# Patient Record
Sex: Male | Born: 1945 | Race: White | Hispanic: No | Smoking: Former smoker
Health system: Southern US, Community
[De-identification: ages and names within clinical notes are randomized; demographics above are authoritative.]

## PROBLEM LIST (undated history)

## (undated) DIAGNOSIS — I10 Essential (primary) hypertension: Secondary | ICD-10-CM

## (undated) DIAGNOSIS — I2510 Atherosclerotic heart disease of native coronary artery without angina pectoris: Secondary | ICD-10-CM

## (undated) DIAGNOSIS — E871 Hypo-osmolality and hyponatremia: Secondary | ICD-10-CM

## (undated) DIAGNOSIS — J449 Chronic obstructive pulmonary disease, unspecified: Secondary | ICD-10-CM

## (undated) DIAGNOSIS — R7989 Other specified abnormal findings of blood chemistry: Secondary | ICD-10-CM

## (undated) DIAGNOSIS — R040 Epistaxis: Secondary | ICD-10-CM

## (undated) DIAGNOSIS — K5790 Diverticulosis of intestine, part unspecified, without perforation or abscess without bleeding: Secondary | ICD-10-CM

## (undated) DIAGNOSIS — N200 Calculus of kidney: Secondary | ICD-10-CM

## (undated) DIAGNOSIS — Z7901 Long term (current) use of anticoagulants: Secondary | ICD-10-CM

## (undated) DIAGNOSIS — F1010 Alcohol abuse, uncomplicated: Secondary | ICD-10-CM

## (undated) DIAGNOSIS — D589 Hereditary hemolytic anemia, unspecified: Secondary | ICD-10-CM

## (undated) DIAGNOSIS — I5032 Chronic diastolic (congestive) heart failure: Secondary | ICD-10-CM

## (undated) DIAGNOSIS — I38 Endocarditis, valve unspecified: Secondary | ICD-10-CM

## (undated) DIAGNOSIS — N189 Chronic kidney disease, unspecified: Secondary | ICD-10-CM

## (undated) DIAGNOSIS — N183 Chronic kidney disease, stage 3 unspecified: Secondary | ICD-10-CM

## (undated) DIAGNOSIS — R945 Abnormal results of liver function studies: Secondary | ICD-10-CM

## (undated) DIAGNOSIS — I4891 Unspecified atrial fibrillation: Secondary | ICD-10-CM

## (undated) DIAGNOSIS — R7301 Impaired fasting glucose: Secondary | ICD-10-CM

## (undated) DIAGNOSIS — E785 Hyperlipidemia, unspecified: Secondary | ICD-10-CM

## (undated) DIAGNOSIS — Z720 Tobacco use: Secondary | ICD-10-CM

## (undated) DIAGNOSIS — K219 Gastro-esophageal reflux disease without esophagitis: Secondary | ICD-10-CM

## (undated) HISTORY — DX: Hereditary hemolytic anemia, unspecified: D58.9

## (undated) HISTORY — PX: INGUINAL HERNIA REPAIR: SHX194

## (undated) HISTORY — DX: Epistaxis: R04.0

## (undated) HISTORY — DX: Chronic kidney disease, unspecified: N18.9

## (undated) HISTORY — PX: OTHER SURGICAL HISTORY: SHX169

## (undated) HISTORY — DX: Tobacco use: Z72.0

## (undated) HISTORY — DX: Diverticulosis of intestine, part unspecified, without perforation or abscess without bleeding: K57.90

## (undated) HISTORY — DX: Other specified abnormal findings of blood chemistry: R79.89

## (undated) HISTORY — DX: Long term (current) use of anticoagulants: Z79.01

## (undated) HISTORY — DX: Endocarditis, valve unspecified: I38

## (undated) HISTORY — DX: Abnormal results of liver function studies: R94.5

## (undated) HISTORY — DX: Alcohol abuse, uncomplicated: F10.10

## (undated) HISTORY — DX: Impaired fasting glucose: R73.01

## (undated) HISTORY — DX: Hypo-osmolality and hyponatremia: E87.1

## (undated) HISTORY — DX: Calculus of kidney: N20.0

## (undated) HISTORY — PX: COLONOSCOPY: SHX174

---

## 2001-08-18 ENCOUNTER — Emergency Department (HOSPITAL_COMMUNITY): Admission: EM | Admit: 2001-08-18 | Discharge: 2001-08-18 | Payer: Self-pay | Admitting: Emergency Medicine

## 2001-08-19 ENCOUNTER — Emergency Department (HOSPITAL_COMMUNITY): Admission: EM | Admit: 2001-08-19 | Discharge: 2001-08-19 | Payer: Self-pay | Admitting: *Deleted

## 2001-10-31 ENCOUNTER — Ambulatory Visit (HOSPITAL_COMMUNITY): Admission: RE | Admit: 2001-10-31 | Discharge: 2001-10-31 | Payer: Self-pay | Admitting: Cardiology

## 2001-10-31 ENCOUNTER — Encounter: Payer: Self-pay | Admitting: Cardiology

## 2001-11-02 ENCOUNTER — Encounter (INDEPENDENT_AMBULATORY_CARE_PROVIDER_SITE_OTHER): Payer: Self-pay | Admitting: Specialist

## 2001-11-02 ENCOUNTER — Inpatient Hospital Stay (HOSPITAL_COMMUNITY): Admission: AD | Admit: 2001-11-02 | Discharge: 2001-11-16 | Payer: Self-pay | Admitting: *Deleted

## 2001-11-03 HISTORY — PX: CARDIAC VALVE REPLACEMENT: SHX585

## 2001-11-03 HISTORY — PX: CORONARY ARTERY BYPASS GRAFT: SHX141

## 2001-11-08 ENCOUNTER — Encounter (HOSPITAL_COMMUNITY): Payer: Self-pay | Admitting: Dentistry

## 2001-11-09 ENCOUNTER — Encounter: Payer: Self-pay | Admitting: Surgery

## 2001-11-10 ENCOUNTER — Encounter: Payer: Self-pay | Admitting: Surgery

## 2001-11-11 ENCOUNTER — Encounter: Payer: Self-pay | Admitting: Surgery

## 2001-11-12 ENCOUNTER — Encounter: Payer: Self-pay | Admitting: Surgery

## 2001-11-13 ENCOUNTER — Encounter: Payer: Self-pay | Admitting: Surgery

## 2001-11-14 ENCOUNTER — Encounter: Payer: Self-pay | Admitting: Surgery

## 2001-12-08 ENCOUNTER — Ambulatory Visit (HOSPITAL_COMMUNITY): Admission: RE | Admit: 2001-12-08 | Discharge: 2001-12-08 | Payer: Self-pay | Admitting: Cardiology

## 2001-12-19 ENCOUNTER — Encounter (HOSPITAL_COMMUNITY): Admission: RE | Admit: 2001-12-19 | Discharge: 2002-01-18 | Payer: Self-pay | Admitting: Internal Medicine

## 2002-01-03 HISTORY — PX: ESOPHAGOGASTRODUODENOSCOPY: SHX1529

## 2002-01-20 ENCOUNTER — Encounter (HOSPITAL_COMMUNITY): Admission: RE | Admit: 2002-01-20 | Discharge: 2002-02-19 | Payer: Self-pay | Admitting: Internal Medicine

## 2002-01-28 ENCOUNTER — Inpatient Hospital Stay (HOSPITAL_COMMUNITY): Admission: EM | Admit: 2002-01-28 | Discharge: 2002-02-03 | Payer: Self-pay | Admitting: Emergency Medicine

## 2002-01-28 ENCOUNTER — Encounter: Payer: Self-pay | Admitting: Emergency Medicine

## 2002-03-05 ENCOUNTER — Emergency Department (HOSPITAL_COMMUNITY): Admission: EM | Admit: 2002-03-05 | Discharge: 2002-03-05 | Payer: Self-pay | Admitting: Internal Medicine

## 2002-04-05 ENCOUNTER — Ambulatory Visit (HOSPITAL_COMMUNITY): Admission: RE | Admit: 2002-04-05 | Discharge: 2002-04-05 | Payer: Self-pay | Admitting: Internal Medicine

## 2002-04-10 ENCOUNTER — Ambulatory Visit (HOSPITAL_COMMUNITY): Admission: RE | Admit: 2002-04-10 | Discharge: 2002-04-10 | Payer: Self-pay | Admitting: Cardiology

## 2002-10-04 ENCOUNTER — Ambulatory Visit (HOSPITAL_COMMUNITY): Admission: RE | Admit: 2002-10-04 | Discharge: 2002-10-04 | Payer: Self-pay | Admitting: *Deleted

## 2003-07-03 ENCOUNTER — Ambulatory Visit (HOSPITAL_COMMUNITY): Admission: RE | Admit: 2003-07-03 | Discharge: 2003-07-03 | Payer: Self-pay | Admitting: Cardiology

## 2003-07-10 ENCOUNTER — Encounter: Admission: RE | Admit: 2003-07-10 | Discharge: 2003-07-10 | Payer: Self-pay | Admitting: Oncology

## 2003-07-10 ENCOUNTER — Encounter (HOSPITAL_COMMUNITY): Admission: RE | Admit: 2003-07-10 | Discharge: 2003-08-09 | Payer: Self-pay | Admitting: Oncology

## 2003-08-13 ENCOUNTER — Encounter (HOSPITAL_COMMUNITY): Admission: RE | Admit: 2003-08-13 | Discharge: 2003-09-12 | Payer: Self-pay | Admitting: Oncology

## 2003-08-13 ENCOUNTER — Encounter: Admission: RE | Admit: 2003-08-13 | Discharge: 2003-08-13 | Payer: Self-pay | Admitting: Oncology

## 2004-06-02 ENCOUNTER — Ambulatory Visit: Payer: Self-pay | Admitting: *Deleted

## 2004-06-17 ENCOUNTER — Ambulatory Visit: Payer: Self-pay | Admitting: Cardiology

## 2004-07-02 ENCOUNTER — Ambulatory Visit: Payer: Self-pay | Admitting: *Deleted

## 2004-11-04 ENCOUNTER — Ambulatory Visit: Payer: Self-pay | Admitting: *Deleted

## 2004-12-03 ENCOUNTER — Ambulatory Visit: Payer: Self-pay | Admitting: *Deleted

## 2004-12-10 ENCOUNTER — Ambulatory Visit: Payer: Self-pay | Admitting: Cardiology

## 2005-01-13 ENCOUNTER — Ambulatory Visit: Payer: Self-pay | Admitting: Cardiology

## 2005-02-16 ENCOUNTER — Ambulatory Visit: Payer: Self-pay | Admitting: Cardiology

## 2005-03-13 ENCOUNTER — Observation Stay (HOSPITAL_COMMUNITY): Admission: EM | Admit: 2005-03-13 | Discharge: 2005-03-14 | Payer: Self-pay | Admitting: Emergency Medicine

## 2005-03-16 ENCOUNTER — Ambulatory Visit: Payer: Self-pay | Admitting: *Deleted

## 2005-04-17 ENCOUNTER — Ambulatory Visit: Payer: Self-pay | Admitting: Cardiovascular Disease

## 2005-05-04 ENCOUNTER — Ambulatory Visit: Payer: Self-pay | Admitting: *Deleted

## 2005-05-04 ENCOUNTER — Ambulatory Visit: Payer: Self-pay | Admitting: Internal Medicine

## 2005-05-12 ENCOUNTER — Ambulatory Visit: Payer: Self-pay | Admitting: Internal Medicine

## 2005-05-29 ENCOUNTER — Ambulatory Visit: Payer: Self-pay | Admitting: *Deleted

## 2005-06-05 ENCOUNTER — Ambulatory Visit: Payer: Self-pay | Admitting: *Deleted

## 2005-06-19 ENCOUNTER — Ambulatory Visit: Payer: Self-pay | Admitting: *Deleted

## 2005-07-01 ENCOUNTER — Ambulatory Visit: Payer: Self-pay | Admitting: Urgent Care

## 2005-07-24 ENCOUNTER — Ambulatory Visit: Payer: Self-pay | Admitting: *Deleted

## 2005-08-27 ENCOUNTER — Ambulatory Visit: Payer: Self-pay | Admitting: Cardiology

## 2005-09-10 ENCOUNTER — Ambulatory Visit (HOSPITAL_COMMUNITY): Admission: RE | Admit: 2005-09-10 | Discharge: 2005-09-10 | Payer: Self-pay | Admitting: Internal Medicine

## 2005-09-25 ENCOUNTER — Ambulatory Visit: Payer: Self-pay | Admitting: *Deleted

## 2005-10-15 ENCOUNTER — Ambulatory Visit: Payer: Self-pay | Admitting: Cardiology

## 2005-11-12 ENCOUNTER — Ambulatory Visit: Payer: Self-pay | Admitting: *Deleted

## 2005-12-09 ENCOUNTER — Ambulatory Visit: Payer: Self-pay | Admitting: *Deleted

## 2006-01-08 ENCOUNTER — Ambulatory Visit: Payer: Self-pay | Admitting: *Deleted

## 2006-02-04 ENCOUNTER — Ambulatory Visit: Payer: Self-pay | Admitting: *Deleted

## 2006-03-11 ENCOUNTER — Ambulatory Visit: Payer: Self-pay | Admitting: Cardiology

## 2006-04-13 ENCOUNTER — Ambulatory Visit: Payer: Self-pay | Admitting: Cardiology

## 2006-05-18 ENCOUNTER — Ambulatory Visit: Payer: Self-pay | Admitting: Cardiology

## 2006-06-01 ENCOUNTER — Ambulatory Visit: Payer: Self-pay | Admitting: Cardiovascular Disease

## 2006-07-01 ENCOUNTER — Ambulatory Visit: Payer: Self-pay | Admitting: Cardiology

## 2006-08-05 ENCOUNTER — Ambulatory Visit: Payer: Self-pay | Admitting: Cardiology

## 2006-09-06 ENCOUNTER — Ambulatory Visit: Payer: Self-pay | Admitting: Cardiology

## 2006-09-23 ENCOUNTER — Ambulatory Visit: Payer: Self-pay | Admitting: Internal Medicine

## 2006-10-04 ENCOUNTER — Ambulatory Visit: Payer: Self-pay | Admitting: Internal Medicine

## 2006-10-11 ENCOUNTER — Ambulatory Visit: Payer: Self-pay | Admitting: Internal Medicine

## 2006-10-13 ENCOUNTER — Emergency Department (HOSPITAL_COMMUNITY): Admission: EM | Admit: 2006-10-13 | Discharge: 2006-10-13 | Payer: Self-pay | Admitting: Emergency Medicine

## 2006-11-10 ENCOUNTER — Ambulatory Visit: Payer: Self-pay | Admitting: Cardiovascular Disease

## 2006-12-08 ENCOUNTER — Ambulatory Visit: Payer: Self-pay | Admitting: Cardiology

## 2006-12-23 ENCOUNTER — Ambulatory Visit: Payer: Self-pay | Admitting: Cardiovascular Disease

## 2007-01-18 ENCOUNTER — Ambulatory Visit: Payer: Self-pay | Admitting: Internal Medicine

## 2007-01-20 ENCOUNTER — Ambulatory Visit: Payer: Self-pay | Admitting: Cardiology

## 2007-01-27 ENCOUNTER — Ambulatory Visit: Payer: Self-pay | Admitting: Cardiology

## 2007-02-10 ENCOUNTER — Ambulatory Visit: Payer: Self-pay | Admitting: Cardiology

## 2007-02-24 ENCOUNTER — Ambulatory Visit: Payer: Self-pay | Admitting: Cardiology

## 2007-03-08 ENCOUNTER — Ambulatory Visit: Payer: Self-pay | Admitting: Cardiovascular Disease

## 2007-04-08 ENCOUNTER — Ambulatory Visit: Payer: Self-pay | Admitting: Cardiology

## 2007-05-10 ENCOUNTER — Ambulatory Visit: Payer: Self-pay | Admitting: Cardiology

## 2007-05-25 ENCOUNTER — Ambulatory Visit: Payer: Self-pay | Admitting: Cardiology

## 2007-06-22 ENCOUNTER — Ambulatory Visit: Payer: Self-pay | Admitting: Cardiology

## 2007-07-21 ENCOUNTER — Ambulatory Visit: Payer: Self-pay | Admitting: Cardiology

## 2007-08-11 ENCOUNTER — Ambulatory Visit: Payer: Self-pay | Admitting: Cardiology

## 2007-10-07 ENCOUNTER — Ambulatory Visit: Payer: Self-pay | Admitting: Cardiology

## 2007-11-10 ENCOUNTER — Ambulatory Visit: Payer: Self-pay | Admitting: Cardiology

## 2007-12-08 ENCOUNTER — Ambulatory Visit: Payer: Self-pay | Admitting: Cardiology

## 2008-01-09 ENCOUNTER — Ambulatory Visit: Payer: Self-pay | Admitting: Cardiology

## 2008-02-03 ENCOUNTER — Ambulatory Visit: Payer: Self-pay | Admitting: Cardiology

## 2008-02-20 ENCOUNTER — Encounter: Payer: Self-pay | Admitting: Cardiology

## 2008-02-20 LAB — CONVERTED CEMR LAB
HCT: 39.1 %
Hemoglobin: 13.3 g/dL
MCV: 91 fL
Platelets: 338 10*3/uL
WBC: 7.5 10*3/uL

## 2008-03-05 ENCOUNTER — Ambulatory Visit: Payer: Self-pay | Admitting: Cardiology

## 2008-03-23 ENCOUNTER — Ambulatory Visit: Payer: Self-pay | Admitting: Cardiology

## 2008-04-02 ENCOUNTER — Ambulatory Visit: Payer: Self-pay | Admitting: Cardiology

## 2008-05-03 ENCOUNTER — Ambulatory Visit: Payer: Self-pay | Admitting: Cardiology

## 2008-05-14 ENCOUNTER — Ambulatory Visit: Payer: Self-pay | Admitting: Cardiology

## 2008-05-17 ENCOUNTER — Encounter: Payer: Self-pay | Admitting: Cardiology

## 2008-05-17 LAB — CONVERTED CEMR LAB
Alkaline Phosphatase: 127 units/L
CO2: 24 meq/L
Chloride: 97 meq/L
Cholesterol: 128 mg/dL
Glucose, Bld: 89 mg/dL
HDL: 47 mg/dL
LDL (calc): 62 mg/dL
Potassium: 5 meq/L
Sodium: 133 meq/L
Triglyceride fasting, serum: 95 mg/dL

## 2008-05-21 ENCOUNTER — Ambulatory Visit: Payer: Self-pay | Admitting: Cardiology

## 2008-06-11 ENCOUNTER — Ambulatory Visit: Payer: Self-pay | Admitting: Cardiology

## 2008-07-02 ENCOUNTER — Ambulatory Visit: Payer: Self-pay | Admitting: Cardiology

## 2008-07-30 ENCOUNTER — Ambulatory Visit: Payer: Self-pay | Admitting: Cardiology

## 2008-08-27 ENCOUNTER — Ambulatory Visit: Payer: Self-pay | Admitting: Cardiology

## 2008-09-27 ENCOUNTER — Ambulatory Visit: Payer: Self-pay

## 2008-11-01 ENCOUNTER — Ambulatory Visit: Payer: Self-pay | Admitting: Cardiology

## 2008-11-29 ENCOUNTER — Ambulatory Visit: Payer: Self-pay | Admitting: Cardiology

## 2008-12-20 ENCOUNTER — Ambulatory Visit: Payer: Self-pay | Admitting: Cardiology

## 2009-01-03 ENCOUNTER — Ambulatory Visit: Payer: Self-pay | Admitting: Cardiology

## 2009-01-24 ENCOUNTER — Ambulatory Visit: Payer: Self-pay | Admitting: Cardiology

## 2009-02-14 ENCOUNTER — Ambulatory Visit: Payer: Self-pay

## 2009-02-14 ENCOUNTER — Encounter: Payer: Self-pay | Admitting: Cardiology

## 2009-02-18 ENCOUNTER — Encounter: Payer: Self-pay | Admitting: *Deleted

## 2009-03-07 ENCOUNTER — Ambulatory Visit: Payer: Self-pay | Admitting: Cardiology

## 2009-03-07 LAB — CONVERTED CEMR LAB: POC INR: 3.5

## 2009-03-08 ENCOUNTER — Encounter: Payer: Self-pay | Admitting: Cardiology

## 2009-04-08 ENCOUNTER — Ambulatory Visit: Payer: Self-pay | Admitting: Cardiology

## 2009-04-08 LAB — CONVERTED CEMR LAB: POC INR: 2.8

## 2009-05-06 ENCOUNTER — Ambulatory Visit: Payer: Self-pay | Admitting: Cardiology

## 2009-05-06 LAB — CONVERTED CEMR LAB: POC INR: 4.2

## 2009-05-22 ENCOUNTER — Ambulatory Visit: Payer: Self-pay | Admitting: Cardiology

## 2009-05-22 LAB — CONVERTED CEMR LAB: POC INR: 4.1

## 2009-06-13 ENCOUNTER — Ambulatory Visit: Payer: Self-pay | Admitting: Cardiology

## 2009-06-13 LAB — CONVERTED CEMR LAB: POC INR: 2.2

## 2009-07-10 ENCOUNTER — Ambulatory Visit: Payer: Self-pay | Admitting: Cardiology

## 2009-07-10 LAB — CONVERTED CEMR LAB: POC INR: 4.2

## 2009-07-23 ENCOUNTER — Emergency Department (HOSPITAL_COMMUNITY): Admission: EM | Admit: 2009-07-23 | Discharge: 2009-07-24 | Payer: Self-pay | Admitting: Emergency Medicine

## 2009-07-24 ENCOUNTER — Emergency Department (HOSPITAL_COMMUNITY): Admission: EM | Admit: 2009-07-24 | Discharge: 2009-07-24 | Payer: Self-pay | Admitting: Emergency Medicine

## 2009-07-26 ENCOUNTER — Emergency Department (HOSPITAL_COMMUNITY): Admission: EM | Admit: 2009-07-26 | Discharge: 2009-07-26 | Payer: Self-pay | Admitting: Emergency Medicine

## 2009-07-31 ENCOUNTER — Ambulatory Visit: Payer: Self-pay | Admitting: Cardiology

## 2009-07-31 LAB — CONVERTED CEMR LAB: POC INR: 2.4

## 2009-08-01 ENCOUNTER — Emergency Department (HOSPITAL_COMMUNITY): Admission: EM | Admit: 2009-08-01 | Discharge: 2009-08-01 | Payer: Self-pay | Admitting: Emergency Medicine

## 2009-08-01 ENCOUNTER — Ambulatory Visit: Payer: Self-pay | Admitting: Cardiology

## 2009-08-01 ENCOUNTER — Inpatient Hospital Stay (HOSPITAL_COMMUNITY): Admission: EM | Admit: 2009-08-01 | Discharge: 2009-08-03 | Payer: Self-pay | Admitting: Emergency Medicine

## 2009-08-02 ENCOUNTER — Encounter (INDEPENDENT_AMBULATORY_CARE_PROVIDER_SITE_OTHER): Payer: Self-pay | Admitting: *Deleted

## 2009-08-02 LAB — CONVERTED CEMR LAB
BUN: 16 mg/dL
CO2: 25 meq/L
Calcium: 8.8 mg/dL
Chloride: 100 meq/L
Creatinine, Ser: 0.83 mg/dL
GFR calc non Af Amer: >60 mL/min
Glomerular Filtration Rate, Af Am: >60 mL/min/{1.73_m2}
Glucose, Bld: 96 mg/dL
Potassium: 3.7 meq/L
Sodium: 132 meq/L

## 2009-08-03 ENCOUNTER — Encounter (INDEPENDENT_AMBULATORY_CARE_PROVIDER_SITE_OTHER): Payer: Self-pay | Admitting: *Deleted

## 2009-08-03 LAB — CONVERTED CEMR LAB
BUN: 10 mg/dL
CO2: 25 meq/L
Calcium: 8.5 mg/dL
Chloride: 101 meq/L
Creatinine, Ser: 0.84 mg/dL
GFR calc non Af Amer: >60 mL/min
Glomerular Filtration Rate, Af Am: >60 mL/min/{1.73_m2}
Glucose, Bld: 107 mg/dL
Magnesium: 1.1 mg/dL
Potassium: 3.6 meq/L
Sodium: 133 meq/L

## 2009-08-07 ENCOUNTER — Ambulatory Visit: Payer: Self-pay | Admitting: Cardiology

## 2009-08-07 ENCOUNTER — Encounter (INDEPENDENT_AMBULATORY_CARE_PROVIDER_SITE_OTHER): Payer: Self-pay | Admitting: *Deleted

## 2009-08-07 LAB — CONVERTED CEMR LAB: POC INR: 1.8

## 2009-08-09 ENCOUNTER — Ambulatory Visit: Payer: Self-pay | Admitting: Cardiology

## 2009-08-09 LAB — CONVERTED CEMR LAB: POC INR: 2.2

## 2009-08-12 ENCOUNTER — Ambulatory Visit: Payer: Self-pay | Admitting: Cardiology

## 2009-08-12 LAB — CONVERTED CEMR LAB: POC INR: 2.9

## 2009-08-19 ENCOUNTER — Ambulatory Visit: Payer: Self-pay | Admitting: Cardiology

## 2009-08-19 ENCOUNTER — Encounter: Payer: Self-pay | Admitting: Adult Health

## 2009-08-19 LAB — CONVERTED CEMR LAB: POC INR: 1.8

## 2009-08-22 ENCOUNTER — Ambulatory Visit: Payer: Self-pay | Admitting: Cardiology

## 2009-08-22 LAB — CONVERTED CEMR LAB: POC INR: 2.8

## 2009-09-04 ENCOUNTER — Other Ambulatory Visit: Payer: Self-pay | Admitting: Emergency Medicine

## 2009-09-04 ENCOUNTER — Ambulatory Visit: Payer: Self-pay | Admitting: Internal Medicine

## 2009-09-04 ENCOUNTER — Encounter (INDEPENDENT_AMBULATORY_CARE_PROVIDER_SITE_OTHER): Payer: Self-pay | Admitting: *Deleted

## 2009-09-04 ENCOUNTER — Inpatient Hospital Stay (HOSPITAL_COMMUNITY): Admission: AD | Admit: 2009-09-04 | Discharge: 2009-09-05 | Payer: Self-pay | Admitting: Internal Medicine

## 2009-09-04 ENCOUNTER — Encounter: Payer: Self-pay | Admitting: Internal Medicine

## 2009-09-05 ENCOUNTER — Encounter: Payer: Self-pay | Admitting: Internal Medicine

## 2009-09-06 ENCOUNTER — Telehealth (INDEPENDENT_AMBULATORY_CARE_PROVIDER_SITE_OTHER): Payer: Self-pay | Admitting: *Deleted

## 2009-09-09 ENCOUNTER — Ambulatory Visit: Payer: Self-pay | Admitting: Cardiology

## 2009-09-09 LAB — CONVERTED CEMR LAB: POC INR: 2.1

## 2009-09-18 ENCOUNTER — Ambulatory Visit: Payer: Self-pay | Admitting: Cardiology

## 2009-09-18 LAB — CONVERTED CEMR LAB: POC INR: 3

## 2009-10-10 ENCOUNTER — Ambulatory Visit: Payer: Self-pay | Admitting: Cardiology

## 2009-10-10 LAB — CONVERTED CEMR LAB: POC INR: 2.4

## 2009-11-13 ENCOUNTER — Ambulatory Visit: Payer: Self-pay | Admitting: Cardiology

## 2009-11-13 LAB — CONVERTED CEMR LAB: POC INR: 2.5

## 2009-12-11 ENCOUNTER — Ambulatory Visit: Payer: Self-pay | Admitting: Cardiology

## 2009-12-11 LAB — CONVERTED CEMR LAB: POC INR: 4.1

## 2010-01-02 ENCOUNTER — Ambulatory Visit: Payer: Self-pay | Admitting: Cardiology

## 2010-01-02 LAB — CONVERTED CEMR LAB: POC INR: 1.9

## 2010-01-22 ENCOUNTER — Ambulatory Visit: Payer: Self-pay | Admitting: Cardiology

## 2010-01-22 LAB — CONVERTED CEMR LAB: POC INR: 2.5

## 2010-02-20 ENCOUNTER — Ambulatory Visit: Payer: Self-pay | Admitting: Cardiology

## 2010-02-20 DIAGNOSIS — Z952 Presence of prosthetic heart valve: Secondary | ICD-10-CM

## 2010-02-20 DIAGNOSIS — D649 Anemia, unspecified: Secondary | ICD-10-CM

## 2010-02-20 DIAGNOSIS — E785 Hyperlipidemia, unspecified: Secondary | ICD-10-CM

## 2010-02-20 DIAGNOSIS — K5730 Diverticulosis of large intestine without perforation or abscess without bleeding: Secondary | ICD-10-CM | POA: Insufficient documentation

## 2010-02-20 DIAGNOSIS — F17200 Nicotine dependence, unspecified, uncomplicated: Secondary | ICD-10-CM

## 2010-02-20 DIAGNOSIS — Z9889 Other specified postprocedural states: Secondary | ICD-10-CM

## 2010-02-20 DIAGNOSIS — J449 Chronic obstructive pulmonary disease, unspecified: Secondary | ICD-10-CM | POA: Insufficient documentation

## 2010-02-20 LAB — CONVERTED CEMR LAB: POC INR: 2.9

## 2010-02-24 ENCOUNTER — Encounter: Payer: Self-pay | Admitting: Cardiology

## 2010-02-24 ENCOUNTER — Encounter (INDEPENDENT_AMBULATORY_CARE_PROVIDER_SITE_OTHER): Payer: Self-pay | Admitting: *Deleted

## 2010-02-24 LAB — CONVERTED CEMR LAB
ALT: 14 units/L
AST: 26 units/L
Albumin: 4.7 g/dL
Alkaline Phosphatase: 112 units/L
BUN: 17 mg/dL
Basophils Absolute: 0 10*3/uL
Basophils Relative: 1 %
CO2: 24 meq/L
Calcium: 9.6 mg/dL
Chloride: 96 meq/L
Cholesterol: 191 mg/dL
Creatinine, Ser: 1 mg/dL
Eosinophils Absolute: 0.6 10*3/uL
Eosinophils Relative: 8 %
Glucose, Bld: 104 mg/dL
HCT: 35.3 %
HDL: 71 mg/dL
Hemoglobin: 12 g/dL
LDL Cholesterol: 14 mg/dL
Lymphocytes Relative: 10 %
Lymphs Abs: 0.7 10*3/uL
MCHC: 34 g/dL
MCV: 93.1 fL
Monocytes Absolute: 0.8 10*3/uL
Monocytes Relative: 11 %
Platelets: 324 10*3/uL
Potassium: 5.2 meq/L
RBC: 3.79 M/uL
RDW: 14 %
Sodium: 130 meq/L
Total Protein: 7.6 g/dL
Triglycerides: 72 mg/dL
WBC: 7.1 10*3/uL

## 2010-02-26 ENCOUNTER — Encounter (INDEPENDENT_AMBULATORY_CARE_PROVIDER_SITE_OTHER): Payer: Self-pay | Admitting: *Deleted

## 2010-02-26 LAB — CONVERTED CEMR LAB
ALT: 14 units/L (ref 0–53)
AST: 26 units/L (ref 0–37)
Albumin: 4.7 g/dL (ref 3.5–5.2)
Alkaline Phosphatase: 112 units/L (ref 39–117)
BUN: 17 mg/dL (ref 6–23)
Basophils Absolute: 0 10*3/uL (ref 0.0–0.1)
Basophils Relative: 1 % (ref 0–1)
CO2: 24 meq/L (ref 19–32)
Calcium: 9.6 mg/dL (ref 8.4–10.5)
Chloride: 96 meq/L (ref 96–112)
Cholesterol: 191 mg/dL (ref 0–200)
Creatinine, Ser: 1 mg/dL (ref 0.40–1.50)
Eosinophils Absolute: 0.6 10*3/uL (ref 0.0–0.7)
Eosinophils Relative: 8 % — ABNORMAL HIGH (ref 0–5)
Glucose, Bld: 104 mg/dL — ABNORMAL HIGH (ref 70–99)
HCT: 35.3 % — ABNORMAL LOW (ref 39.0–52.0)
HDL: 71 mg/dL (ref >39)
Hemoglobin: 12 g/dL — ABNORMAL LOW (ref 13.0–17.0)
LDL Cholesterol: 106 mg/dL — ABNORMAL HIGH (ref 0–99)
Lymphocytes Relative: 10 % — ABNORMAL LOW (ref 12–46)
Lymphs Abs: 0.7 10*3/uL (ref 0.7–4.0)
MCHC: 34 g/dL (ref 30.0–36.0)
MCV: 93.1 fL (ref 78.0–100.0)
Monocytes Absolute: 0.8 10*3/uL (ref 0.1–1.0)
Monocytes Relative: 11 % (ref 3–12)
Neutro Abs: 5.1 10*3/uL (ref 1.7–7.7)
Neutrophils Relative %: 71 % (ref 43–77)
Platelets: 324 10*3/uL (ref 150–400)
Potassium: 5.2 meq/L (ref 3.5–5.3)
RBC: 3.79 M/uL — ABNORMAL LOW (ref 4.22–5.81)
RDW: 14 % (ref 11.5–15.5)
Sodium: 130 meq/L — ABNORMAL LOW (ref 135–145)
Total Bilirubin: 0.5 mg/dL (ref 0.3–1.2)
Total CHOL/HDL Ratio: 2.7
Total Protein: 7.6 g/dL (ref 6.0–8.3)
Triglycerides: 72 mg/dL (ref <150)
VLDL: 14 mg/dL (ref 0–40)
WBC: 7.1 10*3/uL (ref 4.0–10.5)

## 2010-03-20 ENCOUNTER — Ambulatory Visit: Payer: Self-pay | Admitting: Cardiology

## 2010-03-20 LAB — CONVERTED CEMR LAB: POC INR: 2.4

## 2010-03-21 ENCOUNTER — Encounter (INDEPENDENT_AMBULATORY_CARE_PROVIDER_SITE_OTHER): Payer: Self-pay | Admitting: *Deleted

## 2010-03-21 LAB — CONVERTED CEMR LAB
OCCULT 1: NEGATIVE
OCCULT 2: NEGATIVE
OCCULT 3: NEGATIVE

## 2010-03-24 ENCOUNTER — Encounter (INDEPENDENT_AMBULATORY_CARE_PROVIDER_SITE_OTHER): Payer: Self-pay | Admitting: *Deleted

## 2010-03-28 ENCOUNTER — Encounter (INDEPENDENT_AMBULATORY_CARE_PROVIDER_SITE_OTHER): Payer: Self-pay | Admitting: *Deleted

## 2010-03-28 LAB — CONVERTED CEMR LAB
BUN: 13 mg/dL
Basophils Absolute: 0.1 10*3/uL
Basophils Relative: 1 %
CO2: 28 meq/L
Calcium: 10.1 mg/dL
Chloride: 92 meq/L
Creatinine, Ser: 0.87 mg/dL
Eosinophils Absolute: 0.3 10*3/uL
Eosinophils Relative: 4 %
Glucose, Bld: 109 mg/dL
HCT: 35.8 %
Hemoglobin: 12.6 g/dL
Lymphocytes Relative: 11 %
Lymphs Abs: 0.7 10*3/uL
MCHC: 35.2 g/dL
MCV: 92.7 fL
Monocytes Absolute: 0.7 10*3/uL
Monocytes Relative: 12 %
Platelets: 338 10*3/uL
Potassium: 5.5 meq/L
RBC: 3.86 M/uL
RDW: 13.6 %
Sodium: 130 meq/L
WBC: 6.3 10*3/uL

## 2010-03-31 ENCOUNTER — Encounter (INDEPENDENT_AMBULATORY_CARE_PROVIDER_SITE_OTHER): Payer: Self-pay | Admitting: *Deleted

## 2010-04-06 DIAGNOSIS — E875 Hyperkalemia: Secondary | ICD-10-CM

## 2010-04-06 DIAGNOSIS — E871 Hypo-osmolality and hyponatremia: Secondary | ICD-10-CM

## 2010-04-06 LAB — CONVERTED CEMR LAB
BUN: 13 mg/dL (ref 6–23)
Basophils Absolute: 0.1 10*3/uL (ref 0.0–0.1)
Basophils Relative: 1 % (ref 0–1)
CO2: 28 meq/L (ref 19–32)
Calcium: 10.1 mg/dL (ref 8.4–10.5)
Chloride: 92 meq/L — ABNORMAL LOW (ref 96–112)
Creatinine, Ser: 0.87 mg/dL (ref 0.40–1.50)
Eosinophils Absolute: 0.3 10*3/uL (ref 0.0–0.7)
Eosinophils Relative: 4 % (ref 0–5)
Glucose, Bld: 109 mg/dL — ABNORMAL HIGH (ref 70–99)
HCT: 35.8 % — ABNORMAL LOW (ref 39.0–52.0)
Hemoglobin: 12.6 g/dL — ABNORMAL LOW (ref 13.0–17.0)
Lymphocytes Relative: 11 % — ABNORMAL LOW (ref 12–46)
Lymphs Abs: 0.7 10*3/uL (ref 0.7–4.0)
MCHC: 35.2 g/dL (ref 30.0–36.0)
MCV: 92.7 fL (ref 78.0–100.0)
Monocytes Absolute: 0.7 10*3/uL (ref 0.1–1.0)
Monocytes Relative: 12 % (ref 3–12)
Neutro Abs: 4.6 10*3/uL (ref 1.7–7.7)
Neutrophils Relative %: 73 % (ref 43–77)
Platelets: 338 10*3/uL (ref 150–400)
Potassium: 5.5 meq/L — ABNORMAL HIGH (ref 3.5–5.3)
RBC: 3.86 M/uL — ABNORMAL LOW (ref 4.22–5.81)
RDW: 13.6 % (ref 11.5–15.5)
Sodium: 130 meq/L — ABNORMAL LOW (ref 135–145)
WBC: 6.3 10*3/uL (ref 4.0–10.5)

## 2010-04-07 ENCOUNTER — Encounter (INDEPENDENT_AMBULATORY_CARE_PROVIDER_SITE_OTHER): Payer: Self-pay | Admitting: *Deleted

## 2010-04-17 ENCOUNTER — Ambulatory Visit: Payer: Self-pay | Admitting: Cardiology

## 2010-04-17 LAB — CONVERTED CEMR LAB: POC INR: 2.2

## 2010-05-08 ENCOUNTER — Ambulatory Visit: Payer: Self-pay | Admitting: Cardiology

## 2010-05-08 LAB — CONVERTED CEMR LAB: POC INR: 3.2

## 2010-06-05 ENCOUNTER — Ambulatory Visit: Payer: Self-pay | Admitting: Cardiology

## 2010-06-05 LAB — CONVERTED CEMR LAB: POC INR: 2.6

## 2010-07-03 ENCOUNTER — Ambulatory Visit: Payer: Self-pay | Admitting: Cardiology

## 2010-07-31 ENCOUNTER — Ambulatory Visit: Admission: RE | Admit: 2010-07-31 | Discharge: 2010-07-31 | Payer: Self-pay | Source: Home / Self Care

## 2010-08-07 NOTE — Letter (Signed)
Summary: [name] Results Engineer, agricultural at [facility]  [address] [place] [address]   Phone: [phone]  Fax: [phone]      April 07, 2010 MRN: [id number]   [name] [address] [place], [place]  [address]   Dear Mr. DONATELLI,  Your test ordered by Selena Batten has been reviewed by your physician (or physician assistant) and was found to be normal or stable. Your physician (or physician assistant) felt no changes were needed at this time.  ____ Echocardiogram  ____ Cardiac Stress Test  __x__ Lab Work  ____ Peripheral vascular study of arms, legs or neck  ____ CT scan or X-ray  ____ Lung or Breathing test  ____ Other:  Please restrict the amount of potassium you consume in your diet.  Enclosed is a list of foods high in potassium to avoid in your diet.  We will repeat labwork in 2 months to check your levels again.  Also enclosed is a copy of your labwork for your records, per Dr. Dietrich Pates.   Thank you, Tammy Allyne Gee RN    [employer] Bing, MD, Lenise Arena.C.Gaylord Shih, MD, F.A.C.C Lewayne Bunting, MD, F.A.C.C Nona Dell, MD, F.A.C.C Charlton Haws, MD, Lenise Arena.C.C

## 2010-08-07 NOTE — Medication Information (Signed)
Summary: protime per checkout on 2/4/tg  Anticoagulant Therapy  Managed by: Vashti Hey, RN Supervising MD: Dietrich Pates MD, Molly Maduro Indication 1: Mitral Valve Replacement (ICD-V43.3) Indication 2: Aortic Valve Replacement (ICD-V43.3) Lab Used: [facility] [place] Site: [place] INR POC 2.9  Dietary changes: no    Health status changes: no    Bleeding/hemorrhagic complications: no    Recent/future hospitalizations: no    Any changes in medication regimen? no    Recent/future dental: no  Any missed doses?: no       Is patient compliant with meds? yes       Allergies: No Known Drug Allergies  Anticoagulation Management History:      The patient is taking warfarin and comes in today for a routine follow up visit.  Negative risk factors for bleeding include an age less than 41 years old.  The bleeding index is 'low risk'.  Negative CHADS2 values include Age > 33 years old.  The start date was 11/02/2001.  Anticoagulation responsible provider: Dietrich Pates MD, Molly Maduro.  INR POC: 2.9.  Cuvette Lot#: [id number].  Exp: 10/11.    Anticoagulation Management Assessment/Plan:      The patient's current anticoagulation dose is Warfarin sodium 5 mg tabs: Take as directed by [facility] Clinic.  The target INR is 2.5 - 3.5.  The next INR is due 08/19/2009.  Anticoagulation instructions were given to patient.  Results were reviewed/authorized by Vashti Hey, RN.  He was notified by Vashti Hey RN.         Prior Anticoagulation Instructions: INR 2.2 TODAY TAKE TWO TABLETS FRIDAY AND SATURDAY, THEN TAKE ONE TABLET ON SUNDAY AND RETURN ON MONDAY FOR RECHECK OF INR  Current Anticoagulation Instructions: INR 2.9 Resume regular dose of coumadin: 5mg  once daily except 7.5mg  on Mondays

## 2010-08-07 NOTE — Assessment & Plan Note (Signed)
Summary: U9W  Medications Added LISINOPRIL 20 MG TABS (LISINOPRIL) Take 1 tablet by mouth two times a day      Allergies Added: NKDA  Visit Type:  Follow-up Referring Provider:  [facility] Primary Provider:  Dr.Hawkins   History of Present Illness: [name] returns to the office as scheduled for continued assessment and treatment of valvular heart disease and coronary artery disease.  Since his last visit, he has done superbly.  Due to the heat, he did not keep a garden this summer, but has done yard work and other tasks without difficulty.  He denies dyspnea, orthopnea, PND, pedal edema, chest discomfort, lightheadedness and syncope.  He's had no headaches or, no abdominal pain and no neurologic symptoms.  He was admitted to hospital in March with recurrent epistaxis.  Dr. Annalee Genta has followed him for this and ultimately obtained control of the process with cautery.  There is mention in his prior records, which were obtained from [facility], of arterial ligation, but no surgical procedure was performed.  He has had no recurrent epistaxis since that admission.  The most recent laboratories available to me were from that time and demonstrated a normal chemistry profile but a moderately severe anemia with a hemoglobin of 9.  Current Medications (verified): 1)  Clonidine Hcl 0.1 Mg Tabs (Clonidine Hcl) .Marland Kitchen.. 1 Tab By Mouth Two Times A Day 2)  Warfarin Sodium 5 Mg Tabs (Warfarin Sodium) .... Take As Directed By Coumadin Clinic 3)  Lisinopril 20 Mg Tabs (Lisinopril) .... Take 1 Tablet By Mouth Two Times A Day 4)  Metoprolol Tartrate 50 Mg Tabs (Metoprolol Tartrate) .Marland Kitchen.. 1 Tablet By Mouth Two Times A Day 5)  Omeprazole 20 Mg Cpdr (Omeprazole) .... Take 1 Tab Two Times A Day 6)  Amlodipine Besylate 10 Mg Tabs (Amlodipine Besylate) .... Take 1 Tab Daily 7)  Aspir-Low 81 Mg Tbec (Aspirin) .... Take 1 Tab Daily 8)  Simvastatin 80 Mg Tabs (Simvastatin) .... Take 1/2 Tab Daily  Allergies  (verified): No Known Drug Allergies  Past History:  PMH, FH, and Social History reviewed and updated.  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  Vital Signs:  Patient profile:   65 year old male Weight:      166 pounds Pulse rate:   76 / minute BP sitting:   143 / 68  (right arm)  Vitals Entered By: Dreama Saa, CNA (February 20, 2010 1:29 PM)  Physical Exam  General:  Somewhat overweight; well developed; no acute distress:   Neck-No JVD; no carotid bruits: Lungs-prolonged expiratory phase; expiratory wheezes; no respiratory distress. 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  He is active and has had absolutely no problems with coronary disease or symptoms since his surgery.  Management of risk factors continues to be the most important aspect of his medical  management.  Problem # 2:  HYPERTENSION (ICD-401.9) Blood pressure control is suboptimal despite treatment with 4 drugs.  Chlorthalidone 12.5 mg q.d. will be added as #5.  Blood pressure will be checked in one month with a chemistry profile to be obtained at that time.  Problem # 3:  HYPERLIPIDEMIA (ICD-272.4) Patient's most recent lipid profile was somewhat suboptimal.  Pravastatin will be increased to 40 mg q.d. with a repeat lipid profile in one month.  Problem # 4:  MITRAL VALVE REPLACEMENT, HX OF (ICD-V15.1) Patient has 2 prosthetic valves and will require lifelong anticoagulation.  Epistaxis can certainly markedly complicate his medical therapy. His most recent CBC obtained immediately after his hospitalization revealed anemia-a followup study will be performed.  He will continue to followup with Dr. Annalee Genta as necessary.  I will see this nice gentleman again in one year.  Anticoagulation will be managed in our office until then.  Problem # 5:  TOBACCO ABUSE (ICD-305.1) We discussed tapering before completely discontinuing use of tobacco.  Patient is now prepared to commit to quitting cigarette smoking.  Other Orders: Hemoccult Cards (Take Home) (Hemoccult Cards) Future Orders: T-Comprehensive Metabolic Panel ([id number]) ... 02/24/2010 T-Lipid Profile [id number]) ... 02/24/2010 T-CBC w/Diff ([id number]) ... 02/24/2010  Patient Instructions: 1)  Your physician recommends that you schedule a follow-up appointment in: 1 year 2)  Your physician recommends that you return for lab work in: Monday 3)  Your physician has asked that you test your stool for blood. It is necessary to test 3 different stool specimens for accuracy. You will be given 3 hemoccult cards for specimen collection. For each stool specimen, place a small portion of stool sample (from 2 different areas of the stool) into the 2 squares on the card. Close card. Repeat with 2 more stool specimens. Bring the cards back to  the office for testing.

## 2010-08-07 NOTE — Medication Information (Signed)
Summary: CCR RESCEDULED FROM 10/31/2009/SN  Anticoagulant Therapy  Managed by: Vashti Hey, RN PCP: Dr.Hawkins Supervising MD: Diona Browner MD, Remi Deter Indication 1: Mitral Valve Replacement (ICD-V43.3) Indication 2: Aortic Valve Replacement (ICD-V43.3) Lab Used: [facility] [place] Site: [place] INR POC 2.5  Dietary changes: no    Health status changes: no    Bleeding/hemorrhagic complications: no    Recent/future hospitalizations: no    Any changes in medication regimen? no    Recent/future dental: no  Any missed doses?: no       Is patient compliant with meds? yes       Allergies: No Known Drug Allergies  Anticoagulation Management History:      The patient is taking warfarin and comes in today for a routine follow up visit.  Negative risk factors for bleeding include an age less than 52 years old.  The bleeding index is 'low risk'.  Positive CHADS2 values include History of HTN.  Negative CHADS2 values include Age > 40 years old.  The start date was 11/02/2001.  Anticoagulation responsible provider: Diona Browner MD, Remi Deter.  INR POC: 2.5.  Cuvette Lot#: [id number].  Exp: 10/11.    Anticoagulation Management Assessment/Plan:      The patient's current anticoagulation dose is Warfarin sodium 5 mg tabs: Take as directed by Coumadin Clinic.  The target INR is 2.5 - 3.5.  The next INR is due 12/11/2009.  Anticoagulation instructions were given to patient.  Results were reviewed/authorized by Vashti Hey, RN.  He was notified by Vashti Hey RN.         Prior Anticoagulation Instructions: INR 2.4 Increase coumadin to 5mg  once daily except 7.5mg  on Mondays and Thursdays  Current Anticoagulation Instructions: INR 2.5 Increase coumadin to 5mg  once daily except 7.5mg  on Mondays, Wednesdays and Fridays

## 2010-08-07 NOTE — Medication Information (Signed)
Summary: ccr-lr  Anticoagulant Therapy  Managed by: Vashti Hey, RN PCP: Dr.Hawkins Supervising MD: Diona Browner MD, Remi Deter Indication 1: Mitral Valve Replacement (ICD-V43.3) Indication 2: Aortic Valve Replacement (ICD-V43.3) Lab Used: [facility] [place] Site: [place] INR POC 4.1  Dietary changes: no    Health status changes: no    Bleeding/hemorrhagic complications: no    Recent/future hospitalizations: no    Any changes in medication regimen? no    Recent/future dental: no  Any missed doses?: no       Is patient compliant with meds? yes       Allergies: No Known Drug Allergies  Anticoagulation Management History:      The patient is taking warfarin and comes in today for a routine follow up visit.  Negative risk factors for bleeding include an age less than 106 years old.  The bleeding index is 'low risk'.  Positive CHADS2 values include History of HTN.  Negative CHADS2 values include Age > 70 years old.  The start date was 11/02/2001.  Anticoagulation responsible provider: Diona Browner MD, Remi Deter.  INR POC: 4.1.  Cuvette Lot#: [id number].  Exp: 10/11.    Anticoagulation Management Assessment/Plan:      The patient's current anticoagulation dose is Warfarin sodium 5 mg tabs: Take as directed by Coumadin Clinic.  The target INR is 2.5 - 3.5.  The next INR is due 01/02/2010.  Anticoagulation instructions were given to patient.  Results were reviewed/authorized by Vashti Hey, RN.  He was notified by Vashti Hey RN.         Prior Anticoagulation Instructions: INR 2.5 Increase coumadin to 5mg  once daily except 7.5mg  on Mondays, Wednesdays and Fridays  Current Anticoagulation Instructions: INR 4.1 Hold coumadin tonight then decrease dose to 5mg  once daily except 7.5mg  on Mondays and Fridays

## 2010-08-07 NOTE — Medication Information (Signed)
Summary: ccr-lr  Anticoagulant Therapy  Managed by: Vashti Hey, RN PCP: Dr.Hawkins Supervising MD: Dietrich Pates MD, Molly Maduro Indication 1: Mitral Valve Replacement (ICD-V43.3) Indication 2: Aortic Valve Replacement (ICD-V43.3) Lab Used: [facility] [place] Site: [place] INR POC 2.4  Dietary changes: no    Health status changes: no    Bleeding/hemorrhagic complications: no    Recent/future hospitalizations: no    Any changes in medication regimen? no    Recent/future dental: no  Any missed doses?: no       Is patient compliant with meds? yes       Allergies: No Known Drug Allergies  Anticoagulation Management History:      The patient is taking warfarin and comes in today for a routine follow up visit.  Negative risk factors for bleeding include an age less than 28 years old.  The bleeding index is 'low risk'.  Positive CHADS2 values include History of HTN.  Negative CHADS2 values include Age > 25 years old.  The start date was 11/02/2001.  Anticoagulation responsible provider: Dietrich Pates MD, Molly Maduro.  INR POC: 2.4.  Cuvette Lot#: [id number].  Exp: 10/11.    Anticoagulation Management Assessment/Plan:      The patient's current anticoagulation dose is Warfarin sodium 5 mg tabs: Take as directed by Coumadin Clinic.  The target INR is 2.5 - 3.5.  The next INR is due 10/31/2009.  Anticoagulation instructions were given to patient.  Results were reviewed/authorized by Vashti Hey, RN.  He was notified by Vashti Hey RN.         Prior Anticoagulation Instructions: INR 3.0 Continue coumadin 5mg  once daily except 7.5mg  on Mondays  Current Anticoagulation Instructions: INR 2.4 Increase coumadin to 5mg  once daily except 7.5mg  on Mondays and Thursdays

## 2010-08-07 NOTE — Medication Information (Signed)
Summary: ccr-lr  Anticoagulant Therapy  Managed by: [name] Hey, RN PCP: Dr.Hawkins Supervising MD: Dietrich Pates MD, Molly Maduro Indication 1: Mitral Valve Replacement (ICD-V43.3) Indication 2: Aortic Valve Replacement (ICD-V43.3) Lab Used: [facility] [place] Site: [place] INR POC 3.3  Dietary changes: no    Health status changes: no    Bleeding/hemorrhagic complications: no    Recent/future hospitalizations: no    Any changes in medication regimen? no    Recent/future dental: no  Any missed doses?: no       Is patient compliant with meds? yes       Allergies: No Known Drug Allergies  Anticoagulation Management History:      The patient is taking warfarin and comes in today for a routine follow up visit.  Positive risk factors for bleeding include an age of 66 years or older.  The bleeding index is 'intermediate risk'.  Positive CHADS2 values include History of HTN.  Negative CHADS2 values include Age > 67 years old.  The start date was 11/02/2001.  Anticoagulation responsible provider: Dietrich Pates MD, Molly Maduro.  INR POC: 3.3.  Cuvette Lot#: [id number].  Exp: 10/11.    Anticoagulation Management Assessment/Plan:      The patient's current anticoagulation dose is Warfarin sodium 5 mg tabs: 1 tablet once daily except 1 1/2 tablets on Mondays, Wednesdays and Fridays  or as directed by anticoagulation clinic.  The target INR is 2.5 - 3.5.  The next INR is due 08/28/2010.  Anticoagulation instructions were given to patient.  Results were reviewed/authorized by [name] Hey, RN.  He was notified by [name] Hey RN.         Prior Anticoagulation Instructions: INR 3.0 Continue coumadin 7.5mg  once daily except 5mg  on Sundays, Tuesdays and Thursdays  Current Anticoagulation Instructions: INR 3.3 Continue coumadin 7.5mg  once daily except 5mg  on Sundays, Tuesdays and Thursdays

## 2010-08-07 NOTE — Medication Information (Signed)
Summary: ccr-lr  Anticoagulant Therapy  Managed by: Vashti Hey, RN PCP: Dr.Hawkins Supervising MD: Dietrich Pates MD, Molly Maduro Indication 1: Mitral Valve Replacement (ICD-V43.3) Indication 2: Aortic Valve Replacement (ICD-V43.3) Lab Used: [facility] [place] Site: [place] INR POC 1.9  Dietary changes: no    Health status changes: no    Bleeding/hemorrhagic complications: no    Recent/future hospitalizations: no    Any changes in medication regimen? no    Recent/future dental: no  Any missed doses?: no       Is patient compliant with meds? yes       Allergies: No Known Drug Allergies  Anticoagulation Management History:      The patient is taking warfarin and comes in today for a routine follow up visit.  Negative risk factors for bleeding include an age less than 51 years old.  The bleeding index is 'low risk'.  Positive CHADS2 values include History of HTN.  Negative CHADS2 values include Age > 80 years old.  The start date was 11/02/2001.  Anticoagulation responsible provider: Dietrich Pates MD, Molly Maduro.  INR POC: 1.9.  Cuvette Lot#: [id number].  Exp: 10/11.    Anticoagulation Management Assessment/Plan:      The patient's current anticoagulation dose is Warfarin sodium 5 mg tabs: Take as directed by [facility]madin Clinic.  The target INR is 2.5 - 3.5.  The next INR is due 01/22/2010.  Anticoagulation instructions were given to patient.  Results were reviewed/authorized by Vashti Hey, RN.  He was notified by Vashti Hey RN.         Prior Anticoagulation Instructions: INR 4.1 Hold coumadin tonight then decrease dose to 5mg  once daily except 7.5mg  on Mondays and Fridays  Current Anticoagulation Instructions: INR 1.9 Take coumadin 2 tablets tonight and tomorrow night then increase dose to 1 tablet once daily except 1 1/2 tablets on Mondays, Wednesdays and Fridays

## 2010-08-07 NOTE — Medication Information (Signed)
Summary: post hosp protime/tg  Anticoagulant Therapy  Managed by: Vashti Hey, RN Supervising MD: Diona Browner MD, Remi Deter Indication 1: Mitral Valve Replacement (ICD-V43.3) Indication 2: Aortic Valve Replacement (ICD-V43.3) Lab Used: [facility] [place] Site: [place] INR POC 1.8  Dietary changes: no    Health status changes: no    Bleeding/hemorrhagic complications: yes       Details: persistant nose bleed  Recent/future hospitalizations: yes       Details: In [facility] from 08/01/09 to 08/03/09 for surgery for nose bleeds  Any changes in medication regimen? yes       Details: pt started on Lovenox 80mg  bid till INR theraputic  Recent/future dental: no  Any missed doses?: yes     Details: was off in hospital for nasal surgery  Is patient compliant with meds? yes      Comments: On Lovenox bridge.  Has 3 shots left  Anticoagulation Management History:      The patient is taking warfarin and comes in today for a routine follow up visit.  Negative risk factors for bleeding include an age less than 35 years old.  The bleeding index is 'low risk'.  Negative CHADS2 values include Age > 21 years old.  The start date was 11/02/2001.  Anticoagulation responsible provider: Diona Browner MD, Remi Deter.  INR POC: 1.8.  Cuvette Lot#: [id number].  Exp: 10/11.    Anticoagulation Management Assessment/Plan:      The patient's current anticoagulation dose is Warfarin sodium 5 mg tabs: Take as directed by Coumadin Clinic.  The target INR is 2.5 - 3.5.  The next INR is due 08/09/2009.  Anticoagulation instructions were given to patient.  Results were reviewed/authorized by Vashti Hey, RN.  He was notified by Vashti Hey RN.         Prior Anticoagulation Instructions: INR 2.4 Take coumadin 2 tablets tonight then resume 1 tablet once daily except 1 1/2 tablets on Mondays  Current Anticoagulation Instructions: INR 1.8 Pt to continue Lovenox 80mg  subcutaneously two times a day  Pt to take  couamdin 10mg  tonight and tomorrow night and come for repeat INR on 08/09/09

## 2010-08-07 NOTE — Medication Information (Signed)
Summary: ccr-lr  Anticoagulant Therapy  Managed by: Vashti Hey, RN PCP: Dr.Hawkins Supervising MD: Dietrich Pates MD, Molly Maduro Indication 1: Mitral Valve Replacement (ICD-V43.3) Indication 2: Aortic Valve Replacement (ICD-V43.3) Lab Used: [facility] [place] Site: [place] INR POC 2.9  Dietary changes: no    Health status changes: no    Bleeding/hemorrhagic complications: no    Recent/future hospitalizations: no    Any changes in medication regimen? no    Recent/future dental: no  Any missed doses?: no       Is patient compliant with meds? yes       Allergies: No Known Drug Allergies  Anticoagulation Management History:      The patient is taking warfarin and comes in today for a routine follow up visit.  Negative risk factors for bleeding include an age less than 36 years old.  The bleeding index is 'low risk'.  Positive CHADS2 values include History of HTN.  Negative CHADS2 values include Age > 35 years old.  The start date was 11/02/2001.  Anticoagulation responsible provider: Dietrich Pates MD, Molly Maduro.  INR POC: 2.9.  Exp: 10/11.    Anticoagulation Management Assessment/Plan:      The patient's current anticoagulation dose is Warfarin sodium 5 mg tabs: Take as directed by Coumadin Clinic.  The target INR is 2.5 - 3.5.  The next INR is due 03/20/2010.  Anticoagulation instructions were given to patient.  Results were reviewed/authorized by Vashti Hey, RN.  He was notified by Vashti Hey RN.         Prior Anticoagulation Instructions: INR 2.5 Take coumadin 10mg  tonight then resume 5mg  once daily except 7.5mg  on M,W,F  Current Anticoagulation Instructions: INR 2.9 Continue coumadin 5mg  once daily except 7.5mg  on Mondays, Wednesdays and Fridays

## 2010-08-07 NOTE — Medication Information (Signed)
Summary: PROTIME/TG  Anticoagulant Therapy  Managed by: Vashti Hey, RN PCP: Dr.Hawkins Supervising MD: Daleen Squibb MD, Maisie Fus Indication 1: Mitral Valve Replacement (ICD-V43.3) Indication 2: Aortic Valve Replacement (ICD-V43.3) Lab Used: [facility] [place] Site: [place] INR POC 2.1  Dietary changes: no    Health status changes: no    Bleeding/hemorrhagic complications: yes       Details: nose bleed requiring hospitalization with balloon placement  Recent/future hospitalizations: yes       Details: S/P [facility] for nose bleed  Any changes in medication regimen? no    Recent/future dental: no  Any missed doses?: yes     Details: has been on coumadin 5mg  qd since discharge   Is patient compliant with meds? yes       Allergies: No Known Drug Allergies  Anticoagulation Management History:      The patient is taking warfarin and comes in today for a routine follow up visit.  Negative risk factors for bleeding include an age less than 29 years old.  The bleeding index is 'low risk'.  Positive CHADS2 values include History of HTN.  Negative CHADS2 values include Age > 64 years old.  The start date was 11/02/2001.  Anticoagulation responsible provider: Daleen Squibb MD, Maisie Fus.  INR POC: 2.1.  Cuvette Lot#: [id number].  Exp: 10/11.    Anticoagulation Management Assessment/Plan:      The patient's current anticoagulation dose is Warfarin sodium 5 mg tabs: Take as directed by Coumadin Clinic.  The target INR is 2.5 - 3.5.  The next INR is due 09/18/2009.  Anticoagulation instructions were given to patient.  Results were reviewed/authorized by Vashti Hey, RN.  He was notified by Vashti Hey RN.         Prior Anticoagulation Instructions: INR 2.8 Continue coumadin 5mg  once daily except 7.5mg  on Mondays  Current Anticoagulation Instructions: INR 2.1 Take coumadin 2 tablets tonight, 1 1/2 tablets tomorrow night then resume 1 tablet once daily except 1 1/2 tablets on Mondays

## 2010-08-07 NOTE — Medication Information (Signed)
Summary: ccr-lr  Anticoagulant Therapy  Managed by: Vashti Hey, RN PCP: Dr.Hawkins Supervising MD: Dietrich Pates MD, Molly Maduro Indication 1: Mitral Valve Replacement (ICD-V43.3) Indication 2: Aortic Valve Replacement (ICD-V43.3) Lab Used: [facility] [place] Site: [place] INR POC 2.2  Dietary changes: no    Health status changes: no    Bleeding/hemorrhagic complications: no    Recent/future hospitalizations: no    Any changes in medication regimen? no    Recent/future dental: no  Any missed doses?: no       Is patient compliant with meds? yes       Allergies: No Known Drug Allergies  Anticoagulation Management History:      The patient is taking warfarin and comes in today for a routine follow up visit.  Negative risk factors for bleeding include an age less than 66 years old.  The bleeding index is 'low risk'.  Positive CHADS2 values include History of HTN.  Negative CHADS2 values include Age > 41 years old.  The start date was 11/02/2001.  Anticoagulation responsible provider: Dietrich Pates MD, Molly Maduro.  INR POC: 2.2.  Cuvette Lot#: [id number].  Exp: 10/11.    Anticoagulation Management Assessment/Plan:      The patient's current anticoagulation dose is Warfarin sodium 5 mg tabs: 1 tablet once daily except 1 1/2 tablets on Mondays, Wednesdays and Fridays  or as directed by anticoagulation clinic.  The target INR is 2.5 - 3.5.  The next INR is due 05/08/2010.  Anticoagulation instructions were given to patient.  Results were reviewed/authorized by Vashti Hey, RN.  He was notified by Vashti Hey RN.         Prior Anticoagulation Instructions: INR 2.4 Take coumadin 2 tablets tonight then resume 1 tablet once daily except 1 1/2 tablets on Mondays, Wednesdays and Fridays  Current Anticoagulation Instructions: INR 2.2 Take coumadin 1  1/2 tablets tonight then increase dose to 1 1/2 tablets once daily except 1 tablet on Sundays, Tuesdays and Thursdays

## 2010-08-07 NOTE — Medication Information (Signed)
Summary: ccr-lr  Anticoagulant Therapy  Managed by: Vashti Hey, RN PCP: Dr.Hawkins Supervising MD: Daleen Squibb MD, Maisie Fus Indication 1: Mitral Valve Replacement (ICD-V43.3) Indication 2: Aortic Valve Replacement (ICD-V43.3) Lab Used: [facility] [place] Site: [place] INR POC 2.5  Dietary changes: no    Health status changes: no    Bleeding/hemorrhagic complications: no    Recent/future hospitalizations: no    Any changes in medication regimen? no    Recent/future dental: no  Any missed doses?: no       Is patient compliant with meds? yes       Allergies: No Known Drug Allergies  Anticoagulation Management History:      The patient is taking warfarin and comes in today for a routine follow up visit.  Negative risk factors for bleeding include an age less than 53 years old.  The bleeding index is 'low risk'.  Positive CHADS2 values include History of HTN.  Negative CHADS2 values include Age > 19 years old.  The start date was 11/02/2001.  Anticoagulation responsible provider: Daleen Squibb MD, Maisie Fus.  INR POC: 2.5.  Cuvette Lot#: [id number].  Exp: 10/11.    Anticoagulation Management Assessment/Plan:      The patient's current anticoagulation dose is Warfarin sodium 5 mg tabs: Take as directed by Coumadin Clinic.  The target INR is 2.5 - 3.5.  The next INR is due 02/19/2010.  Anticoagulation instructions were given to patient.  Results were reviewed/authorized by Vashti Hey, RN.  He was notified by Vashti Hey RN.         Prior Anticoagulation Instructions: INR 1.9 Take coumadin 2 tablets tonight and tomorrow night then increase dose to 1 tablet once daily except 1 1/2 tablets on Mondays, Wednesdays and Fridays  Current Anticoagulation Instructions: INR 2.5 Take coumadin 10mg  tonight then resume 5mg  once daily except 7.5mg  on M,W,F

## 2010-08-07 NOTE — Medication Information (Signed)
Summary: ccr-lr  Anticoagulant Therapy  Managed by: Vashti Hey, RN PCP: Dr.Hawkins Supervising MD: Dietrich Pates MD, Molly Maduro Indication 1: Mitral Valve Replacement (ICD-V43.3) Indication 2: Aortic Valve Replacement (ICD-V43.3) Lab Used: [facility] [place] Site: [place] INR POC 2.4  Dietary changes: no    Health status changes: no    Bleeding/hemorrhagic complications: no    Recent/future hospitalizations: no    Any changes in medication regimen? no    Recent/future dental: no  Any missed doses?: no       Is patient compliant with meds? yes       Allergies: No Known Drug Allergies  Anticoagulation Management History:      The patient is taking warfarin and comes in today for a routine follow up visit.  Negative risk factors for bleeding include an age less than 82 years old.  The bleeding index is 'low risk'.  Positive CHADS2 values include History of HTN.  Negative CHADS2 values include Age > 31 years old.  The start date was 11/02/2001.  Anticoagulation responsible provider: Dietrich Pates MD, Molly Maduro.  INR POC: 2.4.  Cuvette Lot#: [id number].  Exp: 10/11.    Anticoagulation Management Assessment/Plan:      The patient's current anticoagulation dose is Warfarin sodium 5 mg tabs: Take as directed by Coumadin Clinic.  The target INR is 2.5 - 3.5.  The next INR is due 04/17/2010.  Anticoagulation instructions were given to patient.  Results were reviewed/authorized by Vashti Hey, RN.  He was notified by Vashti Hey RN.         Prior Anticoagulation Instructions: INR 2.9 Continue coumadin 5mg  once daily except 7.5mg  on Mondays, Wednesdays and Fridays  Current Anticoagulation Instructions: INR 2.4 Take coumadin 2 tablets tonight then resume 1 tablet once daily except 1 1/2 tablets on Mondays, Wednesdays and Fridays

## 2010-08-07 NOTE — Medication Information (Signed)
Summary: ccr-lr  Anticoagulant Therapy  Managed by: Vashti Hey, RN Supervising MD: Diona Browner MD, Remi Deter Indication 1: Mitral Valve Replacement (ICD-V43.3) Indication 2: Aortic Valve Replacement (ICD-V43.3) Lab Used: [facility] [place] Site: [place] INR POC 2.4  Dietary changes: no    Health status changes: no    Bleeding/hemorrhagic complications: yes       Details: severe nose bleed   came to ED  Nose was packed and received 2 units blood  INR within range  Recent/future hospitalizations: no    Any changes in medication regimen? no    Recent/future dental: no  Any missed doses?: no       Is patient compliant with meds? yes       Anticoagulation Management History:      The patient is taking warfarin and comes in today for a routine follow up visit.  Negative risk factors for bleeding include an age less than 60 years old.  The bleeding index is 'low risk'.  Negative CHADS2 values include Age > 37 years old.  The start date was 11/02/2001.  Anticoagulation responsible provider: Diona Browner MD, Remi Deter.  INR POC: 2.4.  Cuvette Lot#: [id number].  Exp: 10/11.    Anticoagulation Management Assessment/Plan:      The patient's current anticoagulation dose is Warfarin sodium 5 mg tabs: Take as directed by Coumadin Clinic.  The target INR is 2.5 - 3.5.  The next INR is due 08/14/2009.  Anticoagulation instructions were given to patient.  Results were reviewed/authorized by Vashti Hey, RN.  He was notified by Vashti Hey RN.         Prior Anticoagulation Instructions: INR 4.2 Hold coumadin today then decrease dose to 5mg  once daily except 7.5mg  on Mondays  Current Anticoagulation Instructions: INR 2.4 Take coumadin 2 tablets tonight then resume 1 tablet once daily except 1 1/2 tablets on Mondays

## 2010-08-07 NOTE — Miscellaneous (Signed)
Summary: hemoccult cards   Clinical Lists Changes  Observations: Added new observation of HEMOCCULT 3: neg (03/21/2010 8:59) Added new observation of HEMOCCULT 2: neg (03/21/2010 8:59) Added new observation of HEMOCCULT 1: neg (03/21/2010 8:59)

## 2010-08-07 NOTE — Medication Information (Signed)
Summary: ccr-lr  Anticoagulant Therapy  Managed by: Vashti Hey, RN PCP: Dr.Hawkins Supervising MD: Daleen Squibb MD, Maisie Fus Indication 1: Mitral Valve Replacement (ICD-V43.3) Indication 2: Aortic Valve Replacement (ICD-V43.3) Lab Used: [facility] [place] Site: [place] INR POC 3.0  Dietary changes: no    Health status changes: no    Bleeding/hemorrhagic complications: no    Recent/future hospitalizations: no    Any changes in medication regimen? no    Recent/future dental: no  Any missed doses?: no       Is patient compliant with meds? yes       Allergies: No Known Drug Allergies  Anticoagulation Management History:      The patient is taking warfarin and comes in today for a routine follow up visit.  Negative risk factors for bleeding include an age less than 45 years old.  The bleeding index is 'low risk'.  Positive CHADS2 values include History of HTN.  Negative CHADS2 values include Age > 69 years old.  The start date was 11/02/2001.  Anticoagulation responsible provider: Daleen Squibb MD, Maisie Fus.  INR POC: 3.0.  Cuvette Lot#: [id number].  Exp: 10/11.    Anticoagulation Management Assessment/Plan:      The patient's current anticoagulation dose is Warfarin sodium 5 mg tabs: Take as directed by Coumadin Clinic.  The target INR is 2.5 - 3.5.  The next INR is due 10/10/2009.  Anticoagulation instructions were given to patient.  Results were reviewed/authorized by Vashti Hey, RN.  He was notified by Vashti Hey RN.         Prior Anticoagulation Instructions: INR 2.1 Take coumadin 2 tablets tonight, 1 1/2 tablets tomorrow night then resume 1 tablet once daily except 1 1/2 tablets on Mondays  Current Anticoagulation Instructions: INR 3.0 Continue coumadin 5mg  once daily except 7.5mg  on Mondays

## 2010-08-07 NOTE — Miscellaneous (Signed)
Summary: HOSP LABS 08/02/2009-08/03/2009  Clinical Lists Changes  Observations: Added new observation of MAGNESIUM: 1.1 mg/dL (81/19/1478 29:56) Added new observation of CALCIUM: 8.5 mg/dL (21/30/8657 84:69) Added new observation of GFR AA: >60 mL/min/1.79m2 (08/03/2009 13:48) Added new observation of GFR: >60 mL/min (08/03/2009 13:48) Added new observation of CREATININE: 0.84 mg/dL (62/95/2841 32:44) Added new observation of BUN: 10 mg/dL (07/08/7251 66:44) Added new observation of BG RANDOM: 107 mg/dL (03/47/4259 56:38) Added new observation of CO2 PLSM/SER: 25 meq/L (08/03/2009 13:48) Added new observation of CL SERUM: 101 meq/L (08/03/2009 13:48) Added new observation of K SERUM: 3.6 meq/L (08/03/2009 13:48) Added new observation of NA: 133 meq/L (08/03/2009 13:48) Added new observation of CALCIUM: 8.8 mg/dL (75/64/3329 51:88) Added new observation of GFR AA: >60 mL/min/1.31m2 (08/02/2009 13:48) Added new observation of GFR: >60 mL/min (08/02/2009 13:48) Added new observation of CREATININE: 0.83 mg/dL (41/66/0630 16:01) Added new observation of BUN: 16 mg/dL (09/32/3557 32:20) Added new observation of BG RANDOM: 96 mg/dL (25/42/7062 37:62) Added new observation of CO2 PLSM/SER: 25 meq/L (08/02/2009 13:48) Added new observation of CL SERUM: 100 meq/L (08/02/2009 13:48) Added new observation of K SERUM: 3.7 meq/L (08/02/2009 13:48) Added new observation of NA: 132 meq/L (08/02/2009 13:48)

## 2010-08-07 NOTE — Medication Information (Signed)
Summary: ccr-lr  Anticoagulant Therapy  Managed by: Vashti Hey, RN PCP: Dr.Hawkins Supervising MD: Daleen Squibb MD, Maisie Fus Indication 1: Mitral Valve Replacement (ICD-V43.3) Indication 2: Aortic Valve Replacement (ICD-V43.3) Lab Used: [facility] [place] Site: [place] INR POC 2.6  Dietary changes: no    Health status changes: no    Bleeding/hemorrhagic complications: no    Recent/future hospitalizations: no    Any changes in medication regimen? no    Recent/future dental: no  Any missed doses?: no       Is patient compliant with meds? yes       Allergies: No Known Drug Allergies  Anticoagulation Management History:      The patient is taking warfarin and comes in today for a routine follow up visit.  Positive risk factors for bleeding include an age of 66 years or older.  The bleeding index is 'intermediate risk'.  Positive CHADS2 values include History of HTN.  Negative CHADS2 values include Age > 81 years old.  The start date was 11/02/2001.  Anticoagulation responsible provider: Daleen Squibb MD, Maisie Fus.  INR POC: 2.6.  Cuvette Lot#: [id number].  Exp: 10/11.    Anticoagulation Management Assessment/Plan:      The patient's current anticoagulation dose is Warfarin sodium 5 mg tabs: 1 tablet once daily except 1 1/2 tablets on Mondays, Wednesdays and Fridays  or as directed by anticoagulation clinic.  The target INR is 2.5 - 3.5.  The next INR is due 07/03/2010.  Anticoagulation instructions were given to patient.  Results were reviewed/authorized by Vashti Hey, RN.  He was notified by Vashti Hey RN.         Prior Anticoagulation Instructions: INR 3.2 Continue coumadin 7.5mg  once daily except 5mg  on Sundays, Tuesdays and Thursdays  Current Anticoagulation Instructions: INR 2.6 Continue coumadin 7.5mg  once daily except 5mg  on Sundays, Tuesdays and Thursdays

## 2010-08-07 NOTE — Progress Notes (Signed)
Summary: Medication Concern   Phone Note Call from Patient   Caller: Patient Reason for Call: Talk to Nurse Summary of Call: pt states he went to hospital yesterday for nose bleed/pt on coumadin/pt states that he was instructed to stop Clonidine/wants to speak to Dr.Rothbart's nurse before doing so/tg Initial call taken by: Raechel Ache [facility],  September 06, 2009 10:34 AM  Follow-up for Phone Call        Discussed ED instructions to hold chlorthalidone not clonidine for hyponatremia until he is seen by pcp monday 09/09/2009 Follow-up by: Teressa Lower RN,  September 06, 2009 1:12 PM

## 2010-08-07 NOTE — Miscellaneous (Signed)
Summary: Hospital Admission  INTERNAL MEDICINE ADMISSION HISTORY AND PHYSICAL  PCP:  Ramon Dredge L. Juanetta Gosling, M.D. in [place], [place].     CARDIOLOGIST:  Gerrit Friends. Dietrich Pates, MD, [facility] with [facility].  CC: Epistaxis  HPI:  The patient is a 65 year old Caucasian male who has a past medical history of coronary artery disease, valve  replacement surgery on coumadin, hypertension, and recurrent nosebleeds as of early January. Patient reports that in the morning of admission we woke up and his right nostril started to bleed. Bleeding was described as gushing. He went to [facility] ED and there after having a ballon place in right nostril bleeding almost completely resolved. Patient was transferred to [facility] for further evaluation and management. Reports only a small amount of blood from right nostril. Denies chest pain, palpitations, diaphoresis, headache, blurry vision, slurring of speech, weakness.  ALLERGIES: NKDA  PAST MEDICAL HISTORY: 1) Coronary artery disease 2) Valve replacement surgery metallic aortic and mitral valves (2003) 3)  Hypertension 4) Hyponatremia  Surgical History:  Endocopic sphenopalatine artery ligation and cautry  MEDICATIONS: CHLORTHALIDONE 25 MG TABS (CHLORTHALIDONE) take 1/2 tablet once daily CLONIDINE HCL 0.1 MG TABS (CLONIDINE HCL) 1 tab by mouth two times a day WARFARIN SODIUM 5 MG TABS (WARFARIN SODIUM) Take as directed by Coumadin Clinic LISINOPRIL 20 MG TABS (LISINOPRIL) Take 1 tablet by mouth two times a day METOPROLOL TARTRATE 50 MG TABS (METOPROLOL TARTRATE) 1 tablet by mouth two times a day OMEPRAZOLE 20 MG CPDR (OMEPRAZOLE) take 1 tab two times a day AMLODIPINE BESYLATE 10 MG TABS (AMLODIPINE BESYLATE) take 1 tab daily ASPIR-LOW 81 MG TBEC (ASPIRIN) take 1 tab daily SIMVASTATIN 80 MG TABS (SIMVASTATIN) take 1/2 tab daily   SOCIAL HISTORY: Smoking history: 1 pack per day X 30+ years.  Drink 6 pack of beer daily.    FAMILY  HISTORY: Lives in [place]. Widowed. Has one daughter.  ROS: Per HPI  VITALS:  T: 97.5   P: 73  BP: 120/61  R:18  O2SAT: 99 ON:ra  PHYSICAL EXAM:  Gen: Patient is in NAD, Pleasant. Eyes: PERRL, EOMI ENT: ballon in right nostril, small amount of blood around right nostril, OP clear, No erythema, thrush or exudates. Neck: Supple, No carotid Bruits, No JVD, No thyromegaly Resp: CTA- Bilaterally, No W/C/R. CVS: Systolic click RRR, No R/G GI: Abdomen is soft. ND, NT, NG, NR, BS+. No organomegaly.       Rectal vault is empty, normal stool, no visible blood, Guaics ngetaive.  Ext: No pedal edema, cyanosis or clubbing. GU: No CVA tenderness. Skin: No visible rashes, scars. Lymph: No palpable lymphadenopathy. MS: Moving all 4 extremities. Neuro: A&O X3, CN II - XII are grossly intact. Motor strength is 5/5 in the all 4 extremities, Sensations intact to light touch, Gait normal, Cerebellar signs negative. Psych: Appropriate   LABS:  WBC                                      9.3               4.0-10.5         K/uL  RBC                                      3.15       l  4.22-5.81        MIL/uL  Hemoglobin (HGB)                         10.5       l      13.0-17.0        g/dL  Hematocrit (HCT)                         29.7       l      39.0-52.0        %  MCV                                      94.2              78.0-100.0       fL  MCHC                                     35.3              30.0-36.0        g/dL  RDW                                      14.1              11.5-15.5        %  Platelet Count (PLT)                     299               150-400          K/uL  Neutrophils, %                           87         h      43-77            %  Lymphocytes, %                           5          l      12-46            %  Monocytes, %                             5                 3-12             %  Eosinophils, %                           3                 0-5              %   Basophils, %  0                 0-1              %  Neutrophils, Absolute                    8.1        h      1.7-7.7          K/uL  Lymphocytes, Absolute                    0.4        l      0.7-4.0          K/uL  Monocytes, Absolute                      0.5               0.1-1.0          K/uL  Eosinophils, Absolute                    0.2               0.0-0.7          K/uL  Basophils, Absolute                      0.0               0.0-0.1          K/uL  Sodium (NA)                              126        l      135-145          mEq/L  Potassium (K)                            4.4               3.5-5.1          mEq/L  Chloride                                 95         l      96-112           mEq/L  CO2                                      22                19-32            mEq/L  Glucose                                  133        h      70-99            mg/dL  BUN  21                6-23             mg/dL  Creatinine                               1.56       h      0.4-1.5          mg/dL  GFR, Est Non African American            45         l      >60              mL/min  GFR, Est African American                54         l      >60              mL/min    Oversized comment, see footnote  1  Calcium                                  9.0               8.4-10.5         mg/dL Bilirubin, Total                         0.7               0.3-1.2          mg/dL  Bilirubin, Direct                        0.1               0.0-0.3          mg/dL  Indirect Bilirubin                       0.6               0.3-0.9          mg/dL  Alkaline Phosphatase                     98                39-117           U/L  SGOT (AST)                               24                0-37             U/L  SGPT (ALT)                               14                0-53             U/L  Total  Protein  7.0               6.0-8.3           g/dL  Albumin-Blood                            3.7               3.5-5.2          g/dL   Alcohol                                  <5                0-10             mg/dL   Creatine Kinase, Total                   206               7-232            U/L  CK, MB                                   3.5               0.3-4.0          ng/mL  Relative Index                           1.7               0.0-2.5  Troponin I                               0.01              0.00-0.06        ng/mL  Protime ( Prothrombin Time)              28.0       h      11.6-15.2        seconds  INR                                      2.64       h      0.00-1.49  Magnesium                                1.4        l      1.5-2.5          mg/dL  ASSESSMENT AND PLAN: 1) Epitaxis: Bleeding has been controlled with balloon. Hgb stable from last admission 9.4>10.5. Will place two large bore iv peripherally, monitor cbc closely. No need for transfusion or FFP at this time. 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   2) Hyponatremia: May be due to thiazide diuretic vs beer potomania. Will check urine osmalility, urine sodium, and calculate FeNa. Will start gentle hydration, stop chlorthalidone and follow up.  3)  Hypomagnesemia: Will replete and follow up.  4) Anticoagulation: Will continue coumadin. Bleeding controlled.   5) Hypertension: Soft on admission. In view of blood loss will only continue lisinopril and add other antihypertensives as tolerated.   6) CAD: In view of blood loss and possible demand ischemia will check cardiac enzymes and EKG. Patient has no complains of chest pain.   6) Alcohol abuse: Start CIWA monitor  7) Tobacco abuse: Consult Child psychotherapist for smoking cessation and start nicotine patch.     VTE PROPH: SCD's

## 2010-08-07 NOTE — Medication Information (Signed)
Summary: ccr-lr  Anticoagulant Therapy  Managed by: Vashti Hey, RN PCP: Dr.Hawkins Supervising MD: Dietrich Pates MD, Molly Maduro Indication 1: Mitral Valve Replacement (ICD-V43.3) Indication 2: Aortic Valve Replacement (ICD-V43.3) Lab Used: [facility] [place] Site: [place] INR POC 3.2  Dietary changes: no    Health status changes: no    Bleeding/hemorrhagic complications: no    Recent/future hospitalizations: no    Any changes in medication regimen? no    Recent/future dental: no  Any missed doses?: no       Is patient compliant with meds? yes       Allergies: No Known Drug Allergies  Anticoagulation Management History:      The patient is taking warfarin and comes in today for a routine follow up visit.  Negative risk factors for bleeding include an age less than 89 years old.  The bleeding index is 'low risk'.  Positive CHADS2 values include History of HTN.  Negative CHADS2 values include Age > 72 years old.  The start date was 11/02/2001.  Anticoagulation responsible provider: Dietrich Pates MD, Molly Maduro.  INR POC: 3.2.  Cuvette Lot#: [id number].  Exp: 10/11.    Anticoagulation Management Assessment/Plan:      The patient's current anticoagulation dose is Warfarin sodium 5 mg tabs: 1 tablet once daily except 1 1/2 tablets on Mondays, Wednesdays and Fridays  or as directed by anticoagulation clinic.  The target INR is 2.5 - 3.5.  The next INR is due 06/05/2010.  Anticoagulation instructions were given to patient.  Results were reviewed/authorized by Vashti Hey, RN.  He was notified by Vashti Hey RN.         Prior Anticoagulation Instructions: INR 2.2 Take coumadin 1  1/2 tablets tonight then increase dose to 1 1/2 tablets once daily except 1 tablet on Sundays, Tuesdays and Thursdays  Current Anticoagulation Instructions: INR 3.2 Continue coumadin 7.5mg  once daily except 5mg  on Sundays, Tuesdays and Thursdays

## 2010-08-07 NOTE — Medication Information (Signed)
Summary: ccr-lr  Anticoagulant Therapy  Managed by: Vashti Hey, RN Supervising MD: Daleen Squibb MD, Maisie Fus Indication 1: Mitral Valve Replacement (ICD-V43.3) Indication 2: Aortic Valve Replacement (ICD-V43.3) Lab Used: [facility] [place] Site: [place] INR POC 4.2  Dietary changes: no    Health status changes: no    Bleeding/hemorrhagic complications: no    Recent/future hospitalizations: no    Any changes in medication regimen? no    Recent/future dental: no  Any missed doses?: no       Is patient compliant with meds? yes       Anticoagulation Management History:      The patient is taking warfarin and comes in today for a routine follow up visit.  Negative risk factors for bleeding include an age less than 68 years old.  The bleeding index is 'low risk'.  Negative CHADS2 values include Age > 50 years old.  The start date was 11/02/2001.  Anticoagulation responsible provider: Daleen Squibb MD, Maisie Fus.  INR POC: 4.2.  Cuvette Lot#: [id number].  Exp: 10/11.    Anticoagulation Management Assessment/Plan:      The patient's current anticoagulation dose is Warfarin sodium 5 mg tabs: Take as directed by Coumadin Clinic.  The target INR is 2.5 - 3.5.  The next INR is due 07/31/2009.  Anticoagulation instructions were given to patient.  Results were reviewed/authorized by Vashti Hey, RN.  He was notified by Vashti Hey RN.         Prior Anticoagulation Instructions: INR 2.2 Take coumadin 1 1/2 tablet today then increase dose to 5mg  once daily except 7.5mg  on Mondays and Fridays  Current Anticoagulation Instructions: INR 4.2 Hold coumadin today then decrease dose to 5mg  once daily except 7.5mg  on Mondays

## 2010-08-07 NOTE — Medication Information (Signed)
Summary: ccr-lr  Anticoagulant Therapy  Managed by: Vashti Hey, RN PCP: Dr.Hawkins Supervising MD: Dietrich Pates MD, Molly Maduro Indication 1: Mitral Valve Replacement (ICD-V43.3) Indication 2: Aortic Valve Replacement (ICD-V43.3) Lab Used: [facility] [place] Site: [place] INR POC 3.0  Dietary changes: no    Health status changes: no    Bleeding/hemorrhagic complications: no    Recent/future hospitalizations: no    Any changes in medication regimen? no    Recent/future dental: no  Any missed doses?: no       Is patient compliant with meds? yes       Allergies: No Known Drug Allergies  Anticoagulation Management History:      The patient is taking warfarin and comes in today for a routine follow up visit.  Positive risk factors for bleeding include an age of 66 years or older.  The bleeding index is 'intermediate risk'.  Positive CHADS2 values include History of HTN.  Negative CHADS2 values include Age > 66 years old.  The start date was 11/02/2001.  Anticoagulation responsible provider: Dietrich Pates MD, Molly Maduro.  INR POC: 3.0.  Cuvette Lot#: [id number].  Exp: 10/11.    Anticoagulation Management Assessment/Plan:      The patient's current anticoagulation dose is Warfarin sodium 5 mg tabs: 1 tablet once daily except 1 1/2 tablets on Mondays, Wednesdays and Fridays  or as directed by anticoagulation clinic.  The target INR is 2.5 - 3.5.  The next INR is due 07/31/2010.  Anticoagulation instructions were given to patient.  Results were reviewed/authorized by Vashti Hey, RN.  He was notified by Vashti Hey RN.         Prior Anticoagulation Instructions: INR 2.6 Continue coumadin 7.5mg  once daily except 5mg  on Sundays, Tuesdays and Thursdays  Current Anticoagulation Instructions: INR 3.0 Continue coumadin 7.5mg  once daily except 5mg  on Sundays, Tuesdays and Thursdays

## 2010-08-07 NOTE — Assessment & Plan Note (Signed)
Summary: post hosp [facility]/tg  Medications Added OMEPRAZOLE 20 MG CPDR (OMEPRAZOLE) take 1 tab two times a day AMLODIPINE BESYLATE 10 MG TABS (AMLODIPINE BESYLATE) take 1 tab daily ASPIR-LOW 81 MG TBEC (ASPIRIN) take 1 tab daily SIMVASTATIN 80 MG TABS (SIMVASTATIN) take 1/2 tab daily      Allergies Added: NKDA  Visit Type:  Follow-up Primary Provider:  Dr.Hawkins  CC:  no complaints.  History of Present Illness: [name] is a pleasant 63 CM we are seeing on follow up after being admitted to [facility] for epitaxis int the setting of coumadin tx with known history of mechanical mitral and aortic valve replacement.  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  During hospitalization he was found to be anemic and blood transfusions were given. He was seen by ENT and required surgical intervention with cautery of multiple bleeding sites, a right edoscopic sphenopalatine artery ligation and cautry by Dr. Osborn Coho.  He was sent on [facility] bridge to coumadin on 08/01/2009.  He is now on coumadin only. He has no complaint of recurrent bleeding, dizziness. chest pain or SOB.  He remains active. We follow his coumadin dosing.  In the office today INR 1.8.  Coumadin is adjusted per Vashti Hey RN. See separate note for details.  Current Medications (verified): 1)  Chlorthalidone 25 Mg Tabs (Chlorthalidone) .... Take 1/2 Tablet Once Daily 2)  Clonidine Hcl 0.1 Mg Tabs (Clonidine Hcl) .Marland Kitchen.. 1 Tab By Mouth Two Times A Day 3)  Warfarin Sodium 5 Mg Tabs (Warfarin Sodium) .... Take As Directed By Coumadin Clinic 4)  Lisinopril 20 Mg Tabs (Lisinopril) .... Take 1 Tablet By Mouth Two Times A Day 5)  Metoprolol Tartrate 50 Mg Tabs (Metoprolol Tartrate) .Marland Kitchen.. 1 Tablet By Mouth Two Times A Day 6)  Omeprazole 20 Mg Cpdr (Omeprazole) .... Take 1 Tab Two Times A Day 7)  Amlodipine Besylate 10 Mg Tabs (Amlodipine Besylate) .... Take 1 Tab Daily 8)  Aspir-Low 81 Mg Tbec (Aspirin) .... Take 1 Tab Daily 9)  Simvastatin 80 Mg  Tabs (Simvastatin) .... Take 1/2 Tab Daily  Allergies (verified): No Known Drug Allergies  Past History:  Past Surgical History: CABG Endocopic sphenopalatine artery ligation and cautry  Review of Systems       All other systems have been reviewed and are negative unless stated above.   Vital Signs:  Patient profile:   65 year old male Height:      66 inches Weight:      168 pounds BMI:     27.21 Pulse rate:   67 / minute BP sitting:   109 / 52  (right arm)  Vitals Entered By: Dreama Saa, CNA (August 19, 2009 11:12 AM)  Physical Exam  General:  Well developed, well nourished, in no acute distress. Lungs:  Clear bilaterally to auscultation and percussion. Heart:  mid systolic click.  RRR.  Abdomen:  Bowel sounds positive; abdomen soft and non-tender without masses, organomegaly, or hernias noted. No hepatosplenomegaly. Msk:  Back normal, normal gait. Muscle strength and tone normal. Extremities:  No clubbing or cyanosis. Neurologic:  Alert and oriented x 3. Psych:  Normal affect.   EKG  Procedure date:  08/19/2009  Findings:      First degree AV-Block noted.  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 Take as directed by coumadin clinic    Lisinopril 20 Mg Tabs (Lisinopril) .Marland Kitchen... Take 1 tablet by mouth two times a day    Metoprolol Tartrate 50 Mg Tabs (Metoprolol tartrate) .Marland Kitchen... 1 tablet by mouth two times a day    Amlodipine Besylate 10 Mg Tabs (Amlodipine besylate) .Marland Kitchen... Take 1 tab daily    Aspir-low 81 Mg Tbec (Aspirin) .Marland Kitchen... Take 1 tab daily  Problem # 4:  ESSENTIAL HYPERTENSION  (ICD-401.9) Assessment: Improved  His updated medication list for this problem includes:    Chlorthalidone 25 Mg Tabs (Chlorthalidone) .Marland Kitchen... Take 1/2 tablet once daily    Clonidine Hcl 0.1 Mg Tabs (Clonidine hcl) .Marland Kitchen... 1 tab by mouth two times a day    Lisinopril 20 Mg Tabs (Lisinopril) .Marland Kitchen... Take 1 tablet by mouth two times a day    Metoprolol Tartrate 50 Mg Tabs (Metoprolol tartrate) .Marland Kitchen... 1 tablet by mouth two times a day    Amlodipine Besylate 10 Mg Tabs (Amlodipine besylate) .Marland Kitchen... Take 1 tab daily    Aspir-low 81 Mg Tbec (Aspirin) .Marland Kitchen... Take 1 tab daily  Problem # 5:  EPISTAXIS (ICD-784.7) Assessment: Improved  Patient Instructions: 1)  Your physician recommends that you schedule a follow-up appointment in: 6 months 2)  Your physician recommends that you continue on your current medications as directed. Please refer to the Current Medication list given to you today.

## 2010-08-07 NOTE — Consult Note (Signed)
Summary: [facility]   [facility]   Imported By: Roderic Ovens 08/07/2009 11:01:15  _____________________________________________________________________  External Attachment:    Type:   Image     Comment:   External Document

## 2010-08-07 NOTE — Medication Information (Signed)
Summary: ccr-lr  has appt with Dr Dietrich Pates  Anticoagulant Therapy  Managed by: Vashti Hey, RN Supervising MD: Dietrich Pates MD, Molly Maduro Indication 1: Mitral Valve Replacement (ICD-V43.3) Indication 2: Aortic Valve Replacement (ICD-V43.3) Lab Used: [facility] [place] Site: [place] INR POC 1.8  Dietary changes: no    Health status changes: no    Bleeding/hemorrhagic complications: no    Recent/future hospitalizations: no    Any changes in medication regimen? no    Recent/future dental: no  Any missed doses?: no       Is patient compliant with meds? yes       Allergies: No Known Drug Allergies  Anticoagulation Management History:      The patient is taking warfarin and comes in today for a routine follow up visit.  Negative risk factors for bleeding include an age less than 19 years old.  The bleeding index is 'low risk'.  Negative CHADS2 values include Age > 27 years old.  The start date was 11/02/2001.  Anticoagulation responsible provider: Dietrich Pates MD, Molly Maduro.  INR POC: 1.8.  Cuvette Lot#: [id number].  Exp: 10/11.    Anticoagulation Management Assessment/Plan:      The patient's current anticoagulation dose is Warfarin sodium 5 mg tabs: Take as directed by Coumadin Clinic.  The target INR is 2.5 - 3.5.  The next INR is due 08/22/2009.  Anticoagulation instructions were given to patient.  Results were reviewed/authorized by Vashti Hey, RN.  He was notified by Vashti Hey RN.         Prior Anticoagulation Instructions: INR 2.9 Resume regular dose of coumadin: 5mg  once daily except 7.5mg  on Mondays  Current Anticoagulation Instructions: INR 1.8 Take coumadin 2 1/2 tablets tonight, 2 tablets tomorrow night then resume 1 tablet once daily excpet 1 1/2 tablets on Mondays

## 2010-08-07 NOTE — Medication Information (Signed)
Summary: ccr-lr  Anticoagulant Therapy  Managed by: Vashti Hey, RN PCP: Dr.Hawkins Supervising MD: Daleen Squibb MD, Maisie Fus Indication 1: Mitral Valve Replacement (ICD-V43.3) Indication 2: Aortic Valve Replacement (ICD-V43.3) Lab Used: [facility]  Site: [place] INR POC 2.8  Dietary changes: no    Health status changes: no    Bleeding/hemorrhagic complications: no    Recent/future hospitalizations: no    Any changes in medication regimen? no    Recent/future dental: no  Any missed doses?: no       Is patient compliant with meds? yes       Allergies: No Known Drug Allergies  Anticoagulation Management History:      The patient is taking warfarin and comes in today for a routine follow up visit.  Negative risk factors for bleeding include an age less than 69 years old.  The bleeding index is 'low risk'.  Positive CHADS2 values include History of HTN.  Negative CHADS2 values include Age > 28 years old.  The start date was 11/02/2001.  Anticoagulation responsible provider: Daleen Squibb MD, Maisie Fus.  INR POC: 2.8.  Cuvette Lot#: [id number].  Exp: 10/11.    Anticoagulation Management Assessment/Plan:      The patient's current anticoagulation dose is Warfarin sodium 5 mg tabs: Take as directed by Coumadin Clinic.  The target INR is 2.5 - 3.5.  The next INR is due 09/04/2009.  Anticoagulation instructions were given to patient.  Results were reviewed/authorized by Vashti Hey, RN.  He was notified by Vashti Hey RN.         Prior Anticoagulation Instructions: INR 1.8 Take coumadin 2 1/2 tablets tonight, 2 tablets tomorrow night then resume 1 tablet once daily excpet 1 1/2 tablets on Mondays  Current Anticoagulation Instructions: INR 2.8 Continue coumadin 5mg  once daily except 7.5mg  on Mondays

## 2010-08-07 NOTE — Letter (Signed)
Summary: [place] [facility], [facility] at [employer]  [address], [place] [address]   Phone: [phone]  Fax: [phone]     February 26, 2010 MRN: [id number]   [name] [address] [place], [place]  [address]      YOUR LAB WORK IS DUE   March 31, 2010  Please go to [facility], located across the street from [facility] on the second floor.  Hours are Monday - Friday 7am until 7:30pm         Saturday 8am until 12noon    __  DO NOT EAT OR DRINK AFTER MIDNIGHT EVENING PRIOR TO LABWORK  _X_ YOUR LABWORK IS NOT FASTING --YOU MAY EAT PRIOR TO LABWORK

## 2010-08-07 NOTE — Miscellaneous (Signed)
Summary: labs cbcd,cmp,lipids,02/25/2010  Clinical Lists Changes  Observations: Added new observation of CALCIUM: 9.6 mg/dL (16/04/9603 54:09) Added new observation of ALBUMIN: 4.7 g/dL (81/19/1478 29:56) Added new observation of PROTEIN, TOT: 7.6 g/dL (21/30/8657 84:69) Added new observation of SGPT (ALT): 14 units/L (02/24/2010 11:55) Added new observation of SGOT (AST): 26 units/L (02/24/2010 11:55) Added new observation of ALK PHOS: 112 units/L (02/24/2010 11:55) Added new observation of CREATININE: 1.00 mg/dL (62/95/2841 32:44) Added new observation of BUN: 17 mg/dL (07/08/7251 66:44) Added new observation of BG RANDOM: 104 mg/dL (03/47/4259 56:38) Added new observation of CO2 PLSM/SER: 24 meq/L (02/24/2010 11:55) Added new observation of CL SERUM: 96 meq/L (02/24/2010 11:55) Added new observation of K SERUM: 5.2 meq/L (02/24/2010 11:55) Added new observation of NA: 130 meq/L (02/24/2010 11:55) Added new observation of LDL: 14 mg/dL (75/64/3329 51:88) Added new observation of HDL: 71 mg/dL (41/66/0630 16:01) Added new observation of TRIGLYC TOT: 72 mg/dL (09/32/3557 32:20) Added new observation of CHOLESTEROL: 191 mg/dL (25/42/7062 37:62) Added new observation of ABSOLUTE BAS: 0.0 K/uL (02/24/2010 11:55) Added new observation of BASOPHIL %: 1 % (02/24/2010 11:55) Added new observation of EOS ABSLT: 0.6 K/uL (02/24/2010 11:55) Added new observation of % EOS AUTO: 8 % (02/24/2010 11:55) Added new observation of ABSOLUTE MON: 0.8 K/uL (02/24/2010 11:55) Added new observation of MONOCYTE %: 11 % (02/24/2010 11:55) Added new observation of ABS LYMPHOCY: 0.7 K/uL (02/24/2010 11:55) Added new observation of LYMPHS %: 10 % (02/24/2010 11:55) Added new observation of PLATELETK/UL: 324 K/uL (02/24/2010 11:55) Added new observation of RDW: 14.0 % (02/24/2010 11:55) Added new observation of MCHC RBC: 34.0 g/dL (83/15/1761 60:73) Added new observation of MCV: 93.1 fL (02/24/2010 11:55) Added  new observation of HCT: 35.3 % (02/24/2010 11:55) Added new observation of HGB: 12.0 g/dL (71/12/2692 85:46) Added new observation of RBC M/UL: 3.79 M/uL (02/24/2010 11:55) Added new observation of WBC COUNT: 7.1 10*3/microliter (02/24/2010 11:55)

## 2010-08-07 NOTE — Miscellaneous (Signed)
Summary: labs cbcd,bmp,03/28/2010  Clinical Lists Changes  Observations: Added new observation of CALCIUM: 10.1 mg/dL (45/40/9811 9:14) Added new observation of CREATININE: 0.87 mg/dL (78/29/5621 3:08) Added new observation of BUN: 13 mg/dL (65/78/4696 2:95) Added new observation of BG RANDOM: 109 mg/dL (28/41/3244 0:10) Added new observation of CO2 PLSM/SER: 28 meq/L (03/28/2010 8:08) Added new observation of CL SERUM: 92 meq/L (03/28/2010 8:08) Added new observation of K SERUM: 5.5 meq/L (03/28/2010 8:08) Added new observation of NA: 130 meq/L (03/28/2010 8:08) Added new observation of ABSOLUTE BAS: 0.1 K/uL (03/28/2010 8:08) Added new observation of BASOPHIL %: 1 % (03/28/2010 8:08) Added new observation of EOS ABSLT: 0.3 K/uL (03/28/2010 8:08) Added new observation of % EOS AUTO: 4 % (03/28/2010 8:08) Added new observation of ABSOLUTE MON: 0.7 K/uL (03/28/2010 8:08) Added new observation of MONOCYTE %: 12 % (03/28/2010 8:08) Added new observation of ABS LYMPHOCY: 0.7 K/uL (03/28/2010 8:08) Added new observation of LYMPHS %: 11 % (03/28/2010 8:08) Added new observation of PLATELETK/UL: 338 K/uL (03/28/2010 8:08) Added new observation of RDW: 13.6 % (03/28/2010 8:08) Added new observation of MCHC RBC: 35.2 g/dL (27/25/3664 4:03) Added new observation of MCV: 92.7 fL (03/28/2010 8:08) Added new observation of HCT: 35.8 % (03/28/2010 8:08) Added new observation of HGB: 12.6 g/dL (47/42/5956 3:87) Added new observation of RBC M/UL: 3.86 M/uL (03/28/2010 8:08) Added new observation of WBC COUNT: 6.3 10*3/microliter (03/28/2010 8:08)

## 2010-08-07 NOTE — Miscellaneous (Signed)
Summary: hospital discharge  Hospital Discharge  Date of admission: 09/04/2009  Date of discharge: 09/05/2009   Brief reason for admission/active problems: Epsitaxis  Followup needed:  - Outpatient follow up with Dr. Molli Barrows for epistaxis - Continue with keflex for 5 days untill the balloon is there in the nostril - Coumadin check as per his schedule - BP: holding chlorthalidone due to hyponatremia  The medication and problem lists have been updated.  Please see the dictated discharge summary for details.  Medications: Removed medication of CHLORTHALIDONE 25 MG TABS (CHLORTHALIDONE) take 1/2 tablet once daily - Signed Added new medication of CEPHALEXIN 500 MG CAPS (CEPHALEXIN) Take 1 tablet by mouth two times a day for 5 days - Signed Rx of CEPHALEXIN 500 MG CAPS (CEPHALEXIN) Take 1 tablet by mouth two times a day for 5 days;  #10 x 0;  Signed;  Entered by: Bethel Born MD;  Authorized by: Bethel Born MD;  Method used: Electronically to [facility] [address], [place], [place], [place]  [address], Ph: [phone], Fax: [phone] Observations: Added new observation of INSTRUCTIONS: Your next appointment with Dr. Molli Barrows is on March 7th, 2011 at  1:50pm Stop taking chlorthalidone for now.  It is not healthy  for men to drink alcohol especially with heart problems. (09/05/2009 11:02)    Prescriptions: CEPHALEXIN 500 MG CAPS (CEPHALEXIN) Take 1 tablet by mouth two times a day for 5 days  #10 x 0   Entered and Authorized by:   Bethel Born MD   Signed by:   Bethel Born MD on 09/05/2009   Method used:   Electronically to        [facility]  [address] (retail)       [address]       [place], [place]  [address]       Ph: [phone]       Fax: [phone]   RxID:   [id number]    Patient Instructions: 1)  Your next appointment with Dr. Molli Barrows is on March 7th, 2011 at  1:50pm 2)  Stop taking chlorthalidone for now. 3)   It is not  healthy  for men to drink alcohol especially with heart problems.

## 2010-08-07 NOTE — Letter (Signed)
Summary: Appointment - Missed   [facility]  [address] [place] [address]   Phone: [phone]  Fax: [phone]     September 04, 2009 MRN: [id number]   [name] [address] [place], [place]  [address]   Dear [name],  Our records indicate you missed your appointment on    09/04/09   with COUMADIN                                   It is very important that we reach you to reschedule this appointment. We look forward to participating in your health care needs. Please contact us at the number listed above at your earliest convenience to reschedule this appointment.     Sincerely,    Glass blower/designer

## 2010-08-07 NOTE — Medication Information (Signed)
Summary: protime per checkout on 2/2/tg  Medications Added LISINOPRIL 20 MG TABS (LISINOPRIL) Take 1 tablet by mouth two times a day METOPROLOL TARTRATE 50 MG TABS (METOPROLOL TARTRATE) 1 tablet by mouth two times a day      Allergies Added: NKDA Anticoagulant Therapy  Managed by: Teressa Lower, RN Supervising MD: Diona Browner MD, Remi Deter Indication 1: Mitral Valve Replacement (ICD-V43.3) Indication 2: Aortic Valve Replacement (ICD-V43.3) Lab Used: [facility] [place] Site: [place] INR POC 2.2  Dietary changes: no    Health status changes: no    Bleeding/hemorrhagic complications: no       Details:    Recent/future hospitalizations: no    Any changes in medication regimen? no    Recent/future dental: no  Any missed doses?: no       Is patient compliant with meds? yes       Current Medications (verified): 1)  Chlorthalidone 25 Mg Tabs (Chlorthalidone) .... Take 1/2 Tablet Once Daily 2)  Clonidine Hcl 0.1 Mg Tabs (Clonidine Hcl) .Marland Kitchen.. 1 Tab By Mouth Two Times A Day 3)  Warfarin Sodium 5 Mg Tabs (Warfarin Sodium) .... Take As Directed By Coumadin Clinic 4)  Lisinopril 20 Mg Tabs (Lisinopril) .... Take 1 Tablet By Mouth Two Times A Day 5)  Metoprolol Tartrate 50 Mg Tabs (Metoprolol Tartrate) .Marland Kitchen.. 1 Tablet By Mouth Two Times A Day  Allergies (verified): No Known Drug Allergies  Anticoagulation Management History:      The patient is taking warfarin and comes in today for a routine follow up visit.  Negative risk factors for bleeding include an age less than 67 years old.  The bleeding index is 'low risk'.  Negative CHADS2 values include Age > 98 years old.  The start date was 11/02/2001.  Anticoagulation responsible provider: Diona Browner MD, Remi Deter.  INR POC: 2.2.  Exp: 10/11.    Anticoagulation Management Assessment/Plan:      The patient's current anticoagulation dose is Warfarin sodium 5 mg tabs: Take as directed by Coumadin Clinic.  The target INR  is 2.5 - 3.5.  The next INR is due 08/12/2009.  Anticoagulation instructions were given to patient.  Results were reviewed/authorized by Teressa Lower, RN.  He was notified by Teressa Lower RN.         Prior Anticoagulation Instructions: INR 1.8 Pt to continue Lovenox 80mg  subcutaneously two times a day  Pt to take couamdin 10mg  tonight and tomorrow night and come for repeat INR on 08/09/09  Current Anticoagulation Instructions: INR 2.2 TODAY TAKE TWO TABLETS FRIDAY AND SATURDAY, THEN TAKE ONE TABLET ON SUNDAY AND RETURN ON MONDAY FOR RECHECK OF INR

## 2010-08-28 ENCOUNTER — Encounter (INDEPENDENT_AMBULATORY_CARE_PROVIDER_SITE_OTHER): Payer: Medicare Other

## 2010-08-28 ENCOUNTER — Encounter: Payer: Self-pay | Admitting: Cardiology

## 2010-08-28 DIAGNOSIS — I059 Rheumatic mitral valve disease, unspecified: Secondary | ICD-10-CM

## 2010-08-28 DIAGNOSIS — Z7901 Long term (current) use of anticoagulants: Secondary | ICD-10-CM

## 2010-09-02 NOTE — Medication Information (Signed)
Summary: [id number]  Anticoagulant Therapy  Managed by: Vashti Hey, RN PCP: Dr.Hawkins Supervising MD: Dietrich Pates MD, Molly Maduro Indication 1: Mitral Valve Replacement (ICD-V43.3) Indication 2: Aortic Valve Replacement (ICD-V43.3) Lab Used: [facility] [place] Site: [place]  Dietary changes: no    Health status changes: no    Bleeding/hemorrhagic complications: no    Recent/future hospitalizations: no    Any changes in medication regimen? no    Recent/future dental: no  Any missed doses?: yes     Details: missed 1 dose 2 -3 weeks ago  Is patient compliant with meds? yes       Allergies: No Known Drug Allergies  Anticoagulation Management History:      The patient is taking warfarin and comes in today for a routine follow up visit.  Positive risk factors for bleeding include an age of 66 years or older.  The bleeding index is 'intermediate risk'.  Positive CHADS2 values include History of HTN.  Negative CHADS2 values include Age > 63 years old.  The start date was 11/02/2001.  Anticoagulation responsible provider: Dietrich Pates MD, Molly Maduro.  Cuvette Lot#: [id number].  Exp: 10/11.    Anticoagulation Management Assessment/Plan:      The patient's current anticoagulation dose is Warfarin sodium 5 mg tabs: 1 tablet once daily except 1 1/2 tablets on Mondays, Wednesdays and Fridays  or as directed by anticoagulation clinic.  The target INR is 2.5 - 3.5.  The next INR is due 09/25/2010.  Anticoagulation instructions were given to patient.  Results were reviewed/authorized by Vashti Hey, RN.  He was notified by Vashti Hey RN.         Prior Anticoagulation Instructions: INR 3.3 Continue coumadin 7.5mg  once daily except 5mg  on Sundays, Tuesdays and Thursdays  Current Anticoagulation Instructions: INR 2.4 Take coumadin 1 1/2 tablets today then resume 1 1/2 tablets once daily except 1 tablet on Sundays, Tuesdays and Thursdays

## 2010-09-20 ENCOUNTER — Encounter: Payer: Self-pay | Admitting: Cardiology

## 2010-09-20 DIAGNOSIS — Z954 Presence of other heart-valve replacement: Secondary | ICD-10-CM

## 2010-09-20 DIAGNOSIS — Z9889 Other specified postprocedural states: Secondary | ICD-10-CM

## 2010-09-20 DIAGNOSIS — I359 Nonrheumatic aortic valve disorder, unspecified: Secondary | ICD-10-CM

## 2010-09-20 DIAGNOSIS — I059 Rheumatic mitral valve disease, unspecified: Secondary | ICD-10-CM

## 2010-09-21 LAB — CROSSMATCH
ABO/RH(D): O POS
Antibody Screen: NEGATIVE

## 2010-09-21 LAB — DIFFERENTIAL
Basophils Absolute: 0 10*3/uL (ref 0.0–0.1)
Basophils Absolute: 0 10*3/uL (ref 0.0–0.1)
Basophils Relative: 0 % (ref 0–1)
Basophils Relative: 1 % (ref 0–1)
Eosinophils Absolute: 0.4 10*3/uL (ref 0.0–0.7)
Eosinophils Absolute: 1 10*3/uL — ABNORMAL HIGH (ref 0.0–0.7)
Eosinophils Relative: 14 % — ABNORMAL HIGH (ref 0–5)
Eosinophils Relative: 4 % (ref 0–5)
Lymphs Abs: 1 10*3/uL (ref 0.7–4.0)
Monocytes Absolute: 0.6 10*3/uL (ref 0.1–1.0)
Monocytes Absolute: 0.7 10*3/uL (ref 0.1–1.0)
Monocytes Absolute: 1 10*3/uL (ref 0.1–1.0)
Monocytes Relative: 10 % (ref 3–12)
Neutro Abs: 4 10*3/uL (ref 1.7–7.7)
Neutrophils Relative %: 62 % (ref 43–77)
Neutrophils Relative %: 73 % (ref 43–77)

## 2010-09-21 LAB — CBC
HCT: 31.6 % — ABNORMAL LOW (ref 39.0–52.0)
Hemoglobin: 10.8 g/dL — ABNORMAL LOW (ref 13.0–17.0)
Hemoglobin: 9.7 g/dL — ABNORMAL LOW (ref 13.0–17.0)
MCHC: 34 g/dL (ref 30.0–36.0)
MCHC: 34.2 g/dL (ref 30.0–36.0)
MCHC: 34.5 g/dL (ref 30.0–36.0)
MCV: 94.2 fL (ref 78.0–100.0)
Platelets: 344 10*3/uL (ref 150–400)
RDW: 13.8 % (ref 11.5–15.5)
RDW: 14 % (ref 11.5–15.5)
WBC: 7 10*3/uL (ref 4.0–10.5)

## 2010-09-21 LAB — BASIC METABOLIC PANEL
BUN: 15 mg/dL (ref 6–23)
CO2: 23 mEq/L (ref 19–32)
Calcium: 8.9 mg/dL (ref 8.4–10.5)
Creatinine, Ser: 0.98 mg/dL (ref 0.4–1.5)
Glucose, Bld: 110 mg/dL — ABNORMAL HIGH (ref 70–99)

## 2010-09-21 LAB — POCT I-STAT, CHEM 8
Calcium, Ion: 1.06 mmol/L — ABNORMAL LOW (ref 1.12–1.32)
Glucose, Bld: 112 mg/dL — ABNORMAL HIGH (ref 70–99)
HCT: 31 % — ABNORMAL LOW (ref 39.0–52.0)
Hemoglobin: 10.5 g/dL — ABNORMAL LOW (ref 13.0–17.0)
Potassium: 4.4 mEq/L (ref 3.5–5.1)

## 2010-09-21 LAB — PROTIME-INR: INR: 3.25 — ABNORMAL HIGH (ref 0.00–1.49)

## 2010-09-21 LAB — ABO/RH: ABO/RH(D): O POS

## 2010-09-21 LAB — PREPARE FRESH FROZEN PLASMA

## 2010-09-21 LAB — APTT: aPTT: 46 seconds — ABNORMAL HIGH (ref 24–37)

## 2010-09-22 LAB — BASIC METABOLIC PANEL
BUN: 10 mg/dL (ref 6–23)
CO2: 25 mEq/L (ref 19–32)
CO2: 25 mEq/L (ref 19–32)
Calcium: 8.5 mg/dL (ref 8.4–10.5)
Calcium: 8.8 mg/dL (ref 8.4–10.5)
Chloride: 97 mEq/L (ref 96–112)
Creatinine, Ser: 0.84 mg/dL (ref 0.4–1.5)
GFR calc Af Amer: >60 mL/min (ref >60)
GFR calc Af Amer: >60 mL/min (ref >60)
GFR calc Af Amer: >60 mL/min (ref >60)
GFR calc non Af Amer: >60 mL/min (ref >60)
Potassium: 4.9 mEq/L (ref 3.5–5.1)
Sodium: 132 mEq/L — ABNORMAL LOW (ref 135–145)
Sodium: 132 mEq/L — ABNORMAL LOW (ref 135–145)

## 2010-09-22 LAB — POCT I-STAT, CHEM 8
Calcium, Ion: 1.13 mmol/L (ref 1.12–1.32)
Chloride: 100 mEq/L (ref 96–112)
HCT: 25 % — ABNORMAL LOW (ref 39.0–52.0)
Hemoglobin: 8.5 g/dL — ABNORMAL LOW (ref 13.0–17.0)
Potassium: 3.7 mEq/L (ref 3.5–5.1)

## 2010-09-22 LAB — CBC
HCT: 21.9 % — ABNORMAL LOW (ref 39.0–52.0)
HCT: 36.3 % — ABNORMAL LOW (ref 39.0–52.0)
Hemoglobin: 12.5 g/dL — ABNORMAL LOW (ref 13.0–17.0)
MCHC: 33.8 g/dL (ref 30.0–36.0)
MCHC: 34.4 g/dL (ref 30.0–36.0)
MCHC: 34.7 g/dL (ref 30.0–36.0)
MCHC: 35.1 g/dL (ref 30.0–36.0)
MCHC: 35.4 g/dL (ref 30.0–36.0)
MCV: 91.9 fL (ref 78.0–100.0)
MCV: 94.5 fL (ref 78.0–100.0)
MCV: 95.1 fL (ref 78.0–100.0)
MCV: 95.3 fL (ref 78.0–100.0)
MCV: 96.1 fL (ref 78.0–100.0)
Platelets: 287 10*3/uL (ref 150–400)
Platelets: 298 10*3/uL (ref 150–400)
Platelets: 307 10*3/uL (ref 150–400)
Platelets: 331 10*3/uL (ref 150–400)
Platelets: 343 10*3/uL (ref 150–400)
RBC: 2.91 MIL/uL — ABNORMAL LOW (ref 4.22–5.81)
RBC: 3.08 MIL/uL — ABNORMAL LOW (ref 4.22–5.81)
RBC: 3.78 MIL/uL — ABNORMAL LOW (ref 4.22–5.81)
RDW: 15.4 % (ref 11.5–15.5)
RDW: 15.4 % (ref 11.5–15.5)
WBC: 5.5 10*3/uL (ref 4.0–10.5)
WBC: 6.4 10*3/uL (ref 4.0–10.5)
WBC: 7.9 10*3/uL (ref 4.0–10.5)

## 2010-09-22 LAB — APTT: aPTT: 41 seconds — ABNORMAL HIGH (ref 24–37)

## 2010-09-22 LAB — CARDIAC PANEL(CRET KIN+CKTOT+MB+TROPI)
CK, MB: 3.1 ng/mL (ref 0.3–4.0)
Total CK: 151 U/L (ref 7–232)
Troponin I: 0.05 ng/mL (ref 0.00–0.06)

## 2010-09-22 LAB — DIFFERENTIAL
Basophils Relative: 0 % (ref 0–1)
Basophils Relative: 0 % (ref 0–1)
Eosinophils Absolute: 0.2 10*3/uL (ref 0.0–0.7)
Eosinophils Absolute: 0.3 10*3/uL (ref 0.0–0.7)
Lymphs Abs: 0.4 10*3/uL — ABNORMAL LOW (ref 0.7–4.0)
Monocytes Absolute: 0.7 10*3/uL (ref 0.1–1.0)
Monocytes Relative: 8 % (ref 3–12)
Neutrophils Relative %: 80 % — ABNORMAL HIGH (ref 43–77)

## 2010-09-22 LAB — COMPREHENSIVE METABOLIC PANEL
ALT: 27 U/L (ref 0–53)
CO2: 24 mEq/L (ref 19–32)
Calcium: 8.5 mg/dL (ref 8.4–10.5)
Creatinine, Ser: 0.96 mg/dL (ref 0.4–1.5)
GFR calc non Af Amer: >60 mL/min (ref >60)
Glucose, Bld: 110 mg/dL — ABNORMAL HIGH (ref 70–99)

## 2010-09-22 LAB — MAGNESIUM
Magnesium: 1.1 mg/dL — ABNORMAL LOW (ref 1.5–2.5)
Magnesium: 1.3 mg/dL — ABNORMAL LOW (ref 1.5–2.5)

## 2010-09-22 LAB — HEPARIN LEVEL (UNFRACTIONATED)
Heparin Unfractionated: 0.29 IU/mL — ABNORMAL LOW (ref 0.30–0.70)
Heparin Unfractionated: 0.54 IU/mL (ref 0.30–0.70)

## 2010-09-22 LAB — PROTIME-INR
INR: 1.18 (ref 0.00–1.49)
INR: 1.31 (ref 0.00–1.49)
INR: 2.15 — ABNORMAL HIGH (ref 0.00–1.49)
INR: 2.43 — ABNORMAL HIGH (ref 0.00–1.49)
Prothrombin Time: 14.9 seconds (ref 11.6–15.2)
Prothrombin Time: 16.2 seconds — ABNORMAL HIGH (ref 11.6–15.2)
Prothrombin Time: 26.2 seconds — ABNORMAL HIGH (ref 11.6–15.2)

## 2010-09-22 LAB — TYPE AND SCREEN

## 2010-09-22 LAB — RETICULOCYTES
RBC.: 3.94 MIL/uL — ABNORMAL LOW (ref 4.22–5.81)
Retic Ct Pct: 2.1 % (ref 0.4–3.1)

## 2010-09-22 LAB — FOLATE: Folate: 18.2 ng/mL

## 2010-09-22 LAB — FERRITIN: Ferritin: 224 ng/mL (ref 22–322)

## 2010-09-22 LAB — PREPARE RBC (CROSSMATCH)

## 2010-09-22 LAB — ABO/RH: ABO/RH(D): O POS

## 2010-09-25 ENCOUNTER — Ambulatory Visit (INDEPENDENT_AMBULATORY_CARE_PROVIDER_SITE_OTHER): Payer: Medicare Other | Admitting: *Deleted

## 2010-09-25 DIAGNOSIS — I359 Nonrheumatic aortic valve disorder, unspecified: Secondary | ICD-10-CM

## 2010-09-25 DIAGNOSIS — Z9889 Other specified postprocedural states: Secondary | ICD-10-CM

## 2010-09-25 DIAGNOSIS — Z954 Presence of other heart-valve replacement: Secondary | ICD-10-CM

## 2010-09-25 DIAGNOSIS — I059 Rheumatic mitral valve disease, unspecified: Secondary | ICD-10-CM

## 2010-09-28 LAB — DIFFERENTIAL
Basophils Absolute: 0 10*3/uL (ref 0.0–0.1)
Basophils Relative: 0 % (ref 0–1)
Monocytes Relative: 5 % (ref 3–12)
Neutro Abs: 8.1 10*3/uL — ABNORMAL HIGH (ref 1.7–7.7)
Neutrophils Relative %: 87 % — ABNORMAL HIGH (ref 43–77)

## 2010-09-28 LAB — URINALYSIS, ROUTINE W REFLEX MICROSCOPIC
Bilirubin Urine: NEGATIVE
Nitrite: NEGATIVE
Protein, ur: NEGATIVE mg/dL
Urobilinogen, UA: 0.2 mg/dL (ref 0.0–1.0)

## 2010-09-28 LAB — PROTIME-INR: INR: 2.64 — ABNORMAL HIGH (ref 0.00–1.49)

## 2010-09-28 LAB — CBC
MCHC: 35.3 g/dL (ref 30.0–36.0)
RBC: 3.15 MIL/uL — ABNORMAL LOW (ref 4.22–5.81)

## 2010-09-28 LAB — BASIC METABOLIC PANEL
CO2: 22 mEq/L (ref 19–32)
Calcium: 9 mg/dL (ref 8.4–10.5)
Creatinine, Ser: 1.56 mg/dL — ABNORMAL HIGH (ref 0.4–1.5)
GFR calc Af Amer: 54 mL/min — ABNORMAL LOW (ref >60)
GFR calc non Af Amer: 45 mL/min — ABNORMAL LOW (ref >60)

## 2010-09-28 LAB — HEPATIC FUNCTION PANEL
ALT: 14 U/L (ref 0–53)
Alkaline Phosphatase: 98 U/L (ref 39–117)
Bilirubin, Direct: 0.1 mg/dL (ref 0.0–0.3)
Indirect Bilirubin: 0.6 mg/dL (ref 0.3–0.9)

## 2010-09-28 LAB — TYPE AND SCREEN: ABO/RH(D): O POS

## 2010-09-28 LAB — ETHANOL: Alcohol, Ethyl (B): <5 mg/dL (ref 0–10)

## 2010-09-29 LAB — PROTIME-INR: INR: 2.17 — ABNORMAL HIGH (ref 0.00–1.49)

## 2010-09-29 LAB — CROSSMATCH

## 2010-09-29 LAB — BASIC METABOLIC PANEL
Calcium: 8.9 mg/dL (ref 8.4–10.5)
GFR calc Af Amer: >60 mL/min (ref >60)
GFR calc non Af Amer: >60 mL/min (ref >60)
Glucose, Bld: 116 mg/dL — ABNORMAL HIGH (ref 70–99)
Sodium: 130 mEq/L — ABNORMAL LOW (ref 135–145)

## 2010-09-29 LAB — CBC
Hemoglobin: 9.5 g/dL — ABNORMAL LOW (ref 13.0–17.0)
RDW: 14.7 % (ref 11.5–15.5)
WBC: 6.9 10*3/uL (ref 4.0–10.5)

## 2010-09-29 LAB — MAGNESIUM
Magnesium: 1.4 mg/dL — ABNORMAL LOW (ref 1.5–2.5)
Magnesium: 1.5 mg/dL (ref 1.5–2.5)

## 2010-09-29 LAB — URINALYSIS, DIPSTICK ONLY
Nitrite: NEGATIVE
Specific Gravity, Urine: 1.013 (ref 1.005–1.030)
Urobilinogen, UA: 0.2 mg/dL (ref 0.0–1.0)
pH: 7 (ref 5.0–8.0)

## 2010-09-29 LAB — CARDIAC PANEL(CRET KIN+CKTOT+MB+TROPI)
Relative Index: 1.7 (ref 0.0–2.5)
Troponin I: 0.01 ng/mL (ref 0.00–0.06)

## 2010-10-23 ENCOUNTER — Ambulatory Visit (INDEPENDENT_AMBULATORY_CARE_PROVIDER_SITE_OTHER): Payer: Medicare Other | Admitting: *Deleted

## 2010-10-23 DIAGNOSIS — I359 Nonrheumatic aortic valve disorder, unspecified: Secondary | ICD-10-CM

## 2010-10-23 DIAGNOSIS — I059 Rheumatic mitral valve disease, unspecified: Secondary | ICD-10-CM

## 2010-10-23 DIAGNOSIS — Z954 Presence of other heart-valve replacement: Secondary | ICD-10-CM

## 2010-10-23 DIAGNOSIS — Z9889 Other specified postprocedural states: Secondary | ICD-10-CM

## 2010-10-23 LAB — POCT INR: INR: 3.8

## 2010-11-18 NOTE — Assessment & Plan Note (Signed)
[name], [name]                 CHART#:  [id number]   DATE:  01/18/2007                       DOB:  1945-08-02   CHIEF COMPLAINT:  Followup elevated alkaline phosphatase, history of  hemolytic anemia.   SUBJECTIVE:  The patient is a 65 year old male who is here for followup.  He has a history of elevated alkaline phosphatase.  He generally  consumes about 3 beers a day, some days more, some days less.  His  alkaline phosphatase has ranged in the 120s to 130s.  Most recent lab  work shows alkaline phosphatase of 124, otherwise normal LFTs.  His CBC  was normal except for a 12% eosinophilia.  He has a history of hemolytic  anemia, has been seen by [name] in the past.  He had an abdominal  ultrasound on 09/10/2005, which was normal except for simple renal  cysts.  He denies any abdominal pain.  His weight has remained stable.  He tells me that his wife passed away recently, and he is having  difficulty adjusting to this.  He denies any breakthrough heartburn of  indigestion.   He is on omeprazole 40 mg in the morning.   CURRENT MEDICATIONS:  See the list from 01/18/2007.   ALLERGIES:  No known drug allergies.   OBJECTIVE:  VITAL SIGNS:  Weight 171 and 1/2 pounds, height 56 and 1/2  inches, temp 99.5, blood pressure 150/70, pulse 72.  GENERAL:  The patient is an elderly Caucasian male in no acute distress.  He does have prominent facial flushing and ruddy complexion.  CHEST:  Heart regular rate and rhythm with a valvular click.  ABDOMEN:  Positive bowel sounds x4.  No bruits auscultated.  He does  have a tiny umbilical hernia which is easily reducible.  There is no  rebound tenderness or guarding.  No hepatosplenomegaly or mass.  EXTREMITIES:  Without clubbing or edema bilaterally.   ASSESSMENT:  1. The patient is a 65 year old Caucasian male with a history of      chronic gastroesophageal reflux disease, well controlled on PPI.  2. Hemolytic anemia.  Hemoglobin now  stable.  3. Diverticulosis.  4. Mildly elevated alkaline phosphatase which is stable, suspect due      to excessive alcohol consumption.  Ultrasound from last year did      not show any evidence of cirrhosis.   PLAN:  1. I have urged him to decrease his alcohol consumption significantly.  2. Colonoscopy in 04/2012.  3. He is going to follow up with [name] unless he has any      problems in the interim, he will      contact us.  4. Continue omeprazole 40 mg daily.       [name], N.P.  Electronically Signed     [name], M.D.  Electronically Signed    KJ/MEDQ  D:  01/20/2007  T:  01/20/2007  Job:  [id number]   cc:   [name], M.D.

## 2010-11-18 NOTE — Letter (Signed)
March 05, 2008    Edward L. Juanetta Gosling, MD  [address]  [place], [place] [address]   RE:  CHUNG, CHAGOYA  MRN:  [id number]  /  DOB:  09/08/1945   Dear Renae Fickle,   Mr. Barrell returns to the office for continued assessment and treatment  of coronary disease, previous aortic valve replacement surgery, and  multiple cardiovascular risk factors including hypertension and  hyperlipidemia.  Since his last visit, he has done fairly well.  He has  had some difficulty coordinating his medical therapy with the [employer].  He works outside around the house without  difficulty.  He denies all cardiopulmonary symptoms.  He returns stool  specimens for hemoccult testing 6 months ago, but we cannot locate the  results.   CURRENT MEDICATIONS:  1. Aspirin 81 mg daily.  2. Warfarin as directed with stable and therapeutic anticoagulation.  3. Omeprazole 20 mg b.i.d.  4. Metoprolol 50 mg b.i.d.  5. Lisinopril 40 mg daily.  6. Chlorthalidone 12.5 mg daily.  7. Simvastatin 80 mg nightly.  8. Ezetimibe 10 mg nightly.  9. Amlodipine 10 mg daily.   PHYSICAL EXAMINATION:  GENERAL:  Pleasant, proportionate gentleman in no  acute distress.  VITAL SIGNS:  The weight is 167, 11 pounds less than 6 months ago.  Blood pressure 150/80, heart rate 68 and regular, and respirations 14.  NECK:  No jugular venous distention; questionable minimal transmitted  murmur.  LUNGS:  Clear.  CARDIAC:  Prosthetic first and second heart sounds; grade 2/6 systolic  ejection murmur.  ABDOMEN:  Soft and nontender; no organomegaly.  EXTREMITIES:  No edema.   RECENT LABORATORIES:  Fairly good with a normal CBC, a normal metabolic  profile except for a serum sodium of 129, glucose of 106, and alkaline  phosphatase of 120.  Lipid profile was suboptimal with total cholesterol  of 232, HDL of 63, and LDL of 147.   IMPRESSION:  Mr. Wasco is doing well from a symptomatic standpoint.  Medical therapy is somewhat  suboptimal.  We will start clonidine 0.1 mg  b.i.d. as planned a few months ago.  Despite mild hyponatremia, we will  continue diuretics for now and reassess.  A chemistry profile in 2  months.  Stool for hemoccult testing will be recollected.  Mr. Dubie  will continue to attend Anticoagulation Clinic.  We will change the  simvastatin to Crestor or add Welchol or cholestyramine, based upon  availability in the [facility].  I will see this nice gentleman again in  6 months.  Cardiology nurses will assess his blood pressure monthly.    Sincerely,      Gerrit Friends. Dietrich Pates, MD, [facility]  Electronically Signed    RMR/MedQ  DD: 03/05/2008  DT: 03/06/2008  Job #: [id number]

## 2010-11-18 NOTE — Letter (Signed)
August 11, 2007    [name], M.D.  [address]  [place], [place] [address]   RE:  [name], [name]  MRN:  [id number]  /  DOB:  1946-01-19   Dear Ed:   [name] returns to the office for continued assessment and treatment  of coronary disease, valvular heart disease, hypertension and  dyslipidemia.  Since his last visit, he has done generally well.  He  continues to grieve the loss of his wife who died 10 months ago, but  feels that he is improving.  He has not received psychotherapy nor  required antidepressants.  He has monitored blood pressures at home with  many values elevated above the optimal range.  Some systolics are in the  180s.  Coumadin treatment has been stable and therapeutic  His anemia  has resolved.  From a physiologic standpoint, he is doing fairly well.   CURRENT MEDICATIONS:  1. Aspirin 81 mg daily.  2. Warfarin as directed with his last INR measured at 3.6.  3. Folate.  4. Omeprazole 20 mg b.i.d.  5. Iron supplement once a day.  6. Metoprolol 50 mg b.i.d.  7. Chlorthalidone 12.5 mg daily.  8. Lisinopril 20 mg daily.  9. Felodipine 5 mg daily.   CBC, chemistry profile, and lipid profile were good few months ago.   PHYSICAL EXAMINATION:  A pleasant, proportionate [name].  The weight is 178, 8 pounds more than last year.  Blood pressure 140/80,  heart rate 70 and regular, respirations 18.  Neck:  No jugular venous distention; no carotid bruits.  LUNGS:  Clear.  CARDIAC:  Prosthetic first and second heart sounds; modest systolic  ejection murmur; normal PMI.  ABDOMEN:  Soft and nontender; no organomegaly.  EXTREMITIES:  No edema.   IMPRESSION:  [name] is doing generally well.  Due to continuing  anticoagulation, we will check stool for hemoccult.  His blood pressure  control is suboptimal.  His dose of chlorthalidone, lisinopril and  felodipine will be increased.  He is to be seen at the [place] next week at  which time these higher doses of  medications can be instituted.  He will  continue to monitor blood pressure at home a return to see the  cardiology nurse in 6 weeks.  I will see [name] again in 6  months.    Sincerely,      Gerrit Friends. Dietrich Pates, MD, [facility]  Electronically Signed    RMR/MedQ  DD: 08/11/2007  DT: 08/12/2007  Job #: [id number]

## 2010-11-20 ENCOUNTER — Ambulatory Visit (INDEPENDENT_AMBULATORY_CARE_PROVIDER_SITE_OTHER): Payer: Medicare Other | Admitting: *Deleted

## 2010-11-20 DIAGNOSIS — I359 Nonrheumatic aortic valve disorder, unspecified: Secondary | ICD-10-CM

## 2010-11-20 DIAGNOSIS — Z954 Presence of other heart-valve replacement: Secondary | ICD-10-CM

## 2010-11-20 DIAGNOSIS — I059 Rheumatic mitral valve disease, unspecified: Secondary | ICD-10-CM

## 2010-11-20 DIAGNOSIS — Z9889 Other specified postprocedural states: Secondary | ICD-10-CM

## 2010-11-21 NOTE — Procedures (Signed)
   NAME:  [name], [name]                          ACCOUNT NO.:  [id number]   MEDICAL RECORD NO.:  [id number]                   PATIENT TYPE:  OUT   LOCATION:  RAD                                  FACILITY:  [facility]   PHYSICIAN:  Thomas C. Wall, M.D. [facility]            DATE OF BIRTH:  07-Nov-1945   DATE OF PROCEDURE:  DATE OF DISCHARGE:                                  ECHOCARDIOGRAM   INDICATION:  Valvular heart disease 424.0 and 424.1 and V43.3.   QUALITY OF STUDY:  The echocardiogram is of suboptimal quality.   CONCLUSIONS:  1. Mild left atrial enlargement.  2. Normal left ventricular chamber size and overall systolic function.     There are no segmental wall motion abnormalities.  No evidence of left     ventricular hypertrophy.  3. 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  There     is minimal aortic insufficiency.  Mean gradient is 10 mmHg.  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  4. Compared to previous study, there is no significant change.  5. Normal right-sided structures and function.                                               Thomas C. Daleen Squibb, M.D. [facility]    TCW/MEDQ  D:  10/04/2002  T:  10/05/2002  Job:  [id number]

## 2010-11-21 NOTE — Procedures (Signed)
   NAME:  [name], [name]                          ACCOUNT NO.:  [id number]   MEDICAL RECORD NO.:  [id number]                   PATIENT TYPE:  OUT   LOCATION:  RESP                                 FACILITY:  [facility]   PHYSICIAN:  Edward L. Juanetta Gosling, M.D.             DATE OF BIRTH:  01-Feb-1946   DATE OF PROCEDURE:  DATE OF DISCHARGE:  04/10/2002                              PULMONARY FUNCTION TEST   PROCEDURE:  Pulmonary function test.   INTERPRETED BY:  Ramon Dredge L. Juanetta Gosling, M.D.   FINDINGS:  1. Spirometry shows air-flow obstruction in the area of the small airways.     There is some larger air-flow obstruction but no evidence of a     ventilatory defect.  2. Lung volumes show no restrictive change but evidence of air trapping.  3. DLCO is severely reduced.  4. Arterial blood gases are normal.                                               Edward L. Juanetta Gosling, M.D.    ELH/MEDQ  D:  04/10/2002  T:  04/12/2002  Job:  [id number]

## 2010-11-21 NOTE — Procedures (Signed)
   NAME:  [name], [name]                          ACCOUNT NO.:  [id number]   MEDICAL RECORD NO.:  [id number]                   PATIENT TYPE:  INP   LOCATION:  A226                                 FACILITY:  [facility]   PHYSICIAN:  Gerrit Friends. Dietrich Pates, M.D. [facility]        DATE OF BIRTH:  [birth year]   DATE OF PROCEDURE:  01/31/2002  DATE OF DISCHARGE:                                  ECHOCARDIOGRAM   REFERRING PHYSICIAN:  Fredirick Maudlin, M.D., Gerrit Friends. Jena Gauss, M.D., Gerrit Friends. Dietrich Pates, M.D. 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   DESCRIPTION OF PROCEDURE:  1. Technically adequate echocardiographic study.  2. Right atrial size at the upper limit of normal; mild left atrial     enlargement. Normal right ventricular size and function with mild RVH.  3. Mechanical valve in the aortic position. Aortic insufficiency is present.     The magnitude of AI is difficult to determine, does not appear severe.  4. A mechanical valve is present in the mitral position. There is good disk     motion and normal hemodynamics. The presence of mitral regurgitation     cannot be determined due to shadowing caused by the prosthetic valve.  5. Normal tricuspid and pulmonic valves.  6. Normal internal dimension of the left ventricle; mild concentric LVH.     There is akinesis at the base of the inferior wall with preserved overall     LV systolic function.  The estimated ejection fraction is 0.55.     Spontaneous echocardiographic contrast is seen in the left ventricle.  7. Normal IVC.   IMPRESSION:  Biatrial enlargement; mechanical aortic and mitral valves  without substantial evidence for dysfunction, a transesophageal  echocardiogram would be helpful in determining the degree of mitral  regurgitation, if any. Other findings as noted.  Comparison with a prior  study of October 23, 2001, reveals interval aortic and  mitral valve surgery; a  small segmental wall motion abnormality is now present as described.                                                 Gerrit Friends. Dietrich Pates, M.D. [facility]    RMR/MEDQ  D:  01/31/2002  T:  02/02/2002  Job:  [id number]

## 2010-11-21 NOTE — Op Note (Signed)
[place]  Patient:    [name], [name] Visit Number: [id number] MRN: [id number]          Service Type: MED Location: 2300 2314 01 Attending Physician:  Cleatrice Burke Dictated by:   Alleen Borne, M.D. Proc. Date: 11/09/01 Admit Date:  11/02/2001   CC:         Dietrich Pates, M.D. [facility]  Cath lab   Operative Report  PREOPERATIVE DIAGNOSIS:  Severe aortic stenosis, severe mitral stenosis and three-vessel coronary artery disease with class 4 congestive heart failure.  POSTOPERATIVE DIAGNOSIS:  Severe aortic stenosis, severe mitral stenosis and three-vessel coronary artery disease with class 4 congestive heart failure.  OPERATION PERFORMED:  Median sternotomy, extracorporeal circulation, coronary artery bypass graft surgery times four using a left internal mammary artery graft to the left anterior descending coronary artery, with a saphenous vein graft to the diagonal branch of the left anterior descending, saphenous vein graft to the obtuse marginal branch of the left circumflex coronary artery, and saphenous vein graft to the distal right coronary artery; and aortic valve replacement using a 21 mm St. Jude Regent mechanical heart valve, and mitral valve replacement using a 25 mm St. Jude mechanical valve.  SURGEON:  Alleen Borne, M.D.  ASSISTANT:  Sherrie George, P.A.  ANESTHESIA:  General endotracheal.  INDICATIONS FOR PROCEDURE:  The patient is a 65 year old gentleman with a history of heart murmur and mild aortic stenosis that was diagnosed several years ago.  In 1996 he had a cardiac catheterization done that apparently did not show any significant coronary disease.  An echocardiogram in 1998 showed mild aortic stenosis and aortic insufficiency.  He now presents with a three to four-month history of progressive symptoms of congestive heart failure.  He underwent transesophageal echocardiogram on October 31, 2001 that  reportedly showed severe aortic stenosis with a mean gradient of 90 and a calculated aortic valve area of 0.3 cm squared.  There is also evidence of moderate mitral stenosis with a mean transvalvular gradient of 18 mHg and a calculated valve area of 1.5 square centimeters.  There was concentric left ventricular hypertrophy with overall preserved left ventricular function with an ejection fraction estimated at greater than 65%.  There was moderate left atrial enlargement.  He subsequently underwent cardiac catheterization electively on November 02, 2001.  This showed three-vessel coronary artery disease.  The LAD had a 70% proximal bifurcation stenosis at the take-off of a large diagonal branch.  The left circumflex had 60% proximal and midstenosis compromising a marginal branch.  The right coronary artery had 40 to 50% proximal and midvessel stenosis.  The gradient across the mitral valve was measured at 16.7 with a calculated valve area of 0.74 cm squared.  The gradient across the aortic valve was 353 mHg with a calculated valve area of 0.38 to 0.56 cm squared.  Ejection fraction was 49% with mild global hypokinesis and heavy calcification of the mitral valve and annulus.  The patients pulmonary artery pressures were severely elevated at 90/40 with a right ventricular pressure of 90/12, mean right atrial pressure of 7.  Left ventricular pressure was 274/20 and the aortic pressure was 196/96.  Cardiac index was 1.6.  After review of these studies and examination of the patient it was felt that coronary artery bypass graft surgery and aortic and mitral valve replacement was the best treatment.  I discussed the operative procedure with the patient and his wife and family including alternatives, benefits and  risks including bleeding, possible blood transfusion, infection, heart block requiring a permanent pacemaker, stroke, myocardial infarction, graft or valve failure and death. They understood  and agreed to proceed.  DESCRIPTION OF PROCEDURE:  The patient was taken to the operating room and placed on the table in supine position.  After induction of general endotracheal anesthesia, a Foley catheter was placed in the bladder using sterile technique.  Then the chest, abdomen and both lower extremities were prepped and draped in the usual sterile manner.  The chest was entered through a median sternotomy incision and the pericardium opened in the midline. Examination of the heart showed mild global hypokinesis involving the right and left ventricles.  Pulmonary pressure with the patient asleep was elevated about 50/30.  The ascending aorta had no palpable plaques in it.  Transesophageal echocardiogram was performed by anesthesiology and essentially showed the same findings that were seen on the preoperative TEE including severe aortic and mitral valve stenosis with extensive calcification of both valves.  The posterior mitral annulus was very heavily calcified.  There was mild aortic insufficiency.  There was no significant tricuspid regurgitation. There was moderate left ventricular hypertrophy.  Then the left internal mammary artery was harvested from the chest wall as a pedicle graft.  This was a medium-caliber vessel with excellent blood flow through it.  At the same time a segment of greater saphenous vein was harvested from the left leg and this vein was of medium size and good quality. We initially looked at the saphenous vein of the right ankle but this vein was small and unsuitable and was not harvested.  Then the patient was heparinized and when an adequate activated clotting time was achieved, the distal ascending aorta was cannulated using a 20 French aortic cannula for arterial inflow.  Venous outflow was achieved using a bicaval venous cannulation with a 28 mm metal right angle cannula placed through a pursestring suture in the superior vena cava and a 36 French  plastic  right angle cannula placed through a pursestring suture in the right atrium. An antegrade cardioplegia and vent cannula was inserted into the aortic root. A retrograde cardioplegia cannula was inserted through the right atrium into the coronary sinus.  The patient was placed on cardiopulmonary bypass and the distal coronary arteries were identified.  Exposure of these vessels was difficult due to the left ventricular hypertrophy.  The LAD was intramyocardial along its proximal and middle thirds.  It exited in the distal third almost at the apex.  There was a large anterior descending vein lying over the interventricular groove. The diagonal branch was a medium-sized graftable vessel.  The obtuse marginal branch was a medium-sized graftable vessel that was also difficult to expose because it was lying under a large epicardial vein.  The right coronary artery was diffusely diseased in its proximal and midportions but distally had no visible disease.  Then the aorta was cross-clamped and 300 cc of cold blood antegrade cardioplegia was administered in the aortic root with quick arrest of the heart.  This was followed by 300 cc of cold blood retrograde cardioplegia. Additional doses of retrograde cardioplegia were given throughout the case at approximately 20 to 30 minute intervals to maintain myocardial temperature around 10 to 15 degrees centigrade.  The first distal anastomosis was performed to the obtuse marginal branch.  The internal diameter was 1.75 mm.  The conduit used was a segment of greater saphenous vein.  Anastomosis was performed in end-to-side manner using continuous 7-0  Prolene suture.  The flow was measured through the graft and was excellent.  The second distal anastomosis was performed to the distal right coronary artery.  The internal diameter was about 2 mm.  The conduit used was a second segment of greater saphenous vein.  The anastomosis was performed in  an end-to-side manner using continuous 7-0 Prolene suture.  The flow was measured through the graft and was excellent.  The third distal anastomosis was performed to the diagonal branch.  The internal diameter was 1.75 mm.  The conduit used was the third segment of greater saphenous vein.  The anastomosis was performed in an end-to-side manner using a continuous 7-0 Prolene suture.  The flow was measured through the graft and was excellent.  The fourth distal anastomosis was performed to the distal portion of the left anterior descending coronary artery.  The internal diameter in this area was about 1.6 mm.  The conduit used was the left internal mammary graft and this was brought through an opening in the left pericardium anterior to the phrenic nerve.  The anastomosis was performed to the LAD in end-to-side manner using continuous 8-0 Prolene suture.  The anastomosis appeared hemostatic.  The pedicle was tacked to the epicardium with 6-0 Prolene sutures to prevent rotation.  Then attention was turned to the aortic valve.  The aortic root was opened through a transverse incision about 1 cm above the take-off of the right coronary artery.  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  There was fusion of all three commissures with marked calcification of the annulus and the leaflets which appeared essentially frozen.  There was a small opening between the leaflets but there was very poor leaflet mobility.  The native valve was excised.  The annulus was decalcified with rongeurs to allow suture placement. Care was taken to remove all particulate debris.  The aortic root and left ventricle were irrigated with iced saline solution.  A small gauze sponge was placed in the aortic root to prevent entrance of any debris.  Then attention was turned to mitral valve replacement.  The left atrium was opened through a vertical incision  was opened through a vertical incision through an interatrial groove.  The mitral valve retractor was placed.  There was good exposure of the mitral valve.  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  The leaflets were thickened and calcified.  The subvalvular apparatus was fused and shortened and thickened.  There was marked calcification of the entire mitral annulus but most pronounced along the posterior leaflets becoming less calcified towards the commissures.  This was essentially a bar of calcium in the posterior mitral valve annulus which extended into the atrioventricular groove and down into the posterior ventricular wall.  The anterior leaflet was excised with the subvalvular apparatus. The calcification of the annulus along the posterior leaflet was so severe that I did not feel that it would be possible to excise the posterior leaflet and this annular calcification without destroying the atrioventricular connection.  Therefore I decided to leave this posterior calcium in place.  The posterior leaflet was left intact. There was a small area along the free edge that was soft enough to place sutures.  The annulus was sized and a 25 mm St. Jude mechanical valve was chosen.  Then a series of 2-0 Ethibond horizontal mattress sutures were placed around the  annulus with the pledgets in the subannular position.  Along the posterior leaflet these sutures were placed directly through the free edge of the posterior leaflet.  Then posteriorly these sutures were placed through a strip of woven Dacron fabric to provide a buttress to this area which appeared somewhat friable due to the calcification.  These sutures were then placed through the sewing ring of the valve and the valve lowered in place.  These sutures were tied sequentially and the valve seated nicely.  Then the free edge of this Dacron fabric buttress was sewn  directly to the posterior left atrial wall just beyond the area of the posterior calcification of the annulus using continuous 4-0 Prolene suture.  This completely excluded this area of calcification and its raw friable surface and also hopefully, will prevent a  posterior perivalvular leak.  Then the left atrium was irrigated with iced saline solution.  There was no particulate debris.  The leaflets were functioning normally.  The left atrium was then closed in two layers of 3-0 Prolene suture but the two sutures were not tied and a vent was left in the left atrium.  Then attention was turned to the aortic valve.  The aortic annulus was sized and a 21 mm St. Jude Regent mechanical valve was chosen.  A series of pledgeted 2-0 Ethibond horizontal mattress sutures were placed around the annulus with the pledgets in the subannular position.  The sutures were placed through the sewing ring and the valve lowered into place.  The sutures were tied sequentially and the valve appeared to seat nicely.  The leaflets were functioning normally.  Then the aorta was closed in two layers using a continuous 3-0 Prolene suture pledgeted on both ends.  Then the patient was rewarmed to 37 degrees.  With the crossclamp in place, the three proximal vein graft anastomoses were performed at the aortic root in an end-to-side manner using continuous 6-0 Prolene suture.  After this completed, the left side of the heart was deaired through the atriotomy incision.  After this was complete, the atrial closure was secured by tying the sutures.  Then the head was placed in Trendelenburg position.  The clamp was removed from the mammary pedicle.  There was rapid warming of the ventricular septum and return of spontaneous ventricular fibrillation.  The crossclamp was removed with a time of 319 minutes.  There was spontaneous return of sinus rhythm.  The proximal and distal anastomoses appeared hemostatic.  The line of  the grafts satisfactory.  Graft markers were placed around the proximal anastomoses.  Two temporary right ventricular and right atrial pacing wires were placed and brought out through the skin.  When the patient had rewarmed to 37 degrees centigrade, the heart was filled with blood and allowed to eject to examine the prosthetic valves.  They appeared to be functioning normally with no evidence of perivalvular leak or regurgitation.  The right ventricle still appeared somewhat sluggish and therefore I decided to continue the patient on bypass for about another 30 minutes to allow the ventricle full recovery.  At the conclusion of this time, the patient was weaned from cardiopulmonary bypass on milrinone and dopamine. Cardiac function appeared good with cardiac output of 5 to 6L per minute. Left ventricular function appeared well preserved with some hypokinesis of the septum.  Protamine was   given and the venous and aortic cannulas were removed without difficulty. It should be noted that the patient was given aprotinin for this case.  After obtaining hemostasis, four chest tubes were placed with bilateral pleural tubes and a tube in the posterior pericardium and one in the anterior mediastinum.  The pericardium was reapproximated over the heart.  The sternum was closed with #6 stainless steel wires.  The fascia was closed with continuous #1 Vicryl suture.  The subcutaneous tissues were closed using continuous 2-0 Vicryl and the skin with 3-0 Vicryl subcuticular closure.  The lower extremity vein harvest site was closed in layers in a similar manner. The sponge, needle and instrument counts were correct according to the scrub nurse.  A dry sterile dressing was applied over the incisions and around the chest tubes which were hooked to Pleur-evac suction.  The patient remained hemodynamically stable and was transported to the SICU in guarded but stable condition. Dictated by:   Alleen Borne, M.D. Attending Physician:  Cleatrice Burke DD:  11/09/01 TD:  11/10/01 Job: [id number]

## 2010-11-21 NOTE — Op Note (Signed)
NAMEONOFRIO, KLEMP                          ACCOUNT NO.:  [id number]   MEDICAL RECORD NO.:  [id number]                   PATIENT TYPE:  AMB   LOCATION:  DAY                                  FACILITY:  [facility]   PHYSICIAN:  Gerrit Friends. Rourk, M.D.               DATE OF BIRTH:  [birth year]   DATE OF PROCEDURE:  04/05/2002  DATE OF DISCHARGE:                                 OPERATIVE REPORT   PROCEDURE:  Diagnostic colonoscopy.   ENDOSCOPIST:  Gerrit Friends. Rourk, M.D.   INDICATIONS FOR PROCEDURE:  The patient is a 65 year old gentleman with  marked anemia.  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  Attempted colonoscopy on 02/02/02 could  not be completed due to a poor prep.  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  He is on Coumadin.  Coumadin has been held for 4 days.  He is now undergoing diagnostic colonoscopy.  He has had occasional blood  per rectum in small volume.  Colonoscopy is now being done to further  evaluate his hematochezia and anemia.  This approach has been discussed with  the patient previously, and again at the bedside.  The potential risks,  benefits, and alternatives have been reviewed, questions answered.  He has  been off Coumadin for 4 days per cardiology's recommendations without  Lovenox.  He will be started on Lovenox after this procedure, today along  with Coumadin.   DESCRIPTION OF PROCEDURE:  O2 saturation, blood pressure, pulse and  respirations were monitored throughout the entire procedure.  SB  prophylaxis, ampicillin 2 gm IV, gentamicin 115 mg IV.   CONSCIOUS SEDATION:  Versed 4 mg IV, Demerol 100 mg IV in divided doses.   INSTRUMENT:  Olympus video chip adult colonoscope.   FINDINGS:  Digital rectal exam revealed no abnormalities.   ENDOSCOPIC FINDINGS:  The prep was good.   RECTUM:  Examination of the rectal  mucosa including the retroflex view of  the anal verge and __________ anal canal demonstrates internal hemorrhoids.  Otherwise the rectal mucosa appeared normal.   COLON:  The colonic mucosa was surveyed from the rectosigmoid junction  through the left transverse and right colon to the area of the appendiceal  orifice, ileocecal valve, and cecum.  These structures were well seen and  photographed for the record.  The patient had left-sided diverticula.  There  was a small 2 x 2 cm area patch of focal erythema within the area of the  sigmoid diverticula of doubtful clinical significance and photographed.   From the level of the cecum and ileocecal valve all previously mentioned  mucosal surfaces were again seen.  No other abnormalities were observed.  The patient tolerated the procedure well and was reacted in endoscopy.   FINDINGS:  1. Friable anal canal hemorrhoids, otherwise  normal rectum.  2. Left-sided diverticula.  Focal area of erythema adjacent to sigmoid     diverticula of doubtful clinical significance.  Right colonic mucosa     appeared normal.   RECOMMENDATIONS:  1. Resume Coumadin today.  2. Diverticulosis/hemorrhoid literature given to the patient.  3. Daily Metamucil or Citrucel fiber supplement.  4. A 10-day course of Anusol HC suppositories.  5. The patient is to go by the [facility] clinic today for instructions on     being given Lovenox this afternoon.                                               Gerrit Friends. Rourk, M.D.    RMR/MEDQ  D:  04/05/2002  T:  04/05/2002  Job:  [id number]   cc:   Gerrit Friends. Dietrich Pates, M.D. [facility]  [address]  [place]  [place] [address]  Fax: 1   Oneal Deputy. Juanetta Gosling, M.D.

## 2010-11-21 NOTE — Group Therapy Note (Signed)
[facility]  Patient:    [name], [name] Visit Number: [id number] MRN: [id number]          Service Type: MED Location: 2A A226 01 Attending Physician:  Annamarie Dawley Dictated by:   Kari Baars, M.D. Admit Date:  01/28/2002                               Progress Note  SUBJECTIVE:  [name] was admitted yesterday with congestive heart failure status post coronary artery bypass grafting, status post two valve replacements.  He says he feels a great deal better today after receiving blood.  It is felt that he has probably had a GI bleed.  He does not have another source of blood at this point, anyway.  PHYSICAL EXAMINATION  GENERAL:  He does indeed look a great deal better than yesterday.  He is not as pale.  VITAL SIGNS:  His blood pressure is up at 170/90, pulse 70, respirations 20.  CHEST:  Clear.  HEART:  Regular without gallop.  ABDOMEN:  Soft.  LABORATORY WORK:  Glucose 142.  Alkaline phosphatase 144, SGOT 73.  His hemoglobin this morning is 11.8 with a hematocrit of 33 up from previously.  PLAN:  Check stools for blood.  Continue with his other medications.  Give him medications to get his blood pressures down.  Follow from there.  He has a cardiology consultation ordered for tomorrow as well. Dictated by:   Kari Baars, M.D. Attending Physician:  Annamarie Dawley DD:  01/29/02 TD:  01/31/02 Job: [id number]

## 2010-11-21 NOTE — Procedures (Signed)
[facility]  Patient:    [name], [name] Visit Number: [id number] MRN: [id number]          Service Type: MED Location: 2A A226 01 Attending Physician:  Annamarie Dawley Dictated by:   Kari Baars, M.D. Proc. Date: 01/28/02 Admit Date:  01/28/2002                            EKG Interpretations  The rhythm is sinus rhythm with a rate in the 70s.  There are nonspecific T-wave changes most marked across the precordium laterally.  There is left axis deviation.  Abnormal electrocardiogram. Dictated by:   Kari Baars, M.D. Attending Physician:  Annamarie Dawley DD:  01/28/02 TD:  01/31/02 Job: [id number]

## 2010-11-21 NOTE — Group Therapy Note (Signed)
NAMEDASHAWN, BARTNICK NO.:  [id number]   MEDICAL RECORD NO.:  [id number]          PATIENT TYPE:  OBV   LOCATION:  A201                          FACILITY:  [facility]   PHYSICIAN:  Edward L. Juanetta Gosling, M.D.DATE OF BIRTH:  28-May-1946   DATE OF PROCEDURE:  03/14/2005  DATE OF DISCHARGE:                                   PROGRESS NOTE   The patient is in observation.  Mr. Douthit says he is feeling much better.  He is not short of breath now.  He has no congestion, no cough, and he feels  much stronger.   PHYSICAL EXAMINATION:  VITAL SIGNS:  Show his temperature is 98.3, pulse 74,  respirations are 18, blood sugar 117, blood pressure 187/83.  CHEST:  Much clearer.  HEART:  Regular.  He looks better.   His hemoglobin level, after blood transfusion, is greater than 10.  His lab  work does look like this may well be iron deficiency but he has never had  positive stools, they have been checked on multiple occasions.  He has not  had a stool here in the hospital as yet.  After discussion with Mr. Boger,  he says he thinks he can go home.  I am going to plan to go and discharge  him.  He is going to need a workup of his probable iron-deficiency anemia as  an outpatient.      Edward L. Juanetta Gosling, M.D.  Electronically Signed     ELH/MEDQ  D:  03/14/2005  T:  03/14/2005  Job:  [id number]

## 2010-11-21 NOTE — Group Therapy Note (Signed)
   NAME:  [name], [name]                          ACCOUNT NO.:  [id number]   MEDICAL RECORD NO.:  [id number]                   PATIENT TYPE:  INP   LOCATION:  A226                                 FACILITY:  [facility]   PHYSICIAN:  Fredirick Maudlin, M.D.              DATE OF BIRTH:  08-14-1945   DATE OF PROCEDURE:  DATE OF DISCHARGE:  02/03/2002                                   PROGRESS NOTE   PROBLEM:  Shortness of breath, __________ , anemia.   SUBJECTIVE:  This patient says that he is feeling pretty well.  He has no  other complaints.  He had his EGD yesterday.  His colonoscopy could not be  fully performed because he had a lot of iron staining in the colon. I have  discussed all this with Dr. Karilyn Cota.  He feels that he needs __________ 4-8  weeks until he can repeat this exam.  Considering that I am going to plan on  having the patient call me.  We originally planned to put him on Lovenox  __________ therapeutic __________ .   OBJECTIVE:  Exam this morning shows that his blood pressure is 120/70, pulse  is 80.  His chest is fairly clear.  His heart is regular with prosthetic  valve signs.  His labs show his hemoglobin 10.1, __________ is 200. His  prothrombin time 17.6 with an INR of 2.   ASSESSMENT AND PLAN:  The plan is for discharge home.  He is going to be on  Coumadin.  He is going to be on his other medications, multivitamins with  iron.                                                Fredirick Maudlin, M.D.    ELH/MEDQ  D:  02/03/2002  T:  02/11/2002  Job:  [id number]

## 2010-11-21 NOTE — Group Therapy Note (Signed)
   NAME:  [name], [name] NO.:  [id number]   MEDICAL RECORD NO.:  [id number]                   PATIENT TYPE:  INP   LOCATION:  A226                                 FACILITY:  [facility]   PHYSICIAN:  Fredirick Maudlin, M.D.              DATE OF BIRTH:  1946-06-09   DATE OF PROCEDURE:  DATE OF DISCHARGE:  02/03/2002                                   PROGRESS NOTE   PROBLEMS:  Congestive heart failure, anemia, status post valve replacements  x2, coronary artery disease.   SUBJECTIVE:  The patient says he is feeling okay and has no new complaints  this morning. He denies any nausea, vomiting, fevers or chills, abdominal or  chest pain.   OBJECTIVE:  His chest is clear. His heart is regular. His abdomen soft. He  is set for a EGD and colonoscopy later today.   PLAN:  No changes in his treatments until we see what his colonoscopy and  EGD show.                                               Fredirick Maudlin, M.D.    ELH/MEDQ  D:  02/01/2002  T:  02/07/2002  Job:  [id number]

## 2010-11-21 NOTE — Cardiovascular Report (Signed)
[place]  Patient:    [name], [name] Visit Number: [id number] MRN: [id number]          Service Type: MED Location: 2000 2028 01 Attending Physician:  Veneda Melter Dictated by:   Daisey Must, M.D. [facility] Proc. Date: 11/02/01 Admit Date:  11/02/2001   CC:         Lewis P. Esmeralda Arthur, M.D., [place], [place]  Dietrich Pates, M.D. [facility]  Cardiac Catheterization Lab   Cardiac Catheterization  PROCEDURES PERFORMED:  Right and left heart catheterization with coronary angiography, left ventriculography, aortic root angiography, and abdominal aortography.  INDICATIONS:  Mr. [name] is a 65 year old male with symptomatic aortic and mitral stenosis.  PROCEDURAL NOTE:  An #8 [place] sheath was placed in the right femoral vein, a #7 French sheath in the right femoral artery.  Right heart catheterization was performed with a Swan-Ganz catheter.  Left heart catheterization was performed with standard Judkins #6 French catheters.  Simultaneous measurements of the left ventricular end diastolic pressure and pulmonary capillary wedge pressure were obtained to calculate the mitral valve area.  The aortic valve area was calculated by both measuring simultaneous pressures of the left ventricle and femoral artery pressure as well as on aortic valve pullback.  Contrast was Omnipaque.  There were no complications.  RESULTS:  HEMODYNAMICS: 1. Right atrial mean pressure 7. 2. Right ventricular pressure 90/12. 3. Pulmonary artery pressure 90/40. 4. Pulmonary capillary wedge mean pressure 29. 5. Left ventricular pressure 274/20. 6. Aortic pressure 196/96.  CARDIAC OUTPUT/INDEX:  The cardiac output by the thermodilution method is 4.2. Cardiac index 2.3.  Cardiac output by the Fick method is 2.9.  Cardiac index of 1.6.  Pulmonary artery saturation is 54%.  The mean gradient measured across the mitral valve is 16.7 mmHg.  The mitral valve area calculated utilizing the  Fick cardiac output is 0.74 cm sq. Utilizing the thermodilution cardiac output, the mitral valve area is 1.07 cm sq.  The mean gradient across the aortic valve on aortic valve pullback was 53 mmHg.  The aortic valve area calculated utilizing the Fick cardiac output was 0.38 cm sq.  Utilizing the thermodilution cardiac output, the aortic valve area is 0.56 cm sq.  LEFT VENTRICULOGRAM:  Left ventriculogram reveals mild global hypokinesis with significant left ventricular hypertrophy.  Ejection fraction calculated at 49%.  There is trace mitral regurgitation.  The mitral valve is very heavily calcified with decreased mobility.  AORTIC ROOT ANGIOGRAPHY:  Aortic root angiography reveals a tricuspid aortic valve which is also heavily calcified with significantly decreased mobility of the aortic valve leaflets.  There is 1+ mild aortic insufficiency.  ABDOMINAL AORTOGRAM:  An abdominal aortogram reveals a 50% stenosis in the ostium of the left renal artery.  The right renal artery is patent.  There is mild diffuse atherosclerotic disease of the abdominal aorta as well as the iliac arteries.  CORONARY ARTERIOGRAPHY (right dominant): 1. The left main is short but normal.  2. The left anterior descending artery has a 70% stenosis in the proximal    vessel just proximal to a large first diagonal branch.  The LAD gives rise    to a single large diagonal branch.  3. The left circumflex has a tubular 60% stenosis in the mid vessel extending    into OM-2.  The circumflex gives rise to a small OM-1 and large OM-2.  OM-2    has a 60% stenosis proximally.  4. The right coronary artery has moderate diffuse  disease with a 40% stenosis    in the proximal vessel, diffuse 50% in the mid vessel, and 30% stenosis in    the distal vessel.  The distal right coronary artery gives rise to a large    acute marginal branch supplying the distal portion of the inferior septum.    There is also a small posterior  descending artery and a small    posterolateral branch.  IMPRESSIONS: 1. Severe pulmonary hypertension. 2. Severe mitral stenosis. 3. Severe aortic stenosis with mild aortic insufficiency. 4. Mildly decreased left ventricular systolic function. 5. Moderate two-vessel coronary artery disease as described.  PLAN:  The patient will be admitted to the hospital and cardiovascular surgery will be consulted to evaluate for aortic and mitral valve replacement with coronary bypass grafting. Dictated by:   Daisey Must, M.D. [facility] Attending Physician:  Veneda Melter DD:  11/02/01 TD:  11/04/01 Job: [id number]

## 2010-11-21 NOTE — Consult Note (Signed)
[place]  Patient:    [name], [name] Visit Number: [id number] MRN: [id number]          Service Type: MED Location: 2000 2028 01 Attending Physician:  Veneda Melter Dictated by:   Salvatore Decent. Cornelius Moras, M.D. Proc. Date: 11/04/01 Admit Date:  11/02/2001   CC:         Alleen Borne, M.D.Damaris Schooner, M.D.  Damaris Schooner, M.D.  Daisey Must, M.D. [facility]  Dietrich Pates, M.D. [facility]   Consultation Report  REFERRING PHYSICIAN:  Daisey Must, M.D.  PHYSICIANS: 1. Dietrich Pates, M.D., primary cardiologist. 2. Damaris Schooner, M.D., primary care physician.  REASON FOR CONSULTATION:  Severe aortic stenosis, moderate to severe mitral stenosis, two vessel coronary artery disease with class IV congestive heart failure.  HISTORY OF PRESENT ILLNESS:  Mr. Schurman is a 65 year old white male from [place], [place] with history of heart murmur and mild aortic stenosis apparently diagnosed several years ago.  He underwent an echocardiogram in 1998 which reportedly showed mild aortic stenosis and aortic insufficiency at that time.  He has undergone cardiac catheterization in 1996 which apparently did not show any significant coronary artery disease.  He has been in his usual state of health until over the last three to four months he has developed progressive symptoms of congestive heart failure.  He reports worsening exertional shortness of breath and fatigue, which has gotten quite problematic over the last several weeks.  Recently, he has developed episodes of sudden shortness of breath which awakens him from his sleep at night for which he has to get up and sit up in bed or on the side of his bed. He has also developed several dizzy spells, although he has not suffered any true syncopal episodes.  Associated with these symptoms, he has developed some exertional tightness in his chest which developed with strenuous physical activity.  This is always  relieved with rest.  He denies any history of resting shortness of breath, palpitations, syncope, or history of lower extremity edema.  He was evaluated by Dr. Dietrich Pates on October 28, 2001.  It was felt that he had signs and symptoms worrisome for progression of aortic stenosis.  He underwent a transesophageal echocardiogram on October 31, 2001 at [facility]. By report, this test documented severe aortic stenosis with an estimated mean transvalvular gradient of 90 mmHg and a calculated valve area of 0.3 sq/cm. There was also evidence of moderate mitral stenosis with a mean transvalvular gradient of 18 mmHg and a valve area calculated at 1.5 sq/cm.  There remained concentric left ventricular hypertrophy with overall preserved left ventricular function and ejection fraction estimated at greater than 65%. There was moderate mitral enlargement and mild to moderate aortic insufficiency.  Mr. Hinnant was brought in for elective left and right heart catheterization on November 02, 2001 by Dr. Loraine Leriche Pulsipher.  This confirmed the presence of severe aortic stenosis and moderate mitral stenosis.  There is mild aortic insufficiency.  There was two vessel coronary artery disease and left ventricular function was preserved.  Coronary anatomy was notable for 70% proximal stenosis of the left anterior descending coronary artery arising just before takeoff of a large first diagonal branch.  There is 60-70% stenosis of the mid-left circumflex coronary artery and 60-70% stenosis of the ostial portion of a large first circumflex marginal branch.  There is 30-50% proximal stenosis of the right coronary artery with right dominant coronary circulation.  The aortic stenosis was measured  at catheterization and notable for a mean transvalvular gradient of 53 mmHg.  Calculated aortic valve area varied between 0.38 and 0.56 sq/cm based upon calculation of resting cardiac output.  Corresponding transvalvular  gradient across the mitral valve was notable for a mean gradient of 16.7 mmHg corresponding to a mitral valve area of 0.4 sq/cm to 1.07 sq/cm.  There was pulmonary hypertension with resting PA pressures of 90/40.  Resting cardiac output measured 4.2 liters per minute corresponding to a cardiac index of 2.3 by thermodilution.  Using the Fick method, the cardiac output was 2.9 with a cardiac index at 1.6.  The baseline pulmonary artery mix venous saturation was 54%.  The pulmonary capillary wedge pressure was 29.  Cardiac surgical consultation was requested.  The patient has specifically requested that Dr. Alleen Borne be his surgeon.  REVIEW OF SYSTEMS:  GENERAL:  The patient reports otherwise feeling well with the exception of his worsening fatigability and exertional shortness of breath.  RESPIRATORY:  Notable for occasional productive cough, usually in the morning.  Last week, he had one brief episode of a small amount of gross hemoptysis.  GASTROINTESTINAL:  Essentially negative.  The patient reports good appetite with normal bowel function.  He denies any history of hematochezia, hematemesis, or melena.  NEUROLOGIC:  Negative.  The patient denies any symptoms of amaurosis fugax.  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  GENITOURINARY:  Negative.  The patient has had kidney stones in the distant past.  INFECTIOUS:  Negative.  The patient denies recent fevers or chills.  HEMATOLOGIC:  Negative with the exception of one episode of severe epistaxis recently which required nasal packing. Otherwise, the patient reports no problems with bleeding diaphysis or easy bruising.  ENDOCRINE:  Negative.  PSYCHIATRIC:  Negative.  PERIPHERAL VASCULAR:  Negative.  HEENT:  Negative.  The patient has a full set of upper and lower dentures.  He wears glasses.   PAST MEDICAL HISTORY:  Notable for hypertension and long-standing heavy tobacco abuse.  The patient denies any  known history of myocardial infarction. He likely had several episodes of congestive heart failure over the last several years and apparently has been evaluated in the emergency room for severe shortness of breath within the last year.  No details of these evaluations are available.  The patient does not have any history of diabetes or hyperlipidemia.  He reports no history of previous stroke or transient ischemic attack.  PAST SURGICAL HISTORY:  Notable for bilateral inguinal hernia repair several years ago.  FAMILY HISTORY:  Notable for the absence of early onset of coronary artery disease.  He does have several family members with history of congestive heart failure and coronary artery disease.  CHILDHOOD ILLNESSES:  The patient denies any known history of rheumatic fever or Scarlet fever.  SOCIAL HISTORY:  The patient lives [place] in [place]  He has one daughter.  He works for a [employer] which requires fairly demanding physical labor.  He has a longstanding history of heavy tobacco abuse, smoking more than one and one-half pack of cigarettes per day since the age of 48.  He reports no history of excessive alcohol consumption.  MEDICATIONS: 1. Lopressor 100 mg p.o. q.h.s. 2. Zantac 150 mg p.o. q.d. 3. Aspirin 325 mg p.o. q.d.  ALLERGIES:  The patient denies any known drug allergies or sensitivities.  PHYSICAL EXAMINATION:  GENERAL:  Notable for a well-appearing, thin white male who appears  his stated age and in no acute distress.  VITAL SIGNS:  He is normal sinus rhythm by telemetry monitor and has remained afebrile throughout his hospitalization.  HEENT:  Notable for full set of upper and lower dentures.  He wears glasses.  NECK:  The neck is supple.  There is no cervical or supraclavicular lymphadenopathy.  There is moderate jugular venous distension.  No carotid bruits are noted.  CHEST:  Auscultation of the chest reveal scattered  expiratory crackles bilaterally.  No wheezes or rhonchi are noted.  CARDIOVASCULAR:  Demonstrates regular rate and rhythm.  There is a loud grade 4/6 harsh systolic murmur heard best along the entire sternal border with radiation to the neck and across the precordium.  No diastolic murmurs are appreciated.  ABDOMEN:  Soft and nontender.  The liver edge is not enlarged or tender. There are no palpable masses.  EXTREMITIES:  The extremities are warm and well perfused.  There is no lower extremity edema.  PULSES:  Distal pulses are easily palpable on both lower extremities at the ankle.  There is no venous insufficiency.  SKIN:  The skin is clean and dry and healthy-appearing throughout.  NEUROLOGICAL:  Grossly nonfocal and symmetrical throughout.  RECTAL:  Deferred.  GENITOURINARY:  Deferred.  LABORATORY DATA:  Baseline blood work obtained October 31, 2001 includes a complete blood count with baseline hemoglobin of 11.5, hematocrit 40%, white blood count 6700, platelet count 336,000.  Serum electrolytes include sodium 139, potassium 5.4, chloride 104, bicarb 22, BUN 17, creatinine 0.9, and glucose of 107.  Lipid profile is notable for a total cholesterol of 218 with triglycerides 90, HDL cholesterol of 48, and calculated LDL cholesterol of 152.  Liver function profile is within normal limits with the exception of mildly elevated alkaline phosphatase at 137.  Baseline coagulation profile is notable for a prothrombin time of 15.2 seconds with INR of 1.5.  Chest x-ray performed October 31, 2001 reportedly demonstrates cardiac enlargement with stable pulmonary vascular congestion but no acute pulmonary findings.  A 12-lead electrocardiogram demonstrates normal sinus rhythm with left atrial enlargement and nonspecific ST segment changes diffusely.  IMPRESSION:  Severe aortic stenosis with mild aortic insufficiency and moderate to severe mitral stenosis with associated class IV symptoms  of congestive heart failure and with at least class III symptoms of progressive angina.  Mr. Kucher has two vessel coronary artery disease and preserved left ventricular function.  He certainly would benefit from aortic valve replacement and mitral valve replacement as well, although conceivable that mitral valve repair could be accomplished.  His catheterization and echocardiogram findings are suggestive of rheumatic heart disease which may decrease the likelihood that mitral valve repair would be feasible.  Left ventricular function appears relatively preserved.  His associated comorbid conditions are notable for longstanding tobacco abuse with likely chronic obstructive pulmonary disease complicating his baseline, moderate pulmonary hypertension related to his underlying cardiac disease.  Overall, he represents relatively good candidate for surgical intervention.  Based upon his age and freedom from relative contraindications for long-term anticoagulation, mechanical prosthetic valve replacement seems the likely appropriate choice under the circumstances.  PLAN:  I have outlined at length the indications and potential benefits of aortic valve replacement, mitral valve repair or replacement, and coronary artery bypass grafting with Mr. Egerton and his family.  The alternative treatment strategies have been discussed.  The long-term prognosis with medical therapy has been reviewed.  They understand and accept all associated risks of surgery including, but not limited to,  risk of death, stroke, myocardial infarction, congestive heart failure, respiratory failure, pneumonia, bleeding requiring blood transfusion, arrhythmia, heart block or bradycardia requiring permanent pacemaker, recurrent coronary artery disease, prosthetic valve related complications, infection.  All their questions have been addressed.  Relative risks and benefits of mechanical prosthetic valve replacement with  associated need for long-term anticoagulation have been  discussed and compared with biprosthetic tissue valve replacement.  Mr. Belsito desires mechanical valve placement if felt appropriate at the time of surgery. All of their questions have been addressed.  Mr. Heidrich desires that Dr. Alleen Borne perform this surgery.  We will tentatively schedule this for next week when Dr. Alleen Borne returns from vacation. Dictated by:   Salvatore Decent Cornelius Moras, M.D. Attending Physician:  Veneda Melter DD:  11/04/01 TD:  11/06/01 Job: [id number]

## 2010-11-21 NOTE — Discharge Summary (Signed)
[name], [name] NO.:  [id number]   MEDICAL RECORD NO.:  [id number]          PATIENT TYPE:  OBV   LOCATION:  A201                          FACILITY:  [facility]   PHYSICIAN:  Edward L. Juanetta Gosling, M.D.DATE OF BIRTH:  1945-10-29   DATE OF ADMISSION:  03/13/2005  DATE OF DISCHARGE:  09/09/2006LH                                 DISCHARGE SUMMARY   FINAL DISCHARGE DIAGNOSES:  1.  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  2.  Chronic obstructive pulmonary disease.  3.  Mitral and aortic valve replacements.  4.  Congestive heart failure.  5.  Coronary artery occlusive disease.  6.  Hypertension.  7.  Hyperlipidemia.  8.  Sinus tachycardia.   HISTORY:  [name] is a 65 year old with long known history of COPD,  coronary artery occlusive disease, valve replacement, who has had a history  of anemia.  He was in his usual state of health at home, developed  increasing shortness of breath and weakness, and when he came to the  emergency room he was noted to have a hemoglobin level of 8.3.  He has had  several workup's of his anemia.  We have never found anything so far, but  hemolysis.   PHYSICAL EXAMINATION:  VITAL SIGNS:  Blood pressure 185/59, pulse 109,  respirations 22, O2 saturation was 98%.  HEENT:  Pretty much unremarkable.  CHEST:  Respiratory rate in the 20s with mildly labored.  HEART:  Prosthetic valve sounds, no gallop.  EXTREMITIES:  No edema.  NEUROLOGIC:  Grossly intact.   LABORATORY DATA:  EKG showed sinus tachycardia.   HOSPITAL COURSE:  He was given 2 units of packed red blood cells and showed  resolution of almost all of his symptoms.  His blood pressure remained  somewhat high, and this is going to be re-evaluated also as an outpatient.  He had an anemia profile done which did not show evidence of vitamin B12 or  folic acid deficiency, but did show what appeared to be low iron levels.  He  is  discharged home on a multivitamin one daily, folic acid 400 mcg daily,  iron tablet one daily, omeprazole 40 mg daily, Lisinopril 30 mg daily,  metoprolol 50 mg daily, Zetia 10 mg daily,  aspirin 81 mg daily, Zocor 40 mg daily, Coumadin as directed, Wellbutrin two  tablets at bedtime 150 mg.  He is going to have a GI workup as an outpatient  to be sure that he does not have some sort of blood loss.  As mentioned,  this has been worked up in the past, and we have not been able to prove that  he had blood loss.      Edward L. Juanetta Gosling, M.D.  Electronically Signed     ELH/MEDQ  D:  03/14/2005  T:  03/14/2005  Job:  [id number]

## 2010-11-21 NOTE — Discharge Summary (Signed)
NAME:  [name], [name] NO.:  [id number]   MEDICAL RECORD NO.:  [id number]                   PATIENT TYPE:  INP   LOCATION:  A226                                 FACILITY:  [facility]   PHYSICIAN:  Fredirick Maudlin, M.D.              DATE OF BIRTH:  10/11/1945   DATE OF ADMISSION:  01/28/2002  DATE OF DISCHARGE:  02/03/2002                                 DISCHARGE SUMMARY   DISCHARGE DIAGNOSES:  1. Congestive heart failure.  2. Anemia, multifactorial, increased with some hemolysis.  3. 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  4. Hyperlipidemia.   HISTORY OF PRESENT ILLNESS:  The patient presented to the emergency room  with shortness of breath.  The patient is status post one month bypass  surgery and __________.  He came to the emergency room because he had  __________problems.  In the emergency room he had a chest x-ray which showed  congestive heart failure.  It did show some mild CHF but no pneumonia.  PO2  of 93, pH of 7.5, PCO2 0.7.  His hemoglobin, however, was 7.9, it was 9.6  when he was discharged home after surgery.  The patient says that he is  short of breath, and he presents to the hospital today.  Heart sounds are  regular. The patient's bowel sounds are active and abdomen is soft.   HOSPITAL COURSE:  He was treated with Lasix and he improved.  His shortness  of breath resolved after the anemia improved.  He underwent cardiology  consultation who thought there may be a problem around the valve.  We also  got a GI consult and underwent EGD which did not show evidence of bleeding.   Partial colonoscopy because of __________ and will be rescheduled.   LABORATORY WORK ON ADMISSION:  PT of 8.9, INR of 2.3.  White count 6300,  hemoglobin of 7.9, MCV of 99. Platelets 257.   With the improvement in his symptoms, he was discharged home in improved  condition.   DISCHARGE MEDICATIONS:  1. Protonix 40 mg q.d.  2. Folic acid.  3. Multivitamin one daily.  4. Coumadin 5 mg daily.  5. Xanax 0.5 mg q.i.d. p.r.n.  6. Iron over-the-counter.  7. Altace 5 mg q.d.  8. Zocor 20 mg q.d.  9. Lorcet 10/500 mg one q.i.d. p.r.n. pain.  10.      Toprol XL 50 mg one q.d.    FOLLOWUP:  1. In three days at the cardiology office for a cardiology consultation.  2. He is to follow up in my office in about a month.  Fredirick Maudlin, M.D.    ELH/MEDQ  D:  02/03/2002  T:  02/09/2002  Job:  [id number]

## 2010-11-21 NOTE — H&P (Signed)
[facility]  Patient:    [name], [name] Visit Number: [id number] MRN: [id number]          Service Type: MED Location: 2A A226 01 Attending Physician:  Annamarie Dawley Dictated by:   Marcene Corning, M.D. Admit Date:  01/28/2002   CC:         Dietrich Pates, M.D. [facility]  Kari Baars, M.D.   History and Physical  CHIEF COMPLAINT:  "I have been getting some shortness of breath when I try to walk over the last few days and is getting worse.  I had some dizziness yesterday and today."  HISTORY OF PRESENT ILLNESS:  Mr. Aldredge is a 65 year old white male with a past medical history significant for coronary artery bypass grafting with aortic valve repair and mitral valve repair May 2003 in [place], [place].  He also has a history of hypertension, history of class IV congestive heart failure, and hypercholesterolemia.  He is followed by Dr. Juanetta Gosling for his primary care.  He was recently a patient of Dr. Moss Mc.  He is followed by Dr. Tenny Craw for his cardiology care.  The patient states that since his coronary artery bypass graft and valve replacement he has been doing relatively well.  His wife states he does lift heavy objects at times and does seem to strain himself at times.  She also states he overexerts himself at times with strenuous activity.  The patient states that over the past few days he has been having episodes of dyspnea on exertion.  He denies any shortness of breath at rest.  He denies any wheezing. He is taking his medications as directed.  He denies any chest pain, tightness, or pressure except with pains associated with his wound secondary to his coronary artery bypass grafting.  He also states he had an episode of dizziness yesterday, lasting a couple of seconds when he tried to stand up quickly.  He also had an episode of dizziness this morning.  Both of these lasted only less than one minute and were not associated with any  blurred vision, no chest pain, tightness, or pressure, and no falls or trauma. Because of his worsening symptoms he was brought to the emergency room for evaluation.  In the ER he had a chest x-ray which showed no CHF, no pneumonia.  He had an arterial blood gas showed pO2 93.3 on room air.  His pH was 7.5, pCO2 27.8 consistent with a respiratory alkalosis.  His hemoglobin was 7.9.  His hemoglobin was 9.6 when he was discharged from [facility] in May 2003.  He states he has had one episode of bright red blood per rectum approximately one month ago, but none since that time.  He denied any melena.  He denied any dark stools.  He denied any hemoptysis or hematemesis.  Because of his probable symptomatic anemia, it was felt that admission for transfusion and recurrent hemoglobin was indicated.  REVIEW OF SYSTEMS:  He had occasional headaches, nothing severe.  No recent change in his vision or hearing.  No blurred vision.  No sore mouth.  No sore throat.  He has pains at times associated with his wound, but no exertional chest pain, tightness, or pressure.  No abdominal.  No significant constipation, diarrhea.  No hematuria, dysuria, melena, or bright red blood per rectum.  No complaints of pain in extremities or joints.  No fever, chills, nausea, vomiting, or weight loss.  PAST MEDICAL HISTORY:  Dictated below.  PAST  SURGICAL HISTORY:  Dictated below.  ALLERGIES:  None.  MEDICATIONS: 1. Altace 5 mg q.d. 2. Zocor 20 mg q.d. 3. Coumadin 5 mg q.d. 4. Lorcet Plus tablets for pain. 5. Toprol XL 50 mg q.d.  FAMILY HISTORY:  Notable for absence of early onset coronary artery disease. He does have several family members with congestive heart failure and coronary artery disease.  SOCIAL HISTORY:  The patient lives with his wife here in [place].  She is with him in the emergency room today.  He has one daughter.  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  He had a history of heavy tobacco abuse, smoking probably two to three packs of cigarettes per day since the age of 30.  He has no history of excessive alcohol consumption or other illegal drug use or abuse.  PHYSICAL EXAMINATION  VITAL SIGNS:  In the emergency room he had vitals that showed blood pressure to be elevated at 192/97 and repeat 178/86.  His temperature was 97, pulse 75, saturation 97% on room air.  GENERAL:  He is a well-developed white male in no apparent distress.  HEENT:  Normocephalic, atraumatic.  Pupils equal, round, react to light. Conjunctivae were clear.  Sclerae were nonicteric.  Oropharynx is clear. Mucous membranes moist.  NECK:  Supple.  No lymphadenopathy.  No thyromegaly.  No JVD.  No bruits.  LUNGS:  Decreased breath sounds bilaterally, but no rales or wheezes.  HEART:  Regular rate and rhythm.  He has two murmurs noted, one at the left sternal border and one at the right upper sternal border.  I could hear the mechanical valve operation.  ABDOMEN:  Soft, nontender, nondistended.  No masses.  GENITOURINARY:  Normal male.  RECTAL:  Stool in the vault which was guaiac negative per ER M.D.  EXTREMITIES:  Show his wounds from his bypass graft to be healing well bilaterally.  There is no significant edema in his lower extremities.  NEUROLOGIC:  He is alert and oriented x3.  Cranial nerves II-XII are grossly intact.  Sensory and motor intact in the upper and lower extremities bilaterally with no asymmetry noted.  Cerebellar examination was not tested.  LABORATORIES:  Chest x-ray showed cardiomegaly and coronary artery bypass grafting, but no evidence of any CHF or infiltrates.  His EKG showed a sinus rhythm and no ischemic changes.  His rate was in the 70s.  His D-dimer is less than 0.5.  PT was 18.9 with INR 2.3, PTT 35.  CBC showed  white cell count 6.3, hemoglobin 7.9, MCV 91, platelets 267,000.  His sodium was very slightly  decreased at 134, potassium 3.9, BUN 8, creatinine 0.9, chloride 102, bicarbonate 31, glucose 106.  AST slightly elevated 49, alkaline phosphatase slightly elevated 124, total bilirubin slightly elevated 1.7.  CPK 63.  His arterial blood gas showed pH 7.5, pCO2 27.8, pO2 93.9, bicarbonate 22.3 on room air.  PROBLEM LIST: 1. Dyspnea on exertion probably secondary to symptomatic anemia with no other    evidence of congestive heart failure or pneumonia or any respiratory    abnormalities. 2. Anemia with one episode of bright red blood per rectum approximately one    month ago, but none since that time with hemoglobin 7.9 now.  He is on    Coumadin secondary to valve replacement with INR now of 2.3. 3. Status post coronary artery bypass grafting x4 and status post aortic valve    replacement and  mitral valve replacement May 2003 at [facility].  No    history of myocardial infarction. 4. History of congestive heart failure class IV which seems to be clinically    compensated at this time. 5. Hypertension on Altace, Toprol as noted above.  His blood pressure is    ranging from the 170s-190s and diastolic of 80s-90s now. 6. History of cigarette abuse, but none since May 2003. 7. Status post bilateral hernia repairs. 8. Elevated liver function tests as noted above, all very slightly elevated.    Questionable significance of these. 9. Hypercholesterolemia on Zocor.  PLAN:  I admit him to a telemetry bed, transfuse him 2 units of packed red blood cells and I will use Lasix in between those units to prevent any heart failure as I do not know what his ejection fraction is at this time.  Recheck the hemoglobin after the transfusion and recheck a hemoglobin in the morning. I will continue his Coumadin for now secondary to his valve replacement as he definitely has to be on anticoagulation and continue his other home medications for now.  I will adjust his blood pressure medicines today if needed  and use p.r.n. clonidine.  If he continues to bleed and his hemoglobin continues to drop we will have to get GI consult and cardiology consult.  I will repeat his liver function tests in the morning.  I will not do any work-up for those slightly elevated LFTs at this time. Dictated by:   Marcene Corning, M.D. Attending Physician:  Annamarie Dawley DD:  01/28/02 TD:  01/30/02 Job: [id number]

## 2010-11-21 NOTE — Op Note (Signed)
NAME:  [name], [name]                          ACCOUNT NO.:  [id number]   MEDICAL RECORD NO.:  [id number]                   PATIENT TYPE:  INP   LOCATION:  A226                                 FACILITY:  [facility]   PHYSICIAN:  Lionel December, M.D.                 DATE OF BIRTH:  [birth year]   DATE OF PROCEDURE:  02/02/2002  DATE OF DISCHARGE:  02/03/2002                                 OPERATIVE REPORT   PROCEDURE:  Esophagogastroduodenoscopy followed by colonoscopy which is  incomplete due to very poor prep.   INDICATIONS FOR PROCEDURE:  [name] is a 65 year old Caucasian male who  presented with profound anemia. He also gives a history of hematochezia as  well as GI __________ symptoms quiescent lately. It appears is anemia may be  primarily due to hemolysis. Given his GI symptomatology, he is undergoing a  diagnostic EGD and colonoscopy. The procedure was reviewed with the patient,  informed consent was obtained. He has two prostatic valves and therefore was  given SBE prophylaxis.   PREOP MEDICATIONS:  Cetacaine spray for pharyngeal topical anesthesia,  Demerol 50 mg IV and Versed 4 mg IV.   INSTRUMENT:  Olympus video system.   FINDINGS:  The procedure performed in the Endoscopy Suite. The patient's  vital signs and O2 sats were monitored during the procedure and remained  stable.   1. Esophagogastroduodenoscopy.   DESCRIPTION OF PROCEDURE:  The patient was placed in the left lateral  decubitus position and the endoscope was passed through the oropharynx  without any difficulty into the esophagus.   ESOPHAGUS:  The mucosa of the esophagus normal except distally he had a 5-6  mm submucosal nodule suspicious for leiomyoma. This was left alone. The  squamocolumnar junction was unremarkable. No ring or stricture was noted.   STOMACH:  It was empty and distended very well with insufflation. The folds  in the proximal stomach were normal. Examination of the mucosa, gastric  body, antrum, pyloric channel as well as a angularis and the fundus was  normal.   DUODENUM:  The bulb, second and third part of the duodenum was also normal.  The endoscope was withdrawn and the patient was prepared for a colonoscopy.   1. Total colonoscopy.   DESCRIPTION OF PROCEDURE:  Rectal examination performed, no abnormality  noted on external or digital exam. The scope was placed in the rectum and  advanced under direct vision to the sigmoid colon. He had an extensive  coating of mucosa of the sigmoid colon with black material felt to be iron.  He has been on it until recently. I attempted to wash and proceed further  but he had even more iron coating in the mid sigmoid colon. Therefore  examination could not be continued. There was a limited examination of the  mucosa of the sigmoid colon and rectum which was unremarkable. The endoscope  was withdrawn.  The patient tolerated the procedure well.   FINAL DIAGNOSES:  1. Small submucosal esophageal lesion consistent with leiomyoma. This was     incidental finding and left alone.  2. Normal exam of the stomach and duodenum.  3. Incomplete colonoscopy secondary to poor prep.   I do not believe that he could be prepped optimally for another attempt at  colonoscopy tomorrow. I am afraid it will have to be delayed for 10 days or  longer.   RECOMMENDATIONS:  His ferrous sulfate will be discontinued. H&H and retic  can be checked in the morning. He can resume his anticoagulation as  recommended by Dr. Dietrich Pates.   Will arranged for total colonoscopy at later date.                                               Lionel December, M.D.    NR/MEDQ  D:  02/02/2002  T:  02/08/2002  Job:  [id number]   cc:   Fredirick Maudlin, M.D.   Gerrit Friends. Dietrich Pates, M.D. [facility]  [address]  [place]  [place] [address]  Fax: 1

## 2010-11-21 NOTE — H&P (Signed)
[name], [name] NO.:  [id number]   MEDICAL RECORD NO.:  [id number]          PATIENT TYPE:  OBV   LOCATION:  A201                          FACILITY:  [facility]   PHYSICIAN:  Edward L. Juanetta Gosling, M.D.DATE OF BIRTH:  April 09, 1946   DATE OF ADMISSION:  03/13/2005  DATE OF DISCHARGE:  LH                                HISTORY & PHYSICAL   REASON FOR ADMISSION:  Shortness of breath.   HISTORY OF PRESENT ILLNESS:  Mr. Bartles is a 65 year old who has a long  known history of COPD, coronary artery occlusive disease, valvular  replacement and who has been anemic.  He was noted to be more anemic  recently when he saw Dr. Dietrich Pates.  He is scheduled to come to my office in  the next several days.  He did stop smoking about 1 month ago which is  excellent news.  When he came to the emergency room, he had been out mowing  his grass and became more short of breath.  When he came to the ER, he was  noted to have a hemoglobin level of 8.3 which is down from before.  He has  not noticed any blood in his stool.  He has had multiple bouts of anemia and  never has been found to have blood in his stool.  He has never had a GI  bleed as far as we can tell and his anemia appears to be related to valve  replacement and hemolysis from that.  He had a history of a coronary artery  bypass graft with aortic valve repair and mitral valve repair in 2003.  He  has history of hypertension with previous Class IV congestive heart failure,  better with his valve repairs and hyperlipidemia.  He says that he has been  feeling fairly well.  As mentioned, he had stopped smoking about 1 month  ago.  He has no other new complaints.  He does not have any chest pain, just  felt short of breath.  He did cough up some pink-colored sputum.  He was  more short of breath when he went to lie down.  He has not noticed any  edema.   PAST MEDICAL HISTORY:  1.  COPD.  2.  Mitral and aortic valve replacement.  3.   History of congestive heart failure.  4.  History of coronary artery disease.  5.  Hypertension.  6.  Hyperlipidemia.   MEDICATIONS:  1.  Multivitamin one daily.  2.  Folic acid 400 mcg daily.  3.  Omeprazole 40 mg daily.  4.  Lisinopril 30 mg daily.  5.  Metoprolol 50 mg daily.  6.  Zetia 10 mg daily.  7.  Aspirin 81 mg daily.  8.  Zocor 40 mg daily.  9.  Coumadin 5 mg as directed.  10. Wellbutrin 150 mg 2 tablets at bedtime.   FAMILY HISTORY:  No known history of any sort of coronary disease, at least  early.  There are several family members who have congestive heart failure.  There is a history of coronary artery  disease in the family.   SOCIAL HISTORY:  He lives in [place].  He is disabled because of his  cardiac disease.  He stopped smoking as mentioned about 1 month ago.  He  does not use alcohol.   REVIEW OF SYSTEMS:  Otherwise negative.   PHYSICAL EXAMINATION:  VITAL SIGNS:  Blood pressure 185/59, pulse 109,  respirations 22, O2 saturations 98% on 2 L.  HEENT:  Pupils equal round and reactive to light and accommodation.  His  nose and throat are clear.  He does have some moderate jugular venous  distention while he is flat.  Mucous membranes are moist.  CHEST:  Respiratory rates in the 20s, somewhat mildly labored.  HEART:  Prosthetic valve sounds.  ABDOMEN:  Soft without masses.  EXTREMITIES:  No edema.  Vein graft sites are seen.  Pulses are intact in  his feet.   LABORATORY DATA AND X-RAY FINDINGS:  He has had an EKG done in the emergency  room that shows sinus tachycardia with rate of about 100, left atrial  enlargement.   White blood count 6700, hemoglobin 8.3, platelets 332,000.  Blood gas with  pH 7.45, pCO2 33, pO2 62 on room air.  Prothrombin time was 30.4 with an INR  of 2.9.  His electrolytes are normal.  BUN 14, creatinine 0.9, liver  function normal.   PLAN:  Admit and go ahead and transfuse him.  I am not sure this is GI in  origin.  We are  going to get a haptoglobin check peripheral blood smear and  anemia profile.  He may able to be discharged later after he gets his blood.  He will need to be monitored.  Continue with his other medications.  Will  ask pharmacy to help with his Coumadin dosing and then follow.      Edward L. Juanetta Gosling, M.D.  Electronically Signed     ELH/MEDQ  D:  03/13/2005  T:  03/13/2005  Job:  [id number]

## 2010-11-21 NOTE — Group Therapy Note (Signed)
   NAME:  [name], [name]                          ACCOUNT NO.:  [id number]   MEDICAL RECORD NO.:  [id number]                   PATIENT TYPE:  INP   LOCATION:  A226                                 FACILITY:  [facility]   PHYSICIAN:  Fredirick Maudlin, M.D.              DATE OF BIRTH:  1945/12/10   DATE OF PROCEDURE:  DATE OF DISCHARGE:  02/03/2002                                   PROGRESS NOTE   PROBLEM:  Anemia, congestive heart failure, coronary artery occlusive  disease, status post valve replacement x 2.   SUBJECTIVE:  The patient says he feels well today.  He has no new  complaints.   PHYSICAL EXAMINATION:  CHEST:  The chest is much clearer.  HEART:  His heart is regular.  He does have prosthetic valve sounds.  ABDOMEN:  His abdomen is soft.  EXTREMITIES:  Without edema.  NEUROLOGIC:  CNS is grossly intact.   ASSESSMENT:  He has improved.   PLAN:  He is undergoing the switch to Heparin coming off of his Coumadin and  plans are for him to have probable EGD and colonoscopy tomorrow and then he  is going to be re-anticoagulated depending on results of his studies.  __________ after that.  Otherwise he will continue his current treatment.                                               Fredirick Maudlin, M.D.    ELH/MEDQ  D:  01/31/2002  T:  02/05/2002  Job:  [id number]   cc:   Fredirick Maudlin, M.D.

## 2010-11-21 NOTE — Discharge Summary (Signed)
[facility]  Patient:    [name], [name] Visit Number: [id number] MRN: [id number]          Service Type: MED Location: 2000 2033 01 Attending Physician:  Cleatrice Burke Dictated by:   Adair Patter, P.A. Admit Date:  11/02/2001 Discharge Date: 11/16/2001   CC:         [facility]   Discharge Summary  ADMITTING DIAGNOSES: 1. Coronary artery disease. 2. Aortic stenosis. 3. Mitral stenosis.  DISCHARGE DIAGNOSIS:  Coronary artery disease.  HOSPITAL COURSE:  Mr. Tugwell was admitted to [facility] on November 02, 2001, at which time he underwent cardiac catheterization which revealed the patient had significant coronary artery disease and valvular disease stated in the admitting diagnosis.  Because of this, Dr. Laneta Simmers was consulted and he performed on Nov 09, 2001, a coronary bypass graft x4 with a left internal mammary artery anastomosed to the left anterior descending, saphenous vein graft to the obtuse marginal artery, saphenous vein graft to the diagonal and saphenous vein graft to the distal right coronary artery.  At this time, the patient also underwent aortic valve replacement with a #21 St. Jude valve and mitral valve replacement with a #25 St. Jude valve.  No complications were noted during procedure.  Postoperatively, the patients hospital course was complicated by atrial fibrillation.  This was rate controlled with Toprol, digoxin and amiodarone.  The patient was also anticoagulated with Coumadin secondary to his replacements and atrial fibrillation.  He remained hemodynamically stable throughout his postoperative course.  Hemoglobin and hematocrit remained stable at 9.5 and 27.  BUN and creatinine were 18 and 1.0. He was subsequently deemed stable for discharge on Nov 16, 2001.  DISCHARGE MEDICATIONS: 1. Tylox one to two tablets every four to six hours as needed for pain. 2. Toprol XL 25 mg one daily. 3. Digoxin 0.25  mg one daily. 4. Amiodarone 200 mg one tablet every 12 hours. 5. Coumadin.  The patients final Coumadin dose will be dictated by his INR at    the time of discharge.  ACTIVITY:  The patient was told to avoid driving, strenuous activity or lifting heavy objects.  He was told to walk daily and continue breathing exercises.  DIET:  Low fat, low salt diet.  WOUND CARE:  The patient was told he could shower and clean his incision with soap and water.  DISPOSITION:  To home.  FOLLOWUP:  The patient was told to see his cardiologist, Dr. Tenny Craw on May 22, at 1:30 p.m.  He will have his INR drawn at the [facility] clinic on Friday, May 16.  He will see Dr. Laneta Simmers at the [facility] office on Tuesday, June 10, at 9:45 a.m. Dictated by:   Adair Patter, P.A. Attending Physician:  Cleatrice Burke DD:  11/15/01 TD:  11/17/01 Job: [id number]

## 2010-11-21 NOTE — Consult Note (Signed)
[facility]  Patient:    [name], [name] Visit Number: [id number] MRN: [id number]          Service Type: MED Location: 2A A226 01 Attending Physician:  Annamarie Dawley Dictated by:   Roetta Sessions, M.D. Admit Date:  01/28/2002   CC:         Kari Baars, M.D.   Consultation Report  REASON FOR CONSULTATION:  Anemia.  HISTORY OF PRESENT ILLNESS:  The patient is a pleasant 65 year old gentleman who presented to the emergency department yesterday with progressive dyspnea. He was found to have a hemoglobin of 7 with an MCV of 91.  He was Hemoccult negative in the emergency department.  He was admitted for further evaluation. He has received hemes of packed red blood cells overnight.  The patient has a history of undergoing an aortic valve and mitral valve replacement along with a four-vessel CABG back on Nov 05, 2001.  He apparently had a mitral valve stenosis and tight AS (gradient of 90 mmHg).  He also had multivessel coronary disease.  He has been anticoagulated ever since surgery. His pro time on admission is 18.9 with an INR of 2.3.  The patient relates small volume gross blood per rectum which he felt was temporarily related to constipation for a couple of weeks following discharge from his cardiac surgery in May.  Those symptoms have subsided.  He is having one bowel movement daily to every other day.  He is not having an bright red blood per rectum or melena.  He has no abdominal pain.  He lost about 15 pounds following surgery, but he has gained that back.  He also tells me that he had frequent reflux symptoms prior to surgery but, after surgery, those symptoms have subsided without any acid suppression therapy.  He denies odynophagia, dysphagia, early satiety, or reflux symptoms (currently), nausea or vomiting.  There has been no weight loss.  There is no family history for colorectal neoplasia although his sister apparently has had colonic  polyps removed.  He has never had his lower GI tract imaged.  He tells me Dr. Esmeralda Arthur told him 20 or so years ago that he had peptic ulcer disease (no diagnostic studies were performed).  PAST MEDICAL HISTORY:  History of coronary disease, history of valvular heart disease as outlined above status post cardiac surgery back in May of this year.  Other surgeries - status post bilateral inguinal herniorrhaphy.  MEDICATIONS ON ADMISSION:  Altace, Xanax, Zocor, Toprol, Coumadin.  ALLERGIES:  No known drug allergies.  FAMILY HISTORY:  Outlined above.  SOCIAL HISTORY:  Patient is married, has one child, lives in [place]. Smoked over a pack a day for 40 or so years.  Drinks two to three beers daily. Works for [employer].  REVIEW OF SYSTEMS:  No fever, chills.  No jaundice.  No clay-colored stools, dark-colored urine.  Recent dyspnea as outlined above.  PHYSICAL EXAMINATION:  GENERAL:  A pleasant, balding 65 year old gentleman.  VITAL SIGNS:  Blood pressure 178/82, pulse 71, respiratory rate 20.  SKIN:  Warm and dry.  There is no jaundice.  HEENT:  No scleral icterus.  Conjunctivae are pink.  Oral cavity - no lesions. JVD is not prominent.  CHEST/LUNGS:  Clear to auscultation.  CARDIAC:  Regular rate and rhythm with multiple prosthetic valve sounds, a well-healing median sternotomy site identified.  ABDOMEN:  Nondistended, positive bowel sounds, soft, nontender, without appreciable mass or organomegaly.  EXTREMITIES:  No edema.  ADDITIONAL LABORATORY DATA:  He has received two units of packed red blood cells since admission.  A follow-up H&H from December 29, 2001, 10.9, 31.7.  This morning, 11.8, 39.8.  On July 26, AST 49, ALT 15, ALP 24, total bilirubin 1.7. This morning, AST 73, ALT 17, ALP 144, total bilirubin 2.0,  IMPRESSION:  The patient is a pleasant 65 year old gentleman admitted to the hospital with progressive dyspnea.  He was found to be  significantly anemic with a hemoglobin of 7.9 and a normal MCV.  He has not had any clinical evidence of GI bleeding recently.  He was Hemoccult negative in the emergency department yesterday.  He does report small volume hematochezia ongoing a couple of months ago but none since that time.  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  Given the fact that he has never had a colonoscopy, he needs to have one done in the near future.  In addition, I am going to advocate going ahead and performing an upper gastrointestinal endoscopy at the same time.  He has a history of GERD, but symptoms have settled down since surgery, reported history of peptic ulcer disease.  However, I feel that diagnosis is questionable without the patient having any objective studies.  He does have an elevated SGOT compared to SGPT.  This could be alcohol effect or indirect effect of hemolysis (if it exists).  RECOMMENDATIONS: 1. An eventual EGD/colonoscopy ideally while he is here.  I feel that he will    need full heparin bridging while holding his Coumadin.  However, before we    go that route, we would like to hear from the cardiology service who will    be seeing him tomorrow. 2. He has been started on Protonix 40 mg daily. 3. Will go ahead and send off an LDH, serum haptoglobin, and reticulocyte    count. 4. Will recheck liver functions tomorrow morning.  I have discussed my findings and recommendations with Dr. Kari Baars via telephone today.  I would like to thank Dr. Juanetta Gosling for allowing me to see this nice gentleman today. Dictated by:   Roetta Sessions, M.D. Attending Physician:  Annamarie Dawley DD:  01/29/02 TD:  01/29/02 Job: [id number]

## 2010-11-21 NOTE — Group Therapy Note (Signed)
   NAME:  [name], [name] NO.:  [id number]   MEDICAL RECORD NO.:  [id number]                   PATIENT TYPE:  INP   LOCATION:  A226                                 FACILITY:  [facility]   PHYSICIAN:  Fredirick Maudlin, M.D.              DATE OF BIRTH:  28-Sep-1945   DATE OF PROCEDURE:  02/03/2002  DATE OF DISCHARGE:  02/03/2002                                   PROGRESS NOTE   HISTORY OF PRESENT ILLNESS:  Mr. Pangilinan says that he is feeling okay and has  no complaints. He denies any new problems. He is set for his endoscopy  today. He is still somewhat anticoagulating but Dr. Dionicia Abler says he thinks he  can go ahead. We are working on discharge plans after he has had his  endoscopy. He may be able to be discharged home on Lovenox and Coumadin. We  will try to work all this out after he has had his endoscopy.   PHYSICAL EXAMINATION:  His exam today showed that his chest is pretty clear.  His heart is regular with a prosthetic valve sound.  ABDOMEN: Soft.                                               Fredirick Maudlin, M.D.    ELH/MEDQ  D:  02/02/2002  T:  02/08/2002  Job:  [id number]

## 2010-11-21 NOTE — Group Therapy Note (Signed)
   NAMEDJIBRIL, GLOGOWSKI NO.:  [id number]   MEDICAL RECORD NO.:  [id number]                   PATIENT TYPE:   LOCATION:                                       FACILITY:   PHYSICIAN:  Fredirick Maudlin, M.D.              DATE OF BIRTH:   DATE OF PROCEDURE:  DATE OF DISCHARGE:                                   PROGRESS NOTE   PROBLEM:  Anemia, status post valve replacement, status post coronary artery  bypass grafting, anticoagulation.   SUBJECTIVE:  The patient says he is feeling pretty well.  __________  He has  no new complaints.  He did have some heart failure, but that seems to have  resolved.   OBJECTIVE:  His hemoglobin this morning is 7.6, which is down from 11.0  yesterday.  His chest is pretty clear.  His heart is regular with prosthetic  valve sound.  He does not have any edema.  __________   ASSESSMENT:  His heart rate is controlled.  His heart failure is resolved.  He does have anemia, but __________ appreciated.  At this point, it is not  clear if it is anemia of iron deficiency or if it might be some other  problem such as __________.                                               Fredirick Maudlin, M.D.    ELH/MEDQ  D:  01/30/2002  T:  02/06/2002  Job:  [id number]

## 2010-11-21 NOTE — Consult Note (Signed)
[facility]  Patient:    [name], [name] Visit Number: [id number] MRN: [id number]          Service Type: MED Location: 2300 2399 03 Attending Physician:  Veneda Melter Dictated by:   Cindra Eves, D.D.S. Proc. Date: 11/08/01 Admit Date:  11/02/2001   CC:         Alleen Borne, M.D.   Consultation Report  DATE OF BIRTH:  [birth year]  REQUESTING PHYSICIAN:  Alleen Borne, M.D.  INTRODUCTION:  The patient is a 65 year old white male referred by Dr. Evelene Croon for a dental consultation. The patient with a severe aortic stenosis, moderate to severe mitral valve disease, and two-vessel coronary artery disease. The patient with anticipated coronary artery bypass graft and heart valve surgeries with Dr. Laneta Simmers in the future. The patient is now seen as part of a pre-heart valve surgery dental protocol to rule out dental infection prior to the anticipated heart valve surgery.  MEDICAL HISTORY: 1. Severe aortic stenosis with anticipated aortic valve replacement surgery. 2. Moderate to severe mitral stenosis with anticipated mitral valve    replacement. 3. Coronary artery disease with anticipated two-vessel coronary artery bypass    graft with Dr. Evelene Croon in the near future. 4. Class IV congestive heart failure. 5. Hypertension. 6. Long-standing tobacco abuse. 7. Status post bilateral inguinal hernia repair several years ago.  ALLERGIES/ADVERSE DRUG REACTIONS:  None known.  MEDICATIONS: 1. Enteric-coated aspirin 325 mg daily. 2. Lovenox 40 mg subcutaneously daily. 3. Pepcid 20 mg daily. 4. Furosemide 20 mg daily. 5. Normodyne 300 mg twice daily.  SOCIAL HISTORY:  The patient is married and lives with his [place] in [place] The patient has one daughter. 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 The patient with a long-standing history of heavy tobacco abuse of  approximately one pack per day x40+ years. The patient denies a history of excessive alcohol use.  FAMILY HISTORY:  The patient with a history of several family members having a history of congestive heart failure and coronary artery disease.  FUNCTIONAL ASSESSMENT:  The patient was independent for ADLs prior to this admission.  REVIEW OF SYSTEMS:  This is reviewed with the chart and the health history assessment form--this admission.  DENTAL HISTORY:  CHIEF COMPLAINT:  Dental consultation was requested for evaluation of dental condition prior to anticipated heart valve surgery.  HISTORY OF PRESENT ILLNESS:  The patient with recent identification of two-vessel coronary artery disease, severe aortic stenosis, and moderate to severe mitral valve stenosis. The patient with anticipated aortic valve replacement, mitral valve replacement, and coronary artery bypass graft with Dr. Evelene Croon in the future. The patient is now seen as part of a pre-heart valve surgery dental protocol to rule out dental infection prior to the anticipated heart valve surgery.  The patient currently denies having any teeth. The patient denies any soft tissue pathology, denture ulcerations, or reason to have a dental infection. The patient indicates that he had his upper and lower complete dentures made approximately 1994. The patient indicates that they "fit good." The patient does use some denture adhesive to assist in retention of the dentures. The patient currently denies any sore spots or denture ulcerations associated with the dentures at this time. The patient indicates that he is able to chew with these dentures appropriately. The patient has not seen a dentist for a relatively long time and cannot remember the name of the  dentist that he recently went to.  DENTAL EXAMINATION:  GENERAL:  The patient is a well-developed, well-nourished white male in no acute distress.  HEAD AND NECK:  There is no  palpable lymphadenopathy. There are no acute TMJ symptoms.  INTRAORAL:  The patient is edentulous. The patient does have atrophy of the existing maxillary and mandibular alveolar ridges. The patient has no obvious denture ulcerations or denture irritations.  DENTITION:  The patient is missing all his teeth. There are no obvious retained root tips.  PROSTHODONTICS:  The patient with upper and lower complete dentures which were fabricated approximately in 1994. The patient indicates that they "fit good." The dentures appear to be clinically acceptable with good retention and stability. The patient appears to have less than ideal denture occlusion with the minimal exam allowed by the patient at this time. The patient could seek evaluation by the private dentist of his choice in the future.  OCCLUSION:  The occlusion is as above.  RADIOGRAPHIC INTERPRETATIONS:  A panoramic x-ray was taken at the department of radiology on Nov 08, 2001.  There are multiple missing teeth #1 through #32. There are no obvious retained root tips or impacted teeth. This is a suboptimal panoramic x-ray due to possible patient movement. There is some atrophy of the existing maxillary and mandibular alveolar ridges noted.  ASSESSMENT: 1. The patient is edentulous. 2. The patient has some atrophy of the existing maxillary and mandibular    alveolar ridges. 3. The patient with upper and lower complete dentures which are clinically    acceptable at this time. 4. No obvious evidence of denture irritation or ulcerations. 5. No obvious soft tissue pathology noted. 6. Less than ideal occlusion of the upper and lower complete denture. This    could be evaluated by the private dentist of his choice in the future once    medically stable from the heart valve surgery.  PLAN/RECOMMENDATIONS: 1. I discussed the risks, benefits, and complications of various treatment     options with the patient in relationship to his  medical and dental    conditions, anticipated heart valve surgeries along with the coronary    artery bypass graft and risks for subacute bacterial endocarditis due to    denture irritations. The patient currently is cleared for heart valve    surgery and can reconsult dental medicine as indicated. The patient is    aware that he has less than ideal occlusion of the dentures and will follow    up with the private dentist of his choice if he so desires. 2. Discussion of findings with Dr. Evelene Croon to provide dental clearance    for the heart valve surgery. Dr. Laneta Simmers to reconsult dental medicine if    acute problems arise during this admission. Dictated by:   Cindra Eves, D.D.S. Attending Physician:  Veneda Melter DD:  11/08/01 TD:  11/09/01 Job: [id number]

## 2010-12-22 ENCOUNTER — Ambulatory Visit (INDEPENDENT_AMBULATORY_CARE_PROVIDER_SITE_OTHER): Payer: Medicare Other | Admitting: *Deleted

## 2010-12-22 DIAGNOSIS — Z7901 Long term (current) use of anticoagulants: Secondary | ICD-10-CM | POA: Insufficient documentation

## 2010-12-22 DIAGNOSIS — I359 Nonrheumatic aortic valve disorder, unspecified: Secondary | ICD-10-CM

## 2010-12-22 DIAGNOSIS — Z954 Presence of other heart-valve replacement: Secondary | ICD-10-CM

## 2010-12-22 DIAGNOSIS — Z9889 Other specified postprocedural states: Secondary | ICD-10-CM

## 2010-12-22 DIAGNOSIS — I059 Rheumatic mitral valve disease, unspecified: Secondary | ICD-10-CM

## 2011-01-14 ENCOUNTER — Encounter (HOSPITAL_COMMUNITY)
Admission: RE | Admit: 2011-01-14 | Discharge: 2011-01-14 | Disposition: A | Discharge status: Home / Self Care | Payer: Medicare Other | Source: Ambulatory Visit | Attending: Ophthalmology | Admitting: Ophthalmology

## 2011-01-14 ENCOUNTER — Encounter (HOSPITAL_COMMUNITY): Payer: Self-pay

## 2011-01-14 ENCOUNTER — Other Ambulatory Visit: Payer: Self-pay

## 2011-01-14 DIAGNOSIS — Z954 Presence of other heart-valve replacement: Secondary | ICD-10-CM

## 2011-01-14 DIAGNOSIS — Z9889 Other specified postprocedural states: Secondary | ICD-10-CM

## 2011-01-14 HISTORY — DX: Gastro-esophageal reflux disease without esophagitis: K21.9

## 2011-01-14 HISTORY — DX: Hyperlipidemia, unspecified: E78.5

## 2011-01-14 LAB — BASIC METABOLIC PANEL
BUN: 10 mg/dL (ref 6–23)
Calcium: 9.3 mg/dL (ref 8.4–10.5)
GFR calc non Af Amer: >60 mL/min (ref >60)
Glucose, Bld: 106 mg/dL — ABNORMAL HIGH (ref 70–99)
Sodium: 132 mEq/L — ABNORMAL LOW (ref 135–145)

## 2011-01-14 LAB — CBC
MCH: 30.8 pg (ref 26.0–34.0)
MCHC: 34.5 g/dL (ref 30.0–36.0)
Platelets: 282 10*3/uL (ref 150–400)

## 2011-01-14 NOTE — Patient Instructions (Addendum)
20 [name]  01/14/2011   Your procedure is scheduled on:  Tuesday, 01/20/11  Report to [facility] at [place] AM.  Call this number if you have problems the morning of surgery: [phone]   Remember:   Do not eat food:After Midnight.  Do not drink clear liquids: After Midnight.  Take these medicines the morning of surgery with A SIP OF WATER: Amlodipine, clonidine, lisinopril, metoprolol, and omeprazole.   Do not wear jewelry, make-up or nail polish.  Do not bring valuables to the hospital.  Contacts, dentures or bridgework may not be worn into surgery.  Leave suitcase in the car. After surgery it may be brought to your room.  For patients admitted to the hospital, checkout time is 11:00 AM the day of discharge.   Patients discharged the day of surgery will not be allowed to drive home.  Name and phone number of your driver: Leonette Most, brother  Special Instructions: N/A   Please read over the following fact sheets that you were given: Pain Booklet and Anesthesia Post-op Instructions   PATIENT INSTRUCTIONS POST-ANESTHESIA  IMMEDIATELY FOLLOWING SURGERY:  Do not drive or operate machinery for the first twenty four hours after surgery.  Do not make any important decisions for twenty four hours after surgery or while taking narcotic pain medications or sedatives.  If you develop intractable nausea and vomiting or a severe headache please notify your doctor immediately.  FOLLOW-UP:  Please make an appointment with your surgeon as instructed. You do not need to follow up with anesthesia unless specifically instructed to do so.  WOUND CARE INSTRUCTIONS (if applicable):  Keep a dry clean dressing on the anesthesia/puncture wound site if there is drainage.  Once the wound has quit draining you may leave it open to air.  Generally you should leave the bandage intact for twenty four hours unless there is drainage.  If the epidural site drains for more than 36-48 hours please call the anesthesia  department.  QUESTIONS?:  Please feel free to call your physician or the hospital operator if you have any questions, and they will be happy to assist you.     [facility] Anesthesia Department [address] [place] [phone]

## 2011-01-19 ENCOUNTER — Ambulatory Visit (INDEPENDENT_AMBULATORY_CARE_PROVIDER_SITE_OTHER): Payer: Medicare Other | Admitting: *Deleted

## 2011-01-19 DIAGNOSIS — Z9889 Other specified postprocedural states: Secondary | ICD-10-CM

## 2011-01-19 DIAGNOSIS — I359 Nonrheumatic aortic valve disorder, unspecified: Secondary | ICD-10-CM

## 2011-01-19 DIAGNOSIS — Z954 Presence of other heart-valve replacement: Secondary | ICD-10-CM

## 2011-01-19 DIAGNOSIS — I059 Rheumatic mitral valve disease, unspecified: Secondary | ICD-10-CM

## 2011-01-19 LAB — POCT INR: INR: 4.5

## 2011-01-20 ENCOUNTER — Ambulatory Visit (HOSPITAL_COMMUNITY): Payer: Medicare Other | Admitting: Anesthesiology

## 2011-01-20 ENCOUNTER — Encounter (HOSPITAL_COMMUNITY)
Admission: RE | Disposition: A | Discharge status: Home / Self Care | Payer: Self-pay | Source: Ambulatory Visit | Attending: Ophthalmology

## 2011-01-20 ENCOUNTER — Encounter (HOSPITAL_COMMUNITY): Payer: Self-pay | Admitting: *Deleted

## 2011-01-20 ENCOUNTER — Encounter (HOSPITAL_COMMUNITY): Payer: Self-pay | Admitting: Ophthalmology

## 2011-01-20 ENCOUNTER — Encounter (HOSPITAL_COMMUNITY): Payer: Self-pay | Admitting: Anesthesiology

## 2011-01-20 ENCOUNTER — Ambulatory Visit (HOSPITAL_COMMUNITY)
Admission: RE | Admit: 2011-01-20 | Discharge: 2011-01-20 | Disposition: A | Discharge status: Home / Self Care | Payer: Medicare Other | Source: Ambulatory Visit | Attending: Ophthalmology | Admitting: Ophthalmology

## 2011-01-20 DIAGNOSIS — H2510 Age-related nuclear cataract, unspecified eye: Secondary | ICD-10-CM | POA: Insufficient documentation

## 2011-01-20 DIAGNOSIS — J449 Chronic obstructive pulmonary disease, unspecified: Secondary | ICD-10-CM | POA: Insufficient documentation

## 2011-01-20 DIAGNOSIS — J4489 Other specified chronic obstructive pulmonary disease: Secondary | ICD-10-CM | POA: Insufficient documentation

## 2011-01-20 DIAGNOSIS — Z01810 Encounter for preprocedural cardiovascular examination: Secondary | ICD-10-CM | POA: Insufficient documentation

## 2011-01-20 DIAGNOSIS — I10 Essential (primary) hypertension: Secondary | ICD-10-CM | POA: Insufficient documentation

## 2011-01-20 DIAGNOSIS — Z01812 Encounter for preprocedural laboratory examination: Secondary | ICD-10-CM | POA: Insufficient documentation

## 2011-01-20 DIAGNOSIS — E785 Hyperlipidemia, unspecified: Secondary | ICD-10-CM | POA: Insufficient documentation

## 2011-01-20 DIAGNOSIS — Z79899 Other long term (current) drug therapy: Secondary | ICD-10-CM | POA: Insufficient documentation

## 2011-01-20 DIAGNOSIS — Z954 Presence of other heart-valve replacement: Secondary | ICD-10-CM | POA: Insufficient documentation

## 2011-01-20 HISTORY — PX: CATARACT EXTRACTION W/PHACO: SHX586

## 2011-01-20 SURGERY — PHACOEMULSIFICATION, CATARACT, WITH IOL INSERTION
Anesthesia: Monitor Anesthesia Care | Site: Eye | Laterality: Right | Wound class: Clean

## 2011-01-20 MED ORDER — FLURBIPROFEN SODIUM 0.03 % OP SOLN
OPHTHALMIC | Status: AC
  Filled 2011-01-20: qty 2.5

## 2011-01-20 MED ORDER — LIDOCAINE HCL 3.5 % OP GEL
OPHTHALMIC | Status: DC | PRN
  Administered 2011-01-20: 1 via OPHTHALMIC

## 2011-01-20 MED ORDER — LACTATED RINGERS IV SOLN
INTRAVENOUS | Status: DC | PRN
  Administered 2011-01-20: 07:00:00 via INTRAVENOUS

## 2011-01-20 MED ORDER — CYCLOPENTOLATE-PHENYLEPHRINE 0.2-1 % OP SOLN
OPHTHALMIC | Status: AC
  Administered 2011-01-20: 1 [drp] via OPHTHALMIC
  Filled 2011-01-20: qty 2

## 2011-01-20 MED ORDER — LIDOCAINE HCL (PF) 1 % IJ SOLN
INTRAMUSCULAR | Status: DC | PRN
  Administered 2011-01-20: .5 mL

## 2011-01-20 MED ORDER — BSS IO SOLN
INTRAOCULAR | Status: DC | PRN
  Administered 2011-01-20: 15 mL via OPHTHALMIC

## 2011-01-20 MED ORDER — PROVISC 10 MG/ML IO SOLN
INTRAOCULAR | Status: DC | PRN
  Administered 2011-01-20: .85 mL via OPHTHALMIC

## 2011-01-20 MED ORDER — MIDAZOLAM HCL 2 MG/2ML IJ SOLN
INTRAMUSCULAR | Status: AC
  Administered 2011-01-20: 2 mg via INTRAVENOUS
  Filled 2011-01-20: qty 2

## 2011-01-20 MED ORDER — LIDOCAINE HCL (PF) 1 % IJ SOLN
INTRAMUSCULAR | Status: AC
  Filled 2011-01-20: qty 2

## 2011-01-20 MED ORDER — KETOROLAC TROMETHAMINE 0.5 % OP SOLN
1.0000 [drp] | OPHTHALMIC | Status: AC
  Administered 2011-01-20 (×2): 1 [drp] via OPHTHALMIC

## 2011-01-20 MED ORDER — TETRACAINE HCL 0.5 % OP SOLN
1.0000 [drp] | OPHTHALMIC | Status: AC
  Administered 2011-01-20 (×3): 1 [drp] via OPHTHALMIC

## 2011-01-20 MED ORDER — PHENYLEPHRINE HCL 2.5 % OP SOLN
OPHTHALMIC | Status: AC
  Administered 2011-01-20: 1 [drp] via OPHTHALMIC
  Filled 2011-01-20: qty 2

## 2011-01-20 MED ORDER — EPINEPHRINE HCL 1 MG/ML IJ SOLN
INTRAOCULAR | Status: DC | PRN
  Administered 2011-01-20: 08:00:00

## 2011-01-20 MED ORDER — MIDAZOLAM HCL 2 MG/2ML IJ SOLN
1.0000 mg | INTRAMUSCULAR | Status: DC | PRN
  Administered 2011-01-20: 2 mg via INTRAVENOUS

## 2011-01-20 MED ORDER — PHENYLEPHRINE HCL 2.5 % OP SOLN
1.0000 [drp] | OPHTHALMIC | Status: AC
  Administered 2011-01-20 (×3): 1 [drp] via OPHTHALMIC

## 2011-01-20 MED ORDER — EPINEPHRINE HCL 1 MG/ML IJ SOLN
INTRAMUSCULAR | Status: AC
  Filled 2011-01-20: qty 1

## 2011-01-20 MED ORDER — TETRACAINE HCL 0.5 % OP SOLN
OPHTHALMIC | Status: AC
  Administered 2011-01-20: 1 [drp] via OPHTHALMIC
  Filled 2011-01-20: qty 2

## 2011-01-20 MED ORDER — CARBACHOL 0.01 % IO SOLN
INTRAOCULAR | Status: AC
  Filled 2011-01-20: qty 1.5

## 2011-01-20 MED ORDER — LACTATED RINGERS IV SOLN
INTRAVENOUS | Status: DC
  Administered 2011-01-20: 06:00:00 via INTRAVENOUS
  Filled 2011-01-20: qty 500

## 2011-01-20 MED ORDER — CYCLOPENTOLATE-PHENYLEPHRINE 0.2-1 % OP SOLN
1.0000 [drp] | OPHTHALMIC | Status: AC
Start: 2011-01-20 — End: 2011-01-20
  Administered 2011-01-20 (×3): 1 [drp] via OPHTHALMIC

## 2011-01-20 SURGICAL SUPPLY — 25 items
CAPSULAR TENSION RING-AMO (OPHTHALMIC RELATED) IMPLANT
CLOTH BEACON ORANGE TIMEOUT ST (SAFETY) ×1 IMPLANT
DUOVISC SYSTEM (INTRAOCULAR LENS)
EYE SHIELD UNIVERSAL CLEAR (GAUZE/BANDAGES/DRESSINGS) ×1 IMPLANT
GLOVE BIOGEL M 6.5 STRL (GLOVE) IMPLANT
GLOVE ECLIPSE 6.5 STRL STRAW (GLOVE) IMPLANT
GLOVE ECLIPSE 7.0 STRL STRAW (GLOVE) IMPLANT
GLOVE EXAM NITRILE LRG STRL (GLOVE) ×1 IMPLANT
GLOVE EXAM NITRILE MD LF STRL (GLOVE) IMPLANT
GLOVE EXAM NITRILE PF LG BLUE (GLOVE) IMPLANT
GLOVE INDICATOR 6.5 STRL GRN (GLOVE) ×1 IMPLANT
GLOVE SKINSENSE NS SZ6.5 (GLOVE)
GLOVE SKINSENSE STRL SZ6.5 (GLOVE) IMPLANT
HEALON 5 0.6 ML (INTRAOCULAR LENS) IMPLANT
KIT VITRECTOMY (OPHTHALMIC RELATED) IMPLANT
PAD ARMBOARD 7.5X6 YLW CONV (MISCELLANEOUS) ×1 IMPLANT
PROC W NO LENS (INTRAOCULAR LENS)
PROC W SPEC LENS (INTRAOCULAR LENS)
PROCESS W NO LENS (INTRAOCULAR LENS) IMPLANT
PROCESS W SPEC LENS (INTRAOCULAR LENS) IMPLANT
RING MALYGIN (MISCELLANEOUS) IMPLANT
SIGHTPATH CAT PROC W REG LENS (Ophthalmic Related) ×2 IMPLANT
SYSTEM DUOVISC (INTRAOCULAR LENS) IMPLANT
VISCOELASTIC ADDITIONAL (OPHTHALMIC RELATED) IMPLANT
WATER STERILE IRR 250ML POUR (IV SOLUTION) ×1 IMPLANT

## 2011-01-20 NOTE — Anesthesia Preprocedure Evaluation (Addendum)
Anesthesia Evaluation  Name, MR# and DOB Patient awake  General Assessment Comment  History of Anesthesia Complications Negative for: history of anesthetic complications  Airway Mallampati: II  Neck ROM: Full    Dental  (+) Edentulous Upper and Edentulous Lower   Pulmonary  COPD  + decreased breath sounds    Cardiovascular hypertension, + CAD, + CABG and + DOE Valvular problems/murmurs: s/p AVR  MVR. Regular Normal   Neuro/Psych  GI/Hepatic/Renal (+) PUD,  GERD Medicated and Controlled     Endo/Other   Abdominal   Musculoskeletal  Hematology   Peds  Reproductive/Obstetrics   Anesthesia Other Findings             Anesthesia Physical Anesthesia Plan  ASA: III  Anesthesia Plan: MAC   Post-op Pain Management:    Induction:   Airway Management Planned: Nasal Cannula  Additional Equipment:   Intra-op Plan:   Post-operative Plan:   Informed Consent: I have reviewed the patients History and Physical, chart, labs and discussed the procedure including the risks, benefits and alternatives for the proposed anesthesia with the patient or authorized representative who has indicated his/her understanding and acceptance.     Plan Discussed with:   Anesthesia Plan Comments:         Anesthesia Quick Evaluation

## 2011-01-20 NOTE — Anesthesia Postprocedure Evaluation (Signed)
  Anesthesia Post-op Note  Patient: [name].  Procedure(s) Performed:  CATARACT EXTRACTION PHACO AND INTRAOCULAR LENS PLACEMENT (IOC)  Patient Location: PACU  Anesthesia Type: MAC  Level of Consciousness: awake, alert  and oriented  Airway and Oxygen Therapy: Patient Spontanous Breathing  Post-op Pain: none  Post-op Assessment: Post-op Vital signs reviewed, Patient's Cardiovascular Status Stable and Respiratory Function Stable  Post-op Vital Signs: Reviewed and stable  Complications: No apparent anesthesia complications

## 2011-01-20 NOTE — Transfer of Care (Signed)
Immediate Anesthesia Transfer of Care Note  Patient: [name].  Procedure(s) Performed:  CATARACT EXTRACTION PHACO AND INTRAOCULAR LENS PLACEMENT (IOC)  Patient Location: PACU and Short Stay  Anesthesia Type: MAC  Level of Consciousness: awake, alert  and oriented  Airway & Oxygen Therapy: Patient Spontanous Breathing  Post-op Assessment: Report given to PACU RN  Post vital signs: Reviewed and stable  Complications: No apparent anesthesia complications

## 2011-01-20 NOTE — H&P (Signed)
  H and P is handwritten.  No change in H and P.

## 2011-01-20 NOTE — Op Note (Signed)
Patient brought to the operating room and prepped and draped in the usual manner.  Lid speculum inserted in right eye.  Stab incision made at the twelve o'clock position.  Provisc instilled in the anterior chamber.   A 2.4 mm. Stab incision was made temporally.  An anterior capsulotomy was done with a bent 25 gauge needle.  The nucleus was hydrodissected.  The Phaco tip was inserted in the anterior chamber and the nucleus was emulsified.  CDE was 8.51.  The cortical material was then removed with the I and A tip.  Posterior capsule was the polished.  The anterior chamber was deepened with Provisc.  A 17.5 Diopter Rayner 570C IOL was then inserted in the capsular bag.  Provisc was then removed with the I and A tip.  The wound was then hydrated.  Patient sent to the Recovery Room in good condition with follow up in my office.  

## 2011-01-20 NOTE — Op Note (Deleted)
Patient brought to the operating room and prepped and draped in the usual manner.  Lid speculum inserted in right eye.  Stab incision made at the twelve o'clock position.  Provisc instilled in the anterior chamber.   A 2.4 mm. Stab incision was made temporally.  An anterior capsulotomy was done with a bent 25 gauge needle.  The nucleus was hydrodissected.  The Phaco tip was inserted in the anterior chamber and the nucleus was emulsified.  CDE was 8.51.  The cortical material was then removed with the I and A tip.  Posterior capsule was the polished.  The anterior chamber was deepened with Provisc.  A 17.5 Diopter Rayner 570C IOL was then inserted in the capsular bag.  Provisc was then removed with the I and A tip.  The wound was then hydrated.  Patient sent to the Recovery Room in good condition with follow up in my office.

## 2011-01-30 ENCOUNTER — Ambulatory Visit (HOSPITAL_COMMUNITY)
Admission: RE | Admit: 2011-01-30 | Discharge: 2011-01-30 | Payer: Medicare Other | Source: Ambulatory Visit | Attending: Ophthalmology | Admitting: Ophthalmology

## 2011-01-30 ENCOUNTER — Encounter (HOSPITAL_COMMUNITY): Payer: Self-pay | Admitting: *Deleted

## 2011-01-30 NOTE — Patient Instructions (Signed)
20 [name].  01/30/2011   Your procedure is scheduled on:  02/03/2011  Report to [facility] at 0730 AM.  Call this number if you have problems the morning of surgery: [phone]   Remember:   Do not eat food:After Midnight.  Do not drink clear liquids: After Midnight.  Take these medicines the morning of surgery with A SIP OF WATER: Norvasc, Catapress, lisinopril, Lopressor, prilosec   Do not wear jewelry, make-up or nail polish.  Do not bring valuables to the hospital.  Contacts, dentures or bridgework may not be worn into surgery.  Leave suitcase in the car. After surgery it may be brought to your room.  For patients admitted to the hospital, checkout time is 11:00 AM the day of discharge.   Patients discharged the day of surgery will not be allowed to drive home.  Name and phone number of your driver: driver  Special Instructions: Use eye drops as prescibed   Please read over the following fact sheets that you were given: Pain Booklet and Anesthesia Post-op Instructions PATIENT INSTRUCTIONS POST-ANESTHESIA  IMMEDIATELY FOLLOWING SURGERY:  Do not drive or operate machinery for the first twenty four hours after surgery.  Do not make any important decisions for twenty four hours after surgery or while taking narcotic pain medications or sedatives.  If you develop intractable nausea and vomiting or a severe headache please notify your doctor immediately.  FOLLOW-UP:  Please make an appointment with your surgeon as instructed. You do not need to follow up with anesthesia unless specifically instructed to do so.  WOUND CARE INSTRUCTIONS (if applicable):  Keep a dry clean dressing on the anesthesia/puncture wound site if there is drainage.  Once the wound has quit draining you may leave it open to air.  Generally you should leave the bandage intact for twenty four hours unless there is drainage.  If the epidural site drains for more than 36-48 hours please call the anesthesia  department.  QUESTIONS?:  Please feel free to call your physician or the hospital operator if you have any questions, and they will be happy to assist you.     [facility] Anesthesia Department [address] [place] [phone]

## 2011-02-03 ENCOUNTER — Ambulatory Visit (HOSPITAL_COMMUNITY)
Admission: RE | Admit: 2011-02-03 | Discharge: 2011-02-03 | Disposition: A | Discharge status: Home / Self Care | Payer: Medicare Other | Source: Ambulatory Visit | Attending: Ophthalmology | Admitting: Ophthalmology

## 2011-02-03 ENCOUNTER — Ambulatory Visit (HOSPITAL_COMMUNITY): Payer: Medicare Other | Admitting: Anesthesiology

## 2011-02-03 ENCOUNTER — Encounter (HOSPITAL_COMMUNITY): Payer: Self-pay | Admitting: Anesthesiology

## 2011-02-03 ENCOUNTER — Encounter (HOSPITAL_COMMUNITY)
Admission: RE | Disposition: A | Discharge status: Home / Self Care | Payer: Self-pay | Source: Ambulatory Visit | Attending: Ophthalmology

## 2011-02-03 DIAGNOSIS — Z7982 Long term (current) use of aspirin: Secondary | ICD-10-CM | POA: Insufficient documentation

## 2011-02-03 DIAGNOSIS — I10 Essential (primary) hypertension: Secondary | ICD-10-CM | POA: Insufficient documentation

## 2011-02-03 DIAGNOSIS — H2510 Age-related nuclear cataract, unspecified eye: Secondary | ICD-10-CM | POA: Insufficient documentation

## 2011-02-03 DIAGNOSIS — Z79899 Other long term (current) drug therapy: Secondary | ICD-10-CM | POA: Insufficient documentation

## 2011-02-03 HISTORY — PX: CATARACT EXTRACTION W/PHACO: SHX586

## 2011-02-03 SURGERY — PHACOEMULSIFICATION, CATARACT, WITH IOL INSERTION
Anesthesia: Monitor Anesthesia Care | Site: Eye | Laterality: Left | Wound class: Clean

## 2011-02-03 MED ORDER — CYCLOPENTOLATE-PHENYLEPHRINE 0.2-1 % OP SOLN
1.0000 [drp] | OPHTHALMIC | Status: AC
  Administered 2011-02-03: 1 [drp] via OPHTHALMIC
  Administered 2011-02-03: 08:00:00 via OPHTHALMIC

## 2011-02-03 MED ORDER — EPINEPHRINE HCL 1 MG/ML IJ SOLN
INTRAMUSCULAR | Status: AC
  Filled 2011-02-03: qty 1

## 2011-02-03 MED ORDER — BSS IO SOLN
INTRAOCULAR | Status: DC | PRN
  Administered 2011-02-03: 15 mL via OPHTHALMIC

## 2011-02-03 MED ORDER — FLURBIPROFEN SODIUM 0.03 % OP SOLN: 1.0000 [drp] | Freq: Once | OPHTHALMIC | Status: DC

## 2011-02-03 MED ORDER — PHENYLEPHRINE HCL 2.5 % OP SOLN
1.0000 [drp] | Freq: Once | OPHTHALMIC | Status: AC
  Administered 2011-02-03: 1 [drp] via OPHTHALMIC

## 2011-02-03 MED ORDER — FLURBIPROFEN SODIUM 0.03 % OP SOLN
OPHTHALMIC | Status: AC
  Administered 2011-02-03: 08:00:00 via OPHTHALMIC
  Filled 2011-02-03: qty 2.5

## 2011-02-03 MED ORDER — PROVISC 10 MG/ML IO SOLN
INTRAOCULAR | Status: DC | PRN
  Administered 2011-02-03: 8.5 mg via OPHTHALMIC

## 2011-02-03 MED ORDER — MIDAZOLAM HCL 2 MG/2ML IJ SOLN
1.0000 mg | INTRAMUSCULAR | Status: DC | PRN
  Administered 2011-02-03: 2 mg via INTRAVENOUS

## 2011-02-03 MED ORDER — EPINEPHRINE HCL 1 MG/ML IJ SOLN
INTRAOCULAR | Status: DC | PRN
  Administered 2011-02-03: 10:00:00

## 2011-02-03 MED ORDER — CYCLOPENTOLATE-PHENYLEPHRINE 0.2-1 % OP SOLN
OPHTHALMIC | Status: AC
  Filled 2011-02-03: qty 2

## 2011-02-03 MED ORDER — TETRACAINE HCL 0.5 % OP SOLN
1.0000 [drp] | Freq: Once | OPHTHALMIC | Status: AC
  Administered 2011-02-03: 1 [drp] via OPHTHALMIC

## 2011-02-03 MED ORDER — TETRACAINE HCL 0.5 % OP SOLN: 1.0000 [drp] | Freq: Once | OPHTHALMIC | Status: DC

## 2011-02-03 MED ORDER — FLURBIPROFEN SODIUM 0.03 % OP SOLN
1.0000 [drp] | Freq: Once | OPHTHALMIC | Status: AC
  Administered 2011-02-03: 1 [drp] via OPHTHALMIC

## 2011-02-03 MED ORDER — PHENYLEPHRINE HCL 2.5 % OP SOLN: 1.0000 [drp] | Freq: Once | OPHTHALMIC | Status: DC

## 2011-02-03 MED ORDER — LACTATED RINGERS IV SOLN
INTRAVENOUS | Status: DC | PRN
  Administered 2011-02-03: 09:00:00 via INTRAVENOUS

## 2011-02-03 MED ORDER — MIDAZOLAM HCL 2 MG/2ML IJ SOLN
INTRAMUSCULAR | Status: AC
  Administered 2011-02-03: 2 mg via INTRAVENOUS
  Filled 2011-02-03: qty 2

## 2011-02-03 MED ORDER — TETRACAINE HCL 0.5 % OP SOLN
OPHTHALMIC | Status: AC
  Administered 2011-02-03: 08:00:00
  Filled 2011-02-03: qty 2

## 2011-02-03 MED ORDER — PHENYLEPHRINE HCL 2.5 % OP SOLN
OPHTHALMIC | Status: AC
  Administered 2011-02-03: 08:00:00 via OPHTHALMIC
  Filled 2011-02-03: qty 2

## 2011-02-03 SURGICAL SUPPLY — 23 items
CAPSULAR TENSION RING-AMO (OPHTHALMIC RELATED) IMPLANT
CLOTH BEACON ORANGE TIMEOUT ST (SAFETY) IMPLANT
DUOVISC SYSTEM (INTRAOCULAR LENS)
EYE SHIELD UNIVERSAL CLEAR (GAUZE/BANDAGES/DRESSINGS) ×1 IMPLANT
GLOVE BIOGEL M 6.5 STRL (GLOVE) ×1 IMPLANT
GLOVE ECLIPSE 6.5 STRL STRAW (GLOVE) IMPLANT
GLOVE ECLIPSE 7.0 STRL STRAW (GLOVE) IMPLANT
GLOVE EXAM NITRILE LRG STRL (GLOVE) IMPLANT
GLOVE EXAM NITRILE MD LF STRL (GLOVE) ×1 IMPLANT
GLOVE SKINSENSE NS SZ6.5 (GLOVE)
GLOVE SKINSENSE STRL SZ6.5 (GLOVE) IMPLANT
HEALON 5 0.6 ML (INTRAOCULAR LENS) IMPLANT
KIT VITRECTOMY (OPHTHALMIC RELATED) IMPLANT
PAD ARMBOARD 7.5X6 YLW CONV (MISCELLANEOUS) ×1 IMPLANT
PROC W NO LENS (INTRAOCULAR LENS)
PROC W SPEC LENS (INTRAOCULAR LENS)
PROCESS W NO LENS (INTRAOCULAR LENS) IMPLANT
PROCESS W SPEC LENS (INTRAOCULAR LENS) IMPLANT
RING MALYGIN (MISCELLANEOUS) IMPLANT
SIGHTPATH CAT PROC W REG LENS (Ophthalmic Related) ×2 IMPLANT
SYSTEM DUOVISC (INTRAOCULAR LENS) IMPLANT
VISCOELASTIC ADDITIONAL (OPHTHALMIC RELATED) IMPLANT
WATER STERILE IRR 250ML POUR (IV SOLUTION) ×1 IMPLANT

## 2011-02-03 NOTE — Op Note (Signed)
Patient brought to the operating room and prepped and draped in the usual manner.  Lid speculum inserted in right eye.  Stab incision made at the twelve o'clock position.  Provisc instilled in the anterior chamber.   A 2.4 mm. Stab incision was made temporally.  An anterior capsulotomy was done with a bent 25 gauge needle.  The nucleus was hydrodissected.  The Phaco tip was inserted in the anterior chamber and the nucleus was emulsified.  CDE was 10.01.  The cortical material was then removed with the I and A tip.  Posterior capsule was the polished.  The anterior chamber was deepened with Provisc.  A 17.5 Diopter Rayner 570C IOL was then inserted in the capsular bag.  Provisc was then removed with the I and A tip.  The wound was then hydrated.  Patient sent to the Recovery Room in good condition with follow up in my office.

## 2011-02-03 NOTE — H&P (Signed)
No change in H and P from office. 

## 2011-02-03 NOTE — Anesthesia Postprocedure Evaluation (Signed)
  Anesthesia Post-op Note  Patient: [name].  Procedure(s) Performed:  CATARACT EXTRACTION PHACO AND INTRAOCULAR LENS PLACEMENT (IOC) - CDE:10.01  Patient Location: Short Stay  Anesthesia Type: MAC  Level of Consciousness: awake and alert   Airway and Oxygen Therapy: Patient Spontanous Breathing  Post-op Pain: none  Post-op Assessment: Post-op Vital signs reviewed, Patient's Cardiovascular Status Stable, Respiratory Function Stable, Patent Airway, No signs of Nausea or vomiting, Adequate PO intake and Pain level controlled  Post-op Vital Signs: Reviewed and stable  Complications: No apparent anesthesia complications

## 2011-02-03 NOTE — Anesthesia Procedure Notes (Signed)
Procedure Name: MAC Date/Time: 02/03/2011 8:24 AM Performed by: Minerva Areola Pre-anesthesia Checklist: Patient identified, Patient being monitored, Emergency Drugs available, Timeout performed and Suction available Patient Re-evaluated:Patient Re-evaluated prior to inductionOxygen Delivery Method: Nasal Cannula

## 2011-02-03 NOTE — Anesthesia Preprocedure Evaluation (Addendum)
Anesthesia Evaluation  Name, MR# and DOB Patient awake  General Assessment Comment  Reviewed: Allergy & Precautions, H&P , Patient's Chart, lab work & pertinent test results and reviewed documented beta blocker date and time   History of Anesthesia Complications Negative for: history of anesthetic complications  Airway Mallampati: II  Neck ROM: Full    Dental  (+) Edentulous Upper and Edentulous Lower   Pulmonary (+) shortness of breath and With exertion COPD  + decreased breath sounds    Cardiovascular hypertension, Pt. on medications and Pt. on home beta blockers + CAD and + CABG + Valvular Problems/Murmurs (AVR  MVR) Regular Normal   Neuro/Psych  GI/Hepatic/Renal (+) PUD,  GERD Medicated and Controlled     Endo/Other   Abdominal   Musculoskeletal  Hematology   Peds  Reproductive/Obstetrics   Anesthesia Other Findings             Anesthesia Physical Anesthesia Plan  ASA: III  Anesthesia Plan: MAC   Post-op Pain Management:    Induction:   Airway Management Planned: Nasal Cannula  Additional Equipment:   Intra-op Plan:   Post-operative Plan:   Informed Consent: I have reviewed the patients History and Physical, chart, labs and discussed the procedure including the risks, benefits and alternatives for the proposed anesthesia with the patient or authorized representative who has indicated his/her understanding and acceptance.     Plan Discussed with:   Anesthesia Plan Comments:         Anesthesia Quick Evaluation

## 2011-02-03 NOTE — Transfer of Care (Signed)
Immediate Anesthesia Transfer of Care Note  Patient: [name].  Procedure(s) Performed:  CATARACT EXTRACTION PHACO AND INTRAOCULAR LENS PLACEMENT (IOC) - CDE:10.01  Patient Location: Short Stay  Anesthesia Type: MAC  Level of Consciousness: awake  Airway & Oxygen Therapy: Patient Spontanous Breathing  Post-op Assessment: Report given to PACU RN, Post -op Vital signs reviewed and stable and Patient moving all extremities  Post vital signs: Reviewed and stable  Complications: No apparent anesthesia complications

## 2011-02-04 ENCOUNTER — Ambulatory Visit (INDEPENDENT_AMBULATORY_CARE_PROVIDER_SITE_OTHER): Payer: Medicare Other | Admitting: *Deleted

## 2011-02-04 DIAGNOSIS — Z954 Presence of other heart-valve replacement: Secondary | ICD-10-CM

## 2011-02-04 DIAGNOSIS — Z9889 Other specified postprocedural states: Secondary | ICD-10-CM

## 2011-02-04 DIAGNOSIS — I359 Nonrheumatic aortic valve disorder, unspecified: Secondary | ICD-10-CM

## 2011-02-04 DIAGNOSIS — I059 Rheumatic mitral valve disease, unspecified: Secondary | ICD-10-CM

## 2011-02-16 ENCOUNTER — Encounter (HOSPITAL_COMMUNITY): Payer: Self-pay | Admitting: Ophthalmology

## 2011-03-04 ENCOUNTER — Ambulatory Visit (INDEPENDENT_AMBULATORY_CARE_PROVIDER_SITE_OTHER): Payer: Medicare Other | Admitting: *Deleted

## 2011-03-04 DIAGNOSIS — I059 Rheumatic mitral valve disease, unspecified: Secondary | ICD-10-CM

## 2011-03-04 DIAGNOSIS — Z9889 Other specified postprocedural states: Secondary | ICD-10-CM

## 2011-03-04 DIAGNOSIS — I359 Nonrheumatic aortic valve disorder, unspecified: Secondary | ICD-10-CM

## 2011-03-04 DIAGNOSIS — Z954 Presence of other heart-valve replacement: Secondary | ICD-10-CM

## 2011-03-05 ENCOUNTER — Encounter: Payer: Self-pay | Admitting: Cardiology

## 2011-03-06 ENCOUNTER — Ambulatory Visit: Payer: Medicare Other | Admitting: Cardiology

## 2011-03-13 ENCOUNTER — Other Ambulatory Visit: Payer: Self-pay | Admitting: Cardiology

## 2011-03-16 ENCOUNTER — Encounter: Payer: Self-pay | Admitting: Cardiology

## 2011-03-19 ENCOUNTER — Encounter: Payer: Self-pay | Admitting: Cardiology

## 2011-03-19 ENCOUNTER — Ambulatory Visit (INDEPENDENT_AMBULATORY_CARE_PROVIDER_SITE_OTHER): Payer: Medicare Other | Admitting: Cardiology

## 2011-03-19 DIAGNOSIS — E785 Hyperlipidemia, unspecified: Secondary | ICD-10-CM

## 2011-03-19 DIAGNOSIS — K219 Gastro-esophageal reflux disease without esophagitis: Secondary | ICD-10-CM

## 2011-03-19 DIAGNOSIS — Z7901 Long term (current) use of anticoagulants: Secondary | ICD-10-CM

## 2011-03-19 DIAGNOSIS — D649 Anemia, unspecified: Secondary | ICD-10-CM

## 2011-03-19 DIAGNOSIS — I38 Endocarditis, valve unspecified: Secondary | ICD-10-CM

## 2011-03-19 DIAGNOSIS — N200 Calculus of kidney: Secondary | ICD-10-CM

## 2011-03-19 DIAGNOSIS — J4489 Other specified chronic obstructive pulmonary disease: Secondary | ICD-10-CM

## 2011-03-19 DIAGNOSIS — E871 Hypo-osmolality and hyponatremia: Secondary | ICD-10-CM

## 2011-03-19 DIAGNOSIS — Z9889 Other specified postprocedural states: Secondary | ICD-10-CM

## 2011-03-19 DIAGNOSIS — I2510 Atherosclerotic heart disease of native coronary artery without angina pectoris: Secondary | ICD-10-CM

## 2011-03-19 DIAGNOSIS — R7301 Impaired fasting glucose: Secondary | ICD-10-CM

## 2011-03-19 DIAGNOSIS — R7989 Other specified abnormal findings of blood chemistry: Secondary | ICD-10-CM

## 2011-03-19 DIAGNOSIS — R040 Epistaxis: Secondary | ICD-10-CM

## 2011-03-19 DIAGNOSIS — J449 Chronic obstructive pulmonary disease, unspecified: Secondary | ICD-10-CM

## 2011-03-19 DIAGNOSIS — F17200 Nicotine dependence, unspecified, uncomplicated: Secondary | ICD-10-CM

## 2011-03-19 DIAGNOSIS — R945 Abnormal results of liver function studies: Secondary | ICD-10-CM

## 2011-03-19 DIAGNOSIS — I10 Essential (primary) hypertension: Secondary | ICD-10-CM

## 2011-03-19 DIAGNOSIS — Z954 Presence of other heart-valve replacement: Secondary | ICD-10-CM

## 2011-03-19 MED ORDER — ATORVASTATIN CALCIUM 40 MG PO TABS: 40.0000 mg | ORAL_TABLET | Freq: Every day | ORAL | Status: DC

## 2011-03-19 NOTE — Assessment & Plan Note (Signed)
Hepatic profile will be reassessed.

## 2011-03-19 NOTE — Assessment & Plan Note (Signed)
Clinically, no issues related to valve function.

## 2011-03-19 NOTE — Progress Notes (Signed)
HPI : [name] returns to the office as scheduled for assessment and treatment of coronary disease and valvular heart disease.  Nearly 10 years have passed since his cardiac surgery, and he continues to do extremely well.  Unfortunately, he still smokes cigarettes, but he denies dyspnea, orthopnea, PND and chest discomfort.  He has had no new medical problems nor required urgent medical care over the past 12 months.  Uncomplicated bilateral cataract extraction was performed.  Current Outpatient Prescriptions on File Prior to Visit  Medication Sig Dispense Refill  . acetaminophen (TYLENOL) 500 MG tablet Take 1,000 mg by mouth every 6 (six) hours as needed. For pain       . amLODipine (NORVASC) 10 MG tablet Take 10 mg by mouth daily.       Marland Kitchen aspirin 81 MG EC tablet Take 81 mg by mouth daily.        . cloNIDine (CATAPRES) 0.1 MG tablet TAKE ONE TABLET BY MOUTH TWICE DAILY  60 tablet  2  . lisinopril (PRINIVIL,ZESTRIL) 20 MG tablet TAKE ONE TABLET BY MOUTH TWICE DAILY  60 tablet  2  . metoprolol (LOPRESSOR) 50 MG tablet TAKE ONE TABLET BY MOUTH TWICE DAILY  60 tablet  2  . omeprazole (PRILOSEC) 20 MG capsule Take 20 mg by mouth 2 (two) times daily.       . simvastatin (ZOCOR) 40 MG tablet Take 20 mg by mouth at bedtime.        Marland Kitchen warfarin (COUMADIN) 5 MG tablet Take 5-7.5 mg by mouth daily. Patient takes 1 tablet on Tue, Thur, Sat and Sun. Then 1.5 tablets on Mon, Wed and Fri.         No Known Allergies    Past medical history, social history, and family history reviewed and updated.  ROS: See history of present illness.  PHYSICAL EXAM: There were no vitals taken for this visit.  General-Well developed; no acute distress Body habitus-proportionate weight and height Neck-No JVD; no carotid bruits; slight transmission of systolic murmur to both carotids Lungs-clear lung fields; resonant to percussion; prolonged expiratory phase Cardiovascular-normal PMI; prosthetic S1 and S2; accentuated A2;  modest systolic ejection murmur at the left sternal border and cardiac base Abdomen-normal bowel sounds; soft and non-tender without masses or organomegaly Musculoskeletal-No deformities, no cyanosis or clubbing Neurologic-Normal cranial nerves; symmetric strength and tone Skin-Warm, no significant lesions Extremities-distal pulses intact; trace edema  EKG:   ASSESSMENT AND PLAN:

## 2011-03-19 NOTE — Assessment & Plan Note (Signed)
Repeat FBS will be obtained.

## 2011-03-19 NOTE — Assessment & Plan Note (Signed)
Unfortunately, patient is not inclined to attempt to discontinue cigarette smoking.

## 2011-03-19 NOTE — Assessment & Plan Note (Signed)
Blood pressure control has been excellent.  Although systolic is slightly elevated at this visit, patient reports daily assessments with systolic 135 or less and diastolic less than 85.  Current medications will be continued.

## 2011-03-19 NOTE — Assessment & Plan Note (Signed)
No recurrent problems since surgery undertaken 18 months ago.

## 2011-03-19 NOTE — Patient Instructions (Signed)
**Note De-Identified Ryer Asato Obfuscation** Your physician has recommended you make the following change in your medication: stop taking Simvastatin after you finish current bottle and start taking Atorvastatin 40 mg at bedtime  Your physician recommends that you return for lab work in: 2 months  Your physician recommends that you complete 3 hemoccult cards (please follow instructions included in envelope) and return to this office when complete  Your physician recommends that you schedule a follow-up appointment in: 1 year

## 2011-03-19 NOTE — Assessment & Plan Note (Signed)
Electrolytes will be reassessed.

## 2011-03-19 NOTE — Assessment & Plan Note (Signed)
Lipid profile was somewhat suboptimal one year ago.  Due to new FDA guidelines, he can no longer combined simvastatin with amlodipine.  Atorvastatin will be substituted at a dose of 40 mg qd and a lipid profile obtained in 2 months.

## 2011-03-19 NOTE — Assessment & Plan Note (Signed)
CBC will be reassessed.

## 2011-03-19 NOTE — Assessment & Plan Note (Signed)
No evidence for progression of coronary disease.  Optimal control of cardiovascular risk factors will continue to be the focus of therapy.

## 2011-04-01 ENCOUNTER — Ambulatory Visit (INDEPENDENT_AMBULATORY_CARE_PROVIDER_SITE_OTHER): Payer: Medicare Other | Admitting: *Deleted

## 2011-04-01 DIAGNOSIS — Z9889 Other specified postprocedural states: Secondary | ICD-10-CM

## 2011-04-01 DIAGNOSIS — I359 Nonrheumatic aortic valve disorder, unspecified: Secondary | ICD-10-CM

## 2011-04-01 DIAGNOSIS — I059 Rheumatic mitral valve disease, unspecified: Secondary | ICD-10-CM

## 2011-04-01 DIAGNOSIS — Z954 Presence of other heart-valve replacement: Secondary | ICD-10-CM

## 2011-04-01 LAB — POCT INR: INR: 3.3

## 2011-04-07 ENCOUNTER — Encounter (INDEPENDENT_AMBULATORY_CARE_PROVIDER_SITE_OTHER): Payer: Medicare Other

## 2011-04-07 DIAGNOSIS — Z7901 Long term (current) use of anticoagulants: Secondary | ICD-10-CM

## 2011-04-24 NOTE — Progress Notes (Signed)
**Note De-Identified Kayann Maj Obfuscation** Due the week of11-12-12./LV

## 2011-04-29 ENCOUNTER — Ambulatory Visit (INDEPENDENT_AMBULATORY_CARE_PROVIDER_SITE_OTHER): Payer: Medicare Other | Admitting: *Deleted

## 2011-04-29 DIAGNOSIS — I059 Rheumatic mitral valve disease, unspecified: Secondary | ICD-10-CM

## 2011-04-29 DIAGNOSIS — Z954 Presence of other heart-valve replacement: Secondary | ICD-10-CM

## 2011-04-29 DIAGNOSIS — I359 Nonrheumatic aortic valve disorder, unspecified: Secondary | ICD-10-CM

## 2011-04-29 DIAGNOSIS — Z7901 Long term (current) use of anticoagulants: Secondary | ICD-10-CM

## 2011-04-29 DIAGNOSIS — Z9889 Other specified postprocedural states: Secondary | ICD-10-CM

## 2011-04-29 LAB — POCT INR: INR: 2.8

## 2011-05-18 ENCOUNTER — Other Ambulatory Visit: Payer: Self-pay | Admitting: Cardiology

## 2011-05-19 ENCOUNTER — Encounter: Payer: Self-pay | Admitting: *Deleted

## 2011-05-19 LAB — LIPID PANEL
Cholesterol: 171 mg/dL (ref 0–200)
Triglycerides: 49 mg/dL (ref <150)

## 2011-05-19 LAB — CBC WITH DIFFERENTIAL/PLATELET
Basophils Absolute: 0 10*3/uL (ref 0.0–0.1)
Basophils Relative: 1 % (ref 0–1)
Hemoglobin: 10.6 g/dL — ABNORMAL LOW (ref 13.0–17.0)
MCHC: 30.9 g/dL (ref 30.0–36.0)
Neutro Abs: 4.7 10*3/uL (ref 1.7–7.7)
Neutrophils Relative %: 76 % (ref 43–77)
Platelets: 382 10*3/uL (ref 150–400)
RDW: 14.1 % (ref 11.5–15.5)

## 2011-05-19 LAB — COMPREHENSIVE METABOLIC PANEL
Albumin: 4.1 g/dL (ref 3.5–5.2)
BUN: 16 mg/dL (ref 6–23)
CO2: 24 mEq/L (ref 19–32)
Calcium: 8.9 mg/dL (ref 8.4–10.5)
Chloride: 96 mEq/L (ref 96–112)
Creat: 0.84 mg/dL (ref 0.50–1.35)

## 2011-05-25 ENCOUNTER — Encounter (HOSPITAL_COMMUNITY): Payer: Self-pay

## 2011-05-25 ENCOUNTER — Other Ambulatory Visit: Payer: Self-pay

## 2011-05-25 ENCOUNTER — Inpatient Hospital Stay (HOSPITAL_COMMUNITY)
Admission: EM | Admit: 2011-05-25 | Discharge: 2011-05-26 | DRG: 293 | Disposition: A | Discharge status: Home Health | Payer: Medicare Other | Attending: Pulmonary Disease | Admitting: Pulmonary Disease

## 2011-05-25 ENCOUNTER — Emergency Department (HOSPITAL_COMMUNITY): Payer: Medicare Other

## 2011-05-25 DIAGNOSIS — E785 Hyperlipidemia, unspecified: Secondary | ICD-10-CM | POA: Diagnosis present

## 2011-05-25 DIAGNOSIS — I5031 Acute diastolic (congestive) heart failure: Secondary | ICD-10-CM | POA: Diagnosis present

## 2011-05-25 DIAGNOSIS — Z7901 Long term (current) use of anticoagulants: Secondary | ICD-10-CM | POA: Diagnosis present

## 2011-05-25 DIAGNOSIS — J441 Chronic obstructive pulmonary disease with (acute) exacerbation: Secondary | ICD-10-CM

## 2011-05-25 DIAGNOSIS — I509 Heart failure, unspecified: Secondary | ICD-10-CM | POA: Diagnosis present

## 2011-05-25 DIAGNOSIS — Z952 Presence of prosthetic heart valve: Secondary | ICD-10-CM | POA: Diagnosis present

## 2011-05-25 DIAGNOSIS — I2510 Atherosclerotic heart disease of native coronary artery without angina pectoris: Secondary | ICD-10-CM | POA: Diagnosis present

## 2011-05-25 DIAGNOSIS — R0600 Dyspnea, unspecified: Secondary | ICD-10-CM

## 2011-05-25 DIAGNOSIS — Z954 Presence of other heart-valve replacement: Secondary | ICD-10-CM

## 2011-05-25 DIAGNOSIS — I359 Nonrheumatic aortic valve disorder, unspecified: Secondary | ICD-10-CM

## 2011-05-25 DIAGNOSIS — D649 Anemia, unspecified: Secondary | ICD-10-CM | POA: Diagnosis present

## 2011-05-25 DIAGNOSIS — Z9889 Other specified postprocedural states: Secondary | ICD-10-CM | POA: Diagnosis present

## 2011-05-25 DIAGNOSIS — Z951 Presence of aortocoronary bypass graft: Secondary | ICD-10-CM

## 2011-05-25 DIAGNOSIS — J449 Chronic obstructive pulmonary disease, unspecified: Secondary | ICD-10-CM | POA: Diagnosis present

## 2011-05-25 DIAGNOSIS — J4489 Other specified chronic obstructive pulmonary disease: Secondary | ICD-10-CM | POA: Diagnosis present

## 2011-05-25 LAB — PRO B NATRIURETIC PEPTIDE: Pro B Natriuretic peptide (BNP): 4112 pg/mL — ABNORMAL HIGH (ref 0–125)

## 2011-05-25 LAB — URINALYSIS, ROUTINE W REFLEX MICROSCOPIC
Glucose, UA: NEGATIVE mg/dL
Specific Gravity, Urine: 1.005 (ref 1.005–1.030)
pH: 6 (ref 5.0–8.0)

## 2011-05-25 LAB — BASIC METABOLIC PANEL
CO2: 26 mEq/L (ref 19–32)
Glucose, Bld: 178 mg/dL — ABNORMAL HIGH (ref 70–99)
Potassium: 3.4 mEq/L — ABNORMAL LOW (ref 3.5–5.1)
Sodium: 133 mEq/L — ABNORMAL LOW (ref 135–145)

## 2011-05-25 LAB — CBC
Hemoglobin: 9.7 g/dL — ABNORMAL LOW (ref 13.0–17.0)
MCV: 88.4 fL (ref 78.0–100.0)
Platelets: 306 10*3/uL (ref 150–400)
RBC: 3.28 MIL/uL — ABNORMAL LOW (ref 4.22–5.81)
WBC: 4.6 10*3/uL (ref 4.0–10.5)

## 2011-05-25 LAB — DIFFERENTIAL
Lymphocytes Relative: 8 % — ABNORMAL LOW (ref 12–46)
Lymphs Abs: 0.4 10*3/uL — ABNORMAL LOW (ref 0.7–4.0)
Neutrophils Relative %: 75 % (ref 43–77)

## 2011-05-25 LAB — MRSA PCR SCREENING: MRSA by PCR: NEGATIVE

## 2011-05-25 LAB — POCT I-STAT TROPONIN I: Troponin i, poc: 0.01 ng/mL (ref 0.00–0.08)

## 2011-05-25 LAB — URINE MICROSCOPIC-ADD ON

## 2011-05-25 MED ORDER — ONDANSETRON HCL 4 MG/2ML IJ SOLN: 4.0000 mg | Freq: Four times a day (QID) | INTRAMUSCULAR | Status: DC | PRN

## 2011-05-25 MED ORDER — HYDROCODONE-ACETAMINOPHEN 5-325 MG PO TABS: 1.0000 | ORAL_TABLET | ORAL | Status: DC | PRN

## 2011-05-25 MED ORDER — ACETAMINOPHEN 650 MG RE SUPP: 650.0000 mg | Freq: Four times a day (QID) | RECTAL | Status: DC | PRN

## 2011-05-25 MED ORDER — ALBUTEROL SULFATE (5 MG/ML) 0.5% IN NEBU
5.0000 mg | INHALATION_SOLUTION | Freq: Once | RESPIRATORY_TRACT | Status: AC
  Administered 2011-05-25: 5 mg via RESPIRATORY_TRACT
  Filled 2011-05-25: qty 1

## 2011-05-25 MED ORDER — AMLODIPINE BESYLATE 5 MG PO TABS
10.0000 mg | ORAL_TABLET | Freq: Every day | ORAL | Status: DC
  Administered 2011-05-25: 10 mg via ORAL
  Filled 2011-05-25: qty 2

## 2011-05-25 MED ORDER — FENTANYL CITRATE 0.05 MG/ML IJ SOLN: 25.0000 ug | INTRAMUSCULAR | Status: DC | PRN

## 2011-05-25 MED ORDER — IPRATROPIUM BROMIDE 0.02 % IN SOLN
0.5000 mg | Freq: Once | RESPIRATORY_TRACT | Status: AC
  Administered 2011-05-25: 0.5 mg via RESPIRATORY_TRACT
  Filled 2011-05-25: qty 2.5

## 2011-05-25 MED ORDER — ALUM & MAG HYDROXIDE-SIMETH 200-200-20 MG/5ML PO SUSP: 30.0000 mL | Freq: Four times a day (QID) | ORAL | Status: DC | PRN

## 2011-05-25 MED ORDER — FUROSEMIDE 10 MG/ML IJ SOLN
40.0000 mg | Freq: Once | INTRAMUSCULAR | Status: AC
  Administered 2011-05-25: 40 mg via INTRAVENOUS
  Filled 2011-05-25: qty 4

## 2011-05-25 MED ORDER — POTASSIUM CHLORIDE CRYS ER 20 MEQ PO TBCR
20.0000 meq | EXTENDED_RELEASE_TABLET | Freq: Two times a day (BID) | ORAL | Status: DC
  Administered 2011-05-25 – 2011-05-26 (×3): 20 meq via ORAL
  Filled 2011-05-25 (×3): qty 1

## 2011-05-25 MED ORDER — SODIUM CHLORIDE 0.9 % IV SOLN
INTRAVENOUS | Status: DC
  Administered 2011-05-25: 08:00:00 via INTRAVENOUS

## 2011-05-25 MED ORDER — WARFARIN SODIUM 1 MG PO TABS
1.0000 mg | ORAL_TABLET | Freq: Once | ORAL | Status: AC
  Administered 2011-05-25: 1 mg via ORAL
  Filled 2011-05-25: qty 1

## 2011-05-25 MED ORDER — ENOXAPARIN SODIUM 40 MG/0.4ML ~~LOC~~ SOLN: 40.0000 mg | SUBCUTANEOUS | Status: DC

## 2011-05-25 MED ORDER — SIMVASTATIN 20 MG PO TABS
20.0000 mg | ORAL_TABLET | Freq: Every day | ORAL | Status: DC
  Administered 2011-05-25: 20 mg via ORAL
  Filled 2011-05-25: qty 1

## 2011-05-25 MED ORDER — LISINOPRIL 10 MG PO TABS
10.0000 mg | ORAL_TABLET | Freq: Every day | ORAL | Status: DC
  Administered 2011-05-26: 10 mg via ORAL
  Filled 2011-05-25 (×2): qty 1

## 2011-05-25 MED ORDER — ONDANSETRON HCL 4 MG/2ML IJ SOLN: 4.0000 mg | Freq: Once | INTRAMUSCULAR | Status: DC | PRN

## 2011-05-25 MED ORDER — ONDANSETRON HCL 4 MG PO TABS: 4.0000 mg | ORAL_TABLET | Freq: Four times a day (QID) | ORAL | Status: DC | PRN

## 2011-05-25 MED ORDER — METOPROLOL TARTRATE 50 MG PO TABS
50.0000 mg | ORAL_TABLET | Freq: Two times a day (BID) | ORAL | Status: DC
  Administered 2011-05-25 – 2011-05-26 (×2): 50 mg via ORAL
  Filled 2011-05-25 (×3): qty 1

## 2011-05-25 MED ORDER — NITROGLYCERIN 2 % TD OINT
1.0000 [in_us] | TOPICAL_OINTMENT | Freq: Once | TRANSDERMAL | Status: AC
  Administered 2011-05-25: 66.6667 [in_us] via TOPICAL

## 2011-05-25 MED ORDER — ACETAMINOPHEN 325 MG PO TABS: 650.0000 mg | ORAL_TABLET | Freq: Four times a day (QID) | ORAL | Status: DC | PRN

## 2011-05-25 MED ORDER — ALBUTEROL SULFATE (5 MG/ML) 0.5% IN NEBU
2.5000 mg | INHALATION_SOLUTION | Freq: Once | RESPIRATORY_TRACT | Status: AC
  Administered 2011-05-25: 2.5 mg via RESPIRATORY_TRACT
  Filled 2011-05-25: qty 0.5

## 2011-05-25 MED ORDER — CLONIDINE HCL 0.1 MG PO TABS
0.1000 mg | ORAL_TABLET | Freq: Two times a day (BID) | ORAL | Status: DC
  Administered 2011-05-25 – 2011-05-26 (×3): 0.1 mg via ORAL
  Filled 2011-05-25 (×3): qty 1

## 2011-05-25 MED ORDER — SODIUM CHLORIDE 0.9 % IJ SOLN
3.0000 mL | Freq: Two times a day (BID) | INTRAMUSCULAR | Status: DC
  Administered 2011-05-25: 3 mL via INTRAVENOUS
  Filled 2011-05-25: qty 3

## 2011-05-25 MED ORDER — PANTOPRAZOLE SODIUM 40 MG PO TBEC
40.0000 mg | DELAYED_RELEASE_TABLET | Freq: Every day | ORAL | Status: DC
  Administered 2011-05-25: 40 mg via ORAL
  Filled 2011-05-25: qty 1

## 2011-05-25 MED ORDER — WARFARIN SODIUM 5 MG PO TABS: 5.0000 mg | ORAL_TABLET | Freq: Every day | ORAL | Status: DC

## 2011-05-25 MED ORDER — FUROSEMIDE 10 MG/ML IJ SOLN
40.0000 mg | Freq: Two times a day (BID) | INTRAMUSCULAR | Status: DC
  Administered 2011-05-25 (×2): 40 mg via INTRAVENOUS
  Filled 2011-05-25 (×2): qty 4

## 2011-05-25 MED ORDER — SODIUM CHLORIDE 0.9 % IV SOLN: 250.0000 mL | INTRAVENOUS | Status: DC

## 2011-05-25 MED ORDER — ASPIRIN EC 81 MG PO TBEC
81.0000 mg | DELAYED_RELEASE_TABLET | Freq: Every day | ORAL | Status: DC
  Administered 2011-05-26: 81 mg via ORAL
  Filled 2011-05-25 (×2): qty 1

## 2011-05-25 MED ORDER — TRAZODONE HCL 50 MG PO TABS: 25.0000 mg | ORAL_TABLET | Freq: Every evening | ORAL | Status: DC | PRN

## 2011-05-25 MED ORDER — SENNA 8.6 MG PO TABS: 2.0000 | ORAL_TABLET | Freq: Every day | ORAL | Status: DC | PRN

## 2011-05-25 MED ORDER — NITROGLYCERIN 2 % TD OINT
TOPICAL_OINTMENT | TRANSDERMAL | Status: AC
  Filled 2011-05-25: qty 1

## 2011-05-25 NOTE — Consult Note (Signed)
ANTICOAGULATION CONSULT NOTE - Initial Consult  Pharmacy Consult for Warfarin Indication: mechanical valve  No Known Allergies  Patient Measurements: Height: 5' 6.5" (168.9 cm) Weight: 164 lb (74.39 kg) IBW/kg (Calculated) : 64.95   Vital Signs: Temp: 98.7 F (37.1 C) (11/19 0653) Temp src: Oral (11/19 0653) BP: 129/62 mmHg (11/19 1100) Pulse Rate: 70  (11/19 1100)  Labs:  Basename 05/25/11 0732 05/25/11 0657  HGB -- 9.7*  HCT -- 29.0*  PLT -- 306  APTT -- --  LABPROT 37.2* --  INR 3.69* --  HEPARINUNFRC -- --  CREATININE -- 0.84  CKTOTAL -- --  CKMB -- --  TROPONINI -- --   Estimated Creatinine Clearance: 80.6 ml/min (by C-G formula based on Cr of 0.84).  Medical History: Past Medical History  Diagnosis Date  . Hypertension   . Arteriosclerotic cardiovascular disease (ASCVD) 2003    CABG/MVR/AVR-2003 (#25 St. Jude/#21 St. Jude); anticoagulation; negative stress nuclear-2005  . Epistaxis     requiring catery & aterial ligation-09/2009  . GERD (gastroesophageal reflux disease)   . Hyperlipemia   . Abnormal LFTs     possible cirrhosis  . Chronic anticoagulation   . Valvular heart disease     S/P AVR/MVR  . Tobacco abuse     45 pack years  . Fasting hyperglycemia   . Nephrolithiasis   . 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 Diverticulosis   . History of peptic ulcer disease     Remote  . Hyponatremia    Medications:  Prescriptions prior to admission  Medication Sig Dispense Refill  . acetaminophen (TYLENOL) 500 MG tablet Take 1,000 mg by mouth every 6 (six) hours as needed. For pain       . amLODipine (NORVASC) 10 MG tablet Take 10 mg by mouth at bedtime.       Marland Kitchen aspirin 81 MG EC tablet Take 81 mg by mouth daily.        Marland Kitchen atorvastatin (LIPITOR) 40 MG tablet Take 1 tablet (40 mg total) by mouth daily. To replace Simvastatin  30 tablet  12  . cloNIDine (CATAPRES) 0.1 MG tablet TAKE ONE TABLET BY MOUTH TWICE DAILY  60 tablet  2  . lisinopril  (PRINIVIL,ZESTRIL) 20 MG tablet TAKE ONE TABLET BY MOUTH TWICE DAILY  60 tablet  2  . metoprolol (LOPRESSOR) 50 MG tablet TAKE ONE TABLET BY MOUTH TWICE DAILY  60 tablet  2  . omeprazole (PRILOSEC) 20 MG capsule Take 20 mg by mouth 2 (two) times daily.       . simvastatin (ZOCOR) 20 MG tablet Take 20 mg by mouth at bedtime. Patient has about 20 tabs remaining and this med will be replaced with the Lipitor       . warfarin (COUMADIN) 5 MG tablet Take 5 mg by mouth at bedtime. Patient takes 1 tab on Sunday, Tuesday, and Thursday Patient takes 1.5 tabs on Monday, Wednesday, Friday, and Saturdays       . warfarin (COUMADIN) 5 MG tablet         Assessment: INR slightly above goal (2.5-3.5) for mechanical valve PTA dose noted  Goal of Therapy: INR 2.5 - 3.5   Plan: Reduce Coumadin dose to 1mg  today INR daily until stable  Margo Aye, Shrita Thien A 05/25/2011,12:19 PM

## 2011-05-25 NOTE — ED Notes (Signed)
I stat Troponin 0.01

## 2011-05-25 NOTE — ED Notes (Signed)
Pt states he has not felt well for several weeks "ever since I got my flu shot and pneumonia shot end of October"  Pt reports using his nebulizer at home without relief.

## 2011-05-25 NOTE — ED Provider Notes (Signed)
History    Scribed for [name] Anger, DO, the patient was seen in room APA18/APA18. This chart was scribed by Katha Cabal.   CSN: [id number] Arrival date & time: 05/25/2011  6:45 AM   Chief Complaint  Patient presents with  . Shortness of Breath     HPI  Pt was seen at 7:21 AM. [name]. is a 65 y.o. male who presents to the Emergency Department complaining of gradual onset and worsening of persistent shortness of breath that began 3-4 days ago.  Patient used inhaler this AM with mild relief. Patient states he ran out of nebulizer medication an unk time ago.  Symptoms are associated with occasional cough and bilateral leg swelling.  Shortness of breath is worsened by lying flat and walking.  Denies CP/palpitations, no fevers, no abd pain, no N/V/D, no back pain.      PCP Fredirick Maudlin, MD  Past Medical History  Diagnosis Date  . Hypertension   . Arteriosclerotic cardiovascular disease (ASCVD) 2003    CABG/MVR/AVR-2003 (#25 St. Jude/#21 St. Jude); anticoagulation; negative stress nuclear-2005  . Epistaxis     requiring catery & aterial ligation-09/2009  . GERD (gastroesophageal reflux disease)   . Hyperlipemia   . Abnormal LFTs     possible cirrhosis  . Chronic anticoagulation   . Valvular heart disease     S/P AVR/MVR  . Tobacco abuse     45 pack years  . Fasting hyperglycemia   . Nephrolithiasis   . 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 Diverticulosis   . History of peptic ulcer disease     Remote  . Hyponatremia     Past Surgical History  Procedure Date  . Endocopic sphenopalatine artery ligation & cautry   . Inguinal hernia repair     Left & right  . Coronary artery bypass graft 11/2001    [facility]  . Cardiac valve replacement 11/2001    AVR and MVR-St. Jude devices  . Cataract extraction w/phaco 01/20/2011    Procedure: CATARACT EXTRACTION PHACO AND INTRAOCULAR LENS PLACEMENT (IOC);  Surgeon: Loraine Leriche T. Nile Riggs;  Location: [facility];   Service: Ophthalmology;  Laterality: Right;  CDE: 8.51  . Cataract extraction w/phaco 02/03/2011    Procedure: CATARACT EXTRACTION PHACO AND INTRAOCULAR LENS PLACEMENT (IOC);  Surgeon: Loraine Leriche T. Nile Riggs;  Location: [facility];  Service: Ophthalmology;  Laterality: Left;  CDE:10.01    Family History  Problem Relation Age of Onset  . Hypotension Neg Hx   . Anesthesia problems Neg Hx   . Malignant hyperthermia Neg Hx   . Pseudochol deficiency Neg Hx     History  Substance Use Topics  . Smoking status: Current Everyday Smoker -- 1.0 packs/day for 40 years    Types: Cigarettes  . Smokeless tobacco: Not on file  . Alcohol Use: 0.0 oz/week    Allergies   No Known Allergies   Home Medications    Current Outpatient Rx  Name Route Sig Dispense Refill  . ACETAMINOPHEN 500 MG PO TABS Oral Take 1,000 mg by mouth every 6 (six) hours as needed. For pain     . AMLODIPINE BESYLATE 10 MG PO TABS Oral Take 10 mg by mouth daily.     . ASPIRIN 81 MG PO TBEC Oral Take 81 mg by mouth daily.      . ATORVASTATIN CALCIUM 40 MG PO TABS Oral Take 1 tablet (40 mg total) by mouth daily. To replace Simvastatin 30 tablet  12  . CLONIDINE HCL 0.1 MG PO TABS  TAKE ONE TABLET BY MOUTH TWICE DAILY 60 tablet 2  . LISINOPRIL 20 MG PO TABS  TAKE ONE TABLET BY MOUTH TWICE DAILY 60 tablet 2  . METOPROLOL TARTRATE 50 MG PO TABS  TAKE ONE TABLET BY MOUTH TWICE DAILY 60 tablet 2  . OMEPRAZOLE 20 MG PO CPDR Oral Take 20 mg by mouth 2 (two) times daily.     . WARFARIN SODIUM 5 MG PO TABS  TAKE ONE TABLET BY MOUTH EVERY DAY EXCEPT TAKE ONE & ONE-HALF TABLETS ON MONDAYS, WEDNESDAYS, AND FRIDAYS OR TAKE AS DIRECTED BY ANTICOAGULA 60 tablet 1     Review of Systems ROS: Statement: All systems negative except as marked or noted in the HPI; Constitutional: Negative for fever and chills. ; ; Eyes: Negative for eye pain, redness and discharge. ; ; ENMT: Negative for ear pain, hoarseness, nasal congestion, sinus pressure and sore throat.  ; ; Cardiovascular: Negative for chest pain, palpitations, diaphoresis, +dyspnea, orthopnea, and peripheral edema. ; ; Respiratory: Negative for cough, wheezing and stridor. ; ; Gastrointestinal: Negative for nausea, vomiting, diarrhea and abdominal pain, blood in stool, hematemesis, jaundice and rectal bleeding. . ; ; Genitourinary: Negative for dysuria, flank pain and hematuria. ; ; Musculoskeletal: Negative for back pain and neck pain. Negative for swelling and trauma.; ; Skin: Negative for pruritus, rash, abrasions, blisters, bruising and skin lesion.; ; Neuro: Negative for headache, lightheadedness and neck stiffness. Negative for weakness, altered level of consciousness , altered mental status, extremity weakness, paresthesias, involuntary movement, seizure and syncope.     BP 142/53  Pulse 80  Temp(Src) 98.7 F (37.1 C) (Oral)  Resp 28  Ht 5' 6.5" (1.689 m)  Wt 164 lb (74.39 kg)  BMI 26.07 kg/m2  SpO2 93%  Physical Exam 0725: Physical examination:  Nursing notes reviewed; Vital signs and O2 SAT reviewed;  Constitutional: Well developed, Well nourished, In no acute distress; Head:  Normocephalic, atraumatic; Eyes: EOMI, PERRL, No scleral icterus; ENMT: Mouth and pharynx normal, Mucous membranes dry; Neck: Supple, Full range of motion, No lymphadenopathy; Cardiovascular: Regular rate and rhythm, No murmur, rub, or gallop; Respiratory: Breath sounds diminished, coarse & equal bilaterally, No wheezes or stridor, Speaking full sentences. Normal respiratory effort/excursion; Chest: Nontender, Movement normal; Abdomen: Soft, Nontender, Nondistended, Normal bowel sounds; Extremities: Pulses normal, No tenderness, 1+ pedal edema bilat, No calf asymmetry.; Neuro: AA&Ox3, Major CN grossly intact. Speech clear, no facial droop.  No gross focal motor or sensory deficits in extremities.; Skin: Color normal, Warm, Dry, no rash.    ED Course  Procedures   0725:  During exam pt's O2 Sat on R/A dropped to  88% while talking to me, appeared tachypneic and SOB.  Pt placed back on O2 3L N/C with increase Sats to 91-93%, appeared more comfortable.  Will continue to monitor.   MDM  MDM Reviewed: previous chart and nursing note Interpretation: ECG, labs and x-ray    Date: 05/25/2011  Rate: 87  Rhythm: normal sinus rhythm and premature ventricular contractions (PVC)  QRS Axis: normal  Intervals: normal  ST/T Wave abnormalities: nonspecific ST/T changes  Conduction Disutrbances:none  Narrative Interpretation:   Old EKG Reviewed: unchanged; no significant changes from previous EKG ddated 01/14/2011.  Dg Chest Portable 1 View  05/25/2011  *RADIOLOGY REPORT*  Clinical Data: Shortness of breath.  PORTABLE CHEST - 1 VIEW  Comparison: 03/13/2005 radiographs.  Findings: 0735 hours. There is stable cardiomegaly status post CABG, AVR  and MVR.  Hilar prominence appears unchanged.  There is mild vascular congestion without overt pulmonary edema.  There is no confluent airspace opacity.  A small amount of pleural fluid bilaterally is likely.  Multiple telemetry leads overlie the chest.  IMPRESSION: Stable cardiomegaly with chronic vascular congestion.  Possible small bilateral pleural effusions.  Original Report Authenticated By: Gerrianne Scale, M.D.    Results for orders placed during the hospital encounter of 05/25/11  CBC      Component Value Range   WBC 4.6  4.0 - 10.5 (K/uL)   RBC 3.28 (*) 4.22 - 5.81 (MIL/uL)   Hemoglobin 9.7 (*) 13.0 - 17.0 (g/dL)   HCT 16.1 (*) 09.6 - 52.0 (%)   MCV 88.4  78.0 - 100.0 (fL)   MCH 29.6  26.0 - 34.0 (pg)   MCHC 33.4  30.0 - 36.0 (g/dL)   RDW 04.5  40.9 - 81.1 (%)   Platelets 306  150 - 400 (K/uL)  DIFFERENTIAL      Component Value Range   Neutrophils Relative 75  43 - 77 (%)   Neutro Abs 3.4  1.7 - 7.7 (K/uL)   Lymphocytes Relative 8 (*) 12 - 46 (%)   Lymphs Abs 0.4 (*) 0.7 - 4.0 (K/uL)   Monocytes Relative 11  3 - 12 (%)   Monocytes Absolute 0.5  0.1 -  1.0 (K/uL)   Eosinophils Relative 6 (*) 0 - 5 (%)   Eosinophils Absolute 0.3  0.0 - 0.7 (K/uL)   Basophils Relative 1  0 - 1 (%)   Basophils Absolute 0.1  0.0 - 0.1 (K/uL)  BASIC METABOLIC PANEL      Component Value Range   Sodium 133 (*) 135 - 145 (mEq/L)   Potassium 3.4 (*) 3.5 - 5.1 (mEq/L)   Chloride 95 (*) 96 - 112 (mEq/L)   CO2 26  19 - 32 (mEq/L)   Glucose, Bld 178 (*) 70 - 99 (mg/dL)   BUN 19  6 - 23 (mg/dL)   Creatinine, Ser 9.14  0.50 - 1.35 (mg/dL)   Calcium 8.2 (*) 8.4 - 10.5 (mg/dL)   GFR calc non Af Amer 90 (*) >90 (mL/min)   GFR calc Af Amer >90  >90 (mL/min)  PROTIME-INR      Component Value Range   Prothrombin Time 37.2 (*) 11.6 - 15.2 (seconds)   INR 3.69 (*) 0.00 - 1.49   PRO B NATRIURETIC PEPTIDE      Component Value Range   BNP, POC 4112.0 (*) 0 - 125 (pg/mL)   POC Troponin 0.01 (did not cross over into EPIC chart).   8:31 AM:  H/H per baseline, Na per baseline.  BNP elevated (no old to compare), bilat pleural effusions on CXR with pulm vasc congestion; will tx for CHF with ntg and lasix.  Nebs x2 given with Sats remaining low to mid 90%'s.  VS otherwise remain stable.  Denies CP.  Will admit.  Dx testing d/w pt and family.  Questions answered.  Verb understanding, agreeable to admit.   8:46 AM:  Pt sitting on side of stretcher talking with family and ED staff at bedside, Sats 97% on O2 N/C after meds.  Appears comfortable.  T/C to Dr. Juanetta Gosling, case discussed, including:  HPI, pertinent PM/SHx, VS/PE, dx testing, ED course and treatment.  Agreeable to admit.  He will come to ED for eval.        [facility] I personally performed the services described in this  documentation, which was scribed in my presence. The recorded information has been reviewed and considered.               [name] Anger, DO 05/25/11 1906

## 2011-05-25 NOTE — H&P (Signed)
[name]. MRN: [id number] DOB/AGE: 14-Mar-1946 65 y.o. 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 He says he started developing increasing shortness of breath about 3 days ago. He noticed that his ankles were swelling. He had not changed any medications. He denies any chest pain. He has not noticed any bleeding.  Past Medical History  Diagnosis Date  . Hypertension   . Arteriosclerotic cardiovascular disease (ASCVD) 2003    CABG/MVR/AVR-2003 (#25 St. Jude/#21 St. Jude); anticoagulation; negative stress nuclear-2005  . Epistaxis     requiring catery & aterial ligation-09/2009  . GERD (gastroesophageal reflux disease)   . Hyperlipemia   . Abnormal LFTs     possible cirrhosis  . Chronic anticoagulation   . Valvular heart disease     S/P AVR/MVR  . Tobacco abuse     45 pack years  . Fasting hyperglycemia   . Nephrolithiasis   . 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 Diverticulosis   . History of peptic ulcer disease     Remote  . Hyponatremia    Past Surgical History  Procedure Date  . Endocopic sphenopalatine artery ligation & cautry   . Inguinal hernia repair     Left & right  . Coronary artery bypass graft 11/2001    [facility]  . Cardiac valve replacement 11/2001    AVR and MVR-St. Jude devices  . Cataract extraction w/phaco 01/20/2011    Procedure: CATARACT EXTRACTION PHACO AND INTRAOCULAR LENS PLACEMENT (IOC);  Surgeon: Loraine Leriche T. Nile Riggs;  Location: [facility];  Service: Ophthalmology;  Laterality: Right;  CDE: 8.51  . Cataract extraction w/phaco 02/03/2011    Procedure: CATARACT EXTRACTION PHACO AND INTRAOCULAR LENS PLACEMENT (IOC);  Surgeon: Loraine Leriche T. Nile Riggs;  Location: [facility];  Service: Ophthalmology;  Laterality: Left;  CDE:10.01         Family History  Problem Relation Age of Onset  . Hypotension Neg Hx   . Anesthesia problems Neg Hx   . Malignant hyperthermia Neg Hx   . Pseudochol deficiency Neg Hx     Social History:  reports that he has been smoking Cigarettes.  He has a 40 pack-year smoking history. He does not have any smokeless tobacco history on file. He reports that he drinks alcohol. He reports that he does not use illicit drugs. He is a widower  Allergies: No Known Allergies  Medications Prior to Admission  Medication Dose Route Frequency Provider Last Rate Last Dose  . 0.9 %  sodium chloride infusion   Intravenous Continuous [name], DO 50 mL/hr at 05/25/11 4098    . albuterol (PROVENTIL) (5 MG/ML) 0.5% nebulizer solution 2.5 mg  2.5 mg Nebulization Once [name], DO   2.5 mg at 05/25/11 0709  . albuterol (PROVENTIL) (5 MG/ML) 0.5% nebulizer solution 5 mg  5 mg Nebulization Once [name], DO   5 mg at 05/25/11 0759  . furosemide (LASIX) injection 40 mg  40 mg Intravenous Once [name], DO   40 mg at 05/25/11 1191  . ipratropium (ATROVENT) nebulizer solution 0.5 mg  0.5 mg Nebulization Once [name], DO   0.5 mg at 05/25/11 4782  . ipratropium (ATROVENT) nebulizer solution 0.5 mg  0.5 mg Nebulization Once [name], DO   0.5 mg  at 05/25/11 0759  . nitroGLYCERIN (NITROGLYN) 2 % ointment 1 inch  1 inch Topical Once [name], [place]   16.1096 inch at 05/25/11 0803   Medications Prior to Admission  Medication Sig Dispense Refill  . acetaminophen (TYLENOL) 500 MG tablet Take 1,000 mg by mouth every 6 (six) hours as needed. For pain       . amLODipine (NORVASC) 10 MG tablet Take 10 mg by mouth at bedtime.       Marland Kitchen aspirin 81 MG EC tablet Take 81 mg by mouth daily.        Marland Kitchen atorvastatin (LIPITOR) 40 MG tablet Take 1 tablet (40 mg total) by mouth daily. To replace Simvastatin  30 tablet  12  . cloNIDine (CATAPRES) 0.1 MG tablet TAKE ONE  TABLET BY MOUTH TWICE DAILY  60 tablet  2  . lisinopril (PRINIVIL,ZESTRIL) 20 MG tablet TAKE ONE TABLET BY MOUTH TWICE DAILY  60 tablet  2  . metoprolol (LOPRESSOR) 50 MG tablet TAKE ONE TABLET BY MOUTH TWICE DAILY  60 tablet  2  . omeprazole (PRILOSEC) 20 MG capsule Take 20 mg by mouth 2 (two) times daily.       Marland Kitchen warfarin (COUMADIN) 5 MG tablet             EAV:[id number] from the symptoms mentioned above,there are no other symptoms referable to all systems reviewed.  Physical Exam: Blood pressure 143/46, pulse 66, temperature 98.7 F (37.1 C), temperature source Oral, resp. rate 28, height 5' 6.5" (1.689 m), weight 74.39 kg (164 lb), SpO2 94.00%. He is awake and alert and does not appear to be in any acute distress although he was hypoxic when he came to the emergency room. His neck is supple without masses. He does have some due to venous distention while he is sitting upright. His pupils are reactive his mucous membranes are moist. His chest shows fine rales in the bases bilaterally. His heart shows prosthetic heart valve sounds but I do not hear a definite gallop. His abdomen is soft protuberant without masses bowel sounds present and active. His extremities showed trace to 1+ edema bilaterally. His central nervous system examination is grossly intact    Basename 05/25/11 0657  WBC 4.6  NEUTROABS 3.4  HGB 9.7*  HCT 29.0*  MCV 88.4  PLT 306    Basename 05/25/11 0657  NA 133*  K 3.4*  CL 95*  CO2 26  GLUCOSE 178*  BUN 19  CREATININE 0.84  CALCIUM 8.2*  MG --  lablast2(ast:2,ALT:2,alkphos:2,bilitot:2,prot:2,albumin:2)@ No results found for this basename: LIPASE:2,AMYLASE:2 in the last 72 hours No results found for this basename: AMMONIA:2 in the last 72 hourss:2)@  No results found for this or any previous visit (from the past 240 hour(s)).   Dg Chest Portable 1 View  05/25/2011  *RADIOLOGY REPORT*  Clinical Data: Shortness of breath.  PORTABLE CHEST - 1 VIEW  Comparison:  03/13/2005 radiographs.  Findings: 0735 hours. There is stable cardiomegaly status post CABG, AVR and MVR.  Hilar prominence appears unchanged.  There is mild vascular congestion without overt pulmonary edema.  There is no confluent airspace opacity.  A small amount of pleural fluid bilaterally is likely.  Multiple telemetry leads overlie the chest.  IMPRESSION: Stable cardiomegaly with chronic vascular congestion.  Possible small bilateral pleural effusions.  Original Report Authenticated By: Gerrianne Scale, M.D.   Impression: He has congestive heart failure. He has not had an echocardiogram and sometimes was going to have that  he's going to be treated with IV Lasix and he will be in the step down unit. He has multiple other medical problems. Active Problems:  * No active hospital problems. *      Plan: As above      Huntley Knoop L 05/25/2011, 9:23 AM

## 2011-05-25 NOTE — Progress Notes (Signed)
*  PRELIMINARY RESULTS* Echocardiogram 2D Echocardiogram has been performed.  [name] 05/25/2011, 2:31 PM

## 2011-05-26 DIAGNOSIS — I5031 Acute diastolic (congestive) heart failure: Secondary | ICD-10-CM | POA: Diagnosis present

## 2011-05-26 LAB — CBC
HCT: 32 % — ABNORMAL LOW (ref 39.0–52.0)
MCHC: 32.5 g/dL (ref 30.0–36.0)
Platelets: 335 10*3/uL (ref 150–400)
RDW: 14.3 % (ref 11.5–15.5)

## 2011-05-26 LAB — PROTIME-INR: INR: 2.62 — ABNORMAL HIGH (ref 0.00–1.49)

## 2011-05-26 LAB — URINE CULTURE: Colony Count: 3000

## 2011-05-26 LAB — BASIC METABOLIC PANEL
BUN: 16 mg/dL (ref 6–23)
Calcium: 8.7 mg/dL (ref 8.4–10.5)
Chloride: 92 mEq/L — ABNORMAL LOW (ref 96–112)
Creatinine, Ser: 0.96 mg/dL (ref 0.50–1.35)
GFR calc Af Amer: >90 mL/min (ref >90)
GFR calc non Af Amer: 85 mL/min — ABNORMAL LOW (ref >90)

## 2011-05-26 MED ORDER — POTASSIUM CHLORIDE CRYS ER 20 MEQ PO TBCR: 20.0000 meq | EXTENDED_RELEASE_TABLET | Freq: Two times a day (BID) | ORAL | Status: DC

## 2011-05-26 MED ORDER — FUROSEMIDE 40 MG PO TABS: 40.0000 mg | ORAL_TABLET | Freq: Two times a day (BID) | ORAL | Status: DC

## 2011-05-26 MED ORDER — WARFARIN SODIUM 6 MG PO TABS: 6.0000 mg | ORAL_TABLET | Freq: Once | ORAL | Status: DC

## 2011-05-26 MED ORDER — ALBUTEROL SULFATE (2.5 MG/3ML) 0.083% IN NEBU: 2.5000 mg | INHALATION_SOLUTION | Freq: Four times a day (QID) | RESPIRATORY_TRACT | Status: DC | PRN

## 2011-05-26 NOTE — Progress Notes (Signed)
05/26/11 1020 Sascha Baugher RN BSN Pt admited w/ CHF. Discharge home with HH/RN for CHF additonal instructions.

## 2011-05-26 NOTE — Discharge Summary (Signed)
Physician Discharge Summary  Patient ID: [name]. MRN: [id number] DOB/AGE: 12-08-1945 65 y.o. Primary Care Physician:Khadeeja Elden L, MD Admit date: 05/25/2011 Discharge date: 05/26/2011    Discharge Diagnoses:   Principal Problem:  *Acute diastolic congestive heart failure Active Problems:  HYPERLIPIDEMIA  ANEMIA  CHRONIC OBSTRUCTIVE PULMONARY DISEASE  MITRAL VALVE REPLACEMENT, HX OF  AORTIC VALVE REPLACEMENT, HX OF  Chronic anticoagulation  Arteriosclerotic cardiovascular disease (ASCVD)   Current Discharge Medication List    START taking these medications   Details  albuterol (PROVENTIL) (2.5 MG/3ML) 0.083% nebulizer solution Take 3 mLs (2.5 mg total) by nebulization every 6 (six) hours as needed for wheezing. Qty: 75 mL, Refills: 12    !! furosemide (LASIX) 40 MG tablet Take 1 tablet (40 mg total) by mouth 2 (two) times daily. Qty: 60 tablet, Refills: 5    !! furosemide (LASIX) 40 MG tablet Take 1 tablet (40 mg total) by mouth 2 (two) times daily. Qty: 60 tablet, Refills: 5    potassium chloride SA (K-DUR,KLOR-CON) 20 MEQ tablet Take 1 tablet (20 mEq total) by mouth 2 (two) times daily. Qty: 60 tablet, Refills: 5     !! - Potential duplicate medications found. Please discuss with provider.    CONTINUE these medications which have NOT CHANGED   Details  acetaminophen (TYLENOL) 500 MG tablet Take 1,000 mg by mouth every 6 (six) hours as needed. For pain     amLODipine (NORVASC) 10 MG tablet Take 10 mg by mouth at bedtime.     aspirin 81 MG EC tablet Take 81 mg by mouth daily.      atorvastatin (LIPITOR) 40 MG tablet Take 1 tablet (40 mg total) by mouth daily. To replace Simvastatin Qty: 30 tablet, Refills: 12    cloNIDine (CATAPRES) 0.1 MG tablet TAKE ONE TABLET BY MOUTH TWICE DAILY Qty: 60 tablet, Refills: 2    lisinopril (PRINIVIL,ZESTRIL) 20 MG tablet TAKE ONE TABLET BY MOUTH TWICE DAILY Qty: 60 tablet, Refills: 2    metoprolol (LOPRESSOR) 50  MG tablet TAKE ONE TABLET BY MOUTH TWICE DAILY Qty: 60 tablet, Refills: 2    omeprazole (PRILOSEC) 20 MG capsule Take 20 mg by mouth 2 (two) times daily.     simvastatin (ZOCOR) 20 MG tablet Take 20 mg by mouth at bedtime. Patient has about 20 tabs remaining and this med will be replaced with the Lipitor     warfarin (COUMADIN) 5 MG tablet Take 5 mg by mouth at bedtime. Patient takes 1 tab on Sunday, Tuesday, and Thursday Patient takes 1.5 tabs on Monday, Wednesday, Friday, and Saturdays         Discharged Condition: Improved    Consults: None  Significant Diagnostic Studies: Dg Chest Portable 1 View  05/25/2011  *RADIOLOGY REPORT*  Clinical Data: Shortness of breath.  PORTABLE CHEST - 1 VIEW  Comparison: 03/13/2005 radiographs.  Findings: 0735 hours. There is stable cardiomegaly status post CABG, AVR and MVR.  Hilar prominence appears unchanged.  There is mild vascular congestion without overt pulmonary edema.  There is no confluent airspace opacity.  A small amount of pleural fluid bilaterally is likely.  Multiple telemetry leads overlie the chest.  IMPRESSION: Stable cardiomegaly with chronic vascular congestion.  Possible small bilateral pleural effusions.  Original Report Authenticated By: Gerrianne Scale, M.D.    Lab Results: Basic Metabolic Panel:  Basename 05/26/11 0443 05/25/11 0657  NA 134* 133*  K 3.5 3.4*  CL 92* 95*  CO2 33* 26  GLUCOSE 102*  178*  BUN 16 19  CREATININE 0.96 0.84  CALCIUM 8.7 8.2*  MG -- --  PHOS -- --   Liver Function Tests: No results found for this basename: AST:2,ALT:2,ALKPHOS:2,BILITOT:2,PROT:2,ALBUMIN:2 in the last 72 hours No results found for this basename: LIPASE:2,AMYLASE:2 in the last 72 hours No results found for this basename: AMMONIA:2 in the last 72 hours CBC:  Basename 05/26/11 0443 05/25/11 0657  WBC 6.0 4.6  NEUTROABS -- 3.4  HGB 10.4* 9.7*  HCT 32.0* 29.0*  MCV 89.1 88.4  PLT 335 306    Recent Results (from the  past 240 hour(s))  MRSA PCR SCREENING     Status: Normal   Collection Time   05/25/11 12:16 PM      Component Value Range Status Comment   MRSA by PCR NEGATIVE  NEGATIVE  Final      Hospital Course: He came to the emergency with increased shortness of breath and swelling of his legs. He had some vascular redistribution on his chest x-ray and was felt to have acute diastolic congestive heart failure. He was given Lasix in the emergency room and improved but was not ready for discharge. He was brought in to the step down unit begun on intravenous Lasix and showed marked improvement over the next 24 hours to the point that he was ready for discharge after 24-hour stay. He had echocardiogram during this stay which showed that his ejection fraction was around 60%. He is back at baseline  Discharge Exam: Blood pressure 153/60, pulse 72, temperature 98 F (36.7 C), temperature source Oral, resp. rate 22, height 5' 6.5" (1.689 m), weight 72.6 kg (160 lb 0.9 oz), SpO2 100.00%. His chest is clear. He has prosthetic heart valve sounds. He has no edema now.   Disposition: Discharge home. He will be on 40 mg of Lasix twice a day. I gave him a prescription for albuterol for nebulizer. He will followup in the Coumadin clinic. He needs followup appoint with his cardiologist. He will remain on simvastatin until he finishes his current supply and then switched to atorvastatin because he is on amlodipine  Discharge Orders    Future Appointments: Provider: Department: Dept Phone: Center:   05/27/2011 8:30 AM Louanna Raw, RN [facility] [phone] [id number]        Signed: Saavi Mceachron L 05/26/2011, 7:54 AM

## 2011-05-26 NOTE — Consult Note (Signed)
ANTICOAGULATION CONSULT NOTE - follow up  Pharmacy Consult for Warfarin Indication: mechanical valve  No Known Allergies  Patient Measurements: Height: 5' 6.5" (168.9 cm) Weight: 160 lb 0.9 oz (72.6 kg) IBW/kg (Calculated) : 64.95   Vital Signs: Temp: 98 F (36.7 C) (11/20 0400) Temp src: Oral (11/20 0400) BP: 153/60 mmHg (11/20 0722) Pulse Rate: 72  (11/20 0722)  Labs:  Basename 05/26/11 0443 05/25/11 0732 05/25/11 0657  HGB 10.4* -- 9.7*  HCT 32.0* -- 29.0*  PLT 335 -- 306  APTT -- -- --  LABPROT 28.4* 37.2* --  INR 2.62* 3.69* --  HEPARINUNFRC -- -- --  CREATININE 0.96 -- 0.84  CKTOTAL -- -- --  CKMB -- -- --  TROPONINI -- -- --   Estimated Creatinine Clearance: 70.5 ml/min (by C-G formula based on Cr of 0.96).  Medical History: Past Medical History  Diagnosis Date  . Hypertension   . Arteriosclerotic cardiovascular disease (ASCVD) 2003    CABG/MVR/AVR-2003 (#25 St. Jude/#21 St. Jude); anticoagulation; negative stress nuclear-2005  . Epistaxis     requiring catery & aterial ligation-09/2009  . GERD (gastroesophageal reflux disease)   . Hyperlipemia   . Abnormal LFTs     possible cirrhosis  . Chronic anticoagulation   . Valvular heart disease     S/P AVR/MVR  . Tobacco abuse     45 pack years  . Fasting hyperglycemia   . Nephrolithiasis   . 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 Diverticulosis   . History of peptic ulcer disease     Remote  . Hyponatremia    Medications:  Prescriptions prior to admission  Medication Sig Dispense Refill  . acetaminophen (TYLENOL) 500 MG tablet Take 1,000 mg by mouth every 6 (six) hours as needed. For pain       . amLODipine (NORVASC) 10 MG tablet Take 10 mg by mouth at bedtime.       Marland Kitchen aspirin 81 MG EC tablet Take 81 mg by mouth daily.        Marland Kitchen atorvastatin (LIPITOR) 40 MG tablet Take 1 tablet (40 mg total) by mouth daily. To replace Simvastatin  30 tablet  12  . cloNIDine (CATAPRES) 0.1 MG tablet TAKE ONE TABLET BY  MOUTH TWICE DAILY  60 tablet  2  . lisinopril (PRINIVIL,ZESTRIL) 20 MG tablet TAKE ONE TABLET BY MOUTH TWICE DAILY  60 tablet  2  . metoprolol (LOPRESSOR) 50 MG tablet TAKE ONE TABLET BY MOUTH TWICE DAILY  60 tablet  2  . omeprazole (PRILOSEC) 20 MG capsule Take 20 mg by mouth 2 (two) times daily.       . simvastatin (ZOCOR) 20 MG tablet Take 20 mg by mouth at bedtime. Patient has about 20 tabs remaining and this med will be replaced with the Lipitor       . warfarin (COUMADIN) 5 MG tablet Take 5 mg by mouth at bedtime. Patient takes 1 tab on Sunday, Tuesday, and Thursday Patient takes 1.5 tabs on Monday, Wednesday, Friday, and Saturdays       . warfarin (COUMADIN) 5 MG tablet         Assessment: INR therapeutic  PTA dose noted  Goal of Therapy: INR 2.5 - 3.5 for valve   Plan: Coumadin 6mg  today x 1 INR daily until stable  Lesly Pontarelli A 05/26/2011,7:42 AM

## 2011-05-26 NOTE — Progress Notes (Signed)
Patient d/c home with family  [facility] following patient Left floor walking accompanied by staff Patient verbalized understanding of d/c instructions, follow up appt, and new RX's [name], [employer]

## 2011-05-26 NOTE — Progress Notes (Signed)
Subjective: He looks much better. He has no complaints. He says he is back to normal. He wants to go home. His echocardiogram showed his ejection fraction was in the 60s  Objective: Vital signs in last 24 hours: Temp:  [98 F (36.7 C)-98.1 F (36.7 C)] 98 F (36.7 C) (11/20 0400) Pulse Rate:  [64-80] 72  (11/20 0722) Resp:  [15-26] 22  (11/20 0722) BP: (129-158)/(46-79) 153/60 mmHg (11/20 0722) SpO2:  [88 %-100 %] 100 % (11/20 0722) Weight:  [72.6 kg (160 lb 0.9 oz)] 160 lb 0.9 oz (72.6 kg) (11/20 0300) Weight change: -1.79 kg (-3 lb 15.1 oz) Last BM Date: 05/25/11  Intake/Output from previous day: 11/19 0701 - 11/20 0700 In: 1355 [P.O.:240; I.V.:1115] Out: 3850 [Urine:3850]  PHYSICAL EXAM General appearance: alert, cooperative and no distress Resp: clear to auscultation bilaterally Cardio: He has prosthetic heart valve sound GI: soft, non-tender; bowel sounds normal; no masses,  no organomegaly Extremities: extremities normal, atraumatic, no cyanosis or edema  Lab Results:    Basic Metabolic Panel:  Basename 05/26/11 0443 05/25/11 0657  NA 134* 133*  K 3.5 3.4*  CL 92* 95*  CO2 33* 26  GLUCOSE 102* 178*  BUN 16 19  CREATININE 0.96 0.84  CALCIUM 8.7 8.2*  MG -- --  PHOS -- --   Liver Function Tests: No results found for this basename: AST:2,ALT:2,ALKPHOS:2,BILITOT:2,PROT:2,ALBUMIN:2 in the last 72 hours No results found for this basename: LIPASE:2,AMYLASE:2 in the last 72 hours No results found for this basename: AMMONIA:2 in the last 72 hours CBC:  Basename 05/26/11 0443 05/25/11 0657  WBC 6.0 4.6  NEUTROABS -- 3.4  HGB 10.4* 9.7*  HCT 32.0* 29.0*  MCV 89.1 88.4  PLT 335 306   Cardiac Enzymes: No results found for this basename: CKTOTAL:3,CKMB:3,CKMBINDEX:3,TROPONINI:3 in the last 72 hours BNP:  Basename 05/25/11 0732  POCBNP 4112.0*   D-Dimer: No results found for this basename: DDIMER:2 in the last 72 hours CBG: No results found for this  basename: GLUCAP:6 in the last 72 hours Hemoglobin A1C: No results found for this basename: HGBA1C in the last 72 hours Fasting Lipid Panel: No results found for this basename: CHOL,HDL,LDLCALC,TRIG,CHOLHDL,LDLDIRECT in the last 72 hours Thyroid Function Tests: No results found for this basename: TSH,T4TOTAL,FREET4,T3FREE,THYROIDAB in the last 72 hours Anemia Panel: No results found for this basename: VITAMINB12,FOLATE,FERRITIN,TIBC,IRON,RETICCTPCT in the last 72 hours Coagulation:  Basename 05/26/11 0443 05/25/11 0732  LABPROT 28.4* 37.2*  INR 2.62* 3.69*   Urine Drug Screen:  Alcohol Level: No results found for this basename: ETH:2 in the last 72 hours Urinalysis:  Misc. Labs:  ABGS No results found for this basename: PHART,PCO2,PO2ART,TCO2,HCO3 in the last 72 hours CULTURES Recent Results (from the past 240 hour(s))  MRSA PCR SCREENING     Status: Normal   Collection Time   05/25/11 12:16 PM      Component Value Range Status Comment   MRSA by PCR NEGATIVE  NEGATIVE  Final    Studies/Results: Dg Chest Portable 1 View  05/25/2011  *RADIOLOGY REPORT*  Clinical Data: Shortness of breath.  PORTABLE CHEST - 1 VIEW  Comparison: 03/13/2005 radiographs.  Findings: 0735 hours. There is stable cardiomegaly status post CABG, AVR and MVR.  Hilar prominence appears unchanged.  There is mild vascular congestion without overt pulmonary edema.  There is no confluent airspace opacity.  A small amount of pleural fluid bilaterally is likely.  Multiple telemetry leads overlie the chest.  IMPRESSION: Stable cardiomegaly with chronic vascular congestion.  Possible small bilateral pleural effusions.  Original Report Authenticated By: Gerrianne Scale, M.D.    Medications:  Prior to Admission:  Prescriptions prior to admission  Medication Sig Dispense Refill  . acetaminophen (TYLENOL) 500 MG tablet Take 1,000 mg by mouth every 6 (six) hours as needed. For pain       . amLODipine (NORVASC) 10  MG tablet Take 10 mg by mouth at bedtime.       Marland Kitchen aspirin 81 MG EC tablet Take 81 mg by mouth daily.        Marland Kitchen atorvastatin (LIPITOR) 40 MG tablet Take 1 tablet (40 mg total) by mouth daily. To replace Simvastatin  30 tablet  12  . cloNIDine (CATAPRES) 0.1 MG tablet TAKE ONE TABLET BY MOUTH TWICE DAILY  60 tablet  2  . lisinopril (PRINIVIL,ZESTRIL) 20 MG tablet TAKE ONE TABLET BY MOUTH TWICE DAILY  60 tablet  2  . metoprolol (LOPRESSOR) 50 MG tablet TAKE ONE TABLET BY MOUTH TWICE DAILY  60 tablet  2  . omeprazole (PRILOSEC) 20 MG capsule Take 20 mg by mouth 2 (two) times daily.       . simvastatin (ZOCOR) 20 MG tablet Take 20 mg by mouth at bedtime. Patient has about 20 tabs remaining and this med will be replaced with the Lipitor       . warfarin (COUMADIN) 5 MG tablet Take 5 mg by mouth at bedtime. Patient takes 1 tab on Sunday, Tuesday, and Thursday Patient takes 1.5 tabs on Monday, Wednesday, Friday, and Saturdays       . warfarin (COUMADIN) 5 MG tablet         Scheduled:   . albuterol  5 mg Nebulization Once  . amLODipine  10 mg Oral QHS  . aspirin EC  81 mg Oral Daily  . cloNIDine  0.1 mg Oral BID  . furosemide  40 mg Intravenous Once  . furosemide  40 mg Intravenous BID  . ipratropium  0.5 mg Nebulization Once  . lisinopril  10 mg Oral Daily  . metoprolol  50 mg Oral BID  . nitroGLYCERIN  1 inch Topical Once  . pantoprazole  40 mg Oral Q1200  . potassium chloride  20 mEq Oral BID  . simvastatin  20 mg Oral QHS  . sodium chloride  3 mL Intravenous Q12H  . warfarin  1 mg Oral ONCE-1800  . warfarin  6 mg Oral ONCE-1800  . DISCONTD: enoxaparin  40 mg Subcutaneous Q24H  . DISCONTD: warfarin  5 mg Oral QHS   Continuous:   . sodium chloride 50 mL/hr at 05/26/11 0600  . sodium chloride     ZOX:[id number], acetaminophen, alum & mag hydroxide-simeth, HYDROcodone-acetaminophen, ondansetron (ZOFRAN) IV, ondansetron, senna, traZODone, DISCONTD: fentaNYL, DISCONTD: fentaNYL,  DISCONTD: ondansetron (ZOFRAN) IV, DISCONTD: ondansetron (ZOFRAN) IV  Assesment: He was admitted with CHF is much improved. I think he's okay to go home. He will need followup with his cardiologist. Active Problems:  * No active hospital problems. *     Plan: Discharge home    LOS: 1 day   Jacquese Hackman L 05/26/2011, 7:41 AM

## 2011-05-27 ENCOUNTER — Ambulatory Visit (INDEPENDENT_AMBULATORY_CARE_PROVIDER_SITE_OTHER): Payer: Self-pay | Admitting: *Deleted

## 2011-05-27 ENCOUNTER — Telehealth: Payer: Self-pay | Admitting: Cardiology

## 2011-05-27 ENCOUNTER — Encounter: Payer: Medicare Other | Admitting: *Deleted

## 2011-05-27 DIAGNOSIS — Z7901 Long term (current) use of anticoagulants: Secondary | ICD-10-CM

## 2011-05-27 DIAGNOSIS — I359 Nonrheumatic aortic valve disorder, unspecified: Secondary | ICD-10-CM

## 2011-05-27 DIAGNOSIS — Z9889 Other specified postprocedural states: Secondary | ICD-10-CM

## 2011-05-27 DIAGNOSIS — R0989 Other specified symptoms and signs involving the circulatory and respiratory systems: Secondary | ICD-10-CM

## 2011-05-27 DIAGNOSIS — I059 Rheumatic mitral valve disease, unspecified: Secondary | ICD-10-CM

## 2011-05-27 DIAGNOSIS — Z954 Presence of other heart-valve replacement: Secondary | ICD-10-CM

## 2011-05-27 NOTE — Telephone Encounter (Signed)
INR - 2.8 / please call Angelique Blonder with instructions/tg

## 2011-06-03 ENCOUNTER — Ambulatory Visit (INDEPENDENT_AMBULATORY_CARE_PROVIDER_SITE_OTHER): Payer: Self-pay | Admitting: *Deleted

## 2011-06-03 DIAGNOSIS — Z9889 Other specified postprocedural states: Secondary | ICD-10-CM

## 2011-06-03 DIAGNOSIS — R0989 Other specified symptoms and signs involving the circulatory and respiratory systems: Secondary | ICD-10-CM

## 2011-06-03 DIAGNOSIS — I359 Nonrheumatic aortic valve disorder, unspecified: Secondary | ICD-10-CM

## 2011-06-03 DIAGNOSIS — Z7901 Long term (current) use of anticoagulants: Secondary | ICD-10-CM

## 2011-06-03 DIAGNOSIS — Z954 Presence of other heart-valve replacement: Secondary | ICD-10-CM

## 2011-06-03 DIAGNOSIS — I059 Rheumatic mitral valve disease, unspecified: Secondary | ICD-10-CM

## 2011-06-03 LAB — POCT INR: INR: 3.3

## 2011-06-10 ENCOUNTER — Telehealth: Payer: Self-pay | Admitting: Cardiology

## 2011-06-10 ENCOUNTER — Ambulatory Visit (INDEPENDENT_AMBULATORY_CARE_PROVIDER_SITE_OTHER): Payer: Self-pay | Admitting: Cardiology

## 2011-06-10 DIAGNOSIS — I359 Nonrheumatic aortic valve disorder, unspecified: Secondary | ICD-10-CM

## 2011-06-10 DIAGNOSIS — R0989 Other specified symptoms and signs involving the circulatory and respiratory systems: Secondary | ICD-10-CM

## 2011-06-10 DIAGNOSIS — Z9889 Other specified postprocedural states: Secondary | ICD-10-CM

## 2011-06-10 DIAGNOSIS — Z7901 Long term (current) use of anticoagulants: Secondary | ICD-10-CM

## 2011-06-10 DIAGNOSIS — Z954 Presence of other heart-valve replacement: Secondary | ICD-10-CM

## 2011-06-10 DIAGNOSIS — I059 Rheumatic mitral valve disease, unspecified: Secondary | ICD-10-CM

## 2011-06-10 LAB — POCT INR: INR: 3.57

## 2011-06-10 NOTE — Telephone Encounter (Signed)
PT FINGER STICK WAS 4.4. VENIPUNCTURE WAS TAKEN AND SENT OFF TO LAB. RESULTS ARE INR 3.57 AND PT 36.2. PLEASE CALL TO ADVISE.

## 2011-06-16 ENCOUNTER — Other Ambulatory Visit: Payer: Self-pay | Admitting: Cardiology

## 2011-06-18 ENCOUNTER — Other Ambulatory Visit: Payer: Self-pay | Admitting: Cardiology

## 2011-06-24 ENCOUNTER — Ambulatory Visit (INDEPENDENT_AMBULATORY_CARE_PROVIDER_SITE_OTHER): Payer: Self-pay | Admitting: *Deleted

## 2011-06-24 ENCOUNTER — Telehealth: Payer: Self-pay | Admitting: *Deleted

## 2011-06-24 DIAGNOSIS — Z7901 Long term (current) use of anticoagulants: Secondary | ICD-10-CM

## 2011-06-24 DIAGNOSIS — R0989 Other specified symptoms and signs involving the circulatory and respiratory systems: Secondary | ICD-10-CM

## 2011-06-24 DIAGNOSIS — Z954 Presence of other heart-valve replacement: Secondary | ICD-10-CM

## 2011-06-24 DIAGNOSIS — I059 Rheumatic mitral valve disease, unspecified: Secondary | ICD-10-CM

## 2011-06-24 DIAGNOSIS — Z9889 Other specified postprocedural states: Secondary | ICD-10-CM

## 2011-06-24 DIAGNOSIS — I359 Nonrheumatic aortic valve disorder, unspecified: Secondary | ICD-10-CM

## 2011-06-24 LAB — PROTIME-INR: INR: 3.9 — AB (ref 0.9–1.1)

## 2011-06-24 NOTE — Telephone Encounter (Signed)
[name] with [facility] called reporting fingerstick 4.7 yesterday.  Per Venipuncture:  PT 3.89/INR 38.7.  Please call at [phone].

## 2011-06-24 NOTE — Telephone Encounter (Signed)
See coumadin note. 

## 2011-06-24 NOTE — Telephone Encounter (Signed)
Patient INR was 4.7, she did venipuncture and is at [facility] now getting tested.

## 2011-07-01 ENCOUNTER — Ambulatory Visit (INDEPENDENT_AMBULATORY_CARE_PROVIDER_SITE_OTHER): Payer: Self-pay | Admitting: *Deleted

## 2011-07-01 DIAGNOSIS — Z7901 Long term (current) use of anticoagulants: Secondary | ICD-10-CM

## 2011-07-01 DIAGNOSIS — I359 Nonrheumatic aortic valve disorder, unspecified: Secondary | ICD-10-CM

## 2011-07-01 DIAGNOSIS — Z9889 Other specified postprocedural states: Secondary | ICD-10-CM

## 2011-07-01 DIAGNOSIS — Z954 Presence of other heart-valve replacement: Secondary | ICD-10-CM

## 2011-07-01 DIAGNOSIS — R0989 Other specified symptoms and signs involving the circulatory and respiratory systems: Secondary | ICD-10-CM

## 2011-07-01 DIAGNOSIS — I059 Rheumatic mitral valve disease, unspecified: Secondary | ICD-10-CM

## 2011-07-13 ENCOUNTER — Telehealth: Payer: Self-pay | Admitting: Cardiology

## 2011-07-13 MED ORDER — ATORVASTATIN CALCIUM 40 MG PO TABS: 40.0000 mg | ORAL_TABLET | Freq: Every day | ORAL | Status: DC

## 2011-07-13 NOTE — Telephone Encounter (Signed)
Patient states that Dr. Dietrich Pates wanted to switch him to generic Lipitor.  Please call this in to [facility] pharmacy.  / tg

## 2011-07-22 ENCOUNTER — Ambulatory Visit (INDEPENDENT_AMBULATORY_CARE_PROVIDER_SITE_OTHER): Payer: Self-pay | Admitting: *Deleted

## 2011-07-22 DIAGNOSIS — Z7901 Long term (current) use of anticoagulants: Secondary | ICD-10-CM

## 2011-07-22 DIAGNOSIS — Z9889 Other specified postprocedural states: Secondary | ICD-10-CM

## 2011-07-22 DIAGNOSIS — R0989 Other specified symptoms and signs involving the circulatory and respiratory systems: Secondary | ICD-10-CM

## 2011-07-22 DIAGNOSIS — Z954 Presence of other heart-valve replacement: Secondary | ICD-10-CM

## 2011-07-22 LAB — POCT INR: INR: 3.1

## 2011-08-03 ENCOUNTER — Telehealth: Payer: Self-pay

## 2011-08-03 NOTE — Telephone Encounter (Signed)
Pt referred from Dr. Juanetta Gosling for colonoscopy. Has numerous medical problems. OV with Tana Coast, PA on 08/10/2011 @ 8:30 AM.

## 2011-08-06 ENCOUNTER — Encounter: Payer: Self-pay | Admitting: Internal Medicine

## 2011-08-10 ENCOUNTER — Telehealth: Payer: Self-pay | Admitting: *Deleted

## 2011-08-10 ENCOUNTER — Telehealth: Payer: Self-pay | Admitting: Gastroenterology

## 2011-08-10 ENCOUNTER — Encounter: Payer: Self-pay | Admitting: Gastroenterology

## 2011-08-10 ENCOUNTER — Ambulatory Visit (INDEPENDENT_AMBULATORY_CARE_PROVIDER_SITE_OTHER): Payer: Medicare Other | Admitting: Gastroenterology

## 2011-08-10 VITALS — BP 80/49 | HR 73 | Temp 98.2°F | Ht 66.0 in | Wt 162.4 lb

## 2011-08-10 DIAGNOSIS — D649 Anemia, unspecified: Secondary | ICD-10-CM

## 2011-08-10 DIAGNOSIS — R945 Abnormal results of liver function studies: Secondary | ICD-10-CM

## 2011-08-10 DIAGNOSIS — Z7901 Long term (current) use of anticoagulants: Secondary | ICD-10-CM

## 2011-08-10 DIAGNOSIS — Z1211 Encounter for screening for malignant neoplasm of colon: Secondary | ICD-10-CM

## 2011-08-10 DIAGNOSIS — R7989 Other specified abnormal findings of blood chemistry: Secondary | ICD-10-CM

## 2011-08-10 NOTE — Progress Notes (Signed)
Primary Care Physician:  Fredirick Maudlin, MD, MD  Primary Gastroenterologist:  Roetta Sessions, MD   Chief Complaint  Patient presents with  . Colonoscopy    HPI:  [name] Schumpert. is a 66 y.o. male here to schedule colonoscopy. His last one was 10 years ago. He has h/o anemia, likely multifactorial. 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  Last TCS 2003, diverticulosis. EGD 2003, esophageal leiomyoma. Seen again in 2006 for anemia, multiple heme negative stools. LDH elevated, Haptoglobin low c/w hemolytic anemia.   Patient denies constipation, diarrhea, melena, brbpr, abd pain, heartburn, dysphagia, weight loss.  He as h/o chronically mildly elevated alkaline phosphatase dating back nearly 10 years. Abd u/s in 2007 unremarkable. Consumes significant etoh daily. I could not find where AMA ever checked for PBC.    Heme negative X 3 in 05/2011 at Dr. Marvel Plan office.    Current Outpatient Prescriptions  Medication Sig Dispense Refill  . acetaminophen (TYLENOL) 500 MG tablet Take 1,000 mg by mouth every 6 (six) hours as needed. For pain       . albuterol (PROVENTIL) (2.5 MG/3ML) 0.083% nebulizer solution Take 3 mLs (2.5 mg total) by nebulization every 6 (six) hours as needed for wheezing.  75 mL  12  . amLODipine (NORVASC) 10 MG tablet Take 10 mg by mouth at bedtime.       Marland Kitchen aspirin 81 MG EC tablet Take 81 mg by mouth daily.        Marland Kitchen atorvastatin (LIPITOR) 40 MG tablet Take 1 tablet (40 mg total) by mouth daily. To replace Simvastatin  30 tablet  12  . cloNIDine (CATAPRES) 0.1 MG tablet TAKE ONE TABLET BY MOUTH TWICE DAILY  180 tablet  3  . furosemide (LASIX) 40 MG tablet Take 1 tablet (40 mg total) by mouth 2 (two) times daily.  60 tablet  5  . lisinopril (PRINIVIL,ZESTRIL) 20 MG tablet TAKE ONE TABLET BY MOUTH TWICE DAILY  180 tablet  3  . metoprolol (LOPRESSOR) 50 MG tablet TAKE ONE TABLET BY MOUTH TWICE DAILY  90 tablet  3  .  omeprazole (PRILOSEC) 20 MG capsule Take 20 mg by mouth 2 (two) times daily.       . potassium chloride SA (K-DUR,KLOR-CON) 20 MEQ tablet Take 1 tablet (20 mEq total) by mouth 2 (two) times daily.  60 tablet  5  . warfarin (COUMADIN) 5 MG tablet Take 5 mg by mouth at bedtime. Patient takes 1 tab on Sunday, Tuesday, and Thursday Patient takes 1.5 tabs on Monday, Wednesday, Friday, and Saturdays         Allergies as of 08/10/2011  . (No Known Allergies)    Past Medical History  Diagnosis Date  . Hypertension   . Arteriosclerotic cardiovascular disease (ASCVD) 2003    CABG/MVR/AVR-2003 (#25 St. Jude/#21 St. Jude); anticoagulation; negative stress nuclear-2005  . Epistaxis     requiring catery & aterial ligation-09/2009  . GERD (gastroesophageal reflux disease)   . Hyperlipemia   . Abnormal LFTs     possible cirrhosis  . Chronic anticoagulation   . Valvular heart disease     S/P AVR/MVR  . Tobacco abuse     45  pack years  . Fasting hyperglycemia   . Nephrolithiasis   . Anemia     hemolytic anemia  . Diverticulosis   . Hyponatremia   . CHF (congestive heart failure)   . Chronic renal disease   . Hemolytic anemia  history of  . ETOH abuse     Past Surgical History  Procedure Date  . Endocopic sphenopalatine artery ligation & cautry   . Inguinal hernia repair     Left & right  . Coronary artery bypass graft 11/2001    [facility]  . Cardiac valve replacement 11/2001    AVR and MVR-St. Jude devices  . Cataract extraction w/phaco 01/20/2011    Procedure: CATARACT EXTRACTION PHACO AND INTRAOCULAR LENS PLACEMENT (IOC);  Surgeon: Loraine Leriche T. Nile Riggs;  Location: [facility];  Service: Ophthalmology;  Laterality: Right;  CDE: 8.51  . Cataract extraction w/phaco 02/03/2011    Procedure: CATARACT EXTRACTION PHACO AND INTRAOCULAR LENS PLACEMENT (IOC);  Surgeon: Loraine Leriche T. Nile Riggs;  Location: [facility];  Service: Ophthalmology;  Laterality: Left;  CDE:10.01  . Colonoscopy 04/05/02    friable  anal canal hemorrhoids otherwise normal  . Esophagogastroduodenoscopy 01/2002    Dr. Karilyn Cota, submucosal esophageal lesion c/w leiomyoma    Family History  Problem Relation Age of Onset  . Hypotension Neg Hx   . Anesthesia problems Neg Hx   . Malignant hyperthermia Neg Hx   . Pseudochol deficiency Neg Hx   . Colon cancer Neg Hx   . Liver disease Neg Hx     History   Social History  . Marital Status: Widowed    Spouse Name: N/A    Number of Children: 1  . Years of Education: N/A   Occupational History  . retired    Social History Main Topics  . Smoking status: Current Everyday Smoker -- 1.0 packs/day for 40 years    Types: Cigarettes  . Smokeless tobacco: Not on file  . Alcohol Use: 4.2 oz/week    6 Cans of beer, 1 Shots of liquor per week  . Drug Use: No  . Sexually Active: No   Other Topics Concern  . Not on file   Social History Narrative  . No narrative on file      ROS:  General: Negative for anorexia, weight loss, fever, chills, fatigue, weakness. Eyes: Negative for vision changes.  ENT: Negative for hoarseness, difficulty swallowing , nasal congestion. CV: Negative for chest pain, angina, palpitations, dyspnea on exertion, peripheral edema.  Respiratory: Negative for dyspnea at rest, dyspnea on exertion,. C/O cough, clear sputum. No wheezing.  GI: See history of present illness. GU:  Negative for dysuria, hematuria, urinary incontinence, urinary frequency, nocturnal urination.  MS: Negative for joint pain, low back pain.  Derm: Negative for rash or itching.  Neuro: Negative for weakness, abnormal sensation, seizure, frequent headaches, memory loss, confusion.  Psych: Negative for anxiety, depression, suicidal ideation, hallucinations.  Endo: Negative for unusual weight change.  Heme: Negative for bruising or bleeding. Allergy: Negative for rash or hives.    Physical Examination:  BP 80/49  Pulse 73  Temp(Src) 98.2 F (36.8 C) (Temporal)  Ht 5\' 6"   (1.676 m)  Wt 162 lb 6.4 oz (73.664 kg)  BMI 26.21 kg/m2   General: Well-nourished, well-developed in no acute distress.  Head: Normocephalic, atraumatic.   Eyes: Conjunctiva pink, no icterus. Mouth: Oropharyngeal mucosa moist and pink , no lesions erythema or exudate. Neck: Supple without thyromegaly, masses, or lymphadenopathy.  Lungs: Clear to auscultation bilaterally.  Heart: Regular rate and rhythm, no murmurs rubs or gallops.  Abdomen: Bowel sounds are normal, nontender, nondistended, no hepatosplenomegaly or masses, no abdominal bruits or    hernia , no rebound or guarding.   Rectal: Not performed. Extremities: No lower extremity  edema. No clubbing or deformities.  Neuro: Alert and oriented x 4 , grossly normal neurologically.  Skin: Warm and dry, no rash or jaundice.   Psych: Alert and cooperative, normal mood and affect.  Labs: Lab Results  Component Value Date   WBC 6.0 05/26/2011   HGB 10.4* 05/26/2011   HCT 32.0* 05/26/2011   MCV 89.1 05/26/2011   PLT 335 05/26/2011   Lab Results  Component Value Date   ALT 15 05/18/2011   AST 29 05/18/2011   ALKPHOS 123* 05/18/2011   BILITOT 0.9 05/18/2011   Lab Results  Component Value Date   CREATININE 0.96 05/26/2011   BUN 16 05/26/2011   NA 134* 05/26/2011   K 3.5 05/26/2011   CL 92* 05/26/2011   CO2 33* 05/26/2011

## 2011-08-10 NOTE — Progress Notes (Signed)
Faxed to PCP

## 2011-08-10 NOTE — Progress Notes (Signed)
Please let patient know. I was looking through his records. He needs update abd u/s given h/o abnormal lfts. If any evidence of scarring in the liver then we would offer him EGD at time of TCS.  He also needs some further labs.  Orders are in, please arrange.

## 2011-08-10 NOTE — Assessment & Plan Note (Signed)
Chronic anemia. Heme neg X 3 in 05/2011, Dr. Dietrich Pates. 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 Colonoscopy in near future.  I have discussed the risks, alternatives, benefits with regards to but not limited to the risk of reaction to medication, bleeding, infection, perforation and the patient is agreeable to proceed. Written consent to be obtained.

## 2011-08-10 NOTE — Assessment & Plan Note (Addendum)
Will likely need Lovenox bridge. Sent question to Dr. Marvel Plan nurse, Tomi Cheek. Await response.

## 2011-08-10 NOTE — Telephone Encounter (Signed)
Please advise as to how many days Mr. Rickman can be off of his coumadin for a colonoscopy.

## 2011-08-10 NOTE — Patient Instructions (Signed)
We will call you to schedule a colonoscopy once I have discussed your situation with Dr. Dietrich Pates, regarding your Coumadin.

## 2011-08-10 NOTE — Assessment & Plan Note (Addendum)
Chronically elevated alk phos in setting of daily etoh use. Need to rule out PBC. No cirrhosis on abd u/s 2007. No splenomegaly on exam. Platelet counts are normal. Cannot rule out underlying cirrhosis. Consider updating abd u/s if patient agreeable.   Will also check AMA, GGT.   If patient has cirrhosis, would advise egd to r/o varices, which could be done at time of tcs.

## 2011-08-10 NOTE — Telephone Encounter (Signed)
Request for advise regarding patient sent to Dr. Marvel Plan nurse, Ms. Cheek.  Please answer following questions: Does patient need Lovenox bridge for colonoscopy? If so, is 1.5mg /kg daily ok? Does patient need antibiotics to prevent bacterial endocarditis? For screening GI procedures, we usually do not advise antibiotics to prevent bacterial endocarditis. Please let me know if that is ok with Dr. Dietrich Pates.

## 2011-08-11 NOTE — Progress Notes (Signed)
[place] scheduled for 02/08 @ 10- pt aware to be NPO after midnight

## 2011-08-11 NOTE — Telephone Encounter (Signed)
The patient has mechanical prosthetic mitral and aortic valves.  Accordingly, he cannot stop anticoagulation for any period of time.  Unless gastroenterologist is willing to perform the procedure while he was anticoagulated, he will need to be bridged.

## 2011-08-12 ENCOUNTER — Encounter: Payer: Medicare Other | Admitting: *Deleted

## 2011-08-12 MED ORDER — ENOXAPARIN SODIUM 120 MG/0.8ML ~~LOC~~ SOLN: 110.0000 mg | SUBCUTANEOUS | Status: DC

## 2011-08-12 NOTE — Telephone Encounter (Signed)
See phone note from Dr. Dietrich Pates.   Please schedule patient for colonoscopy with Dr. Jena Gauss. He will need Phenergan 25mg  IV 30 mins before procedures.  He will need to hold his coumadin four days before. He will need to start Lovenox 110mg  [place] every 8am three days before procedure. Continue lovenox 110mg  [place] q 8am. Do not give dose the morning of procedure. Further instructions to follow procedure by Dr. Jena Gauss.  RX for lovenox sent to [facility].

## 2011-08-12 NOTE — Progress Notes (Signed)
Pt aware, he will go by lab on Friday when he goes for U/S. Lab order faxed to lab.

## 2011-08-13 ENCOUNTER — Ambulatory Visit (INDEPENDENT_AMBULATORY_CARE_PROVIDER_SITE_OTHER): Payer: Medicare Other | Admitting: *Deleted

## 2011-08-13 ENCOUNTER — Other Ambulatory Visit: Payer: Self-pay | Admitting: Gastroenterology

## 2011-08-13 DIAGNOSIS — Z954 Presence of other heart-valve replacement: Secondary | ICD-10-CM

## 2011-08-13 DIAGNOSIS — Z7901 Long term (current) use of anticoagulants: Secondary | ICD-10-CM

## 2011-08-13 DIAGNOSIS — I059 Rheumatic mitral valve disease, unspecified: Secondary | ICD-10-CM

## 2011-08-13 DIAGNOSIS — I359 Nonrheumatic aortic valve disorder, unspecified: Secondary | ICD-10-CM

## 2011-08-13 DIAGNOSIS — Z9889 Other specified postprocedural states: Secondary | ICD-10-CM

## 2011-08-13 DIAGNOSIS — Z1211 Encounter for screening for malignant neoplasm of colon: Secondary | ICD-10-CM

## 2011-08-13 MED ORDER — PEG-KCL-NACL-NASULF-NA ASC-C 100 G PO SOLR: 1.0000 | Freq: Once | ORAL | Status: DC

## 2011-08-13 NOTE — Progress Notes (Signed)
Pts scheduled for TCS on 02/27 -

## 2011-08-14 ENCOUNTER — Ambulatory Visit (HOSPITAL_COMMUNITY)
Admission: RE | Admit: 2011-08-14 | Discharge: 2011-08-14 | Disposition: A | Discharge status: Home / Self Care | Payer: Medicare Other | Source: Ambulatory Visit | Attending: Gastroenterology | Admitting: Gastroenterology

## 2011-08-14 DIAGNOSIS — Q619 Cystic kidney disease, unspecified: Secondary | ICD-10-CM | POA: Insufficient documentation

## 2011-08-14 DIAGNOSIS — Z951 Presence of aortocoronary bypass graft: Secondary | ICD-10-CM | POA: Insufficient documentation

## 2011-08-14 DIAGNOSIS — E871 Hypo-osmolality and hyponatremia: Secondary | ICD-10-CM | POA: Insufficient documentation

## 2011-08-14 DIAGNOSIS — I10 Essential (primary) hypertension: Secondary | ICD-10-CM | POA: Insufficient documentation

## 2011-08-14 DIAGNOSIS — R7989 Other specified abnormal findings of blood chemistry: Secondary | ICD-10-CM | POA: Insufficient documentation

## 2011-08-14 DIAGNOSIS — R945 Abnormal results of liver function studies: Secondary | ICD-10-CM

## 2011-08-14 DIAGNOSIS — K7460 Unspecified cirrhosis of liver: Secondary | ICD-10-CM | POA: Insufficient documentation

## 2011-08-17 ENCOUNTER — Ambulatory Visit (INDEPENDENT_AMBULATORY_CARE_PROVIDER_SITE_OTHER): Payer: Medicare Other | Admitting: *Deleted

## 2011-08-17 ENCOUNTER — Other Ambulatory Visit: Payer: Self-pay | Admitting: Cardiology

## 2011-08-17 DIAGNOSIS — Z9889 Other specified postprocedural states: Secondary | ICD-10-CM

## 2011-08-17 DIAGNOSIS — Z954 Presence of other heart-valve replacement: Secondary | ICD-10-CM

## 2011-08-17 DIAGNOSIS — I059 Rheumatic mitral valve disease, unspecified: Secondary | ICD-10-CM

## 2011-08-17 DIAGNOSIS — Z7901 Long term (current) use of anticoagulants: Secondary | ICD-10-CM

## 2011-08-17 DIAGNOSIS — I359 Nonrheumatic aortic valve disorder, unspecified: Secondary | ICD-10-CM

## 2011-08-17 LAB — POCT INR: INR: 2.5

## 2011-08-17 NOTE — Telephone Encounter (Signed)
CD has scheduled pts procedure.

## 2011-08-17 NOTE — Progress Notes (Signed)
Quick Note:  No evidence of cirrhosis. Stable benign renal cysts. Recommend limiting etoh use. Await labs. ______

## 2011-08-19 NOTE — Progress Notes (Signed)
Quick Note:  ABD u/s did not show any significant abnormalities. GGT and AMA normal.  Chronic minimally elevated alk phos could be coming from other source such as bone. Consider discussing with PCP.  Procedure as planned. ______

## 2011-08-24 ENCOUNTER — Ambulatory Visit (INDEPENDENT_AMBULATORY_CARE_PROVIDER_SITE_OTHER): Payer: Medicare Other | Admitting: *Deleted

## 2011-08-24 DIAGNOSIS — Z9889 Other specified postprocedural states: Secondary | ICD-10-CM

## 2011-08-24 DIAGNOSIS — I059 Rheumatic mitral valve disease, unspecified: Secondary | ICD-10-CM

## 2011-08-24 DIAGNOSIS — Z954 Presence of other heart-valve replacement: Secondary | ICD-10-CM

## 2011-08-24 DIAGNOSIS — I359 Nonrheumatic aortic valve disorder, unspecified: Secondary | ICD-10-CM

## 2011-08-24 DIAGNOSIS — Z7901 Long term (current) use of anticoagulants: Secondary | ICD-10-CM

## 2011-08-24 LAB — POCT INR: INR: 3.7

## 2011-08-24 NOTE — Progress Notes (Signed)
Labs & [place] Cc to PCP

## 2011-09-01 MED ORDER — SODIUM CHLORIDE 0.45 % IV SOLN
Freq: Once | INTRAVENOUS | Status: AC
  Administered 2011-09-02: 08:00:00 via INTRAVENOUS

## 2011-09-02 ENCOUNTER — Ambulatory Visit (HOSPITAL_COMMUNITY)
Admission: RE | Admit: 2011-09-02 | Discharge: 2011-09-02 | Disposition: A | Discharge status: Home / Self Care | Payer: Medicare Other | Source: Ambulatory Visit | Attending: Internal Medicine | Admitting: Internal Medicine

## 2011-09-02 ENCOUNTER — Encounter (HOSPITAL_COMMUNITY)
Admission: RE | Disposition: A | Discharge status: Home / Self Care | Payer: Self-pay | Source: Ambulatory Visit | Attending: Internal Medicine

## 2011-09-02 ENCOUNTER — Encounter (HOSPITAL_COMMUNITY): Payer: Self-pay

## 2011-09-02 DIAGNOSIS — K5730 Diverticulosis of large intestine without perforation or abscess without bleeding: Secondary | ICD-10-CM | POA: Insufficient documentation

## 2011-09-02 DIAGNOSIS — I10 Essential (primary) hypertension: Secondary | ICD-10-CM | POA: Insufficient documentation

## 2011-09-02 DIAGNOSIS — Z7982 Long term (current) use of aspirin: Secondary | ICD-10-CM | POA: Insufficient documentation

## 2011-09-02 DIAGNOSIS — Z1211 Encounter for screening for malignant neoplasm of colon: Secondary | ICD-10-CM | POA: Insufficient documentation

## 2011-09-02 DIAGNOSIS — D126 Benign neoplasm of colon, unspecified: Secondary | ICD-10-CM

## 2011-09-02 DIAGNOSIS — Z951 Presence of aortocoronary bypass graft: Secondary | ICD-10-CM | POA: Insufficient documentation

## 2011-09-02 DIAGNOSIS — Z79899 Other long term (current) drug therapy: Secondary | ICD-10-CM | POA: Insufficient documentation

## 2011-09-02 HISTORY — PX: COLONOSCOPY: SHX5424

## 2011-09-02 SURGERY — COLONOSCOPY
Anesthesia: Moderate Sedation

## 2011-09-02 MED ORDER — MEPERIDINE HCL 100 MG/ML IJ SOLN
INTRAMUSCULAR | Status: DC | PRN
  Administered 2011-09-02: 50 mg via INTRAVENOUS

## 2011-09-02 MED ORDER — MEPERIDINE HCL 100 MG/ML IJ SOLN
INTRAMUSCULAR | Status: AC
  Filled 2011-09-02: qty 2

## 2011-09-02 MED ORDER — MIDAZOLAM HCL 5 MG/5ML IJ SOLN
INTRAMUSCULAR | Status: DC | PRN
  Administered 2011-09-02: 1 mg via INTRAVENOUS
  Administered 2011-09-02: 2 mg via INTRAVENOUS
  Administered 2011-09-02: 1 mg via INTRAVENOUS

## 2011-09-02 MED ORDER — MIDAZOLAM HCL 5 MG/5ML IJ SOLN
INTRAMUSCULAR | Status: AC
  Filled 2011-09-02: qty 10

## 2011-09-02 MED ORDER — STERILE WATER FOR IRRIGATION IR SOLN
Status: DC | PRN
  Administered 2011-09-02: 08:00:00

## 2011-09-02 NOTE — Interval H&P Note (Signed)
History and Physical Interval Note:  09/02/2011 7:44 AM  Lenis Dickinson.  has presented today for surgery, with the diagnosis of screening  The various methods of treatment have been discussed with the patient and family. After consideration of risks, benefits and other options for treatment, the patient has consented to  Procedure(s) (LRB): COLONOSCOPY (N/A) as a surgical intervention .  The patients' history has been reviewed, patient examined, no change in status, stable for surgery.  I have reviewed the patients' chart and labs.  Questions were answered to the patient's satisfaction.     Eula Listen

## 2011-09-02 NOTE — H&P (View-Only) (Signed)
Primary Care Physician:  HAWKINS,EDWARD L, MD, MD  Primary Gastroenterologist:  Michael Rourk, MD   Chief Complaint  Patient presents with  . Colonoscopy    HPI:  [name]. is a 66 y.o. male here to schedule colonoscopy. His last one was 10 years ago. He has h/o anemia, likely multifactorial. 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  Last TCS 2003, diverticulosis. EGD 2003, esophageal leiomyoma. Seen again in 2006 for anemia, multiple heme negative stools. LDH elevated, Haptoglobin low c/w hemolytic anemia.   Patient denies constipation, diarrhea, melena, brbpr, abd pain, heartburn, dysphagia, weight loss.  He as h/o chronically mildly elevated alkaline phosphatase dating back nearly 10 years. Abd u/s in 2007 unremarkable. Consumes significant etoh daily. I could not find where AMA ever checked for PBC.    Heme negative X 3 in 05/2011 at Dr. Rothbart's office.    Current Outpatient Prescriptions  Medication Sig Dispense Refill  . acetaminophen (TYLENOL) 500 MG tablet Take 1,000 mg by mouth every 6 (six) hours as needed. For pain       . albuterol (PROVENTIL) (2.5 MG/3ML) 0.083% nebulizer solution Take 3 mLs (2.5 mg total) by nebulization every 6 (six) hours as needed for wheezing.  75 mL  12  . amLODipine (NORVASC) 10 MG tablet Take 10 mg by mouth at bedtime.       . aspirin 81 MG EC tablet Take 81 mg by mouth daily.        . atorvastatin (LIPITOR) 40 MG tablet Take 1 tablet (40 mg total) by mouth daily. To replace Simvastatin  30 tablet  12  . cloNIDine (CATAPRES) 0.1 MG tablet TAKE ONE TABLET BY MOUTH TWICE DAILY  180 tablet  3  . furosemide (LASIX) 40 MG tablet Take 1 tablet (40 mg total) by mouth 2 (two) times daily.  60 tablet  5  . lisinopril (PRINIVIL,ZESTRIL) 20 MG tablet TAKE ONE TABLET BY MOUTH TWICE DAILY  180 tablet  3  . metoprolol (LOPRESSOR) 50 MG tablet TAKE ONE TABLET BY MOUTH TWICE DAILY  90 tablet  3  .  omeprazole (PRILOSEC) 20 MG capsule Take 20 mg by mouth 2 (two) times daily.       . potassium chloride SA (K-DUR,KLOR-CON) 20 MEQ tablet Take 1 tablet (20 mEq total) by mouth 2 (two) times daily.  60 tablet  5  . warfarin (COUMADIN) 5 MG tablet Take 5 mg by mouth at bedtime. Patient takes 1 tab on Sunday, Tuesday, and Thursday Patient takes 1.5 tabs on Monday, Wednesday, Friday, and Saturdays         Allergies as of 08/10/2011  . (No Known Allergies)    Past Medical History  Diagnosis Date  . Hypertension   . Arteriosclerotic cardiovascular disease (ASCVD) 2003    CABG/MVR/AVR-2003 (#25 St. Jude/#21 St. Jude); anticoagulation; negative stress nuclear-2005  . Epistaxis     requiring catery & aterial ligation-09/2009  . GERD (gastroesophageal reflux disease)   . Hyperlipemia   . Abnormal LFTs     possible cirrhosis  . Chronic anticoagulation   . Valvular heart disease     S/P AVR/MVR  . Tobacco abuse     45 pack years  . Fasting hyperglycemia   . Nephrolithiasis   . Anemia     hemolytic anemia  . Diverticulosis   . Hyponatremia   . CHF (congestive heart failure)   . Chronic renal disease   . Hemolytic anemia       history of  . ETOH abuse     Past Surgical History  Procedure Date  . Endocopic sphenopalatine artery ligation & cautry   . Inguinal hernia repair     Left & right  . Coronary artery bypass graft 11/2001    [facility]  . Cardiac valve replacement 11/2001    AVR and MVR-St. Jude devices  . Cataract extraction w/phaco 01/20/2011    Procedure: CATARACT EXTRACTION PHACO AND INTRAOCULAR LENS PLACEMENT (IOC);  Surgeon: Mark T. Shapiro;  Location: [facility];  Service: Ophthalmology;  Laterality: Right;  CDE: 8.51  . Cataract extraction w/phaco 02/03/2011    Procedure: CATARACT EXTRACTION PHACO AND INTRAOCULAR LENS PLACEMENT (IOC);  Surgeon: Mark T. Shapiro;  Location: [facility];  Service: Ophthalmology;  Laterality: Left;  CDE:10.01  . Colonoscopy 04/05/02    friable  anal canal hemorrhoids otherwise normal  . Esophagogastroduodenoscopy 01/2002    Dr. Rehman, submucosal esophageal lesion c/w leiomyoma    Family History  Problem Relation Age of Onset  . Hypotension Neg Hx   . Anesthesia problems Neg Hx   . Malignant hyperthermia Neg Hx   . Pseudochol deficiency Neg Hx   . Colon cancer Neg Hx   . Liver disease Neg Hx     History   Social History  . Marital Status: Widowed    Spouse Name: N/A    Number of Children: 1  . Years of Education: N/A   Occupational History  . retired    Social History Main Topics  . Smoking status: Current Everyday Smoker -- 1.0 packs/day for 40 years    Types: Cigarettes  . Smokeless tobacco: Not on file  . Alcohol Use: 4.2 oz/week    6 Cans of beer, 1 Shots of liquor per week  . Drug Use: No  . Sexually Active: No   Other Topics Concern  . Not on file   Social History Narrative  . No narrative on file      ROS:  General: Negative for anorexia, weight loss, fever, chills, fatigue, weakness. Eyes: Negative for vision changes.  ENT: Negative for hoarseness, difficulty swallowing , nasal congestion. CV: Negative for chest pain, angina, palpitations, dyspnea on exertion, peripheral edema.  Respiratory: Negative for dyspnea at rest, dyspnea on exertion,. C/O cough, clear sputum. No wheezing.  GI: See history of present illness. GU:  Negative for dysuria, hematuria, urinary incontinence, urinary frequency, nocturnal urination.  MS: Negative for joint pain, low back pain.  Derm: Negative for rash or itching.  Neuro: Negative for weakness, abnormal sensation, seizure, frequent headaches, memory loss, confusion.  Psych: Negative for anxiety, depression, suicidal ideation, hallucinations.  Endo: Negative for unusual weight change.  Heme: Negative for bruising or bleeding. Allergy: Negative for rash or hives.    Physical Examination:  BP 80/49  Pulse 73  Temp(Src) 98.2 F (36.8 C) (Temporal)  Ht 5' 6"  (1.676 m)  Wt 162 lb 6.4 oz (73.664 kg)  BMI 26.21 kg/m2   General: Well-nourished, well-developed in no acute distress.  Head: Normocephalic, atraumatic.   Eyes: Conjunctiva pink, no icterus. Mouth: Oropharyngeal mucosa moist and pink , no lesions erythema or exudate. Neck: Supple without thyromegaly, masses, or lymphadenopathy.  Lungs: Clear to auscultation bilaterally.  Heart: Regular rate and rhythm, no murmurs rubs or gallops.  Abdomen: Bowel sounds are normal, nontender, nondistended, no hepatosplenomegaly or masses, no abdominal bruits or    hernia , no rebound or guarding.   Rectal: Not performed. Extremities: No lower extremity   edema. No clubbing or deformities.  Neuro: Alert and oriented x 4 , grossly normal neurologically.  Skin: Warm and dry, no rash or jaundice.   Psych: Alert and cooperative, normal mood and affect.  Labs: Lab Results  Component Value Date   WBC 6.0 05/26/2011   HGB 10.4* 05/26/2011   HCT 32.0* 05/26/2011   MCV 89.1 05/26/2011   PLT 335 05/26/2011   Lab Results  Component Value Date   ALT 15 05/18/2011   AST 29 05/18/2011   ALKPHOS 123* 05/18/2011   BILITOT 0.9 05/18/2011   Lab Results  Component Value Date   CREATININE 0.96 05/26/2011   BUN 16 05/26/2011   NA 134* 05/26/2011   K 3.5 05/26/2011   CL 92* 05/26/2011   CO2 33* 05/26/2011    

## 2011-09-02 NOTE — Discharge Instructions (Signed)
Colonoscopy Discharge Instructions  Read the instructions outlined below and refer to this sheet in the next few weeks. These discharge instructions provide you with general information on caring for yourself after you leave the hospital. Your doctor may also give you specific instructions. While your treatment has been planned according to the most current medical practices available, unavoidable complications occasionally occur. If you have any problems or questions after discharge, call Dr. Jena Gauss at [phone]. ACTIVITY  You may resume your regular activity, but move at a slower pace for the next 24 hours.   Take frequent rest periods for the next 24 hours.   Walking will help get rid of the air and reduce the bloated feeling in your belly (abdomen).   No driving for 24 hours (because of the medicine (anesthesia) used during the test).    Do not sign any important legal documents or operate any machinery for 24 hours (because of the anesthesia used during the test).  NUTRITION  Drink plenty of fluids.   You may resume your normal diet as instructed by your doctor.   Begin with a light meal and progress to your normal diet. Heavy or fried foods are harder to digest and may make you feel sick to your stomach (nauseated).   Avoid alcoholic beverages for 24 hours or as instructed.  MEDICATIONS  You may resume your normal medications unless your doctor tells you otherwise.  WHAT YOU CAN EXPECT TODAY  Some feelings of bloating in the abdomen.   Passage of more gas than usual.   Spotting of blood in your stool or on the toilet paper.  IF YOU HAD POLYPS REMOVED DURING THE COLONOSCOPY:  No aspirin products for 7 days or as instructed.   No alcohol for 7 days or as instructed.   Eat a soft diet for the next 24 hours.  FINDING OUT THE RESULTS OF YOUR TEST Not all test results are available during your visit. If your test results are not back during the visit, make an appointment  with your caregiver to find out the results. Do not assume everything is normal if you have not heard from your caregiver or the medical facility. It is important for you to follow up on all of your test results.  SEEK IMMEDIATE MEDICAL ATTENTION IF:  You have more than a spotting of blood in your stool.   Your belly is swollen (abdominal distention).   You are nauseated or vomiting.   You have a temperature over 101.   You have abdominal pain or discomfort that is severe or gets worse throughout the day.      Polyp and diverticulosis literature provided.  Resume Lovenox today; resume Coumadin today. Continue Lovenox until INR therapeutic.   Further recommendations to follow pending review of pathology report   Diverticulosis Diverticulosis is a common condition that develops when small pouches (diverticula) form in the wall of the colon. The risk of diverticulosis increases with age. It happens more often in people who eat a low-fiber diet. Most individuals with diverticulosis have no symptoms. Those individuals with symptoms usually experience abdominal pain, constipation, or loose stools (diarrhea). HOME CARE INSTRUCTIONS   Increase the amount of fiber in your diet as directed by your caregiver or dietician. This may reduce symptoms of diverticulosis.   Your caregiver may recommend taking a dietary fiber supplement.   Drink at least 6 to 8 glasses of water each day to prevent constipation.   Try not to strain when you  have a bowel movement.   Your caregiver may recommend avoiding nuts and seeds to prevent complications, although this is still an uncertain benefit.   Only take over-the-counter or prescription medicines for pain, discomfort, or fever as directed by your caregiver.  FOODS WITH HIGH FIBER CONTENT INCLUDE:  Fruits. Apple, peach, pear, tangerine, raisins, prunes.   Vegetables. Brussels sprouts, asparagus, broccoli, cabbage, carrot, cauliflower, romaine  lettuce, spinach, summer squash, tomato, winter squash, zucchini.   Starchy Vegetables. Baked beans, kidney beans, lima beans, split peas, lentils, potatoes (with skin).   Grains. Whole wheat bread, brown rice, bran flake cereal, plain oatmeal, white rice, shredded wheat, bran muffins.  SEEK IMMEDIATE MEDICAL CARE IF:   You develop increasing pain or severe bloating.   You have an oral temperature above 102 F (38.9 C), not controlled by medicine.   You develop vomiting or bowel movements that are bloody or black.  Document Released: 03/19/2004 Document Revised: 03/04/2011 Document Reviewed: 11/20/2009 [facility] Patient Information 2012 [place], [place].Colon Polyps A polyp is extra tissue that grows inside your body. Colon polyps grow in the large intestine. The large intestine, also called the colon, is part of your digestive system. It is a long, hollow tube at the end of your digestive tract where your body makes and stores stool. Most polyps are not dangerous. They are benign. This means they are not cancerous. But over time, some types of polyps can turn into cancer. Polyps that are smaller than a pea are usually not harmful. But larger polyps could someday become or may already be cancerous. To be safe, doctors remove all polyps and test them.  WHO GETS POLYPS? Anyone can get polyps, but certain people are more likely than others. You may have a greater chance of getting polyps if:  You are over 50.   You have had polyps before.   Someone in your family has had polyps.   Someone in your family has had cancer of the large intestine.   Find out if someone in your family has had polyps. You may also be more likely to get polyps if you:   Eat a lot of fatty foods.   Smoke.   Drink alcohol.   Do not exercise.   Eat too much.  SYMPTOMS  Most small polyps do not cause symptoms. People often do not know they have one until their caregiver finds it during a regular checkup or  while testing them for something else. Some people do have symptoms like these:  Bleeding from the anus. You might notice blood on your underwear or on toilet paper after you have had a bowel movement.   Constipation or diarrhea that lasts more than a week.   Blood in the stool. Blood can make stool look black or it can show up as red streaks in the stool.  If you have any of these symptoms, see your caregiver. HOW DOES THE DOCTOR TEST FOR POLYPS? The doctor can use four tests to check for polyps:  Digital rectal exam. The caregiver wears gloves and checks your rectum (the last part of the large intestine) to see if it feels normal. This test would find polyps only in the rectum. Your caregiver may need to do one of the other tests listed below to find polyps higher up in the intestine.   Barium enema. The caregiver puts a liquid called barium into your rectum before taking x-rays of your large intestine. Barium makes your intestine look white in the pictures.  Polyps are dark, so they are easy to see.   Sigmoidoscopy. With this test, the caregiver can see inside your large intestine. A thin flexible tube is placed into your rectum. The device is called a sigmoidoscope, which has a light and a tiny video camera in it. The caregiver uses the sigmoidoscope to look at the last third of your large intestine.   Colonoscopy. This test is like sigmoidoscopy, but the caregiver looks at all of the large intestine. It usually requires sedation. This is the most common method for finding and removing polyps.  TREATMENT   The caregiver will remove the polyp during sigmoidoscopy or colonoscopy. The polyp is then tested for cancer.   If you have had polyps, your caregiver may want you to get tested regularly in the future.  PREVENTION  There is not one sure way to prevent polyps. You might be able to lower your risk of getting them if you:  Eat more fruits and vegetables and less fatty food.   Do not  smoke.   Avoid alcohol.   Exercise every day.   Lose weight if you are overweight.   Eating more calcium and folate can also lower your risk of getting polyps. Some foods that are rich in calcium are milk, cheese, and broccoli. Some foods that are rich in folate are chickpeas, kidney beans, and spinach.   Aspirin might help prevent polyps. Studies are under way.  Document Released: 03/18/2004 Document Revised: 03/04/2011 Document Reviewed: 08/24/2007 [facility] Patient Information 2012 [place], [place].

## 2011-09-02 NOTE — Op Note (Signed)
[facility] [address] [place], [place]  [address]  COLONOSCOPY PROCEDURE REPORT  PATIENT:  [name], [name]  MR#:  [id number] BIRTHDATE:  [birth year], 65 yrs. old  GENDER:  male ENDOSCOPIST:  R. Roetta Sessions, MD FACP [facility] REF. BY:  Gerrit Friends. Dietrich Pates, M.D. Kari Baars, M.D. PROCEDURE DATE:  09/02/2011 PROCEDURE:  Colonoscopy  with  biopsy  INDICATIONS:  Average risk screening colonoscopy  INFORMED CONSENT:  The risks, benefits, alternatives and imponderables including but not limited to bleeding, perforation as well as the possibility of a missed lesion have been reviewed. The potential for biopsy, lesion removal, etc. have also been discussed.  Questions have been answered.  All parties agreeable. Please see the history and physical in the medical record for more information.  MEDICATIONS:  Versed 4 mg IV and Demerol 50 mg IV in divided doses  DESCRIPTION OF PROCEDURE:  After a digital rectal exam was performed, the EC-[id number] ([id number]) colonoscope was advanced from the anus through the rectum and colon to the area of the cecum, ileocecal valve and appendiceal orifice.  The cecum was deeply intubated.  These structures were well-seen and photographed for the record.  From the level of the cecum and ileocecal valve, the scope was slowly and cautiously withdrawn.  The mucosal surfaces were carefully surveyed utilizing scope tip deflection to facilitate fold flattening as needed.  The scope was pulled down into the rectum where a thorough examination including retroflexion was performed. <<PROCEDUREIMAGES>>  FINDINGS: Adequate preparation. Normal rectum. Shallow sigmoid diverticula; a single diminutive polyp in the mid descending colon and a second                     one at the hepatic flexure; otherwise, the remainder of the colonic mucosa appeared normal THERAPEUTIC / DIAGNOSTIC MANEUVERS PERFORMED:  The 2 diminutive polyps removed with cold biopsy forcep  technique  COMPLICATIONS:  None  CECAL WITHDRAWAL TIME: 10 minutes  IMPRESSION:  Sigmoid diverticulosis; diminutive  colon polyps-removed as described  RECOMMENDATIONS:  Follow up on pathology. Resume Lovenox today. Resume Coumadin today. Continue Lovenox until INR therapeutic.  ______________________________ R. Roetta Sessions, MD Cale[facility] Essex  CC:  Gerrit Friends. Dietrich Pates, M.D. Kari Baars, M.D.  n. eSIGNED:   R. Roetta Sessions at 09/02/2011 08:15 AM  Rosalin Hawking, [phone]

## 2011-09-06 ENCOUNTER — Encounter: Payer: Self-pay | Admitting: Cardiology

## 2011-09-06 ENCOUNTER — Encounter: Payer: Self-pay | Admitting: Internal Medicine

## 2011-09-08 ENCOUNTER — Encounter (HOSPITAL_COMMUNITY): Payer: Self-pay | Admitting: Internal Medicine

## 2011-09-10 ENCOUNTER — Ambulatory Visit (INDEPENDENT_AMBULATORY_CARE_PROVIDER_SITE_OTHER): Payer: Medicare Other | Admitting: *Deleted

## 2011-09-10 DIAGNOSIS — Z7901 Long term (current) use of anticoagulants: Secondary | ICD-10-CM

## 2011-09-10 DIAGNOSIS — I359 Nonrheumatic aortic valve disorder, unspecified: Secondary | ICD-10-CM

## 2011-09-10 DIAGNOSIS — Z954 Presence of other heart-valve replacement: Secondary | ICD-10-CM

## 2011-09-10 DIAGNOSIS — Z9889 Other specified postprocedural states: Secondary | ICD-10-CM

## 2011-09-10 DIAGNOSIS — I059 Rheumatic mitral valve disease, unspecified: Secondary | ICD-10-CM

## 2011-09-17 ENCOUNTER — Ambulatory Visit (INDEPENDENT_AMBULATORY_CARE_PROVIDER_SITE_OTHER): Payer: Medicare Other | Admitting: *Deleted

## 2011-09-17 DIAGNOSIS — I359 Nonrheumatic aortic valve disorder, unspecified: Secondary | ICD-10-CM

## 2011-09-17 DIAGNOSIS — Z9889 Other specified postprocedural states: Secondary | ICD-10-CM

## 2011-09-17 DIAGNOSIS — I059 Rheumatic mitral valve disease, unspecified: Secondary | ICD-10-CM

## 2011-09-17 DIAGNOSIS — Z954 Presence of other heart-valve replacement: Secondary | ICD-10-CM

## 2011-09-17 DIAGNOSIS — Z7901 Long term (current) use of anticoagulants: Secondary | ICD-10-CM

## 2011-09-17 LAB — POCT INR: INR: 2.5

## 2011-10-07 ENCOUNTER — Ambulatory Visit (INDEPENDENT_AMBULATORY_CARE_PROVIDER_SITE_OTHER): Payer: Medicare Other | Admitting: *Deleted

## 2011-10-07 DIAGNOSIS — Z9889 Other specified postprocedural states: Secondary | ICD-10-CM

## 2011-10-07 DIAGNOSIS — Z954 Presence of other heart-valve replacement: Secondary | ICD-10-CM

## 2011-10-07 DIAGNOSIS — Z7901 Long term (current) use of anticoagulants: Secondary | ICD-10-CM

## 2011-10-21 ENCOUNTER — Ambulatory Visit (INDEPENDENT_AMBULATORY_CARE_PROVIDER_SITE_OTHER): Payer: Medicare Other | Admitting: *Deleted

## 2011-10-21 DIAGNOSIS — Z9889 Other specified postprocedural states: Secondary | ICD-10-CM

## 2011-10-21 DIAGNOSIS — Z7901 Long term (current) use of anticoagulants: Secondary | ICD-10-CM

## 2011-10-21 DIAGNOSIS — Z954 Presence of other heart-valve replacement: Secondary | ICD-10-CM

## 2011-10-21 LAB — POCT INR: INR: 2.7

## 2011-11-11 ENCOUNTER — Ambulatory Visit (INDEPENDENT_AMBULATORY_CARE_PROVIDER_SITE_OTHER): Payer: Medicare Other | Admitting: *Deleted

## 2011-11-11 DIAGNOSIS — Z954 Presence of other heart-valve replacement: Secondary | ICD-10-CM

## 2011-11-11 DIAGNOSIS — Z7901 Long term (current) use of anticoagulants: Secondary | ICD-10-CM

## 2011-11-11 DIAGNOSIS — Z9889 Other specified postprocedural states: Secondary | ICD-10-CM

## 2011-11-11 DIAGNOSIS — I059 Rheumatic mitral valve disease, unspecified: Secondary | ICD-10-CM

## 2011-11-11 LAB — POCT INR: INR: 2.7

## 2011-11-16 ENCOUNTER — Other Ambulatory Visit: Payer: Self-pay | Admitting: Cardiology

## 2011-12-10 ENCOUNTER — Ambulatory Visit (INDEPENDENT_AMBULATORY_CARE_PROVIDER_SITE_OTHER): Payer: Medicare Other | Admitting: *Deleted

## 2011-12-10 DIAGNOSIS — Z7901 Long term (current) use of anticoagulants: Secondary | ICD-10-CM

## 2011-12-10 DIAGNOSIS — Z954 Presence of other heart-valve replacement: Secondary | ICD-10-CM

## 2011-12-10 DIAGNOSIS — Z9889 Other specified postprocedural states: Secondary | ICD-10-CM

## 2011-12-10 LAB — POCT INR: INR: 3.7

## 2011-12-15 ENCOUNTER — Other Ambulatory Visit: Payer: Self-pay | Admitting: Cardiology

## 2012-01-11 ENCOUNTER — Ambulatory Visit (INDEPENDENT_AMBULATORY_CARE_PROVIDER_SITE_OTHER): Payer: Medicare Other | Admitting: *Deleted

## 2012-01-11 DIAGNOSIS — Z9889 Other specified postprocedural states: Secondary | ICD-10-CM

## 2012-01-11 DIAGNOSIS — Z954 Presence of other heart-valve replacement: Secondary | ICD-10-CM

## 2012-01-11 DIAGNOSIS — Z7901 Long term (current) use of anticoagulants: Secondary | ICD-10-CM

## 2012-02-08 ENCOUNTER — Ambulatory Visit (INDEPENDENT_AMBULATORY_CARE_PROVIDER_SITE_OTHER): Payer: Medicare Other | Admitting: *Deleted

## 2012-02-08 DIAGNOSIS — Z9889 Other specified postprocedural states: Secondary | ICD-10-CM

## 2012-02-08 DIAGNOSIS — Z7901 Long term (current) use of anticoagulants: Secondary | ICD-10-CM

## 2012-02-08 DIAGNOSIS — Z954 Presence of other heart-valve replacement: Secondary | ICD-10-CM

## 2012-02-08 LAB — POCT INR: INR: 3.8

## 2012-02-15 ENCOUNTER — Other Ambulatory Visit: Payer: Self-pay | Admitting: Cardiology

## 2012-03-10 ENCOUNTER — Ambulatory Visit (INDEPENDENT_AMBULATORY_CARE_PROVIDER_SITE_OTHER): Payer: Medicare Other | Admitting: *Deleted

## 2012-03-10 DIAGNOSIS — Z954 Presence of other heart-valve replacement: Secondary | ICD-10-CM

## 2012-03-10 DIAGNOSIS — Z9889 Other specified postprocedural states: Secondary | ICD-10-CM

## 2012-03-10 DIAGNOSIS — Z7901 Long term (current) use of anticoagulants: Secondary | ICD-10-CM

## 2012-03-10 LAB — POCT INR: INR: 4.9

## 2012-03-17 ENCOUNTER — Ambulatory Visit (INDEPENDENT_AMBULATORY_CARE_PROVIDER_SITE_OTHER): Payer: Medicare Other | Admitting: *Deleted

## 2012-03-17 DIAGNOSIS — Z9889 Other specified postprocedural states: Secondary | ICD-10-CM

## 2012-03-17 DIAGNOSIS — Z7901 Long term (current) use of anticoagulants: Secondary | ICD-10-CM

## 2012-03-17 DIAGNOSIS — Z954 Presence of other heart-valve replacement: Secondary | ICD-10-CM

## 2012-03-22 ENCOUNTER — Ambulatory Visit (INDEPENDENT_AMBULATORY_CARE_PROVIDER_SITE_OTHER): Payer: Medicare Other | Admitting: Adult Health

## 2012-03-22 ENCOUNTER — Encounter: Payer: Self-pay | Admitting: Adult Health

## 2012-03-22 ENCOUNTER — Ambulatory Visit: Payer: Medicare Other | Admitting: Cardiology

## 2012-03-22 VITALS — BP 120/64 | HR 67 | Ht 66.0 in | Wt 142.0 lb

## 2012-03-22 DIAGNOSIS — I10 Essential (primary) hypertension: Secondary | ICD-10-CM

## 2012-03-22 DIAGNOSIS — I7090 Unspecified atherosclerosis: Secondary | ICD-10-CM

## 2012-03-22 DIAGNOSIS — Z954 Presence of other heart-valve replacement: Secondary | ICD-10-CM

## 2012-03-22 DIAGNOSIS — I2510 Atherosclerotic heart disease of native coronary artery without angina pectoris: Secondary | ICD-10-CM

## 2012-03-22 DIAGNOSIS — E785 Hyperlipidemia, unspecified: Secondary | ICD-10-CM

## 2012-03-22 NOTE — Assessment & Plan Note (Signed)
He offers no complaints of chest pain increased dyspnea on exertion dizziness or lightheadedness. We will continue to monitor him on an annual basis for we have been to see him sooner should he become symptomatic. Continued risk factor modification with low sodium low cholesterol diet and smoking cessation was recommended.

## 2012-03-22 NOTE — Assessment & Plan Note (Signed)
Good crisp click is noted on exam. He will remain on Coumadin and instructed for dosing adjustments per INR. He offers no complaints of bleeding issues. He did have some epistaxis in the past but this is no longer a problem for him. Followup CBC should be drawn by Dr. Juanetta Gosling for continued assessment.

## 2012-03-22 NOTE — Patient Instructions (Addendum)

## 2012-03-22 NOTE — Assessment & Plan Note (Signed)
I have advised him smoking cessation would help with cholesterol status. He will have followup labs completed by Dr. Juanetta Gosling next week. He verbalizes understanding.

## 2012-03-22 NOTE — Progress Notes (Signed)
HPI: [name] is a 89 show male patient of Dr. Keyes Bing we are following for ongoing assessment and treatment of known history of CAD and valvular heart disease. This is an annual followup. He has a history of aortic valve replacement with a chronic Coumadin therapy, followed in our office for PT INR and dosing. Patient also has a history of hyperlipidemia and hypertension. He is also followed by Dr. Abbe Amsterdam his primary care physician where labs are completed every 6 months. The patient is without complaint today of chest discomfort shortness of breath nausea or vomiting. He states prior to last office visit one year ago he was in the hospital overnight for some fluid retention but has not had any issue since that time. The patient unfortunately continues to smoke cigarettes. He has not had any ER visits or hospitalizations since we saw him last.  No Known Allergies  Current Outpatient Prescriptions  Medication Sig Dispense Refill  . acetaminophen (TYLENOL) 500 MG tablet Take 1,000 mg by mouth every 6 (six) hours as needed. For pain       . albuterol (PROVENTIL) (2.5 MG/3ML) 0.083% nebulizer solution Take 3 mLs (2.5 mg total) by nebulization every 6 (six) hours as needed for wheezing.  75 mL  12  . amLODipine (NORVASC) 10 MG tablet Take 10 mg by mouth at bedtime.       Marland Kitchen aspirin 81 MG EC tablet Take 81 mg by mouth daily.        Marland Kitchen atorvastatin (LIPITOR) 40 MG tablet Take 1 tablet (40 mg total) by mouth daily. To replace Simvastatin  30 tablet  12  . cloNIDine (CATAPRES) 0.1 MG tablet TAKE ONE TABLET BY MOUTH TWICE DAILY  180 tablet  3  . furosemide (LASIX) 40 MG tablet Take 1 tablet (40 mg total) by mouth 2 (two) times daily.  60 tablet  5  . HYDROcodone-acetaminophen (LORTAB) 10-500 MG per tablet Take 1 tablet by mouth 4 times daily as needed.      Marland Kitchen lisinopril (PRINIVIL,ZESTRIL) 20 MG tablet TAKE ONE TABLET BY MOUTH TWICE DAILY  180 tablet  3  . metoprolol (LOPRESSOR) 50 MG tablet TAKE  ONE TABLET BY MOUTH TWICE DAILY  180 tablet  3  . omeprazole (PRILOSEC) 20 MG capsule Take 20 mg by mouth 2 (two) times daily.       . potassium chloride SA (K-DUR,KLOR-CON) 20 MEQ tablet Take 1 tablet (20 mEq total) by mouth 2 (two) times daily.  60 tablet  5  . warfarin (COUMADIN) 5 MG tablet Take 5 mg by mouth as directed. Patient takes 1 tab daily except 1.5 tabs 2 days per week      . DISCONTD: warfarin (COUMADIN) 5 MG tablet        . DISCONTD: warfarin (COUMADIN) 5 MG tablet TAKE ONE TABLET BY MOUTH EVERY DAY, EXCEPT TAKE ONE AND ONE-HALF TABLETS ON MONDAY, WEDNESDAYS, AND FRIDAYS, OR AS DIRECTED BY COUMADIN CLIN  45 tablet  1    Past Medical History  Diagnosis Date  . Hypertension   . Arteriosclerotic cardiovascular disease (ASCVD) 2003    CABG/MVR/AVR-2003 (#25 St. Jude/#21 St. Jude); anticoagulation; negative stress nuclear-2005  . Epistaxis     requiring catery & aterial ligation-09/2009  . GERD (gastroesophageal reflux disease)   . Hyperlipemia   . Abnormal LFTs     possible cirrhosis  . Chronic anticoagulation   . Valvular heart disease     S/P AVR/MVR  . Tobacco abuse  45 pack years  . Fasting hyperglycemia   . Nephrolithiasis   . Anemia     hemolytic anemia  . Diverticulosis   . Hyponatremia   . CHF (congestive heart failure)   . Chronic renal disease   . Hemolytic anemia     history of  . ETOH abuse     Past Surgical History  Procedure Date  . Endocopic sphenopalatine artery ligation & cautry   . Inguinal hernia repair     Left & right  . Coronary artery bypass graft 11/2001    [facility]  . Cardiac valve replacement 11/2001    AVR and MVR-St. Jude devices  . Cataract extraction w/phaco 01/20/2011    Procedure: CATARACT EXTRACTION PHACO AND INTRAOCULAR LENS PLACEMENT (IOC);  Surgeon: Loraine Leriche T. Nile Riggs;  Location: [facility];  Service: Ophthalmology;  Laterality: Right;  CDE: 8.51  . Cataract extraction w/phaco 02/03/2011    Procedure: CATARACT  EXTRACTION PHACO AND INTRAOCULAR LENS PLACEMENT (IOC);  Surgeon: Loraine Leriche T. Nile Riggs;  Location: [facility];  Service: Ophthalmology;  Laterality: Left;  CDE:10.01  . Colonoscopy 04/05/02; 08/2011    friable anal canal hemorrhoids otherwise normal; 2 diminutive polyps excised, minimal diverticulosis noted  . Esophagogastroduodenoscopy 01/2002    Dr. Karilyn Cota, submucosal esophageal lesion c/w leiomyoma  . Colonoscopy 09/02/2011    Procedure: COLONOSCOPY;  Surgeon: Corbin Ade, MD;  Location: [facility] SUITE;  Service: Endoscopy;  Laterality: N/A;  8:15    ZOX:WRUEAV of systems complete and found to be negative unless listed above  PHYSICAL EXAM BP 120/64  Pulse 67  Ht 5\' 6"  (1.676 m)  Wt 142 lb (64.411 kg)  BMI 22.92 kg/m2  General: Well developed, well nourished, in no acute distress Head: Eyes PERRLA, No xanthomas.   Normal cephalic and atraumatic Lungs: Clear bilaterally to auscultation and percussion. Heart: HRRR S1 S2, crisp diastolic click. without MRG.  Pulses are 2+ & equal. No carotid bruit. No JVD.  No abdominal bruits. No femoral bruits. Abdomen: Bowel sounds are positive, abdomen soft and non-tender without masses or  Hernia's noted. Msk:  Back normal, normal gait. Normal strength and tone for age. Extremities: No clubbing, cyanosis or edema.  DP +1 Neuro: Alert and oriented X 3. Psych:  Good affect, responds appropriately  EKG: Sinus rhythm with first degree AV block and occasional PVCs, early repolarization is noted. Ventricular rate of 68 beats per minute.  ASSESSMENT AND PLAN

## 2012-03-22 NOTE — Assessment & Plan Note (Signed)
After control of blood pressure on current medication regimen. He will continue metoprolol, lisinopril, and amlodipine. Followup labs will be drawn next week per Dr. Juanetta Gosling office. Will receive copies of same were reviewed in the chart.

## 2012-04-07 ENCOUNTER — Ambulatory Visit (INDEPENDENT_AMBULATORY_CARE_PROVIDER_SITE_OTHER): Payer: Medicare Other | Admitting: *Deleted

## 2012-04-07 DIAGNOSIS — Z7901 Long term (current) use of anticoagulants: Secondary | ICD-10-CM

## 2012-04-07 DIAGNOSIS — Z9889 Other specified postprocedural states: Secondary | ICD-10-CM

## 2012-04-07 DIAGNOSIS — Z954 Presence of other heart-valve replacement: Secondary | ICD-10-CM

## 2012-04-15 ENCOUNTER — Other Ambulatory Visit: Payer: Self-pay | Admitting: *Deleted

## 2012-04-15 MED ORDER — WARFARIN SODIUM 5 MG PO TABS: 5.0000 mg | ORAL_TABLET | Freq: Every day | ORAL | Status: DC

## 2012-05-05 ENCOUNTER — Ambulatory Visit (INDEPENDENT_AMBULATORY_CARE_PROVIDER_SITE_OTHER): Payer: Medicare Other | Admitting: *Deleted

## 2012-05-05 DIAGNOSIS — Z9889 Other specified postprocedural states: Secondary | ICD-10-CM

## 2012-05-05 DIAGNOSIS — Z7901 Long term (current) use of anticoagulants: Secondary | ICD-10-CM

## 2012-05-05 DIAGNOSIS — Z954 Presence of other heart-valve replacement: Secondary | ICD-10-CM

## 2012-06-08 ENCOUNTER — Ambulatory Visit (INDEPENDENT_AMBULATORY_CARE_PROVIDER_SITE_OTHER): Payer: Medicare Other | Admitting: *Deleted

## 2012-06-08 DIAGNOSIS — Z9889 Other specified postprocedural states: Secondary | ICD-10-CM

## 2012-06-08 DIAGNOSIS — Z7901 Long term (current) use of anticoagulants: Secondary | ICD-10-CM

## 2012-06-08 DIAGNOSIS — Z954 Presence of other heart-valve replacement: Secondary | ICD-10-CM

## 2012-06-08 LAB — POCT INR: INR: 3.1

## 2012-06-16 ENCOUNTER — Other Ambulatory Visit: Payer: Self-pay | Admitting: Cardiology

## 2012-06-16 NOTE — Telephone Encounter (Signed)
rx sent to pharmacy by e-script per pt last OV advised to follow up in one year

## 2012-07-16 ENCOUNTER — Other Ambulatory Visit: Payer: Self-pay | Admitting: Cardiology

## 2012-07-20 ENCOUNTER — Ambulatory Visit (INDEPENDENT_AMBULATORY_CARE_PROVIDER_SITE_OTHER): Payer: Medicare Other | Admitting: *Deleted

## 2012-07-20 DIAGNOSIS — Z7901 Long term (current) use of anticoagulants: Secondary | ICD-10-CM

## 2012-07-20 DIAGNOSIS — Z954 Presence of other heart-valve replacement: Secondary | ICD-10-CM

## 2012-07-20 DIAGNOSIS — Z9889 Other specified postprocedural states: Secondary | ICD-10-CM

## 2012-08-17 ENCOUNTER — Other Ambulatory Visit: Payer: Self-pay | Admitting: Cardiology

## 2012-08-31 ENCOUNTER — Ambulatory Visit (INDEPENDENT_AMBULATORY_CARE_PROVIDER_SITE_OTHER): Payer: Medicare Other | Admitting: *Deleted

## 2012-08-31 DIAGNOSIS — Z7901 Long term (current) use of anticoagulants: Secondary | ICD-10-CM

## 2012-08-31 DIAGNOSIS — Z9889 Other specified postprocedural states: Secondary | ICD-10-CM

## 2012-08-31 DIAGNOSIS — Z954 Presence of other heart-valve replacement: Secondary | ICD-10-CM

## 2012-08-31 LAB — POCT INR: INR: 3.2

## 2012-09-13 ENCOUNTER — Other Ambulatory Visit: Payer: Self-pay | Admitting: Cardiology

## 2012-10-12 ENCOUNTER — Ambulatory Visit (INDEPENDENT_AMBULATORY_CARE_PROVIDER_SITE_OTHER): Payer: Medicare Other | Admitting: *Deleted

## 2012-10-12 DIAGNOSIS — Z9889 Other specified postprocedural states: Secondary | ICD-10-CM

## 2012-10-12 DIAGNOSIS — Z954 Presence of other heart-valve replacement: Secondary | ICD-10-CM

## 2012-10-12 DIAGNOSIS — Z7901 Long term (current) use of anticoagulants: Secondary | ICD-10-CM

## 2012-10-12 LAB — POCT INR: INR: 3

## 2012-11-23 ENCOUNTER — Ambulatory Visit (INDEPENDENT_AMBULATORY_CARE_PROVIDER_SITE_OTHER): Payer: Medicare Other | Admitting: *Deleted

## 2012-11-23 DIAGNOSIS — Z954 Presence of other heart-valve replacement: Secondary | ICD-10-CM

## 2012-11-23 DIAGNOSIS — Z7901 Long term (current) use of anticoagulants: Secondary | ICD-10-CM

## 2012-11-23 DIAGNOSIS — Z9889 Other specified postprocedural states: Secondary | ICD-10-CM

## 2012-11-23 LAB — POCT INR: INR: 2.9

## 2012-11-23 MED ORDER — AMLODIPINE BESYLATE 10 MG PO TABS: 10.0000 mg | ORAL_TABLET | Freq: Every day | ORAL | Status: DC

## 2012-12-15 ENCOUNTER — Other Ambulatory Visit: Payer: Self-pay | Admitting: Cardiology

## 2012-12-15 NOTE — Telephone Encounter (Signed)
Medication sent via escribe.  

## 2013-01-04 ENCOUNTER — Ambulatory Visit (INDEPENDENT_AMBULATORY_CARE_PROVIDER_SITE_OTHER): Payer: Medicare Other | Admitting: *Deleted

## 2013-01-04 DIAGNOSIS — Z9889 Other specified postprocedural states: Secondary | ICD-10-CM

## 2013-01-04 DIAGNOSIS — Z954 Presence of other heart-valve replacement: Secondary | ICD-10-CM

## 2013-01-04 DIAGNOSIS — Z7901 Long term (current) use of anticoagulants: Secondary | ICD-10-CM

## 2013-01-16 ENCOUNTER — Encounter (HOSPITAL_COMMUNITY): Payer: Self-pay

## 2013-01-16 ENCOUNTER — Inpatient Hospital Stay (HOSPITAL_COMMUNITY)
Admission: EM | Admit: 2013-01-16 | Discharge: 2013-01-22 | DRG: 246 | Disposition: A | Discharge status: Home / Self Care | Payer: Medicare Other | Attending: Cardiovascular Disease | Admitting: Cardiovascular Disease

## 2013-01-16 ENCOUNTER — Emergency Department (HOSPITAL_COMMUNITY): Payer: Medicare Other

## 2013-01-16 DIAGNOSIS — I4891 Unspecified atrial fibrillation: Secondary | ICD-10-CM

## 2013-01-16 DIAGNOSIS — I2510 Atherosclerotic heart disease of native coronary artery without angina pectoris: Secondary | ICD-10-CM | POA: Diagnosis present

## 2013-01-16 DIAGNOSIS — R7301 Impaired fasting glucose: Secondary | ICD-10-CM

## 2013-01-16 DIAGNOSIS — I5033 Acute on chronic diastolic (congestive) heart failure: Secondary | ICD-10-CM | POA: Diagnosis present

## 2013-01-16 DIAGNOSIS — E785 Hyperlipidemia, unspecified: Secondary | ICD-10-CM | POA: Diagnosis present

## 2013-01-16 DIAGNOSIS — I509 Heart failure, unspecified: Secondary | ICD-10-CM | POA: Diagnosis present

## 2013-01-16 DIAGNOSIS — Y832 Surgical operation with anastomosis, bypass or graft as the cause of abnormal reaction of the patient, or of later complication, without mention of misadventure at the time of the procedure: Secondary | ICD-10-CM | POA: Diagnosis present

## 2013-01-16 DIAGNOSIS — Z952 Presence of prosthetic heart valve: Secondary | ICD-10-CM

## 2013-01-16 DIAGNOSIS — D649 Anemia, unspecified: Secondary | ICD-10-CM

## 2013-01-16 DIAGNOSIS — Z9889 Other specified postprocedural states: Secondary | ICD-10-CM

## 2013-01-16 DIAGNOSIS — R0602 Shortness of breath: Secondary | ICD-10-CM

## 2013-01-16 DIAGNOSIS — J9601 Acute respiratory failure with hypoxia: Secondary | ICD-10-CM | POA: Diagnosis present

## 2013-01-16 DIAGNOSIS — Z7901 Long term (current) use of anticoagulants: Secondary | ICD-10-CM

## 2013-01-16 DIAGNOSIS — I472 Ventricular tachycardia, unspecified: Secondary | ICD-10-CM | POA: Diagnosis not present

## 2013-01-16 DIAGNOSIS — I10 Essential (primary) hypertension: Secondary | ICD-10-CM | POA: Diagnosis present

## 2013-01-16 DIAGNOSIS — R945 Abnormal results of liver function studies: Secondary | ICD-10-CM

## 2013-01-16 DIAGNOSIS — R079 Chest pain, unspecified: Secondary | ICD-10-CM

## 2013-01-16 DIAGNOSIS — N200 Calculus of kidney: Secondary | ICD-10-CM

## 2013-01-16 DIAGNOSIS — I4729 Other ventricular tachycardia: Secondary | ICD-10-CM | POA: Diagnosis not present

## 2013-01-16 DIAGNOSIS — R040 Epistaxis: Secondary | ICD-10-CM

## 2013-01-16 DIAGNOSIS — K219 Gastro-esophageal reflux disease without esophagitis: Secondary | ICD-10-CM | POA: Diagnosis present

## 2013-01-16 DIAGNOSIS — N189 Chronic kidney disease, unspecified: Secondary | ICD-10-CM | POA: Diagnosis present

## 2013-01-16 DIAGNOSIS — D599 Acquired hemolytic anemia, unspecified: Secondary | ICD-10-CM | POA: Diagnosis present

## 2013-01-16 DIAGNOSIS — Z954 Presence of other heart-valve replacement: Secondary | ICD-10-CM

## 2013-01-16 DIAGNOSIS — F17200 Nicotine dependence, unspecified, uncomplicated: Secondary | ICD-10-CM | POA: Diagnosis present

## 2013-01-16 DIAGNOSIS — J449 Chronic obstructive pulmonary disease, unspecified: Secondary | ICD-10-CM | POA: Diagnosis present

## 2013-01-16 DIAGNOSIS — I214 Non-ST elevation (NSTEMI) myocardial infarction: Principal | ICD-10-CM | POA: Diagnosis present

## 2013-01-16 DIAGNOSIS — I7090 Unspecified atherosclerosis: Secondary | ICD-10-CM

## 2013-01-16 DIAGNOSIS — T82897A Other specified complication of cardiac prosthetic devices, implants and grafts, initial encounter: Secondary | ICD-10-CM | POA: Diagnosis present

## 2013-01-16 DIAGNOSIS — J4489 Other specified chronic obstructive pulmonary disease: Secondary | ICD-10-CM | POA: Diagnosis present

## 2013-01-16 DIAGNOSIS — E871 Hypo-osmolality and hyponatremia: Secondary | ICD-10-CM | POA: Diagnosis present

## 2013-01-16 DIAGNOSIS — F1010 Alcohol abuse, uncomplicated: Secondary | ICD-10-CM | POA: Diagnosis present

## 2013-01-16 DIAGNOSIS — I129 Hypertensive chronic kidney disease with stage 1 through stage 4 chronic kidney disease, or unspecified chronic kidney disease: Secondary | ICD-10-CM | POA: Diagnosis present

## 2013-01-16 DIAGNOSIS — K5730 Diverticulosis of large intestine without perforation or abscess without bleeding: Secondary | ICD-10-CM | POA: Diagnosis present

## 2013-01-16 DIAGNOSIS — J9600 Acute respiratory failure, unspecified whether with hypoxia or hypercapnia: Secondary | ICD-10-CM | POA: Diagnosis not present

## 2013-01-16 HISTORY — DX: Chronic obstructive pulmonary disease, unspecified: J44.9

## 2013-01-16 HISTORY — DX: Unspecified atrial fibrillation: I48.91

## 2013-01-16 LAB — TROPONIN I
Troponin I: 0.37 ng/mL (ref <0.30)
Troponin I: 11.83 ng/mL (ref <0.30)

## 2013-01-16 LAB — CBC WITH DIFFERENTIAL/PLATELET
Basophils Relative: 1 % (ref 0–1)
Eosinophils Absolute: 0.1 10*3/uL (ref 0.0–0.7)
Eosinophils Relative: 2 % (ref 0–5)
Hemoglobin: 11.2 g/dL — ABNORMAL LOW (ref 13.0–17.0)
MCH: 28.2 pg (ref 26.0–34.0)
MCHC: 33.5 g/dL (ref 30.0–36.0)
Monocytes Absolute: 0.4 10*3/uL (ref 0.1–1.0)
Monocytes Relative: 7 % (ref 3–12)
Neutrophils Relative %: 82 % — ABNORMAL HIGH (ref 43–77)

## 2013-01-16 LAB — BASIC METABOLIC PANEL
BUN: 24 mg/dL — ABNORMAL HIGH (ref 6–23)
Calcium: 8.9 mg/dL (ref 8.4–10.5)
Creatinine, Ser: 1.08 mg/dL (ref 0.50–1.35)
GFR calc Af Amer: 80 mL/min — ABNORMAL LOW (ref >90)
GFR calc non Af Amer: 69 mL/min — ABNORMAL LOW (ref >90)
Potassium: 4.6 mEq/L (ref 3.5–5.1)

## 2013-01-16 LAB — GLUCOSE, CAPILLARY: Glucose-Capillary: 209 mg/dL — ABNORMAL HIGH (ref 70–99)

## 2013-01-16 LAB — MRSA PCR SCREENING: MRSA by PCR: NEGATIVE

## 2013-01-16 MED ORDER — ASPIRIN 81 MG PO CHEW: 324.0000 mg | CHEWABLE_TABLET | ORAL | Status: DC

## 2013-01-16 MED ORDER — ENOXAPARIN SODIUM 40 MG/0.4ML ~~LOC~~ SOLN
40.0000 mg | SUBCUTANEOUS | Status: DC
  Filled 2013-01-16: qty 0.4

## 2013-01-16 MED ORDER — ALBUTEROL (5 MG/ML) CONTINUOUS INHALATION SOLN
INHALATION_SOLUTION | RESPIRATORY_TRACT | Status: AC
  Administered 2013-01-16: 10 mg
  Filled 2013-01-16: qty 20

## 2013-01-16 MED ORDER — SODIUM CHLORIDE 0.9 % IV SOLN: 250.0000 mL | INTRAVENOUS | Status: DC | PRN

## 2013-01-16 MED ORDER — HEPARIN (PORCINE) IN NACL 100-0.45 UNIT/ML-% IJ SOLN
1100.0000 [IU]/h | INTRAMUSCULAR | Status: DC
  Administered 2013-01-16: 1100 [IU]/h via INTRAVENOUS
  Filled 2013-01-16 (×3): qty 250

## 2013-01-16 MED ORDER — DILTIAZEM HCL 25 MG/5ML IV SOLN: 10.0000 mg | Freq: Once | INTRAVENOUS | Status: DC

## 2013-01-16 MED ORDER — SODIUM CHLORIDE 0.9 % IJ SOLN: 3.0000 mL | Freq: Two times a day (BID) | INTRAMUSCULAR | Status: DC

## 2013-01-16 MED ORDER — ASPIRIN 81 MG PO CHEW
324.0000 mg | CHEWABLE_TABLET | Freq: Once | ORAL | Status: AC
  Administered 2013-01-16: 324 mg via ORAL
  Filled 2013-01-16: qty 4

## 2013-01-16 MED ORDER — SODIUM CHLORIDE 0.9 % IJ SOLN
3.0000 mL | Freq: Two times a day (BID) | INTRAMUSCULAR | Status: DC
  Administered 2013-01-16 – 2013-01-21 (×8): 3 mL via INTRAVENOUS

## 2013-01-16 MED ORDER — MORPHINE SULFATE 2 MG/ML IJ SOLN: 2.0000 mg | INTRAMUSCULAR | Status: DC | PRN

## 2013-01-16 MED ORDER — IPRATROPIUM BROMIDE 0.02 % IN SOLN
RESPIRATORY_TRACT | Status: AC
  Administered 2013-01-16: 1 mg
  Filled 2013-01-16: qty 5

## 2013-01-16 MED ORDER — ATORVASTATIN CALCIUM 40 MG PO TABS
40.0000 mg | ORAL_TABLET | Freq: Every day | ORAL | Status: DC
  Administered 2013-01-17 – 2013-01-22 (×6): 40 mg via ORAL
  Filled 2013-01-16 (×8): qty 1

## 2013-01-16 MED ORDER — PANTOPRAZOLE SODIUM 40 MG PO TBEC
40.0000 mg | DELAYED_RELEASE_TABLET | Freq: Every day | ORAL | Status: DC
  Administered 2013-01-16 – 2013-01-21 (×7): 40 mg via ORAL
  Filled 2013-01-16 (×6): qty 1

## 2013-01-16 MED ORDER — SODIUM CHLORIDE 0.9 % IV SOLN: INTRAVENOUS | Status: DC

## 2013-01-16 MED ORDER — VITAMIN B-1 100 MG PO TABS
100.0000 mg | ORAL_TABLET | Freq: Every day | ORAL | Status: DC
  Administered 2013-01-18 – 2013-01-22 (×4): 100 mg via ORAL
  Filled 2013-01-16 (×6): qty 1

## 2013-01-16 MED ORDER — FUROSEMIDE 10 MG/ML IJ SOLN
40.0000 mg | Freq: Once | INTRAMUSCULAR | Status: AC
  Administered 2013-01-16: 40 mg via INTRAVENOUS

## 2013-01-16 MED ORDER — GUAIFENESIN-DM 100-10 MG/5ML PO SYRP
5.0000 mL | ORAL_SOLUTION | ORAL | Status: DC | PRN
  Administered 2013-01-19: 5 mL via ORAL
  Filled 2013-01-16: qty 5

## 2013-01-16 MED ORDER — CLONIDINE HCL 0.1 MG PO TABS
0.1000 mg | ORAL_TABLET | Freq: Two times a day (BID) | ORAL | Status: DC
  Filled 2013-01-16: qty 1

## 2013-01-16 MED ORDER — SODIUM CHLORIDE 0.9 % IJ SOLN: 3.0000 mL | INTRAMUSCULAR | Status: DC | PRN

## 2013-01-16 MED ORDER — FERROUS SULFATE 325 (65 FE) MG PO TABS
325.0000 mg | ORAL_TABLET | Freq: Every day | ORAL | Status: DC
  Administered 2013-01-18 – 2013-01-22 (×5): 325 mg via ORAL
  Filled 2013-01-16 (×8): qty 1

## 2013-01-16 MED ORDER — THIAMINE HCL 100 MG/ML IJ SOLN
100.0000 mg | Freq: Every day | INTRAMUSCULAR | Status: DC
  Administered 2013-01-19: 100 mg via INTRAVENOUS
  Filled 2013-01-16 (×3): qty 1

## 2013-01-16 MED ORDER — SODIUM CHLORIDE 0.9 % IV SOLN
250.0000 mL | INTRAVENOUS | Status: DC | PRN
  Administered 2013-01-16: 10 mL/h via INTRAVENOUS

## 2013-01-16 MED ORDER — ADULT MULTIVITAMIN W/MINERALS CH
1.0000 | ORAL_TABLET | Freq: Every day | ORAL | Status: DC
  Administered 2013-01-18 – 2013-01-22 (×5): 1 via ORAL
  Filled 2013-01-16 (×7): qty 1

## 2013-01-16 MED ORDER — DILTIAZEM HCL 100 MG IV SOLR
5.0000 mg/h | INTRAVENOUS | Status: DC
  Administered 2013-01-16: 15 mg/h via INTRAVENOUS
  Administered 2013-01-16: 5 mg/h via INTRAVENOUS
  Administered 2013-01-17 (×2): 10 mg/h via INTRAVENOUS
  Filled 2013-01-16: qty 100

## 2013-01-16 MED ORDER — LORAZEPAM 1 MG PO TABS
1.0000 mg | ORAL_TABLET | Freq: Four times a day (QID) | ORAL | Status: AC | PRN
  Administered 2013-01-17 – 2013-01-18 (×4): 1 mg via ORAL
  Filled 2013-01-16 (×4): qty 1

## 2013-01-16 MED ORDER — DEXTROSE 5 % IV SOLN
3.0000 mg | Freq: Once | INTRAVENOUS | Status: AC
  Administered 2013-01-16: 3 mg via INTRAVENOUS
  Filled 2013-01-16 (×2): qty 0.3

## 2013-01-16 MED ORDER — FUROSEMIDE 10 MG/ML IJ SOLN
40.0000 mg | Freq: Once | INTRAMUSCULAR | Status: AC
  Administered 2013-01-16: 40 mg via INTRAVENOUS
  Filled 2013-01-16: qty 4

## 2013-01-16 MED ORDER — ASPIRIN 81 MG PO CHEW
324.0000 mg | CHEWABLE_TABLET | ORAL | Status: AC
  Administered 2013-01-17: 324 mg via ORAL
  Filled 2013-01-16: qty 3
  Filled 2013-01-16: qty 4

## 2013-01-16 MED ORDER — HYDROCODONE-ACETAMINOPHEN 5-325 MG PO TABS: 1.0000 | ORAL_TABLET | ORAL | Status: DC | PRN

## 2013-01-16 MED ORDER — FOLIC ACID 1 MG PO TABS
1.0000 mg | ORAL_TABLET | Freq: Every day | ORAL | Status: DC
  Administered 2013-01-18 – 2013-01-22 (×5): 1 mg via ORAL
  Filled 2013-01-16 (×7): qty 1

## 2013-01-16 MED ORDER — LEVALBUTEROL HCL 0.63 MG/3ML IN NEBU
0.6300 mg | INHALATION_SOLUTION | Freq: Four times a day (QID) | RESPIRATORY_TRACT | Status: DC | PRN
  Administered 2013-01-17 – 2013-01-18 (×2): 0.63 mg via RESPIRATORY_TRACT
  Filled 2013-01-16 (×3): qty 3

## 2013-01-16 MED ORDER — LORAZEPAM 2 MG/ML IJ SOLN
1.0000 mg | Freq: Four times a day (QID) | INTRAMUSCULAR | Status: AC | PRN
  Administered 2013-01-16 – 2013-01-17 (×3): 1 mg via INTRAVENOUS
  Filled 2013-01-16 (×3): qty 1

## 2013-01-16 MED ORDER — ACETAMINOPHEN 325 MG PO TABS
650.0000 mg | ORAL_TABLET | Freq: Four times a day (QID) | ORAL | Status: DC | PRN
  Administered 2013-01-19 – 2013-01-21 (×2): 650 mg via ORAL
  Filled 2013-01-16 (×2): qty 2

## 2013-01-16 MED ORDER — FUROSEMIDE 10 MG/ML IJ SOLN
INTRAMUSCULAR | Status: AC
  Filled 2013-01-16: qty 4

## 2013-01-16 MED ORDER — SODIUM CHLORIDE 0.9 % IJ SOLN
3.0000 mL | Freq: Two times a day (BID) | INTRAMUSCULAR | Status: DC
  Administered 2013-01-16: 3 mL via INTRAVENOUS

## 2013-01-16 MED ORDER — ACETAMINOPHEN 650 MG RE SUPP: 650.0000 mg | Freq: Four times a day (QID) | RECTAL | Status: DC | PRN

## 2013-01-16 MED ORDER — ASPIRIN 81 MG PO CHEW
324.0000 mg | CHEWABLE_TABLET | Freq: Once | ORAL | Status: AC
  Administered 2013-01-16: 324 mg via ORAL
  Filled 2013-01-16: qty 1

## 2013-01-16 MED ORDER — SODIUM CHLORIDE 0.9 % IV SOLN: 1.0000 mL/kg/h | INTRAVENOUS | Status: DC

## 2013-01-16 MED ORDER — SODIUM CHLORIDE 0.9 % IV SOLN
250.0000 mL | INTRAVENOUS | Status: DC | PRN
  Administered 2013-01-20: 250 mL via INTRAVENOUS

## 2013-01-16 NOTE — ED Provider Notes (Signed)
History    CSN: [id number] Arrival date & time 01/16/13  4098  First MD Initiated Contact with Patient 01/16/13 0539     Chief Complaint  Patient presents with  . Respiratory Distress   (Consider location/radiation/quality/duration/timing/severity/associated sxs/prior Treatment) HPI HPI Comments: [name]. is a 67 y.o. male with a h/o COPD, GERD, ASCVD, CHF brought in by ambulance, who presents to the Emergency Department complaining of shortness of breath that has been getting progressively worse over the last two days. He has been using his inhaler frequently.He also took his reflux medicine. This morning he became short of breath and felt pressure on the left side of his chest and down his left arm.  Denies fever, chills, nausea, vomiting.   PCP Dr. Juanetta Gosling  Cardiology Dr. Dietrich Pates Past Medical History  Diagnosis Date  . Hypertension   . Arteriosclerotic cardiovascular disease (ASCVD) 2003    CABG/MVR/AVR-2003 (#25 St. Jude/#21 St. Jude); anticoagulation; negative stress nuclear-2005  . Epistaxis     requiring catery & aterial ligation-09/2009  . GERD (gastroesophageal reflux disease)   . Hyperlipemia   . Abnormal LFTs     possible cirrhosis  . Chronic anticoagulation   . Valvular heart disease     S/P AVR/MVR  . Tobacco abuse     45 pack years  . Fasting hyperglycemia   . Nephrolithiasis   . Anemia     hemolytic anemia  . Diverticulosis   . Hyponatremia   . CHF (congestive heart failure)   . Chronic renal disease   . Hemolytic anemia     history of  . ETOH abuse    Past Surgical History  Procedure Laterality Date  . Endocopic sphenopalatine artery ligation & cautry    . Inguinal hernia repair      Left & right  . Coronary artery bypass graft  11/2001    [facility]  . Cardiac valve replacement  11/2001    AVR and MVR-St. Jude devices  . Cataract extraction w/phaco  01/20/2011    Procedure: CATARACT EXTRACTION PHACO AND INTRAOCULAR LENS  PLACEMENT (IOC);  Surgeon: Loraine Leriche T. Nile Riggs;  Location: [facility] ORS;  Service: Ophthalmology;  Laterality: Right;  CDE: 8.51  . Cataract extraction w/phaco  02/03/2011    Procedure: CATARACT EXTRACTION PHACO AND INTRAOCULAR LENS PLACEMENT (IOC);  Surgeon: Loraine Leriche T. Nile Riggs;  Location: [facility] ORS;  Service: Ophthalmology;  Laterality: Left;  CDE:10.01  . Colonoscopy  04/05/02; 08/2011    friable anal canal hemorrhoids otherwise normal; 2 diminutive polyps excised, minimal diverticulosis noted  . Esophagogastroduodenoscopy  01/2002    Dr. Karilyn Cota, submucosal esophageal lesion c/w leiomyoma  . Colonoscopy  09/02/2011    Procedure: COLONOSCOPY;  Surgeon: Corbin Ade, MD;  Location: [facility] END[facility] SUITE;  Service: Endoscopy;  Laterality: N/A;  8:15   Family History  Problem Relation Age of Onset  . Hypotension Neg Hx   . Anesthesia problems Neg Hx   . Malignant hyperthermia Neg Hx   . Pseudochol deficiency Neg Hx   . Colon cancer Neg Hx   . Liver disease Neg Hx    History  Substance Use Topics  . Smoking status: Current Every Day Smoker -- 1.00 packs/day for 52 years    Types: Cigarettes  . Smokeless tobacco: Never Used  . Alcohol Use: 4.2 oz/week    6 Cans of beer, 1 Shots of liquor per week    Review of Systems  Constitutional: Negative for fever.  10 Systems reviewed and are negative for acute change except as noted in the HPI.  HENT: Negative for congestion.   Eyes: Negative for discharge and redness.  Respiratory: Positive for shortness of breath and wheezing. Negative for cough.   Cardiovascular: Negative for chest pain.  Gastrointestinal: Negative for vomiting and abdominal pain.  Musculoskeletal: Negative for back pain.  Skin: Negative for rash.  Neurological: Negative for syncope, numbness and headaches.  Psychiatric/Behavioral:       No behavior change.    Allergies  Review of patient's allergies indicates no known allergies.  Home Medications   Current Outpatient Rx  Name  Route   Sig  Dispense  Refill  . acetaminophen (TYLENOL) 500 MG tablet   Oral   Take 1,000 mg by mouth every 6 (six) hours as needed. For pain          . EXPIRED: albuterol (PROVENTIL) (2.5 MG/3ML) 0.083% nebulizer solution   Nebulization   Take 3 mLs (2.5 mg total) by nebulization every 6 (six) hours as needed for wheezing.   75 mL   12   . amLODipine (NORVASC) 10 MG tablet   Oral   Take 1 tablet (10 mg total) by mouth at bedtime.   30 tablet   6   . aspirin 81 MG EC tablet   Oral   Take 81 mg by mouth daily.           Marland Kitchen atorvastatin (LIPITOR) 40 MG tablet      TAKE ONE TABLET BY MOUTH EVERY DAY. **THIS REPLACES SIMVASTATIN**   30 tablet   11   . cloNIDine (CATAPRES) 0.1 MG tablet      TAKE ONE TABLET BY MOUTH TWICE DAILY   180 tablet   3   . EXPIRED: furosemide (LASIX) 40 MG tablet   Oral   Take 1 tablet (40 mg total) by mouth 2 (two) times daily.   60 tablet   5   . HYDROcodone-acetaminophen (LORTAB) 10-500 MG per tablet   Oral   Take 1 tablet by mouth 4 times daily as needed.         Marland Kitchen lisinopril (PRINIVIL,ZESTRIL) 20 MG tablet      TAKE ONE TABLET BY MOUTH TWICE DAILY   180 tablet   3   . metoprolol (LOPRESSOR) 50 MG tablet      TAKE ONE TABLET BY MOUTH TWICE DAILY   180 tablet   0   . omeprazole (PRILOSEC) 20 MG capsule   Oral   Take 20 mg by mouth 2 (two) times daily.          Marland Kitchen EXPIRED: potassium chloride SA (K-DUR,KLOR-CON) 20 MEQ tablet   Oral   Take 1 tablet (20 mEq total) by mouth 2 (two) times daily.   60 tablet   5   . warfarin (COUMADIN) 5 MG tablet      TAKE ONE TABLET BY MOUTH ONCE DAILY EXCEPT  TAKE  ONE  AND  ONE-HALF  TABLET  2  DAYS  PER  WEEK   45 tablet   3    SpO2 95% Physical Exam  Nursing note and vitals reviewed. Constitutional: He appears well-developed and well-nourished.  Awake, alert, nontoxic appearance.  HENT:  Head: Normocephalic and atraumatic.  Scab over right eye and bridge of nose from fall 4 days  ago  Eyes: EOM are normal. Pupils are equal, round, and reactive to light.  Neck: Neck supple.  Cardiovascular:  Irregularly irregular, click present  Pulmonary/Chest: Effort normal. He has wheezes. He exhibits no tenderness.  Poor air movement, accessory muscle use  Abdominal: Soft. There is no tenderness. There is no rebound.  Musculoskeletal: He exhibits no tenderness.  Baseline ROM, no obvious new focal weakness.  Neurological:  Mental status and motor strength appears baseline for patient and situation.  Skin: No rash noted.  Bruising to left arm  Psychiatric: He has a normal mood and affect.    ED Course  Procedures (including critical care time) Results for orders placed during the hospital encounter of 01/16/13  CBC WITH DIFFERENTIAL      Result Value Range   WBC 6.3  4.0 - 10.5 K/uL   RBC 3.97 (*) 4.22 - 5.81 MIL/uL   Hemoglobin 11.2 (*) 13.0 - 17.0 g/dL   HCT 52.8 (*) 41.3 - 24.4 %   MCV 84.1  78.0 - 100.0 fL   MCH 28.2  26.0 - 34.0 pg   MCHC 33.5  30.0 - 36.0 g/dL   RDW 01.0 (*) 27.2 - 53.6 %   Platelets 298  150 - 400 K/uL   Neutrophils Relative % 82 (*) 43 - 77 %   Neutro Abs 5.2  1.7 - 7.7 K/uL   Lymphocytes Relative 8 (*) 12 - 46 %   Lymphs Abs 0.5 (*) 0.7 - 4.0 K/uL   Monocytes Relative 7  3 - 12 %   Monocytes Absolute 0.4  0.1 - 1.0 K/uL   Eosinophils Relative 2  0 - 5 %   Eosinophils Absolute 0.1  0.0 - 0.7 K/uL   Basophils Relative 1  0 - 1 %   Basophils Absolute 0.0  0.0 - 0.1 K/uL  BASIC METABOLIC PANEL      Result Value Range   Sodium 127 (*) 135 - 145 mEq/L   Potassium 4.6  3.5 - 5.1 mEq/L   Chloride 91 (*) 96 - 112 mEq/L   CO2 27  19 - 32 mEq/L   Glucose, Bld 182 (*) 70 - 99 mg/dL   BUN 24 (*) 6 - 23 mg/dL   Creatinine, Ser 6.44  0.50 - 1.35 mg/dL   Calcium 8.9  8.4 - 03.4 mg/dL   GFR calc non Af Amer 69 (*) >90 mL/min   GFR calc Af Amer 80 (*) >90 mL/min  TROPONIN I      Result Value Range   Troponin I 0.37 (*) <0.30 ng/mL  PROTIME-INR       Result Value Range   Prothrombin Time 26.0 (*) 11.6 - 15.2 seconds   INR 2.48 (*) 0.00 - 1.49    Dg Chest Port 1 View  01/16/2013   *RADIOLOGY REPORT*  Clinical Data: Shortness of breath.  PORTABLE CHEST - 1 VIEW  Comparison: Chest radiograph from 05/25/2011  Findings: The lungs are well expanded.  Bilateral airspace opacification is noted, particularly at the lower lung zones.  This may reflect pneumonia or pulmonary edema.  Small bilateral pleural effusions are seen.  No pneumothorax is seen.  Underlying vascular congestion is noted.  The cardiomediastinal silhouette is borderline normal in size.  The patient is status post median sternotomy.  A mitral valve replacement is noted.  Calcification is noted in the aortic arch. No acute osseous abnormalities are seen.  IMPRESSION: Bilateral airspace opacification, particularly at the lower lung zones.  This may reflect pneumonia or pulmonary edema.  Underlying vascular congestion noted.  Small bilateral pleural effusions seen.   Original Report Authenticated By: Tonia Ghent, M.D.  Date: 01/16/2013   0640  Rate: 136  Rhythm: atrial fibrillation and with a rapid ventricular response  QRS Axis: normal  Intervals: atrial fib  ST/T Wave abnormalities: ST depressions inferiorly and ST depressions laterally  Conduction Disutrbances:none  Narrative Interpretation:   Old EKG Reviewed: changed since 03/22/12. Now atrial fibrillation with ST depression inferolateral leads.  MDM  Patient presents with shortness of breath and left chest discomfort and left arm discomfort. Given continuous albuterol neb with improvement in shortness of breath. Chest and arm pain resolved. He has been given aspirin. First troponin returned 0.37, very slightly positive. Second troponin is scheduled for 9 AM. EKG with atrial fibrillation and ST depression in the infero-lateral leads. No previous EKGs with atrial fibrillation. Rate is between 136-150 after the nebulized  treatment. Care/disposition  of patient to Dr. Jodelle Gross.   MDM Reviewed: nursing note and vitals Interpretation: labs, ECG and x-ray         Nicoletta Dress. Colon Branch, MD 01/16/13 [phone]

## 2013-01-16 NOTE — ED Notes (Signed)
Cardizem increased to 10mg /h.

## 2013-01-16 NOTE — ED Provider Notes (Addendum)
Patient seen by me. Discussed with [facility] here again he can we both agree that most likely require transfer her down to [facility] due to the elevated troponin and the new onset of atrial fib. Discussed with [facility] cardiology Dr. Tenny Craw who is willing to consult. [facility] will make arrangements to get the patient transferred to the hospitalist team at [facility]. Cardiology is stating probably needs an echo. They agreed with the cardia exam. Patient also given Lasix 40 mg IV for the fluid in the lungs. Patient's breathing has improved significantly with the hour-long med that he got when he first got here. However he still has an oxygen requirement satting 90% 91% on 2 L. Normally not on oxygen. Patient has not had frequent admissions to the hospital. Last seen by cardiology a year ago.  In summary patient seemed to present with an exacerbation of COPD with lots of wheezing that improved with an hour-long neb. Unable to tell whether patient had atrial fib when he first arrived or whether the tachycardia came on because of the hour-long neb but at 640 EKG shows atrial fib with a heart rate of 136. Current heart rate still around 1:30 prior to the diltiazem a.m. Patient with there for new onset atrial fib patient has mild elevation in troponin which will need to have further rule out. Patient also seemed to have an exacerbation of COPD. Patient seems to have bilateral fluid in the lungs which could represent pulmonary edema developing. Patient is on Coumadin his INR is therapeutic.  Patient will be transferred to [facility] hospitalist service with consult from [facility] cardiology there.   CRITICAL CARE Performed by: Shelda Jakes. Total critical care time: 30 Critical care time was exclusive of separately billable procedures and treating other patients. Critical care was necessary to treat or prevent imminent or life-threatening deterioration. Critical care was time spent personally by me on the following  activities: development of treatment plan with patient and/or surrogate as well as nursing, discussions with consultants, evaluation of patient's response to treatment, examination of patient, obtaining history from patient or surrogate, ordering and performing treatments and interventions, ordering and review of laboratory studies, ordering and review of radiographic studies, pulse oximetry and re-evaluation of patient's condition.       Shelda Jakes, MD 01/16/13 [phone]   Will repeat troponin was also seen by internal medicine significantly elevated review of original EKG showed the wound looks like atrial fib with a heart rate of 136 had ST segment depression anteriorly and laterally but no ST segment elevation some of this was thought maybe to be rate related in retrospect probably consistent with acute coronary syndrome event. Patient has remained stable transfer to [facility] is been arranged to step down and patient will also be consult on by cardiology. In retrospect the shortness of breath and may have not been the main complaint may have not been a COPD exacerbation however we were not here to listen to him when he first arrived. It clearly seems to have a cardiac event.    Shelda Jakes, MD 01/16/13 [phone]

## 2013-01-16 NOTE — ED Notes (Signed)
Dr. Colon Branch in room to reassess patient. Patient now reports to Dr. Colon Branch that he also experienced some chest pressure this morning accompanied by left arm tingling.

## 2013-01-16 NOTE — ED Notes (Signed)
CRITICAL VALUE ALERT  Critical value received:  Troponin 0.37  Date of notification:  07/14//14  Time of notification:  0637  Critical value read back:Yes  Nurse who received alert:  Kathlene Cote, RN  MD notified (1st page):  Dr. Colon Branch  Time of first page:  [phone]  Responding MD:  Dr. Colon Branch

## 2013-01-16 NOTE — ED Notes (Signed)
CRITICAL VALUE ALERT  Critical value received: 4.60  Date of notification:  01/16/2013  Time of notification: 0946  Critical value read back: Troponin 4.60  Nurse who received alert: Garrison Columbus, RN  MD notified (1st page):  Joyce Gross, NP  Time of first page:  [phone]  MD notified (2nd page):  Time of second page:  Responding MD:  Joyce Gross, NP  Time MD responded:  [phone]

## 2013-01-16 NOTE — H&P (Signed)
Patient seen, independently examined and chart reviewed. I agree with exam, assessment and plan discussed with Toya Smothers, NP.  There is a 67 year old man with history of well-controlled COPD as well as CABG, MVR, aVR 2003 who presents the emergency department today with complaint of chest pain and shortness of breath. He was up this morning around 2:30 AM to urinate and then developed chest pressure, nausea, left arm pain and shortness of breath. No palpitations were noted at that time. He tried to lay back down but the pressure worsened. He came to the emergency department where initial impression was COPD exacerbation, treated with albuterol, Atrovent.  initial EKG by report showed atrial fibrillation with rapid ventricular response, currently not available for review. Initial troponin was 0.37, chest x-ray suggested mild failure, BNP was elevated, INR therapeutic. EDP contacted me, transferred [facility] was recommended.  The patient has no history of atrial fibrillation and has felt quite well lately with no recent chest pain or shortness of breath. He has received a 10 mg dose of IV diltiazem and his heart rate has improved from 140s to 1/10. He feels much better, has no chest pressure is breathing better. He appears calm and mildly uncomfortable on examination. Nontoxic. Cardiovascular irregular, tachycardic. No murmur, rub, gallop. No lower extremity edema. Respiratory clear to auscultation bilaterally with decreased breath sounds but no frank wheezes, rales, rhonchi. Mild increased respiratory effort. Abdomen was soft.  Subsequent troponin 4.6. Patient has received aspirin, Lasix and is currently on diltiazem infusion. His INR is therapeutic on warfarin. He is currently pain-free.  History and clinical data highly concerning for acute coronary syndrome which likely has precipitated atrial fibrillation with rapid ventricular response (rather than atrial fibrillation having caused troponin leak).  Discussed with Dr. Deretha Emory, plan transferred [facility]. He has discussed the case with Dr. Tenny Craw who will see in consultation for cardiology. The patient be transferred to [facility] team 1.  In the meanwhile continue treatment for atrial fibrillation with rapid ventricular response, NSTEMI and monitor clinical condition closely. His blood pressure is normal but on the low side, hold clonidine for now as well as lisinopril. Monitor for alcohol withdrawal.  Brendia Sacks, MD [facility] [phone]

## 2013-01-16 NOTE — ED Notes (Signed)
Pt from home with c/o resp distress, denies cp or other complaints.

## 2013-01-16 NOTE — ED Notes (Signed)
Patient heart rate between 140-150. Oxygen saturation at 89% on 4 liters via nasal canula. Dr. Colon Branch notified.

## 2013-01-16 NOTE — H&P (Signed)
[facility] History and Physical  Bhargav Barbaro. KVQ:[id number] DOB: 1946-05-27 DOA: 01/16/2013  Referring physician:  PCP: Fredirick Maudlin, MD  Specialists:   Chief Complaint: Shortness of breath chest pain  HPI: [name]. is a 67 y.o. male with past medical history that includes CAD and valvular heart disease with alpha replacement in 2003 on chronic Coumadin, COPD, hyperlipidemia, hypertension, chronic kidney disease, EtOH use, tobacco use resides to the emergency department with a chief complaint of shortness of breath and chest pain. Information is obtained from the patient and his sister-in-law who is at the bedside. States he was in his usual state of health this morning when he got up and ambulated to the bathroom. He developed sudden shortness of breath along with left anterior chest pressure that radiated to his left shoulder and left arm. He states that his arm was slightly weak with some tingling. Associated symptoms include diaphoresis nausea without vomiting. He states he used his inhaler and his home nebulizer, without improvement. He also reports that he took his reflux medicine. He states he became frightened when his respiratory status did not improve. He does indicate that the chest pressure went from a 9/10 to a 6/10 during this time. He denies abdominal pain diarrhea dysuria hematuria frequency or urgency. He denies fever chills recent sick contacts. In the emergency department he was given continuous albuterol neb with improvement in his shortness of breath. In addition in the emergency room his chest and arm pain resolved. Is also given aspirin. His first troponin returned as 0.37. His EKG in the emergency department yields atrial fibrillation with ST depression in the inferior lateral leads. His rate was between 136 and 150 after the nebulized treatment. Chest x-ray yields bilateral airspace opacification, particularly at the lower lung zones. This may reflect  pneumonia or pulmonary edema. Underlying vascular congestion noted. Small bilateral pleural effusions seen. In addition the patient's proBNP 2446.0. He was given 40 mg of Lasix IV in the emergency department. Patient's second troponin 4.60. This was also given Cardizem bolus 10 mg his heart rate came down to 94 a few minutes and gradually increased to the 127 range. Due to the elevated troponin in the new-onset of A. fib patient is being transferred to in with a cardiology consultation. Dr. Tenny Craw has been made aware of patient and updated lab values. Symptoms came on suddenly persisted characterized as severe.   Review of Systems: The patient denies anorexia, fever, weight loss,, vision loss, decreased hearing, hoarseness,  syncope, , peripheral edema, balance deficits, hemoptysis, abdominal pain, melena, hematochezia, severe indigestion/heartburn, hematuria, incontinence, genital sores, muscle weakness, suspicious skin lesions, transient blindness, difficulty walking, depression, unusual weight change, abnormal bleeding, enlarged lymph nodes, angioedema, and breast masses.    Past Medical History  Diagnosis Date  . Hypertension   . Arteriosclerotic cardiovascular disease (ASCVD) 2003    CABG/MVR/AVR-2003 (#25 St. Jude/#21 St. Jude); anticoagulation; negative stress nuclear-2005  . Epistaxis     requiring catery & aterial ligation-09/2009  . GERD (gastroesophageal reflux disease)   . Hyperlipemia   . Abnormal LFTs     possible cirrhosis  . Chronic anticoagulation   . Valvular heart disease     S/P AVR/MVR  . Tobacco abuse     45 pack years  . Fasting hyperglycemia   . Nephrolithiasis   . Anemia     hemolytic anemia  . Diverticulosis   . Hyponatremia   . CHF (congestive heart failure)   . Chronic  renal disease   . Hemolytic anemia     history of  . ETOH abuse    Past Surgical History  Procedure Laterality Date  . Endocopic sphenopalatine artery ligation & cautry    . Inguinal hernia  repair      Left & right  . Coronary artery bypass graft  11/2001    [facility]  . Cardiac valve replacement  11/2001    AVR and MVR-St. Jude devices  . Cataract extraction w/phaco  01/20/2011    Procedure: CATARACT EXTRACTION PHACO AND INTRAOCULAR LENS PLACEMENT (IOC);  Surgeon: Loraine Leriche T. Nile Riggs;  Location: [facility] ORS;  Service: Ophthalmology;  Laterality: Right;  CDE: 8.51  . Cataract extraction w/phaco  02/03/2011    Procedure: CATARACT EXTRACTION PHACO AND INTRAOCULAR LENS PLACEMENT (IOC);  Surgeon: Loraine Leriche T. Nile Riggs;  Location: [facility] ORS;  Service: Ophthalmology;  Laterality: Left;  CDE:10.01  . Colonoscopy  04/05/02; 08/2011    friable anal canal hemorrhoids otherwise normal; 2 diminutive polyps excised, minimal diverticulosis noted  . Esophagogastroduodenoscopy  01/2002    Dr. Karilyn Cota, submucosal esophageal lesion c/w leiomyoma  . Colonoscopy  09/02/2011    Procedure: COLONOSCOPY;  Surgeon: Corbin Ade, MD;  Location: [facility] SU[facility];  Service: Endoscopy;  Laterality: N/A;  8:15   Social History: Patient is an active smoker. He drinks approximately 10 12 ounce beers daily. He lives alone as his wife died 6 years ago. He is a retired Conservation officer, historic buildings. He denies any other drug use.  No Known Allergies  Family History  Problem Relation Age of Onset  . Hypotension Neg Hx   . Anesthesia problems Neg Hx   . Malignant hyperthermia Neg Hx   . Pseudochol deficiency Neg Hx   . Colon cancer Neg Hx   . Liver disease Neg Hx    agents mother is deceased at 58 years of age from a CVA. Calcified COPD and hypertension. Father is deceased in his 36s from COPD. He has 2 siblings and their collective medical history is positive for COPD and A. fib.  Prior to Admission medications   Medication Sig Start Date End Date Taking? Authorizing Provider  acetaminophen (TYLENOL) 500 MG tablet Take 1,000 mg by mouth every 6 (six) hours as needed. For pain   Yes Historical Provider, MD  albuterol (PROVENTIL  HFA;VENTOLIN HFA) 108 (90 BASE) MCG/ACT inhaler Inhale 2 puffs into the lungs every 6 (six) hours as needed for wheezing.   Yes Historical Provider, MD  albuterol (PROVENTIL) (2.5 MG/3ML) 0.083% nebulizer solution Take 2.5 mg by nebulization every 6 (six) hours as needed for wheezing. 05/26/11 01/16/13 Yes Fredirick Maudlin, MD  amLODipine (NORVASC) 10 MG tablet Take 10 mg by mouth at bedtime. 11/23/12  Yes Kathlen Brunswick, MD  aspirin 81 MG EC tablet Take 81 mg by mouth daily.     Yes Historical Provider, MD  atorvastatin (LIPITOR) 40 MG tablet Take 40 mg by mouth daily.   Yes Historical Provider, MD  cloNIDine (CATAPRES) 0.1 MG tablet Take 0.1 mg by mouth 2 (two) times daily.   Yes Historical Provider, MD  ferrous sulfate 325 (65 FE) MG EC tablet Take 325 mg by mouth daily with breakfast.   Yes Historical Provider, MD  furosemide (LASIX) 40 MG tablet Take 40 mg by mouth 2 (two) times daily. 05/26/11 01/16/13 Yes Fredirick Maudlin, MD  HYDROcodone-acetaminophen (LORTAB) 10-500 MG per tablet Take 1 tablet by mouth 4 times daily as needed for pain.  03/07/12  Yes Historical Provider, MD  lisinopril (PRINIVIL,ZESTRIL) 20 MG tablet Take 20 mg by mouth 2 (two) times daily.   Yes Historical Provider, MD  metoprolol (LOPRESSOR) 50 MG tablet Take 50 mg by mouth 2 (two) times daily.   Yes Historical Provider, MD  omeprazole (PRILOSEC) 20 MG capsule Take 40 mg by mouth daily.    Yes Historical Provider, MD  potassium chloride SA (K-DUR,KLOR-CON) 20 MEQ tablet Take 20 mEq by mouth 2 (two) times daily. 05/26/11 01/16/13 Yes Fredirick Maudlin, MD  warfarin (COUMADIN) 5 MG tablet Take 5-7.5 mg by mouth daily. Pt takes 7.5mg  on Monday and Thursday. All other days is 5 mg   Yes Historical Provider, MD   Physical Exam: Filed Vitals:   01/16/13 0817 01/16/13 0834 01/16/13 0935 01/16/13 0949  BP: 128/50 128/50 122/77 122/77  Pulse:  138 126 126  Temp:      TempSrc:      Resp: 24 30 24 24   SpO2: 91% 91% 94% 93%      General:  Well-nourished no acute distress somewhat ill appearing  Eyes: PE RRL, EOMI,  ENT: Ears clear nose somewhat mottled without drainage oropharynx without erythema or exudate mucous membranes of his mouth are moist and pink  Neck: Supple no JVD full range of motion no lymphadenopathy  Cardiovascular: Irregularly irregular positive click no murmur no rub no lower extremity edema  Respiratory: Mild increased work of breathing with conversation. Fair airflow. Breath sounds distant fine crackles in bilateral bases and we  Abdomen:   Skin:  Round soft positive bowel sounds nontender to palpation no mass organomegaly noted  Musculoskeletal: No joint swelling or tenderness full range of motion no clubbing  Psychiatric: Prep. Cooperative  Neurologic: Cranial nerves II through XII grossly intact speech clear facial  Labs on Admission:  Basic Metabolic Panel:  Recent Labs Lab 01/16/13 0554  NA 127*  K 4.6  CL 91*  CO2 27  GLUCOSE 182*  BUN 24*  CREATININE 1.08  CALCIUM 8.9   Liver Function Tests: No results found for this basename: AST, ALT, ALKPHOS, BILITOT, PROT, ALBUMIN,  in the last 168 hours No results found for this basename: LIPASE, AMYLASE,  in the last 168 hours No results found for this basename: AMMONIA,  in the last 168 hours CBC:  Recent Labs Lab 01/16/13 0554  WBC 6.3  NEUTROABS 5.2  HGB 11.2*  HCT 33.4*  MCV 84.1  PLT 298   Cardiac Enzymes:  Recent Labs Lab 01/16/13 0554 01/16/13 0852  TROPONINI 0.37* 4.60*    BNP (last 3 results)  Recent Labs  01/16/13 0656  PROBNP 2446.0*   CBG: No results found for this basename: GLUCAP,  in the last 168 hours  Radiological Exams on Admission: Dg Chest Port 1 View  01/16/2013   *RADIOLOGY REPORT*  Clinical Data: Shortness of breath.  PORTABLE CHEST - 1 VIEW  Comparison: Chest radiograph from 05/25/2011  Findings: The lungs are well expanded.  Bilateral airspace opacification is noted,  particularly at the lower lung zones.  This may reflect pneumonia or pulmonary edema.  Small bilateral pleural effusions are seen.  No pneumothorax is seen.  Underlying vascular congestion is noted.  The cardiomediastinal silhouette is borderline normal in size.  The patient is status post median sternotomy.  A mitral valve replacement is noted.  Calcification is noted in the aortic arch. No acute osseous abnormalities are seen.  IMPRESSION: Bilateral airspace opacification, particularly at the lower lung zones.  This may  reflect pneumonia or pulmonary edema.  Underlying vascular congestion noted.  Small bilateral pleural effusions seen.   Original Report Authenticated By: Tonia Ghent, M.D.    EKG: Independently reviewed. Atrial fibrillation rapid ventricular response at 1:36  Assessment/Plan Principal Problem:   Chest pain: Given critical troponin and clinical history likely MI. Will admit to step down unit at [place]. Initial troponin 0.34 second troponin this morning critical of 4.60. Time of my exam patient is chest pain free. He was given 324 mg of aspirin in the emergency department. Will continue oxygen support and provide pain medicine as needed. Will check lipids. 2-D echo ordered to be done after transfer. Continue home Lasix. Cardiology consult already arranged at [facility].  Active Problems:  Atrial fibrillation with RVR: New onset likely related to #1. Patient given 10 mg bolus of Cardizem in the emergency department. His heart rate came down to 90 for a few minutes then ebbed back to 127. Cardizem drip initiated to be titrated to heart rate. 2-D echo ordered. Cardiology consult requested. Patient already on Coumadin do to valvular replace 2003. Current INR 2.48. Will request to continue Coumadin per pharmacy    CHRONIC OBSTRUCTIVE PULMONARY DISEASE: Patient not on oxygen at home but does use inhalers and home nebulizer treatments. Initially sounded with a chief complaint of orifice of  breath with moderate wheezing. Provided with hour-long nebulizer. At time of my exam patient with mild increased work of breathing with conversation and fair airflow without wheeze. Will provide Xopenex nebs as needed. Continue oxygen support. His oxygen saturation level did drop in the emergency room to 89%. He does require 4 L of nasal cannula oxygen to maintain a saturation level greater than 90%.  CAD with valvular heart disease. 7 status post valve replacement 2003. Is on chronic Coumadin therapy. See #1 last seen in September of 2013.     HYPONATREMIA, CHRONIC: Patient with documented history of same. However baseline range seems to be 1:30 to 133.  Hypertension: Hold at present with systolic blood pressure range 409-811. Home medications include amlodipine Catapres Lasix lisinopril metoprolol. Patient received IV Lasix in the ED will continue Catapres.       GERD (gastroesophageal reflux disease): Stable at baseline.      HYPERLIPIDEMIA: Will check lipid panel. Continue statin.    ANEMIA: History of same. Current level within his baseline range. No signs symptoms of obvious bleeding. Will monitor closely. Patient is on Coumadin    TOBACCO ABUSE: offered counseling regarding cessation. EtOH use. This and states he drinks 10 12 ounce beers daily. Will provide CIWA protocol. No signs symptoms of withdrawal at this time.  Dr. Tenny Craw with cardiology at [facility].    Code Status: Full Family Communication: Brother and sister-in-law at bedside Disposition Plan: Home when ready  Time spent: 90 minutes  Gwenyth Bender [facility] Pager [phone]  If 7PM-7AM, please contact night-coverage [email or web address] Password [facility] 01/16/2013, 9:52 AM

## 2013-01-16 NOTE — ED Notes (Signed)
MD notified that patient's heart rate is ranging between 120-140 at this time.

## 2013-01-16 NOTE — ED Notes (Signed)
Called phramacy again concerning cardizem. They stated it should be coming soon, the tech has left with it.

## 2013-01-16 NOTE — Progress Notes (Signed)
ANTICOAGULATION CONSULT NOTE - Initial Consult  Pharmacy Consult for Heparin Indication: chest pain/ACS and h/o AVR/MVR/Afib  No Known Allergies  Patient Measurements: Height: 5\' 6"  (167.6 cm) Weight: 152 lb 8.9 oz (69.2 kg) IBW/kg (Calculated) : 63.8  Vital Signs: Temp: 98.1 F (36.7 C) (07/14 1940) Temp src: Oral (07/14 1940) BP: 164/72 mmHg (07/14 2200) Pulse Rate: 91 (07/14 2200)  Labs:  Recent Labs  01/16/13 0554 01/16/13 0852 01/16/13 1448  HGB 11.2*  --   --   HCT 33.4*  --   --   PLT 298  --   --   LABPROT 26.0*  --   --   INR 2.48*  --   --   CREATININE 1.08  --   --   TROPONINI 0.37* 4.60* 11.83*    Estimated Creatinine Clearance: 59.9 ml/min (by C-G formula based on Cr of 1.08).   Medical History: Past Medical History  Diagnosis Date  . Hypertension   . Arteriosclerotic cardiovascular disease (ASCVD) 2003    CABG/MVR/AVR-2003 (#25 St. Jude/#21 St. Jude); anticoagulation; negative stress nuclear-2005  . Epistaxis     requiring catery & aterial ligation-09/2009  . GERD (gastroesophageal reflux disease)   . Hyperlipemia   . Abnormal LFTs     possible cirrhosis  . Chronic anticoagulation   . Valvular heart disease     S/P AVR/MVR  . Tobacco abuse     45 pack years  . Fasting hyperglycemia   . Nephrolithiasis   . Anemia     hemolytic anemia  . Diverticulosis   . Hyponatremia   . CHF (congestive heart failure)   . Chronic renal disease   . Hemolytic anemia     history of  . ETOH abuse   . Coronary artery disease   . Myocardial infarction 01/16/2013    NSTEMI  . Anginal pain   . Dysrhythmia 01/16/2013    ATRIAL FIB WITH RVR  . COPD (chronic obstructive pulmonary disease)     CONTROLED  . Shortness of breath     Medications:  Prescriptions prior to admission  Medication Sig Dispense Refill  . acetaminophen (TYLENOL) 500 MG tablet Take 1,000 mg by mouth every 6 (six) hours as needed. For pain      . albuterol (PROVENTIL HFA;VENTOLIN  HFA) 108 (90 BASE) MCG/ACT inhaler Inhale 2 puffs into the lungs every 6 (six) hours as needed for wheezing.      Marland Kitchen albuterol (PROVENTIL) (2.5 MG/3ML) 0.083% nebulizer solution Take 2.5 mg by nebulization every 6 (six) hours as needed for wheezing.      Marland Kitchen amLODipine (NORVASC) 10 MG tablet Take 10 mg by mouth at bedtime.      Marland Kitchen aspirin 81 MG EC tablet Take 81 mg by mouth daily.        Marland Kitchen atorvastatin (LIPITOR) 40 MG tablet Take 40 mg by mouth daily.      . cloNIDine (CATAPRES) 0.1 MG tablet Take 0.1 mg by mouth 2 (two) times daily.      . ferrous sulfate 325 (65 FE) MG EC tablet Take 325 mg by mouth daily with breakfast.      . furosemide (LASIX) 40 MG tablet Take 40 mg by mouth 2 (two) times daily.      Marland Kitchen HYDROcodone-acetaminophen (LORTAB) 10-500 MG per tablet Take 1 tablet by mouth 4 times daily as needed for pain.       Marland Kitchen lisinopril (PRINIVIL,ZESTRIL) 20 MG tablet Take 20 mg by mouth 2 (two) times daily.      Marland Kitchen  metoprolol (LOPRESSOR) 50 MG tablet Take 50 mg by mouth 2 (two) times daily.      Marland Kitchen omeprazole (PRILOSEC) 20 MG capsule Take 40 mg by mouth daily.       . potassium chloride SA (K-DUR,KLOR-CON) 20 MEQ tablet Take 20 mEq by mouth 2 (two) times daily.      Marland Kitchen warfarin (COUMADIN) 5 MG tablet Take 5-7.5 mg by mouth daily. Pt takes 7.5mg  on Monday and Thursday. All other days is 5 mg        Assessment: 67 yo male with NSTEMI, ho AVR/MVR/Afib, for anticoagulation.  Coumadin on hold for cath tomorrow.  IV Vitamin K 3 mg ordered for INR reversal.  Will start heparin now as INR below goal.  Goal of Therapy:  Heparin level 0.3-0.7 units/ml Monitor platelets by anticoagulation protocol: Yes   Plan:  Start heparin 1100 units/hr Check heparin level in 8 hours.  Eddie Candle 01/16/2013,10:37 PM

## 2013-01-16 NOTE — ED Notes (Signed)
Pt currently denies pain. Pt states that PTA, he felt like something was sitting on his chest along with left arm pain.

## 2013-01-16 NOTE — Consult Note (Signed)
Patient ID: [name]. MRN: [id number], DOB/AGE: Nov 26, 1945   Admit date: 01/16/2013 Date of Consult: @TODAY @  Primary Physician: Fredirick Maudlin, MD Primary Cardiologist: [place]    Problem List: Past Medical History  Diagnosis Date  . Hypertension   . Arteriosclerotic cardiovascular disease (ASCVD) 2003    CABG/MVR/AVR-2003 (#25 St. Jude/#21 St. Jude); anticoagulation; negative stress nuclear-2005  . Epistaxis     requiring catery & aterial ligation-09/2009  . GERD (gastroesophageal reflux disease)   . Hyperlipemia   . Abnormal LFTs     possible cirrhosis  . Chronic anticoagulation   . Valvular heart disease     S/P AVR/MVR  . Tobacco abuse     45 pack years  . Fasting hyperglycemia   . Nephrolithiasis   . Anemia     hemolytic anemia  . Diverticulosis   . Hyponatremia   . CHF (congestive heart failure)   . Chronic renal disease   . Hemolytic anemia     history of  . ETOH abuse   . Coronary artery disease   . Myocardial infarction 01/16/2013    NSTEMI  . Anginal pain   . Dysrhythmia 01/16/2013    ATRIAL FIB WITH RVR  . COPD (chronic obstructive pulmonary disease)     CONTROLED  . Shortness of breath     Past Surgical History  Procedure Laterality Date  . Endocopic sphenopalatine artery ligation & cautry    . Inguinal hernia repair      Left & right  . Coronary artery bypass graft  11/2001    [facility]  . Cardiac valve replacement  11/2001    AVR and MVR-St. Jude devices  . Cataract extraction w/phaco  01/20/2011    Procedure: CATARACT EXTRACTION PHACO AND INTRAOCULAR LENS PLACEMENT (IOC);  Surgeon: Loraine Leriche T. Nile Riggs;  Location: [facility];  Service: Ophthalmology;  Laterality: Right;  CDE: 8.51  . Cataract extraction w/phaco  02/03/2011    Procedure: CATARACT EXTRACTION PHACO AND INTRAOCULAR LENS PLACEMENT (IOC);  Surgeon: Loraine Leriche T. Nile Riggs;  Location: [facility];  Service: Ophthalmology;  Laterality: Left;  CDE:10.01  . Colonoscopy  04/05/02; 08/2011   friable anal canal hemorrhoids otherwise normal; 2 diminutive polyps excised, minimal diverticulosis noted  . Esophagogastroduodenoscopy  01/2002    Dr. Karilyn Cota, submucosal esophageal lesion c/w leiomyoma  . Colonoscopy  09/02/2011    Procedure: COLONOSCOPY;  Surgeon: Corbin Ade, MD;  Location: [facility] END[facility] SUITE;  Service: Endoscopy;  Laterality: N/A;  8:15     Allergies: No Known Allergies  HPI:  Patient is a 67 yo who we are asked to see for elevated troponin, atrial fibrillation.  The patient has a history of CAD  Cath in 2003 showed 70% proximal LAD, 60% mid LCx  OM2 60^  RCA with 40 the% prox; diffuse 50% mid stensosis.  He also has a history of severe MS, severe AS.  He underwent CABG x 4 ( LIMA to LAD; SVG to Diag; SVG to OM; SVG to RCA) as well as AVR (21 mm St Jude [employer] valve) and MVR ( 25 mm St Jude valve).  He has not had a cardiac cath since.  He was last in cardicology clinic in September 2013.  Patient reports that he was feeling OK until this AM  While in bathroom developed sudden SOB and chest pressure  Pressure radiated to L shoulder and L arm  Arm wen numb  Associated with SOB Brought to [facility].  There given nebulizers, lasix  and cardiazem.  Found to be in rapid afib.   Over time symptoms improved.  The patient says his breathing is better but not at baseline  Denies palpitations.   Inpatient Medications:  . [START ON 01/17/2013] aspirin  324 mg Oral Pre-Cath  . atorvastatin  40 mg Oral Daily  . diltiazem  10 mg Intravenous Once  . [START ON 01/17/2013] ferrous sulfate  325 mg Oral Q breakfast  . folic acid  1 mg Oral Daily  . furosemide      . multivitamin with minerals  1 tablet Oral Daily  . pantoprazole  40 mg Oral Daily  . sodium chloride  3 mL Intravenous Q12H  . sodium chloride  3 mL Intravenous Q12H  . thiamine  100 mg Oral Daily   Or  . thiamine  100 mg Intravenous Daily    Family History  Problem Relation Age of Onset  . Hypotension Neg Hx   .  Anesthesia problems Neg Hx   . Malignant hyperthermia Neg Hx   . Pseudochol deficiency Neg Hx   . Colon cancer Neg Hx   . Liver disease Neg Hx      History   Social History  . Marital Status: Widowed    Spouse Name: N/A    Number of Children: 1  . Years of Education: N/A   Occupational History  . retired    Social History Main Topics  . Smoking status: Current Every Day Smoker -- 1.00 packs/day for 52 years    Types: Cigarettes  . Smokeless tobacco: Never Used     Comment: STATES HE IS CUTTING DOWN ON  SMOKING HIMSELF   . Alcohol Use: 4.2 oz/week    6 Cans of beer, 1 Shots of liquor per week     Comment: DAILY  . Drug Use: No  . Sexually Active: No   Other Topics Concern  . Not on file   Social History Narrative  . No narrative on file     Review of Systems: All other systems reviewed and are otherwise negative except as noted above.  Physical Exam: Filed Vitals:   01/16/13 1940  BP: 154/73  Pulse:   Temp: 98.1 F (36.7 C)  Resp:     Intake/Output Summary (Last 24 hours) at 01/16/13 2205 Last data filed at 01/16/13 2110  Gross per 24 hour  Intake    598 ml  Output    975 ml  Net   -377 ml    General: Well developed, well nourished, in no acute distress.  Appears older than stated age. Head: Normocephalic, atraumatic, sclera non-icteric Neck: Negative for carotid bruits. JVP not elevated. Lungs: Decreased airflow  Mild wheezing  Rales at R base.   Heart: Irreg rate and rhythm.  S1, S2.  Crisp valve sounds.   Abdomen: Soft, non-tender, non-distended with normoactive bowel sounds. No hepatomegaly. No rebound/guarding. No obvious abdominal masses. Msk:  Tremor R arm. Extremities: No clubbing, cyanosis or edema.  Distal pedal pulses are 2+ and equal bilaterally. Neuro: Alert and oriented X 3. Moves all extremities spontaneously. Psych:  Responds to questions appropriately with a normal affect.  Labs: Results for orders placed during the hospital  encounter of 01/16/13 (from the past 24 hour(s))  CBC WITH DIFFERENTIAL     Status: Abnormal   Collection Time    01/16/13  5:54 AM      Result Value Range   WBC 6.3  4.0 - 10.5 K/uL   RBC 3.97 (*)  4.22 - 5.81 MIL/uL   Hemoglobin 11.2 (*) 13.0 - 17.0 g/dL   HCT 11.9 (*) 14.7 - 82.9 %   MCV 84.1  78.0 - 100.0 fL   MCH 28.2  26.0 - 34.0 pg   MCHC 33.5  30.0 - 36.0 g/dL   RDW 56.2 (*) 13.0 - 86.5 %   Platelets 298  150 - 400 K/uL   Neutrophils Relative % 82 (*) 43 - 77 %   Neutro Abs 5.2  1.7 - 7.7 K/uL   Lymphocytes Relative 8 (*) 12 - 46 %   Lymphs Abs 0.5 (*) 0.7 - 4.0 K/uL   Monocytes Relative 7  3 - 12 %   Monocytes Absolute 0.4  0.1 - 1.0 K/uL   Eosinophils Relative 2  0 - 5 %   Eosinophils Absolute 0.1  0.0 - 0.7 K/uL   Basophils Relative 1  0 - 1 %   Basophils Absolute 0.0  0.0 - 0.1 K/uL  BASIC METABOLIC PANEL     Status: Abnormal   Collection Time    01/16/13  5:54 AM      Result Value Range   Sodium 127 (*) 135 - 145 mEq/L   Potassium 4.6  3.5 - 5.1 mEq/L   Chloride 91 (*) 96 - 112 mEq/L   CO2 27  19 - 32 mEq/L   Glucose, Bld 182 (*) 70 - 99 mg/dL   BUN 24 (*) 6 - 23 mg/dL   Creatinine, Ser 7.84  0.50 - 1.35 mg/dL   Calcium 8.9  8.4 - 69.6 mg/dL   GFR calc non Af Amer 69 (*) >90 mL/min   GFR calc Af Amer 80 (*) >90 mL/min  TROPONIN I     Status: Abnormal   Collection Time    01/16/13  5:54 AM      Result Value Range   Troponin I 0.37 (*) <0.30 ng/mL  PROTIME-INR     Status: Abnormal   Collection Time    01/16/13  5:54 AM      Result Value Range   Prothrombin Time 26.0 (*) 11.6 - 15.2 seconds   INR 2.48 (*) 0.00 - 1.49  PRO B NATRIURETIC PEPTIDE     Status: Abnormal   Collection Time    01/16/13  6:56 AM      Result Value Range   Pro B Natriuretic peptide (BNP) 2446.0 (*) 0 - 125 pg/mL  TROPONIN I     Status: Abnormal   Collection Time    01/16/13  8:52 AM      Result Value Range   Troponin I 4.60 (*) <0.30 ng/mL  GLUCOSE, CAPILLARY     Status:  Abnormal   Collection Time    01/16/13  1:54 PM      Result Value Range   Glucose-Capillary 209 (*) 70 - 99 mg/dL  TROPONIN I     Status: Abnormal   Collection Time    01/16/13  2:48 PM      Result Value Range   Troponin I 11.83 (*) <0.30 ng/mL  MRSA PCR SCREENING     Status: None   Collection Time    01/16/13  4:08 PM      Result Value Range   MRSA by PCR NEGATIVE  NEGATIVE    Radiology/Studies: Dg Chest Port 1 View  01/16/2013   *RADIOLOGY REPORT*  Clinical Data: Shortness of breath.  PORTABLE CHEST - 1 VIEW  Comparison: Chest radiograph from 05/25/2011  Findings: The lungs  are well expanded.  Bilateral airspace opacification is noted, particularly at the lower lung zones.  This may reflect pneumonia or pulmonary edema.  Small bilateral pleural effusions are seen.  No pneumothorax is seen.  Underlying vascular congestion is noted.  The cardiomediastinal silhouette is borderline normal in size.  The patient is status post median sternotomy.  A mitral valve replacement is noted.  Calcification is noted in the aortic arch. No acute osseous abnormalities are seen.  IMPRESSION: Bilateral airspace opacification, particularly at the lower lung zones.  This may reflect pneumonia or pulmonary edema.  Underlying vascular congestion noted.  Small bilateral pleural effusions seen.   Original Report Authenticated By: Tonia Ghent, M.D.    EKG:  None available. Tele:  Afib  90s to 110.    ASSESSMENT AND PLAN:   Patient is a 67 yo with history of CAD (s/p CABG), MVR, AVR.  Presents with severe Chest pressure, SOB and rapid afib.  Troponin is significantly elevated.  I have not seen EKG but report of ST depression.  Patient has responded some to medical Rx.   Concerned about condition of native vessels and grafts.   On exam, patient still with mild elevation of HR at times.  Some volume overload  1.  Positive troponin.  Continue to follow trends.  I would recomm cath in AM if able to redefine  anatomy  2.  Afib.  Continue dilt  Advance.  Consider very low dose lopressor  Watch pulm exam INR still over 2  I would recomm very low dose vit K  Repeat in AM  Begin heparin  3.  AVR/VR.  See problem 2.  Would recomm echo to evaluate  4.  CHF Prob secondary to 1 and 2  Follow  Rx with lasix.     Scherrie Merritts 01/16/2013, 10:05 PM

## 2013-01-17 ENCOUNTER — Encounter (HOSPITAL_COMMUNITY)
Admission: EM | Disposition: A | Discharge status: Home / Self Care | Payer: Self-pay | Source: Home / Self Care | Attending: Cardiovascular Disease

## 2013-01-17 DIAGNOSIS — I2510 Atherosclerotic heart disease of native coronary artery without angina pectoris: Secondary | ICD-10-CM

## 2013-01-17 DIAGNOSIS — I214 Non-ST elevation (NSTEMI) myocardial infarction: Secondary | ICD-10-CM

## 2013-01-17 DIAGNOSIS — J9601 Acute respiratory failure with hypoxia: Secondary | ICD-10-CM | POA: Diagnosis present

## 2013-01-17 DIAGNOSIS — I472 Ventricular tachycardia: Secondary | ICD-10-CM

## 2013-01-17 DIAGNOSIS — J449 Chronic obstructive pulmonary disease, unspecified: Secondary | ICD-10-CM

## 2013-01-17 HISTORY — PX: GRAFT(S) ANGIOGRAM: SHX5479

## 2013-01-17 HISTORY — PX: CORONARY ANGIOGRAM: SHX5466

## 2013-01-17 LAB — CBC
HCT: 30 % — ABNORMAL LOW (ref 39.0–52.0)
MCV: 82 fL (ref 78.0–100.0)
RBC: 3.66 MIL/uL — ABNORMAL LOW (ref 4.22–5.81)
WBC: 14.6 10*3/uL — ABNORMAL HIGH (ref 4.0–10.5)

## 2013-01-17 LAB — COMPREHENSIVE METABOLIC PANEL
AST: 50 U/L — ABNORMAL HIGH (ref 0–37)
Albumin: 3.8 g/dL (ref 3.5–5.2)
Calcium: 9 mg/dL (ref 8.4–10.5)
Creatinine, Ser: 1.13 mg/dL (ref 0.50–1.35)
GFR calc non Af Amer: 65 mL/min — ABNORMAL LOW (ref >90)
Total Protein: 7.3 g/dL (ref 6.0–8.3)

## 2013-01-17 LAB — PROTIME-INR
INR: 1.86 — ABNORMAL HIGH (ref 0.00–1.49)
Prothrombin Time: 20.9 seconds — ABNORMAL HIGH (ref 11.6–15.2)

## 2013-01-17 SURGERY — PERCUTANEOUS CORONARY STENT INTERVENTION (PCI-S)

## 2013-01-17 MED ORDER — SODIUM CHLORIDE 0.9 % IV SOLN
INTRAVENOUS | Status: AC
  Administered 2013-01-17: 13:00:00 via INTRAVENOUS

## 2013-01-17 MED ORDER — METOPROLOL TARTRATE 50 MG PO TABS
50.0000 mg | ORAL_TABLET | Freq: Two times a day (BID) | ORAL | Status: DC
  Filled 2013-01-17 (×2): qty 1

## 2013-01-17 MED ORDER — CLOPIDOGREL BISULFATE 75 MG PO TABS
75.0000 mg | ORAL_TABLET | Freq: Every day | ORAL | Status: DC
  Administered 2013-01-18 – 2013-01-22 (×5): 75 mg via ORAL
  Filled 2013-01-17 (×7): qty 1

## 2013-01-17 MED ORDER — WARFARIN SODIUM 3 MG PO TABS
9.0000 mg | ORAL_TABLET | Freq: Once | ORAL | Status: AC
  Administered 2013-01-17: 9 mg via ORAL
  Filled 2013-01-17: qty 1

## 2013-01-17 MED ORDER — FUROSEMIDE 10 MG/ML IJ SOLN
40.0000 mg | Freq: Once | INTRAMUSCULAR | Status: AC
  Administered 2013-01-17: 40 mg via INTRAVENOUS

## 2013-01-17 MED ORDER — FUROSEMIDE 10 MG/ML IJ SOLN
INTRAMUSCULAR | Status: AC
  Filled 2013-01-17: qty 4

## 2013-01-17 MED ORDER — NITROGLYCERIN 0.2 MG/ML ON CALL CATH LAB
INTRAVENOUS | Status: AC
  Filled 2013-01-17: qty 1

## 2013-01-17 MED ORDER — FUROSEMIDE 40 MG PO TABS
40.0000 mg | ORAL_TABLET | Freq: Every day | ORAL | Status: DC
  Administered 2013-01-18 – 2013-01-22 (×5): 40 mg via ORAL
  Filled 2013-01-17 (×5): qty 1

## 2013-01-17 MED ORDER — WARFARIN - PHARMACIST DOSING INPATIENT
Freq: Every day | Status: DC
  Administered 2013-01-20 – 2013-01-21 (×2)

## 2013-01-17 MED ORDER — LIDOCAINE HCL (PF) 1 % IJ SOLN
INTRAMUSCULAR | Status: AC
  Filled 2013-01-17: qty 30

## 2013-01-17 MED ORDER — MIDAZOLAM HCL 2 MG/2ML IJ SOLN
INTRAMUSCULAR | Status: AC
  Filled 2013-01-17: qty 2

## 2013-01-17 MED ORDER — FENTANYL CITRATE 0.05 MG/ML IJ SOLN
INTRAMUSCULAR | Status: AC
  Filled 2013-01-17: qty 2

## 2013-01-17 MED ORDER — AMLODIPINE BESYLATE 5 MG PO TABS
5.0000 mg | ORAL_TABLET | Freq: Every day | ORAL | Status: DC
  Administered 2013-01-17: 5 mg via ORAL
  Filled 2013-01-17 (×2): qty 1

## 2013-01-17 MED ORDER — METOPROLOL TARTRATE 50 MG PO TABS
75.0000 mg | ORAL_TABLET | Freq: Two times a day (BID) | ORAL | Status: DC
  Administered 2013-01-17: 75 mg via ORAL
  Filled 2013-01-17 (×3): qty 1

## 2013-01-17 MED ORDER — HEPARIN (PORCINE) IN NACL 100-0.45 UNIT/ML-% IJ SOLN
1100.0000 [IU]/h | INTRAMUSCULAR | Status: DC
  Administered 2013-01-18 – 2013-01-22 (×4): 1100 [IU]/h via INTRAVENOUS
  Filled 2013-01-17 (×10): qty 250

## 2013-01-17 MED ORDER — CLOPIDOGREL BISULFATE 300 MG PO TABS
ORAL_TABLET | ORAL | Status: AC
  Filled 2013-01-17: qty 2

## 2013-01-17 MED ORDER — BIVALIRUDIN 250 MG IV SOLR
INTRAVENOUS | Status: AC
  Filled 2013-01-17: qty 250

## 2013-01-17 MED ORDER — HEPARIN (PORCINE) IN NACL 2-0.9 UNIT/ML-% IJ SOLN
INTRAMUSCULAR | Status: AC
  Filled 2013-01-17: qty 1000

## 2013-01-17 NOTE — Progress Notes (Signed)
Pt having increase in breathing difficulty.  Better for a short while after breathing treatment.  Auscultate rhonchi.  Pt on 40% venti mask and sats 85-89%.  Called and spoke with cardiologist on call and orders to give dose of lasix.

## 2013-01-17 NOTE — Care Management Note (Signed)
    Page 1 of 1   01/17/2013     9:04:34 AM   CARE MANAGEMENT NOTE 01/17/2013  Patient:  [name], [name]   Account Number:  [id number]  Date Initiated:  01/17/2013  Documentation initiated by:  Junius Creamer  Subjective/Objective Assessment:   adm w mi     Action/Plan:   lives alone, pcp dr ed Juanetta Gosling   Anticipated DC Date:     Anticipated DC Plan:  HOME/SELF CARE      DC Planning Services  CM consult      Choice offered to / List presented to:             Status of service:   Medicare Important Message given?   (If response is "NO", the following Medicare IM given date fields will be blank) Date Medicare IM given:   Date Additional Medicare IM given:    Discharge Disposition:    Per UR Regulation:  Reviewed for med. necessity/level of care/duration of stay  If discussed at Long Length of Stay Meetings, dates discussed:    Comments:

## 2013-01-17 NOTE — Progress Notes (Signed)
[facility] Progress Note [place] TEAM 1 - Stepdown/ICU TEAM   [name]. MWN:[id number] DOB: [birth year] DOA: 01/16/2013 PCP: Fredirick Maudlin, MD  Brief narrative: Patient with known history of CAD as well as prior valve replacements on chronic anticoagulation. He also has COPD and regular alcohol use. He presented to the emergency department with shortness of breath and chest pain. Chest pain was quite typical and concerning for unstable angina especially in the setting of associated symptoms including diaphoresis, nausea and radiation. Because shortness of breath was his primary complaint and his history of COPD he initially was treated as a COPD exacerbation. After being given a continuous albuterol nebulizer he developed increasing chest discomfort and atrial fibrillation. His initial troponin was 0.37. He did have ST depression concurrently with the rapid ventricular response. This is not unusual with tachycardic rates. His chest x-ray revealed bilateral airspace opacification which could be either edema versus a pneumonia process. He was given Lasix IV in the emergency department. Followup troponin had increased to 4.6. He was given Cardizem for rate control for the atrial fibrillation. Because of concerns for unstable angina in a patient with known coronary disease cardiology was consulted. The patient was subsequently transferred from [facility] to [facility].  Assessment/Plan: Active Problems:   Arteriosclerotic cardiovascular disease (ASCVD)/   NSTEMI (non-ST elevated myocardial infarction) -per Cards -for cath today -cont IV Heparin -no further CP -TNI peak ~ 11 and now trending down -leukocytosis from ischemia -? Resume BB since borderline tachycardia    Atrial fibrillation with RVR -SR with frequent PAC's today -cont IV CCB -Cards resuming Coumadin     Non-sustained ventricular tachycardia -likely due to ischemia -K+ 3.9 -check Mg    HX OF  MVR/AVR on Chronic anticoagulation -coumadin as above with goal INR >2.5    Hypertension -moderate control -was on Norvasc, Lopressor, clonidine and Prinivil at home -on CCB IV for AF/RVR -resume Prinivil and Lopressor per Cards recs    Acute respiratory failure with hypoxia due to acute ischemic CHF -Lasix given per Cards  -FU on cath EF    HYPERLIPIDEMIA -cont statin    ANEMIA -Hgb at baseline    CHRONIC OBSTRUCTIVE PULMONARY DISEASE  -compensated without wheeze    GERD (gastroesophageal reflux disease) -cont PPI   DVT prophylaxis: IV heparin transitioning to Coumadin Code Status: Full Family Communication: Patient Disposition Plan: Remain in step down Isolation: None Nutritional Status: Compensated without evidence of acute protein calorie malnutrition  Consultants: Cardiology  Procedures: Cardiac catheterization pending 2-D echocardiogram pending  Antibiotics: None  HPI/Subjective: Patient currently without chest pain or shortness of breath. All questions answered.   Objective: Blood pressure 161/69, pulse 89, temperature 98 F (36.7 C), temperature source Oral, resp. rate 22, height 5\' 6"  (1.676 m), weight 69.5 kg (153 lb 3.5 oz), SpO2 95.00%.  Intake/Output Summary (Last 24 hours) at 01/17/13 1057 Last data filed at 01/17/13 0920  Gross per 24 hour  Intake 1069.97 ml  Output   2325 ml  Net -1255.03 ml     Exam: General: No acute respiratory distress Lungs: Clear to auscultation bilaterally without wheezes or crackles, RA Cardiovascular: Irregular rate frequent PAC -without murmur gallop or rub normal S1 and S2, no peripheral edema or JVD Abdomen: Nontender, nondistended, soft, bowel sounds positive, no rebound, no ascites, no appreciable mass Musculoskeletal: No significant cyanosis, clubbing of bilateral lower extremities Neurological: Alert and oriented x 3, moves all extremities x 4 without focal neurological deficits, CN 2-12  intact  Scheduled Meds: Scheduled Meds: . atorvastatin  40 mg Oral Daily  . diltiazem  10 mg Intravenous Once  . ferrous sulfate  325 mg Oral Q breakfast  . folic acid  1 mg Oral Daily  . furosemide      . multivitamin with minerals  1 tablet Oral Daily  . pantoprazole  40 mg Oral Daily  . sodium chloride  3 mL Intravenous Q12H  . sodium chloride  3 mL Intravenous Q12H  . sodium chloride  3 mL Intravenous Q12H  . thiamine  100 mg Oral Daily   Or  . thiamine  100 mg Intravenous Daily   Continuous Infusions: . sodium chloride    . sodium chloride    . diltiazem (CARDIZEM) infusion 10 mg/hr (01/17/13 0920)  . heparin 1,100 Units/hr (01/17/13 0700)    **Reviewed in detail by the Attending Physician   Data Reviewed: Basic Metabolic Panel:  Recent Labs Lab 01/16/13 0554 01/17/13 0400  NA 127* 131*  K 4.6 3.9  CL 91* 92*  CO2 27 26  GLUCOSE 182* 122*  BUN 24* 26*  CREATININE 1.08 1.13  CALCIUM 8.9 9.0   Liver Function Tests:  Recent Labs Lab 01/17/13 0400  AST 50*  ALT 15  ALKPHOS 102  BILITOT 0.7  PROT 7.3  ALBUMIN 3.8   No results found for this basename: LIPASE, AMYLASE,  in the last 168 hours No results found for this basename: AMMONIA,  in the last 168 hours CBC:  Recent Labs Lab 01/16/13 0554 01/17/13 0400  WBC 6.3 14.6*  NEUTROABS 5.2  --   HGB 11.2* 10.0*  HCT 33.4* 30.0*  MCV 84.1 82.0  PLT 298 314   Cardiac Enzymes:  Recent Labs Lab 01/16/13 0554 01/16/13 0852 01/16/13 1448 01/17/13 0400  TROPONINI 0.37* 4.60* 11.83* 5.05*   BNP (last 3 results)  Recent Labs  01/16/13 0656  PROBNP 2446.0*   CBG:  Recent Labs Lab 01/16/13 1354  GLUCAP 209*    Recent Results (from the past 240 hour(s))  MRSA PCR SCREENING     Status: None   Collection Time    01/16/13  4:08 PM      Result Value Range Status   MRSA by PCR NEGATIVE  NEGATIVE Final   Comment:            The GeneXpert MRSA Assay (FDA     approved for NASAL specimens      only), is one component of a     comprehensive MRSA colonization     surveillance program. It is not     intended to diagnose MRSA     infection nor to guide or     monitor treatment for     MRSA infections.     Studies:  Recent x-ray studies have been reviewed in detail by the Attending Physician    Junious Silk, ANP [facility] Office  [phone] Pager [phone]  **If unable to reach the above provider after paging please contact the Flow Manager @ [phone]  On-Call/Text Page:      [email or web address]      password [email or web address]  If 7PM-7AM, please contact night-coverage [email or web address] Password [name] 01/17/2013, 10:57 AM   LOS: 1 day   Attending Patient seen and examined, agree with the assessment and plans as outlined above.  S Ghimire

## 2013-01-17 NOTE — Progress Notes (Signed)
    I have seen and examined the patient. I agree with the above note with the addition of : NSTEMI with CHF this am which required Lasix. Long run of NSVT. Cardiac cath this am.   Lorine Bears MD, [facility] 01/17/2013 9:32 AM

## 2013-01-17 NOTE — Progress Notes (Signed)
ANTICOAGULATION CONSULT NOTE Pharmacy Consult for Heparin Indication: chest pain/ACS and h/o AVR/MVR/Afib  No Known Allergies  Patient Measurements: Height: 5\' 6"  (167.6 cm) Weight: 152 lb 8.9 oz (69.2 kg) IBW/kg (Calculated) : 63.8  Vital Signs: Temp: 98.1 F (36.7 C) (07/14 2341) Temp src: Oral (07/14 2341) BP: 155/91 mmHg (07/15 0312) Pulse Rate: 90 (07/15 0312)  Labs:  Recent Labs  01/16/13 0554 01/16/13 0852 01/16/13 1448 01/17/13 0400  HGB 11.2*  --   --  10.0*  HCT 33.4*  --   --  30.0*  PLT 298  --   --  314  LABPROT 26.0*  --   --  20.9*  INR 2.48*  --   --  1.86*  HEPARINUNFRC  --   --   --  0.42  CREATININE 1.08  --   --  1.13  TROPONINI 0.37* 4.60* 11.83* 5.05*    Estimated Creatinine Clearance: 57.2 ml/min (by C-G formula based on Cr of 1.13).  Assessment: 67 yo male with NSTEMI, ho AVR/MVR/Afib, Coumadin on hold for cath, for heparin.    Goal of Therapy:  Heparin level 0.3-0.7 units/ml Monitor platelets by anticoagulation protocol: Yes   Plan:  Continue Heparin at current rate  F/U plan for cath  Eddie Candle 01/17/2013,5:25 AM

## 2013-01-17 NOTE — Progress Notes (Signed)
Patient Name: [name]. Date of Encounter: 01/17/2013    Principal Problem:   NSTEMI (non-ST elevated myocardial infarction) Active Problems:   HYPERLIPIDEMIA   HYPONATREMIA, CHRONIC   ANEMIA   TOBACCO ABUSE   CHRONIC OBSTRUCTIVE PULMONARY DISEASE   MITRAL VALVE REPLACEMENT, HX OF   AORTIC VALVE REPLACEMENT, HX OF   Hypertension   GERD (gastroesophageal reflux disease)   Atrial fibrillation with RVR   Chest pain    SUBJECTIVE: Feels better this AM. Had a rough night. Increased respiratory effort requiring transient Venti mask and Lasix. Breathing much better now. Still a bit shaky. Denies chest pain.   OBJECTIVE  Filed Vitals:   01/17/13 0312 01/17/13 0500 01/17/13 0549 01/17/13 0734  BP: 155/91  163/65 161/69  Pulse: 90  101 89  Temp:    98 F (36.7 C)  TempSrc:    Oral  Resp: 23  20 22   Height:      Weight:  69.5 kg (153 lb 3.5 oz)    SpO2: 88%  93% 95%    Intake/Output Summary (Last 24 hours) at 01/17/13 0856 Last data filed at 01/17/13 0700  Gross per 24 hour  Intake 986.97 ml  Output   2325 ml  Net -1338.03 ml   Weight change:   PHYSICAL EXAM  General: Elderly appearing male in no acute distress.  Head: Normocephalic, atraumatic, sclera non-icteric, no xanthomas, nares are without discharge.  Neck: Supple without bruits or JVP 6-7 cm.  Lungs:  Resp regular and unlabored, distant breath sounds. No appreciable wheezes, rales or rhonchi Heart: RRR, crisp mechanical S1, S2, no s3, s4, or murmurs. Abdomen: Soft, non-tender, non-distended, BS + x 4.  Msk: Strength and tone appears normal for age. Extremities: No clubbing, cyanosis or edema. DP/PT/Radials 2+ and equal bilaterally. Neuro: Alert and oriented X 3. Moves all extremities spontaneously. Psych: Normal affect.  LABS:  Recent Labs     01/16/13  0554  01/17/13  0400  WBC  6.3  14.6*  HGB  11.2*  10.0*  HCT  33.4*  30.0*  MCV  84.1  82.0  PLT  298  314   Recent Labs Lab  01/16/13 0554 01/17/13 0400  NA 127* 131*  K 4.6 3.9  CL 91* 92*  CO2 27 26  BUN 24* 26*  CREATININE 1.08 1.13  CALCIUM 8.9 9.0  PROT  --  7.3  BILITOT  --  0.7  ALKPHOS  --  102  ALT  --  15  AST  --  50*  GLUCOSE 182* 122*   Recent Labs     01/16/13  0852  01/16/13  1448  01/17/13  0400  TROPONINI  4.60*  11.83*  5.05*   TELE: NSR/sinus tachycardia, HR 90-100, frequent PACs, occasional PVCs  ECG: NSR, 95 bpm, downsloping ST depressions, V4-V6, I, aVL  Radiology/Studies:  Dg Chest Port 1 View  01/16/2013   *RADIOLOGY REPORT*  Clinical Data: Shortness of breath.  PORTABLE CHEST - 1 VIEW  Comparison: Chest radiograph from 05/25/2011  Findings: The lungs are well expanded.  Bilateral airspace opacification is noted, particularly at the lower lung zones.  This may reflect pneumonia or pulmonary edema.  Small bilateral pleural effusions are seen.  No pneumothorax is seen.  Underlying vascular congestion is noted.  The cardiomediastinal silhouette is borderline normal in size.  The patient is status post median sternotomy.  A mitral valve replacement is noted.  Calcification is noted in the aortic arch.  No acute osseous abnormalities are seen.  IMPRESSION: Bilateral airspace opacification, particularly at the lower lung zones.  This may reflect pneumonia or pulmonary edema.  Underlying vascular congestion noted.  Small bilateral pleural effusions seen.   Original Report Authenticated By: Tonia Ghent, M.D.    Current Medications:  . atorvastatin  40 mg Oral Daily  . diltiazem  10 mg Intravenous Once  . ferrous sulfate  325 mg Oral Q breakfast  . folic acid  1 mg Oral Daily  . furosemide      . multivitamin with minerals  1 tablet Oral Daily  . pantoprazole  40 mg Oral Daily  . sodium chloride  3 mL Intravenous Q12H  . sodium chloride  3 mL Intravenous Q12H  . sodium chloride  3 mL Intravenous Q12H  . thiamine  100 mg Oral Daily   Or  . thiamine  100 mg Intravenous Daily     ASSESSMENT AND PLAN:  1. Atrial fibrillation with RVR- converted to NSR with frequent PACs. Holding NSR this AM. On diltiazem gtt 10 mg/hr. Would continue during cath today to avoid RVR episode/decompensation. Resume Coumadin for anticoagulation given AVR/MVR.  2. NSTEMI/CAD s/p CABG- TnI trend 0.37->4.60->11.83->5.05. INR 1.83. Heparinized last night. NPO this AM for cath. Consider BMS should PCI be elected to avoid extended duration of DAPT and run INRs on 2.5 end for that period.   3. H/o severe AS/MS s/p St. Jude mechanical AVR- heparinized. Luckily patient was on Coumadin for AVR/MVR already. Will need to resume post-cath (see above). Crisp mechanical S1, S2. No murmurs.   4. Chronic Coumadin anticoagulation- INR 1.83, for cath this AM. Heparinized.   5. Acute unspecified CHF- the patient had an episode of acute pulmonary edema last night. He had significant output with Lasix and was able to be weaned off Venti mask. Total I/O -1338 mL. Neck veins up a bit this AM. Would give another dose of Lasix 20mg  IV x 1. Exacerbation multifactorial secondary to #1, #2 and HTN. Concern is for ischemic mediated atrial fibrillation. Will need to ensure adequate BP control. Obtain formal 2D echo to assess EF, valves, diastolic function and R sided pressures specifically.  6. COPD- per the patient. Follow-up pulmonary/PCP as an outpatient. Reports using albuterol nebs only PRN. This may need to be transitioned to Xopenex given a-fib. He is not on ipratroprium or ICS.   7. Hypertension- BP labile yesterday during RVR episode. Hypertensive overnight. Would restart ACEi and BB. Clonidine was discontinued. Consider tapering off to avoid rebound HTN. The patient is also on Norvasc outpatient. Would hold N until PO rate-control regimen chistled out.   8. NSVT- episode this AM. Asymptomatic. Objective myocardial injury supported on serial troponins. For cath this AM.     Signed, R. Hurman Horn,  PA-C 01/17/2013, 8:56 AM

## 2013-01-17 NOTE — Progress Notes (Signed)
ANTICOAGULATION CONSULT NOTE Pharmacy Consult for Heparin, warfarin Indication: chest pain/ACS and h/o AVR/MVR/Afib  No Known Allergies  Patient Measurements: Height: 5\' 6"  (167.6 cm) Weight: 153 lb 3.5 oz (69.5 kg) IBW/kg (Calculated) : 63.8  Vital Signs: Temp: 98.5 F (36.9 C) (07/15 1217) Temp src: Oral (07/15 1217) BP: 167/75 mmHg (07/15 1217) Pulse Rate: 94 (07/15 1217)  Labs:  Recent Labs  01/16/13 0554 01/16/13 0852 01/16/13 1448 01/17/13 0400  HGB 11.2*  --   --  10.0*  HCT 33.4*  --   --  30.0*  PLT 298  --   --  314  LABPROT 26.0*  --   --  20.9*  INR 2.48*  --   --  1.86*  HEPARINUNFRC  --   --   --  0.42  CREATININE 1.08  --   --  1.13  TROPONINI 0.37* 4.60* 11.83* 5.05*    Estimated Creatinine Clearance: 57.2 ml/min (by C-G formula based on Cr of 1.13).  Assessment: 67 yo male admitted 01/16/2013   with NSTEMI.  Pharmacy consulted to dose heparin and warfarin s/p cath.  PMH:  CAD, COPD, hyperlipidemia, HTN, CKD, AVR/MVR: both St Jude; Afib, PTA warfarin: 7.5 mg Mon& Thurs, 5 mg all other days  Events: s/p cath with DES to mid RCA to resume heparin 8h after sheath out.   (@1130 )   Coag: MVR: no bleeding noted ID: Afe, WBC 14.6,  CV: CAD, HTN, hyperlipidemia, afib Amlo, atorv, clop, dilt ggt @10 , furo 40 PO, metop SBP 160s, HR 80-90s  Goals: INR 2.5-3.5 Heparin Level 0.3-0.7 Monitory Platelets Per Protocol: YES  Plan: 1) at 1930 start heparin at 1100 units/hr, no bolus 2) Check heparin level 6h after ggt starts 3) Daily heparin level and CBC, INR 4) Warfarin 9 mg PO x 1 at 1800  Thank you for allowing pharmacy to be a part of this patients care team.  Lovenia Kim Pharm.D., BCPS Clinical Pharmacist 01/17/2013 12:50 PM Pager: [phone] Phone: [phone]

## 2013-01-17 NOTE — Progress Notes (Signed)
01/17/2013  0920   To cath lab

## 2013-01-17 NOTE — Progress Notes (Signed)
Pt having difficulty breathing.  Auscultated bilateral wheezing.  Called for breathing treatment.  sats on 2 liters upper 80's.  Increased oxygen to 4 liters.  Also pt very shaky and diaphoretic.  Gave dose of ativan.

## 2013-01-17 NOTE — CV Procedure (Signed)
Cardiac Catheterization Procedure Note  Name: [name]. MRN: [id number] DOB: 02/17/1946  Procedure: Selective Coronary Angiography, SVG angiography, LIMA angiography,  PTCA/Stent of mid right coronary artery with a drug-eluting stent.  Indication: Non-ST elevation myocardial infarction with previous CABG and mitral/aortic valve replacement   Medications:  Sedation:  0.5 mg IV Versed, 25 mcg IV Fentanyl  Contrast:  245 ml Omnipaque  Diagnostic Procedure Details: The right groin was prepped, draped, and anesthetized with 1% lidocaine. Using the modified Seldinger technique, a 4 French micropuncture sheath was introduced into the right femoral artery. This was exchanged into a 5 [place] sheath. Standard Judkins catheters were used for selective coronary angiography. A JR catheter was used to engage the vein grafts. An IM catheter was used to engage the LIMA with a long exchange wire. Catheter exchanges were performed over a wire.  There is a mechanical aortic valve which obviously was not crossed. The diagnostic procedure was well-tolerated without immediate complications.  Procedural Findings:  Hemodynamics: AO:  145/59   mmHg   Coronary angiography: Coronary dominance:    Left Main:  Short in with no significant disease.  Left Anterior Descending (LAD):  Normal in size with moderate calcifications proximally. There is 50% disease proximally just before the diagonal branch. The vessel is very tortuous in the midsegment with diffuse 40-50% disease. There is competitive flow noted distally from a patent LIMA.  1st diagonal (D1):  Large in size with 50% ostial and proximal disease. This vessel is large and supplies most of the anterolateral wall.  2nd diagonal (D2):  Very small in size.  3rd diagonal (D3):  Very small in size.  Circumflex (LCx):  Normal in size and nondominant. The vessel is mildly calcified. There is 30 % tubular disease in the midsegment.  1st obtuse  marginal:  Very small in size with minor irregularities.  2nd obtuse marginal:  Small in size with 60% ostial disease.  3rd obtuse marginal:  Normal in size with 80% proximal disease. The vessel is very tortuous in that segment and distally.   AV groove continuation segment: Normal in size with minor irregularities.  Right Coronary Artery: Normal in size and dominant. The vessel is moderately calcified throughout its course. There is diffuse 50% disease in the midsegment. There is another lesion of 99% in the midsegment just before giving a large RV branch. There is 50% disease distally.  Posterior descending artery: Normal in size with minor irregularities.  Posterior AV segment: Small in size.    SVG to diagonal: Occluded proximally. I suspect that this is the culprit for non-ST elevation MI due to dye stain noted. SVG to right PDA: Occluded proximally. SVG to OM: Is occluded proximally. LIMA to LAD: Patent with no significant disease.  Left ventriculography: Was not performed.   PCI Procedure Note:  Following the diagnostic procedure, the decision was made to proceed with PCI. I reviewed the images with Dr. Excell Seltzer. The sheath was upsized to a 6 [place]. Weight-based bivalirudin was given for anticoagulation. Once a therapeutic ACT was achieved, a 6 [place] JR 4 guide catheter was inserted.  A run through coronary guidewire was used to cross the lesion.  The lesion was predilated with a 2.5 x 12 mm balloon. After crossing the lesion, there was no flow in the distal RCA likely due to severity of stenosis. There was sluggish flow or send RV branch. Thus, I used another run through a wire and placed in the RV branch for  protection. I then tried to advance a 2.5 x 20 mm Promus drug-eluting stent into the distal RCA but was not successful due to tortuosity and calcifications. This was in spite of having to I asked. I decided to predilate the lesion with a 2.5 noncompliant balloon which was done with  multiple inflations. I was then able to advance the stent after deep seating the guide.  The stent was deployed at 12 atmospheres.  I then removed the wire from the RV branch and post dilated the stent with a 2.5 noncompliant balloon to 16 atmospheres.   Following PCI, there was 0% residual stenosis and TIMI-3 flow. Final angiography confirmed an excellent result. There was still moderate disease in the mid RCA proximal to the stent which improved after removing the wires likely due to wire bias. This was left to be treated medically. Femoral hemostasis was achieved with a Mynx device.  The patient tolerated the PCI procedure well. There were no immediate procedural complications.  The patient was transferred to the post catheterization recovery area for further monitoring.  PCI Data: Vessel - mid RCA/Segment - 2 Percent Stenosis (pre)  99% TIMI-flow 3 Stent 2.5 x 20 mm Promus drug-eluting stent Percent Stenosis (post) 0% TIMI-flow (post) 3  Final Conclusions:  1. Significant three-vessel coronary artery disease with occluded vein grafts. Patent LIMA to LAD. The culprit for non-ST elevation myocardial infarction is likely occluded SVG to diagonal. However, the native diagonal is large and does not seem to have obstructive disease. There is significant disease in proximal OM 3. However, the vessel is tortuous and calcified. Severe 99% stenosis in mid RCA at the bifurcation with a large RV branch. 2. Successful angioplasty and drug-eluting stent placement to the mid RCA.  Recommendations:  I recommend treatment with Plavix and warfarin alone without aspirin to minimize risk of bleeding. I will resume heparin 8 hours from now and resume warfarin as well. Obtain an echocardiogram to evaluate LV systolic function and the function of mechanical valves. Recommend rate control for atrial fibrillation. I increased the dose of metoprolol. Recommend medical therapy for that disease in OM3 which is tortuous and  calcified.  Lorine Bears MD, [facility] 01/17/2013, 11:45 AM

## 2013-01-18 ENCOUNTER — Inpatient Hospital Stay (HOSPITAL_COMMUNITY): Payer: Medicare Other

## 2013-01-18 DIAGNOSIS — I517 Cardiomegaly: Secondary | ICD-10-CM

## 2013-01-18 DIAGNOSIS — I214 Non-ST elevation (NSTEMI) myocardial infarction: Secondary | ICD-10-CM

## 2013-01-18 LAB — HEPARIN LEVEL (UNFRACTIONATED): Heparin Unfractionated: 0.67 IU/mL (ref 0.30–0.70)

## 2013-01-18 LAB — CBC
Hemoglobin: 9.8 g/dL — ABNORMAL LOW (ref 13.0–17.0)
RBC: 3.51 MIL/uL — ABNORMAL LOW (ref 4.22–5.81)
WBC: 11.5 10*3/uL — ABNORMAL HIGH (ref 4.0–10.5)

## 2013-01-18 LAB — PROTIME-INR: INR: 1.25 (ref 0.00–1.49)

## 2013-01-18 LAB — BASIC METABOLIC PANEL
CO2: 30 mEq/L (ref 19–32)
Chloride: 98 mEq/L (ref 96–112)
GFR calc non Af Amer: 68 mL/min — ABNORMAL LOW (ref >90)
Glucose, Bld: 105 mg/dL — ABNORMAL HIGH (ref 70–99)
Potassium: 4.1 mEq/L (ref 3.5–5.1)
Sodium: 135 mEq/L (ref 135–145)

## 2013-01-18 MED ORDER — AMLODIPINE BESYLATE 10 MG PO TABS
10.0000 mg | ORAL_TABLET | Freq: Every day | ORAL | Status: DC
  Administered 2013-01-18 – 2013-01-22 (×5): 10 mg via ORAL
  Filled 2013-01-18 (×5): qty 1

## 2013-01-18 MED ORDER — FUROSEMIDE 10 MG/ML IJ SOLN
INTRAMUSCULAR | Status: AC
  Filled 2013-01-18: qty 4

## 2013-01-18 MED ORDER — LISINOPRIL 5 MG PO TABS
5.0000 mg | ORAL_TABLET | Freq: Every day | ORAL | Status: DC
  Administered 2013-01-18 – 2013-01-22 (×5): 5 mg via ORAL
  Filled 2013-01-18 (×5): qty 1

## 2013-01-18 MED ORDER — WARFARIN SODIUM 7.5 MG PO TABS
7.5000 mg | ORAL_TABLET | Freq: Once | ORAL | Status: AC
  Administered 2013-01-18: 7.5 mg via ORAL
  Filled 2013-01-18: qty 1

## 2013-01-18 MED ORDER — FUROSEMIDE 10 MG/ML IJ SOLN
40.0000 mg | Freq: Once | INTRAMUSCULAR | Status: AC
  Administered 2013-01-18: 40 mg via INTRAVENOUS

## 2013-01-18 MED ORDER — LEVALBUTEROL HCL 0.63 MG/3ML IN NEBU
0.6300 mg | INHALATION_SOLUTION | Freq: Once | RESPIRATORY_TRACT | Status: AC
  Administered 2013-01-18: 0.63 mg via RESPIRATORY_TRACT

## 2013-01-18 MED ORDER — METOPROLOL TARTRATE 100 MG PO TABS
100.0000 mg | ORAL_TABLET | Freq: Two times a day (BID) | ORAL | Status: DC
  Administered 2013-01-18 – 2013-01-22 (×9): 100 mg via ORAL
  Filled 2013-01-18 (×10): qty 1

## 2013-01-18 MED FILL — Sodium Chloride IV Soln 0.9%: INTRAVENOUS | Qty: 50 | Status: AC

## 2013-01-18 NOTE — Progress Notes (Signed)
ANTICOAGULATION CONSULT NOTE Pharmacy Consult for Heparin, warfarin Indication: chest pain/ACS and h/o AVR/MVR/Afib  No Known Allergies  Patient Measurements: Height: 5\' 6"  (167.6 cm) Weight: 154 lb 1.6 oz (69.9 kg) IBW/kg (Calculated) : 63.8  Vital Signs: Temp: 98.7 F (37.1 C) (07/16 0731) Temp src: Oral (07/16 0731) BP: 161/63 mmHg (07/16 0731) Pulse Rate: 87 (07/16 0731)  Labs:  Recent Labs  01/16/13 0554 01/16/13 0852 01/16/13 1448 01/17/13 0400 01/18/13 0420  HGB 11.2*  --   --  10.0* 9.8*  HCT 33.4*  --   --  30.0* 29.4*  PLT 298  --   --  314 277  LABPROT 26.0*  --   --  20.9* 15.4*  INR 2.48*  --   --  1.86* 1.25  HEPARINUNFRC  --   --   --  0.42 0.67  CREATININE 1.08  --   --  1.13 1.09  TROPONINI 0.37* 4.60* 11.83* 5.05*  --     Estimated Creatinine Clearance: 59.3 ml/min (by C-G formula based on Cr of 1.09).  Assessment: 67 yo male admitted 01/16/2013  with NSTEMI and s/p cath with DES to RCA.  Patient is at goal on heparin (HL= 0.67) and restarted coumadin 7/15 (INR today= 1.25). Patient noted s/p vitamin K 3mg  IV on 7/14.  PTA warfarin regimen: 7.5 mg Mon& Thurs, 5 mg all other days.   Goals: INR 2.5-3.5 Heparin Level 0.3-0.7 Monitory Platelets Per Protocol: YES  Plan: -No heparin changes needed -Will give coumadin 7.5mg  today  -Daily heparin level and CBC -Daily PT/INR  Harland German, Pharm D 01/18/2013 11:43 AM

## 2013-01-18 NOTE — Progress Notes (Signed)
Routine oxygen saturation check revealed a saturation of 82% on 4liters by nasal canula.  [name] was asleep and breathing through his mouth.  Mildly confused, he was unable to follow commands to breath through his nose.  Ascultation of his lungs revealed very diminished breath sounds with wheezes in the upper lobes and crackles in the lower lobes.  O2 saturations did improve to 90% with a venturi mask at 50%.  Dr. Mayford Knife called and orders were received to give another respiratory treatment and a one time dose of Lasix 40 mg.

## 2013-01-18 NOTE — Progress Notes (Addendum)
1435 Cardiac Rehab Came to ambulate pt, he is now having echo. Discussed with RN states that she had just give pt Ativan and he might be to sleepy to walk. At 1515 echo still in room, we will follow pt tomorrow. Nurse states that she has had him up in recliner and walked him in room. Nurse states that he was a difficult ambulation, unsteady. Melina Copa RN

## 2013-01-18 NOTE — Progress Notes (Signed)
[facility] [place] TEAM 1 - Stepdown/ICU TEAM  Spoke w/ Dr. Kirke Corin.  Hospitalist service is adding little to the excellent care being provided by the Cardiology service. Dr. Kirke Corin has agreed to assume the attending role.  [facility] will sign off.    Thank you [facility].  Lonia Blood, MD [facility] Office  [phone] Pager [phone]  On-Call/Text Page:      [email or web address]      password [facility]

## 2013-01-18 NOTE — Progress Notes (Signed)
  Echocardiogram 2D Echocardiogram has been performed.  [name] FRANCES 01/18/2013, 3:47 PM

## 2013-01-18 NOTE — Progress Notes (Signed)
Patient Name: [name]. Date of Encounter: 01/18/2013    Active Problems:   HYPERLIPIDEMIA   ANEMIA   CHRONIC OBSTRUCTIVE PULMONARY DISEASE   MITRAL VALVE REPLACEMENT, HX OF   AORTIC VALVE REPLACEMENT, HX OF   Chronic anticoagulation   Arteriosclerotic cardiovascular disease (ASCVD)   Hypertension   GERD (gastroesophageal reflux disease)   Atrial fibrillation with RVR   NSTEMI (non-ST elevated myocardial infarction)   Non-sustained ventricular tachycardia   Acute respiratory failure with hypoxia    SUBJECTIVE: Feels better this AM. BP continues to run high. He had hypoxia and dyspnea again this am. Was given IV Lasix.   OBJECTIVE  Filed Vitals:   01/18/13 0400 01/18/13 0421 01/18/13 0600 01/18/13 0731  BP: 156/67  156/67 161/63  Pulse:    87  Temp: 98.5 F (36.9 C)   98.7 F (37.1 C)  TempSrc: Oral   Oral  Resp:      Height:      Weight: 69.9 kg (154 lb 1.6 oz)     SpO2:  96%  94%    Intake/Output Summary (Last 24 hours) at 01/18/13 0827 Last data filed at 01/18/13 0454  Gross per 24 hour  Intake 1774.8 ml  Output   1750 ml  Net   24.8 ml   Weight change: 0.7 kg (1 lb 8.7 oz)  PHYSICAL EXAM  General: Elderly appearing male in no acute distress.  Head: Normocephalic, atraumatic, sclera non-icteric, no xanthomas, nares are without discharge.  Neck: Supple without bruits or JVP 6-7 cm.  Lungs:  Resp regular and unlabored, distant breath sounds. No appreciable wheezes, rales or rhonchi Heart: RRR, crisp mechanical S1, S2, no s3, s4, or murmurs. Abdomen: Soft, non-tender, non-distended, BS + x 4.  Msk: Strength and tone appears normal for age. Extremities: No clubbing, cyanosis or edema. DP/PT/Radials 2+ and equal bilaterally. Neuro: Alert and oriented X 3. Moves all extremities spontaneously. Psych: Normal affect. No groin hematoma.   LABS:  Recent Labs     01/17/13  0400  01/18/13  0420  WBC  14.6*  11.5*  HGB  10.0*  9.8*  HCT  30.0*   29.4*  MCV  82.0  83.8  PLT  314  277    Recent Labs Lab 01/16/13 0554 01/17/13 0400 01/18/13 0420  NA 127* 131* 135  K 4.6 3.9 4.1  CL 91* 92* 98  CO2 27 26 30   BUN 24* 26* 20  CREATININE 1.08 1.13 1.09  CALCIUM 8.9 9.0 8.8  PROT  --  7.3  --   BILITOT  --  0.7  --   ALKPHOS  --  102  --   ALT  --  15  --   AST  --  50*  --   GLUCOSE 182* 122* 105*   Recent Labs     01/16/13  0852  01/16/13  1448  01/17/13  0400  TROPONINI  4.60*  11.83*  5.05*   TELE: NSR/sinus tachycardia, HR 90-100, frequent PACs, occasional PVCs  ECG: NSR, 95 bpm, downsloping ST depressions, V4-V6, I, aVL  Radiology/Studies:  Dg Chest Port 1 View  01/16/2013   *RADIOLOGY REPORT*  Clinical Data: Shortness of breath.  PORTABLE CHEST - 1 VIEW  Comparison: Chest radiograph from 05/25/2011  Findings: The lungs are well expanded.  Bilateral airspace opacification is noted, particularly at the lower lung zones.  This may reflect pneumonia or pulmonary edema.  Small bilateral pleural effusions are seen.  No  pneumothorax is seen.  Underlying vascular congestion is noted.  The cardiomediastinal silhouette is borderline normal in size.  The patient is status post median sternotomy.  A mitral valve replacement is noted.  Calcification is noted in the aortic arch. No acute osseous abnormalities are seen.  IMPRESSION: Bilateral airspace opacification, particularly at the lower lung zones.  This may reflect pneumonia or pulmonary edema.  Underlying vascular congestion noted.  Small bilateral pleural effusions seen.   Original Report Authenticated By: Tonia Ghent, M.D.    Current Medications:  . amLODipine  10 mg Oral Daily  . atorvastatin  40 mg Oral Daily  . clopidogrel  75 mg Oral Q breakfast  . diltiazem  10 mg Intravenous Once  . ferrous sulfate  325 mg Oral Q breakfast  . folic acid  1 mg Oral Daily  . furosemide      . furosemide  40 mg Oral Daily  . lisinopril  5 mg Oral Daily  . metoprolol  100 mg  Oral BID  . multivitamin with minerals  1 tablet Oral Daily  . pantoprazole  40 mg Oral Daily  . sodium chloride  3 mL Intravenous Q12H  . thiamine  100 mg Oral Daily   Or  . thiamine  100 mg Intravenous Daily  . Warfarin - Pharmacist Dosing Inpatient   Does not apply q1800    ASSESSMENT AND PLAN:  1. Atrial fibrillation with RVR- converted to NSR with frequent PACs. Stop Diltiazem drip and increase Metoprolol to 100 mg bid. He is already on anticoagulation.    2. NSTEMI/CAD s/p CABG- TnI trend 0.37->4.60->11.83->5.05. Cardiac cath showed occluded vein grafts and patent LIMA. See cath report. He is s/p PCI with DES placement to native RCA. Continue medical therapy for rest of CAD.  Continue Plavix without Aspirin given that he is on anticoagulation. After 12 months, Plavix can be changed to Aspirin 81 mg daily.   3. H/o severe AS/MS s/p St. Jude mechanical AVR- heparinized until INR is theraputic.   4. Acute unspecified CHF- Still awaiting echo. I suspect that his EF is down. I started small dose Lisinopril. Continue Metoprolol and Lasix 40 mg po once daily.    5. COPD- per the patient. Follow-up pulmonary/PCP as an outpatient. Reports using albuterol nebs only PRN. This may need to be transitioned to Xopenex given a-fib. He is not on ipratroprium or ICS.   6.  Hypertension- BP is still high. I increased his meds. Keep off Clonidine and try to maximize HF meds.   7. NSVT- likely ischemic. Happened yesterday. Continue Metoprolol.      Signed, Lorine Bears, MD, [facility]  01/18/2013, 8:27 AM

## 2013-01-19 DIAGNOSIS — J9600 Acute respiratory failure, unspecified whether with hypoxia or hypercapnia: Secondary | ICD-10-CM

## 2013-01-19 LAB — CBC
MCH: 27.3 pg (ref 26.0–34.0)
Platelets: 261 10*3/uL (ref 150–400)
RBC: 3.48 MIL/uL — ABNORMAL LOW (ref 4.22–5.81)
WBC: 8.7 10*3/uL (ref 4.0–10.5)

## 2013-01-19 LAB — PROTIME-INR
INR: 1.47 (ref 0.00–1.49)
Prothrombin Time: 17.4 seconds — ABNORMAL HIGH (ref 11.6–15.2)

## 2013-01-19 LAB — BASIC METABOLIC PANEL
CO2: 29 mEq/L (ref 19–32)
Calcium: 8.8 mg/dL (ref 8.4–10.5)
Sodium: 135 mEq/L (ref 135–145)

## 2013-01-19 MED ORDER — WARFARIN SODIUM 7.5 MG PO TABS
7.5000 mg | ORAL_TABLET | Freq: Once | ORAL | Status: AC
  Administered 2013-01-19: 7.5 mg via ORAL
  Filled 2013-01-19: qty 1

## 2013-01-19 NOTE — Progress Notes (Signed)
Patient transferred to 2911 with medications and belongings.  Accompanied by RN.  Denies pain.  Report given to receiving RN.  Care relinquished.

## 2013-01-19 NOTE — Progress Notes (Signed)
ANTICOAGULATION CONSULT NOTE - Follow Up Consult  Pharmacy Consult for Heparin/Coumadin Indication: h/o Mechanical AVR/MVR +Afib  No Known Allergies  Patient Measurements: Height: 5\' 6"  (167.6 cm) Weight: 149 lb 0.5 oz (67.6 kg) IBW/kg (Calculated) : 63.8 Heparin Dosing Weight: 67.6 kg  Vital Signs: Temp: 98.6 F (37 C) (07/17 0831) Temp src: Oral (07/17 0831) BP: 165/64 mmHg (07/17 0831) Pulse Rate: 90 (07/17 0831)  Labs:  Recent Labs  01/16/13 1448  01/17/13 0400 01/18/13 0420 01/19/13 0425  HGB  --   < > 10.0* 9.8* 9.5*  HCT  --   --  30.0* 29.4* 29.7*  PLT  --   --  314 277 261  LABPROT  --   --  20.9* 15.4* 17.4*  INR  --   --  1.86* 1.25 1.47  HEPARINUNFRC  --   --  0.42 0.67 0.53  CREATININE  --   --  1.13 1.09 1.15  TROPONINI 11.83*  --  5.05*  --   --   < > = values in this interval not displayed.  Estimated Creatinine Clearance: 56.2 ml/min (by C-G formula based on Cr of 1.15).   Assessment:  67 yo male admitted 01/16/2013 with NSTEMI.  PMH: CAD, COPD, hyperlipidemia, HTN, CKD, AVR/MVR: both [employer]; Afib, PTA warfarin: 7.5 mg Mon& Thurs, 5 mg all other days.   AntiCoag: Afib + mechanical AVR/MVR: (s/p vitamin K 3mg  IV on 7/14) Heparin level 0l53 in goal. INR 1.47. Hgb down slightly to 9.5  CV: s/p cath with DES to mid RCA. Patient was in afib but now in NSR. Hx CAD, HTN, hyperlipidemia, afib.EF 55%. Due to plavix/coumadin no ASA per MD. Meds: Norvasc 10, Lipitor, Plavix, diltiazem, lasix, lisinopril, metoprolol,  Goal of Therapy:  INR 2.5-3.5 Heparin Level 0.3-0.7 Monitor platelets by anticoagulation protocol: Yes   Plan:  Continue heparin at 1100 units/hr Repeat Coumadin 7.5mg  again today.  Merilynn Finland, [employer] 01/19/2013,11:29 AM

## 2013-01-19 NOTE — Progress Notes (Signed)
CARDIAC REHAB PHASE I   PRE:  Rate/Rhythm: 84 SR  BP:  Supine:   Sitting: 100/60  Standing:    SaO2: 91 RA  MODE:  Ambulation: 480 ft   POST:  Rate/Rhythm: 85  BP:  Supine:   Sitting: 104/50  Standing:    SaO2: 85 RA 90 RA 1235-1325 On arrival pt in recliner without c/o. Assisted X 1 to ambulate. Gait unsteady, obtained walker for pt to use. Pt much more steady with walker. Pt denies any cp of SOB with walking. Pt had slow pace with jettery movements. Pt back to recliner after walk with call light in reach. Gave pt MI  and Stent booklets. Discussed MI, activity restrictions and stent. He voices understanding. We will continue to follow pt. Also discussed smoking cessation and gave him tips for quitting and contact number. Pt states that he has quit two times before cold [place] and he plans to do the same this time. 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 Room air sat after walk 85%, pt denies any SOB, sat improved to 90% with rest and time on room air.  Melina Copa RN 01/19/2013 1:23 PM

## 2013-01-19 NOTE — Progress Notes (Signed)
Subjective: Denies CP  Says his breathing is much betterthan earlier this week. Objective: Filed Vitals:   01/18/13 2250 01/19/13 0209 01/19/13 0500 01/19/13 0831  BP: 136/72 160/65  165/64  Pulse: 78   90  Temp: 98.4 F (36.9 C)   98.6 F (37 C)  TempSrc: Oral   Oral  Resp:    18  Height:      Weight:   149 lb 0.5 oz (67.6 kg)   SpO2: 92% 95%  95%   Weight change: -5 lb 1.1 oz (-2.3 kg)  Intake/Output Summary (Last 24 hours) at 01/19/13 1014 Last data filed at 01/19/13 0600  Gross per 24 hour  Intake    698 ml  Output   1400 ml  Net   -702 ml    General: Alert, awake, oriented x3, in no acute distress Neck:  JVP is normal Heart: Regular rate and rhythm  Crisp valve sounds.  Lungs: Clear to auscultation.  No rales or wheezes. Exemities:  No edema.   Neuro: Grossly intact, nonfocal.   Lab Results: Results for orders placed during the hospital encounter of 01/16/13 (from the past 24 hour(s))  PROTIME-INR     Status: Abnormal   Collection Time    01/19/13  4:25 AM      Result Value Range   Prothrombin Time 17.4 (*) 11.6 - 15.2 seconds   INR 1.47  0.00 - 1.49  HEPARIN LEVEL (UNFRACTIONATED)     Status: None   Collection Time    01/19/13  4:25 AM      Result Value Range   Heparin Unfractionated 0.53  0.30 - 0.70 IU/mL  CBC     Status: Abnormal   Collection Time    01/19/13  4:25 AM      Result Value Range   WBC 8.7  4.0 - 10.5 K/uL   RBC 3.48 (*) 4.22 - 5.81 MIL/uL   Hemoglobin 9.5 (*) 13.0 - 17.0 g/dL   HCT 16.1 (*) 09.6 - 04.5 %   MCV 85.3  78.0 - 100.0 fL   MCH 27.3  26.0 - 34.0 pg   MCHC 32.0  30.0 - 36.0 g/dL   RDW 40.9 (*) 81.1 - 91.4 %   Platelets 261  150 - 400 K/uL  BASIC METABOLIC PANEL     Status: Abnormal   Collection Time    01/19/13  4:25 AM      Result Value Range   Sodium 135  135 - 145 mEq/L   Potassium 4.1  3.5 - 5.1 mEq/L   Chloride 97  96 - 112 mEq/L   CO2 29  19 - 32 mEq/L   Glucose, Bld 97  70 - 99 mg/dL   BUN 22  6 - 23 mg/dL   Creatinine, Ser 7.82  0.50 - 1.35 mg/dL   Calcium 8.8  8.4 - 95.6 mg/dL   GFR calc non Af Amer 64 (*) >90 mL/min   GFR calc Af Amer 74 (*) >90 mL/min    Studies/Results: @RISRSLT24 @  Medications: Reviewed  Full report in CV section.    Echo  LVEF 55%  AVR mean gradient 12 mm Hg.  MVR mean gradient 6 mm Hg  Mod LAE  RV normal. @PROBHOSP @  1.  NSTEMI  Cath showed occluded vein grafts.  LIMA patent.  Underwent PCI/DES to RCA.   Continue Plavix.No ASA  At 12 mon dc plavix and resume 81 mg ASA  2.  AVR/MVR  On heparin  Coumadin  initiated.  3.  CHF.  CLinically improved.    4.  Afib  Currently in SR  Follow  On anticoag  5.  HTN  Up and down  Follow   Tx to floor.  Cardiac rehab to work with patient.   LOS: 3 days   Dietrich Pates 01/19/2013, 10:14 AM

## 2013-01-20 LAB — CBC
MCH: 28 pg (ref 26.0–34.0)
MCV: 85.1 fL (ref 78.0–100.0)
Platelets: 279 10*3/uL (ref 150–400)
RDW: 16.9 % — ABNORMAL HIGH (ref 11.5–15.5)
WBC: 7.7 10*3/uL (ref 4.0–10.5)

## 2013-01-20 LAB — PRO B NATRIURETIC PEPTIDE: Pro B Natriuretic peptide (BNP): 6499 pg/mL — ABNORMAL HIGH (ref 0–125)

## 2013-01-20 LAB — PROTIME-INR: Prothrombin Time: 20.6 seconds — ABNORMAL HIGH (ref 11.6–15.2)

## 2013-01-20 MED ORDER — WARFARIN SODIUM 7.5 MG PO TABS
7.5000 mg | ORAL_TABLET | Freq: Once | ORAL | Status: AC
  Administered 2013-01-20: 7.5 mg via ORAL
  Filled 2013-01-20: qty 1

## 2013-01-20 NOTE — Progress Notes (Signed)
Clinical Social Work Department BRIEF PSYCHOSOCIAL ASSESSMENT 01/20/2013  Patient:  [name], [name]     Account Number:  [id number]     Admit date:  01/16/2013  Clinical Social Worker:  Carren Rang  Date/Time:  01/20/2013 02:19 PM  Referred by:  Care Management  Date Referred:  01/20/2013 Referred for  Substance Abuse   Other Referral:   Interview type:  Patient Other interview type:    PSYCHOSOCIAL DATA Living Status:  ALONE Admitted from facility:   Level of care:   Primary support name:  Danny Primary support relationship to patient:  SIBLING Degree of support available:   Good    CURRENT CONCERNS Current Concerns  Substance Abuse   Other Concerns:    SOCIAL WORK ASSESSMENT / PLAN Clinical Social Worker received referral for alcohol use. 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 Patient does not feel he has a problem with alcohol use and in fact states he can go without any time he thinks it's a problem. Patient states that he is a [place] vet and has lost his wife recently, so he tries to keep busy. Patient states he is doing fine and has a lot of support. CSW gave patient resource booklet for alcohol use.   Assessment/plan status:  Psychosocial Support/Ongoing Assessment of Needs Other assessment/ plan:   Information/referral to community resources:   Alcohol Resource booklet    PATIENT'S/FAMILY'S RESPONSE TO PLAN OF CARE: Patient appeared to be in good spirits and states he is doing fine. He states he has a lot of support. He lives next door to his brother in law and has his wife's family as support.       Maree Krabbe, MSW, Theresia Majors [phone]

## 2013-01-20 NOTE — Progress Notes (Addendum)
CARDIAC REHAB PHASE I   PRE:  Rate/Rhythm: 72 SR  BP:  Supine:   Sitting: 100/60  Standing:    SaO2: 93 RA  MODE:  Ambulation: 530 ft   POST:  Rate/Rhythm: 81  BP:  Supine:   Sitting: 120/60  Standing:    SaO2: 91 RA 1445-1520 Assisted X 1 and used walker to ambulate. Gait steady with walker. Pt stronger today and less jittery. Pt able to walk 530 feet without  c/o of cp or SOB. VS stable Room air sat after walk today 91%. Pt back to side of bed after walk. Completed MI education with pt. He voices understanding. Pt declines Outpt. CRP, not interested. Discussed smoking cessation with pt. I gave him tips for quitting and coaching contact number. He states "I am going to try to quit". Gave pt encouragement to quit.  Melina Copa RN 01/20/2013 3:50 PM

## 2013-01-20 NOTE — Progress Notes (Signed)
    Subjective:  Feels well. No chest pain or dyspnea.   Objective:  Vital Signs in the last 24 hours: Temp:  [98 F (36.7 C)-98.6 F (37 C)] 98.5 F (36.9 C) (07/18 0521) Pulse Rate:  [72-90] 77 (07/18 0521) Resp:  [18-20] 18 (07/18 0521) BP: (113-165)/(61-80) 145/64 mmHg (07/18 0521) SpO2:  [92 %-95 %] 94 % (07/18 0521) Weight:  [67.042 kg (147 lb 12.8 oz)] 67.042 kg (147 lb 12.8 oz) (07/18 0615)  Intake/Output from previous day: 07/17 0701 - 07/18 0700 In: 585 [P.O.:480; I.V.:105] Out: 1050 [Urine:1050]  Physical Exam: Pt is alert and oriented, elderly male in NAD HEENT: normal Neck: JVP - normal, carotids 2+=  Lungs: CTA bilaterally CV: RRR with normal mechanical heart sounds Abd: soft, NT, Positive BS, no hepatomegaly Ext: no C/C/E, groin site tender but no hematoma  Skin: warm/dry no rash   Lab Results:  Recent Labs  01/19/13 0425 01/20/13 0410  WBC 8.7 7.7  HGB 9.5* 9.4*  PLT 261 279    Recent Labs  01/18/13 0420 01/19/13 0425  NA 135 135  K 4.1 4.1  CL 98 97  CO2 30 29  GLUCOSE 105* 97  BUN 20 22  CREATININE 1.09 1.15   No results found for this basename: TROPONINI, CK, MB,  in the last 72 hours  Cardiac Studies: 2D Echo: Study Conclusions  - Left ventricle: The cavity size was normal. Wall thickness was increased in a pattern of mild LVH. The estimated ejection fraction was 55%. Wall motion was normal; there were no regional wall motion abnormalities. Indeterminant diastolic function. - Aortic valve: Mechanical aortic valve prosthesis. No prosthetic valve stenosis or significant regurgitation. Mean gradient: 12mm Hg (S). - Mitral valve: Mechanical mitral valve prosthesis. There does not appear to be significant regurgitation and there is no significant prosthetic valve stenosis. Pressure half-time: 68ms. Mean gradient: 6mm Hg (D). - Left atrium: The atrium was moderately dilated. - Right ventricle: The cavity size was normal.  Systolic function was normal. - Right atrium: The atrium was mildly dilated. - Pulmonary arteries: No complete TR doppler jet so unable to estimate PA systolic pressure. - Inferior vena cava: The vessel was normal in size; the respirophasic diameter changes were in the normal range (= 50%); findings are consistent with normal central venous pressure. Impressions:  - Normal LV size and systolic function with mild LV hypertrophy. EF 55%. Normal RV size and systolic function. Mechanical aortic and mitral valves appeared to function normally.  Tele: Sinus rhythm  Assessment/Plan:  1. Atrial fib, now in sinus rhythm. On warfarin with mechanical valves 2. NSTEMI - SVG closure, patent LIMA, now s/p PCI of the native RCA 3. Acute diastolic CHF. Echo results reviewed. Normal mechanical prosthetic valve function. Meds reviewed and appropriate. On maintenance lasix. Renal function stable. 4. Malignant HTN - BP's reviewed and control is better. Would keep on amlodipine, lisinopril, metoprolol, and lasix. 5. Dispo: INR trending up. With 2 mechanical valves, should remain on IV heparin until INR at least >2.0.  Tonny Bollman, M.D. 01/20/2013, 6:40 AM

## 2013-01-20 NOTE — Progress Notes (Signed)
ANTICOAGULATION CONSULT NOTE - Follow Up Consult  Pharmacy Consult for Heparin/Coumadin Indication: h/o Mechanical AVR/MVR +Afib  No Known Allergies  Patient Measurements: Height: 5\' 6"  (167.6 cm) Weight: 147 lb 12.8 oz (67.042 kg) IBW/kg (Calculated) : 63.8 Heparin Dosing Weight: 67.6 kg  Vital Signs: Temp: 98.5 F (36.9 C) (07/18 0521) Temp src: Oral (07/18 0521) BP: 145/64 mmHg (07/18 0521) Pulse Rate: 77 (07/18 0521)  Labs:  Recent Labs  01/18/13 0420 01/19/13 0425 01/20/13 0410  HGB 9.8* 9.5* 9.4*  HCT 29.4* 29.7* 28.6*  PLT 277 261 279  LABPROT 15.4* 17.4* 20.6*  INR 1.25 1.47 1.83*  HEPARINUNFRC 0.67 0.53 0.51  CREATININE 1.09 1.15  --     Estimated Creatinine Clearance: 56.2 ml/min (by C-G formula based on Cr of 1.15).   Assessment:  67 yo male admitted 01/16/2013 with NSTEMI and s/p cath with DES to RCA.  Heparin level is at goal, INR still subtherapeutic.  Patient noted s/p vitamin K 3mg  IV on 7/14.  No bleeding noted. PTA warfarin: 7.5 mg Mon& Thurs, 5 mg all other days.    Goal of Therapy:  INR 2.5-3.5 Heparin Level 0.3-0.7 Monitor platelets by anticoagulation protocol: Yes   Plan:  Continue heparin at 1100 units/hr Repeat Coumadin 7.5mg  again today. Daily PT/INR, HL and CBC.  Wendie Simmer, PharmD, BCPS Clinical Pharmacist  Pager: [phone]

## 2013-01-21 LAB — CBC
HCT: 27.8 % — ABNORMAL LOW (ref 39.0–52.0)
MCH: 28 pg (ref 26.0–34.0)
MCHC: 33.1 g/dL (ref 30.0–36.0)
MCV: 84.5 fL (ref 78.0–100.0)
RDW: 17.1 % — ABNORMAL HIGH (ref 11.5–15.5)

## 2013-01-21 MED ORDER — WARFARIN SODIUM 7.5 MG PO TABS
7.5000 mg | ORAL_TABLET | Freq: Once | ORAL | Status: AC
  Administered 2013-01-21: 7.5 mg via ORAL
  Filled 2013-01-21: qty 1

## 2013-01-21 NOTE — Progress Notes (Signed)
Patient ID: Rannie Craney., male   DOB: 1946-03-30, 67 y.o.   MRN: [id number]   Patient Name: [name]. Date of Encounter: 01/21/2013    SUBJECTIVE  No complaints this morning. He has been coughing up some purulent sputum mixed with a little blood. No fever, no chills, and white count is normal. Chest x-ray on 716 showed resolving pulmonary edema. He denies shortness of breath. INR today is 1.97.    CURRENT MEDS . amLODipine  10 mg Oral Daily  . atorvastatin  40 mg Oral Daily  . clopidogrel  75 mg Oral Q breakfast  . ferrous sulfate  325 mg Oral Q breakfast  . folic acid  1 mg Oral Daily  . furosemide  40 mg Oral Daily  . lisinopril  5 mg Oral Daily  . metoprolol  100 mg Oral BID  . multivitamin with minerals  1 tablet Oral Daily  . pantoprazole  40 mg Oral Daily  . sodium chloride  3 mL Intravenous Q12H  . thiamine  100 mg Oral Daily  . Warfarin - Pharmacist Dosing Inpatient   Does not apply q1800    OBJECTIVE  Filed Vitals:   01/20/13 0900 01/20/13 1354 01/20/13 2006 01/21/13 0415  BP: 123/56 126/95 167/68 147/66  Pulse: 85 79 85 84  Temp:  98.3 F (36.8 C) 98.2 F (36.8 C) 98.4 F (36.9 C)  TempSrc:  Oral Oral Oral  Resp: 18 18 18 18   Height:      Weight:    147 lb 9.6 oz (66.951 kg)  SpO2: 99% 95% 94% 97%    Intake/Output Summary (Last 24 hours) at 01/21/13 0816 Last data filed at 01/21/13 0418  Gross per 24 hour  Intake    720 ml  Output   1200 ml  Net   -480 ml   Filed Weights   01/20/13 0500 01/20/13 0615 01/21/13 0415  Weight: 147 lb 12.8 oz (67.042 kg) 147 lb 12.8 oz (67.042 kg) 147 lb 9.6 oz (66.951 kg)    PHYSICAL EXAM  General: Pleasant, NAD.frail and looks much older than stated age Neuro: Alert and oriented X 3. Moves all extremities spontaneously. Psych: Normal affect. HEENT:  Normal  Neck: Supple without bruits or JVD. Lungs:  Resp regular and unlabored, decreased breath sounds throughout. Heart: RRR no s3, s4,mechanical  S1-S2 Abdomen: Soft, non-tender, non-distended, BS + x 4.  Extremities: No clubbing, cyanosis or edema. DP/PT/Radials 2+ and equal bilaterally.  Accessory Clinical Findings  CBC  Recent Labs  01/20/13 0410 01/21/13 0500  WBC 7.7 8.1  HGB 9.4* 9.2*  HCT 28.6* 27.8*  MCV 85.1 84.5  PLT 279 272   Basic Metabolic Panel  Recent Labs  01/19/13 0425  NA 135  K 4.1  CL 97  CO2 29  GLUCOSE 97  BUN 22  CREATININE 1.15  CALCIUM 8.8   Liver Function Tests No results found for this basename: AST, ALT, ALKPHOS, BILITOT, PROT, ALBUMIN,  in the last 72 hours No results found for this basename: LIPASE, AMYLASE,  in the last 72 hours Cardiac Enzymes No results found for this basename: CKTOTAL, CKMB, CKMBINDEX, TROPONINI,  in the last 72 hours BNP No components found with this basename: POCBNP,  D-Dimer No results found for this basename: DDIMER,  in the last 72 hours Hemoglobin A1C No results found for this basename: HGBA1C,  in the last 72 hours Fasting Lipid Panel No results found for this basename: CHOL, HDL, LDLCALC, TRIG,  CHOLHDL, LDLDIRECT,  in the last 72 hours Thyroid Function Tests No results found for this basename: TSH, T4TOTAL, FREET3, T3FREE, THYROIDAB,  in the last 72 hours  TELE  Normal sinus rhythm  ECG    Radiology/Studies  Dg Chest Port 1 View  01/18/2013   *RADIOLOGY REPORT*  Clinical Data: Progressive hypoxia.  Evaluate for potential congestive heart failure.  PORTABLE CHEST - 1 VIEW  Comparison: Chest x-ray 01/16/2013.  Findings: Compared to the prior examination in multifocal interstitial and airspace disease has slightly improved compared to the prior study.  There continues to be some cephalization of the pulmonary vasculature.  Potential trace bilateral pleural effusions.  Heart size is mildly enlarged.  Upper mediastinal contours are within normal limits.  Atherosclerosis in the thoracic aorta.  Status post median sternotomy for CABG, as well as  aortic and mitral valve replacement.  IMPRESSION: 1.  Improving aeration throughout the lungs bilaterally.  This is favored to represent resolving pulmonary edema, however, some component of superimposed infection is not excluded. 2.  Mild cardiomegaly is unchanged. 3.  Atherosclerosis. 4.  Postoperative changes, as above.   Original Report Authenticated By: Trudie Reed, M.D.   Dg Chest Port 1 View  01/16/2013   *RADIOLOGY REPORT*  Clinical Data: Shortness of breath.  PORTABLE CHEST - 1 VIEW  Comparison: Chest radiograph from 05/25/2011  Findings: The lungs are well expanded.  Bilateral airspace opacification is noted, particularly at the lower lung zones.  This may reflect pneumonia or pulmonary edema.  Small bilateral pleural effusions are seen.  No pneumothorax is seen.  Underlying vascular congestion is noted.  The cardiomediastinal silhouette is borderline normal in size.  The patient is status post median sternotomy.  A mitral valve replacement is noted.  Calcification is noted in the aortic arch. No acute osseous abnormalities are seen.  IMPRESSION: Bilateral airspace opacification, particularly at the lower lung zones.  This may reflect pneumonia or pulmonary edema.  Underlying vascular congestion noted.  Small bilateral pleural effusions seen.   Original Report Authenticated By: Tonia Ghent, M.D.    ASSESSMENT AND PLAN  Active Problems:   HYPERLIPIDEMIA   ANEMIA   CHRONIC OBSTRUCTIVE PULMONARY DISEASE   MITRAL VALVE REPLACEMENT, HX OF   AORTIC VALVE REPLACEMENT, HX OF   Chronic anticoagulation   Arteriosclerotic cardiovascular disease (ASCVD)   Hypertension   GERD (gastroesophageal reflux disease)   Atrial fibrillation with RVR   NSTEMI (non-ST elevated myocardial infarction)   Non-sustained ventricular tachycardia   Acute respiratory failure with hypoxia    INR almost therapeutic.maintaining sinus rhythm. Sputum is a little worrisome but afebrile and white count is normal.  Chest x-ray is improving. We'll continue to monitor. Home perhaps tomorrow.  Signed, Valera Castle MD

## 2013-01-21 NOTE — Progress Notes (Signed)
3:25 pm,Attempted walk with Cardiac Rehab, walking independently, he has been coughing up bloody sputum, say MD aware, waiting for his blood levels for Coumadin to be at acceptable limits then home.  Sign off Cardiac Rehab Cathie Olden RN

## 2013-01-21 NOTE — Progress Notes (Signed)
ANTICOAGULATION CONSULT NOTE - Follow Up Consult  Pharmacy Consult for Heparin/Coumadin Indication: h/o Mechanical AVR/MVR +Afib  No Known Allergies  Patient Measurements: Height: 5\' 6"  (167.6 cm) Weight: 147 lb 9.6 oz (66.951 kg) IBW/kg (Calculated) : 63.8 Heparin Dosing Weight: 67.6 kg  Vital Signs: Temp: 98.4 F (36.9 C) (07/19 0415) Temp src: Oral (07/19 0415) BP: 147/66 mmHg (07/19 0415) Pulse Rate: 84 (07/19 0415)  Labs:  Recent Labs  01/19/13 0425 01/20/13 0410 01/21/13 0500  HGB 9.5* 9.4* 9.2*  HCT 29.7* 28.6* 27.8*  PLT 261 279 272  LABPROT 17.4* 20.6* 21.8*  INR 1.47 1.83* 1.97*  HEPARINUNFRC 0.53 0.51 0.46  CREATININE 1.15  --   --     Estimated Creatinine Clearance: 56.2 ml/min (by C-G formula based on Cr of 1.15).   Assessment:  67 yo male admitted 01/16/2013 with NSTEMI and s/p cath with DES to RCA.  Heparin level is at goal, INR still subtherapeutic, but increasing slowly.  Patient noted s/p vitamin K 3mg  IV on 7/14.  No bleeding noted. PTA warfarin: 7.5 mg Mon& Thurs, 5 mg all other days.    Goal of Therapy:  INR 2.5-3.5 Heparin Level 0.3-0.7 Monitor platelets by anticoagulation protocol: Yes   Plan:  Continue heparin at 1100 units/hr Coumadin 7.5mg  again today. Daily PT/INR, HL and CBC.  Wendie Simmer, PharmD, BCPS Clinical Pharmacist  Pager: [phone]

## 2013-01-22 LAB — CBC
HCT: 27.1 % — ABNORMAL LOW (ref 39.0–52.0)
Hemoglobin: 8.7 g/dL — ABNORMAL LOW (ref 13.0–17.0)
MCH: 27.7 pg (ref 26.0–34.0)
MCHC: 32.1 g/dL (ref 30.0–36.0)

## 2013-01-22 MED ORDER — THIAMINE HCL 100 MG PO TABS: 100.0000 mg | ORAL_TABLET | Freq: Every day | ORAL | Status: AC

## 2013-01-22 MED ORDER — WARFARIN SODIUM 5 MG PO TABS: 7.5000 mg | ORAL_TABLET | Freq: Every day | ORAL | Status: DC

## 2013-01-22 MED ORDER — CLOPIDOGREL BISULFATE 75 MG PO TABS: 75.0000 mg | ORAL_TABLET | Freq: Every day | ORAL | Status: DC

## 2013-01-22 MED ORDER — FOLIC ACID 1 MG PO TABS: 1.0000 mg | ORAL_TABLET | Freq: Every day | ORAL | Status: AC

## 2013-01-22 MED ORDER — METOPROLOL TARTRATE 50 MG PO TABS: 100.0000 mg | ORAL_TABLET | Freq: Two times a day (BID) | ORAL | Status: DC

## 2013-01-22 MED ORDER — LISINOPRIL 5 MG PO TABS: 20.0000 mg | ORAL_TABLET | Freq: Every day | ORAL | Status: DC

## 2013-01-22 MED ORDER — AMLODIPINE BESYLATE 10 MG PO TABS: 10.0000 mg | ORAL_TABLET | Freq: Every day | ORAL | Status: DC

## 2013-01-22 MED ORDER — ACETAMINOPHEN 325 MG PO TABS: 650.0000 mg | ORAL_TABLET | Freq: Four times a day (QID) | ORAL | Status: DC | PRN

## 2013-01-22 MED ORDER — LEVALBUTEROL HCL 0.63 MG/3ML IN NEBU: 0.6300 mg | INHALATION_SOLUTION | Freq: Four times a day (QID) | RESPIRATORY_TRACT | Status: AC | PRN

## 2013-01-22 MED ORDER — ADULT MULTIVITAMIN W/MINERALS CH: 1.0000 | ORAL_TABLET | Freq: Every day | ORAL | Status: AC

## 2013-01-22 MED ORDER — FUROSEMIDE 40 MG PO TABS: 40.0000 mg | ORAL_TABLET | Freq: Every day | ORAL | Status: DC

## 2013-01-22 NOTE — Discharge Summary (Signed)
CARDIOLOGY DISCHARGE SUMMARY    Patient ID: [name].,  MRN: [id number], DOB/AGE: [birth year] 67 y.o.  Admit date: 01/16/2013 Discharge date: 01/22/2013  Primary Care Physician: Kari Baars, MD Primary Cardiologist: Woodward Bing, MD  Primary Discharge Diagnosis:  1. Atrial fibrillation, now in SR 2. NSTEMI - SVG closure, patent LIMA, now s/p PCI to native RCA 3. Acute diastolic HF 4. Anemia  Secondary Discharge Diagnoses:  1. Valvular heart disease s/p mechanical AVR and MVR 2. HTN 3. Alcohol abuse - seen by Social Work (please see consult) and given information regarding community resources for substance abuse and counseling  4. Tobacco abuse 5. GERD  Procedures This Admission:  1. Left heart catheterization Procedural Findings:  Hemodynamics:  AO: 145/59 mmHg  Coronary angiography:  Coronary dominance:  Left Main: Short in with no significant disease.  Left Anterior Descending (LAD): Normal in size with moderate calcifications proximally. There is 50% disease proximally just before the diagonal branch. The vessel is very tortuous in the midsegment with diffuse 40-50% disease. There is competitive flow noted distally from a patent LIMA.  1st diagonal (D1): Large in size with 50% ostial and proximal disease. This vessel is large and supplies most of the anterolateral Elyan Vanwieren.  2nd diagonal (D2): Very small in size.  3rd diagonal (D3): Very small in size.  Circumflex (LCx): Normal in size and nondominant. The vessel is mildly calcified. There is 30 % tubular disease in the midsegment.  1st obtuse marginal: Very small in size with minor irregularities.  2nd obtuse marginal: Small in size with 60% ostial disease.  3rd obtuse marginal: Normal in size with 80% proximal disease. The vessel is very tortuous in that segment and distally.  AV groove continuation segment: Normal in size with minor irregularities.  Right Coronary Artery: Normal in size and dominant. The  vessel is moderately calcified throughout its course. There is diffuse 50% disease in the midsegment. There is another lesion of 99% in the midsegment just before giving a large RV branch. There is 50% disease distally.  Posterior descending artery: Normal in size with minor irregularities.  Posterior AV segment: Small in size. SVG to diagonal: Occluded proximally. I suspect that this is the culprit for non-ST elevation MI due to dye stain noted.  SVG to right PDA: Occluded proximally.  SVG to OM: Is occluded proximally.  LIMA to LAD: Patent with no significant disease.  Left ventriculography: Was not performed.  PCI Data:  Vessel - mid RCA/Segment - 2  Percent Stenosis (pre) 99%  TIMI-flow 3  Stent 2.5 x 20 mm Promus drug-eluting stent  Percent Stenosis (post) 0%  TIMI-flow (post) 3  Final Conclusions:  1. Significant three-vessel coronary artery disease with occluded vein grafts. Patent LIMA to LAD. The culprit for non-ST elevation myocardial infarction is likely occluded SVG to diagonal. However, the native diagonal is large and does not seem to have obstructive disease. There is significant disease in proximal OM 3. However, the vessel is tortuous and calcified. Severe 99% stenosis in mid RCA at the bifurcation with a large RV branch.  2. Successful angioplasty and drug-eluting stent placement to the mid RCA.  Recommendations:  I recommend treatment with Plavix and warfarin alone without aspirin to minimize risk of bleeding. I will resume heparin 8 hours from now and resume warfarin as well. Obtain an echocardiogram to evaluate LV systolic function and the function of mechanical valves. Recommend rate control for atrial fibrillation. I increased the dose of metoprolol. Recommend medical  therapy for disease in OM3 which is tortuous and calcified.   2. 2-D echo Study Conclusions - Left ventricle: The cavity size was normal. Marsel Gail thickness was increased in a pattern of mild LVH. The  estimated ejection fraction was 55%. Ellenor Wisniewski motion was normal; there were no regional Yehudit Fulginiti motion abnormalities. Indeterminant diastolic function. - Aortic valve: Mechanical aortic valve prosthesis. No prosthetic valve stenosis or significant regurgitation. Mean gradient: 12mm Hg (S). - Mitral valve: Mechanical mitral valve prosthesis. There does not appear to be significant regurgitation and there is no significant prosthetic valve stenosis. Pressure half-time: 68ms. Mean gradient: 6mm Hg (D). - Left atrium: The atrium was moderately dilated. - Right ventricle: The cavity size was normal. Systolic function was normal. - Right atrium: The atrium was mildly dilated. - Pulmonary arteries: No complete TR doppler jet so unable to estimate PA systolic pressure. - Inferior vena cava: The vessel was normal in size; the respirophasic diameter changes were in the normal range (= 50%); findings are consistent with normal central venous pressure. Impression: - Normal LV size and systolic function with mild LV hypertrophy. EF 55%. Normal RV size and systolic function. Mechanical aortic and mitral valves appeared to function normally.  History and Hospital Course:  [name] is a 67 year old man with an extensive cardiac history including CAD s/p CABG, valvular heart disease s/p mechanical AVR and MVR, chronic diastolic HF and AF who presented on 01/16/2013 with CP and SOB, found to have rapid AF with dynamic ECG changes (admission ECG dated incorrectly; see ECG dated 01/17/2013 at 13:04), elevated troponin and acute diastolic HF. He was admitted and started on IV diltiazem for rate control and IV Lasix. Cardiac catheterization was recommended in setting of dynamic ECG changes and positive troponin. His Coumadin was held and he was bridged with IV heparin with Pharmacy assistance. On 01/17/2013 he underwent cardiac catheterization, with details as outlined above. Ultimately found SVG closure, patent  LIMA and he underwent DES to native mid RCA. Medical therapy was recommended for residual disease. An echo was done showing preserved LV systolic function and normally functioning mechanical valves. Coumadin was restarted along with IV heparin for bridging until his INR was therapeutic. His HF regimen was optimized over several days. He experienced intermittent hypoxia which responded to diuresis. He converted to SR. His renal function remained stable. He was transitioned back to his maintenance PO Lasix dose. 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 He did not feel alcohol abuse was a problem for him. He was seen in consultation by Social Work and provided information regarding community resources for substance abuse. He was also counseled regarding the importance of smoking cessation. In addition, he was seen by Cardiac Rehab and offered outpatient CRP; however, he declined. Today his INR is 2.26. Dr. Daleen Squibb has deemed this stable for discharge on Coumadin without bridging. IV heparin has been discontinued. Per Pharmacy, he will go home on Coumadin 7.5 mg daily until seen He is feeling well, remains hemodynamically stable and eager to go home. Of note, his Hgb dropped to 8.7 on anticoagulation without any obvious signs/source of bleeding. He will have an INR and CBC drawn on Wednesday, 01/25/2013 and follow-up with Dr. Dietrich Pates next week. He has been seen, examined and deemed stable for discharge home today by Dr. Valera Castle.   Discharge Vitals: Blood pressure 147/78, pulse 73, temperature 98.9 F (37.2 C), temperature source Oral, resp. rate  18, height 5\' 6"  (1.676 m), weight 147 lb 14.4 oz (67.087 kg), SpO2 94.00%.   Labs: Lab Results  Component Value Date   WBC 6.1 01/22/2013   HGB 8.7* 01/22/2013   HCT 27.1* 01/22/2013   MCV 86.3 01/22/2013   PLT 267 01/22/2013    Recent Labs Lab 01/17/13 0400  01/19/13 0425  NA 131*  < > 135  K 3.9  < > 4.1    CL 92*  < > 97  CO2 26  < > 29  BUN 26*  < > 22  CREATININE 1.13  < > 1.15  CALCIUM 9.0  < > 8.8  PROT 7.3  --   --   BILITOT 0.7  --   --   ALKPHOS 102  --   --   ALT 15  --   --   AST 50*  --   --   GLUCOSE 122*  < > 97  < > = values in this interval not displayed. Lab Results  Component Value Date   CKTOTAL 206 09/04/2009   CKMB 3.5 09/04/2009   TROPONINI 5.05* 01/17/2013     Recent Labs  01/22/13 0422  INR 2.26*    Disposition:  The patient is being discharged in stable condition.  Follow-up:     Follow-up Information   Follow up with [facility] On 01/25/2013. (At 9:20 AM for coumadin follow-up (will also need CBC to follow-up anemia))    Contact information:   [address] [place] [address] [phone]      Follow up with Bates Bing, MD In 1 week. (The office will call with your appt date and time)    Contact information:   [address] [place] [address] [phone]      Discharge Medications:    Medication List    STOP taking these medications       albuterol (2.5 MG/3ML) 0.083% nebulizer solution  Commonly known as:  PROVENTIL     albuterol 108 (90 BASE) MCG/ACT inhaler  Commonly known as:  PROVENTIL HFA;VENTOLIN HFA     aspirin 81 MG EC tablet     cloNIDine 0.1 MG tablet  Commonly known as:  CATAPRES     potassium chloride SA 20 MEQ tablet  Commonly known as:  K-DUR,KLOR-CON      TAKE these medications       acetaminophen 325 MG tablet  Commonly known as:  TYLENOL  Take 2 tablets (650 mg total) by mouth every 6 (six) hours as needed. For pain     amLODipine 10 MG tablet  Commonly known as:  NORVASC  Take 1 tablet (10 mg total) by mouth daily.     atorvastatin 40 MG tablet  Commonly known as:  LIPITOR  Take 40 mg by mouth daily.     clopidogrel 75 MG tablet  Commonly known as:  PLAVIX  Take 1 tablet (75 mg total) by mouth daily with breakfast.     ferrous sulfate 325 (65 FE) MG EC tablet  Take  325 mg by mouth daily with breakfast.     folic acid 1 MG tablet  Commonly known as:  FOLVITE  Take 1 tablet (1 mg total) by mouth daily.     furosemide 40 MG tablet  Commonly known as:  LASIX  Take 1 tablet (40 mg total) by mouth daily.     HYDROcodone-acetaminophen 10-500 MG per tablet  Commonly known as:  LORTAB  Take 1 tablet by  mouth 4 times daily as needed for pain.     levalbuterol 0.63 MG/3ML nebulizer solution  Commonly known as:  XOPENEX  Take 3 mLs (0.63 mg total) by nebulization every 6 (six) hours as needed for wheezing or shortness of breath.     lisinopril 5 MG tablet  Commonly known as:  PRINIVIL,ZESTRIL  Take 4 tablets (20 mg total) by mouth daily.     metoprolol 50 MG tablet  Commonly known as:  LOPRESSOR  Take 2 tablets (100 mg total) by mouth 2 (two) times daily.     multivitamin with minerals Tabs  Take 1 tablet by mouth daily.     omeprazole 20 MG capsule  Commonly known as:  PRILOSEC  Take 40 mg by mouth daily.     thiamine 100 MG tablet  Take 1 tablet (100 mg total) by mouth daily.     warfarin 5 MG tablet  Commonly known as:  COUMADIN  Take 1.5 tablets (7.5 mg total) by mouth daily. Return to [facility] for INR on Wed, 01/25/2013 at 9:20 AM.       Duration of Discharge Encounter: Greater than 30 minutes including physician time.  Limmie Patricia, PA-C 01/22/2013, 11:23 AM Jesse Sans. Daleen Squibb, MD, [facility] Pager:  [phone]

## 2013-01-22 NOTE — Progress Notes (Signed)
Patient ID: [name]., male   DOB: 02-25-1946, 67 y.o.   MRN: [id number]   Patient Name: [name]. Date of Encounter: 01/22/2013    SUBJECTIVE  No shortness of breath or chest pain. Still coughing up a little sputum but much less. No fever and white count is normal. Coumadin is therapeutic today. He said he shows a hemoglobin dropped to 8.7. No obvious sign of bleeding.  CURRENT MEDS . amLODipine  10 mg Oral Daily  . atorvastatin  40 mg Oral Daily  . clopidogrel  75 mg Oral Q breakfast  . ferrous sulfate  325 mg Oral Q breakfast  . folic acid  1 mg Oral Daily  . furosemide  40 mg Oral Daily  . lisinopril  5 mg Oral Daily  . metoprolol  100 mg Oral BID  . multivitamin with minerals  1 tablet Oral Daily  . pantoprazole  40 mg Oral Daily  . sodium chloride  3 mL Intravenous Q12H  . thiamine  100 mg Oral Daily  . Warfarin - Pharmacist Dosing Inpatient   Does not apply q1800    OBJECTIVE  Filed Vitals:   01/21/13 0415 01/21/13 1443 01/21/13 1931 01/22/13 0426  BP: 147/66 129/44 114/96 147/78  Pulse: 84 70 79 73  Temp: 98.4 F (36.9 C) 97.8 F (36.6 C) 98.6 F (37 C) 98.9 F (37.2 C)  TempSrc: Oral Oral Oral Oral  Resp: 18 18 18 18   Height:      Weight: 147 lb 9.6 oz (66.951 kg)   147 lb 14.4 oz (67.087 kg)  SpO2: 97% 98% 97% 94%    Intake/Output Summary (Last 24 hours) at 01/22/13 0841 Last data filed at 01/21/13 1100  Gross per 24 hour  Intake    480 ml  Output      0 ml  Net    480 ml   Filed Weights   01/20/13 0615 01/21/13 0415 01/22/13 0426  Weight: 147 lb 12.8 oz (67.042 kg) 147 lb 9.6 oz (66.951 kg) 147 lb 14.4 oz (67.087 kg)    PHYSICAL EXAM  General: Pleasant, NAD.chronically ill Neuro: Alert and oriented X 3. Moves all extremities spontaneously. Psych: Normal affect. HEENT:  Normal  Neck: Supple without bruits or JVD. Lungs:  Resp regular and unlabored, CTA. Heart: RRR , mechanical S1-S2 Abdomen: Soft, non-tender, non-distended,  BS + x 4.  Extremities: No clubbing, cyanosis or edema. DP/PT/Radials 2+ and equal bilaterally.  Accessory Clinical Findings  CBC  Recent Labs  01/21/13 0500 01/22/13 0422  WBC 8.1 6.1  HGB 9.2* 8.7*  HCT 27.8* 27.1*  MCV 84.5 86.3  PLT 272 267   Basic Metabolic Panel No results found for this basename: NA, K, CL, CO2, GLUCOSE, BUN, CREATININE, CALCIUM, MG, PHOS,  in the last 72 hours Liver Function Tests No results found for this basename: AST, ALT, ALKPHOS, BILITOT, PROT, ALBUMIN,  in the last 72 hours No results found for this basename: LIPASE, AMYLASE,  in the last 72 hours Cardiac Enzymes No results found for this basename: CKTOTAL, CKMB, CKMBINDEX, TROPONINI,  in the last 72 hours BNP No components found with this basename: POCBNP,  D-Dimer No results found for this basename: DDIMER,  in the last 72 hours Hemoglobin A1C No results found for this basename: HGBA1C,  in the last 72 hours Fasting Lipid Panel No results found for this basename: CHOL, HDL, LDLCALC, TRIG, CHOLHDL, LDLDIRECT,  in the last 72 hours Thyroid Function Tests No  results found for this basename: TSH, T4TOTAL, FREET3, T3FREE, THYROIDAB,  in the last 72 hours  TELE  Chronic A. fib  ECG    Radiology/Studies  Dg Chest [facility] 1 View  01/18/2013   *RADIOLOGY REPORT*  Clinical Data: Progressive hypoxia.  Evaluate for potential congestive heart failure.  PORTABLE CHEST - 1 VIEW  Comparison: Chest x-ray 01/16/2013.  Findings: Compared to the prior examination in multifocal interstitial and airspace disease has slightly improved compared to the prior study.  There continues to be some cephalization of the pulmonary vasculature.  Potential trace bilateral pleural effusions.  Heart size is mildly enlarged.  Upper mediastinal contours are within normal limits.  Atherosclerosis in the thoracic aorta.  Status post median sternotomy for CABG, as well as aortic and mitral valve replacement.  IMPRESSION: 1.   Improving aeration throughout the lungs bilaterally.  This is favored to represent resolving pulmonary edema, however, some component of superimposed infection is not excluded. 2.  Mild cardiomegaly is unchanged. 3.  Atherosclerosis. 4.  Postoperative changes, as above.   Original Report Authenticated By: Trudie Reed, M.D.   Dg Chest Port 1 View  01/16/2013   *RADIOLOGY REPORT*  Clinical Data: Shortness of breath.  PORTABLE CHEST - 1 VIEW  Comparison: Chest radiograph from 05/25/2011  Findings: The lungs are well expanded.  Bilateral airspace opacification is noted, particularly at the lower lung zones.  This may reflect pneumonia or pulmonary edema.  Small bilateral pleural effusions are seen.  No pneumothorax is seen.  Underlying vascular congestion is noted.  The cardiomediastinal silhouette is borderline normal in size.  The patient is status post median sternotomy.  A mitral valve replacement is noted.  Calcification is noted in the aortic arch. No acute osseous abnormalities are seen.  IMPRESSION: Bilateral airspace opacification, particularly at the lower lung zones.  This may reflect pneumonia or pulmonary edema.  Underlying vascular congestion noted.  Small bilateral pleural effusions seen.   Original Report Authenticated By: Tonia Ghent, M.D.    ASSESSMENT AND PLAN  Active Problems:   HYPERLIPIDEMIA   ANEMIA   CHRONIC OBSTRUCTIVE PULMONARY DISEASE   MITRAL VALVE REPLACEMENT, HX OF   AORTIC VALVE REPLACEMENT, HX OF   Chronic anticoagulation   Arteriosclerotic cardiovascular disease (ASCVD)   Hypertension   GERD (gastroesophageal reflux disease)   Atrial fibrillation with RVR   NSTEMI (non-ST elevated myocardial infarction)   Non-sustained ventricular tachycardia   Acute respiratory failure with hypoxia    Ready for discharge. We'll have him return to the [place] office for a pro time as well as a CBC on Tuesday. See Ms Lyman Bishop end of the week.  Signed, Valera Castle MD

## 2013-01-25 ENCOUNTER — Other Ambulatory Visit: Payer: Self-pay | Admitting: *Deleted

## 2013-01-25 ENCOUNTER — Ambulatory Visit (INDEPENDENT_AMBULATORY_CARE_PROVIDER_SITE_OTHER): Payer: Medicare Other | Admitting: *Deleted

## 2013-01-25 DIAGNOSIS — Z9889 Other specified postprocedural states: Secondary | ICD-10-CM

## 2013-01-25 DIAGNOSIS — Z7901 Long term (current) use of anticoagulants: Secondary | ICD-10-CM

## 2013-01-25 DIAGNOSIS — D649 Anemia, unspecified: Secondary | ICD-10-CM

## 2013-01-25 DIAGNOSIS — Z954 Presence of other heart-valve replacement: Secondary | ICD-10-CM

## 2013-01-25 LAB — POCT INR: INR: 3.6

## 2013-01-26 ENCOUNTER — Encounter: Payer: Medicare Other | Admitting: Cardiology

## 2013-01-26 ENCOUNTER — Ambulatory Visit (INDEPENDENT_AMBULATORY_CARE_PROVIDER_SITE_OTHER): Payer: Medicare Other | Admitting: Cardiology

## 2013-01-26 ENCOUNTER — Encounter: Payer: Self-pay | Admitting: Cardiology

## 2013-01-26 VITALS — BP 121/61 | HR 80 | Ht 66.5 in | Wt 160.5 lb

## 2013-01-26 DIAGNOSIS — I2510 Atherosclerotic heart disease of native coronary artery without angina pectoris: Secondary | ICD-10-CM

## 2013-01-26 DIAGNOSIS — Z954 Presence of other heart-valve replacement: Secondary | ICD-10-CM

## 2013-01-26 DIAGNOSIS — I7090 Unspecified atherosclerosis: Secondary | ICD-10-CM

## 2013-01-26 DIAGNOSIS — Z7901 Long term (current) use of anticoagulants: Secondary | ICD-10-CM

## 2013-01-26 DIAGNOSIS — Z9889 Other specified postprocedural states: Secondary | ICD-10-CM

## 2013-01-26 DIAGNOSIS — I4891 Unspecified atrial fibrillation: Secondary | ICD-10-CM

## 2013-01-26 LAB — CBC
Hemoglobin: 8 g/dL — ABNORMAL LOW (ref 13.0–17.0)
MCHC: 33.1 g/dL (ref 30.0–36.0)
RBC: 2.93 MIL/uL — ABNORMAL LOW (ref 4.22–5.81)
WBC: 5.9 10*3/uL (ref 4.0–10.5)

## 2013-01-26 NOTE — Assessment & Plan Note (Signed)
Ventricular rate well controlled. No change in medical therapy.

## 2013-01-26 NOTE — Progress Notes (Signed)
[name] comes in today for close followup after discharge from the hospital 4 days ago. He did have a followup INR yesterday which was therapeutic for his 2 mechanical valves. Since discharge his cough is improved he is no longer producing any sputum. He's had no further angina or tachycardia palpitations.  Had a drug-eluting stent placed to his right coronary artery. He is being treated with Plavix and Coumadin and no aspirin.  His exam today is stable. I'll have him followup with [place] for his protimes as well as see and establish with Dr. Neill Loft in 8 weeks.

## 2013-01-26 NOTE — Patient Instructions (Addendum)
Your physician recommends that you schedule a follow-up appointment in: 8 WEEKS WITH Dr. Purvis Sheffield

## 2013-01-26 NOTE — Assessment & Plan Note (Signed)
Stable after drug-eluting stent to the right coronary artery. No change in medical therapy.

## 2013-01-27 ENCOUNTER — Emergency Department (HOSPITAL_COMMUNITY): Payer: Medicare Other

## 2013-01-27 ENCOUNTER — Inpatient Hospital Stay (HOSPITAL_COMMUNITY)
Admission: EM | Admit: 2013-01-27 | Discharge: 2013-01-31 | DRG: 292 | Disposition: A | Discharge status: Home / Self Care | Payer: Medicare Other | Attending: Cardiology | Admitting: Cardiology

## 2013-01-27 ENCOUNTER — Encounter (HOSPITAL_COMMUNITY): Payer: Self-pay | Admitting: Emergency Medicine

## 2013-01-27 DIAGNOSIS — F1010 Alcohol abuse, uncomplicated: Secondary | ICD-10-CM | POA: Diagnosis present

## 2013-01-27 DIAGNOSIS — I4891 Unspecified atrial fibrillation: Secondary | ICD-10-CM | POA: Diagnosis present

## 2013-01-27 DIAGNOSIS — I5033 Acute on chronic diastolic (congestive) heart failure: Principal | ICD-10-CM | POA: Diagnosis present

## 2013-01-27 DIAGNOSIS — J4489 Other specified chronic obstructive pulmonary disease: Secondary | ICD-10-CM | POA: Diagnosis present

## 2013-01-27 DIAGNOSIS — K219 Gastro-esophageal reflux disease without esophagitis: Secondary | ICD-10-CM | POA: Diagnosis present

## 2013-01-27 DIAGNOSIS — I472 Ventricular tachycardia: Secondary | ICD-10-CM

## 2013-01-27 DIAGNOSIS — I2510 Atherosclerotic heart disease of native coronary artery without angina pectoris: Secondary | ICD-10-CM | POA: Diagnosis present

## 2013-01-27 DIAGNOSIS — I214 Non-ST elevation (NSTEMI) myocardial infarction: Secondary | ICD-10-CM | POA: Diagnosis present

## 2013-01-27 DIAGNOSIS — I471 Supraventricular tachycardia, unspecified: Secondary | ICD-10-CM | POA: Diagnosis present

## 2013-01-27 DIAGNOSIS — Z954 Presence of other heart-valve replacement: Secondary | ICD-10-CM

## 2013-01-27 DIAGNOSIS — R7309 Other abnormal glucose: Secondary | ICD-10-CM | POA: Diagnosis present

## 2013-01-27 DIAGNOSIS — I4729 Other ventricular tachycardia: Secondary | ICD-10-CM

## 2013-01-27 DIAGNOSIS — Z9889 Other specified postprocedural states: Secondary | ICD-10-CM

## 2013-01-27 DIAGNOSIS — Z7901 Long term (current) use of anticoagulants: Secondary | ICD-10-CM

## 2013-01-27 DIAGNOSIS — Z9861 Coronary angioplasty status: Secondary | ICD-10-CM

## 2013-01-27 DIAGNOSIS — R7301 Impaired fasting glucose: Secondary | ICD-10-CM | POA: Diagnosis present

## 2013-01-27 DIAGNOSIS — E876 Hypokalemia: Secondary | ICD-10-CM | POA: Diagnosis not present

## 2013-01-27 DIAGNOSIS — N189 Chronic kidney disease, unspecified: Secondary | ICD-10-CM | POA: Diagnosis present

## 2013-01-27 DIAGNOSIS — I10 Essential (primary) hypertension: Secondary | ICD-10-CM | POA: Diagnosis present

## 2013-01-27 DIAGNOSIS — J449 Chronic obstructive pulmonary disease, unspecified: Secondary | ICD-10-CM | POA: Diagnosis present

## 2013-01-27 DIAGNOSIS — D649 Anemia, unspecified: Secondary | ICD-10-CM | POA: Diagnosis present

## 2013-01-27 DIAGNOSIS — F17200 Nicotine dependence, unspecified, uncomplicated: Secondary | ICD-10-CM | POA: Diagnosis present

## 2013-01-27 DIAGNOSIS — E785 Hyperlipidemia, unspecified: Secondary | ICD-10-CM | POA: Diagnosis present

## 2013-01-27 DIAGNOSIS — Z951 Presence of aortocoronary bypass graft: Secondary | ICD-10-CM

## 2013-01-27 DIAGNOSIS — E871 Hypo-osmolality and hyponatremia: Secondary | ICD-10-CM | POA: Diagnosis present

## 2013-01-27 DIAGNOSIS — I509 Heart failure, unspecified: Secondary | ICD-10-CM | POA: Diagnosis present

## 2013-01-27 DIAGNOSIS — I129 Hypertensive chronic kidney disease with stage 1 through stage 4 chronic kidney disease, or unspecified chronic kidney disease: Secondary | ICD-10-CM | POA: Diagnosis present

## 2013-01-27 DIAGNOSIS — Z952 Presence of prosthetic heart valve: Secondary | ICD-10-CM

## 2013-01-27 DIAGNOSIS — I5021 Acute systolic (congestive) heart failure: Secondary | ICD-10-CM

## 2013-01-27 HISTORY — DX: Chronic diastolic (congestive) heart failure: I50.32

## 2013-01-27 LAB — URINALYSIS, ROUTINE W REFLEX MICROSCOPIC
Bilirubin Urine: NEGATIVE
Glucose, UA: NEGATIVE mg/dL
Ketones, ur: NEGATIVE mg/dL
Leukocytes, UA: NEGATIVE
Nitrite: NEGATIVE
Specific Gravity, Urine: 1.025 (ref 1.005–1.030)
pH: 5.5 (ref 5.0–8.0)
pH: 6 (ref 5.0–8.0)

## 2013-01-27 LAB — BASIC METABOLIC PANEL
CO2: 25 mEq/L (ref 19–32)
Chloride: 88 mEq/L — ABNORMAL LOW (ref 96–112)
Creatinine, Ser: 1.22 mg/dL (ref 0.50–1.35)
Glucose, Bld: 233 mg/dL — ABNORMAL HIGH (ref 70–99)

## 2013-01-27 LAB — URINE MICROSCOPIC-ADD ON

## 2013-01-27 LAB — CBC WITH DIFFERENTIAL/PLATELET
Basophils Absolute: 0 10*3/uL (ref 0.0–0.1)
HCT: 28.6 % — ABNORMAL LOW (ref 39.0–52.0)
Hemoglobin: 9.5 g/dL — ABNORMAL LOW (ref 13.0–17.0)
Lymphocytes Relative: 4 % — ABNORMAL LOW (ref 12–46)
Lymphs Abs: 0.3 10*3/uL — ABNORMAL LOW (ref 0.7–4.0)
Monocytes Absolute: 0.4 10*3/uL (ref 0.1–1.0)
Neutro Abs: 7.4 10*3/uL (ref 1.7–7.7)
RBC: 3.31 MIL/uL — ABNORMAL LOW (ref 4.22–5.81)
RDW: 16.9 % — ABNORMAL HIGH (ref 11.5–15.5)
WBC: 8.2 10*3/uL (ref 4.0–10.5)

## 2013-01-27 LAB — GLUCOSE, CAPILLARY: Glucose-Capillary: 70 mg/dL (ref 70–99)

## 2013-01-27 LAB — HEPATIC FUNCTION PANEL
Albumin: 3.4 g/dL — ABNORMAL LOW (ref 3.5–5.2)
Alkaline Phosphatase: 107 U/L (ref 39–117)
Total Protein: 7.6 g/dL (ref 6.0–8.3)

## 2013-01-27 LAB — HEMOGLOBIN A1C: Hgb A1c MFr Bld: 5.1 % (ref <5.7)

## 2013-01-27 LAB — PRO B NATRIURETIC PEPTIDE: Pro B Natriuretic peptide (BNP): 5719 pg/mL — ABNORMAL HIGH (ref 0–125)

## 2013-01-27 LAB — TROPONIN I: Troponin I: 0.45 ng/mL (ref <0.30)

## 2013-01-27 LAB — PROTIME-INR: Prothrombin Time: 29.3 seconds — ABNORMAL HIGH (ref 11.6–15.2)

## 2013-01-27 MED ORDER — FERROUS SULFATE 325 (65 FE) MG PO TBEC: 325.0000 mg | DELAYED_RELEASE_TABLET | Freq: Every day | ORAL | Status: DC

## 2013-01-27 MED ORDER — FUROSEMIDE 10 MG/ML IJ SOLN
INTRAMUSCULAR | Status: AC
  Administered 2013-01-27: 40 mg via INTRAVENOUS
  Filled 2013-01-27: qty 4

## 2013-01-27 MED ORDER — FOLIC ACID 1 MG PO TABS
1.0000 mg | ORAL_TABLET | Freq: Every day | ORAL | Status: DC
  Administered 2013-01-27 – 2013-01-31 (×5): 1 mg via ORAL
  Filled 2013-01-27 (×5): qty 1

## 2013-01-27 MED ORDER — ACETAMINOPHEN 325 MG PO TABS: 650.0000 mg | ORAL_TABLET | Freq: Four times a day (QID) | ORAL | Status: DC | PRN

## 2013-01-27 MED ORDER — LISINOPRIL 20 MG PO TABS
20.0000 mg | ORAL_TABLET | Freq: Every day | ORAL | Status: DC
  Administered 2013-01-27 – 2013-01-31 (×5): 20 mg via ORAL
  Filled 2013-01-27 (×5): qty 1

## 2013-01-27 MED ORDER — FUROSEMIDE 10 MG/ML IJ SOLN
80.0000 mg | Freq: Once | INTRAMUSCULAR | Status: AC
  Administered 2013-01-27: 80 mg via INTRAVENOUS
  Filled 2013-01-27: qty 8

## 2013-01-27 MED ORDER — PANTOPRAZOLE SODIUM 40 MG PO TBEC
40.0000 mg | DELAYED_RELEASE_TABLET | Freq: Every day | ORAL | Status: DC
  Administered 2013-01-27 – 2013-01-31 (×5): 40 mg via ORAL
  Filled 2013-01-27 (×5): qty 1

## 2013-01-27 MED ORDER — WARFARIN SODIUM 7.5 MG PO TABS
7.5000 mg | ORAL_TABLET | Freq: Every day | ORAL | Status: DC
  Administered 2013-01-27 – 2013-01-28 (×2): 7.5 mg via ORAL
  Filled 2013-01-27 (×3): qty 1

## 2013-01-27 MED ORDER — FUROSEMIDE 10 MG/ML IJ SOLN
80.0000 mg | Freq: Two times a day (BID) | INTRAMUSCULAR | Status: DC
  Administered 2013-01-27: 80 mg via INTRAVENOUS
  Filled 2013-01-27 (×3): qty 8

## 2013-01-27 MED ORDER — NITROGLYCERIN 2 % TD OINT: 1.0000 [in_us] | TOPICAL_OINTMENT | Freq: Four times a day (QID) | TRANSDERMAL | Status: DC

## 2013-01-27 MED ORDER — FERROUS SULFATE 325 (65 FE) MG PO TABS
325.0000 mg | ORAL_TABLET | Freq: Every day | ORAL | Status: DC
  Administered 2013-01-28 – 2013-01-31 (×4): 325 mg via ORAL
  Filled 2013-01-27 (×6): qty 1

## 2013-01-27 MED ORDER — FUROSEMIDE 10 MG/ML IJ SOLN: 40.0000 mg | Freq: Once | INTRAMUSCULAR | Status: AC

## 2013-01-27 MED ORDER — INSULIN ASPART 100 UNIT/ML ~~LOC~~ SOLN
0.0000 [IU] | Freq: Three times a day (TID) | SUBCUTANEOUS | Status: DC
Start: 2013-01-27 — End: 2013-01-30
  Administered 2013-01-27 – 2013-01-29 (×2): 3 [IU] via SUBCUTANEOUS
  Administered 2013-01-29: 2 [IU] via SUBCUTANEOUS

## 2013-01-27 MED ORDER — VITAMIN B-1 100 MG PO TABS
100.0000 mg | ORAL_TABLET | Freq: Every day | ORAL | Status: DC
  Administered 2013-01-27 – 2013-01-31 (×5): 100 mg via ORAL
  Filled 2013-01-27 (×5): qty 1

## 2013-01-27 MED ORDER — HYDROCODONE-ACETAMINOPHEN 5-325 MG PO TABS: 1.0000 | ORAL_TABLET | Freq: Four times a day (QID) | ORAL | Status: DC | PRN

## 2013-01-27 MED ORDER — ALBUTEROL (5 MG/ML) CONTINUOUS INHALATION SOLN
10.0000 mg | INHALATION_SOLUTION | RESPIRATORY_TRACT | Status: DC
  Administered 2013-01-27: 10 mg via RESPIRATORY_TRACT
  Filled 2013-01-27: qty 20

## 2013-01-27 MED ORDER — AMLODIPINE BESYLATE 10 MG PO TABS
10.0000 mg | ORAL_TABLET | Freq: Every day | ORAL | Status: DC
  Administered 2013-01-27 – 2013-01-31 (×5): 10 mg via ORAL
  Filled 2013-01-27 (×5): qty 1

## 2013-01-27 MED ORDER — METOPROLOL TARTRATE 100 MG PO TABS
100.0000 mg | ORAL_TABLET | Freq: Two times a day (BID) | ORAL | Status: DC
  Administered 2013-01-27 – 2013-01-31 (×9): 100 mg via ORAL
  Filled 2013-01-27 (×10): qty 1

## 2013-01-27 MED ORDER — SODIUM CHLORIDE 0.9 % IJ SOLN: 3.0000 mL | INTRAMUSCULAR | Status: DC | PRN

## 2013-01-27 MED ORDER — NITROGLYCERIN 0.4 MG SL SUBL: 0.4000 mg | SUBLINGUAL_TABLET | SUBLINGUAL | Status: DC | PRN

## 2013-01-27 MED ORDER — WARFARIN - PHARMACIST DOSING INPATIENT: Freq: Every day | Status: DC

## 2013-01-27 MED ORDER — CLOPIDOGREL BISULFATE 75 MG PO TABS
75.0000 mg | ORAL_TABLET | Freq: Every day | ORAL | Status: DC
  Administered 2013-01-28 – 2013-01-31 (×4): 75 mg via ORAL
  Filled 2013-01-27 (×5): qty 1

## 2013-01-27 MED ORDER — SODIUM CHLORIDE 0.9 % IV SOLN: 250.0000 mL | INTRAVENOUS | Status: DC | PRN

## 2013-01-27 MED ORDER — NITROGLYCERIN 2 % TD OINT
1.0000 [in_us] | TOPICAL_OINTMENT | Freq: Four times a day (QID) | TRANSDERMAL | Status: DC
  Administered 2013-01-27 – 2013-01-31 (×17): 1 [in_us] via TOPICAL
  Filled 2013-01-27: qty 1

## 2013-01-27 MED ORDER — ALBUTEROL SULFATE (5 MG/ML) 0.5% IN NEBU
2.5000 mg | INHALATION_SOLUTION | Freq: Four times a day (QID) | RESPIRATORY_TRACT | Status: DC
  Administered 2013-01-27 – 2013-01-29 (×10): 2.5 mg via RESPIRATORY_TRACT
  Filled 2013-01-27 (×11): qty 0.5

## 2013-01-27 MED ORDER — ONDANSETRON HCL 4 MG/2ML IJ SOLN: 4.0000 mg | Freq: Four times a day (QID) | INTRAMUSCULAR | Status: DC | PRN

## 2013-01-27 MED ORDER — FUROSEMIDE 10 MG/ML IJ SOLN
40.0000 mg | INTRAMUSCULAR | Status: AC
  Administered 2013-01-27: 40 mg via INTRAVENOUS
  Filled 2013-01-27: qty 4

## 2013-01-27 MED ORDER — SODIUM CHLORIDE 0.9 % IJ SOLN
3.0000 mL | Freq: Two times a day (BID) | INTRAMUSCULAR | Status: DC
  Administered 2013-01-27 – 2013-01-31 (×8): 3 mL via INTRAVENOUS

## 2013-01-27 MED ORDER — SODIUM CHLORIDE 0.9 % IV SOLN
Freq: Once | INTRAVENOUS | Status: AC
  Administered 2013-01-27: 08:00:00 via INTRAVENOUS

## 2013-01-27 MED ORDER — ATORVASTATIN CALCIUM 40 MG PO TABS
40.0000 mg | ORAL_TABLET | Freq: Every day | ORAL | Status: DC
  Administered 2013-01-27 – 2013-01-31 (×5): 40 mg via ORAL
  Filled 2013-01-27 (×5): qty 1

## 2013-01-27 MED ORDER — ADULT MULTIVITAMIN W/MINERALS CH
1.0000 | ORAL_TABLET | Freq: Every day | ORAL | Status: DC
  Administered 2013-01-27 – 2013-01-31 (×5): 1 via ORAL
  Filled 2013-01-27 (×5): qty 1

## 2013-01-27 MED ORDER — ACETAMINOPHEN 325 MG PO TABS: 650.0000 mg | ORAL_TABLET | ORAL | Status: DC | PRN

## 2013-01-27 NOTE — Progress Notes (Signed)
Pt brought in by EMS on CPAP, pt continues to have increased work of breathing.  Pt placed on BIPAP 14/6 100%.  RT will continue to monitor.

## 2013-01-27 NOTE — ED Notes (Signed)
[facility]  Paged via [facility].

## 2013-01-27 NOTE — ED Provider Notes (Signed)
Date: 01/27/2013    1610  Rate: 84  Rhythm: normal sinus rhythm  QRS Axis: normal  Intervals: normal  ST/T Wave abnormalities: ST elevations diffusely and ST depressions anteriorly  Conduction Disutrbances:none  Narrative Interpretation:   Old EKG Reviewed: changes noted c/w 01/18/13 increased gain, deeper ST depression in lateral leads, increased ST elevation in lead III   Nicoletta Dress. Colon Branch, MD 01/27/13 [phone]

## 2013-01-27 NOTE — H&P (Signed)
Patient ID: [name] MRN: [id number], DOB/AGE: [birth year]   Admit date: 01/27/2013  Primary Physician: Fredirick Maudlin, MD Primary Cardiologist: R. Dietrich Pates, MD   Pt. Profile:  67 year old male with history of CAD status post CABG as well as mechanical mitral and aortic valve replacements who was recently discharged following non-STEMI and stenting of the native right coronary artery who presents with acute on chronic diastolic CHF.  Problem List  Past Medical History  Diagnosis Date  . Hypertension   . CAD (coronary artery disease) 2003    a. CABG/MVR/AVR-2003 (#25 St. Jude/#21 St. Jude); anticoagulation; b. negative stress nuclear-2005;  c. 01/2013 NSTEMI/Cath/PCI: LM nl, LAD 50p, 40-34m, D1 50ost, LCX 74m, RCA 50/36m (2.5x20 Promus DES), 50d, VG->Diag 100, VG->PDA 100, VG->OM 100, LIMA->LAD ok.  . Epistaxis     requiring cautery & aterial ligation-09/2009  . GERD (gastroesophageal reflux disease)   . Hyperlipemia   . Abnormal LFTs     possible cirrhosis  . Chronic anticoagulation   . Valvular heart disease     a. 2003: MVR/AVR-2003 (#25 St. Jude/#21 St. Jude);  b. 01/2013 Echo: EF 55%, Mech AVR mean grad 12, Mech MVR mean grad 6.  . Tobacco abuse     45 pack years  . Fasting hyperglycemia   . Nephrolithiasis   . Diverticulosis   . Hyponatremia   . Chronic diastolic CHF (congestive heart failure)   . Chronic renal disease   . Hemolytic anemia     history of  . ETOH abuse   . A-fib 01/16/2013    a. on coumadin  . COPD (chronic obstructive pulmonary disease)     CONTROLED    Past Surgical History  Procedure Laterality Date  . Endocopic sphenopalatine artery ligation & cautry    . Inguinal hernia repair      Left & right  . Coronary artery bypass graft  11/2001    [facility]  . Cardiac valve replacement  11/2001    AVR and MVR-St. Jude devices  . Cataract extraction w/phaco  01/20/2011    Procedure: CATARACT EXTRACTION PHACO AND INTRAOCULAR LENS  PLACEMENT (IOC);  Surgeon: Loraine Leriche T. Nile Riggs;  Location: [facility] ORS;  Service: Ophthalmology;  Laterality: Right;  CDE: 8.51  . Cataract extraction w/phaco  02/03/2011    Procedure: CATARACT EXTRACTION PHACO AND INTRAOCULAR LENS PLACEMENT (IOC);  Surgeon: Loraine Leriche T. Nile Riggs;  Location: [facility];  Service: Ophthalmology;  Laterality: Left;  CDE:10.01  . Colonoscopy  04/05/02; 08/2011    friable anal canal hemorrhoids otherwise normal; 2 diminutive polyps excised, minimal diverticulosis noted  . Esophagogastroduodenoscopy  01/2002    Dr. Karilyn Cota, submucosal esophageal lesion c/w leiomyoma  . Colonoscopy  09/02/2011    Procedure: COLONOSCOPY;  Surgeon: Corbin Ade, MD;  Location: [facility] SUITE;  Service: Endoscopy;  Laterality: N/A;  8:15     Allergies  No Known Allergies  HPI  67 year old male with prior history CAD status post coronary artery bypass grafting as well as mechanical mitral valve and aortic valve placement on chronic Coumadin anticoagulation. Patient was recently admitted just over 10 days ago with complaints of chest pain and dyspnea in the setting of A. fib and RVR. Patient ruled in for non-ST segment elevation myocardial infarction and after holding of his Coumadin and reduction of INR, he underwent diagnostic catheterization revealing severe native right coronary artery disease with only one of 4 patent grafts. Occlusion of the vein graft to the diagonal was felt to be the infarct  vessel. The native right coronary artery was successfully stented with a drug-eluting stent. Patient did have volume overload post PCI and required diuresis. Subsequently placed back on his Coumadin and discharged once his INR was therapeutic.  He was counseled on the importance of tobacco and alcohol cessation. Following discharge, he says that he's been doing well. He has not had any chest pain or dyspnea. He has not weighed himself at home. He eats all of his meals at a [employer] and does not watch sodium  intake. This morning, he woke approximately 2 AM acutely dyspneic and orthopneic without chest pain. He called EMS and was taken to the [facility]. There, he was placed on BiPAP and treated with nebulizers and subsequently a total of a 160 mg of IV Lasix.  His proBNP was elevated at 5719 and chest x-ray showed pulmonary edema. Following some diuresis, he was able to be weaned from BiPAP to a nonrebreather and he was [facility]. He reports that he has not had any chest pain. He is diuresis possibly 1200 mL's already. His breathing has improved and he will be transitioned to nasal cannula shortly. His weight is up approximately 10 pounds since discharge.  Home Medications  Prior to Admission medications   Medication Sig Start Date End Date Taking? Authorizing Provider  acetaminophen (TYLENOL) 325 MG tablet Take 2 tablets (650 mg total) by mouth every 6 (six) hours as needed. For pain 01/22/13  Yes Brooke O Edmisten, PA-C  amLODipine (NORVASC) 10 MG tablet Take 1 tablet (10 mg total) by mouth daily. 01/22/13  Yes Brooke O Edmisten, PA-C  atorvastatin (LIPITOR) 40 MG tablet Take 40 mg by mouth daily.   Yes Historical Provider, MD  clopidogrel (PLAVIX) 75 MG tablet Take 1 tablet (75 mg total) by mouth daily with breakfast. 01/22/13  Yes Brooke O Edmisten, PA-C  ferrous sulfate 325 (65 FE) MG EC tablet Take 325 mg by mouth daily with breakfast.   Yes Historical Provider, MD  folic acid (FOLVITE) 1 MG tablet Take 1 tablet (1 mg total) by mouth daily. 01/22/13  Yes Brooke O Edmisten, PA-C  furosemide (LASIX) 40 MG tablet Take 1 tablet (40 mg total) by mouth daily. 01/22/13 09/15/14 Yes Brooke O Edmisten, PA-C  HYDROcodone-acetaminophen (LORTAB) 10-500 MG per tablet Take 1 tablet by mouth 4 times daily as needed for pain.  03/07/12  Yes Historical Provider, MD  levalbuterol Pauline Aus) 0.63 MG/3ML nebulizer solution Take 3 mLs (0.63 mg total) by nebulization every 6 (six) hours as needed  for wheezing or shortness of breath. 01/22/13  Yes Brooke O Edmisten, PA-C  lisinopril (PRINIVIL,ZESTRIL) 5 MG tablet Take 4 tablets (20 mg total) by mouth daily. 01/22/13  Yes Brooke O Edmisten, PA-C  metoprolol (LOPRESSOR) 50 MG tablet Take 2 tablets (100 mg total) by mouth 2 (two) times daily. 01/22/13  Yes Brooke O Edmisten, PA-C  Multiple Vitamin (MULTIVITAMIN WITH MINERALS) TABS Take 1 tablet by mouth daily. 01/22/13  Yes Brooke O Edmisten, PA-C  omeprazole (PRILOSEC) 20 MG capsule Take 40 mg by mouth daily.    Yes Historical Provider, MD  thiamine 100 MG tablet Take 1 tablet (100 mg total) by mouth daily. 01/22/13  Yes Brooke O Edmisten, PA-C  warfarin (COUMADIN) 5 MG tablet Take 1.5 tablets (7.5 mg total) by mouth daily. Return to [facility] for INR on Wed, 01/25/2013 at 9:20 AM. 01/22/13  Yes Minda Meo, PA-C   Family History  Family  History  Problem Relation Age of Onset  . Hypotension Neg Hx   . Anesthesia problems Neg Hx   . Malignant hyperthermia Neg Hx   . Pseudochol deficiency Neg Hx   . Colon cancer Neg Hx   . Liver disease Neg Hx    Social History  History   Social History  . Marital Status: Widowed    Spouse Name: N/A    Number of Children: 1  . Years of Education: N/A   Occupational History  . retired    Social History Main Topics  . Smoking status: Current Every Day Smoker -- 1.00 packs/day for 52 years    Types: Cigarettes  . Smokeless tobacco: Never Used     Comment: STATES HE IS CUTTING DOWN ON  SMOKING HIMSELF   . Alcohol Use: 4.2 oz/week    6 Cans of beer, 1 Shots of liquor per week     Comment: DAILY  . Drug Use: No  . Sexually Active: No   Other Topics Concern  . Not on file   Social History Narrative  . No narrative on file    Review of Systems General:  No chills, fever, night sweats or weight changes.  Cardiovascular:  No chest pain, +++dyspnea and orthopnea this AM.  No edema, palpitations, paroxysmal nocturnal  dyspnea. Dermatological: No rash, lesions/masses Respiratory: No cough, dyspnea Urologic: No hematuria, dysuria Abdominal:   No nausea, vomiting, diarrhea, bright red blood per rectum, melena, or hematemesis Neurologic:  No visual changes, wkns, changes in mental status. All other systems reviewed and are otherwise negative except as noted above.  Physical Exam  Blood pressure 117/79, pulse 80, temperature 97.7 F (36.5 C), temperature source Oral, resp. rate 20, height 5' 6.5" (1.689 m), weight 158 lb 15.2 oz (72.1 kg), SpO2 98.00%.  General: Pleasant, NAD Psych: Normal affect. Neuro: Alert and oriented X 3. Moves all extremities spontaneously. HEENT: Normal  Neck: Supple without bruits.  JVP to jaw. Lungs:  Resp regular and unlabored, diminished breath sounds bilat. Heart: RRR with mech S1/S2 clicks.  No s3, s4, or murmurs. Abdomen: Semi-firm, non-tender, non-distended, BS + x 4.  Extremities: No clubbing, cyanosis.  Trace bilat LE edema. DP/PT/Radials 2+ and equal bilaterally.  Labs ProBNP 5719  Recent Labs  01/27/13 0703  TROPONINI 0.45*   Lab Results  Component Value Date   WBC 8.2 01/27/2013   HGB 9.5* 01/27/2013   HCT 28.6* 01/27/2013   MCV 86.4 01/27/2013   PLT 351 01/27/2013    Recent Labs Lab 01/27/13 0703 01/27/13 0705  NA 123*  --   K 4.9  --   CL 88*  --   CO2 25  --   BUN 28*  --   CREATININE 1.22  --   CALCIUM 8.4  --   PROT  --  7.6  BILITOT  --  0.3  ALKPHOS  --  107  ALT  --  20  AST  --  36  GLUCOSE 233*  --    Lab Results  Component Value Date   CHOL 171 05/18/2011   HDL 55 05/18/2011   LDLCALC 106* 05/18/2011   TRIG 49 05/18/2011   Radiology/Studies  Dg Chest Port 1 View  01/27/2013   *RADIOLOGY REPORT*  Clinical Data: Short of breath.  PORTABLE CHEST - 1 VIEW  Comparison: 01/18/2013.  Findings: Median sternotomy with cardiac valve replacement. There is diffuse bilateral left greater than right basilar predominant airspace disease most  compatible with cardiogenic  pulmonary edema. The heart is enlarged.  Superimposed pneumonia is difficult to exclude but not favored. Monitoring leads are projected over the chest.  Mediastinal contours appear unchanged with aortic arch atherosclerosis.  IMPRESSION: Moderate CHF with left greater than right pulmonary edema.  The airspace disease is most compatible with edema however superimposed infection cannot be excluded radiographically.   Original Report Authenticated By: Andreas Newport, M.D.   ECG  RSR, 84, .5-1.5 inf ST elevation, with lateral ST dep and T changes.  Similar to previous ECG on 7/16, though slightly more pronounced.  ASSESSMENT AND PLAN  1. Acute on chronic diastolic congestive heart failure: Patient presents with acute dyspnea and orthopnea starting this morning. Chest x-ray showed pulmonary edema and BNP is elevated greater than 5000. His abnormal ECG however this is unchanged from his last ECG. He has had no chest pain. His troponin is mildly elevated although may be coming down from his last admission. His been treated with 160 mg IV Lasix thus far as our diuresis approximately 1.2 L. His breathing has improved with diuresis. Heart rate and blood pressure are currently stable. We'll plan to admit and aggressively diuresis. He is 10 pounds above his previous discharge weight. He has significant dietary indiscretion, eating out at restaurants for every meal. We had a discussion of is likely contributing to his current situation and he understands this and is going to change his behavior.  2. Coronary artery disease: Patient status post stenting of the native right coronary artery. He has one of 4 patent grafts (LIMA to the LAD). His ECG is markedly abnl though with slightly more pronounced ST/T changes than previously.  Repeat is more like previous baseline ecg. His troponin is mildly elevated however this is likely a reflection of his last non-STEMI though we will certainly continue  to cycle CE and if his troponins elevate further, we'll likely hold Coumadin and plan a repeat diagnostic catheterization to evaluate his right coronary artery stent. If however troponins continue trend down or remain flat, we will plan to continue medical therapy. He is not on aspirin secondary to concomitant Plavix and Coumadin therapy.  Cont statin, bb, acei.  3.  S/P AVR/MVR:  Continue coumadin for now with plan as outlined as above.  4.  HTN:  Stable.  5.  HL:  F/u.  Last checked in 2012.  LFT's nl.  6.  Tob/ETOH Abuse:  Cessation advised.  He is still smoking 1/2 ppd.  7.  Hyponatremia:  Likely reflective of CHF.  8.  Anemia:  Normocytic, normochromic.  Stable.  9.  Hyperglycemia:  Gluc 233 this AM.  He has been NPO up to this point.  Check A1c and add SSI.  10.  Abnl UA:  Asymptomatic.  Afebrile.  Nl WBC.  Repeat and culture.  Signed, Nicolasa Ducking, NP 01/27/2013, 12:34 PM   History and all data above reviewed.  Patient examined.  I agree with the findings as above.  The patient presented with acute on chronic diastolic HF.  His initial EKG looked like possible acute inferior injury.  He however, was not having any chest pain.  He has improved with diuresis and his EKG done minutes ago no longer has the acute inferior ST elevation and his ST depression in the lateral leads is less pronounced.  The patient exam reveals COR:RRR  ,  Lungs: Bilateral basilar crackles  ,  Abd: Positive bowel sounds, no rebound no guarding, Ext No edema  .  All available labs,  radiology testing, previous records reviewed. Agree with documented assessment and plan. CHF:  Plan IV diuresis.  I suspect that the most important intervention will be education about salt as he eats excessive salt and has gained 10 pounds since discharge couple of weeks ago. We will treat with IV diuretics and otherwise continue meds as listed.  CAD:  He did have recent stenting of his right coronary artery. However, he has no  symptoms and initial enzymes are negative. For now we will manage this conservatively.  Fayrene Fearing Molly Savarino  2:02 PM  01/27/2013

## 2013-01-27 NOTE — Care Management Note (Signed)
    Page 1 of 1   01/27/2013     12:30:11 PM   CARE MANAGEMENT NOTE 01/27/2013  Patient:  [name], [name]   Account Number:  [id number]  Date Initiated:  01/27/2013  Documentation initiated by:  Junius Creamer  Subjective/Objective Assessment:   adm w heart failure     Action/Plan:   lives alone, pcp dr ed Juanetta Gosling   Anticipated DC Date:     Anticipated DC Plan:  HOME W HOME HEALTH SERVICES      DC Planning Services  CM consult      Choice offered to / List presented to:             Status of service:   Medicare Important Message given?   (If response is "NO", the following Medicare IM given date fields will be blank) Date Medicare IM given:   Date Additional Medicare IM given:    Discharge Disposition:    Per UR Regulation:  Reviewed for med. necessity/level of care/duration of stay  If discussed at Long Length of Stay Meetings, dates discussed:    Comments:  7/25 1229p [name] rn,bsn pt in w heart failure will moniter for hhc needs as pt progresses.

## 2013-01-27 NOTE — ED Notes (Addendum)
Bipap discontinued per EDP order.  Placed on Ventimask FiO2 50% by RT.

## 2013-01-27 NOTE — Progress Notes (Signed)
CARDIAC REHAB PHASE I   PRE:  Rate/Rhythm: 90SR  BP:  Supine: 105/39  Sitting:   Standing:    SaO2: 95%5L  MODE:  Ambulation: 0   ft To cjair  POST:  Rate/Rhythm: 94SR PVCs  BP:  Supine:   Sitting: 121/45  Standing:    SaO2: 94%5L,  To 88% getting to chair 1500-1530 Assisted pt to chair. Pt happy to get out of bed. We will follow up tomorrow for walking. Call bell in reach. Sats to 88% getting to chair. 94% with rest.   Luetta Nutting, RN BSN  01/27/2013 3:29 PM

## 2013-01-27 NOTE — ED Notes (Addendum)
A&ox4; reports he is feeling better; states, "I can breathe now".  VS WNL except respiratory rate of 26. Pt on Bipap - tolerating well.

## 2013-01-27 NOTE — Progress Notes (Signed)
ANTICOAGULATION CONSULT NOTE - Initial Consult  Pharmacy Consult for Coumadin Indication: Mechanical AVR/MVR, Afib  No Known Allergies  Patient Measurements: Height: 5' 6.5" (168.9 cm) Weight: 158 lb 15.2 oz (72.1 kg) IBW/kg (Calculated) : 64.95 Heparin Dosing Weight:   Vital Signs: Temp: 97.7 F (36.5 C) (07/25 1130) Temp src: Oral (07/25 1130) BP: 117/79 mmHg (07/25 1200) Pulse Rate: 80 (07/25 1200)  Labs:  Recent Labs  01/25/13 0832 01/25/13 0835 01/27/13 0703  HGB 8.0*  --  9.5*  HCT 24.2*  --  28.6*  PLT 354  --  351  LABPROT  --   --  29.3*  INR  --  3.6 2.90*  CREATININE  --   --  1.22  TROPONINI  --   --  0.45*    Estimated Creatinine Clearance: 54 ml/min (by C-G formula based on Cr of 1.22).   Medical History: Past Medical History  Diagnosis Date  . Hypertension   . CAD (coronary artery disease) 2003    a. CABG/MVR/AVR-2003 (#25 St. Jude/#21 St. Jude); anticoagulation; b. negative stress nuclear-2005;  c. 01/2013 NSTEMI/Cath/PCI: LM nl, LAD 50p, 40-66m, D1 50ost, LCX 29m, RCA 50/38m (2.5x20 Promus DES), 50d, VG->Diag 100, VG->PDA 100, VG->OM 100, LIMA->LAD ok.  . Epistaxis     requiring cautery & aterial ligation-09/2009  . GERD (gastroesophageal reflux disease)   . Hyperlipemia   . Abnormal LFTs     possible cirrhosis  . Chronic anticoagulation   . Valvular heart disease     a. 2003: MVR/AVR-2003 (#25 St. Jude/#21 St. Jude);  b. 01/2013 Echo: EF 55%, Mech AVR mean grad 12, Mech MVR mean grad 6.  . Tobacco abuse     45 pack years  . Fasting hyperglycemia   . Nephrolithiasis   . Diverticulosis   . Hyponatremia   . Chronic diastolic CHF (congestive heart failure)   . Chronic renal disease   . Hemolytic anemia     history of  . ETOH abuse   . A-fib 01/16/2013    a. on coumadin  . COPD (chronic obstructive pulmonary disease)     CONTROLED    Medications:  Prescriptions prior to admission  Medication Sig Dispense Refill  . acetaminophen  (TYLENOL) 325 MG tablet Take 2 tablets (650 mg total) by mouth every 6 (six) hours as needed. For pain  30 tablet  0  . amLODipine (NORVASC) 10 MG tablet Take 1 tablet (10 mg total) by mouth daily.      Marland Kitchen atorvastatin (LIPITOR) 40 MG tablet Take 40 mg by mouth daily.      . clopidogrel (PLAVIX) 75 MG tablet Take 1 tablet (75 mg total) by mouth daily with breakfast.  30 tablet  4  . ferrous sulfate 325 (65 FE) MG EC tablet Take 325 mg by mouth daily with breakfast.      . folic acid (FOLVITE) 1 MG tablet Take 1 tablet (1 mg total) by mouth daily.  30 tablet  4  . furosemide (LASIX) 40 MG tablet Take 1 tablet (40 mg total) by mouth daily.  30 tablet  4  . HYDROcodone-acetaminophen (LORTAB) 10-500 MG per tablet Take 1 tablet by mouth 4 times daily as needed for pain.       Marland Kitchen levalbuterol (XOPENEX) 0.63 MG/3ML nebulizer solution Take 3 mLs (0.63 mg total) by nebulization every 6 (six) hours as needed for wheezing or shortness of breath.  3 mL  4  . lisinopril (PRINIVIL,ZESTRIL) 5 MG tablet Take 4 tablets (  20 mg total) by mouth daily.  30 tablet  4  . metoprolol (LOPRESSOR) 50 MG tablet Take 2 tablets (100 mg total) by mouth 2 (two) times daily.      . Multiple Vitamin (MULTIVITAMIN WITH MINERALS) TABS Take 1 tablet by mouth daily.      Marland Kitchen omeprazole (PRILOSEC) 20 MG capsule Take 40 mg by mouth daily.       Marland Kitchen thiamine 100 MG tablet Take 1 tablet (100 mg total) by mouth daily.  30 tablet  4  . warfarin (COUMADIN) 5 MG tablet Take 1.5 tablets (7.5 mg total) by mouth daily. Return to [facility] for INR on Wed, 01/25/2013 at 9:20 AM.        Assessment: 66yom on chronic Coumadin for hx mechanical MVR/AVR and Afib. INR (2.9) is therapeutic. Confirmed Coumadin PTA regimen (7.5mg  daily) with patient - last dose 7/24. - Hg low but improved since last admission, Plts wnl - No significant bleeding reported  Goal of Therapy:  INR 2.5-3.5   Plan:  1. Continue PTA Coumadin regimen - 7.5mg   daily 2. Daily INR  Cleon Dew [phone] 01/27/2013,2:04 PM

## 2013-01-27 NOTE — ED Provider Notes (Signed)
CSN: [id number]     Arrival date & time 01/27/13  0617 History  This chart was scribed for Benny Lennert, MD by Bennett Scrape, ED Scribe. This patient was seen in room APA02/APA02 and the patient's care was started at 7:11 AM.   First MD Initiated Contact with Patient 01/27/13 [phone]     Chief Complaint  Patient presents with  . Respiratory Distress   Level 5 Caveat-Respiratory Distress   The history is provided by the patient. No language interpreter was used.   HPI Comments: [name] is a 67 y.o. male brought in by ambulance, who presents to the Emergency Department complaining of severe SOB that woke him up from sleep this morning. He originally presented in the tripod position but improved with cpap. He denies having any pain. He denies taking his daily medications today.  PCP is Dr. Juanetta Gosling Cardiologist is Dr. Dietrich Pates  Past Medical History  Diagnosis Date  . Hypertension   . Arteriosclerotic cardiovascular disease (ASCVD) 2003    CABG/MVR/AVR-2003 (#25 St. Jude/#21 St. Jude); anticoagulation; negative stress nuclear-2005  . Epistaxis     requiring catery & aterial ligation-09/2009  . GERD (gastroesophageal reflux disease)   . Hyperlipemia   . Abnormal LFTs     possible cirrhosis  . Chronic anticoagulation   . Valvular heart disease     S/P AVR/MVR  . Tobacco abuse     45 pack years  . Fasting hyperglycemia   . Nephrolithiasis   . Anemia     hemolytic anemia  . Diverticulosis   . Hyponatremia   . CHF (congestive heart failure)   . Chronic renal disease   . Hemolytic anemia     history of  . ETOH abuse   . Coronary artery disease   . Myocardial infarction 01/16/2013    NSTEMI  . Anginal pain   . Dysrhythmia 01/16/2013    ATRIAL FIB WITH RVR  . COPD (chronic obstructive pulmonary disease)     CONTROLED  . Shortness of breath    Past Surgical History  Procedure Laterality Date  . Endocopic sphenopalatine artery ligation & cautry    . Inguinal  hernia repair      Left & right  . Coronary artery bypass graft  11/2001    [facility]  . Cardiac valve replacement  11/2001    AVR and MVR-St. Jude devices  . Cataract extraction w/phaco  01/20/2011    Procedure: CATARACT EXTRACTION PHACO AND INTRAOCULAR LENS PLACEMENT (IOC);  Surgeon: Loraine Leriche T. Nile Riggs;  Location: [facility] ORS;  Service: Ophthalmology;  Laterality: Right;  CDE: 8.51  . Cataract extraction w/phaco  02/03/2011    Procedure: CATARACT EXTRACTION PHACO AND INTRAOCULAR LENS PLACEMENT (IOC);  Surgeon: Loraine Leriche T. Nile Riggs;  Location: [facility] ORS;  Service: Ophthalmology;  Laterality: Left;  CDE:10.01  . Colonoscopy  04/05/02; 08/2011    friable anal canal hemorrhoids otherwise normal; 2 diminutive polyps excised, minimal diverticulosis noted  . Esophagogastroduodenoscopy  01/2002    Dr. Karilyn Cota, submucosal esophageal lesion c/w leiomyoma  . Colonoscopy  09/02/2011    Procedure: COLONOSCOPY;  Surgeon: Corbin Ade, MD;  Location: [facility] ENDO SUITE;  Service: Endoscopy;  Laterality: N/A;  8:15   Family History  Problem Relation Age of Onset  . Hypotension Neg Hx   . Anesthesia problems Neg Hx   . Malignant hyperthermia Neg Hx   . Pseudochol deficiency Neg Hx   . Colon cancer Neg Hx   . Liver disease Neg Hx  History  Substance Use Topics  . Smoking status: Current Every Day Smoker -- 1.00 packs/day for 52 years    Types: Cigarettes  . Smokeless tobacco: Never Used     Comment: STATES HE IS CUTTING DOWN ON  SMOKING HIMSELF   . Alcohol Use: 4.2 oz/week    6 Cans of beer, 1 Shots of liquor per week     Comment: DAILY    Review of Systems  Unable to perform ROS: Severe respiratory distress    Allergies  Review of patient's allergies indicates no known allergies.  Home Medications   Current Outpatient Rx  Name  Route  Sig  Dispense  Refill  . acetaminophen (TYLENOL) 325 MG tablet   Oral   Take 2 tablets (650 mg total) by mouth every 6 (six) hours as needed. For pain   30 tablet    0   . amLODipine (NORVASC) 10 MG tablet   Oral   Take 1 tablet (10 mg total) by mouth daily.         Marland Kitchen atorvastatin (LIPITOR) 40 MG tablet   Oral   Take 40 mg by mouth daily.         . clopidogrel (PLAVIX) 75 MG tablet   Oral   Take 1 tablet (75 mg total) by mouth daily with breakfast.   30 tablet   4   . ferrous sulfate 325 (65 FE) MG EC tablet   Oral   Take 325 mg by mouth daily with breakfast.         . folic acid (FOLVITE) 1 MG tablet   Oral   Take 1 tablet (1 mg total) by mouth daily.   30 tablet   4   . furosemide (LASIX) 40 MG tablet   Oral   Take 1 tablet (40 mg total) by mouth daily.   30 tablet   4   . HYDROcodone-acetaminophen (LORTAB) 10-500 MG per tablet   Oral   Take 1 tablet by mouth 4 times daily as needed for pain.          Marland Kitchen levalbuterol (XOPENEX) 0.63 MG/3ML nebulizer solution   Nebulization   Take 3 mLs (0.63 mg total) by nebulization every 6 (six) hours as needed for wheezing or shortness of breath.   3 mL   4   . lisinopril (PRINIVIL,ZESTRIL) 5 MG tablet   Oral   Take 4 tablets (20 mg total) by mouth daily.   30 tablet   4   . metoprolol (LOPRESSOR) 50 MG tablet   Oral   Take 2 tablets (100 mg total) by mouth 2 (two) times daily.         . Multiple Vitamin (MULTIVITAMIN WITH MINERALS) TABS   Oral   Take 1 tablet by mouth daily.         Marland Kitchen omeprazole (PRILOSEC) 20 MG capsule   Oral   Take 40 mg by mouth daily.          Marland Kitchen thiamine 100 MG tablet   Oral   Take 1 tablet (100 mg total) by mouth daily.   30 tablet   4   . warfarin (COUMADIN) 5 MG tablet   Oral   Take 1.5 tablets (7.5 mg total) by mouth daily. Return to [facility] for INR on Wed, 01/25/2013 at 9:20 AM.          Triage Vitals: BP 111/62  Pulse 86  Resp 33  SpO2 99%  Physical Exam  Nursing  note and vitals reviewed. Constitutional: He is oriented to person, place, and time. He appears well-developed and well-nourished.  HENT:   Head: Normocephalic and atraumatic.  Eyes: Conjunctivae and EOM are normal. No scleral icterus.  Neck: Neck supple. No thyromegaly present.  Cardiovascular: Normal rate and regular rhythm.  Exam reveals no gallop and no friction rub.   No murmur heard. Pulmonary/Chest: No stridor. He has wheezes (mild wheezing bilaterally). He has no rales. He exhibits no tenderness.  Pt is on bipap  Abdominal: He exhibits no distension. There is no tenderness. There is no rebound.  Musculoskeletal: Normal range of motion. He exhibits no edema.  Lymphadenopathy:    He has no cervical adenopathy.  Neurological: He is alert and oriented to person, place, and time. Coordination normal.  Skin: Skin is warm and dry. No rash noted. No erythema.  Psychiatric: He has a normal mood and affect. His behavior is normal.    ED Course   Procedures (including critical care time)  DIAGNOSTIC STUDIES: Oxygen Saturation is 100% on bipap, normal by my interpretation.    COORDINATION OF CARE: 7:15 AM- Pt states that his breathing is improved on bipap. Will continue to monitor closely.  8:35 AM-Per ED nurse, pt's O2 stats dropped to 84% on Ventimask 50%   8:37 AM-Pt rechecked and O2 stat is 90% on NRB. Pt continues to deny CP. Discussed transfer and admission to [facility] with pt and pt agreed. 8:57 AM-Consult complete with Dr. Antoine Poche, hospitalist. Patient case explained and discussed. Dr. Antoine Poche agrees to admit patient for further evaluation and treatment at [facility], CCU. Call ended at 9:00 AM.  Labs Reviewed  CBC WITH DIFFERENTIAL - Abnormal; Notable for the following:    RBC 3.31 (*)    Hemoglobin 9.5 (*)    HCT 28.6 (*)    RDW 16.9 (*)    Neutrophils Relative % 90 (*)    Lymphocytes Relative 4 (*)    Lymphs Abs 0.3 (*)    All other components within normal limits  BASIC METABOLIC PANEL  TROPONIN I  PROTIME-INR  HEPATIC FUNCTION PANEL  PRO B NATRIURETIC PEPTIDE   No results found. No diagnosis found.   Date: 01/27/2013  Rate:84  Rhythm: normal sinus rhythm  QRS Axis: normal  Intervals: normal  ST/T Wave abnormalities: ST depressions laterally  Some elevation inferiorly appears old  Conduction Disutrbances:none  Narrative Interpretation:   Old EKG Reviewed: changes noted  CRITICAL CARE Performed by: Shelby Peltz L Total critical care time: 45 Critical care time was exclusive of separately billable procedures and treating other patients. Critical care was necessary to treat or prevent imminent or life-threatening deterioration. Critical care was time spent personally by me on the following activities: development of treatment plan with patient and/or surrogate as well as nursing, discussions with consultants, evaluation of patient's response to treatment, examination of patient, obtaining history from patient or surrogate, ordering and performing treatments and interventions, ordering and review of laboratory studies, ordering and review of radiographic studies, pulse oximetry and re-evaluation of patient's condition.  MDM  chf  The chart was scribed for me under my direct supervision.  I personally performed the history, physical, and medical decision making and all procedures in the evaluation of this patient.Benny Lennert, MD 01/27/13 [phone]

## 2013-01-27 NOTE — ED Notes (Signed)
Patient presents to ER via [facility] with c/o respiratory distress.  Patient on CPAP on arrival.  Patient in tripod position; continues to have difficulty breathing on CPAP.

## 2013-01-27 NOTE — ED Notes (Signed)
CRITICAL VALUE ALERT  Critical value received:  Troponin 0.45  Date of notification:  01/27/13  Time of notification:  0750  Critical value read back: yes  Nurse who received alert:  Audie Pinto   MD notified (1st page):  [place]  Time of first page:  [phone]

## 2013-01-28 DIAGNOSIS — I5021 Acute systolic (congestive) heart failure: Secondary | ICD-10-CM

## 2013-01-28 LAB — URINE CULTURE

## 2013-01-28 LAB — BASIC METABOLIC PANEL
CO2: 29 mEq/L (ref 19–32)
Calcium: 8.5 mg/dL (ref 8.4–10.5)
GFR calc Af Amer: 71 mL/min — ABNORMAL LOW (ref >90)
GFR calc non Af Amer: 61 mL/min — ABNORMAL LOW (ref >90)
Sodium: 131 mEq/L — ABNORMAL LOW (ref 135–145)

## 2013-01-28 LAB — CBC
MCH: 29.3 pg (ref 26.0–34.0)
Platelets: 333 10*3/uL (ref 150–400)
RBC: 2.83 MIL/uL — ABNORMAL LOW (ref 4.22–5.81)

## 2013-01-28 LAB — GLUCOSE, CAPILLARY
Glucose-Capillary: 110 mg/dL — ABNORMAL HIGH (ref 70–99)
Glucose-Capillary: 93 mg/dL (ref 70–99)
Glucose-Capillary: 122 mg/dL — ABNORMAL HIGH (ref 70–99)

## 2013-01-28 LAB — TROPONIN I: Troponin I: 11.81 ng/mL (ref <0.30)

## 2013-01-28 LAB — PROTIME-INR
INR: 3.37 — ABNORMAL HIGH (ref 0.00–1.49)
Prothrombin Time: 32.9 seconds — ABNORMAL HIGH (ref 11.6–15.2)

## 2013-01-28 LAB — LIPID PANEL: Cholesterol: 147 mg/dL (ref 0–200)

## 2013-01-28 MED ORDER — FUROSEMIDE 80 MG PO TABS
80.0000 mg | ORAL_TABLET | Freq: Two times a day (BID) | ORAL | Status: DC
  Administered 2013-01-28 – 2013-01-29 (×4): 80 mg via ORAL
  Filled 2013-01-28 (×7): qty 1

## 2013-01-28 NOTE — Progress Notes (Signed)
ANTICOAGULATION CONSULT NOTE - Initial Consult  Pharmacy Consult for Coumadin Indication: Mechanical AVR/MVR, Afib  No Known Allergies  Patient Measurements: Height: 5' 6.5" (168.9 cm) Weight: 151 lb 10.8 oz (68.8 kg) IBW/kg (Calculated) : 64.95 Heparin Dosing Weight:   Vital Signs: Temp: 98.5 F (36.9 C) (07/26 0714) Temp src: Oral (07/26 0714) BP: 146/71 mmHg (07/26 0800) Pulse Rate: 78 (07/26 0800)  Labs:  Recent Labs  01/27/13 0703 01/27/13 1401 01/27/13 1953 01/28/13 0119  HGB 9.5*  --   --  8.3*  HCT 28.6*  --   --  24.0*  PLT 351  --   --  333  LABPROT 29.3*  --   --  32.9*  INR 2.90*  --   --  3.37*  CREATININE 1.22  --   --  1.19  TROPONINI 0.45* 13.55* 16.54* 11.81*    Estimated Creatinine Clearance: 55.4 ml/min (by C-G formula based on Cr of 1.19).   Medical History: Past Medical History  Diagnosis Date  . Hypertension   . CAD (coronary artery disease) 2003    a. CABG/MVR/AVR-2003 (#25 St. Jude/#21 St. Jude); anticoagulation; b. negative stress nuclear-2005;  c. 01/2013 NSTEMI/Cath/PCI: LM nl, LAD 50p, 40-28m, D1 50ost, LCX 43m, RCA 50/19m (2.5x20 Promus DES), 50d, VG->Diag 100, VG->PDA 100, VG->OM 100, LIMA->LAD ok.  . Epistaxis     requiring cautery & aterial ligation-09/2009  . GERD (gastroesophageal reflux disease)   . Hyperlipemia   . Abnormal LFTs     possible cirrhosis  . Chronic anticoagulation   . Valvular heart disease     a. 2003: MVR/AVR-2003 (#25 St. Jude/#21 St. Jude);  b. 01/2013 Echo: EF 55%, Mech AVR mean grad 12, Mech MVR mean grad 6.  . Tobacco abuse     45 pack years  . Fasting hyperglycemia   . Nephrolithiasis   . Diverticulosis   . Hyponatremia   . Chronic diastolic CHF (congestive heart failure)   . Chronic renal disease   . Hemolytic anemia     history of  . ETOH abuse   . A-fib 01/16/2013    a. on coumadin  . COPD (chronic obstructive pulmonary disease)     CONTROLED    Medications:  Prescriptions prior to  admission  Medication Sig Dispense Refill  . acetaminophen (TYLENOL) 325 MG tablet Take 2 tablets (650 mg total) by mouth every 6 (six) hours as needed. For pain  30 tablet  0  . amLODipine (NORVASC) 10 MG tablet Take 1 tablet (10 mg total) by mouth daily.      Marland Kitchen atorvastatin (LIPITOR) 40 MG tablet Take 40 mg by mouth daily.      . clopidogrel (PLAVIX) 75 MG tablet Take 1 tablet (75 mg total) by mouth daily with breakfast.  30 tablet  4  . ferrous sulfate 325 (65 FE) MG EC tablet Take 325 mg by mouth daily with breakfast.      . folic acid (FOLVITE) 1 MG tablet Take 1 tablet (1 mg total) by mouth daily.  30 tablet  4  . furosemide (LASIX) 40 MG tablet Take 1 tablet (40 mg total) by mouth daily.  30 tablet  4  . HYDROcodone-acetaminophen (LORTAB) 10-500 MG per tablet Take 1 tablet by mouth 4 times daily as needed for pain.       Marland Kitchen levalbuterol (XOPENEX) 0.63 MG/3ML nebulizer solution Take 3 mLs (0.63 mg total) by nebulization every 6 (six) hours as needed for wheezing or shortness of breath.  3  mL  4  . lisinopril (PRINIVIL,ZESTRIL) 5 MG tablet Take 4 tablets (20 mg total) by mouth daily.  30 tablet  4  . metoprolol (LOPRESSOR) 50 MG tablet Take 2 tablets (100 mg total) by mouth 2 (two) times daily.      . Multiple Vitamin (MULTIVITAMIN WITH MINERALS) TABS Take 1 tablet by mouth daily.      Marland Kitchen omeprazole (PRILOSEC) 20 MG capsule Take 40 mg by mouth daily.       Marland Kitchen thiamine 100 MG tablet Take 1 tablet (100 mg total) by mouth daily.  30 tablet  4  . warfarin (COUMADIN) 5 MG tablet Take 1.5 tablets (7.5 mg total) by mouth daily. Return to [facility] for INR on Wed, 01/25/2013 at 9:20 AM.        Assessment: 66yom on chronic Coumadin for hx mechanical MVR/AVR and Afib. INR (3.37) remains therapeutic - continue PTA regimen.  - H/H and Plts trending down - monitor, MD checking stool - No significant bleeding reported  Goal of Therapy:  INR 2.5-3.5   Plan:  1. Continue PTA Coumadin  regimen - 7.5mg  daily 2. Daily INR  Cleon Dew [phone] 01/28/2013,8:59 AM

## 2013-01-28 NOTE — Progress Notes (Signed)
w/muscle use ,Pt has tremors  to bilat upper extremities

## 2013-01-28 NOTE — Progress Notes (Signed)
Subjective:  No chest pain. Breathing much better after IV diuresis and 7 lb weight loss. Hgb lower.   Objective:  Vital Signs in the last 24 hours: Temp:  [97.7 F (36.5 C)-98.7 F (37.1 C)] 98.5 F (36.9 C) (07/26 0714) Pulse Rate:  [65-98] 70 (07/26 0700) Resp:  [15-32] 23 (07/26 0700) BP: (105-160)/(34-86) 126/53 mmHg (07/26 0700) SpO2:  [84 %-100 %] 100 % (07/26 0714) FiO2 (%):  [50 %-100 %] 80 % (07/25 1200) Weight:  [151 lb 10.8 oz (68.8 kg)-158 lb 15.2 oz (72.1 kg)] 151 lb 10.8 oz (68.8 kg) (07/26 0500)  Intake/Output from previous day: 07/25 0701 - 07/26 0700 In: 440 [P.O.:440] Out: 4175 [Urine:4175] Intake/Output from this shift:    . albuterol  2.5 mg Nebulization Q6H  . amLODipine  10 mg Oral Daily  . atorvastatin  40 mg Oral Daily  . clopidogrel  75 mg Oral Q breakfast  . ferrous sulfate  325 mg Oral Q breakfast  . folic acid  1 mg Oral Daily  . furosemide  80 mg Intravenous BID  . insulin aspart  0-15 Units Subcutaneous TID WC  . lisinopril  20 mg Oral Daily  . metoprolol  100 mg Oral BID  . multivitamin with minerals  1 tablet Oral Daily  . nitroGLYCERIN  1 inch Topical Q6H  . pantoprazole  40 mg Oral Daily  . sodium chloride  3 mL Intravenous Q12H  . thiamine  100 mg Oral Daily  . warfarin  7.5 mg Oral q1800  . Warfarin - Pharmacist Dosing Inpatient   Does not apply q1800      Physical Exam: The patient appears to be in no distress.  Head and neck exam reveals that the pupils are equal and reactive.  The extraocular movements are full.  There is no scleral icterus.  Mouth and pharynx are benign.  No lymphadenopathy.  No carotid bruits.  The jugular venous pressure is normal.  Thyroid is not enlarged or tender.  Chest is clear to percussion and auscultation.  No rales or rhonchi.  Expansion of the chest is symmetrical.  Heart reveals good prosthetic valve clicks.  The abdomen is soft and nontender.  Bowel sounds are normoactive.  There is no  hepatosplenomegaly or mass.  There are no abdominal bruits.  Extremities reveal no phlebitis or edema.  Pedal pulses are good.  There is no cyanosis or clubbing.  Neurologic exam is normal strength and no lateralizing weakness.  No sensory deficits.  Integument reveals no rash  Lab Results:  Recent Labs  01/27/13 0703 01/28/13 0119  WBC 8.2 10.9*  HGB 9.5* 8.3*  PLT 351 333    Recent Labs  01/27/13 0703 01/28/13 0119  NA 123* 131*  K 4.9 4.0  CL 88* 93*  CO2 25 29  GLUCOSE 233* 98  BUN 28* 29*  CREATININE 1.22 1.19    Recent Labs  01/27/13 1953 01/28/13 0119  TROPONINI 16.54* 11.81*   Hepatic Function Panel  Recent Labs  01/27/13 0705  PROT 7.6  ALBUMIN 3.4*  AST 36  ALT 20  ALKPHOS 107  BILITOT 0.3  BILIDIR <0.1  IBILI NOT CALCULATED    Recent Labs  01/28/13 0119  CHOL 147   No results found for this basename: PROTIME,  in the last 72 hours  Imaging: Imaging results have been reviewed  Cardiac Studies: EKG shows no evolutionary changes. Assessment/Plan:  1. Acute on chronic diastolic congestive heart failure: Improved on IV lasix.  Plan: repeat portable chest today. 2. Coronary artery disease: Patient status post stenting of the native right coronary artery. He has one of 4 patent grafts (LIMA to the LAD). His ECG is markedly abnl though with slightly more pronounced ST/T changes than previously. Repeat is more like previous baseline ecg. His troponin is mildly elevated.  Troponins trending down. He is not on aspirin secondary to concomitant Plavix and Coumadin therapy. Cont statin, bb, acei.  3. S/P AVR/MVR: Continue coumadin. 4. HTN: Stable.  5. HL: F/u. Last checked in 2012. LFT's nl.  6. Tob/ETOH Abuse: Cessation advised. He is still smoking 1/2 ppd.  7. Hyponatremia: Improved. 8. Anemia: Normocytic, normochromic. Hgb down further to 8.3 this am.  Check stools. Continue protonix 9. Hyperglycemia:  10. Abnl UA: Asymptomatic. Afebrile.  Nl WBC. Repeat and culture   LOS: 1 day    Cassell Clement 01/28/2013, 7:56 AM

## 2013-01-28 NOTE — Progress Notes (Signed)
Pt ambulated with cardiac rehab. Therapist states patient was ambulating with oxygen [place] and walking very quickly. Pt then began to desat in the 80's. Pt instructed to slow down walking and use pursed lip breathing. Pt followed instructions and sats began to increase. Pt currently resting in chair, speaking with cardiac rehab. Oxygen on 3L [place] and pt sating 95%.

## 2013-01-28 NOTE — Progress Notes (Signed)
CARDIAC REHAB PHASE I   PRE:  Rate/Rhythm: 77 SR  BP:  Sitting: 103/37     SaO2: 95 3L  MODE:  Ambulation: 700 ft   POST:  Rate/Rhythm: 90SR  BP:  Sitting: 103/32     SaO2: 983L  14:50-15:25 Patient was steady during walk.  He wanted to walk at a quick pace but was reminded to slow down due to desaturation.  Patient was put on 5l for exercise but was increase to 8l due to sats in the low 80's.  Patient was stopped and reminded to purse lip breath.  Patient seemed inpatient and did not want to stop.  No c/o of feeling bad.  Patient was placed back on 3L and in his chair with call bell in reach.    Lindaann Slough [place], [place] 01/28/2013 3:22 PM

## 2013-01-29 ENCOUNTER — Inpatient Hospital Stay (HOSPITAL_COMMUNITY): Payer: Medicare Other

## 2013-01-29 LAB — BASIC METABOLIC PANEL
BUN: 26 mg/dL — ABNORMAL HIGH (ref 6–23)
GFR calc Af Amer: 78 mL/min — ABNORMAL LOW (ref >90)
GFR calc non Af Amer: 68 mL/min — ABNORMAL LOW (ref >90)
Potassium: 3.6 mEq/L (ref 3.5–5.1)
Sodium: 133 mEq/L — ABNORMAL LOW (ref 135–145)

## 2013-01-29 LAB — PROTIME-INR
INR: 3.51 — ABNORMAL HIGH (ref 0.00–1.49)
Prothrombin Time: 33.9 seconds — ABNORMAL HIGH (ref 11.6–15.2)

## 2013-01-29 LAB — CBC
MCHC: 33 g/dL (ref 30.0–36.0)
RDW: 17.6 % — ABNORMAL HIGH (ref 11.5–15.5)

## 2013-01-29 LAB — GLUCOSE, CAPILLARY: Glucose-Capillary: 107 mg/dL — ABNORMAL HIGH (ref 70–99)

## 2013-01-29 MED ORDER — POTASSIUM CHLORIDE CRYS ER 20 MEQ PO TBCR
20.0000 meq | EXTENDED_RELEASE_TABLET | Freq: Every day | ORAL | Status: DC
  Administered 2013-01-29 – 2013-01-31 (×3): 20 meq via ORAL
  Filled 2013-01-29 (×3): qty 1

## 2013-01-29 MED ORDER — WARFARIN SODIUM 5 MG PO TABS
5.0000 mg | ORAL_TABLET | Freq: Once | ORAL | Status: AC
  Administered 2013-01-29: 5 mg via ORAL
  Filled 2013-01-29: qty 1

## 2013-01-29 NOTE — Progress Notes (Signed)
ANTICOAGULATION CONSULT NOTE - Initial Consult  Pharmacy Consult for Coumadin Indication: Mechanical AVR/MVR, Afib  No Known Allergies  Patient Measurements: Height: 5' 6.5" (168.9 cm) Weight: 149 lb 0.5 oz (67.6 kg) IBW/kg (Calculated) : 64.95 Heparin Dosing Weight:   Vital Signs: Temp: 98.1 F (36.7 C) (07/27 1146) Temp src: Oral (07/27 1146) BP: 133/62 mmHg (07/27 1200) Pulse Rate: 64 (07/27 1200)  Labs:  Recent Labs  01/27/13 0703 01/27/13 1401 01/27/13 1953 01/28/13 0119 01/29/13 0451  HGB 9.5*  --   --  8.3* 8.6*  HCT 28.6*  --   --  24.0* 26.1*  PLT 351  --   --  333 388  LABPROT 29.3*  --   --  32.9* 33.9*  INR 2.90*  --   --  3.37* 3.51*  CREATININE 1.22  --   --  1.19 1.10  TROPONINI 0.45* 13.55* 16.54* 11.81*  --     Estimated Creatinine Clearance: 59.9 ml/min (by C-G formula based on Cr of 1.1).   Medical History: Past Medical History  Diagnosis Date  . Hypertension   . CAD (coronary artery disease) 2003    a. CABG/MVR/AVR-2003 (#25 St. Jude/#21 St. Jude); anticoagulation; b. negative stress nuclear-2005;  c. 01/2013 NSTEMI/Cath/PCI: LM nl, LAD 50p, 40-75m, D1 50ost, LCX 50m, RCA 50/25m (2.5x20 Promus DES), 50d, VG->Diag 100, VG->PDA 100, VG->OM 100, LIMA->LAD ok.  . Epistaxis     requiring cautery & aterial ligation-09/2009  . GERD (gastroesophageal reflux disease)   . Hyperlipemia   . Abnormal LFTs     possible cirrhosis  . Chronic anticoagulation   . Valvular heart disease     a. 2003: MVR/AVR-2003 (#25 St. Jude/#21 St. Jude);  b. 01/2013 Echo: EF 55%, Mech AVR mean grad 12, Mech MVR mean grad 6.  . Tobacco abuse     45 pack years  . Fasting hyperglycemia   . Nephrolithiasis   . Diverticulosis   . Hyponatremia   . Chronic diastolic CHF (congestive heart failure)   . Chronic renal disease   . Hemolytic anemia     history of  . ETOH abuse   . A-fib 01/16/2013    a. on coumadin  . COPD (chronic obstructive pulmonary disease)     CONTROLED     Medications:  Prescriptions prior to admission  Medication Sig Dispense Refill  . acetaminophen (TYLENOL) 325 MG tablet Take 2 tablets (650 mg total) by mouth every 6 (six) hours as needed. For pain  30 tablet  0  . amLODipine (NORVASC) 10 MG tablet Take 1 tablet (10 mg total) by mouth daily.      Marland Kitchen atorvastatin (LIPITOR) 40 MG tablet Take 40 mg by mouth daily.      . clopidogrel (PLAVIX) 75 MG tablet Take 1 tablet (75 mg total) by mouth daily with breakfast.  30 tablet  4  . ferrous sulfate 325 (65 FE) MG EC tablet Take 325 mg by mouth daily with breakfast.      . folic acid (FOLVITE) 1 MG tablet Take 1 tablet (1 mg total) by mouth daily.  30 tablet  4  . furosemide (LASIX) 40 MG tablet Take 1 tablet (40 mg total) by mouth daily.  30 tablet  4  . HYDROcodone-acetaminophen (LORTAB) 10-500 MG per tablet Take 1 tablet by mouth 4 times daily as needed for pain.       Marland Kitchen levalbuterol (XOPENEX) 0.63 MG/3ML nebulizer solution Take 3 mLs (0.63 mg total) by nebulization every 6 (six)  hours as needed for wheezing or shortness of breath.  3 mL  4  . lisinopril (PRINIVIL,ZESTRIL) 5 MG tablet Take 4 tablets (20 mg total) by mouth daily.  30 tablet  4  . metoprolol (LOPRESSOR) 50 MG tablet Take 2 tablets (100 mg total) by mouth 2 (two) times daily.      . Multiple Vitamin (MULTIVITAMIN WITH MINERALS) TABS Take 1 tablet by mouth daily.      Marland Kitchen omeprazole (PRILOSEC) 20 MG capsule Take 40 mg by mouth daily.       Marland Kitchen thiamine 100 MG tablet Take 1 tablet (100 mg total) by mouth daily.  30 tablet  4  . warfarin (COUMADIN) 5 MG tablet Take 1.5 tablets (7.5 mg total) by mouth daily. Return to [facility] for INR on Wed, 01/25/2013 at 9:20 AM.        Assessment: 66yom on chronic Coumadin for hx mechanical MVR/AVR and Afib. INR (3.51) is just above goal range - will decrease dose to 5mg  tonight and follow-up AM INr. - H/H and Plts improving - No significant bleeding reported  Goal of Therapy:   INR 2.5-3.5   Plan:  1. Coumadin 5mg  po x 1 today 2. Daily INR  Cleon Dew [phone] 01/29/2013,12:56 PM

## 2013-01-29 NOTE — Progress Notes (Signed)
Subjective:  No chest pain. He walked in hall yesterday but desaturated to the 80s. Previous heavy smoker. Presently on 3L/min. Patient had run of SVT with aberration this am 0710. Asymptomatic. No chest pain overnight.  Objective:  Vital Signs in the last 24 hours: Temp:  [97.2 F (36.2 C)-98.4 F (36.9 C)] 97.2 F (36.2 C) (07/27 0400) Pulse Rate:  [65-84] 75 (07/27 0700) Resp:  [16-28] 26 (07/27 0700) BP: (103-177)/(32-71) 149/62 mmHg (07/27 0600) SpO2:  [92 %-99 %] 98 % (07/27 0725) Weight:  [149 lb 0.5 oz (67.6 kg)] 149 lb 0.5 oz (67.6 kg) (07/27 0500)  Intake/Output from previous day: 07/26 0701 - 07/27 0700 In: 1080 [P.O.:1080] Out: 3125 [Urine:3125] Intake/Output from this shift:    . albuterol  2.5 mg Nebulization Q6H  . amLODipine  10 mg Oral Daily  . atorvastatin  40 mg Oral Daily  . clopidogrel  75 mg Oral Q breakfast  . ferrous sulfate  325 mg Oral Q breakfast  . folic acid  1 mg Oral Daily  . furosemide  80 mg Oral BID  . insulin aspart  0-15 Units Subcutaneous TID WC  . lisinopril  20 mg Oral Daily  . metoprolol  100 mg Oral BID  . multivitamin with minerals  1 tablet Oral Daily  . nitroGLYCERIN  1 inch Topical Q6H  . pantoprazole  40 mg Oral Daily  . sodium chloride  3 mL Intravenous Q12H  . thiamine  100 mg Oral Daily  . warfarin  7.5 mg Oral q1800  . Warfarin - Pharmacist Dosing Inpatient   Does not apply q1800      Physical Exam: The patient appears to be in no distress.  Head and neck exam reveals that the pupils are equal and reactive.  The extraocular movements are full.  There is no scleral icterus.  Mouth and pharynx are benign.  No lymphadenopathy.  No carotid bruits.  The jugular venous pressure is normal.  Thyroid is not enlarged or tender.  Chest is clear to percussion and auscultation.  No rales or rhonchi.  Expansion of the chest is symmetrical.  Heart reveals good prosthetic valve clicks.  The abdomen is soft and nontender.   Bowel sounds are normoactive.  There is no hepatosplenomegaly or mass.  There are no abdominal bruits.  Extremities reveal no phlebitis or edema.  Pedal pulses are good.  There is no cyanosis or clubbing.  Neurologic exam is normal strength and no lateralizing weakness.  No sensory deficits.  Integument reveals no rash  Lab Results:  Recent Labs  01/28/13 0119 01/29/13 0451  WBC 10.9* 8.9  HGB 8.3* 8.6*  PLT 333 388    Recent Labs  01/28/13 0119 01/29/13 0451  NA 131* 133*  K 4.0 3.6  CL 93* 93*  CO2 29 33*  GLUCOSE 98 102*  BUN 29* 26*  CREATININE 1.19 1.10    Recent Labs  01/27/13 1953 01/28/13 0119  TROPONINI 16.54* 11.81*   Hepatic Function Panel  Recent Labs  01/27/13 0705  PROT 7.6  ALBUMIN 3.4*  AST 36  ALT 20  ALKPHOS 107  BILITOT 0.3  BILIDIR <0.1  IBILI NOT CALCULATED    Recent Labs  01/28/13 0119  CHOL 147   No results found for this basename: PROTIME,  in the last 72 hours  Imaging: Imaging results have been reviewed  Cardiac Studies: EKG shows no evolutionary changes. Assessment/Plan:  1. Acute on chronic diastolic congestive heart failure: Improved. Now  on oral lasix.   Plan: repeat portable chest today.Weight down 2 more pounds. Taper down on nasal O2. 2. Coronary artery disease: Patient status post stenting of the native right coronary artery. He has one of 4 patent grafts (LIMA to the LAD). His ECG is markedly abnl though with slightly more pronounced ST/T changes than previously. Repeat is more like previous baseline ecg. His troponin is mildly elevated.  Troponins trending down. He is not on aspirin secondary to concomitant Plavix and Coumadin therapy. Cont statin, bb, acei.  3. S/P AVR/MVR: Continue coumadin. 4. HTN: Stable.  5. HL: F/u. Last checked in 2012. LFT's nl.  6. Tob/ETOH Abuse: Cessation advised. He is still smoking 1/2 ppd.  7. Hyponatremia: Improved. 8. Anemia: Normocytic, normochromic. Hgb stable 8.6 Check  stools. Continue protonix 9. Hyperglycemia:  10. Abnl UA: Asymptomatic. Afebrile. Nl WBC. Repeat and culture 11. Paroxysmal SVT.  Potassium borderline 3.6. Will replete.   LOS: 2 days    [name] 01/29/2013, 7:49 AM

## 2013-01-30 DIAGNOSIS — R8290 Unspecified abnormal findings in urine: Secondary | ICD-10-CM | POA: Insufficient documentation

## 2013-01-30 DIAGNOSIS — I471 Supraventricular tachycardia, unspecified: Secondary | ICD-10-CM | POA: Diagnosis present

## 2013-01-30 DIAGNOSIS — I214 Non-ST elevation (NSTEMI) myocardial infarction: Secondary | ICD-10-CM | POA: Diagnosis present

## 2013-01-30 DIAGNOSIS — I5033 Acute on chronic diastolic (congestive) heart failure: Secondary | ICD-10-CM | POA: Diagnosis present

## 2013-01-30 LAB — BASIC METABOLIC PANEL
Chloride: 92 mEq/L — ABNORMAL LOW (ref 96–112)
GFR calc non Af Amer: 58 mL/min — ABNORMAL LOW (ref >90)
Glucose, Bld: 124 mg/dL — ABNORMAL HIGH (ref 70–99)
Potassium: 3.9 mEq/L (ref 3.5–5.1)
Sodium: 132 mEq/L — ABNORMAL LOW (ref 135–145)

## 2013-01-30 LAB — CBC
Hemoglobin: 9 g/dL — ABNORMAL LOW (ref 13.0–17.0)
MCHC: 33.1 g/dL (ref 30.0–36.0)
RBC: 3.18 MIL/uL — ABNORMAL LOW (ref 4.22–5.81)
WBC: 7.9 10*3/uL (ref 4.0–10.5)

## 2013-01-30 LAB — PROTIME-INR
INR: 3.12 — ABNORMAL HIGH (ref 0.00–1.49)
Prothrombin Time: 31 seconds — ABNORMAL HIGH (ref 11.6–15.2)

## 2013-01-30 LAB — GLUCOSE, CAPILLARY: Glucose-Capillary: 93 mg/dL (ref 70–99)

## 2013-01-30 MED ORDER — WARFARIN SODIUM 7.5 MG PO TABS
7.5000 mg | ORAL_TABLET | Freq: Once | ORAL | Status: AC
  Administered 2013-01-30: 7.5 mg via ORAL
  Filled 2013-01-30: qty 1

## 2013-01-30 MED ORDER — IPRATROPIUM BROMIDE 0.02 % IN SOLN: 0.5000 mg | Freq: Two times a day (BID) | RESPIRATORY_TRACT | Status: DC

## 2013-01-30 MED ORDER — ALBUTEROL SULFATE (5 MG/ML) 0.5% IN NEBU
2.5000 mg | INHALATION_SOLUTION | Freq: Two times a day (BID) | RESPIRATORY_TRACT | Status: DC
  Administered 2013-01-30 – 2013-01-31 (×3): 2.5 mg via RESPIRATORY_TRACT
  Filled 2013-01-30 (×3): qty 0.5

## 2013-01-30 MED ORDER — FUROSEMIDE 80 MG PO TABS
80.0000 mg | ORAL_TABLET | Freq: Every day | ORAL | Status: DC
  Administered 2013-01-30 – 2013-01-31 (×2): 80 mg via ORAL
  Filled 2013-01-30 (×2): qty 1

## 2013-01-30 MED ORDER — IPRATROPIUM BROMIDE 0.02 % IN SOLN
0.5000 mg | Freq: Two times a day (BID) | RESPIRATORY_TRACT | Status: DC
  Administered 2013-01-30 – 2013-01-31 (×3): 0.5 mg via RESPIRATORY_TRACT
  Filled 2013-01-30 (×3): qty 2.5

## 2013-01-30 MED ORDER — ALBUTEROL SULFATE (5 MG/ML) 0.5% IN NEBU: 2.5000 mg | INHALATION_SOLUTION | Freq: Four times a day (QID) | RESPIRATORY_TRACT | Status: DC | PRN

## 2013-01-30 NOTE — Progress Notes (Signed)
CARDIAC REHAB PHASE I   PRE:  Rate/Rhythm: 76SR  BP:  Supine:   Sitting: 97/40  Standing:    SaO2: 93%RA  MODE:  Ambulation: 700 ft   POST:  Rate/Rhythm: 86SR  BP:  Supine:   Sitting: 115/45  Standing:    SaO2: above 90%RA walking 1440-1500 Pt getting ready to transfer. Walked 700 ft on RA with hand held asst. Talked whole walk. No SOB noted. Sats maintained above 90% RA whole walk. Denied dizziness with low BP. Tolerated well. To wheelchair for transfer. Encouraged pt to cook some meals at home to decrease salt intake.   Luetta Nutting, RN BSN  01/30/2013 2:55 PM

## 2013-01-30 NOTE — Plan of Care (Signed)
Problem: Food- and Nutrition-Related Knowledge Deficit (NB-1.1) Goal: Nutrition education Formal process to instruct or train a patient/client in a skill or to impart knowledge to help patients/clients voluntarily manage or modify food choices and eating behavior to maintain or improve health. Outcome: Progressing Nutrition Education Note  RD consulted for nutrition education regarding new onset CHF.  RD provided "Low Sodium Nutrition Therapy" handout from the Academy of [employer]. Reviewed patient's dietary recall. Provided examples on ways to decrease sodium intake in diet. Discouraged intake of processed foods and use of salt shaker. Encouraged fresh fruits and vegetables as well as whole grain sources of carbohydrates to maximize fiber intake.   RD discussed why it is important for patient to adhere to diet recommendations, and emphasized the role of fluids, foods to avoid, and importance of weighing self daily. Teach back method used.  Pt lives at home alone and eats out for all meals.  Pt does not like to be at home alone without his wife since her passing 6 years ago.  Pt drinks several beers/day (does not specify number) and states "my doctor's been on me already."  Pt is motivated to make change, however best approach at this time is to maximize low sodium options while eating out.  This was discussed with pt.   Expect good compliance in initiating plan at home.  Body mass index is 23.49 kg/(m^2). Pt meets criteria for wnl based on current BMI.  Current diet order is heart healthy, patient is consuming approximately 100% of meals at this time. Labs and medications reviewed. No further nutrition interventions warranted at this time. RD contact information provided. If additional nutrition issues arise, please re-consult RD.   Loyce Dys, MS RD LDN Clinical Inpatient Dietitian Pager: [phone] Weekend/After hours pager: [phone]

## 2013-01-30 NOTE — Progress Notes (Addendum)
Patient Name: [name] Date of Encounter: 01/30/2013  Active Problems:   HYPERLIPIDEMIA   HYPONATREMIA, CHRONIC   ANEMIA   TOBACCO ABUSE   MITRAL VALVE REPLACEMENT, HX OF   AORTIC VALVE REPLACEMENT, HX OF   Chronic anticoagulation   Arteriosclerotic cardiovascular disease (ASCVD)   Hypertension   Fasting hyperglycemia   Paroxysmal SVT (supraventricular tachycardia)   Abnormal urinalysis    SUBJECTIVE: No chest pain, SOB greatly improved, has many canned foods at home, he will make changes. Also willing to attempt smoking cessation.  OBJECTIVE Filed Vitals:   01/30/13 0300 01/30/13 0400 01/30/13 0500 01/30/13 0600  BP:  142/53  149/65  Pulse: 71 72 74 88  Temp:  98.8 F (37.1 C)    TempSrc:  Oral    Resp: 22 14 16 18   Height:      Weight:   147 lb 11.3 oz (67 kg)   SpO2: 95% 97% 97% 91%    Intake/Output Summary (Last 24 hours) at 01/30/13 0708 Last data filed at 01/30/13 0600  Gross per 24 hour  Intake    360 ml  Output   2375 ml  Net  -2015 ml   Filed Weights   01/28/13 0500 01/29/13 0500 01/30/13 0500  Weight: 151 lb 10.8 oz (68.8 kg) 149 lb 0.5 oz (67.6 kg) 147 lb 11.3 oz (67 kg)    PHYSICAL EXAM General: Well developed, well nourished, male in no acute distress. Head: Normocephalic, atraumatic.  Neck: Supple without bruits, JVD about 10 cm. Lungs:  Resp regular and unlabored, dry rales. Heart: RRR, S1, S2, no S3, S4, or murmur; no rub. Abdomen: Soft, non-tender, non-distended, BS + x 4.  Extremities: No clubbing, cyanosis, no edema.  Neuro: Alert and oriented X 3. Moves all extremities spontaneously. Psych: Normal affect.  LABS: CBC:  Recent Labs  01/29/13 0451 01/30/13 0445  WBC 8.9 7.9  HGB 8.6* 9.0*  HCT 26.1* 27.2*  MCV 85.0 85.5  PLT 388 423*   INR:  Recent Labs  01/30/13 0445  INR 3.12*   Basic Metabolic Panel:  Recent Labs  16/10/96 0451 01/30/13 0445  NA 133* 132*  K 3.6 3.9  CL 93* 92*  CO2 33* 30  GLUCOSE  102* 124*  BUN 26* 30*  CREATININE 1.10 1.25  CALCIUM 8.5 8.9   Cardiac Enzymes:  Recent Labs  01/27/13 1401 01/27/13 1953 01/28/13 0119  TROPONINI 13.55* 16.54* 11.81*   BNP: Pro B Natriuretic peptide (BNP)  Date/Time Value Range Status  01/27/2013  7:05 AM 5719.0* 0 - 125 pg/mL Final  01/20/2013  4:10 AM 6499.0* 0 - 125 pg/mL Final   Hemoglobin A1C:  Recent Labs  01/27/13 1401  HGBA1C 5.1   Fasting Lipid Panel:  Recent Labs  01/28/13 0119  CHOL 147  HDL 64  LDLCALC 74  TRIG 47  CHOLHDL 2.3   TELE:  SR, few PVC's and pairs    Radiology/Studies: Dg Chest Port 1v Same Day 01/29/2013   *RADIOLOGY REPORT*  Clinical Data:  Congestive heart failure  PORTABLE CHEST - 1 VIEW  Comparison: January 27, 2013  Findings:  There is a degree of underlying emphysema.  There has been partial but incomplete clearing of edema compared to recent prior study.  There is no no airspace consolidation currently. Cardiomegaly persists.  The pulmonary vascularity is consistent with underlying emphysema.  No adenopathy.  The patient is status post mitral valve replacement.  IMPRESSION: Evidence of congestive heart failure superimposed on  emphysematous change.  There is less edema compared to recent prior study. Currently no air space consolidations or effusions.  No new opacity.   Original Report Authenticated By: Bretta Bang, M.D.     Current Medications:  . albuterol  2.5 mg Nebulization BID  . amLODipine  10 mg Oral Daily  . atorvastatin  40 mg Oral Daily  . clopidogrel  75 mg Oral Q breakfast  . ferrous sulfate  325 mg Oral Q breakfast  . folic acid  1 mg Oral Daily  . furosemide  80 mg Oral BID  . insulin aspart  0-15 Units Subcutaneous TID WC  . ipratropium  0.5 mg Nebulization BID  . lisinopril  20 mg Oral Daily  . metoprolol  100 mg Oral BID  . multivitamin with minerals  1 tablet Oral Daily  . nitroGLYCERIN  1 inch Topical Q6H  . pantoprazole  40 mg Oral Daily  . potassium  chloride  20 mEq Oral Daily  . sodium chloride  3 mL Intravenous Q12H  . thiamine  100 mg Oral Daily  . Warfarin - Pharmacist Dosing Inpatient   Does not apply q1800      ASSESSMENT AND PLAN: See Dr Yevonne Pax A/P from 01/29/2013 below.  Principal Problem:   Acute on chronic diastolic CHF (congestive heart failure), NYHA class 4 - wt down 11 lbs since admit, on oral Lasix; No Hx CHF. MD advise on decreasing Lasix to 80 mg daily (PTA 40 mg daily) since BUN/Cr increasing. Feel volume status close to baseline. COPD may also contribute, ck sats with ambulation.   Active Problems:   HYPERLIPIDEMIA - continue current therapy    HYPONATREMIA, CHRONIC - 123 on admit, improved, continue to follow.    ANEMIA - improving, possibly with diuresis    TOBACCO ABUSE - encouraged cessation    MITRAL VALVE REPLACEMENT, HX OF/AORTIC VALVE REPLACEMENT, HX OF - Coumadin    Chronic anticoagulation - Coumadin is therapeutic    NSTEMI/Arteriosclerotic cardiovascular disease (ASCVD) - Clear crescendo/decrescendo pattern to enzymes. No chest pain, recent NSTEMI with PCI RCA but SVG had closed and OM 3 had 80% lesion, medical Rx. No ST elevation. Continue current meds with Plavix (no ASA because of coumadin), BB, statin    Hypertension - SBP briefly 80s yest pm, generally higher, continue current meds    Fasting hyperglycemia - A1c is OK, d/c SSI    Paroxysmal SVT (supraventricular tachycardia) - follow, only a few PVC's overnight    Abnormal urinalysis - Culture was negative, follow for Sx.  Plan: if no d/c today, tx telemetry  7/27 A/P  1. Acute on chronic diastolic congestive heart failure: Improved. Now on oral lasix.  Plan: repeat portable chest today.Weight down 2 more pounds. Taper down on nasal O2.  2. Coronary artery disease: Patient status post stenting of the native right coronary artery. He has one of 4 patent grafts (LIMA to the LAD). His ECG is markedly abnl though with slightly more  pronounced ST/T changes than previously. Repeat is more like previous baseline ecg. His troponin is mildly elevated. Troponins trending down. He is not on aspirin secondary to concomitant Plavix and Coumadin therapy. Cont statin, bb, acei.  3. S/P AVR/MVR: Continue coumadin.  4. HTN: Stable.  5. HL: F/u. Last checked in 2012. LFT's nl.  6. Tob/ETOH Abuse: Cessation advised. He is still smoking 1/2 ppd.  7. Hyponatremia: Improved.  8. Anemia: Normocytic, normochromic. Hgb stable 8.6 Check stools. Continue protonix  9. Hyperglycemia:  10. Abnl UA: Asymptomatic. Afebrile. Nl WBC. Repeat and culture  11. Paroxysmal SVT. Potassium borderline 3.6. Will replete.  Signed, Theodore Demark , PA-C 7:08 AM 01/30/2013 Agree with above assessment. Will decrease Lasix to 80 mg daily. Transfer to telemetry. Ambulate and observe oxygen sats with ambulation.  Prob home Tues.

## 2013-01-30 NOTE — Progress Notes (Signed)
ANTICOAGULATION CONSULT NOTE   Pharmacy Consult for Coumadin Indication: Mechanical AVR/MVR, Afib  No Known Allergies  Labs:  Recent Labs  01/27/13 1401 01/27/13 1953  01/28/13 0119 01/29/13 0451 01/30/13 0445  HGB  --   --   < > 8.3* 8.6* 9.0*  HCT  --   --   --  24.0* 26.1* 27.2*  PLT  --   --   --  333 388 423*  LABPROT  --   --   --  32.9* 33.9* 31.0*  INR  --   --   --  3.37* 3.51* 3.12*  CREATININE  --   --   --  1.19 1.10 1.25  TROPONINI 13.55* 16.54*  --  11.81*  --   --   < > = values in this interval not displayed.  Estimated Creatinine Clearance: 52.7 ml/min (by C-G formula based on Cr of 1.25).   Assessment: 66yom on chronic Coumadin for hx mechanical MVR/AVR and Afib. INR (3.12)  Is within goal range  Goal of Therapy:  INR 2.5-3.5   Plan:  1. Coumadin 7.5mg  po x 1 today 2. Daily INR  Elwin Sleight [phone] 01/30/2013,9:07 AM

## 2013-01-31 ENCOUNTER — Telehealth: Payer: Self-pay | Admitting: *Deleted

## 2013-01-31 DIAGNOSIS — I472 Ventricular tachycardia: Secondary | ICD-10-CM

## 2013-01-31 LAB — BASIC METABOLIC PANEL
Calcium: 8.9 mg/dL (ref 8.4–10.5)
Chloride: 95 mEq/L — ABNORMAL LOW (ref 96–112)
Creatinine, Ser: 1.34 mg/dL (ref 0.50–1.35)
GFR calc Af Amer: 62 mL/min — ABNORMAL LOW (ref >90)

## 2013-01-31 LAB — CBC
MCH: 27.9 pg (ref 26.0–34.0)
MCV: 86.7 fL (ref 78.0–100.0)
Platelets: 408 10*3/uL — ABNORMAL HIGH (ref 150–400)
RDW: 17.6 % — ABNORMAL HIGH (ref 11.5–15.5)
WBC: 7.5 10*3/uL (ref 4.0–10.5)

## 2013-01-31 LAB — MAGNESIUM: Magnesium: 0.9 mg/dL — CL (ref 1.5–2.5)

## 2013-01-31 LAB — PROTIME-INR: Prothrombin Time: 29.2 seconds — ABNORMAL HIGH (ref 11.6–15.2)

## 2013-01-31 MED ORDER — MAGNESIUM SULFATE 40 MG/ML IJ SOLN
2.0000 g | Freq: Once | INTRAMUSCULAR | Status: AC
  Administered 2013-01-31: 2 g via INTRAVENOUS
  Filled 2013-01-31: qty 50

## 2013-01-31 MED ORDER — POTASSIUM CHLORIDE CRYS ER 20 MEQ PO TBCR: 20.0000 meq | EXTENDED_RELEASE_TABLET | Freq: Every day | ORAL | Status: DC

## 2013-01-31 MED ORDER — FUROSEMIDE 40 MG PO TABS: 80.0000 mg | ORAL_TABLET | Freq: Every day | ORAL | Status: DC

## 2013-01-31 MED ORDER — WARFARIN SODIUM 7.5 MG PO TABS
7.5000 mg | ORAL_TABLET | Freq: Once | ORAL | Status: DC
  Filled 2013-01-31: qty 1

## 2013-01-31 MED ORDER — PANTOPRAZOLE SODIUM 40 MG PO TBEC: 40.0000 mg | DELAYED_RELEASE_TABLET | Freq: Every day | ORAL | Status: DC

## 2013-01-31 MED ORDER — MAGNESIUM OXIDE 400 (241.3 MG) MG PO TABS: 400.0000 mg | ORAL_TABLET | Freq: Every day | ORAL | Status: DC

## 2013-01-31 MED ORDER — MAGNESIUM OXIDE 400 (241.3 MG) MG PO TABS
200.0000 mg | ORAL_TABLET | Freq: Every day | ORAL | Status: DC
  Administered 2013-01-31: 200 mg via ORAL
  Filled 2013-01-31: qty 0.5

## 2013-01-31 MED ORDER — NITROGLYCERIN 0.4 MG SL SUBL: 0.4000 mg | SUBLINGUAL_TABLET | SUBLINGUAL | Status: AC | PRN

## 2013-01-31 MED ORDER — METOPROLOL TARTRATE 100 MG PO TABS: 100.0000 mg | ORAL_TABLET | Freq: Two times a day (BID) | ORAL | Status: DC

## 2013-01-31 MED ORDER — LISINOPRIL 20 MG PO TABS: 20.0000 mg | ORAL_TABLET | Freq: Every day | ORAL | Status: DC

## 2013-01-31 NOTE — Progress Notes (Signed)
No response to page to Theodore Demark, PA.  Office repaged with instruction to call back if no response from second page to [employer], PA.  Message relayed ot Signa Kell, RN assumeing care of Mr Kreeger

## 2013-01-31 NOTE — Discharge Summary (Signed)
Patient ID: [name],  MRN: [id number], DOB/AGE: [birth year] 67 y.o.  Admit date: 01/27/2013 Discharge date: 01/31/2013  Primary Care Provider: Fredirick Maudlin Primary Cardiologist: Junius Argyle, MD  Discharge Diagnoses Principal Problem:   Acute on chronic diastolic CHF (congestive heart failure), NYHA class 4  **Net negative diuresis of 7.3 Liters with reduction in weight from 158 lbs on admission to 147 lbs on discharge.  Active Problems:   Arteriosclerotic cardiovascular disease (ASCVD)   Non-ST elevation myocardial infarction (NSTEMI), initial episode of care  **Med Rx.   ANEMIA   TOBACCO ABUSE   Chronic anticoagulation   Fasting hyperglycemia   NSVT (nonsustained ventricular tachycardia)   Hypomagnesemia  **requiring supplementation.   HYPERLIPIDEMIA   HYPONATREMIA, CHRONIC   MITRAL VALVE REPLACEMENT, HX OF   AORTIC VALVE REPLACEMENT, HX OF   Hypertension   Paroxysmal SVT (supraventricular tachycardia)  Allergies No Known Allergies  Procedures  None  History of Present Illness  67 year old male with prior history of coronary artery disease status post coronary artery bypass grafting as well as mechanical mitral valve and aortic valve replacement on chronic Coumadin anticoagulation. He was recently admitted to [facility] in mid July with chest pain and dyspnea and subsequently ruled in for non-ST segment elevation myocardial infarction. In order diagnostic catheterization during that admission revealing severe native right coronary artery disease with OM1 of 4 patent grafts. The native right coronary was successfully stented with a drug-eluting stent. Following discharge, he had been doing well but also notes dietary indiscretions with high sodium intake. He also continues to smoke. His usual state of health until the early morning hours of the day of admission when he awoke suddenly with dyspnea and orthopnea. He called EMS was taken any [facility] where he  required BiPAP and IV Lasix. ProBNP was elevated at 5719, and chest x-ray showed pulmonary edema. EKG initially showed ST segment elevation in leads 3 and aVF which was more pronounced than on discharge ECG although patient complained of no chest pain. With diuresis, patient was able to be taken off of BiPAP and placed on a nonrebreather. Initial troponin was elevated 0.45. Patient was transferred to cone for further evaluation.  Hospital Course  Problem cone, patient was stable. His oxygen requirements will be weaned down to nasal cannula. It was noted that he was up 10 pounds and prior discharge. Repeat ECG shows stabilization of inferior ST segment to prior baseline. Patient did not have any chest pain. He is placed on aggressive IV diuresis and for the admission had a net negative of 7.3 L with reduction in weight from 158 pounds on admission to 147 pounds on discharge.  Of note, troponin did continue to elevate to a peak of 16.54. In the absence of chest pain with resolution of ST changes, was felt that this is more likely secondary to demand ischemia in the setting of diastolic heart failure and known multivessel disease with graft disease as well. Therefore medical therapy was continued and catheterization was not performed.  During admission, patient has been noted to have brief runs of nonsustained ventricular tachycardia as well as supraventricular tachycardia. He has also had hypokalemia requiring supplementation and hypomagnesemia with magnesium is 0.9 this morning.  In this setting, he did receive IV magnesium sulfate this morning and has been placed on oral placement.  To admission he has been followed by pharmacy for dosing of his Coumadin. His INR has remained therapeutic he'll be discharged home on his previous home dose.  Patient has been reinitiated on the importance of dietary and medication compliance, weight monitoring, and symptom reporting. He's also on calcium importance of  smoking cessation and alcohol moderation. He's been ambulating with cardiac rehabilitation without difficulty all be discharged home today in good condition.  Discharge Vitals Blood pressure 108/58, pulse 69, temperature 98.3 F (36.8 C), temperature source Oral, resp. rate 19, height 5\' 6"  (1.676 m), weight 147 lb (66.679 kg), SpO2 95.00%.  Filed Weights   01/30/13 0500 01/30/13 1508 01/31/13 0449  Weight: 147 lb 11.3 oz (67 kg) 148 lb 2.4 oz (67.2 kg) 147 lb (66.679 kg)   Labs  CBC  Recent Labs  01/30/13 0445 01/31/13 0450  WBC 7.9 7.5  HGB 9.0* 8.6*  HCT 27.2* 26.7*  MCV 85.5 86.7  PLT 423* 408*   Basic Metabolic Panel  Recent Labs  01/30/13 0445 01/31/13 0450  NA 132* 135  K 3.9 4.1  CL 92* 95*  CO2 30 30  GLUCOSE 124* 95  BUN 30* 35*  CREATININE 1.25 1.34  CALCIUM 8.9 8.9  MG  --  0.9*   Liver Function Tests Lab Results  Component Value Date   ALT 20 01/27/2013   AST 36 01/27/2013   ALKPHOS 107 01/27/2013   BILITOT 0.3 01/27/2013   Cardiac Enzymes Lab Results  Component Value Date   TROPONINI 11.81* 01/28/2013   Disposition  Pt is being discharged home today in good condition.  Follow-up Plans & Appointments  Follow-up Information   Follow up with Joni Reining, NP On 02/10/2013. (2:40 PM)    Contact information:   [address] [place] [address] [phone]       Follow up with [facility] On 02/08/2013. (coumadin clinic follow-up - 8:30 AM)    Contact information:   [address] [place] [address] [phone]      Follow up with Laqueta Linden, MD On 03/22/2013. (8:20 AM)    Contact information:   [address] [place] [address] [phone]      Discharge Medications    Medication List    STOP taking these medications       omeprazole 20 MG capsule  Commonly known as:  PRILOSEC  Replaced by:  pantoprazole 40 MG tablet      TAKE these medications       acetaminophen 325 MG tablet    Commonly known as:  TYLENOL  Take 2 tablets (650 mg total) by mouth every 6 (six) hours as needed. For pain     amLODipine 10 MG tablet  Commonly known as:  NORVASC  Take 1 tablet (10 mg total) by mouth daily.     atorvastatin 40 MG tablet  Commonly known as:  LIPITOR  Take 40 mg by mouth daily.     clopidogrel 75 MG tablet  Commonly known as:  PLAVIX  Take 1 tablet (75 mg total) by mouth daily with breakfast.     ferrous sulfate 325 (65 FE) MG EC tablet  Take 325 mg by mouth daily with breakfast.     folic acid 1 MG tablet  Commonly known as:  FOLVITE  Take 1 tablet (1 mg total) by mouth daily.     furosemide 40 MG tablet  Commonly known as:  LASIX  Take 2 tablets (80 mg total) by mouth daily.     HYDROcodone-acetaminophen 10-500 MG per tablet  Commonly known as:  LORTAB  Take 1 tablet by mouth 4 times  daily as needed for pain.     levalbuterol 0.63 MG/3ML nebulizer solution  Commonly known as:  XOPENEX  Take 3 mLs (0.63 mg total) by nebulization every 6 (six) hours as needed for wheezing or shortness of breath.     lisinopril 20 MG tablet  Commonly known as:  PRINIVIL,ZESTRIL  Take 1 tablet (20 mg total) by mouth daily.     magnesium oxide 400 (241.3 MG) MG tablet  Commonly known as:  MAG-OX  Take 1 tablet (400 mg total) by mouth daily.     metoprolol 100 MG tablet  Commonly known as:  LOPRESSOR  Take 1 tablet (100 mg total) by mouth 2 (two) times daily.     multivitamin with minerals Tabs  Take 1 tablet by mouth daily.     nitroGLYCERIN 0.4 MG SL tablet  Commonly known as:  NITROSTAT  Place 1 tablet (0.4 mg total) under the tongue every 5 (five) minutes x 3 doses as needed for chest pain.     pantoprazole 40 MG tablet  Commonly known as:  PROTONIX  Take 1 tablet (40 mg total) by mouth daily.     potassium chloride SA 20 MEQ tablet  Commonly known as:  K-DUR,KLOR-CON  Take 1 tablet (20 mEq total) by mouth daily.     thiamine 100 MG tablet  Take 1  tablet (100 mg total) by mouth daily.     warfarin 5 MG tablet  Commonly known as:  COUMADIN  Take 1.5 tablets (7.5 mg total) by mouth daily. [facility] to [facility] for INR on Wed, 01/25/2013 at 9:20 AM.       Outstanding Labs/Studies  Follow-up Magnesium level @ f/u appt.  Duration of Discharge Encounter   Greater than 30 minutes including physician time.  Signed, Nicolasa Ducking NP 01/31/2013, 3:10 PM

## 2013-01-31 NOTE — Progress Notes (Signed)
CMT notified RN of 7 beat run Vtach at  2355 hr 01/30/13.  Nonsustained, self resolved, pt asleep at the time.  Pt denies chest pain, VSS.  Dr Shirlee Latch notified of same. Labs ordered for AM.  Will continue to monitor pt

## 2013-01-31 NOTE — Progress Notes (Signed)
Subjective:  Feels better. Tolerating activity. No chest pain. Had run of monomorphic VT at midnight. Magnesium level 0.9  Objective:  Vital Signs in the last 24 hours: Temp:  [97.6 F (36.4 C)-98.3 F (36.8 C)] 98.3 F (36.8 C) (07/29 0449) Pulse Rate:  [66-84] 69 (07/29 0449) Resp:  [18-23] 19 (07/29 0449) BP: (111-136)/(47-79) 136/56 mmHg (07/29 0449) SpO2:  [91 %-98 %] 95 % (07/29 0711) Weight:  [147 lb (66.679 kg)-148 lb 2.4 oz (67.2 kg)] 147 lb (66.679 kg) (07/29 0449)  Intake/Output from previous day: 07/28 0701 - 07/29 0700 In: 970 [P.O.:970] Out: 675 [Urine:675] Intake/Output from this shift:    . albuterol  2.5 mg Nebulization BID  . amLODipine  10 mg Oral Daily  . atorvastatin  40 mg Oral Daily  . clopidogrel  75 mg Oral Q breakfast  . ferrous sulfate  325 mg Oral Q breakfast  . folic acid  1 mg Oral Daily  . furosemide  80 mg Oral Daily  . ipratropium  0.5 mg Nebulization BID  . lisinopril  20 mg Oral Daily  . magnesium oxide  200 mg Oral Daily  . magnesium sulfate 1 - 4 g bolus IVPB  2 g Intravenous Once  . metoprolol  100 mg Oral BID  . multivitamin with minerals  1 tablet Oral Daily  . nitroGLYCERIN  1 inch Topical Q6H  . pantoprazole  40 mg Oral Daily  . potassium chloride  20 mEq Oral Daily  . sodium chloride  3 mL Intravenous Q12H  . thiamine  100 mg Oral Daily  . Warfarin - Pharmacist Dosing Inpatient   Does not apply q1800      Physical Exam: The patient appears to be in no distress.  Head and neck exam reveals that the pupils are equal and reactive.  The extraocular movements are full.  There is no scleral icterus.  Mouth and pharynx are benign.  No lymphadenopathy.  No carotid bruits.  The jugular venous pressure is normal.  Thyroid is not enlarged or tender.  Chest is clear to percussion and auscultation.  No rales or rhonchi.  Expansion of the chest is symmetrical.  Heart reveals good prosthetic valve clicks.  The abdomen is soft and  nontender.  Bowel sounds are normoactive.  There is no hepatosplenomegaly or mass.  There are no abdominal bruits.  Extremities reveal no phlebitis or edema.  Pedal pulses are good.  There is no cyanosis or clubbing.  Neurologic exam is normal strength and no lateralizing weakness.  No sensory deficits.  Integument reveals no rash  Lab Results:  Recent Labs  01/30/13 0445 01/31/13 0450  WBC 7.9 7.5  HGB 9.0* 8.6*  PLT 423* 408*    Recent Labs  01/30/13 0445 01/31/13 0450  NA 132* 135  K 3.9 4.1  CL 92* 95*  CO2 30 30  GLUCOSE 124* 95  BUN 30* 35*  CREATININE 1.25 1.34   No results found for this basename: TROPONINI, CK, MB,  in the last 72 hours Hepatic Function Panel No results found for this basename: PROT, ALBUMIN, AST, ALT, ALKPHOS, BILITOT, BILIDIR, IBILI,  in the last 72 hours No results found for this basename: CHOL,  in the last 72 hours No results found for this basename: PROTIME,  in the last 72 hours  Imaging: Imaging results have been reviewed  Cardiac Studies: EKG shows no evolutionary changes. Assessment/Plan:  1. Acute on chronic diastolic congestive heart failure: Improved. Now on oral lasix.  2. Coronary artery disease: Patient status post stenting of the native right coronary artery. He has one of 4 patent grafts (LIMA to the LAD). His ECG is markedly abnl though with slightly more pronounced ST/T changes than previously. Repeat is more like previous baseline ecg. His troponin is mildly elevated.  Troponins trending down. He is not on aspirin secondary to concomitant Plavix and Coumadin therapy. Cont statin, bb, acei.  3. S/P AVR/MVR: Continue coumadin. 4. HTN: Stable.  5. HL: F/u. Last checked in 2012. LFT's nl.  6. Tob/ETOH Abuse: Cessation advised. He is still smoking 1/2 ppd.  7. Hyponatremia: Improved. 8. Anemia: Normocytic, normochromic. Hgb stable 8.6 Check stools. Continue protonix 9. Hyperglycemia:  10. Abnl UA: Asymptomatic. Afebrile.  Nl WBC. Repeat and culture 11. Monomorphic VT with hypomagnesemia. Prior history of heavy alcohol intake.  Plan: will give supplemental magnesium. Okay for discharge later today. Will need followup serum magnesium as out-patient.   LOS: 4 days    Cassell Clement 01/31/2013, 8:31 AM

## 2013-01-31 NOTE — Progress Notes (Signed)
Reviewed ed with HF, daily wts, low sodium, daily ex and CRPII. Still not interested in CRPII. Sts he will work on ex and diet but does not sound convincing. Reluctant to discuss smoking cessation (family was present). Left 1800 quit now resource. [id number] Ethelda Chick CES, ACSM 12:26 PM 01/31/2013

## 2013-01-31 NOTE — Progress Notes (Addendum)
0830 seen by Dr. Patty Sermons  Aware of mag level today and runs of V- tach from last night . With orders

## 2013-01-31 NOTE — Telephone Encounter (Signed)
7 DAY TCM 

## 2013-01-31 NOTE — Progress Notes (Signed)
ANTICOAGULATION CONSULT NOTE   Pharmacy Consult for Coumadin Indication: Mechanical AVR/MVR, Afib  No Known Allergies  Labs:  Recent Labs  01/29/13 0451 01/30/13 0445 01/31/13 0450  HGB 8.6* 9.0* 8.6*  HCT 26.1* 27.2* 26.7*  PLT 388 423* 408*  LABPROT 33.9* 31.0* 29.2*  INR 3.51* 3.12* 2.89*  CREATININE 1.10 1.25 1.34    Estimated Creatinine Clearance: 48.3 ml/min (by C-G formula based on Cr of 1.34).   Assessment: 66yom on chronic Coumadin for hx mechanical MVR/AVR and Afib. INR (2.89)  Is within goal range, cbc stable,no bleeding noted Continue home Couamdin dose 7.5mg  daily  Goal of Therapy:  INR 2.5-3.5   Plan:  1. Coumadin 7.5mg  po x 1 today 2. Daily INR  Leota Sauers Pharm.D. CPP, BCPS Clinical Pharmacist [phone] 01/31/2013 11:41 AM

## 2013-01-31 NOTE — Progress Notes (Signed)
Notified by lab of Magnesium 0.9.   Results paged to Theodore Demark, PA

## 2013-02-03 NOTE — Telephone Encounter (Signed)
Patient contacted regarding discharge from [facility] ON 01-27-13.  Patient understands to follow up with provider KL NP on 02-10-13 at 2:240AM at [facility] OFFICE.(ALSO COUMADIN APT WITH LR ON 02-08-13 AT 8:30AM) Patient understands discharge instructions?  Patient understands medications and regiment?  Patient understands to bring all medications to this visit?  No answer/vm capability noted, will try to call back again

## 2013-02-06 NOTE — Telephone Encounter (Signed)
No answer, nor vm capability noted

## 2013-02-08 ENCOUNTER — Ambulatory Visit (INDEPENDENT_AMBULATORY_CARE_PROVIDER_SITE_OTHER): Payer: Medicare Other | Admitting: *Deleted

## 2013-02-08 DIAGNOSIS — Z9889 Other specified postprocedural states: Secondary | ICD-10-CM

## 2013-02-08 DIAGNOSIS — Z7901 Long term (current) use of anticoagulants: Secondary | ICD-10-CM

## 2013-02-08 DIAGNOSIS — Z954 Presence of other heart-valve replacement: Secondary | ICD-10-CM

## 2013-02-10 ENCOUNTER — Ambulatory Visit (INDEPENDENT_AMBULATORY_CARE_PROVIDER_SITE_OTHER): Payer: Medicare Other | Admitting: Adult Health

## 2013-02-10 ENCOUNTER — Encounter: Payer: Self-pay | Admitting: Adult Health

## 2013-02-10 VITALS — BP 113/59 | HR 65 | Ht 66.0 in | Wt 155.0 lb

## 2013-02-10 DIAGNOSIS — I214 Non-ST elevation (NSTEMI) myocardial infarction: Secondary | ICD-10-CM

## 2013-02-10 DIAGNOSIS — I509 Heart failure, unspecified: Secondary | ICD-10-CM

## 2013-02-10 DIAGNOSIS — Z954 Presence of other heart-valve replacement: Secondary | ICD-10-CM

## 2013-02-10 DIAGNOSIS — I5033 Acute on chronic diastolic (congestive) heart failure: Secondary | ICD-10-CM

## 2013-02-10 NOTE — Patient Instructions (Signed)
Your physician recommends that you schedule a follow-up appointment in: on 03-22-13 at 8:20am with Dr Purvis Sheffield  Your physician recommends that you continue on your current medications as directed. Please refer to the Current Medication list given to you today.

## 2013-02-10 NOTE — Progress Notes (Signed)
HPI: [name] is a 67 year old patient of [name] wall we are following for ongoing assessment and management of CAD, atrial fibrillation on chronic Coumadin therapy, and to mechanical valves. Patient has not DES to the right coronary artery is also being treated with Plavix but not aspirin. Patient was last seen in the office on 01/26/2013 found to be stable.   Unfortunately, the patient was admitted to [name] in the setting of acute on chronic diastolic CHF on 01/27/2013. He complains of chest pain and dyspnea. Non-ST elevation MI was diagnosed with elevated troponin of 16.54. This was believed to be related to demand ischemia in the setting of diastolic heart failure. During admission she was found to have brief runs of nonsustained ventricular tachycardia as well as supraventricular tachycardia in the setting of hypokalemia. Weight was up 10 pounds. Weight on discharge is 147 pounds. Was given instruction on dietary and medical compliance, weight monitoring and smoking cessation.    He is doing well. He is avoiding salt, weighing daily and medically compliant. He offers no complaints of chest pain or dyspnea. He is down to 1/2 ppd of cigarettes. He is working on quitting completley.    No Known Allergies  Current Outpatient Prescriptions  Medication Sig Dispense Refill  . acetaminophen (TYLENOL) 325 MG tablet Take 2 tablets (650 mg total) by mouth every 6 (six) hours as needed. For pain  30 tablet  0  . amLODipine (NORVASC) 10 MG tablet Take 1 tablet (10 mg total) by mouth daily.      Marland Kitchen atorvastatin (LIPITOR) 40 MG tablet Take 40 mg by mouth daily.      . clopidogrel (PLAVIX) 75 MG tablet Take 1 tablet (75 mg total) by mouth daily with breakfast.  30 tablet  4  . ferrous sulfate 325 (65 FE) MG EC tablet Take 325 mg by mouth daily with breakfast.      . folic acid (FOLVITE) 1 MG tablet Take 1 tablet (1 mg total) by mouth daily.  30 tablet  4  . furosemide (LASIX) 40 MG tablet Take 2  tablets (80 mg total) by mouth daily.  60 tablet  6  . HYDROcodone-acetaminophen (LORTAB) 10-500 MG per tablet Take 1 tablet by mouth 4 times daily as needed for pain.       Marland Kitchen levalbuterol (XOPENEX) 0.63 MG/3ML nebulizer solution Take 3 mLs (0.63 mg total) by nebulization every 6 (six) hours as needed for wheezing or shortness of breath.  3 mL  4  . magnesium oxide (MAG-OX) 400 (241.3 MG) MG tablet Take 1 tablet (400 mg total) by mouth daily.  30 tablet  6  . Multiple Vitamin (MULTIVITAMIN WITH MINERALS) TABS Take 1 tablet by mouth daily.      . nitroGLYCERIN (NITROSTAT) 0.4 MG SL tablet Place 1 tablet (0.4 mg total) under the tongue every 5 (five) minutes x 3 doses as needed for chest pain.  25 tablet  3  . pantoprazole (PROTONIX) 40 MG tablet Take 1 tablet (40 mg total) by mouth daily.  30 tablet  6  . potassium chloride SA (K-DUR,KLOR-CON) 20 MEQ tablet Take 1 tablet (20 mEq total) by mouth daily.  30 tablet  6  . thiamine 100 MG tablet Take 1 tablet (100 mg total) by mouth daily.  30 tablet  4  . lisinopril (PRINIVIL,ZESTRIL) 20 MG tablet Take 1 tablet (20 mg total) by mouth daily.  30 tablet  6  . metoprolol (LOPRESSOR) 100 MG tablet Take 1  tablet (100 mg total) by mouth 2 (two) times daily.  60 tablet  6  . warfarin (COUMADIN) 5 MG tablet Take 7.5 mg by mouth daily. Take 1 tablet daily except 1 1/2 tablets on M,Th       No current facility-administered medications for this visit.    Past Medical History  Diagnosis Date  . Hypertension   . CAD (coronary artery disease) 2003    a. CABG/MVR/AVR-2003 (#25 St. Jude/#21 St. Jude); anticoagulation; b. negative stress nuclear-2005;  c. 01/2013 NSTEMI/Cath/PCI: LM nl, LAD 50p, 40-44m, D1 50ost, LCX 66m, RCA 50/90m (2.5x20 Promus DES), 50d, VG->Diag 100, VG->PDA 100, VG->OM 100, LIMA->LAD ok.  . Epistaxis     requiring cautery & aterial ligation-09/2009  . GERD (gastroesophageal reflux disease)   . Hyperlipemia   . Abnormal LFTs     possible  cirrhosis  . Chronic anticoagulation   . Valvular heart disease     a. 2003: MVR/AVR-2003 (#25 St. Jude/#21 St. Jude);  b. 01/2013 Echo: EF 55%, Mech AVR mean grad 12, Mech MVR mean grad 6.  . Tobacco abuse     45 pack years  . Fasting hyperglycemia   . Nephrolithiasis   . Diverticulosis   . Hyponatremia   . Chronic diastolic CHF (congestive heart failure)   . Chronic renal disease   . Hemolytic anemia     history of  . ETOH abuse   . A-fib 01/16/2013    a. on coumadin  . COPD (chronic obstructive pulmonary disease)     CONTROLED    Past Surgical History  Procedure Laterality Date  . Endocopic sphenopalatine artery ligation & cautry    . Inguinal hernia repair      Left & right  . Coronary artery bypass graft  11/2001    [facility]  . Cardiac valve replacement  11/2001    AVR and MVR-St. Jude devices  . Cataract extraction w/phaco  01/20/2011    Procedure: CATARACT EXTRACTION PHACO AND INTRAOCULAR LENS PLACEMENT (IOC);  Surgeon: Loraine Leriche T. Nile Riggs;  Location: [facility];  Service: Ophthalmology;  Laterality: Right;  CDE: 8.51  . Cataract extraction w/phaco  02/03/2011    Procedure: CATARACT EXTRACTION PHACO AND INTRAOCULAR LENS PLACEMENT (IOC);  Surgeon: Loraine Leriche T. Nile Riggs;  Location: [facility];  Service: Ophthalmology;  Laterality: Left;  CDE:10.01  . Colonoscopy  04/05/02; 08/2011    friable anal canal hemorrhoids otherwise normal; 2 diminutive polyps excised, minimal diverticulosis noted  . Esophagogastroduodenoscopy  01/2002    Dr. Karilyn Cota, submucosal esophageal lesion c/w leiomyoma  . Colonoscopy  09/02/2011    Procedure: COLONOSCOPY;  Surgeon: Corbin Ade, MD;  Location: [facility] SUITE;  Service: Endoscopy;  Laterality: N/A;  8:15    ROS: Review of systems complete and found to be negative unless listed above  PHYSICAL EXAM BP 113/59  Pulse 65  Ht 5\' 6"  (1.676 m)  Wt 155 lb (70.308 kg)  BMI 25.03 kg/m2  General: Well developed, well nourished, in no acute distress Head:  Eyes PERRLA, No xanthomas.   Normal cephalic and atramatic  Lungs: Clear bilaterally no wheezes or crackles. Heart: HRRR S1 S2, 1/6 systolic murmur at the LSB.  Pulses are 2+ & equal.            No carotid bruit. No JVD.  No abdominal bruits. No femoral bruits. Abdomen: Bowel sounds are positive, abdomen soft and non-tender without masses or  Hernia's noted. Msk:  Back normal, normal gait. Normal strength and tone for age. Extremities: No clubbing, cyanosis or edema.  DP +1 Neuro: Alert and oriented X 3. Psych:  Good affect, responds appropriately  EKG: NSR with ICRBBB. Inferior Q-waves are noted.  ASSESSMENT AND PLAN

## 2013-02-10 NOTE — Progress Notes (Deleted)
Name: [name]    DOB: 1946-01-09  Age: 67 y.o.  MR#: [id number]       PCP:  Fredirick Maudlin, MD      Insurance: Payor: [employer] MEDICARE / Plan: AARP MEDICARE COMPLETE / Product Type: *No Product type* /   CC:    Chief Complaint  Patient presents with  . Coronary Artery Disease  . Atrial Fibrillation    VS Filed Vitals:   02/10/13 1450  BP: 113/59  Pulse: 65  Height: 5\' 6"  (1.676 m)  Weight: 155 lb (70.308 kg)    Weights Current Weight  02/10/13 155 lb (70.308 kg)  01/31/13 147 lb (66.679 kg)  01/26/13 160 lb 8 oz (72.802 kg)    Blood Pressure  BP Readings from Last 3 Encounters:  02/10/13 113/59  01/31/13 108/58  01/26/13 121/61     Admit date:  (Not on file) Last encounter with RMR:  Visit date not found   Allergy Review of patient's allergies indicates no known allergies.  Current Outpatient Prescriptions  Medication Sig Dispense Refill  . acetaminophen (TYLENOL) 325 MG tablet Take 2 tablets (650 mg total) by mouth every 6 (six) hours as needed. For pain  30 tablet  0  . amLODipine (NORVASC) 10 MG tablet Take 1 tablet (10 mg total) by mouth daily.      Marland Kitchen atorvastatin (LIPITOR) 40 MG tablet Take 40 mg by mouth daily.      . clopidogrel (PLAVIX) 75 MG tablet Take 1 tablet (75 mg total) by mouth daily with breakfast.  30 tablet  4  . ferrous sulfate 325 (65 FE) MG EC tablet Take 325 mg by mouth daily with breakfast.      . folic acid (FOLVITE) 1 MG tablet Take 1 tablet (1 mg total) by mouth daily.  30 tablet  4  . furosemide (LASIX) 40 MG tablet Take 2 tablets (80 mg total) by mouth daily.  60 tablet  6  . HYDROcodone-acetaminophen (LORTAB) 10-500 MG per tablet Take 1 tablet by mouth 4 times daily as needed for pain.       Marland Kitchen levalbuterol (XOPENEX) 0.63 MG/3ML nebulizer solution Take 3 mLs (0.63 mg total) by nebulization every 6 (six) hours as needed for wheezing or shortness of breath.  3 mL  4  . magnesium oxide (MAG-OX) 400 (241.3 MG) MG tablet Take  1 tablet (400 mg total) by mouth daily.  30 tablet  6  . Multiple Vitamin (MULTIVITAMIN WITH MINERALS) TABS Take 1 tablet by mouth daily.      . nitroGLYCERIN (NITROSTAT) 0.4 MG SL tablet Place 1 tablet (0.4 mg total) under the tongue every 5 (five) minutes x 3 doses as needed for chest pain.  25 tablet  3  . pantoprazole (PROTONIX) 40 MG tablet Take 1 tablet (40 mg total) by mouth daily.  30 tablet  6  . potassium chloride SA (K-DUR,KLOR-CON) 20 MEQ tablet Take 1 tablet (20 mEq total) by mouth daily.  30 tablet  6  . thiamine 100 MG tablet Take 1 tablet (100 mg total) by mouth daily.  30 tablet  4  . lisinopril (PRINIVIL,ZESTRIL) 20 MG tablet Take 1 tablet (20 mg total) by mouth daily.  30 tablet  6  . metoprolol (LOPRESSOR) 100 MG tablet Take 1 tablet (100 mg total) by mouth 2 (two) times daily.  60 tablet  6  . warfarin (COUMADIN) 5 MG tablet Take 7.5 mg by mouth daily. Take 1 tablet daily except 1  1/2 tablets on M,Th       No current facility-administered medications for this visit.    Discontinued Meds:   There are no discontinued medications.  Patient Active Problem List   Diagnosis Date Noted  . NSVT (nonsustained ventricular tachycardia) 01/31/2013  . Hypomagnesemia 01/31/2013  . Paroxysmal SVT (supraventricular tachycardia) 01/30/2013  . Abnormal urinalysis 01/30/2013  . Acute on chronic diastolic CHF (congestive heart failure), NYHA class 4 01/30/2013  . Non-ST elevation myocardial infarction (NSTEMI), initial episode of care 01/30/2013  . Non-sustained ventricular tachycardia 01/17/2013  . Acute respiratory failure with hypoxia 01/17/2013  . Atrial fibrillation with RVR 01/16/2013  . Chest pain 01/16/2013  . NSTEMI (non-ST elevated myocardial infarction) 01/16/2013  . Arteriosclerotic cardiovascular disease (ASCVD)   . Hypertension   . Epistaxis   . GERD (gastroesophageal reflux disease)   . Abnormal LFTs   . Fasting hyperglycemia   . Nephrolithiasis   . Chronic  anticoagulation 12/22/2010  . HYPONATREMIA, CHRONIC 04/06/2010  . HYPERLIPIDEMIA 02/20/2010  . ANEMIA 02/20/2010  . TOBACCO ABUSE 02/20/2010  . CHRONIC OBSTRUCTIVE PULMONARY DISEASE 02/20/2010  . DIVERTICULOSIS, COLON 02/20/2010  . MITRAL VALVE REPLACEMENT, HX OF 02/20/2010  . AORTIC VALVE REPLACEMENT, HX OF 02/20/2010    LABS    Component Value Date/Time   NA 135 01/31/2013 0450   NA 132* 01/30/2013 0445   NA 133* 01/29/2013 0451   K 4.1 01/31/2013 0450   K 3.9 01/30/2013 0445   K 3.6 01/29/2013 0451   CL 95* 01/31/2013 0450   CL 92* 01/30/2013 0445   CL 93* 01/29/2013 0451   CO2 30 01/31/2013 0450   CO2 30 01/30/2013 0445   CO2 33* 01/29/2013 0451   GLUCOSE 95 01/31/2013 0450   GLUCOSE 124* 01/30/2013 0445   GLUCOSE 102* 01/29/2013 0451   BUN 35* 01/31/2013 0450   BUN 30* 01/30/2013 0445   BUN 26* 01/29/2013 0451   CREATININE 1.34 01/31/2013 0450   CREATININE 1.25 01/30/2013 0445   CREATININE 1.10 01/29/2013 0451   CREATININE 0.84 05/18/2011 0710   CALCIUM 8.9 01/31/2013 0450   CALCIUM 8.9 01/30/2013 0445   CALCIUM 8.5 01/29/2013 0451   GFRNONAA 53* 01/31/2013 0450   GFRNONAA 58* 01/30/2013 0445   GFRNONAA 68* 01/29/2013 0451   GFRAA 62* 01/31/2013 0450   GFRAA 67* 01/30/2013 0445   GFRAA 78* 01/29/2013 0451   CMP     Component Value Date/Time   NA 135 01/31/2013 0450   K 4.1 01/31/2013 0450   CL 95* 01/31/2013 0450   CO2 30 01/31/2013 0450   GLUCOSE 95 01/31/2013 0450   BUN 35* 01/31/2013 0450   CREATININE 1.34 01/31/2013 0450   CREATININE 0.84 05/18/2011 0710   CALCIUM 8.9 01/31/2013 0450   PROT 7.6 01/27/2013 0705   ALBUMIN 3.4* 01/27/2013 0705   AST 36 01/27/2013 0705   ALT 20 01/27/2013 0705   ALKPHOS 107 01/27/2013 0705   BILITOT 0.3 01/27/2013 0705   GFRNONAA 53* 01/31/2013 0450   GFRAA 62* 01/31/2013 0450       Component Value Date/Time   WBC 7.5 01/31/2013 0450   WBC 7.9 01/30/2013 0445   WBC 8.9 01/29/2013 0451   HGB 8.6* 01/31/2013 0450   HGB 9.0* 01/30/2013 0445   HGB 8.6*  01/29/2013 0451   HCT 26.7* 01/31/2013 0450   HCT 27.2* 01/30/2013 0445   HCT 26.1* 01/29/2013 0451   MCV 86.7 01/31/2013 0450   MCV 85.5 01/30/2013 0445   MCV 85.0 01/29/2013 0451  Lipid Panel     Component Value Date/Time   CHOL 147 01/28/2013 0119   TRIG 47 01/28/2013 0119   HDL 64 01/28/2013 0119   CHOLHDL 2.3 01/28/2013 0119   VLDL 9 01/28/2013 0119   LDLCALC 74 01/28/2013 0119   LDLCALC 62 05/17/2008    ABG    Component Value Date/Time   TCO2 23 08/01/2009 1327     No results found for this basename: TSH   BNP (last 3 results)  Recent Labs  01/16/13 0656 01/20/13 0410 01/27/13 0705  PROBNP 2446.0* 6499.0* 5719.0*   Cardiac Panel (last 3 results) No results found for this basename: CKTOTAL, CKMB, TROPONINI, RELINDX,  in the last 72 hours  Iron/TIBC/Ferritin    Component Value Date/Time   IRON 171* 07/24/2009 0552   TIBC 356 07/24/2009 0552   FERRITIN 224 07/24/2009 0552     EKG Orders placed during the hospital encounter of 01/27/13  . ED EKG  . EKG 12-LEAD  . EKG 12-LEAD  . EKG 12-LEAD  . EKG 12-LEAD  . EKG 12-LEAD  . EKG 12-LEAD  . EKG 12-LEAD  . EKG 12-LEAD  . EKG 12-LEAD  . EKG 12-LEAD  . EKG 12-LEAD  . EKG     Prior Assessment and Plan Problem List as of 02/10/2013     Cardiovascular and Mediastinum   Arteriosclerotic cardiovascular disease (ASCVD)   Last Assessment & Plan   01/26/2013 Office Visit Written 01/26/2013  2:57 PM by Gaylord Shih, MD     Stable after drug-eluting stent to the right coronary artery. No change in medical therapy.    Hypertension   Last Assessment & Plan   03/22/2012 Office Visit Written 03/22/2012  1:32 PM by Jodelle Gross, NP     After control of blood pressure on current medication regimen. He will continue metoprolol, lisinopril, and amlodipine. Followup labs will be drawn next week per Dr. Juanetta Gosling office. Will receive copies of same were reviewed in the chart.    Epistaxis   Last Assessment & Plan   03/19/2011  Office Visit Written 03/19/2011 12:49 PM by Kathlen Brunswick, MD     No recurrent problems since surgery undertaken 18 months ago.    Atrial fibrillation with RVR   Last Assessment & Plan   01/26/2013 Office Visit Written 01/26/2013  2:58 PM by Gaylord Shih, MD     Ventricular rate well controlled. No change in medical therapy.    NSTEMI (non-ST elevated myocardial infarction)   Non-sustained ventricular tachycardia   Paroxysmal SVT (supraventricular tachycardia)   Acute on chronic diastolic CHF (congestive heart failure), NYHA class 4   Non-ST elevation myocardial infarction (NSTEMI), initial episode of care   NSVT (nonsustained ventricular tachycardia)     Respiratory   CHRONIC OBSTRUCTIVE PULMONARY DISEASE   Last Assessment & Plan   03/19/2011 Office Visit Written 03/19/2011 12:48 PM by Kathlen Brunswick, MD     Unfortunately, patient is not inclined to attempt to discontinue cigarette smoking.    Acute respiratory failure with hypoxia     Digestive   DIVERTICULOSIS, COLON   GERD (gastroesophageal reflux disease)     Endocrine   Fasting hyperglycemia   Last Assessment & Plan   03/19/2011 Office Visit Written 03/19/2011 12:49 PM by Kathlen Brunswick, MD     Repeat FBS will be obtained.      Genitourinary   Nephrolithiasis     Other   HYPERLIPIDEMIA   Last Assessment & Plan  03/22/2012 Office Visit Written 03/22/2012  1:33 PM by Jodelle Gross, NP     I have advised him smoking cessation would help with cholesterol status. He will have followup labs completed by Dr. Juanetta Gosling next week. He verbalizes understanding.    HYPONATREMIA, CHRONIC   Last Assessment & Plan   03/19/2011 Office Visit Written 03/19/2011 12:52 PM by Kathlen Brunswick, MD     Electrolytes will be reassessed.    ANEMIA   Last Assessment & Plan   08/10/2011 Office Visit Written 08/10/2011 12:32 PM by Tiffany Kocher, PA     Chronic anemia. Heme neg X 3 in 05/2011, Dr. Dietrich Pates. 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  Colonoscopy in near future.  I have discussed the risks, alternatives, benefits with regards to but not limited to the risk of reaction to medication, bleeding, infection, perforation and the patient is agreeable to proceed. Written consent to be obtained.     TOBACCO ABUSE   MITRAL VALVE REPLACEMENT, HX OF   AORTIC VALVE REPLACEMENT, HX OF   Last Assessment & Plan   03/22/2012 Office Visit Written 03/22/2012  1:32 PM by Jodelle Gross, NP     Good crisp click is noted on exam. He will remain on Coumadin and instructed for dosing adjustments per INR. He offers no complaints of bleeding issues. He did have some epistaxis in the past but this is no longer a problem for him. Followup CBC should be drawn by Dr. Juanetta Gosling for continued assessment.    Chronic anticoagulation   Last Assessment & Plan   08/10/2011 Office Visit Edited 08/10/2011 12:47 PM by Tiffany Kocher, PA     Will likely need Lovenox bridge. Sent question to Dr. Marvel Plan nurse, Tomi Cheek. Await response.     Abnormal LFTs   Last Assessment & Plan   08/10/2011 Office Visit Edited 08/10/2011 12:42 PM by Tiffany Kocher, PA     Chronically elevated alk phos in setting of daily etoh use. Need to rule out PBC. No cirrhosis on abd u/s 2007. No splenomegaly on exam. Platelet counts are normal. Cannot rule out underlying cirrhosis. Consider updating abd u/s if patient agreeable.   Will also check AMA, GGT.   If patient has cirrhosis, would advise egd to r/o varices, which could be done at time of tcs.    Chest pain   Abnormal urinalysis   Hypomagnesemia       Imaging: Dg Chest Port 1 View  01/27/2013   *RADIOLOGY REPORT*  Clinical Data: Short of breath.  PORTABLE CHEST - 1 VIEW  Comparison: 01/18/2013.  Findings: Median sternotomy with cardiac valve replacement. There is diffuse bilateral left greater than right basilar predominant airspace disease most compatible with cardiogenic pulmonary edema. The heart is enlarged.  Superimposed  pneumonia is difficult to exclude but not favored. Monitoring leads are projected over the chest.  Mediastinal contours appear unchanged with aortic arch atherosclerosis.  IMPRESSION: Moderate CHF with left greater than right pulmonary edema.  The airspace disease is most compatible with edema however superimposed infection cannot be excluded radiographically.   Original Report Authenticated By: Andreas Newport, M.D.   Dg Chest Port 1 View  01/18/2013   *RADIOLOGY REPORT*  Clinical Data: Progressive hypoxia.  Evaluate for potential congestive heart failure.  PORTABLE CHEST - 1 VIEW  Comparison: Chest x-ray 01/16/2013.  Findings: Compared to the prior examination in multifocal interstitial and airspace disease has slightly improved compared to the prior study.  There continues to be  some cephalization of the pulmonary vasculature.  Potential trace bilateral pleural effusions.  Heart size is mildly enlarged.  Upper mediastinal contours are within normal limits.  Atherosclerosis in the thoracic aorta.  Status post median sternotomy for CABG, as well as aortic and mitral valve replacement.  IMPRESSION: 1.  Improving aeration throughout the lungs bilaterally.  This is favored to represent resolving pulmonary edema, however, some component of superimposed infection is not excluded. 2.  Mild cardiomegaly is unchanged. 3.  Atherosclerosis. 4.  Postoperative changes, as above.   Original Report Authenticated By: Trudie Reed, M.D.   Dg Chest Port 1 View  01/16/2013   *RADIOLOGY REPORT*  Clinical Data: Shortness of breath.  PORTABLE CHEST - 1 VIEW  Comparison: Chest radiograph from 05/25/2011  Findings: The lungs are well expanded.  Bilateral airspace opacification is noted, particularly at the lower lung zones.  This may reflect pneumonia or pulmonary edema.  Small bilateral pleural effusions are seen.  No pneumothorax is seen.  Underlying vascular congestion is noted.  The cardiomediastinal silhouette is borderline  normal in size.  The patient is status post median sternotomy.  A mitral valve replacement is noted.  Calcification is noted in the aortic arch. No acute osseous abnormalities are seen.  IMPRESSION: Bilateral airspace opacification, particularly at the lower lung zones.  This may reflect pneumonia or pulmonary edema.  Underlying vascular congestion noted.  Small bilateral pleural effusions seen.   Original Report Authenticated By: Tonia Ghent, M.D.   Dg Chest Port 1v Same Day  01/29/2013   *RADIOLOGY REPORT*  Clinical Data:  Congestive heart failure  PORTABLE CHEST - 1 VIEW  Comparison: January 27, 2013  Findings:  There is a degree of underlying emphysema.  There has been partial but incomplete clearing of edema compared to recent prior study.  There is no no airspace consolidation currently. Cardiomegaly persists.  The pulmonary vascularity is consistent with underlying emphysema.  No adenopathy.  The patient is status post mitral valve replacement.  IMPRESSION: Evidence of congestive heart failure superimposed on emphysematous change.  There is less edema compared to recent prior study. Currently no air space consolidations or effusions.  No new opacity.   Original Report Authenticated By: Bretta Bang, M.D.

## 2013-02-10 NOTE — Assessment & Plan Note (Signed)
He continues on coumadin therapy. He will follow up in coumadin clinic for ongoing assessment of INR and dosing.

## 2013-02-10 NOTE — Assessment & Plan Note (Signed)
He is weighing himself daily, he is avoiding salt and walking daily. Wts have been stable at 146-148 lbs. He is taking control of his health and making a good effort at quitting smoking. He is congratulated and encouraged to continue this lifestyle change. He will follow up with Dr. Beulah Gandy at previously scheduled appt.

## 2013-02-10 NOTE — Addendum Note (Signed)
Addended by: Thompson Grayer on: 02/10/2013 04:03 PM   Modules accepted: Orders

## 2013-02-10 NOTE — Assessment & Plan Note (Signed)
He is without cardiac complaint. He is medically compliant. He will continue with risk factor management with close follow up. No changes in his medications regimen.

## 2013-02-20 ENCOUNTER — Ambulatory Visit (INDEPENDENT_AMBULATORY_CARE_PROVIDER_SITE_OTHER): Payer: Medicare Other | Admitting: *Deleted

## 2013-02-20 DIAGNOSIS — Z7901 Long term (current) use of anticoagulants: Secondary | ICD-10-CM

## 2013-02-20 DIAGNOSIS — Z9889 Other specified postprocedural states: Secondary | ICD-10-CM

## 2013-02-20 DIAGNOSIS — Z954 Presence of other heart-valve replacement: Secondary | ICD-10-CM

## 2013-03-13 ENCOUNTER — Ambulatory Visit (INDEPENDENT_AMBULATORY_CARE_PROVIDER_SITE_OTHER): Payer: Medicare Other | Admitting: *Deleted

## 2013-03-13 DIAGNOSIS — Z9889 Other specified postprocedural states: Secondary | ICD-10-CM

## 2013-03-13 DIAGNOSIS — Z954 Presence of other heart-valve replacement: Secondary | ICD-10-CM

## 2013-03-13 DIAGNOSIS — Z7901 Long term (current) use of anticoagulants: Secondary | ICD-10-CM

## 2013-03-13 LAB — POCT INR: INR: 3.2

## 2013-03-17 ENCOUNTER — Other Ambulatory Visit: Payer: Self-pay | Admitting: Cardiology

## 2013-03-17 MED ORDER — METOPROLOL TARTRATE 100 MG PO TABS: 100.0000 mg | ORAL_TABLET | Freq: Two times a day (BID) | ORAL | Status: DC

## 2013-03-17 MED ORDER — WARFARIN SODIUM 5 MG PO TABS: 7.5000 mg | ORAL_TABLET | Freq: Every day | ORAL | Status: DC

## 2013-03-21 ENCOUNTER — Other Ambulatory Visit: Payer: Self-pay | Admitting: *Deleted

## 2013-03-22 ENCOUNTER — Ambulatory Visit: Payer: Medicare Other | Admitting: Cardiovascular Disease

## 2013-03-30 ENCOUNTER — Encounter: Payer: Self-pay | Admitting: Adult Health

## 2013-03-30 ENCOUNTER — Ambulatory Visit (INDEPENDENT_AMBULATORY_CARE_PROVIDER_SITE_OTHER): Payer: Medicare Other | Admitting: Adult Health

## 2013-03-30 VITALS — BP 146/61 | HR 63 | Ht 66.5 in | Wt 157.8 lb

## 2013-03-30 DIAGNOSIS — I2510 Atherosclerotic heart disease of native coronary artery without angina pectoris: Secondary | ICD-10-CM

## 2013-03-30 DIAGNOSIS — I7090 Unspecified atherosclerosis: Secondary | ICD-10-CM

## 2013-03-30 DIAGNOSIS — I509 Heart failure, unspecified: Secondary | ICD-10-CM

## 2013-03-30 DIAGNOSIS — F17200 Nicotine dependence, unspecified, uncomplicated: Secondary | ICD-10-CM

## 2013-03-30 DIAGNOSIS — I5033 Acute on chronic diastolic (congestive) heart failure: Secondary | ICD-10-CM

## 2013-03-30 MED ORDER — METOPROLOL TARTRATE 100 MG PO TABS: 100.0000 mg | ORAL_TABLET | Freq: Two times a day (BID) | ORAL | Status: DC

## 2013-03-30 NOTE — Progress Notes (Signed)
HPI: [name] is a 67 year old former patient of Dr. Juanito [name] we are following for ongoing assessment and management of CAD, atrial fibrillation on chronic Coumadin therapy, and aortic mechanical valves. The patient has a drug-eluting stent to the right coronary artery, and is also being treated with Plavix but not aspirin. The patient was last seen in the office on 02/10/2013 and was doing well avoiding salt weighing daily and was medically compliant. He is here for close followup in the setting of CHF, and ongoing evaluation for decompensation.    He comes today feeling well. 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 He is medically compliant and has not gained any weight. Unfortunately he continues to smoke.     No Known Allergies  Current Outpatient Prescriptions  Medication Sig Dispense Refill  . acetaminophen (TYLENOL) 325 MG tablet Take 2 tablets (650 mg total) by mouth every 6 (six) hours as needed. For pain  30 tablet  0  . amLODipine (NORVASC) 10 MG tablet Take 1 tablet (10 mg total) by mouth daily.      Marland Kitchen atorvastatin (LIPITOR) 40 MG tablet Take 40 mg by mouth daily.      . clopidogrel (PLAVIX) 75 MG tablet Take 1 tablet (75 mg total) by mouth daily with breakfast.  30 tablet  4  . ferrous sulfate 325 (65 FE) MG EC tablet Take 325 mg by mouth daily with breakfast.      . folic acid (FOLVITE) 1 MG tablet Take 1 tablet (1 mg total) by mouth daily.  30 tablet  4  . furosemide (LASIX) 40 MG tablet Take 2 tablets (80 mg total) by mouth daily.  60 tablet  6  . HYDROcodone-acetaminophen (LORTAB) 10-500 MG per tablet Take 1 tablet by mouth 4 times daily as needed for pain.       Marland Kitchen levalbuterol (XOPENEX) 0.63 MG/3ML nebulizer solution Take 3 mLs (0.63 mg total) by nebulization every 6 (six) hours as needed for wheezing or shortness of breath.  3 mL  4  . lisinopril (PRINIVIL,ZESTRIL) 20 MG tablet Take 1 tablet  (20 mg total) by mouth daily.  30 tablet  6  . magnesium oxide (MAG-OX) 400 (241.3 MG) MG tablet Take 1 tablet (400 mg total) by mouth daily.  30 tablet  6  . metoprolol (LOPRESSOR) 100 MG tablet Take 1 tablet (100 mg total) by mouth 2 (two) times daily.  60 tablet  6  . Multiple Vitamin (MULTIVITAMIN WITH MINERALS) TABS Take 1 tablet by mouth daily.      . nitroGLYCERIN (NITROSTAT) 0.4 MG SL tablet Place 1 tablet (0.4 mg total) under the tongue every 5 (five) minutes x 3 doses as needed for chest pain.  25 tablet  3  . pantoprazole (PROTONIX) 40 MG tablet Take 1 tablet (40 mg total) by mouth daily.  30 tablet  6  . potassium chloride SA (K-DUR,KLOR-CON) 20 MEQ tablet Take 1 tablet (20 mEq total) by mouth daily.  30 tablet  6  . thiamine 100 MG tablet Take 1 tablet (100 mg total) by mouth daily.  30 tablet  4  . warfarin (COUMADIN) 5 MG tablet Take 1.5 tablets (7.5 mg total) by mouth daily. Take 1 tablet daily except 1 1/2 tablets on M,Th  45 tablet  3   No current facility-administered medications for this visit.    Past Medical History  Diagnosis Date  . Hypertension   . CAD (coronary artery disease) 2003    a. CABG/MVR/AVR-2003 (#25 St. Jude/#21 St. Jude); anticoagulation; b. negative stress nuclear-2005;  c. 01/2013 NSTEMI/Cath/PCI: LM nl, LAD 50p, 40-44m, D1 50ost, LCX 88m, RCA 50/60m (2.5x20 Promus DES), 50d, VG->Diag 100, VG->PDA 100, VG->OM 100, LIMA->LAD ok.  . Epistaxis     requiring cautery & aterial ligation-09/2009  . GERD (gastroesophageal reflux disease)   . Hyperlipemia   . Abnormal LFTs     possible cirrhosis  . Chronic anticoagulation   . Valvular heart disease     a. 2003: MVR/AVR-2003 (#25 St. Jude/#21 St. Jude);  b. 01/2013 Echo: EF 55%, Mech AVR mean grad 12, Mech MVR mean grad 6.  . Tobacco abuse     45 pack years  . Fasting hyperglycemia   . Nephrolithiasis   . Diverticulosis   . Hyponatremia   . Chronic diastolic CHF (congestive heart failure)   . Chronic renal  disease   . Hemolytic anemia     history of  . ETOH abuse   . A-fib 01/16/2013    a. on coumadin  . COPD (chronic obstructive pulmonary disease)     CONTROLED    Past Surgical History  Procedure Laterality Date  . Endocopic sphenopalatine artery ligation & cautry    . Inguinal hernia repair      Left & right  . Coronary artery bypass graft  11/2001    [facility]  . Cardiac valve replacement  11/2001    AVR and MVR-St. Jude devices  . Cataract extraction w/phaco  01/20/2011    Procedure: CATARACT EXTRACTION PHACO AND INTRAOCULAR LENS PLACEMENT (IOC);  Surgeon: Loraine Leriche T. Nile Riggs;  Location: [facility];  Service: Ophthalmology;  Laterality: Right;  CDE: 8.51  . Cataract extraction w/phaco  02/03/2011    Procedure: CATARACT EXTRACTION PHACO AND INTRAOCULAR LENS PLACEMENT (IOC);  Surgeon: Loraine Leriche T. Nile Riggs;  Location: [facility];  Service: Ophthalmology;  Laterality: Left;  CDE:10.01  . Colonoscopy  04/05/02; 08/2011    friable anal canal hemorrhoids otherwise normal; 2 diminutive polyps excised, minimal diverticulosis noted  . Esophagogastroduodenoscopy  01/2002    Dr. Karilyn Cota, submucosal esophageal lesion c/w leiomyoma  . Colonoscopy  09/02/2011    Procedure: COLONOSCOPY;  Surgeon: Corbin Ade, MD;  Location: [facility]ITE;  Service: Endoscopy;  Laterality: N/A;  8:15    ZOX:WRUEAV of systems complete and found to be negative unless listed above  PHYSICAL EXAM BP 146/61  Pulse 63  Ht 5' 6.5" (1.689 m)  Wt 157 lb 12 oz (71.555 kg)  BMI 25.08 kg/m2  General: Well developed, well nourished, in no acute distress Head: Eyes PERRLA, No xanthomas.   Normal cephalic and atramatic  Lungs: Inspiratory and expiratory wheezes in the bases, with prolonged expiratory wheezes. Heart: HRRR S1 S2, without MRG.  Pulses are 2+ & equal.            No carotid bruit. No JVD.  No abdominal bruits. No femoral bruits. Abdomen: Bowel sounds are positive, abdomen soft and non-tender without masses or                   Hernia's noted. Msk:  Back normal, normal gait. Normal strength and tone for age. Extremities: No clubbing, cyanosis or edema.  DP +1 Neuro: Alert and oriented X 3. Psych:  Good affect, responds appropriately    ASSESSMENT AND PLAN

## 2013-03-30 NOTE — Patient Instructions (Addendum)
Your physician recommends that you schedule a follow-up appointment in: 5-6 months You will receive a reminder letter two months in advance reminding you to call and schedule your appointment. If you don't receive this letter, please contact our office.  Your physician recommends that you continue on your current medications as directed. Please refer to the Current Medication list given to you today.

## 2013-03-30 NOTE — Assessment & Plan Note (Signed)
Unfortunately continues to smoke a minimum of one half pack of cigarettes a day. He is not inclined to quit. I reiterated the need to do so with his cardiovascular issues. He verbalizes understanding.

## 2013-03-30 NOTE — Assessment & Plan Note (Signed)
He is without cardiac complaint currently. No chest discomfort, dyspnea on exertion, or weakness. He would continue with risk management. In ongoing counseling concerning his tobacco abuse.

## 2013-03-30 NOTE — Assessment & Plan Note (Signed)
He appears well compensated. I am hearing some wheezing however I believe this is more related to his lung disease. There is no evidence of fluid retention. His weight is stable.

## 2013-03-30 NOTE — Progress Notes (Deleted)
Name: [name]    DOB: [birth year]  Age: 67 y.o.  MR#: [id number]       PCP:  Fredirick Maudlin, MD      Insurance: Payor: [employer] MEDICARE / Plan: AARP MEDICARE COMPLETE / Product Type: *No Product type* /   CC:    Chief Complaint  Patient presents with  . Coronary Artery Disease  . Atrial Fibrillation    VS Filed Vitals:   03/30/13 1450  BP: 146/61  Pulse: 63  Height: 5' 6.5" (1.689 m)  Weight: 157 lb 12 oz (71.555 kg)    Weights Current Weight  03/30/13 157 lb 12 oz (71.555 kg)  02/10/13 155 lb (70.308 kg)  01/31/13 147 lb (66.679 kg)    Blood Pressure  BP Readings from Last 3 Encounters:  03/30/13 146/61  02/10/13 113/59  01/31/13 108/58     Admit date:  (Not on file) Last encounter with RMR:  02/10/2013   Allergy Review of patient's allergies indicates no known allergies.  Current Outpatient Prescriptions  Medication Sig Dispense Refill  . acetaminophen (TYLENOL) 325 MG tablet Take 2 tablets (650 mg total) by mouth every 6 (six) hours as needed. For pain  30 tablet  0  . amLODipine (NORVASC) 10 MG tablet Take 1 tablet (10 mg total) by mouth daily.      Marland Kitchen atorvastatin (LIPITOR) 40 MG tablet Take 40 mg by mouth daily.      . clopidogrel (PLAVIX) 75 MG tablet Take 1 tablet (75 mg total) by mouth daily with breakfast.  30 tablet  4  . ferrous sulfate 325 (65 FE) MG EC tablet Take 325 mg by mouth daily with breakfast.      . folic acid (FOLVITE) 1 MG tablet Take 1 tablet (1 mg total) by mouth daily.  30 tablet  4  . furosemide (LASIX) 40 MG tablet Take 2 tablets (80 mg total) by mouth daily.  60 tablet  6  . HYDROcodone-acetaminophen (LORTAB) 10-500 MG per tablet Take 1 tablet by mouth 4 times daily as needed for pain.       Marland Kitchen levalbuterol (XOPENEX) 0.63 MG/3ML nebulizer solution Take 3 mLs (0.63 mg total) by nebulization every 6 (six) hours as needed for wheezing or shortness of breath.  3 mL  4  . lisinopril (PRINIVIL,ZESTRIL) 20 MG tablet Take 1 tablet  (20 mg total) by mouth daily.  30 tablet  6  . magnesium oxide (MAG-OX) 400 (241.3 MG) MG tablet Take 1 tablet (400 mg total) by mouth daily.  30 tablet  6  . metoprolol (LOPRESSOR) 100 MG tablet Take 1 tablet (100 mg total) by mouth 2 (two) times daily.  60 tablet  6  . Multiple Vitamin (MULTIVITAMIN WITH MINERALS) TABS Take 1 tablet by mouth daily.      . nitroGLYCERIN (NITROSTAT) 0.4 MG SL tablet Place 1 tablet (0.4 mg total) under the tongue every 5 (five) minutes x 3 doses as needed for chest pain.  25 tablet  3  . pantoprazole (PROTONIX) 40 MG tablet Take 1 tablet (40 mg total) by mouth daily.  30 tablet  6  . potassium chloride SA (K-DUR,KLOR-CON) 20 MEQ tablet Take 1 tablet (20 mEq total) by mouth daily.  30 tablet  6  . thiamine 100 MG tablet Take 1 tablet (100 mg total) by mouth daily.  30 tablet  4  . warfarin (COUMADIN) 5 MG tablet Take 1.5 tablets (7.5 mg total) by mouth daily. Take 1 tablet daily  except 1 1/2 tablets on M,Th  45 tablet  3   No current facility-administered medications for this visit.    Discontinued Meds:    Medications Discontinued During This Encounter  Medication Reason  . metoprolol (LOPRESSOR) 100 MG tablet Reorder    Patient Active Problem List   Diagnosis Date Noted  . NSVT (nonsustained ventricular tachycardia) 01/31/2013  . Hypomagnesemia 01/31/2013  . Paroxysmal SVT (supraventricular tachycardia) 01/30/2013  . Abnormal urinalysis 01/30/2013  . Acute on chronic diastolic CHF (congestive heart failure), NYHA class 4 01/30/2013  . Non-ST elevation myocardial infarction (NSTEMI), initial episode of care 01/30/2013  . Non-sustained ventricular tachycardia 01/17/2013  . Acute respiratory failure with hypoxia 01/17/2013  . Chest pain 01/16/2013  . NSTEMI (non-ST elevated myocardial infarction) 01/16/2013  . Arteriosclerotic cardiovascular disease (ASCVD)   . Hypertension   . Epistaxis   . GERD (gastroesophageal reflux disease)   . Abnormal LFTs    . Fasting hyperglycemia   . Nephrolithiasis   . Chronic anticoagulation 12/22/2010  . HYPONATREMIA, CHRONIC 04/06/2010  . HYPERLIPIDEMIA 02/20/2010  . ANEMIA 02/20/2010  . TOBACCO ABUSE 02/20/2010  . CHRONIC OBSTRUCTIVE PULMONARY DISEASE 02/20/2010  . DIVERTICULOSIS, COLON 02/20/2010  . MITRAL VALVE REPLACEMENT, HX OF 02/20/2010  . AORTIC VALVE REPLACEMENT, HX OF 02/20/2010    LABS    Component Value Date/Time   NA 135 01/31/2013 0450   NA 132* 01/30/2013 0445   NA 133* 01/29/2013 0451   K 4.1 01/31/2013 0450   K 3.9 01/30/2013 0445   K 3.6 01/29/2013 0451   CL 95* 01/31/2013 0450   CL 92* 01/30/2013 0445   CL 93* 01/29/2013 0451   CO2 30 01/31/2013 0450   CO2 30 01/30/2013 0445   CO2 33* 01/29/2013 0451   GLUCOSE 95 01/31/2013 0450   GLUCOSE 124* 01/30/2013 0445   GLUCOSE 102* 01/29/2013 0451   BUN 35* 01/31/2013 0450   BUN 30* 01/30/2013 0445   BUN 26* 01/29/2013 0451   CREATININE 1.34 01/31/2013 0450   CREATININE 1.25 01/30/2013 0445   CREATININE 1.10 01/29/2013 0451   CREATININE 0.84 05/18/2011 0710   CALCIUM 8.9 01/31/2013 0450   CALCIUM 8.9 01/30/2013 0445   CALCIUM 8.5 01/29/2013 0451   GFRNONAA 53* 01/31/2013 0450   GFRNONAA 58* 01/30/2013 0445   GFRNONAA 68* 01/29/2013 0451   GFRAA 62* 01/31/2013 0450   GFRAA 67* 01/30/2013 0445   GFRAA 78* 01/29/2013 0451   CMP     Component Value Date/Time   NA 135 01/31/2013 0450   K 4.1 01/31/2013 0450   CL 95* 01/31/2013 0450   CO2 30 01/31/2013 0450   GLUCOSE 95 01/31/2013 0450   BUN 35* 01/31/2013 0450   CREATININE 1.34 01/31/2013 0450   CREATININE 0.84 05/18/2011 0710   CALCIUM 8.9 01/31/2013 0450   PROT 7.6 01/27/2013 0705   ALBUMIN 3.4* 01/27/2013 0705   AST 36 01/27/2013 0705   ALT 20 01/27/2013 0705   ALKPHOS 107 01/27/2013 0705   BILITOT 0.3 01/27/2013 0705   GFRNONAA 53* 01/31/2013 0450   GFRAA 62* 01/31/2013 0450       Component Value Date/Time   WBC 7.5 01/31/2013 0450   WBC 7.9 01/30/2013 0445   WBC 8.9 01/29/2013 0451   HGB 8.6*  01/31/2013 0450   HGB 9.0* 01/30/2013 0445   HGB 8.6* 01/29/2013 0451   HCT 26.7* 01/31/2013 0450   HCT 27.2* 01/30/2013 0445   HCT 26.1* 01/29/2013 0451   MCV 86.7 01/31/2013 0450  MCV 85.5 01/30/2013 0445   MCV 85.0 01/29/2013 0451    Lipid Panel     Component Value Date/Time   CHOL 147 01/28/2013 0119   TRIG 47 01/28/2013 0119   HDL 64 01/28/2013 0119   CHOLHDL 2.3 01/28/2013 0119   VLDL 9 01/28/2013 0119   LDLCALC 74 01/28/2013 0119   LDLCALC 62 05/17/2008    ABG    Component Value Date/Time   TCO2 23 08/01/2009 1327     No results found for this basename: TSH   BNP (last 3 results)  Recent Labs  01/16/13 0656 01/20/13 0410 01/27/13 0705  PROBNP 2446.0* 6499.0* 5719.0*   Cardiac Panel (last 3 results) No results found for this basename: CKTOTAL, CKMB, TROPONINI, RELINDX,  in the last 72 hours  Iron/TIBC/Ferritin    Component Value Date/Time   IRON 171* 07/24/2009 0552   TIBC 356 07/24/2009 0552   FERRITIN 224 07/24/2009 0552     EKG Orders placed in visit on 02/10/13  . EKG 12-LEAD     Prior Assessment and Plan Problem List as of 03/30/2013   HYPERLIPIDEMIA   Last Assessment & Plan   03/22/2012 Office Visit Written 03/22/2012  1:33 PM by Jodelle Gross, NP     I have advised him smoking cessation would help with cholesterol status. He will have followup labs completed by Dr. Juanetta Gosling next week. He verbalizes understanding.    HYPONATREMIA, CHRONIC   Last Assessment & Plan   03/19/2011 Office Visit Written 03/19/2011 12:52 PM by Kathlen Brunswick, MD     Electrolytes will be reassessed.    ANEMIA   Last Assessment & Plan   08/10/2011 Office Visit Written 08/10/2011 12:32 PM by Tiffany Kocher, PA     Chronic anemia. Heme neg X 3 in 05/2011, Dr. Dietrich Pates. 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 Colonoscopy in near future.  I have discussed the risks, alternatives, benefits with regards to but not limited to the risk of reaction to medication, bleeding, infection, perforation and  the patient is agreeable to proceed. Written consent to be obtained.     TOBACCO ABUSE   CHRONIC OBSTRUCTIVE PULMONARY DISEASE   Last Assessment & Plan   03/19/2011 Office Visit Written 03/19/2011 12:48 PM by Kathlen Brunswick, MD     Unfortunately, patient is not inclined to attempt to discontinue cigarette smoking.    DIVERTICULOSIS, COLON   MITRAL VALVE REPLACEMENT, HX OF   AORTIC VALVE REPLACEMENT, HX OF   Last Assessment & Plan   02/10/2013 Office Visit Written 02/10/2013  3:32 PM by Jodelle Gross, NP     He continues on coumadin therapy. He will follow up in coumadin clinic for ongoing assessment of INR and dosing.    Chronic anticoagulation   Last Assessment & Plan   08/10/2011 Office Visit Edited 08/10/2011 12:47 PM by Tiffany Kocher, PA     Will likely need Lovenox bridge. Sent question to Dr. Marvel Plan nurse, Tomi Cheek. Await response.     Arteriosclerotic cardiovascular disease (ASCVD)   Last Assessment & Plan   01/26/2013 Office Visit Written 01/26/2013  2:57 PM by Gaylord Shih, MD     Stable after drug-eluting stent to the right coronary artery. No change in medical therapy.    Hypertension   Last Assessment & Plan   03/22/2012 Office Visit Written 03/22/2012  1:32 PM by Jodelle Gross, NP     After control of blood pressure on current medication regimen. He will continue  metoprolol, lisinopril, and amlodipine. Followup labs will be drawn next week per Dr. Juanetta Gosling office. Will receive copies of same were reviewed in the chart.    Epistaxis   Last Assessment & Plan   03/19/2011 Office Visit Written 03/19/2011 12:49 PM by Kathlen Brunswick, MD     No recurrent problems since surgery undertaken 18 months ago.    GERD (gastroesophageal reflux disease)   Abnormal LFTs   Last Assessment & Plan   08/10/2011 Office Visit Edited 08/10/2011 12:42 PM by Tiffany Kocher, PA     Chronically elevated alk phos in setting of daily etoh use. Need to rule out PBC. No cirrhosis on abd u/s  2007. No splenomegaly on exam. Platelet counts are normal. Cannot rule out underlying cirrhosis. Consider updating abd u/s if patient agreeable.   Will also check AMA, GGT.   If patient has cirrhosis, would advise egd to r/o varices, which could be done at time of tcs.    Fasting hyperglycemia   Last Assessment & Plan   03/19/2011 Office Visit Written 03/19/2011 12:49 PM by Kathlen Brunswick, MD     Repeat FBS will be obtained.    Nephrolithiasis   Chest pain   NSTEMI (non-ST elevated myocardial infarction)   Last Assessment & Plan   02/10/2013 Office Visit Written 02/10/2013  3:27 PM by Jodelle Gross, NP     He is without cardiac complaint. He is medically compliant. He will continue with risk factor management with close follow up. No changes in his medications regimen.    Non-sustained ventricular tachycardia   Acute respiratory failure with hypoxia   Paroxysmal SVT (supraventricular tachycardia)   Abnormal urinalysis   Acute on chronic diastolic CHF (congestive heart failure), NYHA class 4   Last Assessment & Plan   02/10/2013 Office Visit Written 02/10/2013  3:30 PM by Jodelle Gross, NP     He is weighing himself daily, he is avoiding salt and walking daily. Wts have been stable at 146-148 lbs. He is taking control of his health and making a good effort at quitting smoking. He is congratulated and encouraged to continue this lifestyle change. He will follow up with Dr. Beulah Gandy at previously scheduled appt.    Non-ST elevation myocardial infarction (NSTEMI), initial episode of care   NSVT (nonsustained ventricular tachycardia)   Hypomagnesemia       Imaging: No results found.

## 2013-04-10 ENCOUNTER — Ambulatory Visit (INDEPENDENT_AMBULATORY_CARE_PROVIDER_SITE_OTHER): Payer: Medicare Other | Admitting: *Deleted

## 2013-04-10 DIAGNOSIS — Z7901 Long term (current) use of anticoagulants: Secondary | ICD-10-CM

## 2013-04-10 DIAGNOSIS — Z9889 Other specified postprocedural states: Secondary | ICD-10-CM

## 2013-04-10 DIAGNOSIS — Z954 Presence of other heart-valve replacement: Secondary | ICD-10-CM

## 2013-04-10 LAB — POCT INR: INR: 4.1

## 2013-05-01 ENCOUNTER — Ambulatory Visit (INDEPENDENT_AMBULATORY_CARE_PROVIDER_SITE_OTHER): Payer: Medicare Other | Admitting: *Deleted

## 2013-05-01 DIAGNOSIS — Z7901 Long term (current) use of anticoagulants: Secondary | ICD-10-CM

## 2013-05-01 DIAGNOSIS — Z954 Presence of other heart-valve replacement: Secondary | ICD-10-CM

## 2013-05-01 DIAGNOSIS — Z9889 Other specified postprocedural states: Secondary | ICD-10-CM

## 2013-05-23 ENCOUNTER — Encounter (HOSPITAL_COMMUNITY): Payer: Self-pay

## 2013-05-29 ENCOUNTER — Encounter (HOSPITAL_COMMUNITY): Payer: Self-pay | Admitting: Emergency Medicine

## 2013-05-29 ENCOUNTER — Inpatient Hospital Stay (HOSPITAL_COMMUNITY)
Admission: EM | Admit: 2013-05-29 | Discharge: 2013-06-01 | DRG: 292 | Disposition: A | Discharge status: Home / Self Care | Payer: Medicare Other | Attending: Pulmonary Disease | Admitting: Pulmonary Disease

## 2013-05-29 ENCOUNTER — Emergency Department (HOSPITAL_COMMUNITY): Payer: Medicare Other

## 2013-05-29 DIAGNOSIS — N189 Chronic kidney disease, unspecified: Secondary | ICD-10-CM | POA: Diagnosis present

## 2013-05-29 DIAGNOSIS — I10 Essential (primary) hypertension: Secondary | ICD-10-CM | POA: Diagnosis present

## 2013-05-29 DIAGNOSIS — J9601 Acute respiratory failure with hypoxia: Secondary | ICD-10-CM | POA: Diagnosis present

## 2013-05-29 DIAGNOSIS — D649 Anemia, unspecified: Secondary | ICD-10-CM

## 2013-05-29 DIAGNOSIS — Z91199 Patient's noncompliance with other medical treatment and regimen due to unspecified reason: Secondary | ICD-10-CM

## 2013-05-29 DIAGNOSIS — I2510 Atherosclerotic heart disease of native coronary artery without angina pectoris: Secondary | ICD-10-CM | POA: Diagnosis present

## 2013-05-29 DIAGNOSIS — R633 Feeding difficulties: Secondary | ICD-10-CM

## 2013-05-29 DIAGNOSIS — I4729 Other ventricular tachycardia: Secondary | ICD-10-CM

## 2013-05-29 DIAGNOSIS — F17200 Nicotine dependence, unspecified, uncomplicated: Secondary | ICD-10-CM

## 2013-05-29 DIAGNOSIS — I214 Non-ST elevation (NSTEMI) myocardial infarction: Secondary | ICD-10-CM

## 2013-05-29 DIAGNOSIS — J4489 Other specified chronic obstructive pulmonary disease: Secondary | ICD-10-CM

## 2013-05-29 DIAGNOSIS — Z23 Encounter for immunization: Secondary | ICD-10-CM

## 2013-05-29 DIAGNOSIS — K219 Gastro-esophageal reflux disease without esophagitis: Secondary | ICD-10-CM | POA: Diagnosis present

## 2013-05-29 DIAGNOSIS — I472 Ventricular tachycardia, unspecified: Secondary | ICD-10-CM | POA: Diagnosis present

## 2013-05-29 DIAGNOSIS — I471 Supraventricular tachycardia, unspecified: Secondary | ICD-10-CM

## 2013-05-29 DIAGNOSIS — Z9889 Other specified postprocedural states: Secondary | ICD-10-CM

## 2013-05-29 DIAGNOSIS — Z9119 Patient's noncompliance with other medical treatment and regimen: Secondary | ICD-10-CM

## 2013-05-29 DIAGNOSIS — F1010 Alcohol abuse, uncomplicated: Secondary | ICD-10-CM | POA: Diagnosis present

## 2013-05-29 DIAGNOSIS — Z951 Presence of aortocoronary bypass graft: Secondary | ICD-10-CM

## 2013-05-29 DIAGNOSIS — I4891 Unspecified atrial fibrillation: Secondary | ICD-10-CM | POA: Diagnosis present

## 2013-05-29 DIAGNOSIS — I252 Old myocardial infarction: Secondary | ICD-10-CM

## 2013-05-29 DIAGNOSIS — I509 Heart failure, unspecified: Secondary | ICD-10-CM

## 2013-05-29 DIAGNOSIS — Z954 Presence of other heart-valve replacement: Secondary | ICD-10-CM

## 2013-05-29 DIAGNOSIS — R638 Other symptoms and signs concerning food and fluid intake: Secondary | ICD-10-CM

## 2013-05-29 DIAGNOSIS — J449 Chronic obstructive pulmonary disease, unspecified: Secondary | ICD-10-CM | POA: Diagnosis present

## 2013-05-29 DIAGNOSIS — I5033 Acute on chronic diastolic (congestive) heart failure: Principal | ICD-10-CM | POA: Diagnosis present

## 2013-05-29 DIAGNOSIS — E871 Hypo-osmolality and hyponatremia: Secondary | ICD-10-CM | POA: Diagnosis present

## 2013-05-29 DIAGNOSIS — E785 Hyperlipidemia, unspecified: Secondary | ICD-10-CM

## 2013-05-29 DIAGNOSIS — Z952 Presence of prosthetic heart valve: Secondary | ICD-10-CM

## 2013-05-29 DIAGNOSIS — Z9861 Coronary angioplasty status: Secondary | ICD-10-CM

## 2013-05-29 DIAGNOSIS — E875 Hyperkalemia: Secondary | ICD-10-CM | POA: Diagnosis present

## 2013-05-29 DIAGNOSIS — Z7901 Long term (current) use of anticoagulants: Secondary | ICD-10-CM

## 2013-05-29 DIAGNOSIS — I129 Hypertensive chronic kidney disease with stage 1 through stage 4 chronic kidney disease, or unspecified chronic kidney disease: Secondary | ICD-10-CM | POA: Diagnosis present

## 2013-05-29 DIAGNOSIS — Z87442 Personal history of urinary calculi: Secondary | ICD-10-CM

## 2013-05-29 LAB — CBC WITH DIFFERENTIAL/PLATELET
Basophils Absolute: 0 10*3/uL (ref 0.0–0.1)
Eosinophils Absolute: 0.2 10*3/uL (ref 0.0–0.7)
Lymphocytes Relative: 5 % — ABNORMAL LOW (ref 12–46)
Lymphs Abs: 0.3 10*3/uL — ABNORMAL LOW (ref 0.7–4.0)
Neutrophils Relative %: 83 % — ABNORMAL HIGH (ref 43–77)
Platelets: 319 10*3/uL (ref 150–400)
RBC: 2.97 MIL/uL — ABNORMAL LOW (ref 4.22–5.81)
WBC: 7.3 10*3/uL (ref 4.0–10.5)

## 2013-05-29 LAB — BASIC METABOLIC PANEL
GFR calc non Af Amer: 60 mL/min — ABNORMAL LOW (ref >90)
Glucose, Bld: 107 mg/dL — ABNORMAL HIGH (ref 70–99)
Potassium: 4.8 mEq/L (ref 3.5–5.1)
Sodium: 117 mEq/L — CL (ref 135–145)

## 2013-05-29 LAB — TROPONIN I: Troponin I: <0.3 ng/mL (ref <0.30)

## 2013-05-29 LAB — HEPATIC FUNCTION PANEL
Albumin: 3.8 g/dL (ref 3.5–5.2)
Alkaline Phosphatase: 137 U/L — ABNORMAL HIGH (ref 39–117)
Total Protein: 7.6 g/dL (ref 6.0–8.3)

## 2013-05-29 LAB — MRSA PCR SCREENING: MRSA by PCR: NEGATIVE

## 2013-05-29 LAB — PROTIME-INR
INR: 4.63 — ABNORMAL HIGH (ref 0.00–1.49)
Prothrombin Time: 41.9 seconds — ABNORMAL HIGH (ref 11.6–15.2)

## 2013-05-29 LAB — PRO B NATRIURETIC PEPTIDE: Pro B Natriuretic peptide (BNP): 6106 pg/mL — ABNORMAL HIGH (ref 0–125)

## 2013-05-29 MED ORDER — HYDROCODONE-ACETAMINOPHEN 5-325 MG PO TABS
1.0000 | ORAL_TABLET | ORAL | Status: DC | PRN
  Administered 2013-05-30: 1 via ORAL
  Filled 2013-05-29: qty 1

## 2013-05-29 MED ORDER — CLOPIDOGREL BISULFATE 75 MG PO TABS
75.0000 mg | ORAL_TABLET | Freq: Every day | ORAL | Status: DC
Start: 2013-05-30 — End: 2013-06-01
  Administered 2013-05-30 – 2013-06-01 (×3): 75 mg via ORAL
  Filled 2013-05-29 (×3): qty 1

## 2013-05-29 MED ORDER — INFLUENZA VAC SPLIT QUAD 0.5 ML IM SUSP
0.5000 mL | INTRAMUSCULAR | Status: AC
  Administered 2013-05-30: 0.5 mL via INTRAMUSCULAR
  Filled 2013-05-29: qty 0.5

## 2013-05-29 MED ORDER — FERROUS SULFATE 325 (65 FE) MG PO TABS
325.0000 mg | ORAL_TABLET | Freq: Every day | ORAL | Status: DC
  Administered 2013-05-30 – 2013-06-01 (×3): 325 mg via ORAL
  Filled 2013-05-29 (×3): qty 1

## 2013-05-29 MED ORDER — SODIUM CHLORIDE 0.9 % IJ SOLN
3.0000 mL | Freq: Two times a day (BID) | INTRAMUSCULAR | Status: DC
  Administered 2013-05-29 – 2013-06-01 (×5): 3 mL via INTRAVENOUS

## 2013-05-29 MED ORDER — VITAMIN B-1 100 MG PO TABS
100.0000 mg | ORAL_TABLET | Freq: Every day | ORAL | Status: DC
  Administered 2013-05-30 – 2013-06-01 (×3): 100 mg via ORAL
  Filled 2013-05-29 (×3): qty 1

## 2013-05-29 MED ORDER — ALBUTEROL SULFATE (5 MG/ML) 0.5% IN NEBU
5.0000 mg | INHALATION_SOLUTION | Freq: Once | RESPIRATORY_TRACT | Status: AC
  Administered 2013-05-29: 5 mg via RESPIRATORY_TRACT
  Filled 2013-05-29: qty 1

## 2013-05-29 MED ORDER — ACETAMINOPHEN 325 MG PO TABS: 650.0000 mg | ORAL_TABLET | Freq: Four times a day (QID) | ORAL | Status: DC | PRN

## 2013-05-29 MED ORDER — ONDANSETRON HCL 4 MG/2ML IJ SOLN: 4.0000 mg | Freq: Four times a day (QID) | INTRAMUSCULAR | Status: DC | PRN

## 2013-05-29 MED ORDER — FUROSEMIDE 10 MG/ML IJ SOLN
80.0000 mg | Freq: Once | INTRAMUSCULAR | Status: AC
  Administered 2013-05-29: 80 mg via INTRAVENOUS
  Filled 2013-05-29: qty 8

## 2013-05-29 MED ORDER — ACETAMINOPHEN 650 MG RE SUPP: 650.0000 mg | Freq: Four times a day (QID) | RECTAL | Status: DC | PRN

## 2013-05-29 MED ORDER — WARFARIN - PHARMACIST DOSING INPATIENT
Status: DC
  Administered 2013-06-01: 16:00:00

## 2013-05-29 MED ORDER — SODIUM CHLORIDE 0.9 % IJ SOLN
3.0000 mL | INTRAMUSCULAR | Status: DC | PRN
  Administered 2013-05-29: 3 mL via INTRAVENOUS

## 2013-05-29 MED ORDER — AMLODIPINE BESYLATE 5 MG PO TABS
10.0000 mg | ORAL_TABLET | Freq: Every day | ORAL | Status: DC
  Administered 2013-05-30 – 2013-06-01 (×3): 10 mg via ORAL
  Filled 2013-05-29 (×3): qty 2

## 2013-05-29 MED ORDER — IPRATROPIUM BROMIDE 0.02 % IN SOLN
0.5000 mg | Freq: Once | RESPIRATORY_TRACT | Status: AC
  Administered 2013-05-29: 0.5 mg via RESPIRATORY_TRACT
  Filled 2013-05-29: qty 2.5

## 2013-05-29 MED ORDER — MORPHINE SULFATE 2 MG/ML IJ SOLN
2.0000 mg | INTRAMUSCULAR | Status: DC | PRN
Start: 2013-05-29 — End: 2013-06-01

## 2013-05-29 MED ORDER — FOLIC ACID 1 MG PO TABS
1.0000 mg | ORAL_TABLET | Freq: Every day | ORAL | Status: DC
  Administered 2013-05-30 – 2013-06-01 (×3): 1 mg via ORAL
  Filled 2013-05-29 (×3): qty 1

## 2013-05-29 MED ORDER — METHYLPREDNISOLONE SODIUM SUCC 125 MG IJ SOLR
125.0000 mg | Freq: Once | INTRAMUSCULAR | Status: AC
  Administered 2013-05-29: 125 mg via INTRAVENOUS
  Filled 2013-05-29: qty 2

## 2013-05-29 MED ORDER — LISINOPRIL 10 MG PO TABS
20.0000 mg | ORAL_TABLET | Freq: Every day | ORAL | Status: DC
  Administered 2013-05-30 – 2013-06-01 (×3): 20 mg via ORAL
  Filled 2013-05-29 (×3): qty 2

## 2013-05-29 MED ORDER — ONDANSETRON HCL 4 MG PO TABS: 4.0000 mg | ORAL_TABLET | Freq: Four times a day (QID) | ORAL | Status: DC | PRN

## 2013-05-29 MED ORDER — MAGNESIUM OXIDE 400 (241.3 MG) MG PO TABS
400.0000 mg | ORAL_TABLET | Freq: Every day | ORAL | Status: DC
  Administered 2013-05-30 – 2013-06-01 (×3): 400 mg via ORAL
  Filled 2013-05-29 (×3): qty 1

## 2013-05-29 MED ORDER — ADULT MULTIVITAMIN W/MINERALS CH
1.0000 | ORAL_TABLET | Freq: Every day | ORAL | Status: DC
  Administered 2013-05-30 – 2013-06-01 (×3): 1 via ORAL
  Filled 2013-05-29 (×3): qty 1

## 2013-05-29 MED ORDER — NITROGLYCERIN 0.4 MG SL SUBL: 0.4000 mg | SUBLINGUAL_TABLET | SUBLINGUAL | Status: DC | PRN

## 2013-05-29 MED ORDER — TRAZODONE HCL 50 MG PO TABS
25.0000 mg | ORAL_TABLET | Freq: Every evening | ORAL | Status: DC | PRN
  Administered 2013-05-31: 25 mg via ORAL
  Filled 2013-05-29: qty 1

## 2013-05-29 MED ORDER — PANTOPRAZOLE SODIUM 40 MG PO TBEC
40.0000 mg | DELAYED_RELEASE_TABLET | Freq: Every day | ORAL | Status: DC
  Administered 2013-05-30 – 2013-06-01 (×3): 40 mg via ORAL
  Filled 2013-05-29 (×3): qty 1

## 2013-05-29 MED ORDER — ATORVASTATIN CALCIUM 40 MG PO TABS
40.0000 mg | ORAL_TABLET | Freq: Every day | ORAL | Status: DC
  Administered 2013-05-30 – 2013-06-01 (×3): 40 mg via ORAL
  Filled 2013-05-29 (×3): qty 1

## 2013-05-29 MED ORDER — SODIUM CHLORIDE 0.9 % IJ SOLN: 3.0000 mL | Freq: Two times a day (BID) | INTRAMUSCULAR | Status: DC

## 2013-05-29 MED ORDER — LEVALBUTEROL HCL 0.63 MG/3ML IN NEBU: 0.6300 mg | INHALATION_SOLUTION | Freq: Four times a day (QID) | RESPIRATORY_TRACT | Status: DC | PRN

## 2013-05-29 MED ORDER — METOPROLOL TARTRATE 50 MG PO TABS
100.0000 mg | ORAL_TABLET | Freq: Two times a day (BID) | ORAL | Status: DC
  Administered 2013-05-29 – 2013-06-01 (×5): 100 mg via ORAL
  Filled 2013-05-29 (×6): qty 2

## 2013-05-29 MED ORDER — SODIUM CHLORIDE 0.9 % IV SOLN: 250.0000 mL | INTRAVENOUS | Status: DC | PRN

## 2013-05-29 NOTE — ED Notes (Signed)
States he feels like his breathing is a lot better.

## 2013-05-29 NOTE — ED Notes (Addendum)
Pt c/o SOB and swelling in ankles x 2 days.  Denies pain.   Pt had breathing treatment at home this am around 0600.

## 2013-05-29 NOTE — ED Provider Notes (Signed)
CSN: [id number]     Arrival date & time 05/29/13  0808 History  This chart was scribed for [name] Lennert, MD by Bennett Scrape, ED Scribe. This patient was seen in room APA07/AP[facility]07 and the patient's care was started at 8:20 AM.   Chief Complaint  Patient presents with  . Shortness of Breath    Patient is a 67 y.o. male presenting with shortness of breath. The history is provided by the patient. No language interpreter was used.  Shortness of Breath Severity:  Moderate Onset quality:  Gradual Duration:  2 days Timing:  Constant Progression:  Worsening Chronicity:  Recurrent (h/o COPD and CHF) Associated symptoms: no abdominal pain, no chest pain, no cough, no fever, no headaches and no rash     HPI Comments: [name] is a 67 y.o. male with a h/o significant for COPD who presents to the Emergency Department complaining of 2 days of gradual onset, gradually worsening, constant SOB with associated increasing bilateral lower extremity swelling. Pt was 66% on RA in triage. He was started on 4L [place] with an increased to 94%. He denies having home oxygen.  He denies any prior intubations for breathing difficulty. He is currently on fluid pills and denies any recent missed doses. He denies any fevers or chills. He also has a h/o CAD and CHF with a CABG and valve replacement in 2003.   Past Medical History  Diagnosis Date  . Hypertension   . CAD (coronary artery disease) 2003    a. CABG/MVR/AVR-2003 (#25 St. Jude/#21 St. Jude); anticoagulation; b. negative stress nuclear-2005;  c. 01/2013 NSTEMI/Cath/PCI: LM nl, LAD 50p, 40-55m, D1 50ost, LCX 36m, RCA 50/69m (2.5x20 Promus DES), 50d, VG->Diag 100, VG->PDA 100, VG->OM 100, LIMA->LAD ok.  . Epistaxis     requiring cautery & aterial ligation-09/2009  . GERD (gastroesophageal reflux disease)   . Hyperlipemia   . Abnormal LFTs     possible cirrhosis  . Chronic anticoagulation   . Valvular heart disease     a. 2003: MVR/AVR-2003 (#25 St.  Jude/#21 St. Jude);  b. 01/2013 Echo: EF 55%, Mech AVR mean grad 12, Mech MVR mean grad 6.  . Tobacco abuse     45 pack years  . Fasting hyperglycemia   . Nephrolithiasis   . Diverticulosis   . Hyponatremia   . Chronic diastolic CHF (congestive heart failure)   . Chronic renal disease   . Hemolytic anemia     history of  . ETOH abuse   . A-fib 01/16/2013    a. on coumadin  . COPD (chronic obstructive pulmonary disease)     CONTROLED   Past Surgical History  Procedure Laterality Date  . Endocopic sphenopalatine artery ligation & cautry    . Inguinal hernia repair      Left & right  . Coronary artery bypass graft  11/2001    [facility]  . Cardiac valve replacement  11/2001    AVR and MVR-St. Jude devices  . Cataract extraction w/phaco  01/20/2011    Procedure: CATARACT EXTRACTION PHACO AND INTRAOCULAR LENS PLACEMENT (IOC);  Surgeon: Loraine Leriche T. Nile Riggs;  Location: [facility];  Service: Ophthalmology;  Laterality: Right;  CDE: 8.51  . Cataract extraction w/phaco  02/03/2011    Procedure: CATARACT EXTRACTION PHACO AND INTRAOCULAR LENS PLACEMENT (IOC);  Surgeon: Loraine Leriche T. Nile Riggs;  Location: [facility];  Service: Ophthalmology;  Laterality: Left;  CDE:10.01  . Colonoscopy  04/05/02; 08/2011    friable anal canal hemorrhoids otherwise  normal; 2 diminutive polyps excised, minimal diverticulosis noted  . Esophagogastroduodenoscopy  01/2002    Dr. Karilyn Cota, submucosal esophageal lesion c/w leiomyoma  . Colonoscopy  09/02/2011    Procedure: COLONOSCOPY;  Surgeon: Corbin Ade, MD;  Location: [facility] SU[facility];  Service: Endoscopy;  Laterality: N/A;  8:15   Family History  Problem Relation Age of Onset  . Hypotension Neg Hx   . Anesthesia problems Neg Hx   . Malignant hyperthermia Neg Hx   . Pseudochol deficiency Neg Hx   . Colon cancer Neg Hx   . Liver disease Neg Hx    History  Substance Use Topics  . Smoking status: Current Every Day Smoker -- 0.50 packs/day for 52 years    Types: Cigarettes   . Smokeless tobacco: Never Used     Comment: STATES HE IS CUTTING DOWN now only 1/2 ppd (01/27/2013)  . Alcohol Use: 4.2 oz/week    6 Cans of beer, 1 Shots of liquor per week     Comment: DAILY    Review of Systems  Constitutional: Negative for fever, chills, appetite change and fatigue.  HENT: Negative for congestion, ear discharge and sinus pressure.   Eyes: Negative for discharge.  Respiratory: Positive for shortness of breath. Negative for cough.   Cardiovascular: Positive for leg swelling. Negative for chest pain.  Gastrointestinal: Negative for abdominal pain and diarrhea.  Genitourinary: Negative for frequency and hematuria.  Musculoskeletal: Negative for back pain.  Skin: Negative for rash.  Neurological: Negative for seizures and headaches.  Psychiatric/Behavioral: Negative for hallucinations.    Allergies  Review of patient's allergies indicates no known allergies.  Home Medications   Current Outpatient Rx  Name  Route  Sig  Dispense  Refill  . acetaminophen (TYLENOL) 325 MG tablet   Oral   Take 2 tablets (650 mg total) by mouth every 6 (six) hours as needed. For pain   30 tablet   0   . amLODipine (NORVASC) 10 MG tablet   Oral   Take 1 tablet (10 mg total) by mouth daily.         Marland Kitchen atorvastatin (LIPITOR) 40 MG tablet   Oral   Take 40 mg by mouth daily.         . clopidogrel (PLAVIX) 75 MG tablet   Oral   Take 1 tablet (75 mg total) by mouth daily with breakfast.   30 tablet   4   . ferrous sulfate 325 (65 FE) MG EC tablet   Oral   Take 325 mg by mouth daily with breakfast.         . folic acid (FOLVITE) 1 MG tablet   Oral   Take 1 tablet (1 mg total) by mouth daily.   30 tablet   4   . furosemide (LASIX) 40 MG tablet   Oral   Take 2 tablets (80 mg total) by mouth daily.   60 tablet   6   . HYDROcodone-acetaminophen (LORTAB) 10-500 MG per tablet   Oral   Take 1 tablet by mouth 4 times daily as needed for pain.          Marland Kitchen  levalbuterol (XOPENEX) 0.63 MG/3ML nebulizer solution   Nebulization   Take 3 mLs (0.63 mg total) by nebulization every 6 (six) hours as needed for wheezing or shortness of breath.   3 mL   4   . lisinopril (PRINIVIL,ZESTRIL) 20 MG tablet   Oral   Take 1 tablet (20 mg total)  by mouth daily.   30 tablet   6   . magnesium oxide (MAG-OX) 400 (241.3 MG) MG tablet   Oral   Take 1 tablet (400 mg total) by mouth daily.   30 tablet   6   . metoprolol (LOPRESSOR) 100 MG tablet   Oral   Take 1 tablet (100 mg total) by mouth 2 (two) times daily.   60 tablet   6   . Multiple Vitamin (MULTIVITAMIN WITH MINERALS) TABS   Oral   Take 1 tablet by mouth daily.         . nitroGLYCERIN (NITROSTAT) 0.4 MG SL tablet   Sublingual   Place 1 tablet (0.4 mg total) under the tongue every 5 (five) minutes x 3 doses as needed for chest pain.   25 tablet   3   . pantoprazole (PROTONIX) 40 MG tablet   Oral   Take 1 tablet (40 mg total) by mouth daily.   30 tablet   6   . thiamine 100 MG tablet   Oral   Take 1 tablet (100 mg total) by mouth daily.   30 tablet   4   . warfarin (COUMADIN) 5 MG tablet   Oral   Take 5-7.5 mg by mouth See admin instructions. Take 1.5 tab (7.5 mg) monday Wednesday Friday, and 5 mg on Tuesday, Thursday, Saturday Sunday          Triage Vitals: BP 159/67  Pulse 62  Resp 21  SpO2 98%  Physical Exam  Nursing note and vitals reviewed. Constitutional: He is oriented to person, place, and time. He appears well-developed and well-nourished.  HENT:  Head: Normocephalic and atraumatic.  Eyes: Conjunctivae and EOM are normal. No scleral icterus.  Neck: Neck supple. No thyromegaly present.  Cardiovascular: Regular rhythm.  Tachycardia present.  Exam reveals no gallop and no friction rub.   No murmur heard. Pulmonary/Chest: No stridor. He has no wheezes (moderate bilaterally). He has no rales. He exhibits no tenderness.  Abdominal: He exhibits no distension. There  is no tenderness. There is no rebound.  Musculoskeletal: Normal range of motion. He exhibits edema (2 + edema to bilateral ankles).  Lymphadenopathy:    He has no cervical adenopathy.  Neurological: He is oriented to person, place, and time. He exhibits normal muscle tone. Coordination normal.  Skin: No rash noted. No erythema.  Psychiatric: He has a normal mood and affect. His behavior is normal.    ED Course  Procedures (including critical care time)  DIAGNOSTIC STUDIES: Oxygen Saturation is 95% on 4L [place], adequate by my interpretation.    COORDINATION OF CARE: 8:23 AM-Discussed treatment plan which includes CXR, CBC panel, BMP and troponin with pt at bedside and pt agreed to plan.   9:29 AM-Pt rechecked and feels improved with medications listed above. Upon re-exam, pt now has minimal wheezing. Oxygen sat is 92% on 2L. Informed pt of hyponatremia and pt denies any prior episodes. Will discussion admission with Juanetta Gosling, PMD.  10:03 AM-Consult complete with Dr. Juanetta Gosling, Hospitalist. Patient case explained and discussed. Dr. Juanetta Gosling agrees to admit patient for further evaluation and treatment. Call ended at 10:05 AM  Labs Review Labs Reviewed  CBC WITH DIFFERENTIAL - Abnormal; Notable for the following:    RBC 2.97 (*)    Hemoglobin 9.7 (*)    HCT 27.6 (*)    RDW 16.4 (*)    Neutrophils Relative % 83 (*)    Lymphocytes Relative 5 (*)    Lymphs  Abs 0.3 (*)    All other components within normal limits  BASIC METABOLIC PANEL - Abnormal; Notable for the following:    Sodium 117 (*)    Chloride 82 (*)    Glucose, Bld 107 (*)    BUN 29 (*)    GFR calc non Af Amer 60 (*)    GFR calc Af Amer 69 (*)    All other components within normal limits  HEPATIC FUNCTION PANEL - Abnormal; Notable for the following:    Alkaline Phosphatase 137 (*)    All other components within normal limits  PRO B NATRIURETIC PEPTIDE - Abnormal; Notable for the following:    Pro B Natriuretic peptide (BNP)  6106.0 (*)    All other components within normal limits  TROPONIN I  PROTIME-INR   Imaging Review Dg Chest Portable 1 View  05/29/2013   CLINICAL DATA:  Shortness of breath  EXAM: PORTABLE CHEST - 1 VIEW  COMPARISON:  01/29/2013  FINDINGS: Heart is enlarged. Prior coronary bypass changes and prosthetic valve replacement noted. New diffuse bilateral mixed interstitial and airspace opacities, similar to several prior exams compatible with CHF. No large effusion or pneumothorax. Trachea midline. Atherosclerosis of the aorta evident.  IMPRESSION: Diffuse mixed interstitial and airspace process compatible with recurrent CHF   Electronically Signed   By: Ruel Favors M.D.   On: 05/29/2013 08:33  CRITICAL CARE Performed by: Delonna Ney L Total critical care time: 40 Critical care time was exclusive of separately billable procedures and treating other patients. Critical care was necessary to treat or prevent imminent or life-threatening deterioration. Critical care was time spent personally by me on the following activities: development of treatment plan with patient and/or surrogate as well as nursing, discussions with consultants, evaluation of patient's response to treatment, examination of patient, obtaining history from patient or surrogate, ordering and performing treatments and interventions, ordering and review of laboratory studies, ordering and review of radiographic studies, pulse oximetry and re-evaluation of patient's condition.   EKG Interpretation    Date/Time:  Monday May 29 2013 08:15:22 EST Ventricular Rate:  67 PR Interval:  194 QRS Duration: 104 QT Interval:  458 QTC Calculation: 483 R Axis:   -52 Text Interpretation:  Normal sinus rhythm Left axis deviation Pulmonary disease pattern T wave abnormality, consider lateral ischemia Prolonged QT Abnormal ECG When compared with ECG of 29-Jan-2013 07:46, Premature supraventricular complexes are no longer Present QRS axis  Shifted left Criteria for Inferior infarct are no longer Present Nonspecific T wave abnormality no longer evident in Inferior leads Confirmed by Carmichael Burdette  MD, Cristella Stiver ([id number]) on 05/29/2013 8:45:43 AM            MDM  hyponatremia The chart was scribed for me under my direct supervision.  I personally performed the history, physical, and medical decision making and all procedures in the evaluation of this patient.[name] Lennert, MD 05/29/13 1011

## 2013-05-29 NOTE — Progress Notes (Signed)
ANTICOAGULATION CONSULT NOTE - Initial Consult  Pharmacy Consult for Coumadin (chronic RX PTA) Indication: Mechanical AVR/MVR, Afib  No Known Allergies  Patient Measurements:    Vital Signs: Temp: 97.6 F (36.4 C) (11/24 1450) Temp src: Oral (11/24 1403) BP: 141/50 mmHg (11/24 1403) Pulse Rate: 64 (11/24 1403)  Labs:  Recent Labs  05/29/13 0826 05/29/13 0845  HGB 9.7*  --   HCT 27.6*  --   PLT 319  --   LABPROT  --  41.9*  INR  --  4.63*  CREATININE 1.22  --   TROPONINI <0.30  --    The CrCl is unknown because both a height and weight (above a minimum accepted value) are required for this calculation.  Medical History: Past Medical History  Diagnosis Date  . Hypertension   . CAD (coronary artery disease) 2003    a. CABG/MVR/AVR-2003 (#25 St. Jude/#21 St. Jude); anticoagulation; b. negative stress nuclear-2005;  c. 01/2013 NSTEMI/Cath/PCI: LM nl, LAD 50p, 40-58m, D1 50ost, LCX 30m, RCA 50/61m (2.5x20 Promus DES), 50d, VG->Diag 100, VG->PDA 100, VG->OM 100, LIMA->LAD ok.  . Epistaxis     requiring cautery & aterial ligation-09/2009  . GERD (gastroesophageal reflux disease)   . Hyperlipemia   . Abnormal LFTs     possible cirrhosis  . Chronic anticoagulation   . Valvular heart disease     a. 2003: MVR/AVR-2003 (#25 St. Jude/#21 St. Jude);  b. 01/2013 Echo: EF 55%, Mech AVR mean grad 12, Mech MVR mean grad 6.  . Tobacco abuse     45 pack years  . Fasting hyperglycemia   . Nephrolithiasis   . Diverticulosis   . Hyponatremia   . Chronic diastolic CHF (congestive heart failure)   . Chronic renal disease   . Hemolytic anemia     history of  . ETOH abuse   . A-fib 01/16/2013    a. on coumadin  . COPD (chronic obstructive pulmonary disease)     CONTROLED   Medications:  Prescriptions prior to admission  Medication Sig Dispense Refill  . acetaminophen (TYLENOL) 325 MG tablet Take 2 tablets (650 mg total) by mouth every 6 (six) hours as needed. For pain  30 tablet   0  . amLODipine (NORVASC) 10 MG tablet Take 1 tablet (10 mg total) by mouth daily.      Marland Kitchen atorvastatin (LIPITOR) 40 MG tablet Take 40 mg by mouth daily.      . clopidogrel (PLAVIX) 75 MG tablet Take 1 tablet (75 mg total) by mouth daily with breakfast.  30 tablet  4  . ferrous sulfate 325 (65 FE) MG EC tablet Take 325 mg by mouth daily with breakfast.      . folic acid (FOLVITE) 1 MG tablet Take 1 tablet (1 mg total) by mouth daily.  30 tablet  4  . furosemide (LASIX) 40 MG tablet Take 2 tablets (80 mg total) by mouth daily.  60 tablet  6  . HYDROcodone-acetaminophen (LORTAB) 10-500 MG per tablet Take 1 tablet by mouth 4 times daily as needed for pain.       Marland Kitchen levalbuterol (XOPENEX) 0.63 MG/3ML nebulizer solution Take 3 mLs (0.63 mg total) by nebulization every 6 (six) hours as needed for wheezing or shortness of breath.  3 mL  4  . lisinopril (PRINIVIL,ZESTRIL) 20 MG tablet Take 1 tablet (20 mg total) by mouth daily.  30 tablet  6  . magnesium oxide (MAG-OX) 400 (241.3 MG) MG tablet Take 1 tablet (400 mg  total) by mouth daily.  30 tablet  6  . metoprolol (LOPRESSOR) 100 MG tablet Take 1 tablet (100 mg total) by mouth 2 (two) times daily.  60 tablet  6  . Multiple Vitamin (MULTIVITAMIN WITH MINERALS) TABS Take 1 tablet by mouth daily.      . nitroGLYCERIN (NITROSTAT) 0.4 MG SL tablet Place 1 tablet (0.4 mg total) under the tongue every 5 (five) minutes x 3 doses as needed for chest pain.  25 tablet  3  . pantoprazole (PROTONIX) 40 MG tablet Take 1 tablet (40 mg total) by mouth daily.  30 tablet  6  . thiamine 100 MG tablet Take 1 tablet (100 mg total) by mouth daily.  30 tablet  4  . warfarin (COUMADIN) 5 MG tablet Take 5-7.5 mg by mouth See admin instructions. Take 1.5 tab (7.5 mg) monday Wednesday Friday, and 5 mg on Tuesday, Thursday, Saturday Sunday        Assessment: 66yo male on chronic Coumadin PTA for mechanical AVR/MVR, afib.  Home dose is listed above.  INR is supratherapeutic on  admission.  Pt was [facility] admitted to [facility].  Goal of Therapy:  INR 2.5-3.5 (per chart records) Monitor platelets by anticoagulation protocol: Yes   Plan:  HOLD Coumadin today INR daily  Valrie Hart A 05/29/2013,2:55 PM

## 2013-05-29 NOTE — ED Notes (Signed)
Pt's SOB became worse with exertion from moving from wheel chair to bed and getting undressed.  Placed pt on 02 at 2 liters and sat increased to 70's.  Increased 02 to 4liters and sat increased to 97%.  EDP aware and instructed to try to turn 02 to 2 or 3 liters.  Pt's sat 93% and labored respirations on 2 liters, increased to 3Liters.  Notified primary RN.

## 2013-05-30 ENCOUNTER — Encounter (HOSPITAL_COMMUNITY): Admission: RE | Payer: Self-pay | Source: Ambulatory Visit

## 2013-05-30 ENCOUNTER — Ambulatory Visit (HOSPITAL_COMMUNITY): Admission: RE | Admit: 2013-05-30 | Payer: Medicare Other | Source: Ambulatory Visit | Admitting: Ophthalmology

## 2013-05-30 DIAGNOSIS — Z954 Presence of other heart-valve replacement: Secondary | ICD-10-CM

## 2013-05-30 DIAGNOSIS — Z951 Presence of aortocoronary bypass graft: Secondary | ICD-10-CM

## 2013-05-30 DIAGNOSIS — R633 Feeding difficulties: Secondary | ICD-10-CM

## 2013-05-30 DIAGNOSIS — E871 Hypo-osmolality and hyponatremia: Secondary | ICD-10-CM

## 2013-05-30 DIAGNOSIS — I214 Non-ST elevation (NSTEMI) myocardial infarction: Secondary | ICD-10-CM

## 2013-05-30 DIAGNOSIS — F17200 Nicotine dependence, unspecified, uncomplicated: Secondary | ICD-10-CM

## 2013-05-30 DIAGNOSIS — E785 Hyperlipidemia, unspecified: Secondary | ICD-10-CM

## 2013-05-30 DIAGNOSIS — J449 Chronic obstructive pulmonary disease, unspecified: Secondary | ICD-10-CM

## 2013-05-30 DIAGNOSIS — Z7901 Long term (current) use of anticoagulants: Secondary | ICD-10-CM

## 2013-05-30 DIAGNOSIS — I472 Ventricular tachycardia: Secondary | ICD-10-CM

## 2013-05-30 DIAGNOSIS — I2510 Atherosclerotic heart disease of native coronary artery without angina pectoris: Secondary | ICD-10-CM

## 2013-05-30 DIAGNOSIS — I471 Supraventricular tachycardia: Secondary | ICD-10-CM

## 2013-05-30 DIAGNOSIS — I5033 Acute on chronic diastolic (congestive) heart failure: Principal | ICD-10-CM

## 2013-05-30 DIAGNOSIS — D649 Anemia, unspecified: Secondary | ICD-10-CM

## 2013-05-30 DIAGNOSIS — I509 Heart failure, unspecified: Secondary | ICD-10-CM

## 2013-05-30 DIAGNOSIS — I10 Essential (primary) hypertension: Secondary | ICD-10-CM

## 2013-05-30 DIAGNOSIS — I7090 Unspecified atherosclerosis: Secondary | ICD-10-CM

## 2013-05-30 LAB — BASIC METABOLIC PANEL
BUN: 46 mg/dL — ABNORMAL HIGH (ref 6–23)
CO2: 26 mEq/L (ref 19–32)
CO2: 27 mEq/L (ref 19–32)
Calcium: 8.9 mg/dL (ref 8.4–10.5)
Chloride: 88 mEq/L — ABNORMAL LOW (ref 96–112)
Chloride: 91 mEq/L — ABNORMAL LOW (ref 96–112)
Creatinine, Ser: 1.26 mg/dL (ref 0.50–1.35)
Creatinine, Ser: 2.18 mg/dL — ABNORMAL HIGH (ref 0.50–1.35)
Glucose, Bld: 97 mg/dL (ref 70–99)
Potassium: 5.1 mEq/L (ref 3.5–5.1)
Sodium: 123 mEq/L — ABNORMAL LOW (ref 135–145)

## 2013-05-30 LAB — CBC
HCT: 26.2 % — ABNORMAL LOW (ref 39.0–52.0)
MCH: 32.9 pg (ref 26.0–34.0)
MCHC: 35.1 g/dL (ref 30.0–36.0)
Platelets: 296 10*3/uL (ref 150–400)
RBC: 2.8 MIL/uL — ABNORMAL LOW (ref 4.22–5.81)
RDW: 16.5 % — ABNORMAL HIGH (ref 11.5–15.5)
WBC: 5.9 10*3/uL (ref 4.0–10.5)

## 2013-05-30 LAB — PROTIME-INR
INR: 3.92 — ABNORMAL HIGH (ref 0.00–1.49)
Prothrombin Time: 36.9 seconds — ABNORMAL HIGH (ref 11.6–15.2)

## 2013-05-30 SURGERY — TREATMENT, USING YAG LASER
Anesthesia: LOCAL | Laterality: Right

## 2013-05-30 MED ORDER — FUROSEMIDE 10 MG/ML IJ SOLN
80.0000 mg | Freq: Once | INTRAMUSCULAR | Status: AC
  Administered 2013-05-30: 80 mg via INTRAVENOUS
  Filled 2013-05-30: qty 8

## 2013-05-30 NOTE — Progress Notes (Signed)
ANTICOAGULATION CONSULT NOTE  Pharmacy Consult for Coumadin  Indication: Mechanical AVR/MVR, Afib  No Known Allergies  Patient Measurements: Height: 5\' 6"  (167.6 cm) Weight: 158 lb 4.6 oz (71.8 kg) IBW/kg (Calculated) : 63.8  Vital Signs: Temp: 98.5 F (36.9 C) (11/25 0749) Temp src: Oral (11/25 0749) BP: 151/53 mmHg (11/25 1000) Pulse Rate: 65 (11/25 1000)  Labs:  Recent Labs  05/29/13 0826 05/29/13 0845 05/30/13 0522  HGB 9.7*  --  9.2*  HCT 27.6*  --  26.2*  PLT 319  --  296  LABPROT  --  41.9* 36.9*  INR  --  4.63* 3.92*  CREATININE 1.22  --  1.26  TROPONINI <0.30  --   --    Estimated Creatinine Clearance: 51.3 ml/min (by C-G formula based on Cr of 1.26).  Medical History: Past Medical History  Diagnosis Date  . Hypertension   . CAD (coronary artery disease) 2003    a. CABG/MVR/AVR-2003 (#25 St. Jude/#21 St. Jude); anticoagulation; b. negative stress nuclear-2005;  c. 01/2013 NSTEMI/Cath/PCI: LM nl, LAD 50p, 40-39m, D1 50ost, LCX 68m, RCA 50/28m (2.5x20 Promus DES), 50d, VG->Diag 100, VG->PDA 100, VG->OM 100, LIMA->LAD ok.  . Epistaxis     requiring cautery & aterial ligation-09/2009  . GERD (gastroesophageal reflux disease)   . Hyperlipemia   . Abnormal LFTs     possible cirrhosis  . Chronic anticoagulation   . Valvular heart disease     a. 2003: MVR/AVR-2003 (#25 St. Jude/#21 St. Jude);  b. 01/2013 Echo: EF 55%, Mech AVR mean grad 12, Mech MVR mean grad 6.  . Tobacco abuse     45 pack years  . Fasting hyperglycemia   . Nephrolithiasis   . Diverticulosis   . Hyponatremia   . Chronic diastolic CHF (congestive heart failure)   . Chronic renal disease   . Hemolytic anemia     history of  . ETOH abuse   . A-fib 01/16/2013    a. on coumadin  . COPD (chronic obstructive pulmonary disease)     CONTROLED   Medications:  Prescriptions prior to admission  Medication Sig Dispense Refill  . acetaminophen (TYLENOL) 325 MG tablet Take 2 tablets (650 mg  total) by mouth every 6 (six) hours as needed. For pain  30 tablet  0  . amLODipine (NORVASC) 10 MG tablet Take 1 tablet (10 mg total) by mouth daily.      Marland Kitchen atorvastatin (LIPITOR) 40 MG tablet Take 40 mg by mouth daily.      . clopidogrel (PLAVIX) 75 MG tablet Take 1 tablet (75 mg total) by mouth daily with breakfast.  30 tablet  4  . ferrous sulfate 325 (65 FE) MG EC tablet Take 325 mg by mouth daily with breakfast.      . folic acid (FOLVITE) 1 MG tablet Take 1 tablet (1 mg total) by mouth daily.  30 tablet  4  . furosemide (LASIX) 40 MG tablet Take 2 tablets (80 mg total) by mouth daily.  60 tablet  6  . HYDROcodone-acetaminophen (LORTAB) 10-500 MG per tablet Take 1 tablet by mouth 4 times daily as needed for pain.       Marland Kitchen levalbuterol (XOPENEX) 0.63 MG/3ML nebulizer solution Take 3 mLs (0.63 mg total) by nebulization every 6 (six) hours as needed for wheezing or shortness of breath.  3 mL  4  . lisinopril (PRINIVIL,ZESTRIL) 20 MG tablet Take 1 tablet (20 mg total) by mouth daily.  30 tablet  6  .  magnesium oxide (MAG-OX) 400 (241.3 MG) MG tablet Take 1 tablet (400 mg total) by mouth daily.  30 tablet  6  . metoprolol (LOPRESSOR) 100 MG tablet Take 1 tablet (100 mg total) by mouth 2 (two) times daily.  60 tablet  6  . Multiple Vitamin (MULTIVITAMIN WITH MINERALS) TABS Take 1 tablet by mouth daily.      . nitroGLYCERIN (NITROSTAT) 0.4 MG SL tablet Place 1 tablet (0.4 mg total) under the tongue every 5 (five) minutes x 3 doses as needed for chest pain.  25 tablet  3  . pantoprazole (PROTONIX) 40 MG tablet Take 1 tablet (40 mg total) by mouth daily.  30 tablet  6  . thiamine 100 MG tablet Take 1 tablet (100 mg total) by mouth daily.  30 tablet  4  . warfarin (COUMADIN) 5 MG tablet Take 5-7.5 mg by mouth See admin instructions. Take 1.5 tab (7.5 mg) monday Wednesday Friday, and 5 mg on Tuesday, Thursday, Saturday Sunday        Assessment: 67yo male on chronic Coumadin PTA for mechanical AVR/MVR,  afib.  Home dose is listed above.  INR was supratherapeutic on admission & remains elevated today despite holding dose.  No bleeding noted.   Goal of Therapy:  INR 2.5-3.5 (per chart records)   Plan:  HOLD Coumadin today INR daily  Elson Clan 05/30/2013,11:02 AM

## 2013-05-30 NOTE — Progress Notes (Signed)
UR chart review completed.  

## 2013-05-30 NOTE — Care Management Note (Addendum)
    Page 1 of 1   05/31/2013     3:56:16 PM   CARE MANAGEMENT NOTE 05/31/2013  Patient:  [name], [name]   Account Number:  [id number]  Date Initiated:  05/30/2013  Documentation initiated by:  Sharrie Rothman  Subjective/Objective Assessment:   Pt admitted from home with CHF. Pt lives alone but has a brother in law which lives next door. Pt is independent with ADL's.     Action/Plan:   No CM needs anticipated.   Anticipated DC Date:  06/01/2013   Anticipated DC Plan:  HOME/SELF CARE      DC Planning Services  CM consult      Choice offered to / List presented to:             Status of service:  Completed, signed off Medicare Important Message given?   (If response is "NO", the following Medicare IM given date fields will be blank) Date Medicare IM given:   Date Additional Medicare IM given:    Discharge Disposition:  HOME/SELF CARE  Per UR Regulation:    If discussed at Long Length of Stay Meetings, dates discussed:    Comments:  05/31/13 1555 Arlyss Queen, RN BSN CM Pt referral made to [facility] per Dr. Juanetta Gosling request. THey will followup with pt at discharge.  05/30/13 1407 Arlyss Queen, RN BSN CM

## 2013-05-30 NOTE — H&P (Signed)
NAMEALYSSA, MANCERA NO.:  [id number]  MEDICAL RECORD NO.:  [id number]  LOCATION:  IC12                          FACILITY:  [facility]  PHYSICIAN:  Tallia Moehring L. Juanetta Gosling, M.D.DATE OF BIRTH:  August 16, 1945  DATE OF ADMISSION:  05/29/2013 DATE OF DISCHARGE:  LH                             HISTORY & PHYSICAL   REASON FOR ADMISSION:  Congestive heart failure, hyponatremia, anemia.  HISTORY:  This is a 67 year old who has a history of coronary artery occlusive disease, bowel replacement, congestive heart failure and who came to the emergency department because of increasing shortness of breath.  There he was found to have what looked like increased congestive heart failure.  He says he has not had any chest pain.  He has no other new complaints.  He started having trouble 3 or 4 days ago. He was treated in the emergency department, found to have low sodium and because of his increasing problems with shortness of breath and his low- sodium, he was admitted.  PAST MEDICAL HISTORY:  Positive for hypertension, coronary artery occlusive disease, previous valvular heart disease with valve replacement with a St. Jude valve, hyperlipidemia, chronic anticoagulation, COPD, chronic diastolic congestive heart failure, chronic renal disease, history of hemolytic anemia, atrial fibrillation on Coumadin, ethanol abuse.  PAST SURGICAL HISTORY:  Surgically, he has had treatment for a nose bleed with sphenopalatine artery ligation.  He has had a inguinal hernia repair, coronary artery bypass grafting, aortic and mitral valve replacements, cataract extraction, colonoscopy, EGD.  FAMILY HISTORY:  Positive for coronary disease.  SOCIAL HISTORY:  He has about a 40-pack year smoking history.  He uses about 1 drink of alcohol daily.  He does not use any illicit drugs.  REVIEW OF SYSTEMS:  Except as mentioned is negative.  He has not noticed much swelling, but has a little.  MEDICATIONS: 1.  Amlodipine 10 mg daily. 2. Lipitor 40 mg daily. 3. Plavix 75 mg daily. 4. Iron 325 mg daily. 5. Folic acid 1 mg daily. 6. Lasix 2 tablets daily. 7. Norco 10/325 as needed for pain. 8. Xopenex in a nebulizer q.i.d. 9. Lisinopril 20 mg daily. 10.Magnesium oxide 400 mg b.i.d. 11.Metoprolol 100 mg b.i.d. 12.Multivitamins daily. 13.Nitroglycerin 0.4 mg as needed. 14.Protonix 40 mg daily. 15.Thiamine 100 mg daily. 16.Coumadin, the dose of which of course varies.  PHYSICAL EXAMINATION:  GENERAL:  He is awake and alert, looks awake, looks comfortable and does not have any neurological abnormalities. VITAL SIGNS:  Blood pressure 118/44, pulse 65, respirations 25, temperature is 97.9. HEENT:  His pupils are reactive.  Nose and throat are clear.  Mucous membranes are moist. NECK:  Supple without masses.  He does not have any JVD. CHEST:  Clear without wheezes. HEART:  Regular.  He has prostatic heart valve sounds.  I think he is in atrial fibrillation. ABDOMEN:  Soft. EXTREMITIES:  Trace edema. CENTRAL NERVOUS SYSTEM:  Grossly intact.  LABORATORY WORK:  White count 7300, hemoglobin is 9.7, hematocrit 27.6. His sodium is down to 117, BUN 29, creatinine 1.22.  Chest x-ray shows CHF.  ASSESSMENT:  He has chronic heart failure.  He has coronary artery occlusive disease and valvular  heart disease.  He has marked hyponatremia which is recurrent.  He has a history of alcohol abuse.  PLAN:  I am going to go ahead and give him Lasix.  He is going to be on a heparin lock IV.  He will continue all of his other medicines. Recheck his labs in the morning.  He will have Cardiology consultation.     Tekela Garguilo L. Juanetta Gosling, M.D.     ELH/MEDQ  D:  05/29/2013  T:  05/30/2013  Job:  [id number]

## 2013-05-30 NOTE — Consult Note (Signed)
CARDIOLOGY CONSULT NOTE  Patient ID: [name] MRN: [id number] DOB/AGE: 1945/11/24 67 y.o.  Admit date: 05/29/2013 Primary Physician Fredirick Maudlin, MD  Reason for Consultation: CHF  HPI: The patient is a 67 year old male with a past medical history significant for coronary artery bypass graft surgery (3 occluded vein grafts and patent LIMA to LAD), relatively recent percutaneous coronary intervention of the mid right coronary artery with a drug-eluting stent in 01/2013, chronic diastolic heart failure, mechanical aortic and mitral valve replacements, atrial fibrillation, nonsustained ventricular tachycardia, supraventricular tachycardia, hyperlipidemia and hypertension, hypomagnesemia, as well as severe COPD. In mid-July, he was admitted to [facility] with a NSTEMI and underwent DES to the mid RCA. Later that month, he was readmitted with acute on chronic diastolic heart failure. He was noted to be noncompliant with a low-sodium diet, and also had ongoing problems with both alcohol and tobacco abuse. His most recent echocardiogram is from 01-18-13 which revealed  normal left ventricular systolic function, indeterminate diastolic function, and normally functioning mechanical mitral and aortic valves.   He had been developing increasing shortness of breath for 3-4 days prior to hospitalization. He denies chest pain. He also denies palpitations, dizziness, lightheadedness and syncope. He denies orthopnea and PND, and says he has been sleeping with 2 pillows which is normal for him. He said he does not cook at home for himself, and always eat out. If he does eat at home, he eats canned vegetables such as canned green beans and canned pinto beans. He says he eats pizza and pizza burgers quite frequently, as well as fried chicken wings. He has cut back his smoking to 2-3 packs every 7 days.  Since receiving a single dose of IV furosemide 80 mg last night, he feels his shortness of breath  has markedly improved. His sodium was initially 117 and then increased to 123 early this morning.    No Known Allergies  Current Facility-Administered Medications  Medication Dose Route Frequency Provider Last Rate Last Dose  . 0.9 %  sodium chloride infusion  250 mL Intravenous PRN Fredirick Maudlin, MD      . acetaminophen (TYLENOL) tablet 650 mg  650 mg Oral Q6H PRN Fredirick Maudlin, MD       Or  . acetaminophen (TYLENOL) suppository 650 mg  650 mg Rectal Q6H PRN Fredirick Maudlin, MD      . amLODipine (NORVASC) tablet 10 mg  10 mg Oral Daily Fredirick Maudlin, MD   10 mg at 05/30/13 1001  . atorvastatin (LIPITOR) tablet 40 mg  40 mg Oral Daily Fredirick Maudlin, MD   40 mg at 05/30/13 1001  . clopidogrel (PLAVIX) tablet 75 mg  75 mg Oral Q breakfast Fredirick Maudlin, MD   75 mg at 05/30/13 0815  . ferrous sulfate tablet 325 mg  325 mg Oral Q breakfast Fredirick Maudlin, MD   325 mg at 05/30/13 0815  . folic acid (FOLVITE) tablet 1 mg  1 mg Oral Daily Fredirick Maudlin, MD   1 mg at 05/30/13 1001  . HYDROcodone-acetaminophen (NORCO/VICODIN) 5-325 MG per tablet 1-2 tablet  1-2 tablet Oral Q4H PRN Fredirick Maudlin, MD      . levalbuterol [facility]) nebulizer solution 0.63 mg  0.63 mg Nebulization Q6H PRN Fredirick Maudlin, MD      . lisinopril (PRINIVIL,ZESTRIL) tablet 20 mg  20 mg Oral Daily Fredirick Maudlin, MD   20 mg at 05/30/13  1000  . magnesium oxide (MAG-OX) tablet 400 mg  400 mg Oral Daily Fredirick Maudlin, MD   400 mg at 05/30/13 1001  . metoprolol (LOPRESSOR) tablet 100 mg  100 mg Oral BID Fredirick Maudlin, MD   100 mg at 05/30/13 1001  . morphine 2 MG/ML injection 2 mg  2 mg Intravenous Q2H PRN Fredirick Maudlin, MD      . multivitamin with minerals tablet 1 tablet  1 tablet Oral Daily Fredirick Maudlin, MD   1 tablet at 05/30/13 1001  . nitroGLYCERIN (NITROSTAT) SL tablet 0.4 mg  0.4 mg Sublingual Q5 Min x 3 PRN Fredirick Maudlin, MD      . ondansetron [facility]) tablet 4 mg  4 mg Oral Q6H  PRN Fredirick Maudlin, MD       Or  . ondansetron [facility]) injection 4 mg  4 mg Intravenous Q6H PRN Fredirick Maudlin, MD      . pantoprazole (PROTONIX) EC tablet 40 mg  40 mg Oral Daily Fredirick Maudlin, MD   40 mg at 05/30/13 1001  . sodium chloride 0.9 % injection 3 mL  3 mL Intravenous Q12H Fredirick Maudlin, MD      . sodium chloride 0.9 % injection 3 mL  3 mL Intravenous Q12H Fredirick Maudlin, MD   3 mL at 05/30/13 1000  . sodium chloride 0.9 % injection 3 mL  3 mL Intravenous PRN Fredirick Maudlin, MD   3 mL at 05/29/13 1518  . thiamine (VITAMIN B-1) tablet 100 mg  100 mg Oral Daily Fredirick Maudlin, MD   100 mg at 05/30/13 1001  . traZODone (DESYREL) tablet 25 mg  25 mg Oral QHS PRN Fredirick Maudlin, MD      . Warfarin - Pharmacist Dosing Inpatient   Does not apply Q24H Fredirick Maudlin, MD        Past Medical History  Diagnosis Date  . Hypertension   . CAD (coronary artery disease) 2003    a. CABG/MVR/AVR-2003 (#25 St. Jude/#21 St. Jude); anticoagulation; b. negative stress nuclear-2005;  c. 01/2013 NSTEMI/Cath/PCI: LM nl, LAD 50p, 40-34m, D1 50ost, LCX 48m, RCA 50/61m (2.5x20 Promus DES), 50d, VG->Diag 100, VG->PDA 100, VG->OM 100, LIMA->LAD ok.  . Epistaxis     requiring cautery & aterial ligation-09/2009  . GERD (gastroesophageal reflux disease)   . Hyperlipemia   . Abnormal LFTs     possible cirrhosis  . Chronic anticoagulation   . Valvular heart disease     a. 2003: MVR/AVR-2003 (#25 St. Jude/#21 St. Jude);  b. 01/2013 Echo: EF 55%, Mech AVR mean grad 12, Mech MVR mean grad 6.  . Tobacco abuse     45 pack years  . Fasting hyperglycemia   . Nephrolithiasis   . Diverticulosis   . Hyponatremia   . Chronic diastolic CHF (congestive heart failure)   . Chronic renal disease   . Hemolytic anemia     history of  . ETOH abuse   . A-fib 01/16/2013    a. on coumadin  . COPD (chronic obstructive pulmonary disease)     CONTROLED    Past Surgical History  Procedure Laterality  Date  . Endocopic sphenopalatine artery ligation & cautry    . Inguinal hernia repair      Left & right  . Coronary artery bypass graft  11/2001    [facility]  . Cardiac valve replacement  11/2001    AVR and  MVR-St. Jude devices  . Cataract extraction w/phaco  01/20/2011    Procedure: CATARACT EXTRACTION PHACO AND INTRAOCULAR LENS PLACEMENT (IOC);  Surgeon: Loraine Leriche T. Nile Riggs;  Location: [facility] ORS;  Service: Ophthalmology;  Laterality: Right;  CDE: 8.51  . Cataract extraction w/phaco  02/03/2011    Procedure: CATARACT EXTRACTION PHACO AND INTRAOCULAR LENS PLACEMENT (IOC);  Surgeon: Loraine Leriche T. Nile Riggs;  Location: [facility] ORS;  Service: Ophthalmology;  Laterality: Left;  CDE:10.01  . Colonoscopy  04/05/02; 08/2011    friable anal canal hemorrhoids otherwise normal; 2 diminutive polyps excised, minimal diverticulosis noted  . Esophagogastroduodenoscopy  01/2002    Dr. Karilyn Cota, submucosal esophageal lesion c/w leiomyoma  . Colonoscopy  09/02/2011    Procedure: COLONOSCOPY;  Surgeon: Corbin Ade, MD;  Location: [facility] ENDO SUITE;  Service: Endoscopy;  Laterality: N/A;  8:15    History   Social History  . Marital Status: Widowed    Spouse Name: N/A    Number of Children: 1  . Years of Education: N/A   Occupational History  . retired    Social History Main Topics  . Smoking status: Current Every Day Smoker -- 0.25 packs/day for 52 years    Types: Cigarettes  . Smokeless tobacco: Never Used     Comment: STATES HE IS CUTTING DOWN now only 1/2 ppd (01/27/2013)  . Alcohol Use: 4.2 oz/week    6 Cans of beer, 1 Shots of liquor per week     Comment: DAILY  . Drug Use: No  . Sexual Activity: No   Other Topics Concern  . Not on file   Social History Narrative   Lives in [place] by himself.  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     Family History  Problem Relation Age of Onset  . Hypotension Neg Hx   . Anesthesia problems Neg Hx   . Malignant hyperthermia Neg Hx   .  Pseudochol deficiency Neg Hx   . Colon cancer Neg Hx   . Liver disease Neg Hx      Prior to Admission medications   Medication Sig Start Date End Date Taking? Authorizing Provider  acetaminophen (TYLENOL) 325 MG tablet Take 2 tablets (650 mg total) by mouth every 6 (six) hours as needed. For pain 01/22/13  Yes Brooke O Edmisten, PA-C  amLODipine (NORVASC) 10 MG tablet Take 1 tablet (10 mg total) by mouth daily. 01/22/13  Yes Brooke O Edmisten, PA-C  atorvastatin (LIPITOR) 40 MG tablet Take 40 mg by mouth daily.   Yes Historical Provider, MD  clopidogrel (PLAVIX) 75 MG tablet Take 1 tablet (75 mg total) by mouth daily with breakfast. 01/22/13  Yes Brooke O Edmisten, PA-C  ferrous sulfate 325 (65 FE) MG EC tablet Take 325 mg by mouth daily with breakfast.   Yes Historical Provider, MD  folic acid (FOLVITE) 1 MG tablet Take 1 tablet (1 mg total) by mouth daily. 01/22/13  Yes Brooke O Edmisten, PA-C  furosemide (LASIX) 40 MG tablet Take 2 tablets (80 mg total) by mouth daily. 01/31/13 09/24/14 Yes Ok Anis, NP  HYDROcodone-acetaminophen (LORTAB) 10-500 MG per tablet Take 1 tablet by mouth 4 times daily as needed for pain.  03/07/12  Yes Historical Provider, MD  levalbuterol Pauline Aus) 0.63 MG/3ML nebulizer solution Take 3 mLs (0.63 mg total) by nebulization every 6 (six) hours as needed for wheezing or shortness of breath. 01/22/13  Yes Brooke O Edmisten, PA-C  lisinopril (PRINIVIL,ZESTRIL) 20 MG tablet Take  1 tablet (20 mg total) by mouth daily. 01/31/13  Yes Ok Anis, NP  magnesium oxide (MAG-OX) 400 (241.3 MG) MG tablet Take 1 tablet (400 mg total) by mouth daily. 01/31/13  Yes Ok Anis, NP  metoprolol (LOPRESSOR) 100 MG tablet Take 1 tablet (100 mg total) by mouth 2 (two) times daily. 03/30/13  Yes Jodelle Gross, NP  Multiple Vitamin (MULTIVITAMIN WITH MINERALS) TABS Take 1 tablet by mouth daily. 01/22/13  Yes Brooke O Edmisten, PA-C  nitroGLYCERIN (NITROSTAT) 0.4 MG SL  tablet Place 1 tablet (0.4 mg total) under the tongue every 5 (five) minutes x 3 doses as needed for chest pain. 01/31/13  Yes Ok Anis, NP  pantoprazole (PROTONIX) 40 MG tablet Take 1 tablet (40 mg total) by mouth daily. 01/31/13  Yes Ok Anis, NP  thiamine 100 MG tablet Take 1 tablet (100 mg total) by mouth daily. 01/22/13  Yes Brooke O Edmisten, PA-C  warfarin (COUMADIN) 5 MG tablet Take 5-7.5 mg by mouth See admin instructions. Take 1.5 tab (7.5 mg) monday Wednesday Friday, and 5 mg on Tuesday, Thursday, Saturday Sunday   Yes Historical Provider, MD     Review of systems complete and found to be negative unless listed above in HPI     Physical exam Blood pressure 104/60, pulse 60, temperature 97.8 F (36.6 C), temperature source Oral, resp. rate 18, height 5\' 6"  (1.676 m), weight 158 lb 4.6 oz (71.8 kg), SpO2 100.00%. General: NAD Neck: No JVD, no thyromegaly or thyroid nodule.  Lungs: No crackles appreciated, prolonged expiratory phase with faint expiratory wheezes bilaterally. CV: Nondisplaced PMI.  Heart regular, prosthetic S1/S2, no S3/S4, no murmur.  Trace left leg peripheral edema.  No carotid bruit.  Normal pedal pulses.  Abdomen: Soft, nontender, no hepatosplenomegaly, no distention.  Skin: Intact without lesions or rashes.  Neurologic: Alert and oriented x 3.  Psych: Normal affect. Extremities: No clubbing or cyanosis.  HEENT: Normal.   Labs:   Lab Results  Component Value Date   WBC 5.9 05/30/2013   HGB 9.2* 05/30/2013   HCT 26.2* 05/30/2013   MCV 93.6 05/30/2013   PLT 296 05/30/2013    Recent Labs Lab 05/29/13 0826 05/30/13 0522  NA 117* 123*  K 4.8 5.1  CL 82* 88*  CO2 25 26  BUN 29* 34*  CREATININE 1.22 1.26  CALCIUM 9.2 9.4  PROT 7.6  --   BILITOT 0.8  --   ALKPHOS 137*  --   ALT 14  --   AST 23  --   GLUCOSE 107* 118*   Lab Results  Component Value Date   CKTOTAL 206 09/04/2009   CKMB 3.5 09/04/2009   TROPONINI <0.30  05/29/2013    Lab Results  Component Value Date   CHOL 147 01/28/2013   CHOL 171 05/18/2011   CHOL 191 02/24/2010   Lab Results  Component Value Date   HDL 64 01/28/2013   HDL 55 16/04/9603   HDL 71 02/24/2010   Lab Results  Component Value Date   LDLCALC 74 01/28/2013   LDLCALC 106* 05/18/2011   LDLCALC 106* 02/24/2010   Lab Results  Component Value Date   TRIG 47 01/28/2013   TRIG 49 05/18/2011   TRIG 72 02/24/2010   Lab Results  Component Value Date   CHOLHDL 2.3 01/28/2013   CHOLHDL 3.1 05/18/2011   CHOLHDL 2.7 Ratio 02/24/2010   No results found for this basename: LDLDIRECT  EKG: Sinus rhythm, rate 67 bpm, nonspecific T wave abnormality in lateral leads.  Studies: Dg Chest Portable 1 View  05/29/2013   CLINICAL DATA:  Shortness of breath  EXAM: PORTABLE CHEST - 1 VIEW  COMPARISON:  01/29/2013  FINDINGS: Heart is enlarged. Prior coronary bypass changes and prosthetic valve replacement noted. New diffuse bilateral mixed interstitial and airspace opacities, similar to several prior exams compatible with CHF. No large effusion or pneumothorax. Trachea midline. Atherosclerosis of the aorta evident.  IMPRESSION: Diffuse mixed interstitial and airspace process compatible with recurrent CHF   Electronically Signed   By: Ruel Favors M.D.   On: 05/29/2013 08:33   Cath in 01/2013: 1. Significant three-vessel coronary artery disease with occluded vein grafts. Patent LIMA to LAD. The culprit for non-ST elevation myocardial infarction is likely occluded SVG to diagonal. However, the native diagonal is large and does not seem to have obstructive disease. There is significant disease in proximal OM 3. However, the vessel is tortuous and calcified. Severe 99% stenosis in mid RCA at the bifurcation with a large RV branch.  2. Successful angioplasty and drug-eluting stent placement to the mid RCA.   ECHO (01-18-13): - Left ventricle: The cavity size was normal. Wall thickness was  increased in a pattern of mild LVH. The estimated ejection fraction was 55%. Wall motion was normal; there were no regional wall motion abnormalities. Indeterminant diastolic function. - Aortic valve: Mechanical aortic valve prosthesis. No prosthetic valve stenosis or significant regurgitation. Mean gradient: 12mm Hg (S). - Mitral valve: Mechanical mitral valve prosthesis. There does not appear to be significant regurgitation and there is no significant prosthetic valve stenosis. Pressure half-time: 68ms. Mean gradient: 6mm Hg (D). - Left atrium: The atrium was moderately dilated. - Right ventricle: The cavity size was normal. Systolic function was normal. - Right atrium: The atrium was mildly dilated. - Pulmonary arteries: No complete TR doppler jet so unable to estimate PA systolic pressure. - Inferior vena cava: The vessel was normal in size; the respirophasic diameter changes were in the normal range (= 50%); findings are consistent with normal central venous pressure. Impressions:  - Normal LV size and systolic function with mild LV hypertrophy. EF 55%. Normal RV size and systolic function. Mechanical aortic and mitral valves appeared to function normally.     ASSESSMENT AND PLAN:  1. Acute on chronic diastolic heart failure: this appears to be due to dietary indiscretion, which is a recurring problem for him. Unfortunately, he lives by himself and does not see the need to cook for himself. His ECG reveals a nonspecific T wave abnormality and one troponin that was checked was normal. At this time, I do not feel that additional troponins need to be checked. He previously had documented normal left ventricle systolic function and indeterminate diastolic function in 01/2013. Given his sodium of 123 earlier this morning, I will repeat one dose of IV Lasix 80 mg and check a BMET, which I will also assess daily given his hyponatremia. His BP is well controlled. 2. CAD s/p CABG with AVR  and MVR: this appears to be stable on current therapy, which includes metoprolol, Lipitor, lisinopril, and Plavix. He remains anticoagulated on warfarin. 3. HTN: controlled on current therapy. 4. Hyperlipidemia: lipids on 01/28/13 were within normal limits. Continue current dose of Lipitor. LDL 74 and HDL 64. 5. Atrial fibrillation: currently in normal sinus rhythm.  6. NSVT/SVT: stable. If this recurs, would check magnesium levels given his h/o hypomagnesemia. 7. Hyponatremia: workup for  SIADH ongoing. Heart failure may be contributing to this as well. This should improve with furosemide use, as it will help serve to decrease the concentration gradient in the renal medulla, thereby reducing the driving force for water reabsorption in the collecting duct. 8. 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 He may benefit from consultation with [facility].  Signed: Prentice Docker, M.D., F.A.C.C.  05/30/2013, 7:30 PM

## 2013-05-30 NOTE — Progress Notes (Signed)
Subjective: He says he feels better. He has no new complaints. He says his breathing is better  Objective: Vital signs in last 24 hours: Temp:  [97.4 F (36.3 C)-98.5 F (36.9 C)] 98.5 F (36.9 C) (11/25 0749) Pulse Rate:  [27-92] 62 (11/25 0500) Resp:  [14-25] 17 (11/25 0500) BP: (114-162)/(43-136) 147/94 mmHg (11/25 0400) SpO2:  [85 %-100 %] 100 % (11/25 0500) Weight:  [71.4 kg (157 lb 6.5 oz)-71.8 kg (158 lb 4.6 oz)] 71.8 kg (158 lb 4.6 oz) (11/25 0500) Weight change:  Last BM Date: 05/28/13  Intake/Output from previous day: 11/24 0701 - 11/25 0700 In: 840 [P.O.:840] Out: 1700 [Urine:1700]  PHYSICAL EXAM General appearance: alert, cooperative and mild distress Resp: rales bibasilar Cardio: Prosthetic heart valve sounds GI: soft, non-tender; bowel sounds normal; no masses,  no organomegaly Extremities: His edema is improved  Lab Results:    Basic Metabolic Panel:  Recent Labs  16/10/96 0826 05/30/13 0522  NA 117* 123*  K 4.8 5.1  CL 82* 88*  CO2 25 26  GLUCOSE 107* 118*  BUN 29* 34*  CREATININE 1.22 1.26  CALCIUM 9.2 9.4   Liver Function Tests:  Recent Labs  05/29/13 0826  AST 23  ALT 14  ALKPHOS 137*  BILITOT 0.8  PROT 7.6  ALBUMIN 3.8   No results found for this basename: LIPASE, AMYLASE,  in the last 72 hours No results found for this basename: AMMONIA,  in the last 72 hours CBC:  Recent Labs  05/29/13 0826 05/30/13 0522  WBC 7.3 5.9  NEUTROABS 6.1  --   HGB 9.7* 9.2*  HCT 27.6* 26.2*  MCV 92.9 93.6  PLT 319 296   Cardiac Enzymes:  Recent Labs  05/29/13 0826  TROPONINI <0.30   BNP:  Recent Labs  05/29/13 0826  PROBNP 6106.0*   D-Dimer: No results found for this basename: DDIMER,  in the last 72 hours CBG: No results found for this basename: GLUCAP,  in the last 72 hours Hemoglobin A1C: No results found for this basename: HGBA1C,  in the last 72 hours Fasting Lipid Panel: No results found for this basename: CHOL, HDL,  LDLCALC, TRIG, CHOLHDL, LDLDIRECT,  in the last 72 hours Thyroid Function Tests: No results found for this basename: TSH, T4TOTAL, FREET4, T3FREE, THYROIDAB,  in the last 72 hours Anemia Panel: No results found for this basename: VITAMINB12, FOLATE, FERRITIN, TIBC, IRON, RETICCTPCT,  in the last 72 hours Coagulation:  Recent Labs  05/29/13 0845 05/30/13 0522  LABPROT 41.9* 36.9*  INR 4.63* 3.92*   Urine Drug Screen: Drugs of Abuse  No results found for this basename: labopia, cocainscrnur, labbenz, amphetmu, thcu, labbarb    Alcohol Level: No results found for this basename: ETH,  in the last 72 hours Urinalysis: No results found for this basename: COLORURINE, APPERANCEUR, LABSPEC, PHURINE, GLUCOSEU, HGBUR, BILIRUBINUR, KETONESUR, PROTEINUR, UROBILINOGEN, NITRITE, LEUKOCYTESUR,  in the last 72 hours Misc. Labs:  ABGS No results found for this basename: PHART, PCO2, PO2ART, TCO2, HCO3,  in the last 72 hours CULTURES Recent Results (from the past 240 hour(s))  MRSA PCR SCREENING     Status: None   Collection Time    05/29/13  3:47 PM      Result Value Range Status   MRSA by PCR NEGATIVE  NEGATIVE Final   Comment:            The GeneXpert MRSA Assay (FDA     approved for NASAL specimens  only), is one component of a     comprehensive MRSA colonization     surveillance program. It is not     intended to diagnose MRSA     infection nor to guide or     monitor treatment for     MRSA infections.   Studies/Results: Dg Chest Portable 1 View  05/29/2013   CLINICAL DATA:  Shortness of breath  EXAM: PORTABLE CHEST - 1 VIEW  COMPARISON:  01/29/2013  FINDINGS: Heart is enlarged. Prior coronary bypass changes and prosthetic valve replacement noted. New diffuse bilateral mixed interstitial and airspace opacities, similar to several prior exams compatible with CHF. No large effusion or pneumothorax. Trachea midline. Atherosclerosis of the aorta evident.  IMPRESSION: Diffuse mixed  interstitial and airspace process compatible with recurrent CHF   Electronically Signed   By: Ruel Favors M.D.   On: 05/29/2013 08:33    Medications:  Prior to Admission:  Prescriptions prior to admission  Medication Sig Dispense Refill  . acetaminophen (TYLENOL) 325 MG tablet Take 2 tablets (650 mg total) by mouth every 6 (six) hours as needed. For pain  30 tablet  0  . amLODipine (NORVASC) 10 MG tablet Take 1 tablet (10 mg total) by mouth daily.      Marland Kitchen atorvastatin (LIPITOR) 40 MG tablet Take 40 mg by mouth daily.      . clopidogrel (PLAVIX) 75 MG tablet Take 1 tablet (75 mg total) by mouth daily with breakfast.  30 tablet  4  . ferrous sulfate 325 (65 FE) MG EC tablet Take 325 mg by mouth daily with breakfast.      . folic acid (FOLVITE) 1 MG tablet Take 1 tablet (1 mg total) by mouth daily.  30 tablet  4  . furosemide (LASIX) 40 MG tablet Take 2 tablets (80 mg total) by mouth daily.  60 tablet  6  . HYDROcodone-acetaminophen (LORTAB) 10-500 MG per tablet Take 1 tablet by mouth 4 times daily as needed for pain.       Marland Kitchen levalbuterol (XOPENEX) 0.63 MG/3ML nebulizer solution Take 3 mLs (0.63 mg total) by nebulization every 6 (six) hours as needed for wheezing or shortness of breath.  3 mL  4  . lisinopril (PRINIVIL,ZESTRIL) 20 MG tablet Take 1 tablet (20 mg total) by mouth daily.  30 tablet  6  . magnesium oxide (MAG-OX) 400 (241.3 MG) MG tablet Take 1 tablet (400 mg total) by mouth daily.  30 tablet  6  . metoprolol (LOPRESSOR) 100 MG tablet Take 1 tablet (100 mg total) by mouth 2 (two) times daily.  60 tablet  6  . Multiple Vitamin (MULTIVITAMIN WITH MINERALS) TABS Take 1 tablet by mouth daily.      . nitroGLYCERIN (NITROSTAT) 0.4 MG SL tablet Place 1 tablet (0.4 mg total) under the tongue every 5 (five) minutes x 3 doses as needed for chest pain.  25 tablet  3  . pantoprazole (PROTONIX) 40 MG tablet Take 1 tablet (40 mg total) by mouth daily.  30 tablet  6  . thiamine 100 MG tablet Take 1  tablet (100 mg total) by mouth daily.  30 tablet  4  . warfarin (COUMADIN) 5 MG tablet Take 5-7.5 mg by mouth See admin instructions. Take 1.5 tab (7.5 mg) monday Wednesday Friday, and 5 mg on Tuesday, Thursday, Saturday Sunday       Scheduled: . amLODipine  10 mg Oral Daily  . atorvastatin  40 mg Oral Daily  . clopidogrel  75 mg Oral Q breakfast  . ferrous sulfate  325 mg Oral Q breakfast  . folic acid  1 mg Oral Daily  . influenza vac split quadrivalent PF  0.5 mL Intramuscular Tomorrow-1000  . lisinopril  20 mg Oral Daily  . magnesium oxide  400 mg Oral Daily  . metoprolol  100 mg Oral BID  . multivitamin with minerals  1 tablet Oral Daily  . pantoprazole  40 mg Oral Daily  . sodium chloride  3 mL Intravenous Q12H  . sodium chloride  3 mL Intravenous Q12H  . thiamine  100 mg Oral Daily  . Warfarin - Pharmacist Dosing Inpatient   Does not apply Q24H   Continuous:  ZOX:WRUEAV chloride, acetaminophen, acetaminophen, HYDROcodone-acetaminophen, levalbuterol, morphine injection, nitroGLYCERIN, ondansetron (ZOFRAN) IV, ondansetron, sodium chloride, traZODone  Assesment: He was admitted with hyponatremia. This is a recurrent problem. He will be checked for SIADH but I'm not sure how accurate that we'll be because he is been on Lasix. His sodium is better. He has congestive heart failure and mild pulmonary edema on admission. He has pretty severe COPD so he could have SIADH from that. He is more anemic. Active Problems:   * No active hospital problems. *    Plan: Check random urine, have him check stools for blood cardiology consultation to help with diuresis    LOS: 1 day   Wanetta Funderburke L 05/30/2013, 8:50 AM

## 2013-05-31 LAB — BASIC METABOLIC PANEL
BUN: 44 mg/dL — ABNORMAL HIGH (ref 6–23)
CO2: 28 mEq/L (ref 19–32)
Calcium: 9.2 mg/dL (ref 8.4–10.5)
Chloride: 90 mEq/L — ABNORMAL LOW (ref 96–112)
Creatinine, Ser: 1.61 mg/dL — ABNORMAL HIGH (ref 0.50–1.35)
GFR calc non Af Amer: 43 mL/min — ABNORMAL LOW (ref >90)
Glucose, Bld: 100 mg/dL — ABNORMAL HIGH (ref 70–99)
Sodium: 126 mEq/L — ABNORMAL LOW (ref 135–145)

## 2013-05-31 MED ORDER — WARFARIN SODIUM 7.5 MG PO TABS
7.5000 mg | ORAL_TABLET | Freq: Once | ORAL | Status: AC
  Administered 2013-05-31: 7.5 mg via ORAL
  Filled 2013-05-31: qty 1

## 2013-05-31 NOTE — Progress Notes (Addendum)
SUBJECTIVE: Patient feels much better and completely denies shortness of breath. He says "my legs are about as normal as they can be".     Intake/Output Summary (Last 24 hours) at 05/31/13 1401 Last data filed at 05/31/13 0800  Gross per 24 hour  Intake   1680 ml  Output    400 ml  Net   1280 ml    Current Facility-Administered Medications  Medication Dose Route Frequency Provider Last Rate Last Dose  . 0.9 %  sodium chloride infusion  250 mL Intravenous PRN Fredirick Maudlin, MD      . acetaminophen (TYLENOL) tablet 650 mg  650 mg Oral Q6H PRN Fredirick Maudlin, MD       Or  . acetaminophen (TYLENOL) suppository 650 mg  650 mg Rectal Q6H PRN Fredirick Maudlin, MD      . amLODipine (NORVASC) tablet 10 mg  10 mg Oral Daily Fredirick Maudlin, MD   10 mg at 05/31/13 1015  . atorvastatin (LIPITOR) tablet 40 mg  40 mg Oral Daily Fredirick Maudlin, MD   40 mg at 05/31/13 1015  . clopidogrel (PLAVIX) tablet 75 mg  75 mg Oral Q breakfast Fredirick Maudlin, MD   75 mg at 05/31/13 0813  . ferrous sulfate tablet 325 mg  325 mg Oral Q breakfast Fredirick Maudlin, MD   325 mg at 05/31/13 0813  . folic acid (FOLVITE) tablet 1 mg  1 mg Oral Daily Fredirick Maudlin, MD   1 mg at 05/31/13 1015  . HYDROcodone-acetaminophen (NORCO/VICODIN) 5-325 MG per tablet 1-2 tablet  1-2 tablet Oral Q4H PRN Fredirick Maudlin, MD   1 tablet at 05/30/13 2344  . levalbuterol (XOPENEX) nebulizer solution 0.63 mg  0.63 mg Nebulization Q6H PRN Fredirick Maudlin, MD      . lisinopril (PRINIVIL,ZESTRIL) tablet 20 mg  20 mg Oral Daily Fredirick Maudlin, MD   20 mg at 05/31/13 1017  . magnesium oxide (MAG-OX) tablet 400 mg  400 mg Oral Daily Fredirick Maudlin, MD   400 mg at 05/31/13 1015  . metoprolol (LOPRESSOR) tablet 100 mg  100 mg Oral BID Fredirick Maudlin, MD   100 mg at 05/31/13 1015  . morphine 2 MG/ML injection 2 mg  2 mg Intravenous Q2H PRN Fredirick Maudlin, MD      . multivitamin with minerals tablet 1 tablet  1  tablet Oral Daily Fredirick Maudlin, MD   1 tablet at 05/31/13 1015  . nitroGLYCERIN (NITROSTAT) SL tablet 0.4 mg  0.4 mg Sublingual Q5 Min x 3 PRN Fredirick Maudlin, MD      . ondansetron [facility]) tablet 4 mg  4 mg Oral Q6H PRN Fredirick Maudlin, MD       Or  . ondansetron [facility]) injection 4 mg  4 mg Intravenous Q6H PRN Fredirick Maudlin, MD      . pantoprazole (PROTONIX) EC tablet 40 mg  40 mg Oral Daily Fredirick Maudlin, MD   40 mg at 05/31/13 1015  . sodium chloride 0.9 % injection 3 mL  3 mL Intravenous Q12H Fredirick Maudlin, MD      . sodium chloride 0.9 % injection 3 mL  3 mL Intravenous Q12H Fredirick Maudlin, MD   3 mL at 05/31/13 1016  . sodium chloride 0.9 % injection 3 mL  3 mL Intravenous PRN Fredirick Maudlin, MD   3 mL at  05/29/13 1518  . thiamine (VITAMIN B-1) tablet 100 mg  100 mg Oral Daily Fredirick Maudlin, MD   100 mg at 05/31/13 1015  . traZODone (DESYREL) tablet 25 mg  25 mg Oral QHS PRN Fredirick Maudlin, MD      . warfarin (COUMADIN) tablet 7.5 mg  7.5 mg Oral Once Fredirick Maudlin, MD      . Warfarin - Pharmacist Dosing Inpatient   Does not apply Q24H Fredirick Maudlin, MD        Filed Vitals:   05/31/13 0800 05/31/13 0900 05/31/13 1000 05/31/13 1015  BP: 138/106 134/82 140/56 140/61  Pulse: 62 58 60 61  Temp:      TempSrc:      Resp:   17   Height:      Weight:      SpO2: 100% 100% 100%     PHYSICAL EXAM General: NAD  Neck: No JVD, no thyromegaly or thyroid nodule.  Lungs: No crackles appreciated, prolonged expiratory phase with faint expiratory wheezes bilaterally.  CV: Nondisplaced PMI. Heart regular, prosthetic S1/S2, no S3/S4, no murmur. Trace left leg peripheral edema with overall wrinkled appearance. No carotid bruit. Normal pedal pulses.  Abdomen: Soft, nontender, no hepatosplenomegaly, no distention.  Skin: Intact without lesions or rashes.  Neurologic: Alert and oriented x 3.  Psych: Normal affect.  Extremities: No clubbing or cyanosis.  HEENT:  Normal.   TELEMETRY: Reviewed telemetry pt in sinus rhythm.  LABS: Basic Metabolic Panel:  Recent Labs  95/62/13 2100 05/31/13 0559  NA 126* 126*  K 5.9* 5.1  CL 91* 90*  CO2 27 28  GLUCOSE 97 100*  BUN 46* 44*  CREATININE 2.18* 1.61*  CALCIUM 8.9 9.2   Liver Function Tests:  Recent Labs  05/29/13 0826  AST 23  ALT 14  ALKPHOS 137*  BILITOT 0.8  PROT 7.6  ALBUMIN 3.8   No results found for this basename: LIPASE, AMYLASE,  in the last 72 hours CBC:  Recent Labs  05/29/13 0826 05/30/13 0522  WBC 7.3 5.9  NEUTROABS 6.1  --   HGB 9.7* 9.2*  HCT 27.6* 26.2*  MCV 92.9 93.6  PLT 319 296   Cardiac Enzymes:  Recent Labs  05/29/13 0826  TROPONINI <0.30   BNP: No components found with this basename: POCBNP,  D-Dimer: No results found for this basename: DDIMER,  in the last 72 hours Hemoglobin A1C: No results found for this basename: HGBA1C,  in the last 72 hours Fasting Lipid Panel: No results found for this basename: CHOL, HDL, LDLCALC, TRIG, CHOLHDL, LDLDIRECT,  in the last 72 hours Thyroid Function Tests: No results found for this basename: TSH, T4TOTAL, FREET3, T3FREE, THYROIDAB,  in the last 72 hours Anemia Panel: No results found for this basename: VITAMINB12, FOLATE, FERRITIN, TIBC, IRON, RETICCTPCT,  in the last 72 hours  RADIOLOGY: Dg Chest Portable 1 View  05/29/2013   CLINICAL DATA:  Shortness of breath  EXAM: PORTABLE CHEST - 1 VIEW  COMPARISON:  01/29/2013  FINDINGS: Heart is enlarged. Prior coronary bypass changes and prosthetic valve replacement noted. New diffuse bilateral mixed interstitial and airspace opacities, similar to several prior exams compatible with CHF. No large effusion or pneumothorax. Trachea midline. Atherosclerosis of the aorta evident.  IMPRESSION: Diffuse mixed interstitial and airspace process compatible with recurrent CHF   Electronically Signed   By: Ruel Favors M.D.   On: 05/29/2013 08:33      ASSESSMENT AND  PLAN: 1. Acute  on chronic diastolic heart failure: this appears to be due to dietary indiscretion, which is a recurring problem for him. Unfortunately, he lives by himself and does not see the need to cook for himself. His ECG reveals a nonspecific T wave abnormality and one troponin that was checked was normal. He previously had documented normal left ventricle systolic function and indeterminate diastolic function in 01/2013. His sodium is up to 126 after an additional dose of IV Lasix 80 mg. His BP is well controlled. I feel he can be resumed on his oral Lasix regimen of 80 mg daily. If he develops increased swelling or shortness of breath, he can be transitioned to 80 mg q am and 40 mg q pm. 2. 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 He remains anticoagulated on warfarin.  3. HTN: controlled on current therapy.  4. Hyperlipidemia: lipids on 01/28/13 were within normal limits. Continue current dose of Lipitor. LDL 74 and HDL 64.  5. Atrial fibrillation: currently in normal sinus rhythm.  6. NSVT/SVT: stable. If this recurs, would check magnesium levels given his h/o hypomagnesemia. He is on magnesium supplementation. 7. 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 Heart failure may be contributing to this as well. It has improved with furosemide administration, and is up to 126. 8. 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 He may benefit from consultation with [facility].   Dispo: should be able to be discharged within next 24 hours. I have made arrangements for an office f/u visit with me.    Prentice Docker, M.D., F.A.C.C.

## 2013-05-31 NOTE — Progress Notes (Signed)
Patient evaluated for community based chronic disease management services with [facility] Care Management Program as a benefit of patient's [employer]. Spoke with patient via phone to explain [facility] Care Management services.  Patient will receive a post discharge transition of care call and will be evaluated for monthly home visits for assessments and CHF disease process education.  Verbal consent only obtained by phone.  Made Inpatient Case Manager Arlyss Queen RN aware that [facility] Care Management following. Of note, [facility] Care Management services does not replace or interfere with any services that are arranged by inpatient case management or social work.  For additional questions or referrals please contact Anibal Henderson BSN RN [facility] Liaison at [phone].

## 2013-05-31 NOTE — Progress Notes (Signed)
ANTICOAGULATION CONSULT NOTE  Pharmacy Consult for Coumadin  Indication: Mechanical AVR/MVR, Afib  No Known Allergies  Patient Measurements: Height: 5\' 6"  (167.6 cm) Weight: 156 lb 15.5 oz (71.2 kg) IBW/kg (Calculated) : 63.8  Vital Signs: Temp: 97.8 F (36.6 C) (11/26 0400) Temp src: Oral (11/26 0400) BP: 140/61 mmHg (11/26 1015) Pulse Rate: 61 (11/26 1015)  Labs:  Recent Labs  05/29/13 0826 05/29/13 0845 05/30/13 0522 05/30/13 2100 05/31/13 0559  HGB 9.7*  --  9.2*  --   --   HCT 27.6*  --  26.2*  --   --   PLT 319  --  296  --   --   LABPROT  --  41.9* 36.9*  --  27.2*  INR  --  4.63* 3.92*  --  2.63*  CREATININE 1.22  --  1.26 2.18* 1.61*  TROPONINI <0.30  --   --   --   --    Estimated Creatinine Clearance: 40.2 ml/min (by C-G formula based on Cr of 1.61).  Medical History: Past Medical History  Diagnosis Date  . Hypertension   . CAD (coronary artery disease) 2003    a. CABG/MVR/AVR-2003 (#25 St. Jude/#21 St. Jude); anticoagulation; b. negative stress nuclear-2005;  c. 01/2013 NSTEMI/Cath/PCI: LM nl, LAD 50p, 40-15m, D1 50ost, LCX 72m, RCA 50/87m (2.5x20 Promus DES), 50d, VG->Diag 100, VG->PDA 100, VG->OM 100, LIMA->LAD ok.  . Epistaxis     requiring cautery & aterial ligation-09/2009  . GERD (gastroesophageal reflux disease)   . Hyperlipemia   . Abnormal LFTs     possible cirrhosis  . Chronic anticoagulation   . Valvular heart disease     a. 2003: MVR/AVR-2003 (#25 St. Jude/#21 St. Jude);  b. 01/2013 Echo: EF 55%, Mech AVR mean grad 12, Mech MVR mean grad 6.  . Tobacco abuse     45 pack years  . Fasting hyperglycemia   . Nephrolithiasis   . Diverticulosis   . Hyponatremia   . Chronic diastolic CHF (congestive heart failure)   . Chronic renal disease   . Hemolytic anemia     history of  . ETOH abuse   . A-fib 01/16/2013    a. on coumadin  . COPD (chronic obstructive pulmonary disease)     CONTROLED   Medications:  Prescriptions prior to  admission  Medication Sig Dispense Refill  . acetaminophen (TYLENOL) 325 MG tablet Take 2 tablets (650 mg total) by mouth every 6 (six) hours as needed. For pain  30 tablet  0  . amLODipine (NORVASC) 10 MG tablet Take 1 tablet (10 mg total) by mouth daily.      Marland Kitchen atorvastatin (LIPITOR) 40 MG tablet Take 40 mg by mouth daily.      . clopidogrel (PLAVIX) 75 MG tablet Take 1 tablet (75 mg total) by mouth daily with breakfast.  30 tablet  4  . ferrous sulfate 325 (65 FE) MG EC tablet Take 325 mg by mouth daily with breakfast.      . folic acid (FOLVITE) 1 MG tablet Take 1 tablet (1 mg total) by mouth daily.  30 tablet  4  . furosemide (LASIX) 40 MG tablet Take 2 tablets (80 mg total) by mouth daily.  60 tablet  6  . HYDROcodone-acetaminophen (LORTAB) 10-500 MG per tablet Take 1 tablet by mouth 4 times daily as needed for pain.       Marland Kitchen levalbuterol (XOPENEX) 0.63 MG/3ML nebulizer solution Take 3 mLs (0.63 mg total) by nebulization every 6 (  six) hours as needed for wheezing or shortness of breath.  3 mL  4  . lisinopril (PRINIVIL,ZESTRIL) 20 MG tablet Take 1 tablet (20 mg total) by mouth daily.  30 tablet  6  . magnesium oxide (MAG-OX) 400 (241.3 MG) MG tablet Take 1 tablet (400 mg total) by mouth daily.  30 tablet  6  . metoprolol (LOPRESSOR) 100 MG tablet Take 1 tablet (100 mg total) by mouth 2 (two) times daily.  60 tablet  6  . Multiple Vitamin (MULTIVITAMIN WITH MINERALS) TABS Take 1 tablet by mouth daily.      . nitroGLYCERIN (NITROSTAT) 0.4 MG SL tablet Place 1 tablet (0.4 mg total) under the tongue every 5 (five) minutes x 3 doses as needed for chest pain.  25 tablet  3  . pantoprazole (PROTONIX) 40 MG tablet Take 1 tablet (40 mg total) by mouth daily.  30 tablet  6  . thiamine 100 MG tablet Take 1 tablet (100 mg total) by mouth daily.  30 tablet  4  . warfarin (COUMADIN) 5 MG tablet Take 5-7.5 mg by mouth See admin instructions. Take 1.5 tab (7.5 mg) monday Wednesday Friday, and 5 mg on Tuesday,  Thursday, Saturday Sunday       Assessment: 67yo male on chronic Coumadin PTA for mechanical AVR/MVR, afib.  Home dose is listed above.  INR was supratherapeutic on admission but has now trended down to goal range after holding Coumadin (no coumadin since admission).  No bleeding noted.   Goal of Therapy:  INR 2.5-3.5 (per chart records)   Plan:  Coumadin 7.5mg  today x 1 INR daily  Margo Aye, Taylor Levick A 05/31/2013,10:22 AM

## 2013-05-31 NOTE — Progress Notes (Signed)
Subjective: He says he feels well and has no new complaints. His sodium levels up to 126. He denies shortness of breath.  Objective: Vital signs in last 24 hours: Temp:  [97.7 F (36.5 C)-98.5 F (36.9 C)] 97.8 F (36.6 C) (11/26 0400) Pulse Rate:  [25-114] 114 (11/26 0600) Resp:  [10-22] 10 (11/26 0400) BP: (76-159)/(37-92) 137/43 mmHg (11/26 0600) SpO2:  [80 %-100 %] 100 % (11/26 0600) Weight:  [71.2 kg (156 lb 15.5 oz)] 71.2 kg (156 lb 15.5 oz) (11/26 0500) Weight change: -0.2 kg (-7.1 oz) Last BM Date: 05/28/13  Intake/Output from previous day: 11/25 0701 - 11/26 0700 In: 2643 [P.O.:2640; I.V.:3] Out: 1150 [Urine:1150]  PHYSICAL EXAM General appearance: alert, cooperative and no distress Resp: His chest is much clearer now Cardio: He has prosthetic heart valve sounds GI: soft, non-tender; bowel sounds normal; no masses,  no organomegaly Extremities: Trace edema  Lab Results:    Basic Metabolic Panel:  Recent Labs  52/84/13 2100 05/31/13 0559  NA 126* 126*  K 5.9* 5.1  CL 91* 90*  CO2 27 28  GLUCOSE 97 100*  BUN 46* 44*  CREATININE 2.18* 1.61*  CALCIUM 8.9 9.2   Liver Function Tests:  Recent Labs  05/29/13 0826  AST 23  ALT 14  ALKPHOS 137*  BILITOT 0.8  PROT 7.6  ALBUMIN 3.8   No results found for this basename: LIPASE, AMYLASE,  in the last 72 hours No results found for this basename: AMMONIA,  in the last 72 hours CBC:  Recent Labs  05/29/13 0826 05/30/13 0522  WBC 7.3 5.9  NEUTROABS 6.1  --   HGB 9.7* 9.2*  HCT 27.6* 26.2*  MCV 92.9 93.6  PLT 319 296   Cardiac Enzymes:  Recent Labs  05/29/13 0826  TROPONINI <0.30   BNP:  Recent Labs  05/29/13 0826  PROBNP 6106.0*   D-Dimer: No results found for this basename: DDIMER,  in the last 72 hours CBG: No results found for this basename: GLUCAP,  in the last 72 hours Hemoglobin A1C: No results found for this basename: HGBA1C,  in the last 72 hours Fasting Lipid Panel: No  results found for this basename: CHOL, HDL, LDLCALC, TRIG, CHOLHDL, LDLDIRECT,  in the last 72 hours Thyroid Function Tests: No results found for this basename: TSH, T4TOTAL, FREET4, T3FREE, THYROIDAB,  in the last 72 hours Anemia Panel: No results found for this basename: VITAMINB12, FOLATE, FERRITIN, TIBC, IRON, RETICCTPCT,  in the last 72 hours Coagulation:  Recent Labs  05/30/13 0522 05/31/13 0559  LABPROT 36.9* 27.2*  INR 3.92* 2.63*   Urine Drug Screen: Drugs of Abuse  No results found for this basename: labopia, cocainscrnur, labbenz, amphetmu, thcu, labbarb    Alcohol Level: No results found for this basename: ETH,  in the last 72 hours Urinalysis: No results found for this basename: COLORURINE, APPERANCEUR, LABSPEC, PHURINE, GLUCOSEU, HGBUR, BILIRUBINUR, KETONESUR, PROTEINUR, UROBILINOGEN, NITRITE, LEUKOCYTESUR,  in the last 72 hours Misc. Labs:  ABGS No results found for this basename: PHART, PCO2, PO2ART, TCO2, HCO3,  in the last 72 hours CULTURES Recent Results (from the past 240 hour(s))  MRSA PCR SCREENING     Status: None   Collection Time    05/29/13  3:47 PM      Result Value Range Status   MRSA by PCR NEGATIVE  NEGATIVE Final   Comment:            The GeneXpert MRSA Assay (FDA  approved for NASAL specimens     only), is one component of a     comprehensive MRSA colonization     surveillance program. It is not     intended to diagnose MRSA     infection nor to guide or     monitor treatment for     MRSA infections.   Studies/Results: Dg Chest Portable 1 View  05/29/2013   CLINICAL DATA:  Shortness of breath  EXAM: PORTABLE CHEST - 1 VIEW  COMPARISON:  01/29/2013  FINDINGS: Heart is enlarged. Prior coronary bypass changes and prosthetic valve replacement noted. New diffuse bilateral mixed interstitial and airspace opacities, similar to several prior exams compatible with CHF. No large effusion or pneumothorax. Trachea midline. Atherosclerosis of the  aorta evident.  IMPRESSION: Diffuse mixed interstitial and airspace process compatible with recurrent CHF   Electronically Signed   By: Ruel Favors M.D.   On: 05/29/2013 08:33    Medications:  Prior to Admission:  Prescriptions prior to admission  Medication Sig Dispense Refill  . acetaminophen (TYLENOL) 325 MG tablet Take 2 tablets (650 mg total) by mouth every 6 (six) hours as needed. For pain  30 tablet  0  . amLODipine (NORVASC) 10 MG tablet Take 1 tablet (10 mg total) by mouth daily.      Marland Kitchen atorvastatin (LIPITOR) 40 MG tablet Take 40 mg by mouth daily.      . clopidogrel (PLAVIX) 75 MG tablet Take 1 tablet (75 mg total) by mouth daily with breakfast.  30 tablet  4  . ferrous sulfate 325 (65 FE) MG EC tablet Take 325 mg by mouth daily with breakfast.      . folic acid (FOLVITE) 1 MG tablet Take 1 tablet (1 mg total) by mouth daily.  30 tablet  4  . furosemide (LASIX) 40 MG tablet Take 2 tablets (80 mg total) by mouth daily.  60 tablet  6  . HYDROcodone-acetaminophen (LORTAB) 10-500 MG per tablet Take 1 tablet by mouth 4 times daily as needed for pain.       Marland Kitchen levalbuterol (XOPENEX) 0.63 MG/3ML nebulizer solution Take 3 mLs (0.63 mg total) by nebulization every 6 (six) hours as needed for wheezing or shortness of breath.  3 mL  4  . lisinopril (PRINIVIL,ZESTRIL) 20 MG tablet Take 1 tablet (20 mg total) by mouth daily.  30 tablet  6  . magnesium oxide (MAG-OX) 400 (241.3 MG) MG tablet Take 1 tablet (400 mg total) by mouth daily.  30 tablet  6  . metoprolol (LOPRESSOR) 100 MG tablet Take 1 tablet (100 mg total) by mouth 2 (two) times daily.  60 tablet  6  . Multiple Vitamin (MULTIVITAMIN WITH MINERALS) TABS Take 1 tablet by mouth daily.      . nitroGLYCERIN (NITROSTAT) 0.4 MG SL tablet Place 1 tablet (0.4 mg total) under the tongue every 5 (five) minutes x 3 doses as needed for chest pain.  25 tablet  3  . pantoprazole (PROTONIX) 40 MG tablet Take 1 tablet (40 mg total) by mouth daily.  30  tablet  6  . thiamine 100 MG tablet Take 1 tablet (100 mg total) by mouth daily.  30 tablet  4  . warfarin (COUMADIN) 5 MG tablet Take 5-7.5 mg by mouth See admin instructions. Take 1.5 tab (7.5 mg) monday Wednesday Friday, and 5 mg on Tuesday, Thursday, Saturday Sunday       Scheduled: . amLODipine  10 mg Oral Daily  . atorvastatin  40 mg Oral Daily  . clopidogrel  75 mg Oral Q breakfast  . ferrous sulfate  325 mg Oral Q breakfast  . folic acid  1 mg Oral Daily  . lisinopril  20 mg Oral Daily  . magnesium oxide  400 mg Oral Daily  . metoprolol  100 mg Oral BID  . multivitamin with minerals  1 tablet Oral Daily  . pantoprazole  40 mg Oral Daily  . sodium chloride  3 mL Intravenous Q12H  . sodium chloride  3 mL Intravenous Q12H  . thiamine  100 mg Oral Daily  . Warfarin - Pharmacist Dosing Inpatient   Does not apply Q24H   Continuous:  ZOX:WRUEAV chloride, acetaminophen, acetaminophen, HYDROcodone-acetaminophen, levalbuterol, morphine injection, nitroGLYCERIN, ondansetron (ZOFRAN) IV, ondansetron, sodium chloride, traZODone  Assesment: He was admitted with acute on chronic congestive heart failure. He has known severe coronary artery occlusive disease and has had bypass grafting. He has had mechanical aortic and mitral valve replacements atrial fibrillation and COPD. He has problems with alcohol and tobacco abuse. His sodium is still low and I suspect there is some element of SIADH plus his dietary indiscretions plus possibly from COPD. Active Problems:   * No active hospital problems. *    Plan: I think he can safely move out of the ICU and he may be able to go home tomorrow. I would like for him to have dietary consult and I agree if he can be followed by [facility] services that may help him significantly    LOS: 2 days   Jerzie Bieri L 05/31/2013, 7:44 AM

## 2013-05-31 NOTE — Progress Notes (Signed)
Report called to [employer].  Patient is ready for transfer to room 336.  No acute distress noted.  Out via wheelchair to new room.

## 2013-06-01 LAB — BASIC METABOLIC PANEL
CO2: 29 mEq/L (ref 19–32)
Calcium: 9.3 mg/dL (ref 8.4–10.5)
Chloride: 91 mEq/L — ABNORMAL LOW (ref 96–112)
Creatinine, Ser: 1.53 mg/dL — ABNORMAL HIGH (ref 0.50–1.35)
GFR calc non Af Amer: 45 mL/min — ABNORMAL LOW (ref >90)
Glucose, Bld: 93 mg/dL (ref 70–99)

## 2013-06-01 LAB — PROTIME-INR: INR: 1.98 — ABNORMAL HIGH (ref 0.00–1.49)

## 2013-06-01 LAB — POTASSIUM: Potassium: 5.2 mEq/L — ABNORMAL HIGH (ref 3.5–5.1)

## 2013-06-01 MED ORDER — FUROSEMIDE 40 MG PO TABS: 80.0000 mg | ORAL_TABLET | Freq: Two times a day (BID) | ORAL | Status: DC

## 2013-06-01 MED ORDER — SODIUM POLYSTYRENE SULFONATE 15 GM/60ML PO SUSP
30.0000 g | Freq: Once | ORAL | Status: AC
  Administered 2013-06-01: 30 g via ORAL
  Filled 2013-06-01: qty 120

## 2013-06-01 MED ORDER — WARFARIN SODIUM 7.5 MG PO TABS
7.5000 mg | ORAL_TABLET | Freq: Once | ORAL | Status: AC
  Administered 2013-06-01: 7.5 mg via ORAL
  Filled 2013-06-01: qty 1

## 2013-06-01 NOTE — Progress Notes (Signed)
O2 on room air, at rest-91%. When ambulating on room air, O2 sat dropped to 86%, while ambulating on 2L of O2, sats came up to 95%.

## 2013-06-01 NOTE — Progress Notes (Signed)
NAMETORIAN, QUINTERO NO.:  [id number]  MEDICAL RECORD NO.:  [id number]  LOCATION:  A336                          FACILITY:  [facility]  PHYSICIAN:  Lyanne Kates L. Juanetta [name], M.D.DATE OF BIRTH:  Apr 29, 1946  DATE OF PROCEDURE: DATE OF DISCHARGE:  06/01/2013                                PROGRESS NOTE   [name] says he feels better and wants to go home.  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 He has no other new complaints.  He has no chest pain.  He is not short of breath.  PHYSICAL EXAMINATION:  VITAL SIGNS:  Temperature is 98.2, pulse 49, respirations 20, blood pressure 133/41 O2 sats 96%. CHEST:  Much clearer. HEART:  Irregular.  He has prosthetic heart valve sounds. ABDOMEN:  Soft.  His potassium is 5.5, repeat 5.2 and he received Kayexalate.  His sodium is up to 127.  ASSESSMENT:  He has hyperkalemia and he is not on potassium replacement. He is going to receive Kayexalate.  He may still be able to go home, depending on what his potassium does not, but will need close follow up. I had anticipated sending him home with home health, but I do not think he is homebound.  He needs better diet compliance and he says he will try.  As mentioned, he maybe able to go home.     [name], M.D.     ELH/MEDQ  D:  06/01/2013  T:  06/01/2013  Job:  [id number]

## 2013-06-01 NOTE — Progress Notes (Signed)
Pt verbalizes understanding of d/c instructions, medications and follow up information. CHF is not a new dx, but HF information was reviewed w/pt.  IV d/c without complications. Prescription for lasix was called into pts pharmacy. Pt d/c on portable O2 tank, 2L. Pt has no questions at this time. Pt d/c via wheelchair accompanied by staff member. Pts brother will be driving him home. Sheryn Bison, RN

## 2013-06-01 NOTE — Progress Notes (Signed)
ANTICOAGULATION CONSULT NOTE  Pharmacy Consult for Coumadin  Indication: Mechanical AVR/MVR, Afib  No Known Allergies  Patient Measurements: Height: 5\' 6"  (167.6 cm) Weight: 160 lb 8 oz (72.802 kg) IBW/kg (Calculated) : 63.8  Vital Signs: Temp: 97.8 F (36.6 C) (11/27 0622) Temp src: Oral (11/27 0622) BP: 137/60 mmHg (11/27 0622) Pulse Rate: 73 (11/27 0622)  Labs:  Recent Labs  05/30/13 0522 05/30/13 2100 05/31/13 0559 06/01/13 0613  HGB 9.2*  --   --   --   HCT 26.2*  --   --   --   PLT 296  --   --   --   LABPROT 36.9*  --  27.2* 21.9*  INR 3.92*  --  2.63* 1.98*  CREATININE 1.26 2.18* 1.61* 1.53*   Estimated Creatinine Clearance: 42.3 ml/min (by C-G formula based on Cr of 1.53).  Medical History: Past Medical History  Diagnosis Date  . Hypertension   . CAD (coronary artery disease) 2003    a. CABG/MVR/AVR-2003 (#25 St. Jude/#21 St. Jude); anticoagulation; b. negative stress nuclear-2005;  c. 01/2013 NSTEMI/Cath/PCI: LM nl, LAD 50p, 40-55m, D1 50ost, LCX 23m, RCA 50/32m (2.5x20 Promus DES), 50d, VG->Diag 100, VG->PDA 100, VG->OM 100, LIMA->LAD ok.  . Epistaxis     requiring cautery & aterial ligation-09/2009  . GERD (gastroesophageal reflux disease)   . Hyperlipemia   . Abnormal LFTs     possible cirrhosis  . Chronic anticoagulation   . Valvular heart disease     a. 2003: MVR/AVR-2003 (#25 St. Jude/#21 St. Jude);  b. 01/2013 Echo: EF 55%, Mech AVR mean grad 12, Mech MVR mean grad 6.  . Tobacco abuse     45 pack years  . Fasting hyperglycemia   . Nephrolithiasis   . Diverticulosis   . Hyponatremia   . Chronic diastolic CHF (congestive heart failure)   . Chronic renal disease   . Hemolytic anemia     history of  . ETOH abuse   . A-fib 01/16/2013    a. on coumadin  . COPD (chronic obstructive pulmonary disease)     CONTROLED   Medications:  Prescriptions prior to admission  Medication Sig Dispense Refill  . acetaminophen (TYLENOL) 325 MG tablet Take 2  tablets (650 mg total) by mouth every 6 (six) hours as needed. For pain  30 tablet  0  . amLODipine (NORVASC) 10 MG tablet Take 1 tablet (10 mg total) by mouth daily.      Marland Kitchen atorvastatin (LIPITOR) 40 MG tablet Take 40 mg by mouth daily.      . clopidogrel (PLAVIX) 75 MG tablet Take 1 tablet (75 mg total) by mouth daily with breakfast.  30 tablet  4  . ferrous sulfate 325 (65 FE) MG EC tablet Take 325 mg by mouth daily with breakfast.      . folic acid (FOLVITE) 1 MG tablet Take 1 tablet (1 mg total) by mouth daily.  30 tablet  4  . furosemide (LASIX) 40 MG tablet Take 2 tablets (80 mg total) by mouth daily.  60 tablet  6  . HYDROcodone-acetaminophen (LORTAB) 10-500 MG per tablet Take 1 tablet by mouth 4 times daily as needed for pain.       Marland Kitchen levalbuterol (XOPENEX) 0.63 MG/3ML nebulizer solution Take 3 mLs (0.63 mg total) by nebulization every 6 (six) hours as needed for wheezing or shortness of breath.  3 mL  4  . lisinopril (PRINIVIL,ZESTRIL) 20 MG tablet Take 1 tablet (20 mg total) by  mouth daily.  30 tablet  6  . magnesium oxide (MAG-OX) 400 (241.3 MG) MG tablet Take 1 tablet (400 mg total) by mouth daily.  30 tablet  6  . metoprolol (LOPRESSOR) 100 MG tablet Take 1 tablet (100 mg total) by mouth 2 (two) times daily.  60 tablet  6  . Multiple Vitamin (MULTIVITAMIN WITH MINERALS) TABS Take 1 tablet by mouth daily.      . nitroGLYCERIN (NITROSTAT) 0.4 MG SL tablet Place 1 tablet (0.4 mg total) under the tongue every 5 (five) minutes x 3 doses as needed for chest pain.  25 tablet  3  . pantoprazole (PROTONIX) 40 MG tablet Take 1 tablet (40 mg total) by mouth daily.  30 tablet  6  . thiamine 100 MG tablet Take 1 tablet (100 mg total) by mouth daily.  30 tablet  4  . warfarin (COUMADIN) 5 MG tablet Take 5-7.5 mg by mouth See admin instructions. Take 1.5 tab (7.5 mg) monday Wednesday Friday, and 5 mg on Tuesday, Thursday, Saturday Sunday       Assessment: 67yo male on chronic Coumadin PTA for  mechanical AVR/MVR, afib.  Home dose is listed above.  INR was supratherapeutic on admission, however per outpatient records he has been therapeutic on this regimen for several months.  INR has now dropped below goal range after holding Coumadin.  No bleeding noted.   Goal of Therapy:  INR 2.5-3.5 (per chart records)   Plan:  Coumadin 7.5mg  today x 1 INR daily Anticipate discharge back home on previous home regimen  Elson Clan 06/01/2013,9:33 AM

## 2013-06-08 NOTE — Discharge Planning (Signed)
Physician Discharge Summary  Patient ID: [name] MRN: [id number] DOB/AGE: 08/11/1945 67 y.o. Primary Care Physician:Craigory Toste L, MD Admit date: 05/29/2013 Discharge date: 06/08/2013    Discharge Diagnoses:   Active Problems:   HYPONATREMIA, CHRONIC   CHRONIC OBSTRUCTIVE PULMONARY DISEASE   MITRAL VALVE REPLACEMENT, HX OF   AORTIC VALVE REPLACEMENT, HX OF   Chronic anticoagulation   Arteriosclerotic cardiovascular disease (ASCVD)   Hypertension   GERD (gastroesophageal reflux disease)   Acute respiratory failure with hypoxia   Acute on chronic diastolic CHF (congestive heart failure), NYHA class 4     Medication List    STOP taking these medications       lisinopril 20 MG tablet  Commonly known as:  PRINIVIL,ZESTRIL      TAKE these medications       acetaminophen 325 MG tablet  Commonly known as:  TYLENOL  Take 2 tablets (650 mg total) by mouth every 6 (six) hours as needed. For pain     amLODipine 10 MG tablet  Commonly known as:  NORVASC  Take 1 tablet (10 mg total) by mouth daily.     atorvastatin 40 MG tablet  Commonly known as:  LIPITOR  Take 40 mg by mouth daily.     clopidogrel 75 MG tablet  Commonly known as:  PLAVIX  Take 1 tablet (75 mg total) by mouth daily with breakfast.     ferrous sulfate 325 (65 FE) MG EC tablet  Take 325 mg by mouth daily with breakfast.     folic acid 1 MG tablet  Commonly known as:  FOLVITE  Take 1 tablet (1 mg total) by mouth daily.     furosemide 40 MG tablet  Commonly known as:  LASIX  Take 2 tablets (80 mg total) by mouth 2 (two) times daily.     HYDROcodone-acetaminophen 10-500 MG per tablet  Commonly known as:  LORTAB  Take 1 tablet by mouth 4 times daily as needed for pain.     levalbuterol 0.63 MG/3ML nebulizer solution  Commonly known as:  XOPENEX  Take 3 mLs (0.63 mg total) by nebulization every 6 (six) hours as needed for wheezing or shortness of breath.     magnesium oxide 400 (241.3 MG)  MG tablet  Commonly known as:  MAG-OX  Take 1 tablet (400 mg total) by mouth daily.     metoprolol 100 MG tablet  Commonly known as:  LOPRESSOR  Take 1 tablet (100 mg total) by mouth 2 (two) times daily.     multivitamin with minerals Tabs tablet  Take 1 tablet by mouth daily.     nitroGLYCERIN 0.4 MG SL tablet  Commonly known as:  NITROSTAT  Place 1 tablet (0.4 mg total) under the tongue every 5 (five) minutes x 3 doses as needed for chest pain.     pantoprazole 40 MG tablet  Commonly known as:  PROTONIX  Take 1 tablet (40 mg total) by mouth daily.     thiamine 100 MG tablet  Take 1 tablet (100 mg total) by mouth daily.     warfarin 5 MG tablet  Commonly known as:  COUMADIN  Take 5-7.5 mg by mouth See admin instructions. Take 1.5 tab (7.5 mg) monday Wednesday Friday, and 5 mg on Tuesday, Thursday, Saturday Sunday        Discharged Condition: Improved    Consults: Cardiology  Significant Diagnostic Studies: Dg Chest Portable 1 View  05/29/2013   CLINICAL DATA:  Shortness of breath  EXAM: PORTABLE CHEST - 1 VIEW  COMPARISON:  01/29/2013  FINDINGS: Heart is enlarged. Prior coronary bypass changes and prosthetic valve replacement noted. New diffuse bilateral mixed interstitial and airspace opacities, similar to several prior exams compatible with CHF. No large effusion or pneumothorax. Trachea midline. Atherosclerosis of the aorta evident.  IMPRESSION: Diffuse mixed interstitial and airspace process compatible with recurrent CHF   Electronically Signed   By: Ruel Favors M.D.   On: 05/29/2013 08:33    Lab Results: Basic Metabolic Panel: No results found for this basename: NA, K, CL, CO2, GLUCOSE, BUN, CREATININE, CALCIUM, MG, PHOS,  in the last 72 hours Liver Function Tests: No results found for this basename: AST, ALT, ALKPHOS, BILITOT, PROT, ALBUMIN,  in the last 72 hours   CBC: No results found for this basename: WBC, NEUTROABS, HGB, HCT, MCV, PLT,  in the last 72  hours  Recent Results (from the past 240 hour(s))  MRSA PCR SCREENING     Status: None   Collection Time    05/29/13  3:47 PM      Result Value Range Status   MRSA by PCR NEGATIVE  NEGATIVE Final   Comment:            The GeneXpert MRSA Assay (FDA     approved for NASAL specimens     only), is one component of a     comprehensive MRSA colonization     surveillance program. It is not     intended to diagnose MRSA     infection nor to guide or     monitor treatment for     MRSA infections.     Hospital Course: He was admitted with CHF and hyponatremia. He has chronic hyponatremia. He has not been following his diet appropriately at home. He came to the emergency department where he was found to be in CHF and his sodium level was quite low. He was admitted to step down started on Lasix and improved. He was switched to oral meds. He tolerated this well his weight came down and his sodium came back up. He had hyperkalemia which was treated with Kayexalate and improved. By the time of discharge she was back at baseline. He has chronic atrial fibrillation and has heart valve replacements. His chest was clear. He had no edema. His sodium level had come up to 129. His potassium is less than 5.  Discharge Exam: Blood pressure 133/41, pulse 49, temperature 98.2 F (36.8 C), temperature source Oral, resp. rate 20, height 5\' 6"  (1.676 m), weight 72.802 kg (160 lb 8 oz), SpO2 95.00%. He has prosthetic heart valve sounds. He is in atrial fibrillation. 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 He will have repeat basic metabolic profile  The day after discharge.      Discharge Orders   Future Appointments Provider Department Dept Phone   07/03/2013 2:30 PM Jodelle Gross, NP [facility] [phone]   Future Orders Complete By Expires   Diet - low sodium heart healthy  As directed    Increase activity slowly   As directed       Follow-up Information   Follow up with [facility] On 07/03/2013. (at 2:30 pm)    Contact information:   [address] [place] [address]       Signed: Tommy Rainwater [phone]  06/08/2013, 7:50 AM

## 2013-06-09 NOTE — Discharge Summary (Signed)
Discharge Diagnoses:  Active Problems:  HYPONATREMIA, CHRONIC  CHRONIC OBSTRUCTIVE PULMONARY DISEASE  MITRAL VALVE REPLACEMENT, HX OF  AORTIC VALVE REPLACEMENT, HX OF  Chronic anticoagulation  Arteriosclerotic cardiovascular disease (ASCVD)  Hypertension  GERD (gastroesophageal reflux disease)  Acute respiratory failure with hypoxia  Acute on chronic diastolic CHF (congestive heart failure), NYHA class 4     Medication List     STOP taking these medications       lisinopril 20 MG tablet    Commonly known as: PRINIVIL,ZESTRIL     TAKE these medications       acetaminophen 325 MG tablet    Commonly known as: TYLENOL    Take 2 tablets (650 mg total) by mouth every 6 (six) hours as needed. For pain    amLODipine 10 MG tablet    Commonly known as: NORVASC    Take 1 tablet (10 mg total) by mouth daily.    atorvastatin 40 MG tablet    Commonly known as: LIPITOR    Take 40 mg by mouth daily.    clopidogrel 75 MG tablet    Commonly known as: PLAVIX    Take 1 tablet (75 mg total) by mouth daily with breakfast.    ferrous sulfate 325 (65 FE) MG EC tablet    Take 325 mg by mouth daily with breakfast.    folic acid 1 MG tablet    Commonly known as: FOLVITE    Take 1 tablet (1 mg total) by mouth daily.    furosemide 40 MG tablet    Commonly known as: LASIX    Take 2 tablets (80 mg total) by mouth 2 (two) times daily.    HYDROcodone-acetaminophen 10-500 MG per tablet    Commonly known as: LORTAB    Take 1 tablet by mouth 4 times daily as needed for pain.    levalbuterol 0.63 MG/3ML nebulizer solution    Commonly known as: XOPENEX    Take 3 mLs (0.63 mg total) by nebulization every 6 (six) hours as needed for wheezing or shortness of breath.    magnesium oxide 400 (241.3 MG) MG tablet    Commonly known as: MAG-OX    Take 1 tablet (400 mg total) by mouth daily.    metoprolol 100 MG tablet    Commonly known as: LOPRESSOR    Take 1 tablet (100 mg total) by mouth 2 (two) times  daily.    multivitamin with minerals Tabs tablet    Take 1 tablet by mouth daily.    nitroGLYCERIN 0.4 MG SL tablet    Commonly known as: NITROSTAT    Place 1 tablet (0.4 mg total) under the tongue every 5 (five) minutes x 3 doses as needed for chest pain.    pantoprazole 40 MG tablet    Commonly known as: PROTONIX    Take 1 tablet (40 mg total) by mouth daily.    thiamine 100 MG tablet    Take 1 tablet (100 mg total) by mouth daily.    warfarin 5 MG tablet    Commonly known as: COUMADIN    Take 5-7.5 mg by mouth See admin instructions. Take 1.5 tab (7.5 mg) monday Wednesday Friday, and 5 mg on Tuesday, Thursday, Saturday Sunday      Discharged Condition: Improved  Consults: Cardiology  Significant Diagnostic Studies:  Dg Chest Portable 1 View  05/29/2013 CLINICAL DATA: Shortness of breath EXAM: PORTABLE CHEST - 1 VIEW COMPARISON: 01/29/2013 FINDINGS: Heart is enlarged. Prior coronary bypass changes  and prosthetic valve replacement noted. New diffuse bilateral mixed interstitial and airspace opacities, similar to several prior exams compatible with CHF. No large effusion or pneumothorax. Trachea midline. Atherosclerosis of the aorta evident. IMPRESSION: Diffuse mixed interstitial and airspace process compatible with recurrent CHF Electronically Signed By: Ruel Favors M.D. On: 05/29/2013 08:33  Lab Results:  Basic Metabolic Panel:  No results found for this basename: NA, K, CL, CO2, GLUCOSE, BUN, CREATININE, CALCIUM, MG, PHOS, in the last 72 hours  Liver Function Tests:  No results found for this basename: AST, ALT, ALKPHOS, BILITOT, PROT, ALBUMIN, in the last 72 hours  CBC:  No results found for this basename: WBC, NEUTROABS, HGB, HCT, MCV, PLT, in the last 72 hours  Recent Results (from the past 240 hour(s))   MRSA PCR SCREENING Status: None    Collection Time    05/29/13 3:47 PM   Result  Value  Range  Status    MRSA by PCR  NEGATIVE  NEGATIVE  Final    Comment:      The  GeneXpert MRSA Assay (FDA     approved for NASAL specimens     only), is one component of a     comprehensive MRSA colonization     surveillance program. It is not     intended to diagnose MRSA     infection nor to guide or     monitor treatment for     MRSA infections.    Hospital Course: He was admitted with CHF and hyponatremia. He has chronic hyponatremia. He has not been following his diet appropriately at home. He came to the emergency department where he was found to be in CHF and his sodium level was quite low. He was admitted to step down started on Lasix and improved. He was switched to oral meds. He tolerated this well his weight came down and his sodium came back up. He had hyperkalemia which was treated with Kayexalate and improved. By the time of discharge she was back at baseline. He has chronic atrial fibrillation and has heart valve replacements. His chest was clear. He had no edema. His sodium level had come up to 129. His potassium is less than 5.  Discharge Exam:  Blood pressure 133/41, pulse 49, temperature 98.2 F (36.8 C), temperature source Oral, resp. rate 20, height 5\' 6"  (1.676 m), weight 72.802 kg (160 lb 8 oz), SpO2 95.00%.  He has prosthetic heart valve sounds. He is in atrial fibrillation. 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 He will have repeat basic metabolic profile The day after discharge.      Discharge Orders    Future Appointments  Provider  Department  Dept Phone    07/03/2013 2:30 PM  Jodelle Gross, NP  [facility]  [phone]    Future Orders  Complete By  Expires    Diet - low sodium heart healthy  As directed     Increase activity slowly  As directed       Follow-up Information    Follow up with [facility] On 07/03/2013. (at 2:30 pm)    Contact information:    [address]  [place] [address]      Signed:   Tommy Rainwater [phone]  06/08/2013, 7:50 AM

## 2013-06-14 ENCOUNTER — Encounter (HOSPITAL_COMMUNITY): Payer: Self-pay | Admitting: Pharmacy Technician

## 2013-06-16 ENCOUNTER — Other Ambulatory Visit: Payer: Self-pay | Admitting: Cardiology

## 2013-06-24 ENCOUNTER — Other Ambulatory Visit: Payer: Self-pay | Admitting: Cardiology

## 2013-06-27 ENCOUNTER — Ambulatory Visit (HOSPITAL_COMMUNITY)
Admission: RE | Admit: 2013-06-27 | Discharge: 2013-06-27 | Disposition: A | Discharge status: Home / Self Care | Payer: Medicare Other | Source: Ambulatory Visit | Attending: Ophthalmology | Admitting: Ophthalmology

## 2013-06-27 ENCOUNTER — Encounter (HOSPITAL_COMMUNITY)
Admission: RE | Disposition: A | Discharge status: Home / Self Care | Payer: Self-pay | Source: Ambulatory Visit | Attending: Ophthalmology

## 2013-06-27 ENCOUNTER — Encounter (HOSPITAL_COMMUNITY): Payer: Self-pay | Admitting: *Deleted

## 2013-06-27 DIAGNOSIS — H26499 Other secondary cataract, unspecified eye: Secondary | ICD-10-CM | POA: Insufficient documentation

## 2013-06-27 HISTORY — PX: YAG LASER APPLICATION: SHX6189

## 2013-06-27 SURGERY — TREATMENT, USING YAG LASER
Anesthesia: LOCAL | Laterality: Right

## 2013-06-27 MED ORDER — CYCLOPENTOLATE HCL 1 % OP SOLN
OPHTHALMIC | Status: AC
  Filled 2013-06-27: qty 2

## 2013-06-27 MED ORDER — TROPICAMIDE 1 % OP SOLN
OPHTHALMIC | Status: AC
  Filled 2013-06-27: qty 3

## 2013-06-27 MED ORDER — TROPICAMIDE 1 % OP SOLN
1.0000 [drp] | OPHTHALMIC | Status: AC | PRN
  Administered 2013-06-27 (×3): 1 [drp] via OPHTHALMIC

## 2013-06-27 NOTE — Brief Op Note (Signed)
[name] T. Nile Riggs, MD  Procedure: Yag Capsulotomy  Yag Laser Self Test Completedyes. Procedure: Posterior Capsulotomy, Eye Protection Worn by Staff yes. Laser In Use Sign on Door yes.  Laser: Nd:YAG Spot Size: Fixed Burst Mode: III Power Setting: 3.5 mJ/burst Number of shots: 23 Total energy delivered: 81.5 mJ   The patient tolerated the procedure without difficulty. No complications were encountered.   The patient was discharged home with the instructions to continue all her current glaucoma medications, if any.   Patient instructed to go to office at 1000 for intraocular pressure check.  Patient verbalizes understanding of discharge instructions yes.   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

## 2013-06-27 NOTE — H&P (Signed)
The patient was re examined and there is no change in the patients condition since the original H and P. 

## 2013-06-30 ENCOUNTER — Telehealth: Payer: Self-pay | Admitting: *Deleted

## 2013-06-30 MED ORDER — CLOPIDOGREL BISULFATE 75 MG PO TABS: 75.0000 mg | ORAL_TABLET | Freq: Every day | ORAL | Status: DC

## 2013-06-30 NOTE — Telephone Encounter (Signed)
rx plavix to [facility]

## 2013-06-30 NOTE — Telephone Encounter (Signed)
PT NEEDS GEN PLAVIX 75MG  CALLED IN TO [facility]. PT IS OUT/TMJ

## 2013-07-03 ENCOUNTER — Ambulatory Visit (INDEPENDENT_AMBULATORY_CARE_PROVIDER_SITE_OTHER): Payer: Medicare Other | Admitting: *Deleted

## 2013-07-03 ENCOUNTER — Encounter: Payer: Self-pay | Admitting: Adult Health

## 2013-07-03 ENCOUNTER — Ambulatory Visit (INDEPENDENT_AMBULATORY_CARE_PROVIDER_SITE_OTHER): Payer: Medicare Other | Admitting: Adult Health

## 2013-07-03 VITALS — BP 144/54 | HR 64

## 2013-07-03 DIAGNOSIS — Z9889 Other specified postprocedural states: Secondary | ICD-10-CM

## 2013-07-03 DIAGNOSIS — Z7901 Long term (current) use of anticoagulants: Secondary | ICD-10-CM

## 2013-07-03 DIAGNOSIS — I5033 Acute on chronic diastolic (congestive) heart failure: Secondary | ICD-10-CM

## 2013-07-03 DIAGNOSIS — I10 Essential (primary) hypertension: Secondary | ICD-10-CM

## 2013-07-03 DIAGNOSIS — Z954 Presence of other heart-valve replacement: Secondary | ICD-10-CM

## 2013-07-03 DIAGNOSIS — I509 Heart failure, unspecified: Secondary | ICD-10-CM

## 2013-07-03 LAB — POCT INR: INR: 2.6

## 2013-07-03 MED ORDER — CLOPIDOGREL BISULFATE 75 MG PO TABS: 75.0000 mg | ORAL_TABLET | Freq: Every day | ORAL | Status: DC

## 2013-07-03 MED ORDER — TORSEMIDE 20 MG PO TABS: 40.0000 mg | ORAL_TABLET | Freq: Two times a day (BID) | ORAL | Status: DC

## 2013-07-03 MED ORDER — METOPROLOL TARTRATE 100 MG PO TABS: 100.0000 mg | ORAL_TABLET | Freq: Two times a day (BID) | ORAL | Status: DC

## 2013-07-03 NOTE — Assessment & Plan Note (Signed)
He is well compensated today, but has LEE. I think this is related to the amlodipine but the edema is pre-tibal 2+. I will change his lasix 80 mg BID to Torsemide 40 mg BID. Will repeat BMET in one week. He will be established with Dr. Wyline Mood on follow up.

## 2013-07-03 NOTE — Patient Instructions (Signed)
Your physician recommends that you schedule a follow-up appointment in: 2 months with Dr Wyline Mood  Your physician has recommended you make the following change in your medication:  1. STOP LASIX 2. START Demadex 40 mg twice a day.  Your physician recommends that you return for lab work in a week. BMET  Your physician recommends that you weigh, daily, at the same time every day, and in the same amount of clothing. Please record your daily weights on the handout provided and bring it to your next appointment.

## 2013-07-03 NOTE — Assessment & Plan Note (Signed)
Essentially controlled today. Continue current medications.

## 2013-07-03 NOTE — Progress Notes (Signed)
HPI: Mr. Ewalt is a 67 year old former patient of Dr. wall we are following for ongoing assessment and management of CAD, atrial fibrillation on chronic Coumadin, was also history of aortic and mitral valve repair with chemical prosthetic valves. The patient also has a history of a drug-eluting stent to the right coronary artery and is being treated 2 on dual antiplatelet therapy.  The patient was recently hospitalized in the setting of hyponatremia, COPD, and decompensated CHF. The patient has a history of chronic hyponatremia. During hospitalization he was started on Lasix and improved. He was switched back to oral . Was also found to be hyperkalemic and was treated with Kayexalate. On discharge sodium level was 129. Weight on discharge 160.8 ounces.  He is still complaining of fluid retention in his LEE. He is to see Misty Stanley today for INR check. He denies worsening shortness of breath.  No Known Allergies  Current Outpatient Prescriptions  Medication Sig Dispense Refill  . acetaminophen (TYLENOL) 325 MG tablet Take 2 tablets (650 mg total) by mouth every 6 (six) hours as needed. For pain  30 tablet  0  . amLODipine (NORVASC) 10 MG tablet TAKE ONE TABLET BY MOUTH AT BEDTIME  30 tablet  6  . atorvastatin (LIPITOR) 40 MG tablet Take 40 mg by mouth daily.      . clopidogrel (PLAVIX) 75 MG tablet Take 1 tablet (75 mg total) by mouth daily with breakfast.  30 tablet  6  . ferrous sulfate 325 (65 FE) MG EC tablet Take 325 mg by mouth daily with breakfast.      . folic acid (FOLVITE) 1 MG tablet Take 1 tablet (1 mg total) by mouth daily.  30 tablet  4  . furosemide (LASIX) 40 MG tablet Take 2 tablets (80 mg total) by mouth 2 (two) times daily.  60 tablet  6  . HYDROcodone-acetaminophen (LORTAB) 10-500 MG per tablet Take 1 tablet by mouth 4 times daily as needed for pain.       Marland Kitchen KLOR-CON M20 20 MEQ tablet       . levalbuterol (XOPENEX) 0.63 MG/3ML nebulizer solution Take 3 mLs (0.63 mg total) by  nebulization every 6 (six) hours as needed for wheezing or shortness of breath.  3 mL  4  . magnesium oxide (MAG-OX) 400 (241.3 MG) MG tablet Take 1 tablet (400 mg total) by mouth daily.  30 tablet  6  . metoprolol (LOPRESSOR) 100 MG tablet Take 1 tablet (100 mg total) by mouth 2 (two) times daily.  60 tablet  6  . metoprolol (LOPRESSOR) 50 MG tablet TAKE ONE TABLET BY MOUTH TWICE DAILY  180 tablet  6  . Multiple Vitamin (MULTIVITAMIN WITH MINERALS) TABS Take 1 tablet by mouth daily.      . nitroGLYCERIN (NITROSTAT) 0.4 MG SL tablet Place 1 tablet (0.4 mg total) under the tongue every 5 (five) minutes x 3 doses as needed for chest pain.  25 tablet  3  . pantoprazole (PROTONIX) 40 MG tablet Take 1 tablet (40 mg total) by mouth daily.  30 tablet  6  . thiamine 100 MG tablet Take 1 tablet (100 mg total) by mouth daily.  30 tablet  4  . warfarin (COUMADIN) 5 MG tablet Take 5-7.5 mg by mouth See admin instructions. Take 1.5 tab (7.5 mg) monday Wednesday Friday, and 5 mg on Tuesday, Thursday, Saturday Sunday       No current facility-administered medications for this visit.    Past Medical  History  Diagnosis Date  . Hypertension   . CAD (coronary artery disease) 2003    a. CABG/MVR/AVR-2003 (#25 St. Jude/#21 St. Jude); anticoagulation; b. negative stress nuclear-2005;  c. 01/2013 NSTEMI/Cath/PCI: LM nl, LAD 50p, 40-32m, D1 50ost, LCX 23m, RCA 50/38m (2.5x20 Promus DES), 50d, VG->Diag 100, VG->PDA 100, VG->OM 100, LIMA->LAD ok.  . Epistaxis     requiring cautery & aterial ligation-09/2009  . GERD (gastroesophageal reflux disease)   . Hyperlipemia   . Abnormal LFTs     possible cirrhosis  . Chronic anticoagulation   . Valvular heart disease     a. 2003: MVR/AVR-2003 (#25 St. Jude/#21 St. Jude);  b. 01/2013 Echo: EF 55%, Mech AVR mean grad 12, Mech MVR mean grad 6.  . Tobacco abuse     45 pack years  . Fasting hyperglycemia   . Nephrolithiasis   . Diverticulosis   . Hyponatremia   . Chronic  diastolic CHF (congestive heart failure)   . Chronic renal disease   . Hemolytic anemia     history of  . ETOH abuse   . A-fib 01/16/2013    a. on coumadin  . COPD (chronic obstructive pulmonary disease)     CONTROLED    Past Surgical History  Procedure Laterality Date  . Endocopic sphenopalatine artery ligation & cautry    . Inguinal hernia repair      Left & right  . Coronary artery bypass graft  11/2001    [facility]  . Cardiac valve replacement  11/2001    AVR and MVR-St. Jude devices  . Cataract extraction w/phaco  01/20/2011    Procedure: CATARACT EXTRACTION PHACO AND INTRAOCULAR LENS PLACEMENT (IOC);  Surgeon: Loraine Leriche T. Nile Riggs;  Location: [facility];  Service: Ophthalmology;  Laterality: Right;  CDE: 8.51  . Cataract extraction w/phaco  02/03/2011    Procedure: CATARACT EXTRACTION PHACO AND INTRAOCULAR LENS PLACEMENT (IOC);  Surgeon: Loraine Leriche T. Nile Riggs;  Location: [facility];  Service: Ophthalmology;  Laterality: Left;  CDE:10.01  . Colonoscopy  04/05/02; 08/2011    friable anal canal hemorrhoids otherwise normal; 2 diminutive polyps excised, minimal diverticulosis noted  . Esophagogastroduodenoscopy  01/2002    Dr. Karilyn Cota, submucosal esophageal lesion c/w leiomyoma  . Colonoscopy  09/02/2011    Procedure: COLONOSCOPY;  Surgeon: Corbin Ade, MD;  Location: [facility];  Service: Endoscopy;  Laterality: N/A;  8:15  . Yag laser application Right 06/27/2013    Procedure: YAG LASER APPLICATION;  Surgeon: Loraine Leriche T. Nile Riggs, MD;  Location: [facility];  Service: Ophthalmology;  Laterality: Right;    ZOX:WRUEAV of systems complete and found to be negative unless listed above  PHYSICAL EXAM BP 144/54  Pulse 64  General: Well developed, well nourished, in no acute distress Head: Eyes PERRLA, No xanthomas.   Normal cephalic and atramatic  Lungs: Clear bilaterally to auscultation with expiratory wheezes. No coughing.Marland Kitchen Heart: HRRR S1 S2, with mechanical click..  Pulses are 2+ & equal.             No carotid bruit. No JVD.  No abdominal bruits. No femoral bruits. Abdomen: Bowel sounds are positive, abdomen soft and non-tender without masses or                  Hernia's noted. Msk:  Back normal, normal gait. Normal strength and tone for age. Extremities: No clubbing, cyanosis, 2+ pre-edema.  DP +1 Neuro: Alert and oriented X 3. Psych:  Good affect, responds appropriately  ASSESSMENT AND PLAN

## 2013-07-03 NOTE — Progress Notes (Deleted)
Name: [name]    DOB: January 20, 1946  Age: 67 y.o.  MR#: [id number]       PCP:  Fredirick Maudlin, MD      Insurance: Payor: [employer] MEDICARE / Plan: AARP MEDICARE COMPLETE / Product Type: *No Product type* /   CC:    Chief Complaint  Patient presents with  . Coronary Artery Disease  . Hypertension    VS Filed Vitals:   07/03/13 1429  BP: 144/54  Pulse: 64    Weights Current Weight  06/01/13 160 lb 8 oz (72.802 kg)  03/30/13 157 lb 12 oz (71.555 kg)  02/10/13 155 lb (70.308 kg)    Blood Pressure  BP Readings from Last 3 Encounters:  07/03/13 144/54  06/01/13 133/41  03/30/13 146/61     Admit date:  (Not on file) Last encounter with RMR:  03/30/2013   Allergy Review of patient's allergies indicates no known allergies.  Current Outpatient Prescriptions  Medication Sig Dispense Refill  . acetaminophen (TYLENOL) 325 MG tablet Take 2 tablets (650 mg total) by mouth every 6 (six) hours as needed. For pain  30 tablet  0  . amLODipine (NORVASC) 10 MG tablet TAKE ONE TABLET BY MOUTH AT BEDTIME  30 tablet  6  . atorvastatin (LIPITOR) 40 MG tablet Take 40 mg by mouth daily.      . clopidogrel (PLAVIX) 75 MG tablet Take 1 tablet (75 mg total) by mouth daily with breakfast.  30 tablet  6  . ferrous sulfate 325 (65 FE) MG EC tablet Take 325 mg by mouth daily with breakfast.      . folic acid (FOLVITE) 1 MG tablet Take 1 tablet (1 mg total) by mouth daily.  30 tablet  4  . furosemide (LASIX) 40 MG tablet Take 2 tablets (80 mg total) by mouth 2 (two) times daily.  60 tablet  6  . HYDROcodone-acetaminophen (LORTAB) 10-500 MG per tablet Take 1 tablet by mouth 4 times daily as needed for pain.       Marland Kitchen KLOR-CON M20 20 MEQ tablet       . levalbuterol (XOPENEX) 0.63 MG/3ML nebulizer solution Take 3 mLs (0.63 mg total) by nebulization every 6 (six) hours as needed for wheezing or shortness of breath.  3 mL  4  . magnesium oxide (MAG-OX) 400 (241.3 MG) MG tablet Take 1 tablet (400  mg total) by mouth daily.  30 tablet  6  . metoprolol (LOPRESSOR) 100 MG tablet Take 1 tablet (100 mg total) by mouth 2 (two) times daily.  60 tablet  6  . metoprolol (LOPRESSOR) 50 MG tablet TAKE ONE TABLET BY MOUTH TWICE DAILY  180 tablet  6  . Multiple Vitamin (MULTIVITAMIN WITH MINERALS) TABS Take 1 tablet by mouth daily.      . nitroGLYCERIN (NITROSTAT) 0.4 MG SL tablet Place 1 tablet (0.4 mg total) under the tongue every 5 (five) minutes x 3 doses as needed for chest pain.  25 tablet  3  . pantoprazole (PROTONIX) 40 MG tablet Take 1 tablet (40 mg total) by mouth daily.  30 tablet  6  . thiamine 100 MG tablet Take 1 tablet (100 mg total) by mouth daily.  30 tablet  4  . warfarin (COUMADIN) 5 MG tablet Take 5-7.5 mg by mouth See admin instructions. Take 1.5 tab (7.5 mg) monday Wednesday Friday, and 5 mg on Tuesday, Thursday, Saturday Sunday       No current facility-administered medications for this visit.  Discontinued Meds:   There are no discontinued medications.  Patient Active Problem List   Diagnosis Date Noted  . NSVT (nonsustained ventricular tachycardia) 01/31/2013  . Hypomagnesemia 01/31/2013  . Paroxysmal SVT (supraventricular tachycardia) 01/30/2013  . Abnormal urinalysis 01/30/2013  . Acute on chronic diastolic CHF (congestive heart failure), NYHA class 4 01/30/2013  . Non-ST elevation myocardial infarction (NSTEMI), initial episode of care 01/30/2013  . Non-sustained ventricular tachycardia 01/17/2013  . Acute respiratory failure with hypoxia 01/17/2013  . Chest pain 01/16/2013  . NSTEMI (non-ST elevated myocardial infarction) 01/16/2013  . Arteriosclerotic cardiovascular disease (ASCVD)   . Hypertension   . Epistaxis   . GERD (gastroesophageal reflux disease)   . Abnormal LFTs   . Fasting hyperglycemia   . Nephrolithiasis   . Chronic anticoagulation 12/22/2010  . HYPONATREMIA, CHRONIC 04/06/2010  . HYPERLIPIDEMIA 02/20/2010  . ANEMIA 02/20/2010  . TOBACCO  ABUSE 02/20/2010  . CHRONIC OBSTRUCTIVE PULMONARY DISEASE 02/20/2010  . DIVERTICULOSIS, COLON 02/20/2010  . MITRAL VALVE REPLACEMENT, HX OF 02/20/2010  . AORTIC VALVE REPLACEMENT, HX OF 02/20/2010    LABS    Component Value Date/Time   NA 127* 06/01/2013 0613   NA 126* 05/31/2013 0559   NA 126* 05/30/2013 2100   K 4.5 06/01/2013 1326   K 5.2* 06/01/2013 1050   K 5.5* 06/01/2013 0613   CL 91* 06/01/2013 0613   CL 90* 05/31/2013 0559   CL 91* 05/30/2013 2100   CO2 29 06/01/2013 0613   CO2 28 05/31/2013 0559   CO2 27 05/30/2013 2100   GLUCOSE 93 06/01/2013 0613   GLUCOSE 100* 05/31/2013 0559   GLUCOSE 97 05/30/2013 2100   BUN 47* 06/01/2013 0613   BUN 44* 05/31/2013 0559   BUN 46* 05/30/2013 2100   CREATININE 1.53* 06/01/2013 0613   CREATININE 1.61* 05/31/2013 0559   CREATININE 2.18* 05/30/2013 2100   CREATININE 0.84 05/18/2011 0710   CALCIUM 9.3 06/01/2013 0613   CALCIUM 9.2 05/31/2013 0559   CALCIUM 8.9 05/30/2013 2100   GFRNONAA 45* 06/01/2013 0613   GFRNONAA 43* 05/31/2013 0559   GFRNONAA 30* 05/30/2013 2100   GFRAA 53* 06/01/2013 0613   GFRAA 49* 05/31/2013 0559   GFRAA 34* 05/30/2013 2100   CMP     Component Value Date/Time   NA 127* 06/01/2013 0613   K 4.5 06/01/2013 1326   CL 91* 06/01/2013 0613   CO2 29 06/01/2013 0613   GLUCOSE 93 06/01/2013 0613   BUN 47* 06/01/2013 0613   CREATININE 1.53* 06/01/2013 0613   CREATININE 0.84 05/18/2011 0710   CALCIUM 9.3 06/01/2013 0613   PROT 7.6 05/29/2013 0826   ALBUMIN 3.8 05/29/2013 0826   AST 23 05/29/2013 0826   ALT 14 05/29/2013 0826   ALKPHOS 137* 05/29/2013 0826   BILITOT 0.8 05/29/2013 0826   GFRNONAA 45* 06/01/2013 0613   GFRAA 53* 06/01/2013 0613       Component Value Date/Time   WBC 5.9 05/30/2013 0522   WBC 7.3 05/29/2013 0826   WBC 7.5 01/31/2013 0450   HGB 9.2* 05/30/2013 0522   HGB 9.7* 05/29/2013 0826   HGB 8.6* 01/31/2013 0450   HCT 26.2* 05/30/2013 0522   HCT 27.6* 05/29/2013 0826    HCT 26.7* 01/31/2013 0450   MCV 93.6 05/30/2013 0522   MCV 92.9 05/29/2013 0826   MCV 86.7 01/31/2013 0450    Lipid Panel     Component Value Date/Time   CHOL 147 01/28/2013 0119   TRIG 47 01/28/2013 0119   HDL  64 01/28/2013 0119   CHOLHDL 2.3 01/28/2013 0119   VLDL 9 01/28/2013 0119   LDLCALC 74 01/28/2013 0119   LDLCALC 62 05/17/2008    ABG    Component Value Date/Time   TCO2 23 08/01/2009 1327     No results found for this basename: TSH   BNP (last 3 results)  Recent Labs  01/20/13 0410 01/27/13 0705 05/29/13 0826  PROBNP 6499.0* 5719.0* 6106.0*   Cardiac Panel (last 3 results) No results found for this basename: CKTOTAL, CKMB, TROPONINI, RELINDX,  in the last 72 hours  Iron/TIBC/Ferritin    Component Value Date/Time   IRON 171* 07/24/2009 0552   TIBC 356 07/24/2009 0552   FERRITIN 224 07/24/2009 0552     EKG Orders placed during the hospital encounter of 05/29/13  . ED EKG  . ED EKG  . EKG 12-LEAD  . EKG 12-LEAD  . EKG     Prior Assessment and Plan Problem List as of 07/03/2013   HYPERLIPIDEMIA   Last Assessment & Plan   03/22/2012 Office Visit Written 03/22/2012  1:33 PM by Jodelle Gross, NP     I have advised him smoking cessation would help with cholesterol status. He will have followup labs completed by Dr. Juanetta Gosling next week. He verbalizes understanding.    HYPONATREMIA, CHRONIC   Last Assessment & Plan   03/19/2011 Office Visit Written 03/19/2011 12:52 PM by Kathlen Brunswick, MD     Electrolytes will be reassessed.    ANEMIA   Last Assessment & Plan   08/10/2011 Office Visit Written 08/10/2011 12:32 PM by Tiffany Kocher, PA     Chronic anemia. Heme neg X 3 in 05/2011, Dr. Dietrich Pates. 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 Colonoscopy in near future.  I have discussed the risks, alternatives, benefits with regards to but not limited to the risk of reaction to medication, bleeding, infection, perforation and the patient is agreeable to proceed. Written consent to be  obtained.     TOBACCO ABUSE   Last Assessment & Plan   03/30/2013 Office Visit Written 03/30/2013  3:52 PM by Jodelle Gross, NP     Unfortunately continues to smoke a minimum of one half pack of cigarettes a day. He is not inclined to quit. I reiterated the need to do so with his cardiovascular issues. He verbalizes understanding.    CHRONIC OBSTRUCTIVE PULMONARY DISEASE   Last Assessment & Plan   03/19/2011 Office Visit Written 03/19/2011 12:48 PM by Kathlen Brunswick, MD     Unfortunately, patient is not inclined to attempt to discontinue cigarette smoking.    DIVERTICULOSIS, COLON   MITRAL VALVE REPLACEMENT, HX OF   AORTIC VALVE REPLACEMENT, HX OF   Last Assessment & Plan   02/10/2013 Office Visit Written 02/10/2013  3:32 PM by Jodelle Gross, NP     He continues on coumadin therapy. He will follow up in coumadin clinic for ongoing assessment of INR and dosing.    Chronic anticoagulation   Last Assessment & Plan   08/10/2011 Office Visit Edited 08/10/2011 12:47 PM by Tiffany Kocher, PA     Will likely need Lovenox bridge. Sent question to Dr. Marvel Plan nurse, Tomi Cheek. Await response.     Arteriosclerotic cardiovascular disease (ASCVD)   Last Assessment & Plan   03/30/2013 Office Visit Written 03/30/2013  3:52 PM by Jodelle Gross, NP     He is without cardiac complaint currently. No chest discomfort, dyspnea on exertion, or weakness. He  would continue with risk management. In ongoing counseling concerning his tobacco abuse.    Hypertension   Last Assessment & Plan   03/22/2012 Office Visit Written 03/22/2012  1:32 PM by Jodelle Gross, NP     After control of blood pressure on current medication regimen. He will continue metoprolol, lisinopril, and amlodipine. Followup labs will be drawn next week per Dr. Juanetta Gosling office. Will receive copies of same were reviewed in the chart.    Epistaxis   Last Assessment & Plan   03/19/2011 Office Visit Written 03/19/2011 12:49 PM by  Kathlen Brunswick, MD     No recurrent problems since surgery undertaken 18 months ago.    GERD (gastroesophageal reflux disease)   Abnormal LFTs   Last Assessment & Plan   08/10/2011 Office Visit Edited 08/10/2011 12:42 PM by Tiffany Kocher, PA     Chronically elevated alk phos in setting of daily etoh use. Need to rule out PBC. No cirrhosis on abd u/s 2007. No splenomegaly on exam. Platelet counts are normal. Cannot rule out underlying cirrhosis. Consider updating abd u/s if patient agreeable.   Will also check AMA, GGT.   If patient has cirrhosis, would advise egd to r/o varices, which could be done at time of tcs.    Fasting hyperglycemia   Last Assessment & Plan   03/19/2011 Office Visit Written 03/19/2011 12:49 PM by Kathlen Brunswick, MD     Repeat FBS will be obtained.    Nephrolithiasis   Chest pain   NSTEMI (non-ST elevated myocardial infarction)   Last Assessment & Plan   02/10/2013 Office Visit Written 02/10/2013  3:27 PM by Jodelle Gross, NP     He is without cardiac complaint. He is medically compliant. He will continue with risk factor management with close follow up. No changes in his medications regimen.    Non-sustained ventricular tachycardia   Acute respiratory failure with hypoxia   Paroxysmal SVT (supraventricular tachycardia)   Abnormal urinalysis   Acute on chronic diastolic CHF (congestive heart failure), NYHA class 4   Last Assessment & Plan   03/30/2013 Office Visit Written 03/30/2013  3:53 PM by Jodelle Gross, NP     He appears well compensated. I am hearing some wheezing however I believe this is more related to his lung disease. There is no evidence of fluid retention. His weight is stable.    Non-ST elevation myocardial infarction (NSTEMI), initial episode of care   NSVT (nonsustained ventricular tachycardia)   Hypomagnesemia       Imaging: No results found.

## 2013-07-03 NOTE — Assessment & Plan Note (Signed)
Seeing Vashti Hey RN today in coumadin clinic. She will manage his dosing.

## 2013-07-11 LAB — BASIC METABOLIC PANEL
BUN: 35 mg/dL — ABNORMAL HIGH (ref 6–23)
CO2: 30 mEq/L (ref 19–32)
Calcium: 9.1 mg/dL (ref 8.4–10.5)
Chloride: 95 mEq/L — ABNORMAL LOW (ref 96–112)
Creat: 1.39 mg/dL — ABNORMAL HIGH (ref 0.50–1.35)
Glucose, Bld: 114 mg/dL — ABNORMAL HIGH (ref 70–99)
Potassium: 5 mEq/L (ref 3.5–5.3)
Sodium: 134 mEq/L — ABNORMAL LOW (ref 135–145)

## 2013-07-20 ENCOUNTER — Telehealth: Payer: Self-pay | Admitting: *Deleted

## 2013-07-20 MED ORDER — TORSEMIDE 20 MG PO TABS: 40.0000 mg | ORAL_TABLET | Freq: Two times a day (BID) | ORAL | Status: DC

## 2013-07-20 NOTE — Telephone Encounter (Signed)
TORSIMIDE WAS CALLED IN FOR 60 TAB LAST REFILL AND SHOULD HAVE BEEN FOR 120 HE TAKES TWO TWICE A DAY. PLEASE CALL IN CORRECT QUANTITY TODAY HE IS OUT.  STATES HE CALLED THE OTHER DAY FOR SAME THING   [facility] IN [place]

## 2013-07-20 NOTE — Telephone Encounter (Signed)
rx for torsemide #120 to [facility]

## 2013-07-23 ENCOUNTER — Inpatient Hospital Stay (HOSPITAL_COMMUNITY): Payer: Medicare Other

## 2013-07-23 ENCOUNTER — Inpatient Hospital Stay (HOSPITAL_COMMUNITY)
Admission: EM | Admit: 2013-07-23 | Discharge: 2013-07-30 | DRG: 280 | Disposition: A | Discharge status: Home / Self Care | Payer: Medicare Other | Attending: Internal Medicine | Admitting: Internal Medicine

## 2013-07-23 ENCOUNTER — Emergency Department (HOSPITAL_COMMUNITY): Payer: Medicare Other

## 2013-07-23 ENCOUNTER — Encounter (HOSPITAL_COMMUNITY): Payer: Self-pay | Admitting: Emergency Medicine

## 2013-07-23 DIAGNOSIS — Z91199 Patient's noncompliance with other medical treatment and regimen due to unspecified reason: Secondary | ICD-10-CM

## 2013-07-23 DIAGNOSIS — J449 Chronic obstructive pulmonary disease, unspecified: Secondary | ICD-10-CM

## 2013-07-23 DIAGNOSIS — F17200 Nicotine dependence, unspecified, uncomplicated: Secondary | ICD-10-CM

## 2013-07-23 DIAGNOSIS — J9600 Acute respiratory failure, unspecified whether with hypoxia or hypercapnia: Secondary | ICD-10-CM

## 2013-07-23 DIAGNOSIS — Z9849 Cataract extraction status, unspecified eye: Secondary | ICD-10-CM

## 2013-07-23 DIAGNOSIS — I129 Hypertensive chronic kidney disease with stage 1 through stage 4 chronic kidney disease, or unspecified chronic kidney disease: Secondary | ICD-10-CM | POA: Diagnosis present

## 2013-07-23 DIAGNOSIS — K219 Gastro-esophageal reflux disease without esophagitis: Secondary | ICD-10-CM

## 2013-07-23 DIAGNOSIS — I5033 Acute on chronic diastolic (congestive) heart failure: Secondary | ICD-10-CM

## 2013-07-23 DIAGNOSIS — N189 Chronic kidney disease, unspecified: Secondary | ICD-10-CM | POA: Diagnosis present

## 2013-07-23 DIAGNOSIS — K5730 Diverticulosis of large intestine without perforation or abscess without bleeding: Secondary | ICD-10-CM

## 2013-07-23 DIAGNOSIS — I498 Other specified cardiac arrhythmias: Secondary | ICD-10-CM | POA: Diagnosis present

## 2013-07-23 DIAGNOSIS — J9601 Acute respiratory failure with hypoxia: Secondary | ICD-10-CM

## 2013-07-23 DIAGNOSIS — Z7901 Long term (current) use of anticoagulants: Secondary | ICD-10-CM

## 2013-07-23 DIAGNOSIS — I4891 Unspecified atrial fibrillation: Secondary | ICD-10-CM

## 2013-07-23 DIAGNOSIS — D649 Anemia, unspecified: Secondary | ICD-10-CM

## 2013-07-23 DIAGNOSIS — I509 Heart failure, unspecified: Secondary | ICD-10-CM

## 2013-07-23 DIAGNOSIS — Z9861 Coronary angioplasty status: Secondary | ICD-10-CM

## 2013-07-23 DIAGNOSIS — Z952 Presence of prosthetic heart valve: Secondary | ICD-10-CM

## 2013-07-23 DIAGNOSIS — Z954 Presence of other heart-valve replacement: Secondary | ICD-10-CM

## 2013-07-23 DIAGNOSIS — I214 Non-ST elevation (NSTEMI) myocardial infarction: Secondary | ICD-10-CM

## 2013-07-23 DIAGNOSIS — Z79899 Other long term (current) drug therapy: Secondary | ICD-10-CM

## 2013-07-23 DIAGNOSIS — Z951 Presence of aortocoronary bypass graft: Secondary | ICD-10-CM

## 2013-07-23 DIAGNOSIS — J4489 Other specified chronic obstructive pulmonary disease: Secondary | ICD-10-CM

## 2013-07-23 DIAGNOSIS — J9620 Acute and chronic respiratory failure, unspecified whether with hypoxia or hypercapnia: Secondary | ICD-10-CM | POA: Diagnosis present

## 2013-07-23 DIAGNOSIS — N200 Calculus of kidney: Secondary | ICD-10-CM

## 2013-07-23 DIAGNOSIS — Z961 Presence of intraocular lens: Secondary | ICD-10-CM

## 2013-07-23 DIAGNOSIS — E871 Hypo-osmolality and hyponatremia: Secondary | ICD-10-CM

## 2013-07-23 DIAGNOSIS — E785 Hyperlipidemia, unspecified: Secondary | ICD-10-CM

## 2013-07-23 DIAGNOSIS — G9349 Other encephalopathy: Secondary | ICD-10-CM | POA: Diagnosis present

## 2013-07-23 DIAGNOSIS — F1010 Alcohol abuse, uncomplicated: Secondary | ICD-10-CM | POA: Diagnosis present

## 2013-07-23 DIAGNOSIS — I469 Cardiac arrest, cause unspecified: Secondary | ICD-10-CM | POA: Diagnosis present

## 2013-07-23 DIAGNOSIS — Z9119 Patient's noncompliance with other medical treatment and regimen: Secondary | ICD-10-CM

## 2013-07-23 DIAGNOSIS — Z9889 Other specified postprocedural states: Secondary | ICD-10-CM

## 2013-07-23 DIAGNOSIS — I2510 Atherosclerotic heart disease of native coronary artery without angina pectoris: Secondary | ICD-10-CM

## 2013-07-23 DIAGNOSIS — I252 Old myocardial infarction: Secondary | ICD-10-CM

## 2013-07-23 DIAGNOSIS — Z7902 Long term (current) use of antithrombotics/antiplatelets: Secondary | ICD-10-CM

## 2013-07-23 DIAGNOSIS — I10 Essential (primary) hypertension: Secondary | ICD-10-CM

## 2013-07-23 DIAGNOSIS — R570 Cardiogenic shock: Secondary | ICD-10-CM | POA: Diagnosis present

## 2013-07-23 DIAGNOSIS — D638 Anemia in other chronic diseases classified elsewhere: Secondary | ICD-10-CM | POA: Diagnosis present

## 2013-07-23 LAB — CBC WITH DIFFERENTIAL/PLATELET
Basophils Absolute: 0.1 10*3/uL (ref 0.0–0.1)
Basophils Relative: 1 % (ref 0–1)
EOS ABS: 0.1 10*3/uL (ref 0.0–0.7)
Eosinophils Relative: 1 % (ref 0–5)
HCT: 35.8 % — ABNORMAL LOW (ref 39.0–52.0)
HEMOGLOBIN: 12.3 g/dL — AB (ref 13.0–17.0)
Lymphocytes Relative: 4 % — ABNORMAL LOW (ref 12–46)
Lymphs Abs: 0.3 10*3/uL — ABNORMAL LOW (ref 0.7–4.0)
MCH: 35 pg — AB (ref 26.0–34.0)
MCHC: 34.4 g/dL (ref 30.0–36.0)
MCV: 102 fL — AB (ref 78.0–100.0)
MONOS PCT: 11 % (ref 3–12)
Monocytes Absolute: 0.8 10*3/uL (ref 0.1–1.0)
NEUTROS ABS: 6.5 10*3/uL (ref 1.7–7.7)
Neutrophils Relative %: 84 % — ABNORMAL HIGH (ref 43–77)
Platelets: 325 10*3/uL (ref 150–400)
RBC: 3.51 MIL/uL — ABNORMAL LOW (ref 4.22–5.81)
RDW: 14.2 % (ref 11.5–15.5)
WBC: 7.8 10*3/uL (ref 4.0–10.5)

## 2013-07-23 LAB — COMPREHENSIVE METABOLIC PANEL
ALT: 30 U/L (ref 0–53)
AST: 52 U/L — ABNORMAL HIGH (ref 0–37)
Albumin: 3.6 g/dL (ref 3.5–5.2)
Alkaline Phosphatase: 142 U/L — ABNORMAL HIGH (ref 39–117)
BILIRUBIN TOTAL: 0.9 mg/dL (ref 0.3–1.2)
BUN: 30 mg/dL — ABNORMAL HIGH (ref 6–23)
CHLORIDE: 95 meq/L — AB (ref 96–112)
CO2: 20 mEq/L (ref 19–32)
CREATININE: 1.16 mg/dL (ref 0.50–1.35)
Calcium: 8.7 mg/dL (ref 8.4–10.5)
GFR calc Af Amer: 73 mL/min — ABNORMAL LOW (ref >90)
GFR calc non Af Amer: 63 mL/min — ABNORMAL LOW (ref >90)
Glucose, Bld: 106 mg/dL — ABNORMAL HIGH (ref 70–99)
Potassium: 4.4 mEq/L (ref 3.7–5.3)
Sodium: 135 mEq/L — ABNORMAL LOW (ref 137–147)
Total Protein: 7.4 g/dL (ref 6.0–8.3)

## 2013-07-23 LAB — POCT I-STAT 3, ART BLOOD GAS (G3+)
ACID-BASE DEFICIT: 2 mmol/L (ref 0.0–2.0)
Bicarbonate: 26.9 mEq/L — ABNORMAL HIGH (ref 20.0–24.0)
O2 Saturation: 93 %
PH ART: 7.272 — AB (ref 7.350–7.450)
TCO2: 29 mmol/L (ref 0–100)
pCO2 arterial: 57.2 mmHg (ref 35.0–45.0)
pO2, Arterial: 70 mmHg — ABNORMAL LOW (ref 80.0–100.0)

## 2013-07-23 LAB — GLUCOSE, CAPILLARY
GLUCOSE-CAPILLARY: 136 mg/dL — AB (ref 70–99)
Glucose-Capillary: 176 mg/dL — ABNORMAL HIGH (ref 70–99)
Glucose-Capillary: 206 mg/dL — ABNORMAL HIGH (ref 70–99)

## 2013-07-23 LAB — URINALYSIS, ROUTINE W REFLEX MICROSCOPIC
Bilirubin Urine: NEGATIVE
Glucose, UA: NEGATIVE mg/dL
KETONES UR: NEGATIVE mg/dL
LEUKOCYTES UA: NEGATIVE
Nitrite: NEGATIVE
PROTEIN: 100 mg/dL — AB
Specific Gravity, Urine: 1.025 (ref 1.005–1.030)
Urobilinogen, UA: 0.2 mg/dL (ref 0.0–1.0)
pH: 5.5 (ref 5.0–8.0)

## 2013-07-23 LAB — LACTIC ACID, PLASMA
LACTIC ACID, VENOUS: 1.4 mmol/L (ref 0.5–2.2)
Lactic Acid, Venous: 5.2 mmol/L — ABNORMAL HIGH (ref 0.5–2.2)

## 2013-07-23 LAB — BLOOD GAS, ARTERIAL
Acid-base deficit: 3.8 mmol/L — ABNORMAL HIGH (ref 0.0–2.0)
Bicarbonate: 23.2 mEq/L (ref 20.0–24.0)
FIO2: 100 %
LHR: 12 {breaths}/min
MECHVT: 500 mL
O2 Content: 100 L/min
O2 Saturation: 90.8 %
PATIENT TEMPERATURE: 95.8
PEEP/CPAP: 5 cmH2O
TCO2: 22.2 mmol/L (ref 0–100)
pCO2 arterial: 62 mmHg (ref 35.0–45.0)
pH, Arterial: 7.197 — CL (ref 7.350–7.450)
pO2, Arterial: 80.4 mmHg (ref 80.0–100.0)

## 2013-07-23 LAB — URINE MICROSCOPIC-ADD ON

## 2013-07-23 LAB — PROTIME-INR
INR: 3.54 — ABNORMAL HIGH (ref 0.00–1.49)
PROTHROMBIN TIME: 34.1 s — AB (ref 11.6–15.2)

## 2013-07-23 LAB — TROPONIN I
TROPONIN I: 4.35 ng/mL — AB (ref <0.30)
Troponin I: 1.32 ng/mL (ref <0.30)

## 2013-07-23 LAB — PRO B NATRIURETIC PEPTIDE: Pro B Natriuretic peptide (BNP): 8081 pg/mL — ABNORMAL HIGH (ref 0–125)

## 2013-07-23 LAB — MRSA PCR SCREENING: MRSA by PCR: NEGATIVE

## 2013-07-23 MED ORDER — PROPOFOL 10 MG/ML IV EMUL
5.0000 ug/kg/min | INTRAVENOUS | Status: DC
  Administered 2013-07-23: 27.78 ug/kg/min via INTRAVENOUS

## 2013-07-23 MED ORDER — DEXMEDETOMIDINE HCL IN NACL 200 MCG/50ML IV SOLN
0.0000 ug/kg/h | INTRAVENOUS | Status: DC
  Administered 2013-07-23: 0.2 ug/kg/h via INTRAVENOUS
  Administered 2013-07-23 – 2013-07-24 (×3): 0.6 ug/kg/h via INTRAVENOUS
  Administered 2013-07-24: 0.5 ug/kg/h via INTRAVENOUS
  Administered 2013-07-24: 0.2 ug/kg/h via INTRAVENOUS
  Administered 2013-07-24 (×2): 0.5 ug/kg/h via INTRAVENOUS
  Administered 2013-07-24: 0.4 ug/kg/h via INTRAVENOUS
  Administered 2013-07-25: 0.5 ug/kg/h via INTRAVENOUS
  Filled 2013-07-23 (×7): qty 50

## 2013-07-23 MED ORDER — ETOMIDATE 2 MG/ML IV SOLN
INTRAVENOUS | Status: AC
  Administered 2013-07-23: 20 mg
  Filled 2013-07-23: qty 20

## 2013-07-23 MED ORDER — NOREPINEPHRINE BITARTRATE 1 MG/ML IJ SOLN
2.0000 ug/min | INTRAVENOUS | Status: DC
  Administered 2013-07-23: 5 ug/min via INTRAVENOUS
  Filled 2013-07-23: qty 4

## 2013-07-23 MED ORDER — NITROGLYCERIN IN D5W 200-5 MCG/ML-% IV SOLN
5.0000 ug/min | Freq: Once | INTRAVENOUS | Status: AC
  Administered 2013-07-23: 10 ug/min via INTRAVENOUS

## 2013-07-23 MED ORDER — BIOTENE DRY MOUTH MT LIQD
15.0000 mL | Freq: Four times a day (QID) | OROMUCOSAL | Status: DC
  Administered 2013-07-24 – 2013-07-27 (×16): 15 mL via OROMUCOSAL

## 2013-07-23 MED ORDER — CHLORHEXIDINE GLUCONATE 0.12 % MT SOLN
15.0000 mL | Freq: Two times a day (BID) | OROMUCOSAL | Status: DC
  Administered 2013-07-23 – 2013-07-27 (×8): 15 mL via OROMUCOSAL
  Filled 2013-07-23 (×8): qty 15

## 2013-07-23 MED ORDER — DOPAMINE-DEXTROSE 3.2-5 MG/ML-% IV SOLN
4.0000 ug/kg/min | Freq: Once | INTRAVENOUS | Status: DC
  Administered 2013-07-23: 4 ug/kg/min via INTRAVENOUS

## 2013-07-23 MED ORDER — IPRATROPIUM BROMIDE 0.02 % IN SOLN
0.5000 mg | Freq: Four times a day (QID) | RESPIRATORY_TRACT | Status: DC
  Administered 2013-07-23: 0.5 mg via RESPIRATORY_TRACT
  Filled 2013-07-23: qty 2.5

## 2013-07-23 MED ORDER — FUROSEMIDE 10 MG/ML IJ SOLN
40.0000 mg | Freq: Once | INTRAMUSCULAR | Status: AC
  Administered 2013-07-23: 40 mg via INTRAVENOUS

## 2013-07-23 MED ORDER — FUROSEMIDE 10 MG/ML IJ SOLN: 40.0000 mg | Freq: Once | INTRAMUSCULAR | Status: DC

## 2013-07-23 MED ORDER — FUROSEMIDE 10 MG/ML IJ SOLN
120.0000 mg | Freq: Once | INTRAMUSCULAR | Status: AC
  Administered 2013-07-23: 15:00:00 via INTRAVENOUS
  Administered 2013-07-23: 120 mg via INTRAVENOUS
  Filled 2013-07-23: qty 12

## 2013-07-23 MED ORDER — INSULIN ASPART 100 UNIT/ML ~~LOC~~ SOLN
1.0000 [IU] | SUBCUTANEOUS | Status: DC
  Administered 2013-07-23: 1 [IU] via SUBCUTANEOUS
  Administered 2013-07-23: 2 [IU] via SUBCUTANEOUS
  Administered 2013-07-23: 1 [IU] via SUBCUTANEOUS
  Administered 2013-07-24: 2 [IU] via SUBCUTANEOUS
  Administered 2013-07-24: 1 [IU] via SUBCUTANEOUS

## 2013-07-23 MED ORDER — ALBUTEROL SULFATE (2.5 MG/3ML) 0.083% IN NEBU
2.5000 mg | INHALATION_SOLUTION | Freq: Four times a day (QID) | RESPIRATORY_TRACT | Status: DC
  Administered 2013-07-23: 2.5 mg via RESPIRATORY_TRACT
  Filled 2013-07-23: qty 3

## 2013-07-23 MED ORDER — ROCURONIUM BROMIDE 50 MG/5ML IV SOLN
INTRAVENOUS | Status: AC
  Filled 2013-07-23: qty 2

## 2013-07-23 MED ORDER — FUROSEMIDE 10 MG/ML IJ SOLN
INTRAMUSCULAR | Status: AC
Start: 2013-07-23 — End: 2013-07-23
  Filled 2013-07-23: qty 4

## 2013-07-23 MED ORDER — FUROSEMIDE 10 MG/ML IJ SOLN
80.0000 mg | Freq: Once | INTRAMUSCULAR | Status: AC
  Administered 2013-07-23: 80 mg via INTRAVENOUS

## 2013-07-23 MED ORDER — MIDAZOLAM HCL 2 MG/2ML IJ SOLN
4.0000 mg | Freq: Once | INTRAMUSCULAR | Status: AC
  Administered 2013-07-23: 4 mg via INTRAVENOUS

## 2013-07-23 MED ORDER — IPRATROPIUM-ALBUTEROL 0.5-2.5 (3) MG/3ML IN SOLN
3.0000 mL | Freq: Four times a day (QID) | RESPIRATORY_TRACT | Status: DC
  Administered 2013-07-23 – 2013-07-28 (×17): 3 mL via RESPIRATORY_TRACT
  Filled 2013-07-23 (×17): qty 3

## 2013-07-23 MED ORDER — NITROGLYCERIN IN D5W 200-5 MCG/ML-% IV SOLN
15.0000 ug/min | Freq: Once | INTRAVENOUS | Status: DC
Start: 2013-07-23 — End: 2013-07-23

## 2013-07-23 MED ORDER — ALBUTEROL SULFATE (2.5 MG/3ML) 0.083% IN NEBU: 2.5000 mg | INHALATION_SOLUTION | RESPIRATORY_TRACT | Status: DC | PRN

## 2013-07-23 MED ORDER — MIDAZOLAM HCL 2 MG/2ML IJ SOLN
1.0000 mg | INTRAMUSCULAR | Status: AC | PRN
  Administered 2013-07-23 (×3): 1 mg via INTRAVENOUS
  Filled 2013-07-23 (×3): qty 2

## 2013-07-23 MED ORDER — MIDAZOLAM HCL 2 MG/2ML IJ SOLN
1.0000 mg | INTRAMUSCULAR | Status: DC | PRN
  Administered 2013-07-23 – 2013-07-24 (×8): 1 mg via INTRAVENOUS
  Filled 2013-07-23 (×7): qty 2

## 2013-07-23 MED ORDER — PANTOPRAZOLE SODIUM 40 MG IV SOLR
40.0000 mg | Freq: Every day | INTRAVENOUS | Status: DC
  Administered 2013-07-23 – 2013-07-24 (×2): 40 mg via INTRAVENOUS
  Filled 2013-07-23 (×3): qty 40

## 2013-07-23 MED ORDER — PROPOFOL 10 MG/ML IV EMUL
INTRAVENOUS | Status: AC
  Filled 2013-07-23: qty 100

## 2013-07-23 MED ORDER — LIDOCAINE HCL (CARDIAC) 20 MG/ML IV SOLN
INTRAVENOUS | Status: AC
  Filled 2013-07-23: qty 5

## 2013-07-23 MED ORDER — SUCCINYLCHOLINE CHLORIDE 20 MG/ML IJ SOLN
INTRAMUSCULAR | Status: AC
  Administered 2013-07-23: 100 mg
  Filled 2013-07-23: qty 1

## 2013-07-23 MED ORDER — DOPAMINE-DEXTROSE 3.2-5 MG/ML-% IV SOLN
INTRAVENOUS | Status: AC
  Filled 2013-07-23: qty 250

## 2013-07-23 MED ORDER — MIDAZOLAM HCL 5 MG/5ML IJ SOLN
INTRAMUSCULAR | Status: AC
  Filled 2013-07-23: qty 5

## 2013-07-23 MED ORDER — WARFARIN - PHARMACIST DOSING INPATIENT
Freq: Every day | Status: DC
  Administered 2013-07-27: 18:00:00

## 2013-07-23 MED ORDER — FENTANYL CITRATE 0.05 MG/ML IJ SOLN
50.0000 ug | INTRAMUSCULAR | Status: DC | PRN
  Administered 2013-07-23 – 2013-07-24 (×4): 50 ug via INTRAVENOUS
  Filled 2013-07-23 (×4): qty 2

## 2013-07-23 MED ORDER — SODIUM CHLORIDE 0.9 % IV SOLN: 250.0000 mL | INTRAVENOUS | Status: DC | PRN

## 2013-07-23 MED ORDER — PROPOFOL 10 MG/ML IV EMUL: 5.0000 ug/kg/min | INTRAVENOUS | Status: DC

## 2013-07-23 MED ORDER — FUROSEMIDE 10 MG/ML IJ SOLN: 80.0000 mg | Freq: Two times a day (BID) | INTRAMUSCULAR | Status: DC

## 2013-07-23 MED ORDER — FUROSEMIDE 10 MG/ML IJ SOLN
INTRAMUSCULAR | Status: AC
  Filled 2013-07-23: qty 4

## 2013-07-23 MED ORDER — FENTANYL CITRATE 0.05 MG/ML IJ SOLN
100.0000 ug | Freq: Once | INTRAMUSCULAR | Status: AC
  Administered 2013-07-23: 100 ug via INTRAVENOUS
  Filled 2013-07-23: qty 2

## 2013-07-23 MED ORDER — ASPIRIN 325 MG PO TABS
325.0000 mg | ORAL_TABLET | Freq: Every day | ORAL | Status: DC
  Administered 2013-07-23 – 2013-07-25 (×3): 325 mg
  Filled 2013-07-23 (×3): qty 1

## 2013-07-23 MED ORDER — DOPAMINE-DEXTROSE 3.2-5 MG/ML-% IV SOLN
4.0000 ug/kg/min | Freq: Once | INTRAVENOUS | Status: AC
  Administered 2013-07-23: 15 ug/kg/min via INTRAVENOUS

## 2013-07-23 MED ORDER — NITROGLYCERIN IN D5W 200-5 MCG/ML-% IV SOLN
INTRAVENOUS | Status: AC
  Filled 2013-07-23: qty 250

## 2013-07-23 NOTE — ED Notes (Signed)
Family called ems for sob. Sudden per pt around 850am. Pt arrived alert/roiented/lethargic, very pale and diaphoretic.lower leg swelling noted. Pt denies pain/chest pain. Pt arrived ems with cpap and sats only 71%. Very restless

## 2013-07-23 NOTE — Procedures (Signed)
Arterial Catheter Insertion Procedure Note [name] [id number] 1946/06/20  Procedure: Insertion of Arterial Catheter  Indications: Blood pressure monitoring  Procedure Details Consent: Risks of procedure as well as the alternatives and risks of each were explained to the (patient/caregiver).  Consent for procedure obtained. Time Out: Verified patient identification, verified procedure, site/side was marked, verified correct patient position, special equipment/implants available, medications/allergies/relevent history reviewed, required imaging and test results available.  Performed  Maximum sterile technique was used including antiseptics. Skin prep: Chlorhexidine; local anesthetic administered 20 gauge catheter was inserted into right radial artery using the Seldinger technique.  Evaluation Blood flow good; BP tracing good. Complications: No apparent complications.   Closson, Deetta Perla 07/23/2013

## 2013-07-23 NOTE — ED Provider Notes (Addendum)
CSN: [id number]     Arrival date & time 07/23/13  8299 History   This chart was scribed for Donnetta Hutching, MD, by Yevette Edwards, ED Scribe. This patient was seen in room APA02/APA02 and the patient's care was started at 9:30 AM.  None    Chief Complaint  Patient presents with  . Shortness of Breath   The history is provided by the patient, the EMS personnel and a relative. The history is limited by a developmental delay. No language interpreter was used.   Level Five Caveat: Severe Respiratory Distress   HPI Comments: [name] is a 68 y.o. Male, with a h/o COPD, CAD, and CHF, who presents to the Emergency Department via EMS presenting with worsening dyspnea. Patient transported via EMS. In emergency department, patient was dyspneic, tachypneic,  lethargic, pale, diaphoretic, restless, with lower extremity edema. No chest pain, fever, or chills.  Past Medical History  Diagnosis Date  . Hypertension   . CAD (coronary artery disease) 2003    a. CABG/MVR/AVR-2003 (#25 St. Jude/#21 St. Jude); anticoagulation; b. negative stress nuclear-2005;  c. 01/2013 NSTEMI/Cath/PCI: LM nl, LAD 50p, 40-67m, D1 50ost, LCX 33m, RCA 50/57m (2.5x20 Promus DES), 50d, VG->Diag 100, VG->PDA 100, VG->OM 100, LIMA->LAD ok.  . Epistaxis     requiring cautery & aterial ligation-09/2009  . GERD (gastroesophageal reflux disease)   . Hyperlipemia   . Abnormal LFTs     possible cirrhosis  . Chronic anticoagulation   . Valvular heart disease     a. 2003: MVR/AVR-2003 (#25 St. Jude/#21 St. Jude);  b. 01/2013 Echo: EF 55%, Mech AVR mean grad 12, Mech MVR mean grad 6.  . Tobacco abuse     45 pack years  . Fasting hyperglycemia   . Nephrolithiasis   . Diverticulosis   . Hyponatremia   . Chronic diastolic CHF (congestive heart failure)   . Chronic renal disease   . Hemolytic anemia     history of  . ETOH abuse   . A-fib 01/16/2013    a. on coumadin  . COPD (chronic obstructive pulmonary disease)     CONTROLED    Past Surgical History  Procedure Laterality Date  . Endocopic sphenopalatine artery ligation & cautry    . Inguinal hernia repair      Left & right  . Coronary artery bypass graft  11/2001    [facility]  . Cardiac valve replacement  11/2001    AVR and MVR-St. Jude devices  . Cataract extraction w/phaco  01/20/2011    Procedure: CATARACT EXTRACTION PHACO AND INTRAOCULAR LENS PLACEMENT (IOC);  Surgeon: Loraine Leriche T. Nile Riggs;  Location: [facility] ORS;  Service: Ophthalmology;  Laterality: Right;  CDE: 8.51  . Cataract extraction w/phaco  02/03/2011    Procedure: CATARACT EXTRACTION PHACO AND INTRAOCULAR LENS PLACEMENT (IOC);  Surgeon: Loraine Leriche T. Nile Riggs;  Location: [facility] ORS;  Service: Ophthalmology;  Laterality: Left;  CDE:10.01  . Colonoscopy  04/05/02; 08/2011    friable anal canal hemorrhoids otherwise normal; 2 diminutive polyps excised, minimal diverticulosis noted  . Esophagogastroduodenoscopy  01/2002    Dr. Karilyn Cota, submucosal esophageal lesion c/w leiomyoma  . Colonoscopy  09/02/2011    Procedure: COLONOSCOPY;  Surgeon: Corbin Ade, MD;  Location: [facility] END[facility] SUITE;  Service: Endoscopy;  Laterality: N/A;  8:15  . Yag laser application Right 06/27/2013    Procedure: YAG LASER APPLICATION;  Surgeon: Loraine Leriche T. Nile Riggs, MD;  Location: [facility];  Service: Ophthalmology;  Laterality: Right;   Family History  Problem Relation Age of Onset  . Hypotension Neg Hx   . Anesthesia problems Neg Hx   . Malignant hyperthermia Neg Hx   . Pseudochol deficiency Neg Hx   . Colon cancer Neg Hx   . Liver disease Neg Hx    History  Substance Use Topics  . Smoking status: Current Every Day Smoker -- 0.25 packs/day for 52 years    Types: Cigarettes  . Smokeless tobacco: Never Used     Comment: STATES HE IS CUTTING DOWN now only 1/2 ppd (01/27/2013)  . Alcohol Use: 4.2 oz/week    6 Cans of beer, 1 Shots of liquor per week     Comment: DAILY    Review of Systems  Unable to perform ROS: Severe respiratory distress     Allergies  Review of patient's allergies indicates no known allergies.  Home Medications   Current Outpatient Rx  Name  Route  Sig  Dispense  Refill  . acetaminophen (TYLENOL) 325 MG tablet   Oral   Take 2 tablets (650 mg total) by mouth every 6 (six) hours as needed. For pain   30 tablet   0   . amLODipine (NORVASC) 10 MG tablet      TAKE ONE TABLET BY MOUTH AT BEDTIME   30 tablet   6   . atorvastatin (LIPITOR) 40 MG tablet   Oral   Take 40 mg by mouth daily.         . clopidogrel (PLAVIX) 75 MG tablet   Oral   Take 1 tablet (75 mg total) by mouth daily with breakfast.   30 tablet   6   . ferrous sulfate 325 (65 FE) MG EC tablet   Oral   Take 325 mg by mouth daily with breakfast.         . folic acid (FOLVITE) 1 MG tablet   Oral   Take 1 tablet (1 mg total) by mouth daily.   30 tablet   4   . HYDROcodone-acetaminophen (LORTAB) 10-500 MG per tablet   Oral   Take 1 tablet by mouth 4 times daily as needed for pain.          Marland Kitchen KLOR-CON M20 20 MEQ tablet               . levalbuterol (XOPENEX) 0.63 MG/3ML nebulizer solution   Nebulization   Take 3 mLs (0.63 mg total) by nebulization every 6 (six) hours as needed for wheezing or shortness of breath.   3 mL   4   . magnesium oxide (MAG-OX) 400 (241.3 MG) MG tablet   Oral   Take 1 tablet (400 mg total) by mouth daily.   30 tablet   6   . metoprolol (LOPRESSOR) 100 MG tablet   Oral   Take 1 tablet (100 mg total) by mouth 2 (two) times daily.   60 tablet   6   . Multiple Vitamin (MULTIVITAMIN WITH MINERALS) TABS   Oral   Take 1 tablet by mouth daily.         . nitroGLYCERIN (NITROSTAT) 0.4 MG SL tablet   Sublingual   Place 1 tablet (0.4 mg total) under the tongue every 5 (five) minutes x 3 doses as needed for chest pain.   25 tablet   3   . pantoprazole (PROTONIX) 40 MG tablet   Oral   Take 1 tablet (40 mg total) by mouth daily.   30 tablet   6   .  thiamine 100 MG tablet   Oral    Take 1 tablet (100 mg total) by mouth daily.   30 tablet   4   . torsemide (DEMADEX) 20 MG tablet   Oral   Take 2 tablets (40 mg total) by mouth 2 (two) times daily.   120 tablet   6   . warfarin (COUMADIN) 5 MG tablet   Oral   Take 5-7.5 mg by mouth See admin instructions. Take 1.5 tab (7.5 mg) monday Wednesday Friday, and 5 mg on Tuesday, Thursday, Saturday Sunday          Triage Vitals: BP 179/78  Pulse 68  Resp 28  SpO2 71%  Physical Exam  Nursing note and vitals reviewed. Constitutional: He is oriented to person, place, and time. He appears distressed.  Severe respiratory distress, pale, diaphoretic  HENT:  Head: Normocephalic and atraumatic.  Eyes: Conjunctivae and EOM are normal. Pupils are equal, round, and reactive to light.  Neck: Normal range of motion. Neck supple.  Cardiovascular: Normal rate, regular rhythm and normal heart sounds.   Pulmonary/Chest: He is in respiratory distress.  Not moving air well  Abdominal: Soft. Bowel sounds are normal.  Musculoskeletal: Normal range of motion.  Neurological: He is alert and oriented to person, place, and time.  Skin:  Diaphoretic, peripheral edema  Psychiatric: He has a normal mood and affect. His behavior is normal.    ED Course  INTUBATION Date/Time: 07/23/2013 10:09 AM Performed by: Nat Christen Authorized by: Nat Christen Comments: Patient intubated at approximately 1010 with a 7.0 endotracheal tube using the RSI technique with etomidate 20 mg and succinylcholine 100 mg.  Patient intubated without difficulty. CO2 monitor showed appropriate color change. Frothy pink sputum noted in the ED tube. Breath sounds verified by physician assistant. Chest x-ray obtained. Chest x-ray confirms tube placement.   (including critical care time)  DIAGNOSTIC STUDIES: Oxygen Saturation is 71% on BIPAP, low by my interpretation.    COORDINATION OF CARE:  9:36 AM- Discussed treatment plan with patient, which includes  intubation, and the patient agreed to the plan.   9:39 AM- Chest compressions begun.  9:40 AM- Intubation procedure.   9:41 AM- Compressions resumed.   9:42 AM- Nitro drip, Dopamine drip, and lasix ordered.   9:44 AM- Portable chest x-ray ordered. Lab work ordered.   9:45 AM- Vitals measured at 99% oxygen and 100 pulse.   9:48 AM- Confirmed placement of tube via imaging.   Labs Review Labs Reviewed  CULTURE, BLOOD (ROUTINE X 2)  CULTURE, BLOOD (ROUTINE X 2)  CBC WITH DIFFERENTIAL  COMPREHENSIVE METABOLIC PANEL  TROPONIN I  URINALYSIS, ROUTINE W REFLEX MICROSCOPIC  LACTIC ACID, PLASMA    Imaging Review No results found.  EKG Interpretation    Date/Time:  Sunday July 23 2013 09:58:42 EST Ventricular Rate:  98 PR Interval:  230 QRS Duration: 112 QT Interval:  398 QTC Calculation: 508 R Axis:   102 Text Interpretation:  Sinus rhythm with 1st degree A-V block with Premature supraventricular complexes and with occasional Premature ventricular complexes Rightward axis Inferior infarct , age undetermined ST \\T \ T wave abnormality, consider lateral ischemia Prolonged QT Abnormal ECG When compared with ECG of 29-May-2013 08:15, Significant changes have occurred Confirmed by Pruitt Taboada  MD, Salahuddin Arismendez ([id number]) on 07/23/2013 10:12:10 AM            MDM   1. CHF (congestive heart failure)    Patient in severe respiratory distress.   Patient intubated emergently with  7.0 endotracheal tube using RSI technique.   Working diagnosis was respiratory failure, congestive heart failure, COPD, CAD.   Intravenous Lasix, intravenous nitroglycerin drip initiated. Foley catheter inserted. Vital signs began to normalize. Blood pressure and pulse reduced. Oxygenation improved. Dopamine ordered. Blood cultures obtained. Discussed with Dr. Tia Masker at [facility]. Will transfer.  CRITICAL CARE Performed by: Nat Christen Total critical care time: 60 Critical care time was exclusive of separately  billable procedures and treating other patients. Critical care was necessary to treat or prevent imminent or life-threatening deterioration. Critical care was time spent personally by me on the following activities: development of treatment plan with patient and/or surrogate as well as nursing, discussions with consultants, evaluation of patient's response to treatment, examination of patient, obtaining history from patient or surrogate, ordering and performing treatments and interventions, ordering and review of laboratory studies, ordering and review of radiographic studies, pulse oximetry and re-evaluation of patient's condition.    I personally performed the services described in this documentation, which was scribed in my presence. The recorded information has been reviewed and is accurate.     Nat Christen, MD 07/23/13 [place], MD 07/25/13 2204

## 2013-07-23 NOTE — ED Notes (Signed)
Called to give report. Asked to call back in 15 mins when nurse arrives to floor. Informed staff pt was ready to leave.

## 2013-07-23 NOTE — ED Notes (Signed)
Pt arrived via ems for respiratory distress. Arrived on CPAP with sats low 70s. Resp paged and MD at [facility]. Decreased breath sounds throughout. Pt anxious upon arrival. Pt was given albuterol and solumedrol en route by EMS workers. Dr. Lacinda Axon obtained verbal consent from pt to intubate d/t breathing difficulty. Pt was placed in position for intubation and hr started to decrease along with color changing. Resp began to decrease as well and pulse become un palpable. Per Dr. Lacinda Axon who verified no pulse CPR began at 0940 Pt intubated and pulse was obtained after intubation was completed.

## 2013-07-23 NOTE — Progress Notes (Signed)
ANTICOAGULATION CONSULT NOTE - Initial Consult  Pharmacy Consult for Coumadin Indication: Afib, AVR, MVR  No Known Allergies  Patient Measurements: Weight: 160 lb (72.576 kg) Heparin Dosing Weight: n/a  Vital Signs: Temp: 95.8 F (35.4 C) (01/18 1129) Temp src: Rectal (01/18 1129) BP: 126/50 mmHg (01/18 1100) Pulse Rate: 62 (01/18 1100)  Labs:  Recent Labs  07/23/13 1008 07/23/13 1010  HGB 12.3*  --   HCT 35.8*  --   PLT 325  --   LABPROT  --  34.1*  INR  --  3.54*  CREATININE 1.16  --   TROPONINI <0.30  --     The CrCl is unknown because both a height and weight (above a minimum accepted value) are required for this calculation.   Medical History: Past Medical History  Diagnosis Date  . Hypertension   . CAD (coronary artery disease) 2003    a. CABG/MVR/AVR-2003 (#25 St. Jude/#21 St. Jude); anticoagulation; b. negative stress nuclear-2005;  c. 01/2013 NSTEMI/Cath/PCI: LM nl, LAD 50p, 40-91m, D1 50ost, LCX 23m, RCA 50/14m (2.5x20 Promus DES), 50d, VG->Diag 100, VG->PDA 100, VG->OM 100, LIMA->LAD ok.  . Epistaxis     requiring cautery & aterial ligation-09/2009  . GERD (gastroesophageal reflux disease)   . Hyperlipemia   . Abnormal LFTs     possible cirrhosis  . Chronic anticoagulation   . Valvular heart disease     a. 2003: MVR/AVR-2003 (#25 St. Jude/#21 St. Jude);  b. 01/2013 Echo: EF 55%, Mech AVR mean grad 12, Mech MVR mean grad 6.  . Tobacco abuse     45 pack years  . Fasting hyperglycemia   . Nephrolithiasis   . Diverticulosis   . Hyponatremia   . Chronic diastolic CHF (congestive heart failure)   . Chronic renal disease   . Hemolytic anemia     history of  . ETOH abuse   . A-fib 01/16/2013    a. on coumadin  . COPD (chronic obstructive pulmonary disease)     CONTROLED    Medications:  Scheduled:  . albuterol  2.5 mg Nebulization Q6H  . aspirin  325 mg Per Tube Daily  . DOPamine  4 mcg/kg/min Intravenous Once  . furosemide      . ipratropium   0.5 mg Nebulization Q6H  . lidocaine (cardiac) 100 mg/80ml      . nitroGLYCERIN      . pantoprazole (PROTONIX) IV  40 mg Intravenous QHS  . rocuronium        Assessment: 68 yo male on chronic Coumadin for afib, MVR, AVR admitted with severe respiratory distress, brief cardiac arrest.  Pharmacy asked to continue Coumadin.  Goal INR 2.5-3.5.  PTA Coumadin dose 7.5 mg MWF, and 5 mg all other days.  Wife thinks last dose taken at home last night.  Today's INR just above goal, no bleeding noted.  Goal of Therapy:  INR 2.5-3.5 Monitor platelets by anticoagulation protocol: Yes   Plan:  1. No Coumadin tonight since slightly supratherapeutic. 2. Daily INR.  Uvaldo Rising, BCPS  Clinical Pharmacist Pager [phone]  07/23/2013 1:16 PM

## 2013-07-23 NOTE — ED Notes (Signed)
Color improved. Urine output recorded.

## 2013-07-23 NOTE — Procedures (Signed)
Central Venous Catheter Insertion Procedure Note MORY HERRMAN [id number] 1945-09-20  Procedure: Insertion of Central Venous Catheter Indications: Assessment of intravascular volume and Drug and/or fluid administration  Procedure Details Consent: Risks of procedure as well as the alternatives and risks of each were explained to the (patient/caregiver).  Consent for procedure obtained. Time Out: Verified patient identification, verified procedure, site/side was marked, verified correct patient position, special equipment/implants available, medications/allergies/relevent history reviewed, required imaging and test results available.  Performed  Maximum sterile technique was used including antiseptics, cap, gloves, gown, hand hygiene, mask and sheet. Skin prep: Chlorhexidine; local anesthetic administered A antimicrobial bonded/coated triple lumen catheter was placed in the left internal jugular vein using the Seldinger technique.  Evaluation Blood flow good Complications: No apparent complications Patient did tolerate procedure well. Chest X-ray ordered to verify placement.  CXR: pending.  Performed using ultrasound guidance under direct MD supervision.   Darlina Sicilian, NP 07/23/2013, 3:02 PM  I was present for this procedure. Mariel Sleet Beeper  [phone]  Cell  [phone]  If no response or cell goes to voicemail, call beeper [phone]

## 2013-07-23 NOTE — ED Notes (Signed)
Warm blankets placed.

## 2013-07-23 NOTE — H&P (Signed)
Name: [name] MRN: [id number] DOB: Jun 26, 1946    ADMISSION DATE:  07/23/2013  REFERRING MD :  Forestine Na EDP  PRIMARY SERVICE: PCCM  CHIEF COMPLAINT:  Respiratory failure   BRIEF PATIENT DESCRIPTION: 68 yo male with hx COPD, CHF, CAD, St Jude valve replacement on coumadin presented 1/18 to [facility] ER with severe respiratory distress requiring intubation.  Brief cardiac arrest prior to intubation. Tx to [facility].    SIGNIFICANT EVENTS / STUDIES:  1/18 Pt presented to [facility] hypoxic and intubated with brief arrest during intubation. ProBNP elevated and fluid overloaded.   LINES / TUBES: ETT 1/18>>> OGT 1/18 >>> Foley 1/18 >>>  CULTURES: BC x 2 1/18>>> Urine 1/18>>> Sputum 1/18>>>  ANTIBIOTICS: None  HISTORY OF PRESENT ILLNESS:  68yo male with hx COPD, CHF, Afib on coumadin.  Recent hospitalization for decompensated CHF.  Cont to have fluid retention as outpt and recent up titration of lasix and torsemide at last cardiology outpt visit.  Presented 1/18 to [facility] ER with severe SOB, BLE edema.  Had brief cardiac arrest when laid flat for intubation with brief CPR, return of pulse spontaneously with successful intubation. Phone call 3 days prior indicating patient was out of his torsemide. Hypoxic at [facility] ED.    PAST MEDICAL HISTORY :  Past Medical History  Diagnosis Date  . Hypertension   . CAD (coronary artery disease) 2003    a. CABG/MVR/AVR-2003 (#25 St. Jude/#21 St. Jude); anticoagulation; b. negative stress nuclear-2005;  c. 01/2013 NSTEMI/Cath/PCI: LM nl, LAD 50p, 40-37m, D1 50ost, LCX 94m, RCA 50/96m (2.5x20 Promus DES), 50d, VG->Diag 100, VG->PDA 100, VG->OM 100, LIMA->LAD ok.  . Epistaxis     requiring cautery & aterial ligation-09/2009  . GERD (gastroesophageal reflux disease)   . Hyperlipemia   . Abnormal LFTs     possible cirrhosis  . Chronic anticoagulation   . Valvular heart disease     a. 2003: MVR/AVR-2003 (#25 St. Jude/#21 St. Jude);  b. 01/2013  Echo: EF 55%, Mech AVR mean grad 12, Mech MVR mean grad 6.  . Tobacco abuse     45 pack years  . Fasting hyperglycemia   . Nephrolithiasis   . Diverticulosis   . Hyponatremia   . Chronic diastolic CHF (congestive heart failure)   . Chronic renal disease   . Hemolytic anemia     history of  . ETOH abuse   . A-fib 01/16/2013    a. on coumadin  . COPD (chronic obstructive pulmonary disease)     CONTROLED   Past Surgical History  Procedure Laterality Date  . Endocopic sphenopalatine artery ligation & cautry    . Inguinal hernia repair      Left & right  . Coronary artery bypass graft  11/2001    [facility]  . Cardiac valve replacement  11/2001    AVR and MVR-St. Jude devices  . Cataract extraction w/phaco  01/20/2011    Procedure: CATARACT EXTRACTION PHACO AND INTRAOCULAR LENS PLACEMENT (IOC);  Surgeon: Elta Guadeloupe T. Gershon Crane;  Location: [facility] ORS;  Service: Ophthalmology;  Laterality: Right;  CDE: 8.51  . Cataract extraction w/phaco  02/03/2011    Procedure: CATARACT EXTRACTION PHACO AND INTRAOCULAR LENS PLACEMENT (IOC);  Surgeon: Elta Guadeloupe T. Gershon Crane;  Location: [facility] ORS;  Service: Ophthalmology;  Laterality: Left;  CDE:10.01  . Colonoscopy  04/05/02; 08/2011    friable anal canal hemorrhoids otherwise normal; 2 diminutive polyps excised, minimal diverticulosis noted  . Esophagogastroduodenoscopy  01/2002  Dr. Laural Golden, submucosal esophageal lesion c/w leiomyoma  . Colonoscopy  09/02/2011    Procedure: COLONOSCOPY;  Surgeon: Daneil Dolin, MD;  Location: [facility] ENDO SUITE;  Service: Endoscopy;  Laterality: N/A;  8:15  . Yag laser application Right [id number]    Procedure: YAG LASER APPLICATION;  Surgeon: Elta Guadeloupe T. Gershon Crane, MD;  Location: [facility] ORS;  Service: Ophthalmology;  Laterality: Right;   Prior to Admission medications   Medication Sig Start Date End Date Taking? Authorizing Provider  acetaminophen (TYLENOL) 325 MG tablet Take 2 tablets (650 mg total) by mouth every 6 (six) hours as needed. For  pain 01/22/13   Brooke O Edmisten, PA-C  amLODipine (NORVASC) 10 MG tablet TAKE ONE TABLET BY MOUTH AT BEDTIME 06/24/13   Lendon Colonel, NP  atorvastatin (LIPITOR) 40 MG tablet Take 40 mg by mouth daily.    Historical Provider, MD  clopidogrel (PLAVIX) 75 MG tablet Take 1 tablet (75 mg total) by mouth daily with breakfast. 07/03/13   Satira Sark, MD  ferrous sulfate 325 (65 FE) MG EC tablet Take 325 mg by mouth daily with breakfast.    Historical Provider, MD  folic acid (FOLVITE) 1 MG tablet Take 1 tablet (1 mg total) by mouth daily. 01/22/13   Brooke O Edmisten, PA-C  HYDROcodone-acetaminophen (LORTAB) 10-500 MG per tablet Take 1 tablet by mouth 4 times daily as needed for pain.  03/07/12   Historical Provider, MD  KLOR-CON M20 20 MEQ tablet  06/16/13   Historical Provider, MD  levalbuterol Penne Lash) 0.63 MG/3ML nebulizer solution Take 3 mLs (0.63 mg total) by nebulization every 6 (six) hours as needed for wheezing or shortness of breath. 01/22/13   Azzie Roup Edmisten, PA-C  magnesium oxide (MAG-OX) 400 (241.3 MG) MG tablet Take 1 tablet (400 mg total) by mouth daily. 01/31/13   Rogelia Mire, NP  metoprolol (LOPRESSOR) 100 MG tablet Take 1 tablet (100 mg total) by mouth 2 (two) times daily. 07/03/13   Satira Sark, MD  Multiple Vitamin (MULTIVITAMIN WITH MINERALS) TABS Take 1 tablet by mouth daily. 01/22/13   Brooke O Edmisten, PA-C  nitroGLYCERIN (NITROSTAT) 0.4 MG SL tablet Place 1 tablet (0.4 mg total) under the tongue every 5 (five) minutes x 3 doses as needed for chest pain. 01/31/13   Rogelia Mire, NP  pantoprazole (PROTONIX) 40 MG tablet Take 1 tablet (40 mg total) by mouth daily. 01/31/13   Rogelia Mire, NP  thiamine 100 MG tablet Take 1 tablet (100 mg total) by mouth daily. 01/22/13   Brooke O Edmisten, PA-C  torsemide (DEMADEX) 20 MG tablet Take 2 tablets (40 mg total) by mouth 2 (two) times daily. 07/20/13   Satira Sark, MD  warfarin (COUMADIN) 5 MG tablet  Take 5-7.5 mg by mouth See admin instructions. Take 1.5 tab (7.5 mg) monday Wednesday Friday, and 5 mg on Tuesday, Thursday, Saturday Sunday    Historical Provider, MD   No Known Allergies  FAMILY HISTORY:  Family History  Problem Relation Age of Onset  . Hypotension Neg Hx   . Anesthesia problems Neg Hx   . Malignant hyperthermia Neg Hx   . Pseudochol deficiency Neg Hx   . Colon cancer Neg Hx   . Liver disease Neg Hx    SOCIAL HISTORY:  reports that he has been smoking Cigarettes.  He has a 13 pack-year smoking history. He has never used smokeless tobacco. He reports that he drinks about 4.2 ounces of alcohol per week.  He reports that he does not use illicit drugs.  REVIEW OF SYSTEMS:  Unable. Pt sedated on vent.  See HPI.   VITAL SIGNS: Temp:  [95.8 F (35.4 C)-96.2 F (35.7 C)] 95.8 F (35.4 C) (01/18 1129) Pulse Rate:  [62-72] 62 (01/18 1100) Resp:  [16-28] 16 (01/18 1100) BP: (110-179)/(47-78) 126/50 mmHg (01/18 1100) SpO2:  [71 %-99 %] 97 % (01/18 1100) FiO2 (%):  [100 %] 100 % (01/18 1045) Weight:  [160 lb (72.576 kg)] 160 lb (72.576 kg) (01/18 1027) HEMODYNAMICS:   VENTILATOR SETTINGS: Vent Mode:  [-] PRVC FiO2 (%):  [100 %] 100 % Set Rate:  [12 bmp] 12 bmp Vt Set:  [500 mL] 500 mL PEEP:  [5 cmH20] 5 cmH20 Plateau Pressure:  [18 cmH20] 18 cmH20 INTAKE / OUTPUT: Intake/Output     01/17 0701 - 01/18 0700 01/18 0701 - 01/19 0700   NG/GT  60   Total Intake(mL/kg)  60 (0.8)   Urine (mL/kg/hr)  150   Total Output   150   Net   -90          PHYSICAL EXAMINATION: General:  Intubated and sedated Neuro:  Opens eyes to name and follows basic commands HEENT:  PERRLA Cardiovascular:  S1 S2 normal, HV click noted, regular rate Lungs:  Wheezing diffusely with some rales at the bases Abdomen:  +BS, non-tender, non-distended Musculoskeletal:  Moves all 4 extremities Skin:  Some redness around rectum stage I versus moisture associated skin  changes  LABS:  CBC  Recent Labs Lab 07/23/13 1008  WBC 7.8  HGB 12.3*  HCT 35.8*  PLT 325   Coag's  Recent Labs Lab 07/23/13 1010  INR 3.54*   BMET  Recent Labs Lab 07/23/13 1008  NA 135*  K 4.4  CL 95*  CO2 20  BUN 30*  CREATININE 1.16  GLUCOSE 106*   Electrolytes  Recent Labs Lab 07/23/13 1008  CALCIUM 8.7   Sepsis Markers  Recent Labs Lab 07/23/13 1009  LATICACIDVEN 5.2*   ABG  Recent Labs Lab 07/23/13 1120  PHART 7.197*  PCO2ART 62.0*  PO2ART 80.4   Liver Enzymes  Recent Labs Lab 07/23/13 1008  AST 52*  ALT 30  ALKPHOS 142*  BILITOT 0.9  ALBUMIN 3.6   Cardiac Enzymes  Recent Labs Lab 07/23/13 1008  TROPONINI <0.30  PROBNP 8081.0*   Glucose  Recent Labs Lab 07/23/13 1240  GLUCAP 176*    Imaging Dg Chest Portable 1 View  07/23/2013   CLINICAL DATA:  Endotracheal tube placement  EXAM: PORTABLE CHEST - 1 VIEW  COMPARISON:  05/29/2013  FINDINGS: Moderate cardiac enlargement. Status post CABG and replacement cardiac valve placement. Endotracheal tube is seen with tip 3.9 cm above the carina. There is vascular congestion in. There is moderate to severe interstitial prominence with more confluent alveolar opacities in the mid lung zones, right worse than left.  IMPRESSION: Endotracheal tube as described. Congestive heart failure with moderate to severe pulmonary edema.   Electronically Signed   By: Skipper Cliche M.D.   On: 07/23/2013 10:10   CXR: severe pulmonary edema, right more than left, ETT good position  ASSESSMENT / PLAN:  PULMONARY Acute respiratory failure - r/t decompensated CHF with underlying COPD  COPD Pulmonary Edema   P:   Vent support  Diuresis as below  CXR in AM  F/u ABG  Daily SBT  Albuterol/Atrovent nebs q 6 h VAP Px  CARDIOVASCULAR Acute on chronic diastolic CHF -- EF 37% (  01/18/2013), dry weight unclear but lowest recently is 155#. Today 160#. CAD - s/p CABG/MVR/AVR  Chronic anticoagulation   P:  2D echo when out of acute failure  Diuresis (Lasix 120 mg in ED, if no diuresis can escalate dosing) Dopamine drip for BP control Cardiology consult Trend troponin Hold home norvasc, metoprolol Coumadin per pharmacy goal 2.5-3.5   RENAL Hyponatremia -chronic  P:   F/u chem   GASTROINTESTINAL A:  GI Px Nutrition P:   Protonix TF   HEMATOLOGIC A:  Cardiogenic shock - EF low with fluid overload, needs diuresis Hypothermia - Bear hugger and likely due to poor perfusion P:  Repeat lasix 80 mg tomorrow if dose achieves diuresis, if not escalate. Dopamine gtt for improved perfusion. Bair hugger   INFECTIOUS A:  No active, U/A unremarkable, blood and respiratory cultures pending. P:   Monitor off Abx, follow cultures  ENDOCRINE A:  No active P:   Check cortisol Check CBG  NEUROLOGIC A:  Acute encephalopathy - Likely from hypoxia. Follows basic commands on arrival. P:   D/C propofol for bradycardia Precedex gtt Fentanyl prn Versed prn Daily WUA  I have personally obtained a history, examined the patient, evaluated laboratory and imaging results, formulated the assessment and plan and placed orders. CRITICAL CARE: The patient is critically ill with multiple organ systems failure and requires high complexity decision making for assessment and support, frequent evaluation and titration of therapies, application of advanced monitoring technologies and extensive interpretation of multiple databases. Critical Care Time devoted to patient care services described in this note is __60_ minutes.    Vertell Novak, PGY-3 07/23/2013  1:05 PM   I have seen and discussed this pt with Dr Doug Sou.  I agree with the above assessment with edits.  Plan is to diurese more aggressively, cont full vent support, no ABX indicated but f/u c/s data.  See orders.  Mariel Sleet Beeper  [phone]  Cell  [phone]  If no response or cell goes to voicemail, call beeper  [phone]

## 2013-07-23 NOTE — ED Notes (Signed)
Report given to [facility] with [place]. [facility] here for transport

## 2013-07-23 NOTE — Progress Notes (Signed)
Pt. arrived from [facility] hospital.

## 2013-07-23 NOTE — ED Notes (Signed)
Orders to stop nitro

## 2013-07-24 ENCOUNTER — Inpatient Hospital Stay (HOSPITAL_COMMUNITY): Payer: Medicare Other

## 2013-07-24 DIAGNOSIS — I4891 Unspecified atrial fibrillation: Secondary | ICD-10-CM

## 2013-07-24 DIAGNOSIS — I509 Heart failure, unspecified: Secondary | ICD-10-CM

## 2013-07-24 DIAGNOSIS — I214 Non-ST elevation (NSTEMI) myocardial infarction: Secondary | ICD-10-CM

## 2013-07-24 DIAGNOSIS — J984 Other disorders of lung: Secondary | ICD-10-CM

## 2013-07-24 DIAGNOSIS — I379 Nonrheumatic pulmonary valve disorder, unspecified: Secondary | ICD-10-CM

## 2013-07-24 LAB — LACTIC ACID, PLASMA: LACTIC ACID, VENOUS: 1.5 mmol/L (ref 0.5–2.2)

## 2013-07-24 LAB — CBC
HCT: 30.1 % — ABNORMAL LOW (ref 39.0–52.0)
Hemoglobin: 10.3 g/dL — ABNORMAL LOW (ref 13.0–17.0)
MCH: 33.4 pg (ref 26.0–34.0)
MCHC: 34.2 g/dL (ref 30.0–36.0)
MCV: 97.7 fL (ref 78.0–100.0)
Platelets: 218 10*3/uL (ref 150–400)
RBC: 3.08 MIL/uL — AB (ref 4.22–5.81)
RDW: 14.1 % (ref 11.5–15.5)
WBC: 12.7 10*3/uL — ABNORMAL HIGH (ref 4.0–10.5)

## 2013-07-24 LAB — COMPREHENSIVE METABOLIC PANEL
ALK PHOS: 103 U/L (ref 39–117)
ALT: 26 U/L (ref 0–53)
AST: 52 U/L — ABNORMAL HIGH (ref 0–37)
Albumin: 2.9 g/dL — ABNORMAL LOW (ref 3.5–5.2)
BILIRUBIN TOTAL: 0.5 mg/dL (ref 0.3–1.2)
BUN: 32 mg/dL — AB (ref 6–23)
CO2: 25 mEq/L (ref 19–32)
CREATININE: 1.21 mg/dL (ref 0.50–1.35)
Calcium: 8.5 mg/dL (ref 8.4–10.5)
Chloride: 95 mEq/L — ABNORMAL LOW (ref 96–112)
GFR, EST AFRICAN AMERICAN: 69 mL/min — AB (ref >90)
GFR, EST NON AFRICAN AMERICAN: 60 mL/min — AB (ref >90)
GLUCOSE: 162 mg/dL — AB (ref 70–99)
Potassium: 4.5 mEq/L (ref 3.7–5.3)
Sodium: 135 mEq/L — ABNORMAL LOW (ref 137–147)
Total Protein: 6 g/dL (ref 6.0–8.3)

## 2013-07-24 LAB — GLUCOSE, CAPILLARY
GLUCOSE-CAPILLARY: 136 mg/dL — AB (ref 70–99)
Glucose-Capillary: 131 mg/dL — ABNORMAL HIGH (ref 70–99)
Glucose-Capillary: 132 mg/dL — ABNORMAL HIGH (ref 70–99)
Glucose-Capillary: 148 mg/dL — ABNORMAL HIGH (ref 70–99)
Glucose-Capillary: 150 mg/dL — ABNORMAL HIGH (ref 70–99)
Glucose-Capillary: 159 mg/dL — ABNORMAL HIGH (ref 70–99)

## 2013-07-24 LAB — TROPONIN I
TROPONIN I: 3.22 ng/mL — AB (ref <0.30)
Troponin I: 3.06 ng/mL (ref <0.30)

## 2013-07-24 LAB — URINE CULTURE
COLONY COUNT: NO GROWTH
CULTURE: NO GROWTH
SPECIAL REQUESTS: NORMAL

## 2013-07-24 LAB — PROTIME-INR
INR: 4.69 — AB (ref 0.00–1.49)
Prothrombin Time: 42.3 seconds — ABNORMAL HIGH (ref 11.6–15.2)

## 2013-07-24 LAB — CORTISOL: Cortisol, Plasma: 42.7 ug/dL

## 2013-07-24 LAB — MAGNESIUM: MAGNESIUM: 1.8 mg/dL (ref 1.5–2.5)

## 2013-07-24 MED ORDER — VITAL AF 1.2 CAL PO LIQD
1000.0000 mL | ORAL | Status: DC
  Administered 2013-07-24: 1000 mL
  Filled 2013-07-24 (×4): qty 1000

## 2013-07-24 MED ORDER — METOPROLOL TARTRATE 12.5 MG HALF TABLET
12.5000 mg | ORAL_TABLET | Freq: Two times a day (BID) | ORAL | Status: DC
  Administered 2013-07-24: 12.5 mg via ORAL
  Filled 2013-07-24: qty 1

## 2013-07-24 MED ORDER — FUROSEMIDE 10 MG/ML IJ SOLN
120.0000 mg | Freq: Once | INTRAVENOUS | Status: AC
  Administered 2013-07-24: 120 mg via INTRAVENOUS
  Filled 2013-07-24: qty 12

## 2013-07-24 MED ORDER — ACETAMINOPHEN 325 MG PO TABS
650.0000 mg | ORAL_TABLET | Freq: Once | ORAL | Status: AC
  Administered 2013-07-24: 650 mg via NASOGASTRIC
  Filled 2013-07-24: qty 2

## 2013-07-24 MED ORDER — HYDRALAZINE HCL 20 MG/ML IJ SOLN
5.0000 mg | Freq: Four times a day (QID) | INTRAMUSCULAR | Status: DC | PRN
  Administered 2013-07-25: 5 mg via INTRAVENOUS
  Filled 2013-07-24 (×2): qty 1

## 2013-07-24 MED ORDER — METOPROLOL TARTRATE 25 MG PO TABS
25.0000 mg | ORAL_TABLET | Freq: Two times a day (BID) | ORAL | Status: DC
  Filled 2013-07-24 (×2): qty 1

## 2013-07-24 NOTE — Progress Notes (Signed)
INITIAL NUTRITION ASSESSMENT  DOCUMENTATION CODES Per approved criteria  -Not Applicable   INTERVENTION:  Recommend initiate TF via OGT with Vital AF 1.2 at 20 ml/h, increase by 10 ml every 4 hours to goal rate of 60 ml/h to provide 1728 kcals, 108 gm protein, 1168 ml free water daily.  NUTRITION DIAGNOSIS: Inadequate oral intake related to inability to eat as evidenced by NPO status.   Goal: Intake to meet >90% of estimated nutrition needs.  Monitor:  TF tolerance/adequacy, weight trend, labs, vent status.  Reason for Assessment: VDRF; New TF  68 y.o. male  Admitting Dx: Acute respiratory failure with hypoxia  ASSESSMENT: 68 yo male with hx COPD, CHF, CAD, St Jude valve replacement on coumadin presented 1/18 to [facility] ER with severe respiratory distress requiring intubation. Brief cardiac arrest prior to intubation. Tx to [facility].   Nutrition Focused Physical Exam:  Subcutaneous Fat:  Orbital Region: WNL Upper Arm Region: WNL Thoracic and Lumbar Region: NA  Muscle:  Temple Region: mild depletion Clavicle Bone Region: WNL Clavicle and Acromion Bone Region: WNL Scapular Bone Region: NA Dorsal Hand: NA Patellar Region: mild depletion Anterior Thigh Region: mild depletion Posterior Calf Region: mild depletion  Edema: not present  Patient with 10% weight loss in the past 2 months, unsure if related to fluid status with history of heart failure. Noted patient was fluid overloaded on admission.   Vital AF 1.2 at 40 ml/h has been ordered, not initiated yet.  Patient is currently intubated on ventilator support.  MV: 10.7 L/min Temp (24hrs), Avg:98.2 F (36.8 C), Min:95.8 F (35.4 C), Max:100 F (37.8 C)   Height: Ht Readings from Last 1 Encounters:  07/23/13 5\' 7"  (1.702 m)    Weight: Wt Readings from Last 1 Encounters:  07/24/13 144 lb 10 oz (65.6 kg)    Ideal Body Weight: 67.3 kg  % Ideal Body Weight: 97%  Wt Readings from Last 10 Encounters:   07/24/13 144 lb 10 oz (65.6 kg)  06/01/13 160 lb 8 oz (72.802 kg)  03/30/13 157 lb 12 oz (71.555 kg)  02/10/13 155 lb (70.308 kg)  01/31/13 147 lb (66.679 kg)  01/26/13 160 lb 8 oz (72.802 kg)  01/22/13 147 lb 14.4 oz (67.087 kg)  01/22/13 147 lb 14.4 oz (67.087 kg)  03/22/12 142 lb (64.411 kg)  09/02/11 164 lb (74.39 kg)    Usual Body Weight: 160 lb (2 months ago)  % Usual Body Weight: 90%  BMI:  Body mass index is 22.65 kg/(m^2).  Estimated Nutritional Needs: Kcal: 5329 Protein: 90-100 gm Fluid: 1.7-1.9 L  Skin: no wounds  Diet Order: NPO  EDUCATION NEEDS: -Education not appropriate at this time   Intake/Output Summary (Last 24 hours) at 07/24/13 1530 Last data filed at 07/24/13 1200  Gross per 24 hour  Intake 765.79 ml  Output   3190 ml  Net -2424.21 ml    Last BM: none documented   Labs:   Recent Labs Lab 07/23/13 1008 07/24/13 0300  NA 135* 135*  K 4.4 4.5  CL 95* 95*  CO2 20 25  BUN 30* 32*  CREATININE 1.16 1.21  CALCIUM 8.7 8.5  MG  --  1.8  GLUCOSE 106* 162*    CBG (last 3)   Recent Labs  07/24/13 0334 07/24/13 0740 07/24/13 1151  GLUCAP 159* 131* 132*    Scheduled Meds: . antiseptic oral rinse  15 mL Mouth Rinse QID  . aspirin  325 mg Per Tube Daily  .  chlorhexidine  15 mL Mouth Rinse BID  . ipratropium-albuterol  3 mL Nebulization Q6H  . pantoprazole (PROTONIX) IV  40 mg Intravenous QHS  . Warfarin - Pharmacist Dosing Inpatient   Does not apply q1800    Continuous Infusions: . dexmedetomidine 0.5 mcg/kg/hr (07/24/13 1526)  . feeding supplement (VITAL AF 1.2 CAL)      Past Medical History  Diagnosis Date  . Hypertension   . CAD (coronary artery disease) 2003    a. CABG/MVR/AVR-2003 (#25 St. Jude/#21 St. Jude); anticoagulation; b. negative stress nuclear-2005;  c. 01/2013 NSTEMI/Cath/PCI: LM nl, LAD 50p, 40-19m, D1 50ost, LCX 54m, RCA 50/8m (2.5x20 Promus DES), 50d, VG->Diag 100, VG->PDA 100, VG->OM 100, LIMA->LAD ok.   . Epistaxis     requiring cautery & aterial ligation-09/2009  . GERD (gastroesophageal reflux disease)   . Hyperlipemia   . Abnormal LFTs     possible cirrhosis  . Chronic anticoagulation   . Valvular heart disease     a. 2003: MVR/AVR-2003 (#25 St. Jude/#21 St. Jude);  b. 01/2013 Echo: EF 55%, Mech AVR mean grad 12, Mech MVR mean grad 6.  . Tobacco abuse     45 pack years  . Fasting hyperglycemia   . Nephrolithiasis   . Diverticulosis   . Hyponatremia   . Chronic diastolic CHF (congestive heart failure)   . Chronic renal disease   . Hemolytic anemia     history of  . ETOH abuse   . A-fib 01/16/2013    a. on coumadin  . COPD (chronic obstructive pulmonary disease)     CONTROLED    Past Surgical History  Procedure Laterality Date  . Endocopic sphenopalatine artery ligation & cautry    . Inguinal hernia repair      Left & right  . Coronary artery bypass graft  11/2001    [facility]  . Cardiac valve replacement  11/2001    AVR and MVR-St. Jude devices  . Cataract extraction w/phaco  01/20/2011    Procedure: CATARACT EXTRACTION PHACO AND INTRAOCULAR LENS PLACEMENT (IOC);  Surgeon: Elta Guadeloupe T. Gershon Crane;  Location: [facility] ORS;  Service: Ophthalmology;  Laterality: Right;  CDE: 8.51  . Cataract extraction w/phaco  02/03/2011    Procedure: CATARACT EXTRACTION PHACO AND INTRAOCULAR LENS PLACEMENT (IOC);  Surgeon: Elta Guadeloupe T. Gershon Crane;  Location: [facility] ORS;  Service: Ophthalmology;  Laterality: Left;  CDE:10.01  . Colonoscopy  04/05/02; 08/2011    friable anal canal hemorrhoids otherwise normal; 2 diminutive polyps excised, minimal diverticulosis noted  . Esophagogastroduodenoscopy  01/2002    Dr. Laural Golden, submucosal esophageal lesion c/w leiomyoma  . Colonoscopy  09/02/2011    Procedure: COLONOSCOPY;  Surgeon: Daneil Dolin, MD;  Location: [facility] ENDO SUITE;  Service: Endoscopy;  Laterality: N/A;  8:15  . Yag laser application Right [id number]/[id number]    Procedure: YAG LASER APPLICATION;  Surgeon: Elta Guadeloupe T.  Gershon Crane, MD;  Location: [facility] ORS;  Service: Ophthalmology;  Laterality: Right;     Molli Barrows, RD, LDN, [place] Pager [phone] After Hours Pager [phone]

## 2013-07-24 NOTE — Consult Note (Signed)
CARDIOLOGY CONSULT NOTE  Patient ID: [name]  MRN: [id number]  DOB: 1945-08-20  Admit date: 07/23/2013 Date of Consult: 07/24/2013  Primary Physician: Alonza Bogus, MD Primary Cardiologist: Dr. Lattie Haw  Reason for Consultation: NSTEMI management post respiratory arrest.  History of Present Illness: [name] is a 68 y.o. male who we are asked to see for elevated troponin following PEA arrest due to hypoxia.  The patient is a 68 year old male with a past medical history significant for coronary artery bypass graft surgery (3 occluded vein grafts and patent LIMA to LAD), relatively recent percutaneous coronary intervention of the mid right coronary artery with a drug-eluting stent in 123456, chronic diastolic heart failure, mechanical aortic and mitral valve replacements, atrial fibrillation, nonsustained ventricular tachycardia, supraventricular tachycardia, hyperlipidemia and hypertension, hypomagnesemia, as well as severe COPD.  In mid-July, he was admitted to [facility] with a NSTEMI and underwent DES to the mid RCA. Later that month, he was readmitted with acute on chronic diastolic heart failure. He was noted to be noncompliant with a low-sodium diet, and also had ongoing problems with both alcohol and tobacco abuse.  His most recent echocardiogram is from 01-18-13 which revealed normal left ventricular systolic function, indeterminate diastolic function, and normally functioning mechanical mitral and aortic valves.  His Lasix was recently stopped per chart review.  He presented yesterday to [facility] with worsening dyspnea.  Was found to be hypoxic felt secondary to pulmonary edema requiring intubation.  During intubation he became hypotensive, brief PEA. Dopamine started, now off.   Patient is intubated/sedated, unable to provide history.    Past Medical History  Diagnosis Date  . Hypertension   . CAD (coronary artery disease) 2003    a. CABG/MVR/AVR-2003 (#25 St.  Jude/#21 St. Jude); anticoagulation; b. negative stress nuclear-2005;  c. 01/2013 NSTEMI/Cath/PCI: LM nl, LAD 50p, 40-74m, D1 50ost, LCX 85m, RCA 50/36m (2.5x20 Promus DES), 50d, VG->Diag 100, VG->PDA 100, VG->OM 100, LIMA->LAD ok.  . Epistaxis     requiring cautery & aterial ligation-09/2009  . GERD (gastroesophageal reflux disease)   . Hyperlipemia   . Abnormal LFTs     possible cirrhosis  . Chronic anticoagulation   . Valvular heart disease     a. 2003: MVR/AVR-2003 (#25 St. Jude/#21 St. Jude);  b. 01/2013 Echo: EF 55%, Mech AVR mean grad 12, Mech MVR mean grad 6.  . Tobacco abuse     45 pack years  . Fasting hyperglycemia   . Nephrolithiasis   . Diverticulosis   . Hyponatremia   . Chronic diastolic CHF (congestive heart failure)   . Chronic renal disease   . Hemolytic anemia     history of  . ETOH abuse   . A-fib 01/16/2013    a. on coumadin  . COPD (chronic obstructive pulmonary disease)     CONTROLED    Past Surgical History  Procedure Laterality Date  . Endocopic sphenopalatine artery ligation & cautry    . Inguinal hernia repair      Left & right  . Coronary artery bypass graft  11/2001    [facility]  . Cardiac valve replacement  11/2001    AVR and MVR-St. Jude devices  . Cataract extraction w/phaco  01/20/2011    Procedure: CATARACT EXTRACTION PHACO AND INTRAOCULAR LENS PLACEMENT (IOC);  Surgeon: Elta Guadeloupe T. Gershon Crane;  Location: [facility] ORS;  Service: Ophthalmology;  Laterality: Right;  CDE: 8.51  . Cataract extraction w/phaco  02/03/2011    Procedure: CATARACT EXTRACTION PHACO  AND INTRAOCULAR LENS PLACEMENT (IOC);  Surgeon: Elta Guadeloupe T. Gershon Crane;  Location: [facility] ORS;  Service: Ophthalmology;  Laterality: Left;  CDE:10.01  . Colonoscopy  04/05/02; 08/2011    friable anal canal hemorrhoids otherwise normal; 2 diminutive polyps excised, minimal diverticulosis noted  . Esophagogastroduodenoscopy  01/2002    Dr. Laural Golden, submucosal esophageal lesion c/w leiomyoma  . Colonoscopy   09/02/2011    Procedure: COLONOSCOPY;  Surgeon: Daneil Dolin, MD;  Location: [facility] ENDO SUITE;  Service: Endoscopy;  Laterality: N/A;  8:15  . Yag laser application Right [id number]    Procedure: YAG LASER APPLICATION;  Surgeon: Elta Guadeloupe T. Gershon Crane, MD;  Location: [facility] ORS;  Service: Ophthalmology;  Laterality: Right;     Home Meds: Prior to Admission medications   Medication Sig Start Date End Date Taking? Authorizing Provider  amLODipine (NORVASC) 10 MG tablet Take 10 mg by mouth at bedtime.   Yes Historical Provider, MD  atorvastatin (LIPITOR) 40 MG tablet Take 40 mg by mouth daily.   Yes Historical Provider, MD  clopidogrel (PLAVIX) 75 MG tablet Take 1 tablet (75 mg total) by mouth daily with breakfast. 07/03/13  Yes Satira Sark, MD  HYDROcodone-acetaminophen [facility]) 10-325 MG per tablet Take 1 tablet by mouth every 6 (six) hours as needed (pain).   Yes Historical Provider, MD  levalbuterol Penne Lash) 0.63 MG/3ML nebulizer solution Take 3 mLs (0.63 mg total) by nebulization every 6 (six) hours as needed for wheezing or shortness of breath. 01/22/13  Yes Brooke O Edmisten, PA-C  metoprolol (LOPRESSOR) 100 MG tablet Take 1 tablet (100 mg total) by mouth 2 (two) times daily. 07/03/13  Yes Satira Sark, MD  nitroGLYCERIN (NITROSTAT) 0.4 MG SL tablet Place 1 tablet (0.4 mg total) under the tongue every 5 (five) minutes x 3 doses as needed for chest pain. 01/31/13  Yes Rogelia Mire, NP  pantoprazole (PROTONIX) 40 MG tablet Take 1 tablet (40 mg total) by mouth daily. 01/31/13  Yes Rogelia Mire, NP  potassium chloride SA (K-DUR,KLOR-CON) 20 MEQ tablet Take 20 mEq by mouth daily.   Yes Historical Provider, MD  torsemide (DEMADEX) 20 MG tablet Take 2 tablets (40 mg total) by mouth 2 (two) times daily. 07/20/13  Yes Satira Sark, MD  warfarin (COUMADIN) 5 MG tablet Take 5-7.5 mg by mouth daily. Take 1 1/2 tablets (7.5 mg) on Monday, Wednesday, Friday, take 1 tablet (5 mg) on Sunday,  Tuesday, Thursday and Saturday   Yes Historical Provider, MD  acetaminophen (TYLENOL) 325 MG tablet Take 2 tablets (650 mg total) by mouth every 6 (six) hours as needed. For pain 01/22/13   Brooke O Edmisten, PA-C  ferrous sulfate 325 (65 FE) MG EC tablet Take 325 mg by mouth daily with breakfast.    Historical Provider, MD  folic acid (FOLVITE) 1 MG tablet Take 1 tablet (1 mg total) by mouth daily. 01/22/13   Brooke O Edmisten, PA-C  magnesium oxide (MAG-OX) 400 (241.3 MG) MG tablet Take 1 tablet (400 mg total) by mouth daily. 01/31/13   Rogelia Mire, NP  Multiple Vitamin (MULTIVITAMIN WITH MINERALS) TABS Take 1 tablet by mouth daily. 01/22/13   Brooke O Edmisten, PA-C  thiamine 100 MG tablet Take 1 tablet (100 mg total) by mouth daily. 01/22/13   Azzie Roup Edmisten, PA-C    Current Medications: . antiseptic oral rinse  15 mL Mouth Rinse QID  . aspirin  325 mg Per Tube Daily  . chlorhexidine  15 mL Mouth Rinse  BID  . insulin aspart  1-3 Units Subcutaneous Q4H  . ipratropium-albuterol  3 mL Nebulization Q6H  . pantoprazole (PROTONIX) IV  40 mg Intravenous QHS  . Warfarin - Pharmacist Dosing Inpatient   Does not apply q1800     Allergies:   No Known Allergies  Social History:   The patient  reports that he has been smoking Cigarettes.  He has a 13 pack-year smoking history. He has never used smokeless tobacco. He reports that he drinks about 4.2 ounces of alcohol per week. He reports that he does not use illicit drugs.    Family History:   The patient's family history is negative for Hypotension, Anesthesia problems, Malignant hyperthermia, Pseudochol deficiency, Colon cancer, and Liver disease.   ROS:  Unable to obtain due to sedation  Vital Signs: Blood pressure 137/49, pulse 65, temperature 99 F (37.2 C), temperature source Rectal, resp. rate 17, height 5\' 7"  (1.702 m), weight 65.4 kg (144 lb 2.9 oz), SpO2 99.00%.   PHYSICAL EXAM: General:  Intubated, sedated but responds to vocal  stimuli HEENT: normal Lymph: no adenopathy Neck: no JVD Endocrine:  No thryomegaly Cardiac:  Mechanical S2 at apex.  No m/g/r.  Warm, well perfused. Lungs:  clear to auscultation anteriorly Abd: soft, nontender, no hepatomegaly Ext: 1+ pitting edema LLE Musculoskeletal:  No deformities, BUE and BLE strength normal and equal Skin: warm and dry Neuro:  CNs 2-12 intact, no focal abnormalities noted Psych:  Normal affect   EKG:  Appears to have had some dynamic ST changes around the time of intubation/hypotension, but overall similar to prior in November.  Labs:  Recent Labs  07/23/13 1008 07/23/13 1353 07/23/13 1852 07/24/13 0052  TROPONINI <0.30 1.32* 4.35* 3.22*   Lab Results  Component Value Date   WBC 12.7* 07/24/2013   HGB 10.3* 07/24/2013   HCT 30.1* 07/24/2013   MCV 97.7 07/24/2013   PLT 218 07/24/2013    Recent Labs Lab 07/24/13 0300  NA 135*  K 4.5  CL 95*  CO2 25  BUN 32*  CREATININE 1.21  CALCIUM 8.5  PROT 6.0  BILITOT 0.5  ALKPHOS 103  ALT 26  AST 52*  GLUCOSE 162*   Lab Results  Component Value Date   CHOL 147 01/28/2013   HDL 64 01/28/2013   LDLCALC 74 01/28/2013   TRIG 47 01/28/2013   No results found for this basename: DDIMER   Lab Results  Component Value Date   INR 4.69* 07/24/2013   INR 3.54* 07/23/2013   INR 2.6 07/03/2013     Radiology/Studies:  Dg Chest Port 1 View  07/23/2013   CLINICAL DATA:  Central line placement.  EXAM: PORTABLE CHEST - 1 VIEW  COMPARISON:  Chest x-ray 08/25/2013.  FINDINGS: An endotracheal tube is in place with tip 4.8 cm above the carina. A nasogastric tube is seen extending into the stomach, however, the tip of the nasogastric tube extends below the lower margin of the image. There is a left-sided internal jugular central venous catheter with tip terminating in the proximal superior vena cava. Lung volumes are slightly low. There continues to be widespread interstitial and airspace disease throughout the lungs  bilaterally, with some cephalization of the pulmonary vasculature, most compatible with moderate pulmonary edema. Overall, this is slightly improved compared to yesterday's examination. Probable trace bilateral pleural effusions. Heart size remains mildly enlarged. The patient is rotated to the right on today's exam, resulting in distortion of the mediastinal contours and reduced diagnostic sensitivity  and specificity for mediastinal pathology. Atherosclerosis in the thoracic aorta. Status post median sternotomy for CABG and mechanical mitral and aortic valve replacements.  IMPRESSION: 1. Overall, there appears to be slightly improved aeration compared to yesterday's examination, however, there is still evidence for moderate congestive heart failure. 2. Support apparatus and postoperative changes, as above.   Electronically Signed   By: Vinnie Langton M.D.   On: 07/23/2013 14:22   Dg Chest Portable 1 View  07/23/2013   CLINICAL DATA:  Endotracheal tube placement  EXAM: PORTABLE CHEST - 1 VIEW  COMPARISON:  05/29/2013  FINDINGS: Moderate cardiac enlargement. Status post CABG and replacement cardiac valve placement. Endotracheal tube is seen with tip 3.9 cm above the carina. There is vascular congestion in. There is moderate to severe interstitial prominence with more confluent alveolar opacities in the mid lung zones, right worse than left.  IMPRESSION: Endotracheal tube as described. Congestive heart failure with moderate to severe pulmonary edema.   Electronically Signed   By: Skipper Cliche M.D.   On: 07/23/2013 10:10    ASSESSMENT AND PLAN:  1. Respiratory Arrest likely secondary to pulmonary edema in setting of severe COPD - Agree with current management.  CVP is 6.  Off pressors.  2 liters negative overnight, can use lasix PRN - Can restart metoprolol at low dose (6.25mg  q6hr)  2. NSTEMI - likely in the setting of demand ischemia during hypotension/hypoxia - on coumadin for mechanical valves,  continue aspirin.  Restart BB as above.  3. AFib - rates currently controlled, restart metoprolol as above  4. S/p MVR, AVR - on exam valves appear to be functioning normally.  Continue coumadin with INR goal 2.5-3.5    Cleotis Lema 07/24/2013 5:17 AM

## 2013-07-24 NOTE — Progress Notes (Signed)
Echocardiogram 2D Echocardiogram has been performed.  [name] 07/24/2013, 9:56 AM

## 2013-07-24 NOTE — Progress Notes (Signed)
Name: [name] MRN: [id number] DOB: [birth year]    ADMISSION DATE:  07/23/2013  REFERRING MD :  Forestine Na EDP  PRIMARY SERVICE: PCCM  CHIEF COMPLAINT:  Respiratory failure   BRIEF PATIENT DESCRIPTION: 68 yo male with hx COPD, CHF, CAD, St Jude valve replacement on coumadin presented 1/18 to [facility] ER with severe respiratory distress requiring intubation.  Brief cardiac arrest prior to intubation. Tx to [facility].    SIGNIFICANT EVENTS / STUDIES:  1/18 Pt presented to [facility] hypoxic and intubated with brief arrest during intubation. ProBNP elevated and fluid overloaded.   LINES / TUBES: ETT 1/18>>> OGT 1/18 >>> Foley 1/18 >>>  CULTURES: BC x 2 1/18>>> Urine 1/18>>> Sputum 1/18>>>  ANTIBIOTICS: None  Subjective: Patient with negative 1.5 L overnight and CXR improved. Off pressors. No distress.  VITAL SIGNS: Temp:  [95.8 F (35.4 C)-100 F (37.8 C)] 99 F (37.2 C) (01/19 0500) Pulse Rate:  [58-88] 65 (01/19 0500) Resp:  [16-28] 17 (01/19 0500) BP: (110-179)/(40-78) 137/49 mmHg (01/19 0500) SpO2:  [71 %-100 %] 99 % (01/19 0500) Arterial Line BP: (103-154)/(43-96) 144/50 mmHg (01/19 0500) FiO2 (%):  [70 %-100 %] 70 % (01/19 0348) Weight:  [144 lb 2.9 oz (65.4 kg)-160 lb (72.576 kg)] 144 lb 10 oz (65.6 kg) (01/19 0500) HEMODYNAMICS: CVP:  [8 mmHg] 8 mmHg VENTILATOR SETTINGS: Vent Mode:  [-] PRVC FiO2 (%):  [70 %-100 %] 70 % Set Rate:  [12 bmp-16 bmp] 16 bmp Vt Set:  [500 mL] 500 mL PEEP:  [5 cmH20-8 cmH20] 8 cmH20 Plateau Pressure:  [14 cmH20-18 cmH20] 15 cmH20 INTAKE / OUTPUT: Intake/Output     01/18 0701 - 01/19 0700 01/19 0701 - 01/20 0700   I.V. (mL/kg) 356.3 (5.4)    NG/GT 180    IV Piggyback 50    Total Intake(mL/kg) 586.3 (8.9)    Urine (mL/kg/hr) 2410    Total Output 2410     Net -1823.7           PHYSICAL EXAMINATION: General:  Intubated and sedated Neuro:  Opens eyes to name and follows basic commands HEENT:  PERRLA Cardiovascular:   S1 S2 normal, HV click noted, regular rate Lungs:  Wheezing diffusely with some rales at the bases Abdomen:  +BS, non-tender, non-distended Musculoskeletal:  Moves all 4 extremities Skin:  Some redness around rectum stage I versus moisture associated skin changes  LABS:  CBC  Recent Labs Lab 07/23/13 1008 07/24/13 0300  WBC 7.8 12.7*  HGB 12.3* 10.3*  HCT 35.8* 30.1*  PLT 325 218   Coag's  Recent Labs Lab 07/23/13 1010 07/24/13 0300  INR 3.54* 4.69*   BMET  Recent Labs Lab 07/23/13 1008 07/24/13 0300  NA 135* 135*  K 4.4 4.5  CL 95* 95*  CO2 20 25  BUN 30* 32*  CREATININE 1.16 1.21  GLUCOSE 106* 162*   Electrolytes  Recent Labs Lab 07/23/13 1008 07/24/13 0300  CALCIUM 8.7 8.5  MG  --  1.8   Sepsis Markers  Recent Labs Lab 07/23/13 1009 07/23/13 2050 07/24/13 0052  LATICACIDVEN 5.2* 1.4 1.5   ABG  Recent Labs Lab 07/23/13 1120 07/23/13 1450  PHART 7.197* 7.272*  PCO2ART 62.0* 57.2*  PO2ART 80.4 70.0*   Liver Enzymes  Recent Labs Lab 07/23/13 1008 07/24/13 0300  AST 52* 52*  ALT 30 26  ALKPHOS 142* 103  BILITOT 0.9 0.5  ALBUMIN 3.6 2.9*   Cardiac Enzymes  Recent Labs Lab 07/23/13  1008 07/23/13 1353 07/23/13 1852 07/24/13 0052  TROPONINI <0.30 1.32* 4.35* 3.22*  PROBNP 8081.0*  --   --   --    Glucose  Recent Labs Lab 07/23/13 1240 07/23/13 1517 07/23/13 1948 07/23/13 2347 07/24/13 0334  GLUCAP 176* 206* 136* 150* 159*    Imaging Dg Chest Port 1 View  07/23/2013   CLINICAL DATA:  Central line placement.  EXAM: PORTABLE CHEST - 1 VIEW  COMPARISON:  Chest x-ray 08/25/2013.  FINDINGS: An endotracheal tube is in place with tip 4.8 cm above the carina. A nasogastric tube is seen extending into the stomach, however, the tip of the nasogastric tube extends below the lower margin of the image. There is a left-sided internal jugular central venous catheter with tip terminating in the proximal superior vena cava. Lung  volumes are slightly low. There continues to be widespread interstitial and airspace disease throughout the lungs bilaterally, with some cephalization of the pulmonary vasculature, most compatible with moderate pulmonary edema. Overall, this is slightly improved compared to yesterday's examination. Probable trace bilateral pleural effusions. Heart size remains mildly enlarged. The patient is rotated to the right on today's exam, resulting in distortion of the mediastinal contours and reduced diagnostic sensitivity and specificity for mediastinal pathology. Atherosclerosis in the thoracic aorta. Status post median sternotomy for CABG and mechanical mitral and aortic valve replacements.  IMPRESSION: 1. Overall, there appears to be slightly improved aeration compared to yesterday's examination, however, there is still evidence for moderate congestive heart failure. 2. Support apparatus and postoperative changes, as above.   Electronically Signed   By: Vinnie Langton M.D.   On: 07/23/2013 14:22   Dg Chest Portable 1 View  07/23/2013   CLINICAL DATA:  Endotracheal tube placement  EXAM: PORTABLE CHEST - 1 VIEW  COMPARISON:  05/29/2013  FINDINGS: Moderate cardiac enlargement. Status post CABG and replacement cardiac valve placement. Endotracheal tube is seen with tip 3.9 cm above the carina. There is vascular congestion in. There is moderate to severe interstitial prominence with more confluent alveolar opacities in the mid lung zones, right worse than left.  IMPRESSION: Endotracheal tube as described. Congestive heart failure with moderate to severe pulmonary edema.   Electronically Signed   By: Skipper Cliche M.D.   On: 07/23/2013 10:10   CXR: improved pulmonary edema, right more than left, ETT good position  ASSESSMENT / PLAN:  PULMONARY Acute respiratory failure - Decompensated CHFwith COPD  COPD Pulmonary Edema   P:   Vent support  Diuresis as below  CXR in AM  Daily SBT  Albuterol/Atrovent nebs q  6 h VAP Px  CARDIOVASCULAR Acute on chronic diastolic CHF -- EF 02% (5/42/7062), dry weight unclear but lowest recently is 155. Today 144. CAD - s/p CABG/MVR/AVR  Chronic anticoagulation  Elevated troponin - Likely secondary to hypoxia, down trending.  P:  2D echo Lasix 120 mg today Off pressors Cardiology recs appreciated Hold home norvasc, metoprolol Coumadin per pharmacy goal 2.5-3.5   RENAL Hyponatremia -chronic  P:   Trend BMP, Mag  GASTROINTESTINAL A:  GI Px Nutrition P:   Protonix TF   HEMATOLOGIC A:  Cardiogenic shock - EF low with fluid overload, needs diuresis. Resolving, off pressors. Hypothermia - Resolved P:  Monitor, consider restart beta-blocker.  INFECTIOUS A:  No active, U/A unremarkable, blood and respiratory cultures pending. P:   Monitor off Abx, follow cultures  ENDOCRINE A:  No active P:   Check CBG  NEUROLOGIC A:  Acute encephalopathy - Likely  from hypoxia. Follows basic commands on arrival. P:   Precedex gtt Fentanyl prn Versed prn Daily Ernest Haber, PGY-3 07/24/2013  7:10 AM   PCCM ATTENDING: I have interviewed and examined the patient and reviewed the database. I have formulated the assessment and plan as reflected in the note above with amendments made by me. 35 mins of direct critical care time provided  Merton Border, MD;  PCCM service; Mobile [phone]

## 2013-07-24 NOTE — Progress Notes (Signed)
ANTICOAGULATION CONSULT NOTE - Follow Up Consult  Pharmacy Consult for Coumadin  Indication: Afib, AVR, MVR   No Known Allergies  Patient Measurements: Height: 5\' 7"  (170.2 cm) Weight: 144 lb 10 oz (65.6 kg) IBW/kg (Calculated) : 66.1  Vital Signs: Temp: 95.8 F (35.4 C) (01/19 0900) BP: 155/52 mmHg (01/19 1400) Pulse Rate: 77 (01/19 1400)  Labs:  Recent Labs  07/23/13 1008 07/23/13 1010  07/23/13 1852 07/24/13 0052 07/24/13 0300 07/24/13 0800  HGB 12.3*  --   --   --   --  10.3*  --   HCT 35.8*  --   --   --   --  30.1*  --   PLT 325  --   --   --   --  218  --   LABPROT  --  34.1*  --   --   --  42.3*  --   INR  --  3.54*  --   --   --  4.69*  --   CREATININE 1.16  --   --   --   --  1.21  --   TROPONINI <0.30  --   < > 4.35* 3.22*  --  3.06*  < > = values in this interval not displayed.  Estimated Creatinine Clearance: 54.2 ml/min (by C-G formula based on Cr of 1.21).   Assessment: 68 year old male on chronic Coumadin therapy for Afib, MVR, AVR admitted on 1/18 for respiratory distress and brief cardiac arrest. Patient's Coumadin dose at home is 7.5 mg MWF, and 5 mg all other days. Per wife, last dose taken 1/17 evening. Dose held on 1/18 for elevated INR 3.54, and  today's INR continues to be supratherapeutic at 4.69. Patient has no signs of bleeding at this time.    Goal of Therapy:  INR 2.5-3.5 Monitor platelets by anticoagulation protocol: Yes   Plan:  1. Hold Coumadin dose tonight 2. Monitor daily INR, CBC, and signs of bleeding   Baker,Katherine PharmD Candidate  07/24/2013,2:47 PM  I agree with above.  Sherlon Handing, PharmD, BCPS Clinical pharmacist, pager [phone] 07/24/2013  2:57 PM

## 2013-07-24 NOTE — Progress Notes (Deleted)
CRITICAL VALUE ALERT  Critical value received:  K 2.9   Date of notification:  07/24/2013  Time of notification:  7:17 AM   Critical value read back:yes  Nurse who received alert:  Mick Sell RN  MD notified (1st page):  Dr. Angelia Mould  Time of first page:  07/24/2013  MD notified (2nd page):  Time of second page:  Responding MD:  Dr. Angelia Mould  Time MD responded:  7:18 AM

## 2013-07-24 NOTE — Progress Notes (Signed)
Consult note reviewed, will f/u after echo performed.  A/P from consult note this am:  ASSESSMENT AND PLAN:  1. Respiratory Arrest likely secondary to pulmonary edema in setting of severe COPD - Agree with current management. CVP is 6. Off pressors. 2 liters negative overnight, can use lasix PRN  - Can restart metoprolol at low dose (6.25mg  q6hr)   2. NSTEMI  - likely in the setting of demand ischemia during hypotension/hypoxia  - on coumadin for mechanical valves, continue aspirin. Restart BB as above.   3. AFib  - rates currently controlled, restart metoprolol as above   4. S/p MVR, AVR  - on exam valves appear to be functioning normally. Continue coumadin with INR goal 2.5-3.5   Rosaria Ferries, PA-C 07/24/2013 7:22 AM Beeper [phone]  Patient seen with PA, agree with note.  Echo showed EF 50-55%, valves appeared to be functioning normally.  Suspect demand ischemia in setting of acute diastolic CHF. Also with severe COPD.  CVP 6 most recently.   Loralie Champagne 07/24/2013 1:47 PM

## 2013-07-24 NOTE — Progress Notes (Signed)
Respiratory therapy note- Advanced ETT 2 cm to 25, patient with significant cuff leak, MD aware and order written.

## 2013-07-24 NOTE — Progress Notes (Signed)
Pt with 1 gold wedding band and a [employer] ring.  Rings sent home with sister in law Randy Castrejon.

## 2013-07-24 NOTE — Progress Notes (Signed)
UR Completed.  [name] Jane [phone] 07/24/2013  

## 2013-07-24 NOTE — Progress Notes (Signed)
Pt with elevated BP.  Spoke with Dr Joni Reining regarding BP.  Order received for Lopressor scheduled.  Lopressor given.  Will continue to monitor.

## 2013-07-25 DIAGNOSIS — J449 Chronic obstructive pulmonary disease, unspecified: Secondary | ICD-10-CM

## 2013-07-25 DIAGNOSIS — Z954 Presence of other heart-valve replacement: Secondary | ICD-10-CM

## 2013-07-25 LAB — GLUCOSE, CAPILLARY
GLUCOSE-CAPILLARY: 117 mg/dL — AB (ref 70–99)
GLUCOSE-CAPILLARY: 120 mg/dL — AB (ref 70–99)
GLUCOSE-CAPILLARY: 138 mg/dL — AB (ref 70–99)
Glucose-Capillary: 133 mg/dL — ABNORMAL HIGH (ref 70–99)
Glucose-Capillary: 136 mg/dL — ABNORMAL HIGH (ref 70–99)
Glucose-Capillary: 112 mg/dL — ABNORMAL HIGH (ref 70–99)

## 2013-07-25 LAB — URINALYSIS, ROUTINE W REFLEX MICROSCOPIC
Bilirubin Urine: NEGATIVE
Glucose, UA: 100 mg/dL — AB
Hgb urine dipstick: NEGATIVE
KETONES UR: NEGATIVE mg/dL
LEUKOCYTES UA: NEGATIVE
Nitrite: NEGATIVE
PROTEIN: NEGATIVE mg/dL
Specific Gravity, Urine: 1.016 (ref 1.005–1.030)
UROBILINOGEN UA: 1 mg/dL (ref 0.0–1.0)
pH: 7 (ref 5.0–8.0)

## 2013-07-25 LAB — BASIC METABOLIC PANEL
BUN: 25 mg/dL — AB (ref 6–23)
CALCIUM: 8.7 mg/dL (ref 8.4–10.5)
CO2: 28 mEq/L (ref 19–32)
Chloride: 98 mEq/L (ref 96–112)
Creatinine, Ser: 0.85 mg/dL (ref 0.50–1.35)
GFR, EST NON AFRICAN AMERICAN: 88 mL/min — AB (ref >90)
Glucose, Bld: 158 mg/dL — ABNORMAL HIGH (ref 70–99)
POTASSIUM: 3.9 meq/L (ref 3.7–5.3)
Sodium: 138 mEq/L (ref 137–147)

## 2013-07-25 LAB — CBC
HCT: 32.7 % — ABNORMAL LOW (ref 39.0–52.0)
HEMOGLOBIN: 11 g/dL — AB (ref 13.0–17.0)
MCH: 33.7 pg (ref 26.0–34.0)
MCHC: 33.6 g/dL (ref 30.0–36.0)
MCV: 100.3 fL — ABNORMAL HIGH (ref 78.0–100.0)
PLATELETS: 208 10*3/uL (ref 150–400)
RBC: 3.26 MIL/uL — ABNORMAL LOW (ref 4.22–5.81)
RDW: 14.5 % (ref 11.5–15.5)
WBC: 9.2 10*3/uL (ref 4.0–10.5)

## 2013-07-25 LAB — PROTIME-INR
INR: 4.55 — ABNORMAL HIGH (ref 0.00–1.49)
PROTHROMBIN TIME: 41.3 s — AB (ref 11.6–15.2)

## 2013-07-25 MED ORDER — FENTANYL CITRATE 0.05 MG/ML IJ SOLN: 25.0000 ug | INTRAMUSCULAR | Status: DC | PRN

## 2013-07-25 MED ORDER — POTASSIUM CHLORIDE 20 MEQ/15ML (10%) PO LIQD
40.0000 meq | Freq: Once | ORAL | Status: AC
  Administered 2013-07-25: 40 meq via ORAL
  Filled 2013-07-25: qty 30

## 2013-07-25 MED ORDER — AMLODIPINE BESYLATE 10 MG PO TABS
10.0000 mg | ORAL_TABLET | Freq: Every day | ORAL | Status: DC
Start: 2013-07-25 — End: 2013-07-30
  Administered 2013-07-25 – 2013-07-30 (×6): 10 mg via ORAL
  Filled 2013-07-25 (×7): qty 1

## 2013-07-25 MED ORDER — ATORVASTATIN CALCIUM 40 MG PO TABS
40.0000 mg | ORAL_TABLET | Freq: Every day | ORAL | Status: DC
  Administered 2013-07-25 – 2013-07-29 (×5): 40 mg via ORAL
  Filled 2013-07-25 (×6): qty 1

## 2013-07-25 MED ORDER — POTASSIUM CHLORIDE 20 MEQ/15ML (10%) PO LIQD
ORAL | Status: AC
  Administered 2013-07-25: 40 meq via ORAL
  Filled 2013-07-25: qty 30

## 2013-07-25 MED ORDER — FUROSEMIDE 10 MG/ML IJ SOLN
40.0000 mg | Freq: Two times a day (BID) | INTRAMUSCULAR | Status: DC
  Administered 2013-07-25 – 2013-07-27 (×5): 40 mg via INTRAVENOUS
  Filled 2013-07-25 (×7): qty 4

## 2013-07-25 MED ORDER — OXYCODONE HCL 5 MG PO TABS: 5.0000 mg | ORAL_TABLET | ORAL | Status: DC | PRN

## 2013-07-25 MED ORDER — METOPROLOL TARTRATE 50 MG PO TABS
50.0000 mg | ORAL_TABLET | Freq: Two times a day (BID) | ORAL | Status: DC
  Administered 2013-07-25 – 2013-07-26 (×3): 50 mg via ORAL
  Filled 2013-07-25 (×4): qty 1

## 2013-07-25 MED ORDER — ASPIRIN 81 MG PO CHEW
81.0000 mg | CHEWABLE_TABLET | Freq: Every day | ORAL | Status: DC
  Administered 2013-07-25 – 2013-07-30 (×6): 81 mg via ORAL
  Filled 2013-07-25 (×6): qty 1

## 2013-07-25 MED ORDER — ASPIRIN 325 MG PO TABS: 325.0000 mg | ORAL_TABLET | Freq: Every day | ORAL | Status: DC

## 2013-07-25 MED ORDER — ACETAMINOPHEN 325 MG PO TABS
650.0000 mg | ORAL_TABLET | Freq: Four times a day (QID) | ORAL | Status: DC | PRN
  Administered 2013-07-25: 650 mg via ORAL
  Filled 2013-07-25: qty 2

## 2013-07-25 NOTE — Progress Notes (Signed)
Name: [name] MRN: [id number] DOB: Apr 02, 1946    ADMISSION DATE:  07/23/2013  REFERRING MD :  Forestine Na EDP  PRIMARY SERVICE: PCCM  CHIEF COMPLAINT:  Respiratory failure   BRIEF PATIENT DESCRIPTION: 68 yo male with hx COPD, CHF, CAD, St Jude valve replacement on coumadin presented 1/18 to [facility] ER with severe respiratory distress requiring intubation.  Brief cardiac arrest prior to intubation. Tx to [facility].    SIGNIFICANT EVENTS / STUDIES:  1/18 Pt presented to [facility] hypoxic and intubated with brief arrest during intubation. ProBNP elevated and fluid overloaded.   LINES / TUBES: ETT 1/18>>> OGT 1/18 >>> Foley 1/18 >>>  CULTURES: BC x 2 1/18>>> Urine 1/18>>> Sputum 1/18>>>  ANTIBIOTICS: None  Subjective: Patient with negative 1.6 L overnight. No distress.  VITAL SIGNS: Temp:  [95.8 F (35.4 C)-102 F (38.9 C)] 97.6 F (36.4 C) (01/20 0400) Pulse Rate:  [65-89] 89 (01/20 0500) Resp:  [15-26] 20 (01/20 0500) BP: (136-204)/(46-64) 144/49 mmHg (01/20 0500) SpO2:  [94 %-99 %] 94 % (01/20 0500) Arterial Line BP: (136-181)/(47-74) 178/74 mmHg (01/20 0500) FiO2 (%):  [40 %-60 %] 40 % (01/20 0337) Weight:  [149 lb 4 oz (67.7 kg)] 149 lb 4 oz (67.7 kg) (01/20 0400) HEMODYNAMICS: CVP:  [1 mmHg-8 mmHg] 7 mmHg VENTILATOR SETTINGS: Vent Mode:  [-] PRVC FiO2 (%):  [40 %-60 %] 40 % Set Rate:  [16 bmp] 16 bmp Vt Set:  [500 mL] 500 mL PEEP:  [8 cmH20] 8 cmH20 Plateau Pressure:  [14 cmH20-16 cmH20] 16 cmH20 INTAKE / OUTPUT: Intake/Output     01/19 0701 - 01/20 0700 01/20 0701 - 01/21 0700   I.V. (mL/kg) 650.4 (9.6)    NG/GT 603.3    IV Piggyback 62    Total Intake(mL/kg) 1315.7 (19.4)    Urine (mL/kg/hr) 2990 (1.8)    Total Output 2990     Net -1674.3           PHYSICAL EXAMINATION: General:  Intubated and sedated Neuro:  Opens eyes to name and follows basic commands HEENT:  PERRLA Cardiovascular:  S1 S2 normal, HV click noted, regular rate Lungs:   Wheezing diffusely with some rales at the bases Abdomen:  +BS, non-tender, non-distended Musculoskeletal:  Moves all 4 extremities Skin:  No active  LABS:  CBC  Recent Labs Lab 07/23/13 1008 07/24/13 0300 07/25/13 0415  WBC 7.8 12.7* 9.2  HGB 12.3* 10.3* 11.0*  HCT 35.8* 30.1* 32.7*  PLT 325 218 208   Coag's  Recent Labs Lab 07/23/13 1010 07/24/13 0300 07/25/13 0415  INR 3.54* 4.69* 4.55*   BMET  Recent Labs Lab 07/23/13 1008 07/24/13 0300 07/25/13 0415  NA 135* 135* 138  K 4.4 4.5 3.9  CL 95* 95* 98  CO2 20 25 28   BUN 30* 32* 25*  CREATININE 1.16 1.21 0.85  GLUCOSE 106* 162* 158*   Electrolytes  Recent Labs Lab 07/23/13 1008 07/24/13 0300 07/25/13 0415  CALCIUM 8.7 8.5 8.7  MG  --  1.8  --    Sepsis Markers  Recent Labs Lab 07/23/13 1009 07/23/13 2050 07/24/13 0052  LATICACIDVEN 5.2* 1.4 1.5   ABG  Recent Labs Lab 07/23/13 1120 07/23/13 1450  PHART 7.197* 7.272*  PCO2ART 62.0* 57.2*  PO2ART 80.4 70.0*   Liver Enzymes  Recent Labs Lab 07/23/13 1008 07/24/13 0300  AST 52* 52*  ALT 30 26  ALKPHOS 142* 103  BILITOT 0.9 0.5  ALBUMIN 3.6 2.9*  Cardiac Enzymes  Recent Labs Lab 07/23/13 1008  07/23/13 1852 07/24/13 0052 07/24/13 0800  TROPONINI <0.30  < > 4.35* 3.22* 3.06*  PROBNP 8081.0*  --   --   --   --   < > = values in this interval not displayed. Glucose  Recent Labs Lab 07/24/13 0740 07/24/13 1151 07/24/13 1518 07/24/13 1920 07/25/13 0007 07/25/13 0354  GLUCAP 131* 132* 136* 148* 138* 133*    Imaging Dg Chest Port 1 View  07/24/2013   CLINICAL DATA:  Ventilator  EXAM: PORTABLE CHEST - 1 VIEW  COMPARISON:  07/24/2013  FINDINGS: Endotracheal tube remains in good position. NG tube tip not well seen but may be in the distal esophagus. Left jugular catheter tip in the SVC in unchanged. No pneumothorax  Diffuse bilateral airspace disease shows mild interval improvement. Possible edema.  IMPRESSION: Mild  improvement in diffuse bilateral airspace disease consistent with edema.   Electronically Signed   By: Franchot Gallo M.D.   On: 07/24/2013 14:13   Dg Chest Port 1 View  07/24/2013   CLINICAL DATA:  Acute respiratory failure. On ventilator. Congestive heart failure. Coronary artery disease.  EXAM: PORTABLE CHEST - 1 VIEW  COMPARISON:  07/23/2013  FINDINGS: Support lines and tubes in appropriate position. Diffuse bilateral airspace disease and probable layering bilateral pleural effusion show no significant change. Cardiomegaly is stable.  IMPRESSION: No significant change compared with prior exam.   Electronically Signed   By: Earle Gell M.D.   On: 07/24/2013 07:17   Dg Chest Port 1 View  07/23/2013   CLINICAL DATA:  Central line placement.  EXAM: PORTABLE CHEST - 1 VIEW  COMPARISON:  Chest x-ray 08/25/2013.  FINDINGS: An endotracheal tube is in place with tip 4.8 cm above the carina. A nasogastric tube is seen extending into the stomach, however, the tip of the nasogastric tube extends below the lower margin of the image. There is a left-sided internal jugular central venous catheter with tip terminating in the proximal superior vena cava. Lung volumes are slightly low. There continues to be widespread interstitial and airspace disease throughout the lungs bilaterally, with some cephalization of the pulmonary vasculature, most compatible with moderate pulmonary edema. Overall, this is slightly improved compared to yesterday's examination. Probable trace bilateral pleural effusions. Heart size remains mildly enlarged. The patient is rotated to the right on today's exam, resulting in distortion of the mediastinal contours and reduced diagnostic sensitivity and specificity for mediastinal pathology. Atherosclerosis in the thoracic aorta. Status post median sternotomy for CABG and mechanical mitral and aortic valve replacements.  IMPRESSION: 1. Overall, there appears to be slightly improved aeration compared to  yesterday's examination, however, there is still evidence for moderate congestive heart failure. 2. Support apparatus and postoperative changes, as above.   Electronically Signed   By: Vinnie Langton M.D.   On: 07/23/2013 14:22   Dg Chest Portable 1 View  07/23/2013   CLINICAL DATA:  Endotracheal tube placement  EXAM: PORTABLE CHEST - 1 VIEW  COMPARISON:  05/29/2013  FINDINGS: Moderate cardiac enlargement. Status post CABG and replacement cardiac valve placement. Endotracheal tube is seen with tip 3.9 cm above the carina. There is vascular congestion in. There is moderate to severe interstitial prominence with more confluent alveolar opacities in the mid lung zones, right worse than left.  IMPRESSION: Endotracheal tube as described. Congestive heart failure with moderate to severe pulmonary edema.   Electronically Signed   By: Skipper Cliche M.D.   On: 07/23/2013  10:10   CXR: improved pulmonary edema, ETT good position  ASSESSMENT / PLAN:  PULMONARY Acute respiratory failure - Decompensated CHFwith COPD  COPD Pulmonary Edema   P:   Extubate today Hold diuresis Albuterol/Atrovent nebs q 6 h  CARDIOVASCULAR Acute on chronic diastolic CHF -- EF [id number] (99991111), dry weight unclear but lowest recently is 155. CAD - s/p CABG/MVR/AVR  Chronic anticoagulation  Elevated troponin - Likely secondary to hypoxia, down trending.  P:  ASA daily Metoprolol 50 q 12 Coumadin per pharmacy goal 2.5-3.5   RENAL Hyponatremia -chronic  P:   Trend BMP, Mag  GASTROINTESTINAL A:  GI Px Nutrition P:   D/C Protonix Hold TF  Assess diet tonight or tomorrow  HEMATOLOGIC A:  Cardiogenic shock - Resolved Hypothermia - Resolved P:  Monitor.  INFECTIOUS A:  No active, U/A unremarkable, blood and respiratory cultures pending.  P:   Monitor off Abx, follow cultures  ENDOCRINE A:  No active P:   Check CBG  NEUROLOGIC A:  Acute encephalopathy - Follows commands on wake up. P:   D/C sedation  once extubated  Vertell Novak, PGY-3 07/25/2013  7:02 AM   PCCM ATTENDING: I have interviewed and examined the patient and reviewed the database. I have formulated the assessment and plan as reflected in the note above with amendments made by me. 35 mins of direct critical care time provided. Discussed with Dr Debbrah Alar, MD;  PCCM service; Mobile [phone]

## 2013-07-25 NOTE — Procedures (Addendum)
Extubation Procedure Note  Patient Details:   Name: [name] DOB: October 12, 1945 MRN: [id number]   Airway Documentation:     Evaluation

## 2013-07-25 NOTE — Progress Notes (Signed)
ANTICOAGULATION CONSULT NOTE - Follow Up Consult  Pharmacy Consult for Coumadin  Indication: Afib, AVR, MVR   No Known Allergies  Patient Measurements: Height: 5\' 7"  (170.2 cm) Weight: 149 lb 4 oz (67.7 kg) IBW/kg (Calculated) : 66.1  Vital Signs: Temp: 100.6 F (38.1 C) (01/20 0809) Temp src: Oral (01/20 0809) BP: 165/51 mmHg (01/20 0800) Pulse Rate: 85 (01/20 0800)  Labs:  Recent Labs  07/23/13 1008 07/23/13 1010  07/23/13 1852 07/24/13 0052 07/24/13 0300 07/24/13 0800 07/25/13 0415  HGB 12.3*  --   --   --   --  10.3*  --  11.0*  HCT 35.8*  --   --   --   --  30.1*  --  32.7*  PLT 325  --   --   --   --  218  --  208  LABPROT  --  34.1*  --   --   --  42.3*  --  41.3*  INR  --  3.54*  --   --   --  4.69*  --  4.55*  CREATININE 1.16  --   --   --   --  1.21  --  0.85  TROPONINI <0.30  --   < > 4.35* 3.22*  --  3.06*  --   < > = values in this interval not displayed.  Estimated Creatinine Clearance: 77.8 ml/min (by C-G formula based on Cr of 0.85).   Assessment: 68 year old male on chronic Coumadin therapy for Afib, MVR, AVR admitted on 1/18 for respiratory distress and brief cardiac arrest. Patient's Coumadin dose at home is 7.5 mg MWF, and 5 mg all other days. Per wife, last dose taken 1/17 evening. Dose held on 1/19 for elevated INR 4.69, and  today's INR continues to be supratherapeutic at 4.55. Patient has no signs of bleeding at this time.    Goal of Therapy:  INR 2.5-3.5 Monitor platelets by anticoagulation protocol: Yes   Plan:  1. Hold Coumadin dose tonight 2. Monitor daily INR, CBC, and signs of bleeding   Baker,Katherine PharmD Candidate  07/25/2013,8:30 AM  I agree with above.  Sherlon Handing, PharmD, BCPS Clinical pharmacist, pager [phone] 07/25/2013  11:13 AM

## 2013-07-25 NOTE — Procedures (Signed)
Extubation Procedure Note  Patient Details:   Name: [name] DOB: 17-Feb-1946 MRN: [id number]   Airway Documentation:     Evaluation  O2 sats: stable throughout Complications: No apparent complications Patient did tolerate procedure well. Bilateral Breath Sounds: Rhonchi;Diminished Suctioning: Airway Yes Vocalized post extubation I.S- 1.0L  [name] 07/25/2013, 10:47 AM

## 2013-07-25 NOTE — Progress Notes (Signed)
Patient ID: [name], male   DOB: [birth year], 68 y.o.   MRN: [id number]   SUBJECTIVE: Extubated today.  Denies chest pain/dypspnea at rest.  CVP 9-10.   Scheduled Meds: . amLODipine  10 mg Oral Daily  . antiseptic oral rinse  15 mL Mouth Rinse QID  . aspirin  81 mg Oral Daily  . atorvastatin  40 mg Oral q1800  . chlorhexidine  15 mL Mouth Rinse BID  . furosemide  40 mg Intravenous BID  . ipratropium-albuterol  3 mL Nebulization Q6H  . metoprolol tartrate  50 mg Oral BID  . potassium chloride  40 mEq Oral Once  . Warfarin - Pharmacist Dosing Inpatient   Does not apply q1800   Continuous Infusions:  PRN Meds:.sodium chloride, albuterol, fentaNYL, hydrALAZINE    Filed Vitals:   07/25/13 0800 07/25/13 0809 07/25/13 0900 07/25/13 1000  BP: 165/51  130/57 145/46  Pulse: 85  90 83  Temp:  100.6 F (38.1 C)    TempSrc:  Oral    Resp: 23  26 22   Height:      Weight:      SpO2: 96%  96% 95%    Intake/Output Summary (Last 24 hours) at 07/25/13 1135 Last data filed at 07/25/13 1000  Gross per 24 hour  Intake 1426.71 ml  Output   2025 ml  Net -598.29 ml    LABS: Basic Metabolic Panel:  Recent Labs  07/24/13 0300 07/25/13 0415  NA 135* 138  K 4.5 3.9  CL 95* 98  CO2 25 28  GLUCOSE 162* 158*  BUN 32* 25*  CREATININE 1.21 0.85  CALCIUM 8.5 8.7  MG 1.8  --    Liver Function Tests:  Recent Labs  07/23/13 1008 07/24/13 0300  AST 52* 52*  ALT 30 26  ALKPHOS 142* 103  BILITOT 0.9 0.5  PROT 7.4 6.0  ALBUMIN 3.6 2.9*   No results found for this basename: LIPASE, AMYLASE,  in the last 72 hours CBC:  Recent Labs  07/23/13 1008 07/24/13 0300 07/25/13 0415  WBC 7.8 12.7* 9.2  NEUTROABS 6.5  --   --   HGB 12.3* 10.3* 11.0*  HCT 35.8* 30.1* 32.7*  MCV 102.0* 97.7 100.3*  PLT 325 218 208   Cardiac Enzymes:  Recent Labs  07/23/13 1852 07/24/13 0052 07/24/13 0800  TROPONINI 4.35* 3.22* 3.06*   BNP: No components found with this basename: POCBNP,    D-Dimer: No results found for this basename: DDIMER,  in the last 72 hours Hemoglobin A1C: No results found for this basename: HGBA1C,  in the last 72 hours Fasting Lipid Panel: No results found for this basename: CHOL, HDL, LDLCALC, TRIG, CHOLHDL, LDLDIRECT,  in the last 72 hours Thyroid Function Tests: No results found for this basename: TSH, T4TOTAL, FREET3, T3FREE, THYROIDAB,  in the last 72 hours Anemia Panel: No results found for this basename: VITAMINB12, FOLATE, FERRITIN, TIBC, IRON, RETICCTPCT,  in the last 72 hours  RADIOLOGY: Dg Chest Port 1 View  07/24/2013   CLINICAL DATA:  Ventilator  EXAM: PORTABLE CHEST - 1 VIEW  COMPARISON:  07/24/2013  FINDINGS: Endotracheal tube remains in good position. NG tube tip not well seen but may be in the distal esophagus. Left jugular catheter tip in the SVC in unchanged. No pneumothorax  Diffuse bilateral airspace disease shows mild interval improvement. Possible edema.  IMPRESSION: Mild improvement in diffuse bilateral airspace disease consistent with edema.   Electronically Signed   By: Juanda Crumble  Carlis Abbott M.D.   On: 07/24/2013 14:13   Dg Chest Port 1 View  07/24/2013   CLINICAL DATA:  Acute respiratory failure. On ventilator. Congestive heart failure. Coronary artery disease.  EXAM: PORTABLE CHEST - 1 VIEW  COMPARISON:  07/23/2013  FINDINGS: Support lines and tubes in appropriate position. Diffuse bilateral airspace disease and probable layering bilateral pleural effusion show no significant change. Cardiomegaly is stable.  IMPRESSION: No significant change compared with prior exam.   Electronically Signed   By: Earle Gell M.D.   On: 07/24/2013 07:17   Dg Chest Port 1 View  07/23/2013   CLINICAL DATA:  Central line placement.  EXAM: PORTABLE CHEST - 1 VIEW  COMPARISON:  Chest x-ray 08/25/2013.  FINDINGS: An endotracheal tube is in place with tip 4.8 cm above the carina. A nasogastric tube is seen extending into the stomach, however, the tip of the  nasogastric tube extends below the lower margin of the image. There is a left-sided internal jugular central venous catheter with tip terminating in the proximal superior vena cava. Lung volumes are slightly low. There continues to be widespread interstitial and airspace disease throughout the lungs bilaterally, with some cephalization of the pulmonary vasculature, most compatible with moderate pulmonary edema. Overall, this is slightly improved compared to yesterday's examination. Probable trace bilateral pleural effusions. Heart size remains mildly enlarged. The patient is rotated to the right on today's exam, resulting in distortion of the mediastinal contours and reduced diagnostic sensitivity and specificity for mediastinal pathology. Atherosclerosis in the thoracic aorta. Status post median sternotomy for CABG and mechanical mitral and aortic valve replacements.  IMPRESSION: 1. Overall, there appears to be slightly improved aeration compared to yesterday's examination, however, there is still evidence for moderate congestive heart failure. 2. Support apparatus and postoperative changes, as above.   Electronically Signed   By: Vinnie Langton M.D.   On: 07/23/2013 14:22   Dg Chest Portable 1 View  07/23/2013   CLINICAL DATA:  Endotracheal tube placement  EXAM: PORTABLE CHEST - 1 VIEW  COMPARISON:  05/29/2013  FINDINGS: Moderate cardiac enlargement. Status post CABG and replacement cardiac valve placement. Endotracheal tube is seen with tip 3.9 cm above the carina. There is vascular congestion in. There is moderate to severe interstitial prominence with more confluent alveolar opacities in the mid lung zones, right worse than left.  IMPRESSION: Endotracheal tube as described. Congestive heart failure with moderate to severe pulmonary edema.   Electronically Signed   By: Skipper Cliche M.D.   On: 07/23/2013 10:10    PHYSICAL EXAM General: NAD Neck: JVP 8-9 cm, no thyromegaly or thyroid nodule.  Lungs:  Crackles at bases bilaterally CV: Nondisplaced PMI.  Heart regular S1/S2, mechanical S1/S2, 2/6 SEM.  No peripheral edema.   Abdomen: Soft, nontender, no hepatosplenomegaly, no distention.  Neurologic: Sleepy but awakens and converses  Psych: Normal affect. Extremities: No clubbing or cyanosis.   TELEMETRY: Reviewed telemetry pt in NSR  ASSESSMENT AND PLAN: 68 yo with history of CAD s/p CABG, mechanical MV and AoV, severe COPD, and diastolic CHF had PEA arrest likely due to hypoxia in setting of acute/chronic diastolic CHF and baseline severe COPD. Extubated today.  1. Acute on chronic diastolic CHF: Has had considerable noncompliance with low sodium diet. He has been diuresed and extubated. CVP 9-10 today.  Echo with EF 50-55%, consistent with prior.  - Lasix 40 mg IV bid today, replete K.  2. Mechanical aortic and mitral valves: Appear to be functioning normally on  echo.  Continue warfarin goal INR 2.5-3.5 and ASA 81.  3. COPD: Stable.  4. CAD: Prior CABG.  Elevated troponin this admission but mild.  Suspect primary event was acute on chronic diastolic CHF with hypoxia leading to PEA arrest.  No chest pain.  No invasive evaluation at this time.  Continue statin, ASA 81.  Would consider Cardiolite to evaluate for ischemia after discharge.   Loralie Champagne 07/25/2013 11:41 AM

## 2013-07-26 ENCOUNTER — Inpatient Hospital Stay (HOSPITAL_COMMUNITY): Payer: Medicare Other

## 2013-07-26 LAB — BASIC METABOLIC PANEL
BUN: 26 mg/dL — ABNORMAL HIGH (ref 6–23)
BUN: 32 mg/dL — ABNORMAL HIGH (ref 6–23)
CALCIUM: 8.9 mg/dL (ref 8.4–10.5)
CALCIUM: 8.7 mg/dL (ref 8.4–10.5)
CO2: 27 mEq/L (ref 19–32)
CO2: 29 meq/L (ref 19–32)
CREATININE: 0.97 mg/dL (ref 0.50–1.35)
CREATININE: 1.14 mg/dL (ref 0.50–1.35)
Chloride: 99 mEq/L (ref 96–112)
Chloride: 97 mEq/L (ref 96–112)
GFR calc Af Amer: >90 mL/min (ref >90)
GFR calc non Af Amer: 83 mL/min — ABNORMAL LOW (ref >90)
GFR calc non Af Amer: 64 mL/min — ABNORMAL LOW (ref >90)
GFR, EST AFRICAN AMERICAN: 74 mL/min — AB (ref >90)
Glucose, Bld: 103 mg/dL — ABNORMAL HIGH (ref 70–99)
Glucose, Bld: 132 mg/dL — ABNORMAL HIGH (ref 70–99)
Potassium: 4 mEq/L (ref 3.7–5.3)
Potassium: 4.1 mEq/L (ref 3.7–5.3)
Sodium: 140 mEq/L (ref 137–147)
Sodium: 137 mEq/L (ref 137–147)

## 2013-07-26 LAB — CBC
HCT: 29.7 % — ABNORMAL LOW (ref 39.0–52.0)
Hemoglobin: 9.8 g/dL — ABNORMAL LOW (ref 13.0–17.0)
MCH: 33.4 pg (ref 26.0–34.0)
MCHC: 33 g/dL (ref 30.0–36.0)
MCV: 101.4 fL — ABNORMAL HIGH (ref 78.0–100.0)
PLATELETS: 232 10*3/uL (ref 150–400)
RBC: 2.93 MIL/uL — ABNORMAL LOW (ref 4.22–5.81)
RDW: 14.6 % (ref 11.5–15.5)
WBC: 7.5 10*3/uL (ref 4.0–10.5)

## 2013-07-26 LAB — PROTIME-INR
INR: 2.45 — AB (ref 0.00–1.49)
INR: 2.57 — ABNORMAL HIGH (ref 0.00–1.49)
Prothrombin Time: 25.8 seconds — ABNORMAL HIGH (ref 11.6–15.2)
Prothrombin Time: 26.7 seconds — ABNORMAL HIGH (ref 11.6–15.2)

## 2013-07-26 LAB — GLUCOSE, CAPILLARY
GLUCOSE-CAPILLARY: 98 mg/dL (ref 70–99)
GLUCOSE-CAPILLARY: 94 mg/dL (ref 70–99)
GLUCOSE-CAPILLARY: 104 mg/dL — AB (ref 70–99)
Glucose-Capillary: 128 mg/dL — ABNORMAL HIGH (ref 70–99)
Glucose-Capillary: 184 mg/dL — ABNORMAL HIGH (ref 70–99)
Glucose-Capillary: 82 mg/dL (ref 70–99)

## 2013-07-26 MED ORDER — SODIUM CHLORIDE 0.9 % IV SOLN
250.0000 mL | INTRAVENOUS | Status: DC
  Administered 2013-07-26: 250 mL via INTRAVENOUS

## 2013-07-26 MED ORDER — METOPROLOL TARTRATE 100 MG PO TABS
100.0000 mg | ORAL_TABLET | Freq: Two times a day (BID) | ORAL | Status: DC
  Administered 2013-07-26 – 2013-07-30 (×8): 100 mg via ORAL
  Filled 2013-07-26 (×9): qty 1

## 2013-07-26 MED ORDER — SODIUM CHLORIDE 0.9 % IJ SOLN
3.0000 mL | Freq: Two times a day (BID) | INTRAMUSCULAR | Status: DC
  Administered 2013-07-26 – 2013-07-30 (×8): 3 mL via INTRAVENOUS

## 2013-07-26 MED ORDER — METOPROLOL TARTRATE 1 MG/ML IV SOLN
5.0000 mg | INTRAVENOUS | Status: DC | PRN
  Administered 2013-07-26 – 2013-07-27 (×3): 5 mg via INTRAVENOUS
  Filled 2013-07-26 (×3): qty 5

## 2013-07-26 MED ORDER — SODIUM CHLORIDE 0.9 % IJ SOLN
3.0000 mL | INTRAMUSCULAR | Status: DC | PRN
  Administered 2013-07-27: 3 mL via INTRAVENOUS

## 2013-07-26 MED ORDER — WARFARIN SODIUM 7.5 MG PO TABS
7.5000 mg | ORAL_TABLET | Freq: Once | ORAL | Status: AC
  Administered 2013-07-26: 7.5 mg via ORAL
  Filled 2013-07-26: qty 1

## 2013-07-26 MED ORDER — SODIUM CHLORIDE 0.9 % IV SOLN
INTRAVENOUS | Status: DC
  Administered 2013-07-27: 06:00:00 via INTRAVENOUS

## 2013-07-26 NOTE — Progress Notes (Signed)
Name: [name] MRN: [id number] DOB: 1946/01/12    ADMISSION DATE:  07/23/2013  REFERRING MD :  Forestine Na EDP  PRIMARY SERVICE: PCCM  CHIEF COMPLAINT:  Respiratory failure   BRIEF PATIENT DESCRIPTION: 68 yo male with hx COPD, CHF, CAD, St Jude valve replacement on coumadin presented 1/18 to [facility] ER with severe respiratory distress requiring intubation.  Brief cardiac arrest prior to intubation. Tx to [facility].    SIGNIFICANT EVENTS / STUDIES:  1/18 Pt presented to [facility] hypoxic and intubated with brief arrest during intubation. ProBNP elevated and fluid overloaded.   LINES / TUBES: ETT 1/18>>>1/20 OGT 1/18 >>>1/20 Foley 1/18 >>>1/21 L IJ 1/18 >>>1/21  CULTURES: BC x 2 1/18>>>1/2 bottle prelim positive with gram positive clusters Urine 1/18>>> Sputum 1/18>>> [facility] 1/21 >>>  ANTIBIOTICS: None  Subjective: Patient did well with extubation yesterday and no problems overnight. No pain, no SOB. Blood culture with 1/2 bottle positive with gram positive in clusters but no antibiotics started. Repeated blood cultures. No fevers.   VITAL SIGNS: Temp:  [97.9 F (36.6 C)-100.6 F (38.1 C)] 98.2 F (36.8 C) (01/21 0746) Pulse Rate:  [72-101] 87 (01/21 0700) Resp:  [13-28] 13 (01/21 0700) BP: (108-165)/(44-131) 153/75 mmHg (01/21 0700) SpO2:  [86 %-96 %] 94 % (01/21 0700) Arterial Line BP: (135-186)/(47-58) 135/51 mmHg (01/20 1500) FiO2 (%):  [40 %] 40 % (01/20 0854) Weight:  [147 lb 7.8 oz (66.9 kg)] 147 lb 7.8 oz (66.9 kg) (01/21 0500) HEMODYNAMICS: CVP:  [3 mmHg-10 mmHg] 9 mmHg VENTILATOR SETTINGS: Vent Mode:  [-] PSV;CPAP FiO2 (%):  [40 %] 40 % PEEP:  [5 cmH20] 5 cmH20 Pressure Support:  [5 cmH20] 5 cmH20 INTAKE / OUTPUT: Intake/Output     01/20 0701 - 01/21 0700 01/21 0701 - 01/22 0700   P.O. 360    I.V. (mL/kg) 170.9 (2.6)    NG/GT 240    IV Piggyback     Total Intake(mL/kg) 770.9 (11.5)    Urine (mL/kg/hr) 1560 (1)    Total Output 1560     Net -789.1           Stool Occurrence 1 x     PHYSICAL EXAMINATION: General:  Awake and pleasant, no distress Neuro:  Alert and oriented times 2, not to year HEENT:  PERRLA Cardiovascular:  S1 S2 normal, HV click noted, regular rate Lungs:  Lung sounds improved with minimal rales at bases Abdomen:  +BS, non-tender, non-distended Musculoskeletal:  Moves all 4 extremities Skin:  No active  LABS:  CBC  Recent Labs Lab 07/24/13 0300 07/25/13 0415 07/26/13 0519  WBC 12.7* 9.2 7.5  HGB 10.3* 11.0* 9.8*  HCT 30.1* 32.7* 29.7*  PLT 218 208 232   Coag's  Recent Labs Lab 07/24/13 0300 07/25/13 0415 07/26/13 0519  INR 4.69* 4.55* 2.45*   BMET  Recent Labs Lab 07/24/13 0300 07/25/13 0415 07/26/13 0519  NA 135* 138 140  K 4.5 3.9 4.0  CL 95* 98 99  CO2 25 28 27   BUN 32* 25* 26*  CREATININE 1.21 0.85 0.97  GLUCOSE 162* 158* 103*   Electrolytes  Recent Labs Lab 07/24/13 0300 07/25/13 0415 07/26/13 0519  CALCIUM 8.5 8.7 8.9  MG 1.8  --   --    Sepsis Markers  Recent Labs Lab 07/23/13 1009 07/23/13 2050 07/24/13 0052  LATICACIDVEN 5.2* 1.4 1.5   ABG  Recent Labs Lab 07/23/13 1120 07/23/13 1450  PHART 7.197* 7.272*  PCO2ART 62.0* 57.2*  PO2ART 80.4 70.0*   Liver Enzymes  Recent Labs Lab 07/23/13 1008 07/24/13 0300  AST 52* 52*  ALT 30 26  ALKPHOS 142* 103  BILITOT 0.9 0.5  ALBUMIN 3.6 2.9*   Cardiac Enzymes  Recent Labs Lab 07/23/13 1008  07/23/13 1852 07/24/13 0052 07/24/13 0800  TROPONINI <0.30  < > 4.35* 3.22* 3.06*  PROBNP 8081.0*  --   --   --   --   < > = values in this interval not displayed. Glucose  Recent Labs Lab 07/25/13 0742 07/25/13 1145 07/25/13 1535 07/25/13 1906 07/25/13 2340 07/26/13 0406  GLUCAP 136* 120* 117* 112* 82 98    Imaging Dg Chest Port 1 View  07/26/2013   CLINICAL DATA:  Acute respiratory failure  EXAM: PORTABLE CHEST - 1 VIEW  COMPARISON:  July 24, 2013  FINDINGS: Endotracheal tube and  nasogastric tube have been removed. The left jugular catheter tip is in the left innominate vein near the junction with the superior vena cava. No pneumothorax. There is increase in interstitial and patchy alveolar edema compared to 2 days prior. There is cardiomegaly with small effusions. There are prosthetic mitral and aortic valves. No adenopathy.  IMPRESSION: Congestive heart failure with increased edema.  No pneumothorax.   Electronically Signed   By: Lowella Grip M.D.   On: 07/26/2013 07:33   Dg Chest Port 1 View  07/24/2013   CLINICAL DATA:  Ventilator  EXAM: PORTABLE CHEST - 1 VIEW  COMPARISON:  07/24/2013  FINDINGS: Endotracheal tube remains in good position. NG tube tip not well seen but may be in the distal esophagus. Left jugular catheter tip in the SVC in unchanged. No pneumothorax  Diffuse bilateral airspace disease shows mild interval improvement. Possible edema.  IMPRESSION: Mild improvement in diffuse bilateral airspace disease consistent with edema.   Electronically Signed   By: Franchot Gallo M.D.   On: 07/24/2013 14:13   CXR: no new  ASSESSMENT / PLAN:  PULMONARY Acute respiratory failure - Decompensated CHFwith COPD  COPD Pulmonary Edema   P:   Stable for stepdown today Further diuresis per cardiology Albuterol/Atrovent nebs q 6 h  CARDIOVASCULAR Acute on chronic diastolic CHF -- EF 23% (7/62/8315), dry weight unclear but lowest recently is 155. CAD - s/p CABG/MVR/AVR  Chronic anticoagulation  Elevated troponin - Likely secondary to hypoxia, down trending.  P:  ASA daily Metoprolol 50 q 12 IV metoprolol 5 mg prn q 5 min for HR > 115 Coumadin per pharmacy goal 2.5-3.5  D/C central line D/C foley as no further diuresis Patient out of diuretic meds and needs to be addressed prior to D/C (pharmacy unable to get in torsemide and he could not fill)  RENAL Hyponatremia -chronic  P:   Trend BMP, Mag  GASTROINTESTINAL A:  GI Px not indicated Nutrition P:     Diet heart   HEMATOLOGIC A:  Cardiogenic shock - Resolved Hypothermia - Resolved P:  Monitor  INFECTIOUS A:  No active, U/A unremarkable, blood and respiratory cultures pending. Likely 1/2 was contaminant but will follow for speciation P:   Monitor off Abx, follow blood cultures   ENDOCRINE A:  No active P:   Monitor   NEUROLOGIC A:  No active P:   Monitor   Vertell Novak, PGY-3 07/26/2013  7:48 AM  PCCM ATTENDING: I have interviewed and examined the patient and reviewed the database. I have formulated the assessment and plan as reflected in the note above with amendments made by me.  Discussed with Dr Caryl Comes. He plans [facility] 1/22. Will leave in ICU until after that.  Merton Border, MD;  PCCM service; Mobile [phone]

## 2013-07-26 NOTE — Progress Notes (Signed)
Brief Interval Progress Note  Received notification that 1/2 blood cultures collected 1/19 returned with gram positive cocci in clusters.  Per chart review, 2/2 blood cultures collected 1/18 returned no growth.  The patient had a temperature of 100.6 this morning, but is currently afebrile, with no leukocytosis on this morning's labs.  I examined the patient's L IJ central line and peripheral IV's, which showed no evidence of erythema (just some clotted blood surrounding central line).  CXR 1/19 without infiltrate.  Urine culture 1/18 showed no growth.  Respiratory culture 1/18 showed few gram positive cocci and rods.  I will repeat blood cultures at this time, and repeat CXR to search for any evidence of infection.  Given stable BP, lack of fever, and lack of obvious source of infection, I will not start antibiotic treatment at this time.  Signed, Elnora Morrison, PGY3 Pgr. [phone] 07/26/2013, 1:49 AM

## 2013-07-26 NOTE — Progress Notes (Signed)
Patient Name: [name]      SUBJECTIVE:*admitted with respiratory failure cx by PEA arrest and intubation with shock  Extubated yesterday and course last pm notable for sinus with freq runs of atrial tachycardia>> atrial fibrillation  He is unaware of the diagnosis being made previously,  although he has had some irregularity interms of valve clicks.  The chart refers to afib in 7/14 but i see NO ECGs to support the diagnosis  history of CAD s/p CABG, mechanical MV and AoV, severe COPD, and diastolic CHF  Past Medical History  Diagnosis Date  . Hypertension   . CAD (coronary artery disease) 2003    a. CABG/MVR/AVR-2003 (#25 St. Jude/#21 St. Jude); anticoagulation; b. negative stress nuclear-2005;  c. 01/2013 NSTEMI/Cath/PCI: LM nl, LAD 50p, 40-3m, D1 50ost, LCX 81m, RCA 50/49m (2.5x20 Promus DES), 50d, VG->Diag 100, VG->PDA 100, VG->OM 100, LIMA->LAD ok.  . Epistaxis     requiring cautery & aterial ligation-09/2009  . GERD (gastroesophageal reflux disease)   . Hyperlipemia   . Abnormal LFTs     possible cirrhosis  . Chronic anticoagulation   . Valvular heart disease     a. 2003: MVR/AVR-2003 (#25 St. Jude/#21 St. Jude);  b. 01/2013 Echo: EF 55%, Mech AVR mean grad 12, Mech MVR mean grad 6.  . Tobacco abuse     45 pack years  . Fasting hyperglycemia   . Nephrolithiasis   . Diverticulosis   . Hyponatremia   . Chronic diastolic CHF (congestive heart failure)   . Chronic renal disease   . Hemolytic anemia     history of  . ETOH abuse   . A-fib 01/16/2013    a. on coumadin  . COPD (chronic obstructive pulmonary disease)     CONTROLED    Scheduled Meds:  Scheduled Meds: . amLODipine  10 mg Oral Daily  . antiseptic oral rinse  15 mL Mouth Rinse QID  . aspirin  81 mg Oral Daily  . atorvastatin  40 mg Oral q1800  . chlorhexidine  15 mL Mouth Rinse BID  . furosemide  40 mg Intravenous BID  . ipratropium-albuterol  3 mL Nebulization Q6H  . metoprolol tartrate   50 mg Oral BID  . warfarin  7.5 mg Oral ONCE-1800  . Warfarin - Pharmacist Dosing Inpatient   Does not apply q1800   Continuous Infusions:   PHYSICAL EXAM Filed Vitals:   07/26/13 1000 07/26/13 1100 07/26/13 1142 07/26/13 1200  BP: 93/50 129/52  114/63  Pulse: 133 141  85  Temp:   98.4 F (36.9 C)   TempSrc: Oral Oral Oral Oral  Resp: 19 17  13   Height:      Weight:      SpO2: 92% 93%  95%   Well developed and nourished in no acute distress HENT normal Neck supple  Clear wheezes and ronchi Irregularly irregular with mechanical s1 and s2 Abd-soft with active BS No Clubbing cyanosis edema Skin-warm and dry A & Oriented  Grossly normal sensory and motor function  TELEMETRY: Reviewed telemetry pt in nsr>>atrial tach>>Afib    Intake/Output Summary (Last 24 hours) at 07/26/13 1326 Last data filed at 07/26/13 1200  Gross per 24 hour  Intake    280 ml  Output   1545 ml  Net  -1265 ml    LABS: Basic Metabolic Panel:  Recent Labs Lab 07/23/13 1008 07/24/13 0300 07/25/13 0415 07/26/13 0519  NA 135* 135* 138 140  K  4.4 4.5 3.9 4.0  CL 95* 95* 98 99  CO2 20 25 28 27   GLUCOSE 106* 162* 158* 103*  BUN 30* 32* 25* 26*  CREATININE 1.16 1.21 0.85 0.97  CALCIUM 8.7 8.5 8.7 8.9  MG  --  1.8  --   --    Cardiac Enzymes:  Recent Labs  07/23/13 1852 07/24/13 0052 07/24/13 0800  TROPONINI 4.35* 3.22* 3.06*   CBC:  Recent Labs Lab 07/23/13 1008 07/24/13 0300 07/25/13 0415 07/26/13 0519  WBC 7.8 12.7* 9.2 7.5  NEUTROABS 6.5  --   --   --   HGB 12.3* 10.3* 11.0* 9.8*  HCT 35.8* 30.1* 32.7* 29.7*  MCV 102.0* 97.7 100.3* 101.4*  PLT 325 218 208 232   PROTIME:  Recent Labs  07/24/13 0300 07/25/13 0415 07/26/13 0519  LABPROT 42.3* 41.3* 25.8*  INR 4.69* 4.55* 2.45*   Liver Function Tests:  Recent Labs  07/24/13 0300  AST 52*  ALT 26  ALKPHOS 103  BILITOT 0.5  PROT 6.0  ALBUMIN 2.9*   No results found for this basename: LIPASE, AMYLASE,  in  the last 72 hours BNP: BNP (last 3 results)  Recent Labs  01/27/13 0705 05/29/13 0826 07/23/13 1008  PROBNP 5719.0* 6106.0* 8081.0*   D-Dimer: No results found for this basename: DDIMER,  in the last 72 hours Hemoglobin A1C: No results found for this basename: HGBA1C,  in the last 72 hours Fasting Lipid Panel: No results found for this basename: CHOL, HDL, LDLCALC, TRIG, CHOLHDL, LDLDIRECT,  in the last 72 hours Thyroid Function Tests: No results found for this basename: TSH, T4TOTAL, FREET3, T3FREE, THYROIDAB,  in the last 72 hours Anemia Panel:   ASSESSMENT AND PLAN:  Principal Problem:   Acute respiratory failure with hypoxia Active Problems:   Arteriosclerotic cardiovascular disease (ASCVD)   Acute on chronic diastolic CHF (congestive heart failure), NYHA class 4   CHF (congestive heart failure)  Atrial fibrillation  Would consider cardioversion perhaps in the am to faciliated recovery with hx of HFpEF  He is anticoagulated  fi recurrent afib is an issue then would consider the use of antiarrhtymic drug therapy  Signed, Virl Axe MD  07/26/2013

## 2013-07-26 NOTE — Progress Notes (Signed)
ANTICOAGULATION CONSULT NOTE - Follow Up Consult  Pharmacy Consult for Coumadin  Indication: Afib, AVR, MVR   No Known Allergies  Patient Measurements: Height: 5\' 7"  (170.2 cm) Weight: 147 lb 7.8 oz (66.9 kg) IBW/kg (Calculated) : 66.1  Vital Signs: Temp: 98.2 F (36.8 C) (01/21 0746) Temp src: Oral (01/21 0746) BP: 153/75 mmHg (01/21 0700) Pulse Rate: 87 (01/21 0700)  Labs:  Recent Labs  07/23/13 1852 07/24/13 0052 07/24/13 0300 07/24/13 0800 07/25/13 0415 07/26/13 0519  HGB  --   --  10.3*  --  11.0* 9.8*  HCT  --   --  30.1*  --  32.7* 29.7*  PLT  --   --  218  --  208 232  LABPROT  --   --  42.3*  --  41.3* 25.8*  INR  --   --  4.69*  --  4.55* 2.45*  CREATININE  --   --  1.21  --  0.85 0.97  TROPONINI 4.35* 3.22*  --  3.06*  --   --     Estimated Creatinine Clearance: 68.1 ml/min (by C-G formula based on Cr of 0.97).   Assessment: 68 year old male on chronic Coumadin therapy for Afib, MVR, AVR admitted on 1/18 for respiratory distress and brief cardiac arrest. Patient's Coumadin dose at home is 7.5 mg MWF, and 5 mg all other days. Per wife, last dose taken 1/17 evening. Doses held 1/18 to 1/20 for elevated INRs, and today's INR is subtherapeutic at 2.45 with large decrease in past 24 hours. Patient continues to have no signs of bleeding at this time, but hemoglobin decreased and we will follow.   Goal of Therapy:  INR 2.5-3.5 Monitor platelets by anticoagulation protocol: Yes   Plan:  1. Coumadin 7.5 mg PO X 1 tonight  2. Monitor daily INR, CBC, and signs of bleeding   Baker,Katherine PharmD Candidate  07/26/2013,9:02 AM  I agree with above.  Sherlon Handing, PharmD, BCPS Clinical pharmacist, pager [phone] 07/26/2013  12:01 PM

## 2013-07-27 LAB — BASIC METABOLIC PANEL
BUN: 27 mg/dL — AB (ref 6–23)
CO2: 27 mEq/L (ref 19–32)
Calcium: 9 mg/dL (ref 8.4–10.5)
Chloride: 98 mEq/L (ref 96–112)
Creatinine, Ser: 0.95 mg/dL (ref 0.50–1.35)
GFR calc Af Amer: >90 mL/min (ref >90)
GFR calc non Af Amer: 84 mL/min — ABNORMAL LOW (ref >90)
Glucose, Bld: 91 mg/dL (ref 70–99)
POTASSIUM: 3.9 meq/L (ref 3.7–5.3)
Sodium: 138 mEq/L (ref 137–147)

## 2013-07-27 LAB — CULTURE, RESPIRATORY

## 2013-07-27 LAB — CULTURE, BLOOD (ROUTINE X 2)

## 2013-07-27 LAB — GLUCOSE, CAPILLARY
Glucose-Capillary: 103 mg/dL — ABNORMAL HIGH (ref 70–99)
Glucose-Capillary: 161 mg/dL — ABNORMAL HIGH (ref 70–99)
Glucose-Capillary: 94 mg/dL (ref 70–99)
Glucose-Capillary: 98 mg/dL (ref 70–99)

## 2013-07-27 LAB — PROTIME-INR
INR: 2.22 — ABNORMAL HIGH (ref 0.00–1.49)
Prothrombin Time: 23.9 seconds — ABNORMAL HIGH (ref 11.6–15.2)

## 2013-07-27 LAB — CULTURE, RESPIRATORY W GRAM STAIN

## 2013-07-27 MED ORDER — WARFARIN SODIUM 7.5 MG PO TABS
7.5000 mg | ORAL_TABLET | Freq: Once | ORAL | Status: AC
  Administered 2013-07-27: 7.5 mg via ORAL
  Filled 2013-07-27: qty 1

## 2013-07-27 MED ORDER — FENTANYL CITRATE 0.05 MG/ML IJ SOLN
75.0000 ug | Freq: Once | INTRAMUSCULAR | Status: AC
  Administered 2013-07-27: 75 ug via INTRAVENOUS

## 2013-07-27 MED ORDER — LORAZEPAM 2 MG/ML IJ SOLN
1.0000 mg | Freq: Once | INTRAMUSCULAR | Status: AC
  Administered 2013-07-27: 1 mg via INTRAVENOUS
  Filled 2013-07-27: qty 1

## 2013-07-27 MED ORDER — POTASSIUM CHLORIDE 20 MEQ/15ML (10%) PO LIQD
20.0000 meq | Freq: Once | ORAL | Status: AC
  Administered 2013-07-27: 20 meq via ORAL
  Filled 2013-07-27: qty 15

## 2013-07-27 MED ORDER — MIDAZOLAM HCL 2 MG/2ML IJ SOLN
4.0000 mg | Freq: Once | INTRAMUSCULAR | Status: AC
  Administered 2013-07-27: 4 mg via INTRAVENOUS

## 2013-07-27 MED ORDER — POTASSIUM CHLORIDE CRYS ER 20 MEQ PO TBCR
20.0000 meq | EXTENDED_RELEASE_TABLET | Freq: Once | ORAL | Status: DC
  Filled 2013-07-27: qty 1

## 2013-07-27 MED ORDER — TROPICAMIDE 1 % OP SOLN
1.0000 [drp] | OPHTHALMIC | Status: DC | PRN
Start: 2013-07-27 — End: 2013-07-30
  Filled 2013-07-27: qty 2

## 2013-07-27 MED ORDER — FUROSEMIDE 40 MG PO TABS
40.0000 mg | ORAL_TABLET | Freq: Three times a day (TID) | ORAL | Status: DC
  Administered 2013-07-27 – 2013-07-30 (×8): 40 mg via ORAL
  Filled 2013-07-27 (×12): qty 1

## 2013-07-27 MED ORDER — BIOTENE DRY MOUTH MT LIQD
15.0000 mL | Freq: Two times a day (BID) | OROMUCOSAL | Status: DC
  Administered 2013-07-27 – 2013-07-30 (×6): 15 mL via OROMUCOSAL

## 2013-07-27 MED ORDER — MIDAZOLAM HCL 2 MG/2ML IJ SOLN
INTRAMUSCULAR | Status: AC
  Administered 2013-07-27: 4 mg via INTRAVENOUS
  Filled 2013-07-27: qty 4

## 2013-07-27 MED ORDER — FUROSEMIDE 10 MG/ML IJ SOLN
40.0000 mg | Freq: Three times a day (TID) | INTRAMUSCULAR | Status: DC
  Administered 2013-07-27: 40 mg via INTRAVENOUS

## 2013-07-27 MED ORDER — FENTANYL CITRATE 0.05 MG/ML IJ SOLN
INTRAMUSCULAR | Status: AC
  Administered 2013-07-27: 75 ug via INTRAVENOUS
  Filled 2013-07-27: qty 4

## 2013-07-27 NOTE — Progress Notes (Signed)
Phlebotomy (Debbie) called for 0500 labs to be drawn. "Will send someone"

## 2013-07-27 NOTE — CV Procedure (Signed)
Procedure: Electrical Cardioversion Indications:  Atrial Fibrillation  Procedure Details:  Consent: Risks of procedure as well as the alternatives and risks of each were explained to the (patient/caregiver).  Consent for procedure obtained.  Time Out: Verified patient identification, verified procedure, site/side was marked, verified correct patient position, special equipment/implants available, medications/allergies/relevent history reviewed, required imaging and test results available.  Performed  Patient placed on cardiac monitor, pulse oximetry, supplemental oxygen as necessary.  Sedation given: Versed 4 mg, Fentanyl 75 mcg IV Pacer pads placed anterior and posterior chest.  Cardioverted 1 time(s).  Cardioverted at 120J. Synchronized biphasic  Evaluation: Findings: Post procedure EKG shows: NSR with PACs Complications: None Patient did tolerate procedure well.  Time Spent Directly with the Patient:  30 minutes   Shalynn Jorstad 07/27/2013, 3:03 PM

## 2013-07-27 NOTE — Progress Notes (Signed)
ANTICOAGULATION CONSULT NOTE - Follow Up Consult  Pharmacy Consult for Coumadin  Indication: Afib, AVR, MVR   No Known Allergies  Patient Measurements: Height: 5\' 7"  (170.2 cm) Weight: 148 lb 2.4 oz (67.2 kg) IBW/kg (Calculated) : 66.1  Vital Signs: Temp: 98.1 F (36.7 C) (01/22 0800) Temp src: Oral (01/22 0757) BP: 116/66 mmHg (01/22 0600) Pulse Rate: 99 (01/22 0600)  Labs:  Recent Labs  07/25/13 0415 07/26/13 0519 07/26/13 1530 07/27/13 0735  HGB 11.0* 9.8*  --   --   HCT 32.7* 29.7*  --   --   PLT 208 232  --   --   LABPROT 41.3* 25.8* 26.7* 23.9*  INR 4.55* 2.45* 2.57* 2.22*  CREATININE 0.85 0.97 1.14  --     Estimated Creatinine Clearance: 58 ml/min (by C-G formula based on Cr of 1.14).   Assessment: 68 year old male on chronic Coumadin therapy for Afib, MVR, AVR admitted on 1/18 for respiratory distress and brief cardiac arrest. Patient's Coumadin dose at home is 7.5 mg MWF, and 5 mg all other days. Per wife, last dose taken 1/17 evening. Doses held 1/18 to 1/20 for elevated INRs. Today's INR continues to be subtherapeutic at 2.22 with a slight decrease from 2.45 in the past 24 hours. Patient continues to have no signs of bleeding at this time.  Goal of Therapy:  INR 2.5-3.5 Monitor platelets by anticoagulation protocol: Yes   Plan:  1. Continue Coumadin 7.5 mg PO X 1 tonight  2. Monitor daily INR, CBC, and signs of bleeding   Baker,Katherine PharmD Candidate  07/27/2013,8:25 AM  Pharmaceutical care plan reviewed in detail.  Agree with above assessment and plan.   Thank you for allowing pharmacy to be a part of this patients care team.  [name] Pharm.D., BCPS Clinical Pharmacist 07/27/2013 11:59 AM Pager: [phone] Phone: [phone]

## 2013-07-27 NOTE — Progress Notes (Signed)
Paged Cardiology and spoke with Trish. Planned cardioversion after MD finishes in Endo approx 1430-1445. CCM doc made aware for airway monitoring.

## 2013-07-27 NOTE — Progress Notes (Signed)
Name: [name] MRN: [id number] DOB: 08-20-1945    ADMISSION DATE:  07/23/2013  REFERRING MD :  Forestine Na EDP  PRIMARY SERVICE: PCCM  CHIEF COMPLAINT:  Respiratory failure   BRIEF PATIENT DESCRIPTION: 68 yo male with hx COPD, CHF, CAD, St Jude valve replacement on coumadin presented 1/18 to [facility] ER with severe respiratory distress requiring intubation.  Brief cardiac arrest prior to intubation. Tx to [facility].    SIGNIFICANT EVENTS / STUDIES:  1/18 Pt presented to [facility] hypoxic and intubated with brief arrest during intubation. ProBNP elevated and fluid overloaded.   LINES / TUBES: ETT 1/18>>>1/20 OGT 1/18 >>>1/20 Foley 1/18 >>>1/21 L IJ 1/18 >>>1/21  CULTURES: BC x 2 1/18>>>1/2 bottle prelim positive with gram positive clusters Urine 1/18>>>no growth Sputum 1/18>>>strep pneumo [facility] 1/21 >>>  ANTIBIOTICS: None  Subjective: Patient with A fib with RVR in the AM and did well with metoprolol IV prn. He tolerated diet well without abdominal pain, chest pain, SOB. He has no complaints.    VITAL SIGNS: Temp:  [97.9 F (36.6 C)-98.7 F (37.1 C)] 97.9 F (36.6 C) (01/22 0407) Pulse Rate:  [59-157] 99 (01/22 0600) Resp:  [13-25] 20 (01/22 0600) BP: (93-151)/(49-82) 116/66 mmHg (01/22 0600) SpO2:  [85 %-97 %] 85 % (01/22 0600) Weight:  [148 lb 2.4 oz (67.2 kg)] 148 lb 2.4 oz (67.2 kg) (01/22 0412) HEMODYNAMICS:   VENTILATOR SETTINGS:   INTAKE / OUTPUT: Intake/Output     01/21 0701 - 01/22 0700 01/22 0701 - 01/23 0700   P.O.     I.V. (mL/kg) 223 (3.3)    NG/GT     Total Intake(mL/kg) 223 (3.3)    Urine (mL/kg/hr) 1224 (0.8)    Total Output 1224     Net -1001           PHYSICAL EXAMINATION: General:  Awake and pleasant, no distress Neuro:  Alert and oriented times 2, not to year HEENT:  PERRLA Cardiovascular:  irreg irreg, HV click noted, regular rate Lungs:  Lung sounds improved Abdomen:  +BS, non-tender, non-distended Musculoskeletal:  Moves all 4  extremities Skin:  No active  LABS:  CBC  Recent Labs Lab 07/24/13 0300 07/25/13 0415 07/26/13 0519  WBC 12.7* 9.2 7.5  HGB 10.3* 11.0* 9.8*  HCT 30.1* 32.7* 29.7*  PLT 218 208 232   Coag's  Recent Labs Lab 07/25/13 0415 07/26/13 0519 07/26/13 1530  INR 4.55* 2.45* 2.57*   BMET  Recent Labs Lab 07/25/13 0415 07/26/13 0519 07/26/13 1530  NA 138 140 137  K 3.9 4.0 4.1  CL 98 99 97  CO2 28 27 29   BUN 25* 26* 32*  CREATININE 0.85 0.97 1.14  GLUCOSE 158* 103* 132*   Electrolytes  Recent Labs Lab 07/24/13 0300 07/25/13 0415 07/26/13 0519 07/26/13 1530  CALCIUM 8.5 8.7 8.9 8.7  MG 1.8  --   --   --    Sepsis Markers  Recent Labs Lab 07/23/13 1009 07/23/13 2050 07/24/13 0052  LATICACIDVEN 5.2* 1.4 1.5   ABG  Recent Labs Lab 07/23/13 1120 07/23/13 1450  PHART 7.197* 7.272*  PCO2ART 62.0* 57.2*  PO2ART 80.4 70.0*   Liver Enzymes  Recent Labs Lab 07/23/13 1008 07/24/13 0300  AST 52* 52*  ALT 30 26  ALKPHOS 142* 103  BILITOT 0.9 0.5  ALBUMIN 3.6 2.9*   Cardiac Enzymes  Recent Labs Lab 07/23/13 1008  07/23/13 1852 07/24/13 0052 07/24/13 0800  TROPONINI <0.30  < >  4.35* 3.22* 3.06*  PROBNP 8081.0*  --   --   --   --   < > = values in this interval not displayed. Glucose  Recent Labs Lab 07/26/13 0406 07/26/13 0745 07/26/13 1141 07/26/13 1746 07/26/13 1925 07/26/13 2356  GLUCAP 98 94 128* 104* 184* 98    Imaging Dg Chest Port 1 View  07/26/2013   CLINICAL DATA:  Acute respiratory failure  EXAM: PORTABLE CHEST - 1 VIEW  COMPARISON:  July 24, 2013  FINDINGS: Endotracheal tube and nasogastric tube have been removed. The left jugular catheter tip is in the left innominate vein near the junction with the superior vena cava. No pneumothorax. There is increase in interstitial and patchy alveolar edema compared to 2 days prior. There is cardiomegaly with small effusions. There are prosthetic mitral and aortic valves. No  adenopathy.  IMPRESSION: Congestive heart failure with increased edema.  No pneumothorax.   Electronically Signed   By: Lowella Grip M.D.   On: 07/26/2013 07:33   CXR: no new  ASSESSMENT / PLAN:  PULMONARY Acute respiratory failure - Decompensated CHFwith COPD  COPD Pulmonary Edema   P:   Further diuresis per cardiology Albuterol/Atrovent nebs q 6 h  CARDIOVASCULAR Acute on chronic diastolic CHF -- EF 09% (3/81/8299), dry weight unclear but lowest recently is 155. CAD - s/p CABG/MVR/AVR  Chronic anticoagulation  Elevated troponin - Likely secondary to hypoxia, down trending.  A fib with RVR P:  ASA daily Metoprolol 100 q 12 IV metoprolol 5 mg prn q 5 min for HR > 115 Coumadin per pharmacy goal 2.5-3.5  Patient out of diuretic meds and needs to be addressed prior to D/C (pharmacy unable to get in torsemide and he could not fill) Likely DCCV today per cardiology  RENAL Hyponatremia -chronic  P:   Trend BMP  GASTROINTESTINAL A:  GI Px not indicated Nutrition P:   Diet heart   HEMATOLOGIC A:  Cardiogenic shock - Resolved Hypothermia - Resolved P:  Monitor Coumadin therapeutic for A fib  INFECTIOUS A:  No active, U/A unremarkable, blood and respiratory cultures pending. Likely 1/2 was contaminant but will follow for speciation P:   Monitor off Abx, follow blood cultures   ENDOCRINE A:  No active P:   Monitor   NEUROLOGIC A:  No active P:   Monitor  Likely stable for transfer to telemetry s/p DCCV today. [facility] to assume care 1/23 and PCCM to sign off.   Vertell Novak, PGY-3 07/27/2013  7:16 AM   PCCM ATTENDING: I have interviewed and examined the patient and reviewed the database. I have formulated the assessment and plan as reflected in the note above with amendments made by me.   Will perform DCCV in ICU. I am available for airway mgmt  Merton Border, MD;  PCCM service; Mobile [phone]

## 2013-07-27 NOTE — Progress Notes (Addendum)
Patient ID: [name], male   DOB: [birth year], 68 y.o.   MRN: [id number]   SUBJECTIVE: Patient went into atrial fibrillation yesterday morning and has remained in atrial fibrillation.  No chest pain or dyspnea at rest.   Scheduled Meds: . amLODipine  10 mg Oral Daily  . antiseptic oral rinse  15 mL Mouth Rinse QID  . aspirin  81 mg Oral Daily  . atorvastatin  40 mg Oral q1800  . chlorhexidine  15 mL Mouth Rinse BID  . furosemide  40 mg Intravenous Q8H  . ipratropium-albuterol  3 mL Nebulization Q6H  . metoprolol tartrate  100 mg Oral BID  . potassium chloride  20 mEq Oral Once  . sodium chloride  3 mL Intravenous Q12H  . Warfarin - Pharmacist Dosing Inpatient   Does not apply q1800   Continuous Infusions: . sodium chloride 250 mL (07/26/13 1556)   PRN Meds:.sodium chloride, acetaminophen, albuterol, hydrALAZINE, metoprolol, oxyCODONE, sodium chloride    Filed Vitals:   07/27/13 0900 07/27/13 0957 07/27/13 1000 07/27/13 1137  BP: 134/54 134/54 136/64   Pulse: 129  91   Temp:   98.2 F (36.8 C) 98.7 F (37.1 C)  TempSrc:   Oral Oral  Resp: 23  24   Height:      Weight:      SpO2: 92%  92%     Intake/Output Summary (Last 24 hours) at 07/27/13 1149 Last data filed at 07/27/13 1100  Gross per 24 hour  Intake    423 ml  Output   1599 ml  Net  -1176 ml    LABS: Basic Metabolic Panel:  Recent Labs  07/26/13 0519 07/26/13 1530  NA 140 137  K 4.0 4.1  CL 99 97  CO2 27 29  GLUCOSE 103* 132*  BUN 26* 32*  CREATININE 0.97 1.14  CALCIUM 8.9 8.7   Liver Function Tests: No results found for this basename: AST, ALT, ALKPHOS, BILITOT, PROT, ALBUMIN,  in the last 72 hours No results found for this basename: LIPASE, AMYLASE,  in the last 72 hours CBC:  Recent Labs  07/25/13 0415 07/26/13 0519  WBC 9.2 7.5  HGB 11.0* 9.8*  HCT 32.7* 29.7*  MCV 100.3* 101.4*  PLT 208 232   Cardiac Enzymes: No results found for this basename: CKTOTAL, CKMB, CKMBINDEX,  TROPONINI,  in the last 72 hours BNP: No components found with this basename: POCBNP,  D-Dimer: No results found for this basename: DDIMER,  in the last 72 hours Hemoglobin A1C: No results found for this basename: HGBA1C,  in the last 72 hours Fasting Lipid Panel: No results found for this basename: CHOL, HDL, LDLCALC, TRIG, CHOLHDL, LDLDIRECT,  in the last 72 hours Thyroid Function Tests: No results found for this basename: TSH, T4TOTAL, FREET3, T3FREE, THYROIDAB,  in the last 72 hours Anemia Panel: No results found for this basename: VITAMINB12, FOLATE, FERRITIN, TIBC, IRON, RETICCTPCT,  in the last 72 hours  RADIOLOGY: Dg Chest Port 1 View  07/24/2013   CLINICAL DATA:  Ventilator  EXAM: PORTABLE CHEST - 1 VIEW  COMPARISON:  07/24/2013  FINDINGS: Endotracheal tube remains in good position. NG tube tip not well seen but may be in the distal esophagus. Left jugular catheter tip in the SVC in unchanged. No pneumothorax  Diffuse bilateral airspace disease shows mild interval improvement. Possible edema.  IMPRESSION: Mild improvement in diffuse bilateral airspace disease consistent with edema.   Electronically Signed   By: Franchot Gallo M.D.  On: 07/24/2013 14:13   Dg Chest Port 1 View  07/24/2013   CLINICAL DATA:  Acute respiratory failure. On ventilator. Congestive heart failure. Coronary artery disease.  EXAM: PORTABLE CHEST - 1 VIEW  COMPARISON:  07/23/2013  FINDINGS: Support lines and tubes in appropriate position. Diffuse bilateral airspace disease and probable layering bilateral pleural effusion show no significant change. Cardiomegaly is stable.  IMPRESSION: No significant change compared with prior exam.   Electronically Signed   By: Earle Gell M.D.   On: 07/24/2013 07:17   Dg Chest Port 1 View  07/23/2013   CLINICAL DATA:  Central line placement.  EXAM: PORTABLE CHEST - 1 VIEW  COMPARISON:  Chest x-ray 08/25/2013.  FINDINGS: An endotracheal tube is in place with tip 4.8 cm above the  carina. A nasogastric tube is seen extending into the stomach, however, the tip of the nasogastric tube extends below the lower margin of the image. There is a left-sided internal jugular central venous catheter with tip terminating in the proximal superior vena cava. Lung volumes are slightly low. There continues to be widespread interstitial and airspace disease throughout the lungs bilaterally, with some cephalization of the pulmonary vasculature, most compatible with moderate pulmonary edema. Overall, this is slightly improved compared to yesterday's examination. Probable trace bilateral pleural effusions. Heart size remains mildly enlarged. The patient is rotated to the right on today's exam, resulting in distortion of the mediastinal contours and reduced diagnostic sensitivity and specificity for mediastinal pathology. Atherosclerosis in the thoracic aorta. Status post median sternotomy for CABG and mechanical mitral and aortic valve replacements.  IMPRESSION: 1. Overall, there appears to be slightly improved aeration compared to yesterday's examination, however, there is still evidence for moderate congestive heart failure. 2. Support apparatus and postoperative changes, as above.   Electronically Signed   By: Vinnie Langton M.D.   On: 07/23/2013 14:22   Dg Chest Portable 1 View  07/23/2013   CLINICAL DATA:  Endotracheal tube placement  EXAM: PORTABLE CHEST - 1 VIEW  COMPARISON:  05/29/2013  FINDINGS: Moderate cardiac enlargement. Status post CABG and replacement cardiac valve placement. Endotracheal tube is seen with tip 3.9 cm above the carina. There is vascular congestion in. There is moderate to severe interstitial prominence with more confluent alveolar opacities in the mid lung zones, right worse than left.  IMPRESSION: Endotracheal tube as described. Congestive heart failure with moderate to severe pulmonary edema.   Electronically Signed   By: Skipper Cliche M.D.   On: 07/23/2013 10:10     PHYSICAL EXAM General: NAD Neck: JVP 10-11 cm, no thyromegaly or thyroid nodule.  Lungs: Crackles at bases bilaterally CV: Nondisplaced PMI.  Heart irregular S1/S2, mechanical S1/S2, 2/6 SEM.  No peripheral edema.   Abdomen: Soft, nontender, no hepatosplenomegaly, no distention.  Neurologic: Sleepy but awakens and converses  Psych: Normal affect. Extremities: No clubbing or cyanosis.   TELEMETRY: Reviewed telemetry pt in NSR  ASSESSMENT AND PLAN: 68 yo with history of CAD s/p CABG, mechanical MV and AoV, severe COPD, and diastolic CHF had PEA arrest likely due to hypoxia in setting of acute/chronic diastolic CHF and baseline severe COPD. Extubated Tuesday.  He went into atrial fibrillation on Wednesday.  1. Acute on chronic diastolic CHF: Has had considerable noncompliance with low sodium diet. He has been diuresed and extubated.  Echo with EF 50-55%, consistent with prior.  He remains volume overloaded, diuresis was not particularly vigorous.  - Increase lasix to 40 mg IV every 8 hrs. -  No BMET today, will order.  2. Mechanical aortic and mitral valves: Appear to be functioning normally on echo.  Continue warfarin goal INR 2.5-3.5 and ASA 81.  3. COPD: Stable.  4. CAD: Prior CABG.  Elevated troponin this admission but mild.  Suspect primary event was acute on chronic diastolic CHF with hypoxia leading to PEA arrest.  No chest pain.  No invasive evaluation at this time.  Continue statin, ASA 81.  Would consider Cardiolite to evaluate for ischemia after discharge.  5. Atrial fibrillation: He went into atrial fibrillation yesterday.  He has been therapeutic on warfarin (INR 2.2 today).  Will attempt DCCV today (no TEE required).  He is NPO.  Suspect atrial fibrillation will make his CHF more difficult to manage.   Loralie Champagne 07/27/2013 11:49 AM

## 2013-07-28 ENCOUNTER — Encounter (HOSPITAL_COMMUNITY)
Admission: EM | Disposition: A | Discharge status: Home / Self Care | Payer: Self-pay | Source: Home / Self Care | Attending: Critical Care Medicine

## 2013-07-28 DIAGNOSIS — R5381 Other malaise: Secondary | ICD-10-CM

## 2013-07-28 DIAGNOSIS — J9600 Acute respiratory failure, unspecified whether with hypoxia or hypercapnia: Secondary | ICD-10-CM

## 2013-07-28 LAB — CULTURE, BLOOD (ROUTINE X 2)
CULTURE: NO GROWTH
Culture: NO GROWTH

## 2013-07-28 LAB — GLUCOSE, CAPILLARY: Glucose-Capillary: 115 mg/dL — ABNORMAL HIGH (ref 70–99)

## 2013-07-28 LAB — CBC
HEMATOCRIT: 31.8 % — AB (ref 39.0–52.0)
HEMOGLOBIN: 10.2 g/dL — AB (ref 13.0–17.0)
MCH: 32.6 pg (ref 26.0–34.0)
MCHC: 32.1 g/dL (ref 30.0–36.0)
MCV: 101.6 fL — ABNORMAL HIGH (ref 78.0–100.0)
Platelets: 275 10*3/uL (ref 150–400)
RBC: 3.13 MIL/uL — ABNORMAL LOW (ref 4.22–5.81)
RDW: 14.2 % (ref 11.5–15.5)
WBC: 8.4 10*3/uL (ref 4.0–10.5)

## 2013-07-28 LAB — BASIC METABOLIC PANEL
BUN: 25 mg/dL — AB (ref 6–23)
CHLORIDE: 98 meq/L (ref 96–112)
CO2: 29 mEq/L (ref 19–32)
Calcium: 9.3 mg/dL (ref 8.4–10.5)
Creatinine, Ser: 0.91 mg/dL (ref 0.50–1.35)
GFR calc Af Amer: >90 mL/min (ref >90)
GFR, EST NON AFRICAN AMERICAN: 85 mL/min — AB (ref >90)
GLUCOSE: 94 mg/dL (ref 70–99)
Potassium: 4.4 mEq/L (ref 3.7–5.3)
Sodium: 140 mEq/L (ref 137–147)

## 2013-07-28 LAB — PROTIME-INR
INR: 2.54 — ABNORMAL HIGH (ref 0.00–1.49)
Prothrombin Time: 26.5 seconds — ABNORMAL HIGH (ref 11.6–15.2)

## 2013-07-28 SURGERY — CARDIOVERSION
Anesthesia: Monitor Anesthesia Care

## 2013-07-28 MED ORDER — GUAIFENESIN-CODEINE 100-10 MG/5ML PO SOLN
5.0000 mL | Freq: Four times a day (QID) | ORAL | Status: DC | PRN
  Administered 2013-07-28 – 2013-07-29 (×2): 5 mL via ORAL
  Filled 2013-07-28 (×3): qty 5

## 2013-07-28 MED ORDER — IPRATROPIUM-ALBUTEROL 0.5-2.5 (3) MG/3ML IN SOLN
3.0000 mL | Freq: Four times a day (QID) | RESPIRATORY_TRACT | Status: DC
  Administered 2013-07-28 – 2013-07-30 (×7): 3 mL via RESPIRATORY_TRACT
  Filled 2013-07-28 (×8): qty 3

## 2013-07-28 MED ORDER — WARFARIN SODIUM 7.5 MG PO TABS
7.5000 mg | ORAL_TABLET | Freq: Once | ORAL | Status: AC
  Administered 2013-07-28: 7.5 mg via ORAL
  Filled 2013-07-28: qty 1

## 2013-07-28 NOTE — Significant Event (Signed)
PT recommending CIR.  Have placed order for CIR evaluation.  Will likely remain in hospital pending CIR evaluation.  Will transfer service to [facility] 1/24 and PCCM sign off.  Chesley Mires, MD [facility] Pulmonary/Critical Care 07/28/2013, 1:27 PM Pager:  [phone] After 3pm call: [phone]

## 2013-07-28 NOTE — Evaluation (Signed)
Physical Therapy Evaluation Patient Details Name: [name] MRN: [id number] DOB: [birth year] Today's Date: 07/28/2013 Time: 4098-1191 PT Time Calculation (min): 32 min  PT Assessment / Plan / Recommendation History of Present Illness  68 yo male with hx COPD, CHF, CAD, St Jude valve replacement on coumadin presented 1/18 to [facility] ER with severe respiratory distress requiring intubation.  Brief cardiac arrest prior to intubation. Tx to [facility].    Clinical Impression  Pt admitted with severe respiratory distress requiring intubation. Pt currently with functional limitations due to the deficits listed below (see PT Problem List). Pt will benefit from skilled PT to increase their independence and safety with mobility to allow discharge to the venue listed below. 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  Pt is unsteady without ad and desat to 83% while ambulating 20' with RW on 4 L/min.  Due to these objective findings, recommend CIR.  Pt is apprehensive about further therapy after d/c from acute care.  Pt has questionable insight into his current level of functional mobility.     PT Assessment  Patient needs continued PT services    Follow Up Recommendations  CIR (if pt agreeable)    Does the patient have the potential to tolerate intense rehabilitation      Barriers to Discharge        Equipment Recommendations  None recommended by PT    Recommendations for Other Services Rehab consult   Frequency Min 3X/week    Precautions / Restrictions     Pertinent Vitals/Pain o2 83% with 20' gait on 4 L/min, increased back into 90's in 1 minute.      Mobility  Bed Mobility Overal bed mobility: Needs Assistance Bed Mobility: Supine to Sit Supine to sit: HOB elevated;Supervision General bed mobility comments: HOB elevated with rails Transfers Overall transfer level: Needs assistance Transfers: Sit to/from  Stand Sit to Stand: Min assist General transfer comment: Initial unsteadiness upon standing Ambulation/Gait Ambulation/Gait assistance: Min assist Ambulation Distance (Feet): 20 Feet Assistive device: Rolling walker (2 wheeled) Gait Pattern/deviations: Decreased step length - right;Decreased step length - left;Narrow base of support General Gait Details: Attempted gait without RW and pt too unsteady.  Trial with RW and improved balance with decreased step length and narrow BOS.  o2 dropped to 83% with gait on 4 L/min via nasal canula.  INcreased back into 90s within 1 minute    Exercises     PT Diagnosis: Difficulty walking;Generalized weakness  PT Problem List: Decreased strength;Decreased activity tolerance;Decreased balance;Cardiopulmonary status limiting activity PT Treatment Interventions: Gait training;Stair training;Functional mobility training;Therapeutic activities;Therapeutic exercise     PT Goals(Current goals can be found in the care plan section) Acute Rehab PT Goals Patient Stated Goal: To get stronger and go home PT Goal Formulation: With patient Time For Goal Achievement: 08/11/13 Potential to Achieve Goals: Good  Visit Information  Last PT Received On: 07/28/13 Assistance Needed: +1 History of Present Illness: 68 yo male with hx COPD, CHF, CAD, St Jude valve replacement on coumadin presented 1/18 to [facility] ER with severe respiratory distress requiring intubation.  Brief cardiac arrest prior to intubation. Tx to [facility].         Prior [place] expects to be discharged to:: Private residence Living Arrangements: Alone Available Help at Discharge: Family Type of Home: Mobile home Home Access: Stairs to enter Entrance Stairs-Number of Steps: 2 Entrance Stairs-Rails: Right  Home Layout: One level Home Equipment: Walker - 2 wheels;Cane - quad;Shower seat Additional Comments: states he has o2 at home that he wears as  needed. 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: No difficulties    Solicitor Arousal/Alertness: Awake/alert Behavior During Therapy: WFL for tasks assessed/performed Overall Cognitive Status: Within Functional Limits for tasks assessed    Extremity/Trunk Assessment Upper Extremity Assessment Upper Extremity Assessment: Defer to OT evaluation Lower Extremity Assessment Lower Extremity Assessment: Generalized weakness Cervical / Trunk Assessment Cervical / Trunk Assessment: Normal   Balance Balance Overall balance assessment: Needs assistance Standing balance-Leahy Scale: Fair  End of Session PT - End of Session Equipment Utilized During Treatment: Gait belt;Oxygen Activity Tolerance: Treatment limited secondary to medical complications (Comment) (desat to 83% with 20 feet gait on 4 L/min) Patient left: in chair;with call bell/phone within reach Nurse Communication: Mobility status (o2 )  GP     [name] 07/28/2013, 1:22 PM

## 2013-07-28 NOTE — Progress Notes (Signed)
Name: [name] MRN: [id number] DOB: 09/19/1945    ADMISSION DATE:  07/23/2013  REFERRING MD :  Forestine Na EDP   CHIEF COMPLAINT:  Respiratory failure   BRIEF PATIENT DESCRIPTION:  68 yo male with hx COPD, CHF, CAD, St Jude valve replacement on coumadin presented 1/18 to [facility] ER with severe respiratory distress requiring intubation.  Brief cardiac arrest prior to intubation. Tx to [facility].    SIGNIFICANT EVENTS / STUDIES:  1/18 Pt presented to [facility] hypoxic and intubated with brief arrest during intubation. ProBNP elevated and fluid overloaded.  1/19 2D Echo - LV EF: 50% - 55% 1/22 DCCV in ICU, then tx to telemetry.   LINES / TUBES: ETT 1/18>>>1/20 OGT 1/18 >>>1/20 Foley 1/18 >>>1/21 L IJ 1/18 >>>1/21  CULTURES: BC x 2 1/18>>>1/2 bottle prelim positive with gram positive clusters Urine 1/18>>>no growth Sputum 1/18>>>strep pneumo [facility] 1/21 >>>  ANTIBIOTICS: None  Subjective: Patient comfortable, no complaints on 4L O2.   VITAL SIGNS: Temp:  [97.4 F (36.3 C)-98.9 F (37.2 C)] 98.6 F (37 C) (01/23 0513) Pulse Rate:  [62-107] 87 (01/23 1016) Resp:  [15-28] 20 (01/23 0513) BP: (114-162)/(47-81) 135/47 mmHg (01/23 1016) SpO2:  [84 %-98 %] 97 % (01/23 1023) Weight:  [65.227 kg (143 lb 12.8 oz)-66.4 kg (146 lb 6.2 oz)] 65.227 kg (143 lb 12.8 oz) (01/23 0513) 4 liters  INTAKE / OUTPUT: Intake/Output     01/22 0701 - 01/23 0700 01/23 0701 - 01/24 0700   P.O. 754 600   I.V. (mL/kg) 310 (4.8) 3 (0)   Total Intake(mL/kg) 1064 (16.3) 603 (9.2)   Urine (mL/kg/hr) 2740 (1.8) 620 (2.5)   Total Output 2740 620   Net -1676 -17         PHYSICAL EXAMINATION: General:  Awake and pleasant, no distress Neuro:  Alert and oriented times 3 HEENT:  PERRLA Cardiovascular:  RRR, HV click noted. NSR on monitor.  Lungs:  Expiratory wheezes L>R Abdomen:  +BS, non-tender, non-distended Musculoskeletal:  Moves all 4 extremities Skin:  No active  LABS:  CBC  Recent  Labs Lab 07/25/13 0415 07/26/13 0519 07/28/13 0505  WBC 9.2 7.5 8.4  HGB 11.0* 9.8* 10.2*  HCT 32.7* 29.7* 31.8*  PLT 208 232 275   Coag's  Recent Labs Lab 07/26/13 1530 07/27/13 0735 07/28/13 0505  INR 2.57* 2.22* 2.54*   BMET  Recent Labs Lab 07/26/13 1530 07/27/13 1210 07/28/13 0505  NA 137 138 140  K 4.1 3.9 4.4  CL 97 98 98  CO2 29 27 29   BUN 32* 27* 25*  CREATININE 1.14 0.95 0.91  GLUCOSE 132* 91 94   Electrolytes  Recent Labs Lab 07/24/13 0300  07/26/13 1530 07/27/13 1210 07/28/13 0505  CALCIUM 8.5  < > 8.7 9.0 9.3  MG 1.8  --   --   --   --   < > = values in this interval not displayed. Sepsis Markers  Recent Labs Lab 07/23/13 1009 07/23/13 2050 07/24/13 0052  LATICACIDVEN 5.2* 1.4 1.5   ABG  Recent Labs Lab 07/23/13 1120 07/23/13 1450  PHART 7.197* 7.272*  PCO2ART 62.0* 57.2*  PO2ART 80.4 70.0*   Liver Enzymes  Recent Labs Lab 07/23/13 1008 07/24/13 0300  AST 52* 52*  ALT 30 26  ALKPHOS 142* 103  BILITOT 0.9 0.5  ALBUMIN 3.6 2.9*   Cardiac Enzymes  Recent Labs Lab 07/23/13 1008  07/23/13 1852 07/24/13 0052 07/24/13 0800  TROPONINI <0.30  < >  4.35* 3.22* 3.06*  PROBNP 8081.0*  --   --   --   --   < > = values in this interval not displayed. Glucose  Recent Labs Lab 07/26/13 1746 07/26/13 1925 07/26/13 2356 07/27/13 0753 07/27/13 1135 07/27/13 1729  GLUCAP 104* 184* 98 103* 94 161*    Intake/Output Summary (Last 24 hours) at 07/28/13 1052 Last data filed at 07/28/13 1018  Gross per 24 hour  Intake   1508 ml  Output   2985 ml  Net  -1477 ml   Filed Weights   07/27/13 0412 07/27/13 2215 07/28/13 0513  Weight: 67.2 kg (148 lb 2.4 oz) 66.4 kg (146 lb 6.2 oz) 65.227 kg (143 lb 12.8 oz)    Imaging No results found. ASSESSMENT / PLAN:  A: Acute respiratory failure (improved) - 2nd to decompensated CHF in setting of COPD.  P:   Continue duoneb F/u CXR intermittently Assess for home  oxygen  A: Acute on chronic diastolic CHF -- EF 97% (3/53/2992), dry weight unclear but lowest recently is 155. CAD - s/p CABG/MVR/AVR  Hx of HTN Chronic anticoagulation  Elevated troponin - Likely secondary to hypoxia, down trending.  A fib with RVR - s/p cardioversion 1/22. P:  ASA daily Metoprolol 100 mg q12 IV metoprolol 5 mg prn q 5 min for HR > 115 Coumadin per pharmacy goal 2.5-3.5  Patient out of diuretic meds and needs to be addressed prior to D/C (pharmacy unable to get in torsemide and he could not fill) Continue lipitor Holding outpt plavix - defer to cardiology whether to resume these  A: Chronic hyponatremia - improved. P: Trend BMP intermittently  A: Anemia of chronic disease. P: F/u CBC intermittently   Georgann Housekeeper, ACNP [facility] Pulmonology/Critical Care Pager [phone] or [phone]   PCCM ATTENDING: Reviewed above, examined pt, and agree with assessment/plan.  Cardiac and respiratory status improved.  Will check with cardiology whether additional inpt monitoring needed.  Will need to assess whether he will need home oxygen.  Will get PT to assess.  Depending on these, then will decide when he will be ready for d/c home >> likely soon.  Chesley Mires, MD [facility] Pulmonary/Critical Care 07/28/2013, 12:02 PM Pager:  [phone] After 3pm call: [phone]

## 2013-07-28 NOTE — Progress Notes (Signed)
Patient alert and oriented x4 throughout shift; family at bedside this afternoon.  Weaned oxygen from 6L/min to 4L/min via nasal cannula this shift.  Patient ambulated with PT in room (20 ft), desaturated to 83% on 4L/min via [place].  Patient returned to 96% on 4L/min when at rest in chair.  Condom catheter remains in place.  Patient states he has no questions or concerns at this time.  Will continue to monitor.

## 2013-07-28 NOTE — Progress Notes (Signed)
Pt. C/o dry cough. On call NP, Jonette Eva, made aware. RN will continue to monitor pt. For changes in condition. Magan Winnett, Katherine Roan

## 2013-07-28 NOTE — Progress Notes (Signed)
ANTICOAGULATION CONSULT NOTE - Follow Up Consult  Pharmacy Consult for Coumadin  Indication: Afib, AVR, MVR   No Known Allergies  Patient Measurements: Height: 5\' 6"  (167.6 cm) Weight: 143 lb 12.8 oz (65.227 kg) (B SCALE) IBW/kg (Calculated) : 63.8  Vital Signs: Temp: 98.6 F (37 C) (01/23 0513) Temp src: Oral (01/23 0513) BP: 135/47 mmHg (01/23 1016) Pulse Rate: 87 (01/23 1016)  Labs:  Recent Labs  07/26/13 0519 07/26/13 1530 07/27/13 0735 07/27/13 1210 07/28/13 0505  HGB 9.8*  --   --   --  10.2*  HCT 29.7*  --   --   --  31.8*  PLT 232  --   --   --  275  LABPROT 25.8* 26.7* 23.9*  --  26.5*  INR 2.45* 2.57* 2.22*  --  2.54*  CREATININE 0.97 1.14  --  0.95 0.91    Estimated Creatinine Clearance: 70.1 ml/min (by C-G formula based on Cr of 0.91).   Assessment: 69 year old male on chronic Coumadin therapy for Afib, MVR, AVR admitted on 1/18 for respiratory distress and brief cardiac arrest. Pharmacy consulted to dose warfarin  Patient's Coumadin dose at home is 7.5 mg MWF, and 5 mg all other days. Per wife, last dose taken 1/17 evening. Doses held 1/18 to 1/20 for elevated INRs.   Anticoagulation: Hx afib, MVR/AVR 2003 ([address] [place]), on Coumadin PTA. Goes to [facility] (last note 12/29), INR goal 2.5-3.5. Home dose supposed to be 5 mg daily except 7.5 mg on MWF. Today's INR at goal. Patient continues to have no signs of bleeding at this time.   Goal of Therapy:  INR 2.5-3.5 Monitor platelets by anticoagulation protocol: Yes   Plan:  1. Continue Coumadin 7.5 mg PO X 1 tonight  2. Monitor daily INR, CBC, and signs of bleeding   Thank you for allowing pharmacy to be a part of this patients care team.  [name] Pharm.D., BCPS Clinical Pharmacist 07/28/2013 11:56 AM Pager: [phone] Phone: [phone]

## 2013-07-28 NOTE — Progress Notes (Addendum)
Patient Name: [name] Date of Encounter: 07/28/2013  Principal Problem:   Acute respiratory failure with hypoxia Active Problems:   Arteriosclerotic cardiovascular disease (ASCVD)   Acute on chronic diastolic CHF (congestive heart failure), NYHA class 4   CHF (congestive heart failure)    SUBJECTIVE: Breathing is better, but he was only occasionally wearing oxygen as OP. Admits dietary non-compliance w/ low-Na diet. Also was continuing to smoke but hopes he will be able to quit.   OBJECTIVE Filed Vitals:   07/28/13 0449 07/28/13 0513 07/28/13 1016 07/28/13 1023  BP: 137/64 134/55 135/47   Pulse: 92 87 87   Temp: 98.2 F (36.8 C) 98.6 F (37 C)    TempSrc: Oral Oral    Resp: 18 20    Height:      Weight:  143 lb 12.8 oz (65.227 kg)    SpO2: 96% 95% 97% 97%    Intake/Output Summary (Last 24 hours) at 07/28/13 1238 Last data filed at 07/28/13 1018  Gross per 24 hour  Intake   1303 ml  Output   2535 ml  Net  -1232 ml   Filed Weights   07/27/13 0412 07/27/13 2215 07/28/13 0513  Weight: 148 lb 2.4 oz (67.2 kg) 146 lb 6.2 oz (66.4 kg) 143 lb 12.8 oz (65.227 kg)    PHYSICAL EXAM General: Well developed, chronically-ill appearing  male in no acute distress. Head: Normocephalic, atraumatic.  Neck: Supple without bruits, JVD slightly elevated. Lungs:  Resp regular with decreased BS bases and rales, no wheezing noted. Heart: RRR, S1, S2, no S3, S4, or murmur; no rub. Abdomen: Soft, non-tender, non-distended, BS + x 4.  Extremities: No clubbing, cyanosis, no edema.  Neuro: Alert and oriented X 3. Moves all extremities spontaneously. Psych: Normal affect.  LABS: CBC: Recent Labs  07/26/13 0519 07/28/13 0505  WBC 7.5 8.4  HGB 9.8* 10.2*  HCT 29.7* 31.8*  MCV 101.4* 101.6*  PLT 232 275   INR: Recent Labs  07/28/13 0505  INR 1.66*   Basic Metabolic Panel: Recent Labs  07/27/13 1210 07/28/13 0505  NA 138 140  K 3.9 4.4  CL 98 98  CO2 27 29    GLUCOSE 91 94  BUN 27* 25*  CREATININE 0.95 0.91  CALCIUM 9.0 9.3   BNP: Pro B Natriuretic peptide (BNP)  Date/Time Value Range Status  07/23/2013 10:08 AM 8081.0* 0 - 125 pg/mL Final  05/29/2013  8:26 AM 6106.0* 0 - 125 pg/mL Final    TELE: SR since DCCV 01/22  Current Medications:  . amLODipine  10 mg Oral Daily  . antiseptic oral rinse  15 mL Mouth Rinse BID  . aspirin  81 mg Oral Daily  . atorvastatin  40 mg Oral q1800  . furosemide  40 mg Oral Q8H  . ipratropium-albuterol  3 mL Nebulization Q6H WA  . metoprolol tartrate  100 mg Oral BID  . sodium chloride  3 mL Intravenous Q12H  . warfarin  7.5 mg Oral ONCE-1800  . Warfarin - Pharmacist Dosing Inpatient   Does not apply q1800   . sodium chloride 250 mL (07/26/13 1556)    ASSESSMENT AND PLAN: Principal Problem:   Acute respiratory failure with hypoxia - mgt per CCM  Active Problems:   Arteriosclerotic cardiovascular disease (ASCVD)    Acute on chronic diastolic CHF (congestive heart failure), NYHA class 4 - improved, weight is below baseline. PTA, Lasix 80 mg BID changed to Torsemide 40 mg BID  for LEE. Consider changing Lasix to 40 mg PO BID (compliance better w/ BID dosing as OP) and use an extra tablet daily for weight gain. Renal function is stable, weight not increasing.     CHF (congestive heart failure) - see above     Atrial fib - s/p DCCV 01/22, maintaining SR, on metoprolol 100 mg BID. HR and BP generally elevated, MD advise on dose change.     Chronic anticoag - INR therapeutic, continue coumadin. Has coum appt 01/26  Otherwise, per CCM. Will sch F/U appts for next week.  Signed, Rosaria Ferries , PA-C 12:38 PM 07/28/2013 Patient seen and examined and history reviewed. Agree with above findings and plan. Patient is breathing better. Ambulating with PT. Maintaining NSR with PACs. HR increased to 120 bpm with walking. I would continue metoprolol 100 mg bid. Therapeutic INR. Still diuresing well on oral  lasix. Continue current therapy, PT. Good mechanical AV and MV clicks.   Collier Salina [facility] 07/28/2013 1:12 PM

## 2013-07-28 NOTE — Consult Note (Signed)
Physical Medicine and Rehabilitation Consult Reason for Consult: Deconditioning/multi-medical Referring Physician: Critical care   HPI: [name] is a 68 y.o. right-handed male with history of COPD and oxygen as needed, systolic congestive heart failure, coronary artery disease with St. Jude valve replacement on chronic Coumadin. Presented to [facility] 07/23/2013 with severe respiratory distress requiring intubation. Noted PEA cardiac arrest/A. fib prior to intubation. Cardiology followup NSTEMI in setting of demand ischemia during hypotension hypoxia. Placed on beta blocker for A. fib status post DCCV 07/27/2013. Patient remains on chronic anti-coagulation. Extubated 07/25/2013. Echocardiogram with ejection fraction of 55% and normal-appearing mechanical AVR valve. Physical therapy evaluation completed 07/28/2013  with recommendations for physical medicine rehabilitation consult to consider inpatient rehabilitation services.  Review of Systems  Respiratory: Positive for shortness of breath.   Cardiovascular: Positive for palpitations.  Gastrointestinal:       GERD  All other systems reviewed and are negative.   Past Medical History  Diagnosis Date  . Hypertension   . CAD (coronary artery disease) 2003    a. CABG/MVR/AVR-2003 (#25 St. Jude/#21 St. Jude); anticoagulation; b. negative stress nuclear-2005;  c. 01/2013 NSTEMI/Cath/PCI: LM nl, LAD 50p, 40-4m, D1 50ost, LCX 9m, RCA 50/10m (2.5x20 Promus DES), 50d, VG->Diag 100, VG->PDA 100, VG->OM 100, LIMA->LAD ok.  . Epistaxis     requiring cautery & aterial ligation-09/2009  . GERD (gastroesophageal reflux disease)   . Hyperlipemia   . Abnormal LFTs     possible cirrhosis  . Chronic anticoagulation   . Valvular heart disease     a. 2003: MVR/AVR-2003 (#25 St. Jude/#21 St. Jude);  b. 01/2013 Echo: EF 55%, Mech AVR mean grad 12, Mech MVR mean grad 6.  . Tobacco abuse     45 pack years  . Fasting hyperglycemia   .  Nephrolithiasis   . Diverticulosis   . Hyponatremia   . Chronic diastolic CHF (congestive heart failure)   . Chronic renal disease   . Hemolytic anemia     history of  . ETOH abuse   . A-fib 01/16/2013    a. on coumadin  . COPD (chronic obstructive pulmonary disease)     CONTROLED   Past Surgical History  Procedure Laterality Date  . Endocopic sphenopalatine artery ligation & cautry    . Inguinal hernia repair      Left & right  . Coronary artery bypass graft  11/2001    [facility]  . Cardiac valve replacement  11/2001    AVR and MVR-St. Jude devices  . Cataract extraction w/phaco  01/20/2011    Procedure: CATARACT EXTRACTION PHACO AND INTRAOCULAR LENS PLACEMENT (IOC);  Surgeon: Elta Guadeloupe T. Gershon Crane;  Location: [facility] ORS;  Service: Ophthalmology;  Laterality: Right;  CDE: 8.51  . Cataract extraction w/phaco  02/03/2011    Procedure: CATARACT EXTRACTION PHACO AND INTRAOCULAR LENS PLACEMENT (IOC);  Surgeon: Elta Guadeloupe T. Gershon Crane;  Location: [facility] ORS;  Service: Ophthalmology;  Laterality: Left;  CDE:10.01  . Colonoscopy  04/05/02; 08/2011    friable anal canal hemorrhoids otherwise normal; 2 diminutive polyps excised, minimal diverticulosis noted  . Esophagogastroduodenoscopy  01/2002    Dr. Laural Golden, submucosal esophageal lesion c/w leiomyoma  . Colonoscopy  09/02/2011    Procedure: COLONOSCOPY;  Surgeon: Daneil Dolin, MD;  Location: [facility] END[facility] SUITE;  Service: Endoscopy;  Laterality: N/A;  8:15  . Yag laser application Right 35/57/3220    Procedure: YAG LASER APPLICATION;  Surgeon: Elta Guadeloupe T. Gershon Crane, MD;  Location: [facility] OR[facility];  Service: Ophthalmology;  Laterality: Right;   Family History  Problem Relation Age of Onset  . Hypotension Neg Hx   . Anesthesia problems Neg Hx   . Malignant hyperthermia Neg Hx   . Pseudochol deficiency Neg Hx   . Colon cancer Neg Hx   . Liver disease Neg Hx    Social History:  reports that he has been smoking Cigarettes.  He has a 13 pack-year smoking history. He has  never used smokeless tobacco. He reports that he drinks about 4.2 ounces of alcohol per week. He reports that he does not use illicit drugs. Allergies: No Known Allergies Medications Prior to Admission  Medication Sig Dispense Refill  . amLODipine (NORVASC) 10 MG tablet Take 10 mg by mouth at bedtime.      Marland Kitchen atorvastatin (LIPITOR) 40 MG tablet Take 40 mg by mouth daily.      . clopidogrel (PLAVIX) 75 MG tablet Take 1 tablet (75 mg total) by mouth daily with breakfast.  30 tablet  6  . HYDROcodone-acetaminophen (NORCO) 10-325 MG per tablet Take 1 tablet by mouth every 6 (six) hours as needed (pain).      Marland Kitchen levalbuterol (XOPENEX) 0.63 MG/3ML nebulizer solution Take 3 mLs (0.63 mg total) by nebulization every 6 (six) hours as needed for wheezing or shortness of breath.  3 mL  4  . metoprolol (LOPRESSOR) 100 MG tablet Take 1 tablet (100 mg total) by mouth 2 (two) times daily.  60 tablet  6  . nitroGLYCERIN (NITROSTAT) 0.4 MG SL tablet Place 1 tablet (0.4 mg total) under the tongue every 5 (five) minutes x 3 doses as needed for chest pain.  25 tablet  3  . pantoprazole (PROTONIX) 40 MG tablet Take 1 tablet (40 mg total) by mouth daily.  30 tablet  6  . potassium chloride SA (K-DUR,KLOR-CON) 20 MEQ tablet Take 20 mEq by mouth daily.      Marland Kitchen torsemide (DEMADEX) 20 MG tablet Take 2 tablets (40 mg total) by mouth 2 (two) times daily.  120 tablet  6  . warfarin (COUMADIN) 5 MG tablet Take 5-7.5 mg by mouth daily. Take 1 1/2 tablets (7.5 mg) on Monday, Wednesday, Friday, take 1 tablet (5 mg) on Sunday, Tuesday, Thursday and Saturday      . acetaminophen (TYLENOL) 325 MG tablet Take 2 tablets (650 mg total) by mouth every 6 (six) hours as needed. For pain  30 tablet  0  . ferrous sulfate 325 (65 FE) MG EC tablet Take 325 mg by mouth daily with breakfast.      . folic acid (FOLVITE) 1 MG tablet Take 1 tablet (1 mg total) by mouth daily.  30 tablet  4  . magnesium oxide (MAG-OX) 400 (241.3 MG) MG tablet Take 1  tablet (400 mg total) by mouth daily.  30 tablet  6  . Multiple Vitamin (MULTIVITAMIN WITH MINERALS) TABS Take 1 tablet by mouth daily.      Marland Kitchen thiamine 100 MG tablet Take 1 tablet (100 mg total) by mouth daily.  30 tablet  4    Home: Home Living Family/patient expects to be discharged to:: Private residence Living Arrangements: Alone Available Help at Discharge: Family Type of Home: Mobile home Home Access: Stairs to enter Entrance Stairs-Number of Steps: 2 Entrance Stairs-Rails: Right Home Layout: One level Home Equipment: [place] - 2 wheels;Cane - quad;Shower seat Additional Comments: states he has o2 at home that he wears as needed.  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  Trial with RW and improved balance with decreased step length and narrow BOS.  o2 dropped to 83% with gait on 4 L/min via nasal canula.  INcreased back into 90s within 1 minute    ADL:    Cognition: Cognition Overall Cognitive Status: Within Functional Limits for tasks assessed Orientation Level: Oriented X4 Cognition Arousal/Alertness: Awake/alert Behavior During Therapy: WFL for tasks assessed/performed Overall Cognitive Status: Within Functional Limits for tasks assessed  Blood pressure 135/47, pulse 87, temperature 98.6 F (37 C), temperature source Oral, resp. rate 20, height 5\' 6"  (1.676 m), weight 65.227 kg (143 lb 12.8 oz), SpO2 92.00%. Physical Exam  Vitals reviewed. Constitutional: He is oriented to person, place, and time.  Small, frail appearing  HENT:  Head: Normocephalic.  Eyes: EOM are normal.  Neck: Neck supple. No thyromegaly present.  Cardiovascular:  Cardiac rate controlled  Respiratory:  Decreased breath sounds at the bases but clear to auscultation  GI: Soft. Bowel sounds  are normal. He exhibits no distension.  Neurological: He is alert and oriented to person, place, and time. No cranial nerve deficit. Coordination normal.  Follows full commands. Strength 4/5. No sensory findings.   Skin: Skin is warm and dry.    Results for orders placed during the hospital encounter of 07/23/13 (from the past 24 hour(s))  GLUCOSE, CAPILLARY     Status: Abnormal   Collection Time    07/27/13  5:29 PM      Result Value Range   Glucose-Capillary 161 (*) 70 - 99 mg/dL  PROTIME-INR     Status: Abnormal   Collection Time    07/28/13  5:05 AM      Result Value Range   Prothrombin Time 26.5 (*) 11.6 - 15.2 seconds   INR 2.54 (*) 0.00 - 1.49  CBC     Status: Abnormal   Collection Time    07/28/13  5:05 AM      Result Value Range   WBC 8.4  4.0 - 10.5 K/uL   RBC 3.13 (*) 4.22 - 5.81 MIL/uL   Hemoglobin 10.2 (*) 13.0 - 17.0 g/dL   HCT 31.8 (*) 39.0 - 52.0 %   MCV 101.6 (*) 78.0 - 100.0 fL   MCH 32.6  26.0 - 34.0 pg   MCHC 32.1  30.0 - 36.0 g/dL   RDW 14.2  11.5 - 15.5 %   Platelets 275  150 - 400 K/uL  BASIC METABOLIC PANEL     Status: Abnormal   Collection Time    07/28/13  5:05 AM      Result Value Range   Sodium 140  137 - 147 mEq/L   Potassium 4.4  3.7 - 5.3 mEq/L   Chloride 98  96 - 112 mEq/L   CO2 29  19 - 32 mEq/L   Glucose, Bld 94  70 - 99 mg/dL   BUN 25 (*) 6 - 23 mg/dL   Creatinine, Ser 0.91  0.50 - 1.35 mg/dL   Calcium 9.3  8.4 - 10.5 mg/dL   GFR calc non Af Amer 85 (*) >90 mL/min   GFR calc Af Amer >90  >90 mL/min  GLUCOSE, CAPILLARY     Status: Abnormal   Collection Time    07/28/13 11:31 AM      Result Value Range   Glucose-Capillary 115 (*) 70 -  99 mg/dL   Comment 1 Notify RN     No results found.  Assessment/Plan: Diagnosis: deconditioning related to respiratory failure 1. Does the need for close, 24 hr/day medical supervision in concert with the patient's rehab needs make it unreasonable for this patient to be served in a less intensive  setting? No 2. Co-Morbidities requiring supervision/potential complications: see above 3. Due to bladder management, bowel management, safety and skin/wound care, does the patient require 24 hr/day rehab nursing? No 4. Does the patient require coordinated care of a physician, rehab nurse, PT, OT to address physical and functional deficits in the context of the above medical diagnosis(es)? No Addressing deficits in the following areas: balance, endurance, locomotion, strength, transferring, bowel/bladder control, bathing, dressing and feeding 5. Can the patient actively participate in an intensive therapy program of at least 3 hrs of therapy per day at least 5 days per week? Potentially 6. The potential for patient to make measurable gains while on inpatient rehab is fair 7. Anticipated functional outcomes upon discharge from inpatient rehab are n/a with PT, n/a with OT, n/a with SLP. 8. Estimated rehab length of stay to reach the above functional goals is: n/a 9. Does the patient have adequate social supports to accommodate these discharge functional goals? Potentially 10. Anticipated D/C setting: Home 11. Anticipated post D/C treatments: [place] therapy 12. Overall Rehab/Functional Prognosis: excellent  RECOMMENDATIONS: This patient's condition is appropriate for continued rehabilitative care in the following setting: [facility] Patient has agreed to participate in recommended program. Yes Note that insurance prior authorization may be required for reimbursement for recommended care.  Comment: Spoke with pt and family. He's at a min assist supervision level currently. He really prefers to go home and appears to have supports to accommodate him once he's medically ready.   Meredith Staggers, MD, [facility]     07/28/2013

## 2013-07-29 DIAGNOSIS — Z7901 Long term (current) use of anticoagulants: Secondary | ICD-10-CM

## 2013-07-29 DIAGNOSIS — I10 Essential (primary) hypertension: Secondary | ICD-10-CM

## 2013-07-29 LAB — PROTIME-INR
INR: 2.81 — AB (ref 0.00–1.49)
Prothrombin Time: 28.6 seconds — ABNORMAL HIGH (ref 11.6–15.2)

## 2013-07-29 MED ORDER — WARFARIN SODIUM 5 MG PO TABS
5.0000 mg | ORAL_TABLET | Freq: Once | ORAL | Status: AC
  Administered 2013-07-29: 5 mg via ORAL
  Filled 2013-07-29: qty 1

## 2013-07-29 NOTE — Discharge Instructions (Signed)
Information on my medicine - Coumadin   (Warfarin)  This medication education was reviewed with me or my healthcare representative as part of my discharge preparation.  The pharmacist that spoke with me during my hospital stay was:  [facility]  Why was Coumadin prescribed for you? Coumadin was prescribed for you because you have a blood clot or a medical condition that can cause an increased risk of forming blood clots. Blood clots can cause serious health problems by blocking the flow of blood to the heart, lung, or brain. Coumadin can prevent harmful blood clots from forming. As a reminder your indication for Coumadin is:   Blood Clot Prevention After Heart Valve Surgery  What test will check on my response to Coumadin? While on Coumadin (warfarin) you will need to have an INR test regularly to ensure that your dose is keeping you in the desired range. The INR (international normalized ratio) number is calculated from the result of the laboratory test called prothrombin time (PT).  If an INR APPOINTMENT HAS NOT ALREADY BEEN MADE FOR YOU please schedule an appointment to have this lab work done by your health care provider within 7 days. Your INR goal is usually a number between:  2 to 3 or your provider may give you a more narrow range like 2-2.5.  Ask your health care provider during an office visit what your goal INR is.  What  do you need to  know  About  COUMADIN? Take Coumadin (warfarin) exactly as prescribed by your healthcare provider about the same time each day.  DO NOT stop taking without talking to the doctor who prescribed the medication.  Stopping without other blood clot prevention medication to take the place of Coumadin may increase your risk of developing a new clot or stroke.  Get refills before you run out.  What do you do if you miss a dose? If you miss a dose, take it as soon as you remember on the same day then continue your regularly scheduled  regimen the next day.  Do not take two doses of Coumadin at the same time.  Important Safety Information A possible side effect of Coumadin (Warfarin) is an increased risk of bleeding. You should call your healthcare provider right away if you experience any of the following:   Bleeding from an injury or your nose that does not stop.   Unusual colored urine (red or dark brown) or unusual colored stools (red or black).   Unusual bruising for unknown reasons.   A serious fall or if you hit your head (even if there is no bleeding).  Some foods or medicines interact with Coumadin (warfarin) and might alter your response to warfarin. To help avoid this:   Eat a balanced diet, maintaining a consistent amount of Vitamin K.   Notify your provider about major diet changes you plan to make.   Avoid alcohol or limit your intake to 1 drink for women and 2 drinks for men per day. (1 drink is 5 oz. wine, 12 oz. beer, or 1.5 oz. liquor.)  Make sure that ANY health care provider who prescribes medication for you knows that you are taking Coumadin (warfarin).  Also make sure the healthcare provider who is monitoring your Coumadin knows when you have started a new medication including herbals and non-prescription products.  Coumadin (Warfarin)  Major Drug Interactions  Increased Warfarin Effect Decreased Warfarin Effect  Alcohol (large quantities) Antibiotics (esp. Septra/Bactrim, Flagyl, Cipro) Amiodarone (  Cordarone) Aspirin (ASA) Cimetidine (Tagamet) Megestrol (Megace) NSAIDs (ibuprofen, naproxen, etc.) Piroxicam (Feldene) Propafenone (Rythmol SR) Propranolol (Inderal) Isoniazid (INH) Posaconazole (Noxafil) Barbiturates (Phenobarbital) Carbamazepine (Tegretol) Chlordiazepoxide (Librium) Cholestyramine (Questran) Griseofulvin Oral Contraceptives Rifampin Sucralfate (Carafate) Vitamin K   Coumadin (Warfarin) Major Herbal Interactions  Increased Warfarin Effect Decreased Warfarin Effect    Garlic Ginseng Ginkgo biloba Coenzyme Q10 Green tea St. Johns wort    Coumadin (Warfarin) FOOD Interactions  Eat a consistent number of servings per week of foods HIGH in Vitamin K (1 serving =  cup)  Collards (cooked, or boiled & drained) Kale (cooked, or boiled & drained) Mustard greens (cooked, or boiled & drained) Parsley *serving size only =  cup Spinach (cooked, or boiled & drained) Swiss chard (cooked, or boiled & drained) Turnip greens (cooked, or boiled & drained)  Eat a consistent number of servings per week of foods MEDIUM-HIGH in Vitamin K (1 serving = 1 cup)  Asparagus (cooked, or boiled & drained) Broccoli (cooked, boiled & drained, or raw & chopped) Brussel sprouts (cooked, or boiled & drained) *serving size only =  cup Lettuce, raw (green leaf, endive, romaine) Spinach, raw Turnip greens, raw & chopped   These websites have more information on Coumadin (warfarin):  FailFactory.se; VeganReport.com.au;

## 2013-07-29 NOTE — Progress Notes (Signed)
    Last saw [name] Sims, NP Subjective:  Feels better. Less SOB, no CP.    Objective:  Vital Signs in the last 24 hours: Temp:  [97 F (36.1 C)-98.3 F (36.8 C)] 98.3 F (36.8 C) (01/24 1422) Pulse Rate:  [80-92] 83 (01/24 1422) Resp:  [20] 20 (01/24 1422) BP: (120-148)/(52-75) 124/52 mmHg (01/24 1422) SpO2:  [95 %-100 %] 97 % (01/24 1422) Weight:  [144 lb 11.2 oz (65.635 kg)] 144 lb 11.2 oz (65.635 kg) (01/24 0445)  Intake/Output from previous day: 01/23 0701 - 01/24 0700 In: 1083 [P.O.:1080; I.V.:3] Out: 1670 [Urine:1670]   Physical Exam: General: Well developed, well nourished, in no acute distress. Head:  Normocephalic and atraumatic. Lungs: Clear to auscultation and percussion. Heart: Sharp S1 and S2 click.  No murmur, rubs or gallops.  Abdomen: soft, non-tender, positive bowel sounds. Extremities: No clubbing or cyanosis. No edema. Neurologic: Alert and oriented x 3.    Lab Results:  Recent Labs  07/28/13 0505  WBC 8.4  HGB 10.2*  PLT 275    Recent Labs  07/27/13 1210 07/28/13 0505  NA 138 140  K 3.9 4.4  CL 98 98  CO2 27 29  GLUCOSE 91 94  BUN 27* 25*  CREATININE 0.95 0.91     Telemetry: NSR Personally viewed.   Cardiac Studies:  ECHO: 07/24/13: - Left ventricle: The cavity size was normal. Wall thickness was normal. Systolic function was normal. The estimated ejection fraction was in the range of 50% to 55%. - Aortic valve: Normal appearing mechanical AVR - Mitral valve: Normal appearing mechanical MVR Still with extensive MAC Valve area by pressure half-time: 2.47cm^2. - Left atrium: Moderately dilated. Some shadowing from MVR tech measurements are off - Atrial septum: No defect or patent foramen ovale was identified.  Marland Kitchen amLODipine  10 mg Oral Daily  . antiseptic oral rinse  15 mL Mouth Rinse BID  . aspirin  81 mg Oral Daily  . atorvastatin  40 mg Oral q1800  . furosemide  40 mg Oral Q8H  . ipratropium-albuterol  3 mL  Nebulization Q6H WA  . metoprolol tartrate  100 mg Oral BID  . sodium chloride  3 mL Intravenous Q12H  . warfarin  5 mg Oral ONCE-1800  . Warfarin - Pharmacist Dosing Inpatient   Does not apply q1800    Assessment/Plan:    1) AFIB - now NSR following cardioversion. No changes in meds.   2) Chronic anticoagulation - coumadin (2.5-3.5) Currently at goal. MVR/AVR  3) Acute diastolic HF - improved post CV. Lasix. -7 liters net.   4) NSTEMI - type 2, demand ischemia in setting of acute resp failure. Will likely obtain NUC stress as outpatient for further risk stratification.   5) Mechanical MVR/AVR - good function.  Anticipate DC tomorrow hopefully.  [name], [name] 07/29/2013, 2:49 PM

## 2013-07-29 NOTE — Progress Notes (Signed)
Patient alert and oriented x4 throughout shift; family at bedside for afternoon.  Able to wean patient to 2L via [place] this shift, O2 saturation remains stable at 97%.  Vital signs stable.  Patient states he has no questions or concerns at this time.  Will continue to monitor.

## 2013-07-29 NOTE — Progress Notes (Signed)
Pt. Alert and oriented this am. Resting in bed. No distress or discomfort noted during the night. PRN cough medication ordered and administered. Pt. Denies pain. Blood pressure 123/61, pulse 81, temperature 98.1 F (36.7 C), temperature source Oral, resp. rate 20, height 5\' 6"  (1.676 m), weight 65.635 kg (144 lb 11.2 oz), SpO2 98.00%. RN will continue to monitor pt. For changes in condition. Millicent Blazejewski, Katherine Roan

## 2013-07-29 NOTE — Progress Notes (Signed)
ANTICOAGULATION CONSULT NOTE - Follow Up Consult  Pharmacy Consult for Coumadin  Indication: Afib, AVR, MVR   No Known Allergies  Patient Measurements: Height: 5\' 6"  (167.6 cm) Weight: 144 lb 11.2 oz (65.635 kg) IBW/kg (Calculated) : 63.8  Vital Signs: Temp: 98.1 F (36.7 C) (01/24 0445) Temp src: Oral (01/24 0445) BP: 123/61 mmHg (01/24 0445) Pulse Rate: 81 (01/24 0445)  Labs:  Recent Labs  07/26/13 1530 07/27/13 0735 07/27/13 1210 07/28/13 0505 07/29/13 0350  HGB  --   --   --  10.2*  --   HCT  --   --   --  31.8*  --   PLT  --   --   --  275  --   LABPROT 26.7* 23.9*  --  26.5* 28.6*  INR 2.57* 2.22*  --  2.54* 2.81*  CREATININE 1.14  --  0.95 0.91  --     Estimated Creatinine Clearance: 70.1 ml/min (by C-G formula based on Cr of 0.91).   Assessment: 68 year old male on chronic Coumadin therapy for Afib, MVR, AVR admitted on 1/18 for respiratory distress and brief cardiac arrest. Pharmacy consulted to dose warfarin  Patient's Coumadin dose at home is 7.5 mg MWF, and 5 mg all other days. Per wife, last dose taken 1/17 evening. Doses held 1/18 to 1/20 for elevated INRs.   Anticoagulation: Hx afib, MVR/AVR 2003 ([address] [facility]), on Coumadin PTA. Goes to [facility] (last note 12/29), INR goal 2.5-3.5.Today's INR at goal. Patient continues to have no signs of bleeding at this time.   Goal of Therapy:  INR 2.5-3.5 Monitor platelets by anticoagulation protocol: Yes   Plan:  1. Continue Coumadin 5 mg PO X 1 tonight  2. Monitor daily INR, CBC, and signs of bleeding   Thank you for allowing pharmacy to be a part of this patients care team.  [name] Pharm.D., BCPS Clinical Pharmacist 07/29/2013 11:12 AM Pager: [phone] Phone: [phone]

## 2013-07-29 NOTE — Progress Notes (Signed)
[facility] PROGRESS NOTE Interim History: 68 year old male with a past medical history significant for coronary artery bypass graft,  with a drug-eluting stent in 01/2535, chronic diastolic heart failure, mechanical aortic and mitral valve replacements, atrial fibrillation, nonsustained ventricular tachycardia, supraventricular tachycardia,  And  severe COPD.  In mid-July, he was admitted to [facility] with a NSTEMI and underwent DES to the mid RCA. He was noted to be noncompliant with a low-sodium diet. Presented on 1.26.2014 to [facility] with worsening dyspnea. Was found to be hypoxic felt secondary to pulmonary edema requiring intubation. During intubation he became hypotensive, brief PEA. Dopamine started, now off. Patient is intubated/sedated, unable to provide history.   Assessment/Plan: Acute respiratory failure in the setting brief PEA due to acute on chronic diastolic heart failure/acute pulmonary edema/NSTEMI in the setting of demand ischemia and COPD: - Estimated dry weight 65 kg. - Was diuresis per cardiology, electrolytes monitor and repleted. - On metoprolol,  On lasix oral - Continue statin, ASA 81. Cardiology will consider Cardiolite to evaluate for ischemia after discharge.  - Elevation in trops like due to demand ischemia. - awaiting CIR evaluation.  Atrial fibrillation with RVR:  - He went into atrial fibrillation yesterday. He has been therapeutic on warfarin (INR 2.2 today). Will attempt 1.22.2014. - now in [place]. Cont ASA and coumadin.  Chronic hyponatremia  - due to heart failure.  Anemia of chronic disease: - hbg stable.  Code Status: full Family Communication: none  Disposition Plan: inpatinet    Consultants: Cardiology PCCM  Procedures: Echo with EF 50-55%, consistent with prior 1/18:intubated with brief arrest during intubation. 1/22 DCCV in ICU. LINES / TUBES:  ETT 1/18>>>1/20  OGT 1/18 >>>1/20  Foley 1/18 >>>1/21  L IJ 1/18 >>>1/21 BC x 2  1/18>>>1/2 bottle prelim positive with gram positive clusters  Urine 1/18>>>no growth  Sputum 1/18>>>strep pneumo  [name] 1/21 >>>   Antibiotics:  none  HPI/Subjective: No complains  Objective: Filed Vitals:   07/28/13 2032 07/28/13 2217 07/29/13 0445 07/29/13 0837  BP: 120/63 148/75 123/61   Pulse: 80 92 81   Temp: 97 F (36.1 C)  98.1 F (36.7 C)   TempSrc: Oral  Oral   Resp: 20  20   Height:      Weight:   65.635 kg (144 lb 11.2 oz)   SpO2: 100%  95% 98%    Intake/Output Summary (Last 24 hours) at 07/29/13 0840 Last data filed at 07/29/13 0811  Gross per 24 hour  Intake   1083 ml  Output   1970 ml  Net   -887 ml   Filed Weights   07/27/13 2215 07/28/13 0513 07/29/13 0445  Weight: 66.4 kg (146 lb 6.2 oz) 65.227 kg (143 lb 12.8 oz) 65.635 kg (144 lb 11.2 oz)    Exam:  General: Alert, awake, oriented x3, in no acute distress.  HEENT: No bruits, no goiter.  Heart: Regular rate and rhythm, without murmurs, rubs, gallops.  Lungs: Good air movement, bilateral air movement.  Abdomen: Soft, nontender, nondistended, positive bowel sounds.  Neuro: Grossly intact, nonfocal.   Data Reviewed: Basic Metabolic Panel:  Recent Labs Lab 07/24/13 0300 07/25/13 0415 07/26/13 0519 07/26/13 1530 07/27/13 1210 07/28/13 0505  NA 135* 138 140 137 138 140  K 4.5 3.9 4.0 4.1 3.9 4.4  CL 95* 98 99 97 98 98  CO2 25 28 27 29 27 29   GLUCOSE 162* 158* 103* 132* 91 94  BUN 32* 25* 26* 32* 27*  25*  CREATININE 1.21 0.85 0.97 1.14 0.95 0.91  CALCIUM 8.5 8.7 8.9 8.7 9.0 9.3  MG 1.8  --   --   --   --   --    Liver Function Tests:  Recent Labs Lab 07/23/13 1008 07/24/13 0300  AST 52* 52*  ALT 30 26  ALKPHOS 142* 103  BILITOT 0.9 0.5  PROT 7.4 6.0  ALBUMIN 3.6 2.9*   No results found for this basename: LIPASE, AMYLASE,  in the last 168 hours No results found for this basename: AMMONIA,  in the last 168 hours CBC:  Recent Labs Lab 07/23/13 1008 07/24/13 0300  07/25/13 0415 07/26/13 0519 07/28/13 0505  WBC 7.8 12.7* 9.2 7.5 8.4  NEUTROABS 6.5  --   --   --   --   HGB 12.3* 10.3* 11.0* 9.8* 10.2*  HCT 35.8* 30.1* 32.7* 29.7* 31.8*  MCV 102.0* 97.7 100.3* 101.4* 101.6*  PLT 325 218 208 232 275   Cardiac Enzymes:  Recent Labs Lab 07/23/13 1008 07/23/13 1353 07/23/13 1852 07/24/13 0052 07/24/13 0800  TROPONINI <0.30 1.32* 4.35* 3.22* 3.06*   BNP (last 3 results)  Recent Labs  01/27/13 0705 05/29/13 0826 07/23/13 1008  PROBNP 5719.0* 6106.0* 8081.0*   CBG:  Recent Labs Lab 07/26/13 2356 07/27/13 0753 07/27/13 1135 07/27/13 1729 07/28/13 1131  GLUCAP 98 103* 94 161* 115*    Recent Results (from the past 240 hour(s))  CULTURE, BLOOD (ROUTINE X 2)     Status: None   Collection Time    07/23/13 10:09 AM      Result Value Range Status   Specimen Description BLOOD LEFT HAND   Final   Special Requests BOTTLES DRAWN AEROBIC ONLY 8CC BOTTLE   Final   Culture NO GROWTH 5 DAYS   Final   Report Status 07/28/2013 FINAL   Final  CULTURE, BLOOD (ROUTINE X 2)     Status: None   Collection Time    07/23/13 10:10 AM      Result Value Range Status   Specimen Description BLOOD LEFT ARM   Final   Special Requests     Final   Value: BOTTLES DRAWN AEROBIC AND ANAEROBIC 6CC EACH BOTTLE   Culture NO GROWTH 5 DAYS   Final   Report Status 07/28/2013 FINAL   Final  MRSA PCR SCREENING     Status: None   Collection Time    07/23/13  1:02 PM      Result Value Range Status   MRSA by PCR NEGATIVE  NEGATIVE Final   Comment:            The GeneXpert MRSA Assay (FDA     approved for NASAL specimens     only), is one component of a     comprehensive MRSA colonization     surveillance program. It is not     intended to diagnose MRSA     infection nor to guide or     monitor treatment for     MRSA infections.  CULTURE, RESPIRATORY (NON-EXPECTORATED)     Status: None   Collection Time    07/23/13  3:17 PM      Result Value Range Status    Specimen Description TRACHEAL ASPIRATE   Final   Special Requests NONE   Final   Gram Stain     Final   Value: MODERATE WBC PRESENT, PREDOMINANTLY PMN     NO SQUAMOUS EPITHELIAL CELLS SEEN  MODERATE GRAM POSITIVE RODS     FEW GRAM POSITIVE COCCI     IN PAIRS   Culture     Final   Value: ABUNDANT STREPTOCOCCUS PNEUMONIAE     MODERATE MORAXELLA CATARRHALIS(BRANHAMELLA)     Note: BETA LACTAMASE POSITIVE     Performed at [facility]   Report Status 07/27/2013 FINAL   Final   Organism ID, Bacteria STREPTOCOCCUS PNEUMONIAE   Final  URINE CULTURE     Status: None   Collection Time    07/23/13  8:45 PM      Result Value Range Status   Specimen Description URINE, CATHETERIZED   Final   Special Requests none Normal   Final   Culture  Setup Time     Final   Value: 07/23/2013 21:32     Performed at [place]     Final   Value: NO GROWTH     Performed at [facility]   Culture     Final   Value: NO GROWTH     Performed at [facility]   Report Status 07/24/2013 FINAL   Final  CULTURE, BLOOD (ROUTINE X 2)     Status: None   Collection Time    07/24/13 10:45 PM      Result Value Range Status   Specimen Description BLOOD HAND LEFT   Final   Special Requests BOTTLES DRAWN AEROBIC ONLY 5CC   Final   Culture  Setup Time     Final   Value: 07/25/2013 03:15     Performed at [facility]   Culture     Final   Value:        BLOOD CULTURE RECEIVED NO GROWTH TO DATE CULTURE WILL BE HELD FOR 5 DAYS BEFORE ISSUING A FINAL NEGATIVE REPORT     Performed at [facility]   Report Status PENDING   Incomplete  CULTURE, BLOOD (ROUTINE X 2)     Status: None   Collection Time    07/24/13 10:50 PM      Result Value Range Status   Specimen Description BLOOD HAND LEFT   Final   Special Requests BOTTLES DRAWN AEROBIC ONLY 5CC   Final   Culture  Setup Time     Final   Value: 07/25/2013 03:15     Performed at [facility]    Culture     Final   Value: STAPHYLOCOCCUS SPECIES (COAGULASE NEGATIVE)     Note: THE SIGNIFICANCE OF ISOLATING THIS ORGANISM FROM A SINGLE SET OF BLOOD CULTURES WHEN MULTIPLE SETS ARE DRAWN IS UNCERTAIN. PLEASE NOTIFY THE MICROBIOLOGY DEPARTMENT WITHIN ONE WEEK IF SPECIATION AND SENSITIVITIES ARE REQUIRED.     1244 Note: Gram Stain Report Called to,Read Back By and Verified With: Arlyss Gandy 07/26/13 [facility]     Performed at [facility]   Report Status 07/27/2013 FINAL   Final  CULTURE, BLOOD (ROUTINE X 2)     Status: None   Collection Time    07/26/13  7:35 AM      Result Value Range Status   Specimen Description BLOOD LEFT ANTECUBITAL   Final   Special Requests BOTTLES DRAWN AEROBIC AND ANAEROBIC 10CC   Final   Culture  Setup Time     Final   Value: 07/26/2013 13:26     Performed at [facility]   Culture     Final   Value:  BLOOD CULTURE RECEIVED NO GROWTH TO DATE CULTURE WILL BE HELD FOR 5 DAYS BEFORE ISSUING A FINAL NEGATIVE REPORT     Performed at [facility]   Report Status PENDING   Incomplete  CULTURE, BLOOD (ROUTINE X 2)     Status: None   Collection Time    07/26/13  7:40 AM      Result Value Range Status   Specimen Description BLOOD LEFT HAND   Final   Special Requests BOTTLES DRAWN AEROBIC ONLY 10CC   Final   Culture  Setup Time     Final   Value: 07/26/2013 13:26     Performed at [facility]   Culture     Final   Value:        BLOOD CULTURE RECEIVED NO GROWTH TO DATE CULTURE WILL BE HELD FOR 5 DAYS BEFORE ISSUING A FINAL NEGATIVE REPORT     Performed at [facility]   Report Status PENDING   Incomplete     Studies: No results found.  Scheduled Meds: . amLODipine  10 mg Oral Daily  . antiseptic oral rinse  15 mL Mouth Rinse BID  . aspirin  81 mg Oral Daily  . atorvastatin  40 mg Oral q1800  . furosemide  40 mg Oral Q8H  . ipratropium-albuterol  3 mL Nebulization Q6H WA  . metoprolol tartrate  100 mg Oral BID  .  sodium chloride  3 mL Intravenous Q12H  . Warfarin - Pharmacist Dosing Inpatient   Does not apply q1800   Continuous Infusions: . sodium chloride 250 mL (07/26/13 1556)     Venetia Constable Marguarite Arbour  [facility] Pager [phone]. If 8PM-8AM, please contact night-coverage at [email or web address], password [facility] 07/29/2013, 8:40 AM  LOS: 6 days

## 2013-07-30 ENCOUNTER — Encounter (HOSPITAL_COMMUNITY): Payer: Self-pay | Admitting: Physician Assistant

## 2013-07-30 DIAGNOSIS — I4891 Unspecified atrial fibrillation: Secondary | ICD-10-CM

## 2013-07-30 LAB — PROTIME-INR
INR: 2.55 — ABNORMAL HIGH (ref 0.00–1.49)
Prothrombin Time: 26.6 seconds — ABNORMAL HIGH (ref 11.6–15.2)

## 2013-07-30 MED ORDER — FUROSEMIDE 10 MG/ML IJ SOLN
INTRAMUSCULAR | Status: AC
  Filled 2013-07-30: qty 4

## 2013-07-30 MED ORDER — FUROSEMIDE 10 MG/ML IJ SOLN
60.0000 mg | Freq: Once | INTRAMUSCULAR | Status: AC
  Administered 2013-07-30: 60 mg via INTRAVENOUS

## 2013-07-30 MED ORDER — FUROSEMIDE 40 MG PO TABS
60.0000 mg | ORAL_TABLET | Freq: Two times a day (BID) | ORAL | Status: DC
  Filled 2013-07-30 (×2): qty 1

## 2013-07-30 MED ORDER — FUROSEMIDE 20 MG PO TABS: 60.0000 mg | ORAL_TABLET | Freq: Two times a day (BID) | ORAL | Status: DC

## 2013-07-30 NOTE — Discharge Summary (Signed)
Physician Discharge Summary  [name] XIP:[id number] DOB: 03-Jun-1946 DOA: 07/23/2013  PCP: Alonza Bogus, MD  Admit date: 07/23/2013 Discharge date: 07/30/2013  Time spent: 40 minutes  Recommendations for Outpatient Follow-up:  1. Follow up with cardiology in 1 week. 2. Check a b-met and weight. BNP    Component Value Date/Time   PROBNP 8081.0* 07/23/2013 1008   Filed Weights   07/28/13 0513 07/29/13 0445 07/30/13 0404  Weight: 65.227 kg (143 lb 12.8 oz) 65.635 kg (144 lb 11.2 oz) 64.774 kg (142 lb 12.8 oz)     Discharge Diagnoses:  Principal Problem:   Acute respiratory failure with hypoxia Active Problems:   MITRAL VALVE REPLACEMENT, HX OF   AORTIC VALVE REPLACEMENT, HX OF   Chronic anticoagulation   Coronary atherosclerosis of unspecified type of vessel, native or graft   Hypertension   NSTEMI (non-ST elevated myocardial infarction)   Acute on chronic diastolic CHF (congestive heart failure), NYHA class 4   Atrial fibrillation   Discharge Condition: stable  Diet recommendation: heart healthy   History of present illness:  68 yo male with hx COPD, CHF, CAD, St [employer] valve replacement on coumadin presented 1/18 to [facility] with severe respiratory distress requiring intubation. Brief cardiac arrest prior to intubation. Tx to [facility].   Hospital Course:  Acute respiratory failure in the setting brief PEA due to acute on chronic diastolic heart failure/acute pulmonary edema/NSTEMI in the setting of demand ischemia and COPD:  - Estimated dry weight 65 kg.  - intubated on 1.18 due PEA, extubated 1.20 after diuresed. Transfer to telemetry. - Was diuresis per cardiology, electrolytes monitor and repleted.  - On metoprolol, On lasix oral $RemoveBe'60mg'dejxqVyYO$  BID at home. - Continue statin, ASA 81. Cardiology will consider Cardiolite to evaluate for ischemia after discharge.  - Elevation in trops like due to demand ischemia.  - ? Due to non complaince. - pt evaluated him  recommended H/H.  Atrial fibrillation with RVR:  - He went into atrial fibrillation on the day of admission. -  He has been therapeutic on warfarin (INR 2.2 today). Will DCCV 1.22.2014.  - now in [place]. Cont ASA and coumadin.   Chronic hyponatremia  - due to heart failure.   Anemia of chronic disease:  - hbg stable.   Procedures:  Echo with EF 50-55%, consistent with prior  1/18:intubated with brief arrest during intubation.  1/22 DCCV in ICU.  LINES / TUBES:  ETT 1/18>>>1/20  OGT 1/18 >>>1/20  Foley 1/18 >>>1/21  L IJ 1/18 >>>1/21  BC x 2 1/18>>>1/2 bottle prelim positive with gram positive clusters  Urine 1/18>>>no growth  Sputum 1/18>>>strep pneumo  [facility] 1/21 >>> negative    Consultations: Cardiology  PCCM   Discharge Exam: Filed Vitals:   07/30/13 0404  BP: 123/55  Pulse: 73  Temp: 98.3 F (36.8 C)  Resp: 20    General: A&O x3 Cardiovascular: RRR Respiratory: good air movement CTA B/L  Discharge Instructions      Discharge Orders   Future Appointments Provider Department Dept Phone   08/03/2013 2:40 PM [facility] [facility] [place] [phone]   08/03/2013 2:50 PM Lendon Colonel, NP [facility] [place] [phone]   09/04/2013 1:00 PM Arnoldo Lenis, MD [employer] [phone]   Future Orders Complete By Expires   Diet - low sodium heart healthy  As directed    Increase activity slowly  As directed        Medication List  STOP taking these medications       torsemide 20 MG tablet  Commonly known as:  DEMADEX      TAKE these medications       acetaminophen 325 MG tablet  Commonly known as:  TYLENOL  Take 2 tablets (650 mg total) by mouth every 6 (six) hours as needed. For pain     amLODipine 10 MG tablet  Commonly known as:  NORVASC  Take 10 mg by mouth at bedtime.     atorvastatin 40 MG tablet  Commonly known as:  LIPITOR  Take 40 mg by mouth daily.     clopidogrel 75 MG tablet  Commonly  known as:  PLAVIX  Take 1 tablet (75 mg total) by mouth daily with breakfast.     ferrous sulfate 325 (65 FE) MG EC tablet  Take 325 mg by mouth daily with breakfast.     folic acid 1 MG tablet  Commonly known as:  FOLVITE  Take 1 tablet (1 mg total) by mouth daily.     furosemide 20 MG tablet  Commonly known as:  LASIX  Take 3 tablets (60 mg total) by mouth 2 (two) times daily.     HYDROcodone-acetaminophen 10-325 MG per tablet  Commonly known as:  NORCO  Take 1 tablet by mouth every 6 (six) hours as needed (pain).     levalbuterol 0.63 MG/3ML nebulizer solution  Commonly known as:  XOPENEX  Take 3 mLs (0.63 mg total) by nebulization every 6 (six) hours as needed for wheezing or shortness of breath.     magnesium oxide 400 (241.3 MG) MG tablet  Commonly known as:  MAG-OX  Take 1 tablet (400 mg total) by mouth daily.     metoprolol 100 MG tablet  Commonly known as:  LOPRESSOR  Take 1 tablet (100 mg total) by mouth 2 (two) times daily.     multivitamin with minerals Tabs tablet  Take 1 tablet by mouth daily.     nitroGLYCERIN 0.4 MG SL tablet  Commonly known as:  NITROSTAT  Place 1 tablet (0.4 mg total) under the tongue every 5 (five) minutes x 3 doses as needed for chest pain.     pantoprazole 40 MG tablet  Commonly known as:  PROTONIX  Take 1 tablet (40 mg total) by mouth daily.     potassium chloride SA 20 MEQ tablet  Commonly known as:  K-DUR,KLOR-CON  Take 20 mEq by mouth daily.     thiamine 100 MG tablet  Take 1 tablet (100 mg total) by mouth daily.     warfarin 5 MG tablet  Commonly known as:  COUMADIN  Take 5-7.5 mg by mouth daily. Take 1 1/2 tablets (7.5 mg) on Monday, Wednesday, Friday, take 1 tablet (5 mg) on Sunday, Tuesday, Thursday and Saturday       No Known Allergies Follow-up Information   Follow up with Joni Reining, NP On 08/03/2013. (Coumadin check and visit at 2:50 pm.)    Specialty:  Nurse Practitioner   Contact information:   [address] [place] [address] [phone]        The results of significant diagnostics from this hospitalization (including imaging, microbiology, ancillary and laboratory) are listed below for reference.    Significant Diagnostic Studies: Dg Chest Port 1 View  07/26/2013   CLINICAL DATA:  Acute respiratory failure  EXAM: PORTABLE CHEST - 1 VIEW  COMPARISON:  July 24, 2013  FINDINGS: Endotracheal tube and nasogastric tube have  been removed. The left jugular catheter tip is in the left innominate vein near the junction with the superior vena cava. No pneumothorax. There is increase in interstitial and patchy alveolar edema compared to 2 days prior. There is cardiomegaly with small effusions. There are prosthetic mitral and aortic valves. No adenopathy.  IMPRESSION: Congestive heart failure with increased edema.  No pneumothorax.   Electronically Signed   By: Lowella Grip M.D.   On: 07/26/2013 07:33   Dg Chest Port 1 View  07/24/2013   CLINICAL DATA:  Ventilator  EXAM: PORTABLE CHEST - 1 VIEW  COMPARISON:  07/24/2013  FINDINGS: Endotracheal tube remains in good position. NG tube tip not well seen but may be in the distal esophagus. Left jugular catheter tip in the SVC in unchanged. No pneumothorax  Diffuse bilateral airspace disease shows mild interval improvement. Possible edema.  IMPRESSION: Mild improvement in diffuse bilateral airspace disease consistent with edema.   Electronically Signed   By: Franchot Gallo M.D.   On: 07/24/2013 14:13   Dg Chest Port 1 View  07/24/2013   CLINICAL DATA:  Acute respiratory failure. On ventilator. Congestive heart failure. Coronary artery disease.  EXAM: PORTABLE CHEST - 1 VIEW  COMPARISON:  07/23/2013  FINDINGS: Support lines and tubes in appropriate position. Diffuse bilateral airspace disease and probable layering bilateral pleural effusion show no significant change. Cardiomegaly is stable.  IMPRESSION: No significant change compared with prior  exam.   Electronically Signed   By: Earle Gell M.D.   On: 07/24/2013 07:17   Dg Chest Port 1 View  07/23/2013   CLINICAL DATA:  Central line placement.  EXAM: PORTABLE CHEST - 1 VIEW  COMPARISON:  Chest x-ray 08/25/2013.  FINDINGS: An endotracheal tube is in place with tip 4.8 cm above the carina. A nasogastric tube is seen extending into the stomach, however, the tip of the nasogastric tube extends below the lower margin of the image. There is a left-sided internal jugular central venous catheter with tip terminating in the proximal superior vena cava. Lung volumes are slightly low. There continues to be widespread interstitial and airspace disease throughout the lungs bilaterally, with some cephalization of the pulmonary vasculature, most compatible with moderate pulmonary edema. Overall, this is slightly improved compared to yesterday's examination. Probable trace bilateral pleural effusions. Heart size remains mildly enlarged. The patient is rotated to the right on today's exam, resulting in distortion of the mediastinal contours and reduced diagnostic sensitivity and specificity for mediastinal pathology. Atherosclerosis in the thoracic aorta. Status post median sternotomy for CABG and mechanical mitral and aortic valve replacements.  IMPRESSION: 1. Overall, there appears to be slightly improved aeration compared to yesterday's examination, however, there is still evidence for moderate congestive heart failure. 2. Support apparatus and postoperative changes, as above.   Electronically Signed   By: Vinnie Langton M.D.   On: 07/23/2013 14:22   Dg Chest Portable 1 View  07/23/2013   CLINICAL DATA:  Endotracheal tube placement  EXAM: PORTABLE CHEST - 1 VIEW  COMPARISON:  05/29/2013  FINDINGS: Moderate cardiac enlargement. Status post CABG and replacement cardiac valve placement. Endotracheal tube is seen with tip 3.9 cm above the carina. There is vascular congestion in. There is moderate to severe  interstitial prominence with more confluent alveolar opacities in the mid lung zones, right worse than left.  IMPRESSION: Endotracheal tube as described. Congestive heart failure with moderate to severe pulmonary edema.   Electronically Signed   By: Elodia Florence.D.  On: 07/23/2013 10:10    Microbiology: Recent Results (from the past 240 hour(s))  CULTURE, BLOOD (ROUTINE X 2)     Status: None   Collection Time    07/23/13 10:09 AM      Result Value Range Status   Specimen Description BLOOD LEFT HAND   Final   Special Requests BOTTLES DRAWN AEROBIC ONLY 8CC BOTTLE   Final   Culture NO GROWTH 5 DAYS   Final   Report Status 07/28/2013 FINAL   Final  CULTURE, BLOOD (ROUTINE X 2)     Status: None   Collection Time    07/23/13 10:10 AM      Result Value Range Status   Specimen Description BLOOD LEFT ARM   Final   Special Requests     Final   Value: BOTTLES DRAWN AEROBIC AND ANAEROBIC 6CC EACH BOTTLE   Culture NO GROWTH 5 DAYS   Final   Report Status 07/28/2013 FINAL   Final  MRSA PCR SCREENING     Status: None   Collection Time    07/23/13  1:02 PM      Result Value Range Status   MRSA by PCR NEGATIVE  NEGATIVE Final   Comment:            The GeneXpert MRSA Assay (FDA     approved for NASAL specimens     only), is one component of a     comprehensive MRSA colonization     surveillance program. It is not     intended to diagnose MRSA     infection nor to guide or     monitor treatment for     MRSA infections.  CULTURE, RESPIRATORY (NON-EXPECTORATED)     Status: None   Collection Time    07/23/13  3:17 PM      Result Value Range Status   Specimen Description TRACHEAL ASPIRATE   Final   Special Requests NONE   Final   Gram Stain     Final   Value: MODERATE WBC PRESENT, PREDOMINANTLY PMN     NO SQUAMOUS EPITHELIAL CELLS SEEN     MODERATE GRAM POSITIVE RODS     FEW GRAM POSITIVE COCCI     IN PAIRS   Culture     Final   Value: ABUNDANT STREPTOCOCCUS PNEUMONIAE     MODERATE  MORAXELLA CATARRHALIS(BRANHAMELLA)     Note: BETA LACTAMASE POSITIVE     Performed at [facility]   Report Status 07/27/2013 FINAL   Final   Organism ID, Bacteria STREPTOCOCCUS PNEUMONIAE   Final  URINE CULTURE     Status: None   Collection Time    07/23/13  8:45 PM      Result Value Range Status   Specimen Description URINE, CATHETERIZED   Final   Special Requests none Normal   Final   Culture  Setup Time     Final   Value: 07/23/2013 21:32     Performed at [place]     Final   Value: NO GROWTH     Performed at [facility]   Culture     Final   Value: NO GROWTH     Performed at [facility]   Report Status 07/24/2013 FINAL   Final  CULTURE, BLOOD (ROUTINE X 2)     Status: None   Collection Time    07/24/13 10:45 PM      Result Value Range Status  Specimen Description BLOOD HAND LEFT   Final   Special Requests BOTTLES DRAWN AEROBIC ONLY 5CC   Final   Culture  Setup Time     Final   Value: 07/25/2013 03:15     Performed at [facility]   Culture     Final   Value:        BLOOD CULTURE RECEIVED NO GROWTH TO DATE CULTURE WILL BE HELD FOR 5 DAYS BEFORE ISSUING A FINAL NEGATIVE REPORT     Performed at [facility]   Report Status PENDING   Incomplete  CULTURE, BLOOD (ROUTINE X 2)     Status: None   Collection Time    07/24/13 10:50 PM      Result Value Range Status   Specimen Description BLOOD HAND LEFT   Final   Special Requests BOTTLES DRAWN AEROBIC ONLY 5CC   Final   Culture  Setup Time     Final   Value: 07/25/2013 03:15     Performed at [facility]   Culture     Final   Value: STAPHYLOCOCCUS SPECIES (COAGULASE NEGATIVE)     Note: THE SIGNIFICANCE OF ISOLATING THIS ORGANISM FROM A SINGLE SET OF BLOOD CULTURES WHEN MULTIPLE SETS ARE DRAWN IS UNCERTAIN. PLEASE NOTIFY THE MICROBIOLOGY DEPARTMENT WITHIN ONE WEEK IF SPECIATION AND SENSITIVITIES ARE REQUIRED.     1244 Note: Gram Stain Report Called  to,Read Back By and Verified With: Arlyss Gandy 07/26/13 [facility]     Performed at [facility]   Report Status 07/27/2013 FINAL   Final  CULTURE, BLOOD (ROUTINE X 2)     Status: None   Collection Time    07/26/13  7:35 AM      Result Value Range Status   Specimen Description BLOOD LEFT ANTECUBITAL   Final   Special Requests BOTTLES DRAWN AEROBIC AND ANAEROBIC 10CC   Final   Culture  Setup Time     Final   Value: 07/26/2013 13:26     Performed at [facility]   Culture     Final   Value:        BLOOD CULTURE RECEIVED NO GROWTH TO DATE CULTURE WILL BE HELD FOR 5 DAYS BEFORE ISSUING A FINAL NEGATIVE REPORT     Performed at [facility]   Report Status PENDING   Incomplete  CULTURE, BLOOD (ROUTINE X 2)     Status: None   Collection Time    07/26/13  7:40 AM      Result Value Range Status   Specimen Description BLOOD LEFT HAND   Final   Special Requests BOTTLES DRAWN AEROBIC ONLY 10CC   Final   Culture  Setup Time     Final   Value: 07/26/2013 13:26     Performed at [facility]   Culture     Final   Value:        BLOOD CULTURE RECEIVED NO GROWTH TO DATE CULTURE WILL BE HELD FOR 5 DAYS BEFORE ISSUING A FINAL NEGATIVE REPORT     Performed at [facility]   Report Status PENDING   Incomplete     Labs: Basic Metabolic Panel:  Recent Labs Lab 07/24/13 0300 07/25/13 0415 07/26/13 0519 07/26/13 1530 07/27/13 1210 07/28/13 0505  NA 135* 138 140 137 138 140  K 4.5 3.9 4.0 4.1 3.9 4.4  CL 95* 98 99 97 98 98  CO2 $Re'25 28 27 29 27 29  'Olk$ GLUCOSE 162* 158*  103* 132* 91 94  BUN 32* 25* 26* 32* 27* 25*  CREATININE 1.21 0.85 0.97 1.14 0.95 0.91  CALCIUM 8.5 8.7 8.9 8.7 9.0 9.3  MG 1.8  --   --   --   --   --    Liver Function Tests:  Recent Labs Lab 07/23/13 1008 07/24/13 0300  AST 52* 52*  ALT 30 26  ALKPHOS 142* 103  BILITOT 0.9 0.5  PROT 7.4 6.0  ALBUMIN 3.6 2.9*   No results found for this basename: LIPASE, AMYLASE,  in the last 168  hours No results found for this basename: AMMONIA,  in the last 168 hours CBC:  Recent Labs Lab 07/23/13 1008 07/24/13 0300 07/25/13 0415 07/26/13 0519 07/28/13 0505  WBC 7.8 12.7* 9.2 7.5 8.4  NEUTROABS 6.5  --   --   --   --   HGB 12.3* 10.3* 11.0* 9.8* 10.2*  HCT 35.8* 30.1* 32.7* 29.7* 31.8*  MCV 102.0* 97.7 100.3* 101.4* 101.6*  PLT 325 218 208 232 275   Cardiac Enzymes:  Recent Labs Lab 07/23/13 1008 07/23/13 1353 07/23/13 1852 07/24/13 0052 07/24/13 0800  TROPONINI <0.30 1.32* 4.35* 3.22* 3.06*   BNP: BNP (last 3 results)  Recent Labs  01/27/13 0705 05/29/13 0826 07/23/13 1008  PROBNP 5719.0* 6106.0* 8081.0*   CBG:  Recent Labs Lab 07/26/13 2356 07/27/13 0753 07/27/13 1135 07/27/13 1729 07/28/13 1131  GLUCAP 98 103* 94 161* 115*       Signed:  FELIZ ORTIZ, ABRAHAM  [facility] 07/30/2013, 9:30 AM

## 2013-07-30 NOTE — Progress Notes (Addendum)
  Progress Note  Primary Cardiologist:  Dr. Carlyle Dolly   Subjective:  Denies CP or dyspnea.   Objective:  Filed Vitals:   07/29/13 1422 07/29/13 2042 07/29/13 2049 07/30/13 0404  BP: 124/52  126/58 123/55  Pulse: 83  79 73  Temp: 98.3 F (36.8 C)  98.1 F (36.7 C) 98.3 F (36.8 C)  TempSrc: Oral  Oral Oral  Resp: 20  20 20   Height:      Weight:    142 lb 12.8 oz (64.774 kg)  SpO2: 97% 92% 97% 95%    Intake/Output from previous day:  Intake/Output Summary (Last 24 hours) at 07/30/13 0739 Last data filed at 07/30/13 5027  Gross per 24 hour  Intake   1563 ml  Output   1800 ml  Net   -237 ml   Tele:  NSR  PHYSICAL EXAM: No acute distress Neck: no JVD Cardiac:  mechanical S1, S2; RRR; no murmur Lungs:  Decreased breath sounds bilaterally, no wheezing, rhonchi or rales Abd: soft, nontender, no hepatomegaly Ext: no edema Skin: warm and dry Neuro:  CNs 2-12 intact, no focal abnormalities noted   Lab Results:  Basic Metabolic Panel:  Recent Labs  07/27/13 1210 07/28/13 0505  NA 138 140  K 3.9 4.4  CL 98 98  CO2 27 29  GLUCOSE 91 94  BUN 27* 25*  CREATININE 0.95 0.91  CALCIUM 9.0 9.3    CBC:  Recent Labs  07/28/13 0505  WBC 8.4  HGB 10.2*  HCT 31.8*  MCV 101.6*  PLT 275      Assessment/Plan:   1. Acute on Chronic Diastolic:  Compensated.  Probable d/c today.  Would change Lasix to 60 bid for better compliance.  He has follow up this week in [place].  He should keep this appt as a 7 day TOC visit.   2. Atrial Fibrillation:  Maintaining NSR.  Continue coumadin. 3. Non-STEMI:  Probable Type 2.  Will need outpatient stress test for risk stratification. 4. Mechanical AVR/MVR:  Coumadin Rx.  He has follow up this week with the coumadin clinic in [place]. 5. CAD:  Continue ASA, statin. 6. Disposition:  Probable d/c home today after seen by MD.  He will follow up this week in [place].    Richardson Dopp, PA-C   07/30/2013 7:39 AM  Pager  # [phone]   Personally seen and examined. Agree with above. Agree with Lasix 60 mg twice a day. OK for discharge from cardiology perspective.  Candee Furbish, MD

## 2013-07-30 NOTE — Progress Notes (Signed)
Given discharge instructions and prescriptions to patient, verbalized understanding questions answered. IV and tele discontinued. Awaiting sister in law to take pt home. Will cont to monitor pt.

## 2013-07-31 ENCOUNTER — Telehealth: Payer: Self-pay | Admitting: Cardiovascular Disease

## 2013-07-31 ENCOUNTER — Other Ambulatory Visit: Payer: Self-pay | Admitting: Cardiology

## 2013-07-31 LAB — CULTURE, BLOOD (ROUTINE X 2): Culture: NO GROWTH

## 2013-07-31 MED ORDER — WARFARIN SODIUM 5 MG PO TABS: ORAL_TABLET | ORAL | Status: DC

## 2013-07-31 NOTE — Telephone Encounter (Signed)
Received fax refill request  Rx # [id number] Medication:  Warfarin 5 mg tab Qty 45  Sig:  Take one tablet by mouth daily except one and one half tabs on Mondays and Thursdays Physician:  Bronson Ing

## 2013-08-01 LAB — CULTURE, BLOOD (ROUTINE X 2)
CULTURE: NO GROWTH
Culture: NO GROWTH

## 2013-08-03 ENCOUNTER — Ambulatory Visit (INDEPENDENT_AMBULATORY_CARE_PROVIDER_SITE_OTHER): Payer: Medicare Other | Admitting: *Deleted

## 2013-08-03 ENCOUNTER — Ambulatory Visit (INDEPENDENT_AMBULATORY_CARE_PROVIDER_SITE_OTHER): Payer: Medicare Other | Admitting: Adult Health

## 2013-08-03 ENCOUNTER — Encounter (INDEPENDENT_AMBULATORY_CARE_PROVIDER_SITE_OTHER): Payer: Self-pay

## 2013-08-03 ENCOUNTER — Encounter: Payer: Self-pay | Admitting: Adult Health

## 2013-08-03 VITALS — BP 120/59 | HR 73 | Ht 66.0 in | Wt 152.0 lb

## 2013-08-03 DIAGNOSIS — I10 Essential (primary) hypertension: Secondary | ICD-10-CM

## 2013-08-03 DIAGNOSIS — I5033 Acute on chronic diastolic (congestive) heart failure: Secondary | ICD-10-CM

## 2013-08-03 DIAGNOSIS — Z5181 Encounter for therapeutic drug level monitoring: Secondary | ICD-10-CM

## 2013-08-03 DIAGNOSIS — I2510 Atherosclerotic heart disease of native coronary artery without angina pectoris: Secondary | ICD-10-CM

## 2013-08-03 DIAGNOSIS — Z954 Presence of other heart-valve replacement: Secondary | ICD-10-CM

## 2013-08-03 DIAGNOSIS — I4891 Unspecified atrial fibrillation: Secondary | ICD-10-CM

## 2013-08-03 DIAGNOSIS — Z7901 Long term (current) use of anticoagulants: Secondary | ICD-10-CM

## 2013-08-03 DIAGNOSIS — Z9889 Other specified postprocedural states: Secondary | ICD-10-CM

## 2013-08-03 DIAGNOSIS — I509 Heart failure, unspecified: Secondary | ICD-10-CM

## 2013-08-03 LAB — POCT INR: INR: 3.9

## 2013-08-03 MED ORDER — FUROSEMIDE 20 MG PO TABS: 60.0000 mg | ORAL_TABLET | Freq: Two times a day (BID) | ORAL | Status: DC

## 2013-08-03 NOTE — Patient Instructions (Signed)
Your physician recommends that you schedule a follow-up appointment in:  1 month with Dr Harl Bowie  Your physician recommends that you return for lab work in 1 week. Feb 5th BMET

## 2013-08-03 NOTE — Progress Notes (Signed)
HPI: [name] is a 68 year old patient to be est. with Dr. Harl Bowie, posthospitalization for admission with acute respiratory failure in the setting of brief PEA due to acute on chronic diastolic heart failure, acute pulmonary edema, and non-ST elevation type 2 secondary to demand ischemia and COPD.    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 He is on chronic Coumadin therapy. He also has a history of atrial fibrillation, hypertension, CAD of an unspecified vessel. During hospitalization atrial fibrillation was found to be prominent with RVR. Vision had an echocardiogram completed on 07/27/2013, which also included a DCCV which was successful in converting him to normal sinus rhythm. The patient was continued on amlodipine, clopidogrel, atorvastatin, torsemide, magnesium, and metoprolol. Coumadin was also continued.   He comes today feeling "about the same" with continued lower extremity edema and 8 pound weight gain. The patient is not limited his salt intake. Blood pressure appears stable. He states he is taking his Lasix as directed. Denies chest pain or shortness of breath or heart rate increased.  No Known Allergies  Current Outpatient Prescriptions  Medication Sig Dispense Refill  . acetaminophen (TYLENOL) 325 MG tablet Take 2 tablets (650 mg total) by mouth every 6 (six) hours as needed. For pain  30 tablet  0  . amLODipine (NORVASC) 10 MG tablet Take 10 mg by mouth at bedtime.      Marland Kitchen atorvastatin (LIPITOR) 40 MG tablet Take 40 mg by mouth daily.      Marland Kitchen atorvastatin (LIPITOR) 40 MG tablet TAKE ONE TABLET BY MOUTH EVERY DAY. **THIS REPLACES ZOCOR (SIMVASTATIN)**  30 tablet  3  . clopidogrel (PLAVIX) 75 MG tablet Take 1 tablet (75 mg total) by mouth daily with breakfast.  30 tablet  6  . ferrous sulfate 325 (65 FE) MG EC tablet Take 325 mg by mouth daily with breakfast.      . folic acid (FOLVITE) 1 MG tablet Take 1 tablet (1 mg total) by mouth daily.   30 tablet  4  . furosemide (LASIX) 20 MG tablet Take 3 tablets (60 mg total) by mouth 2 (two) times daily.  180 tablet  2  . HYDROcodone-acetaminophen (NORCO) 10-325 MG per tablet Take 1 tablet by mouth every 6 (six) hours as needed (pain).      Marland Kitchen levalbuterol (XOPENEX) 0.63 MG/3ML nebulizer solution Take 3 mLs (0.63 mg total) by nebulization every 6 (six) hours as needed for wheezing or shortness of breath.  3 mL  4  . magnesium oxide (MAG-OX) 400 (241.3 MG) MG tablet Take 1 tablet (400 mg total) by mouth daily.  30 tablet  6  . metoprolol (LOPRESSOR) 100 MG tablet Take 1 tablet (100 mg total) by mouth 2 (two) times daily.  60 tablet  6  . Multiple Vitamin (MULTIVITAMIN WITH MINERALS) TABS Take 1 tablet by mouth daily.      . nitroGLYCERIN (NITROSTAT) 0.4 MG SL tablet Place 1 tablet (0.4 mg total) under the tongue every 5 (five) minutes x 3 doses as needed for chest pain.  25 tablet  3  . pantoprazole (PROTONIX) 40 MG tablet Take 1 tablet (40 mg total) by mouth daily.  30 tablet  6  . potassium chloride SA (K-DUR,KLOR-CON) 20 MEQ tablet Take 20 mEq by mouth daily.      Marland Kitchen thiamine 100 MG tablet Take 1 tablet (100 mg total) by mouth daily.  30 tablet  4  . warfarin (COUMADIN)  5 MG tablet Take 1 1/2 tablets (7.5 mg) on Monday, Wednesday, Friday, take 1 tablet (5 mg) on Sunday, Tuesday, Thursday and Saturday  45 tablet  6   No current facility-administered medications for this visit.    Past Medical History  Diagnosis Date  . Hypertension   . CAD (coronary artery disease) 2003    a. CABG/MVR/AVR-2003 (#25 St. Jude/#21 St. Jude); anticoagulation; b. negative stress nuclear-2005;  c. 01/2013 NSTEMI/Cath/PCI: LM nl, LAD 50p, 40-50m, D1 50ost, LCX 30m, RCA 50/99m (2.5x20 Promus DES), 50d, VG->Diag 100, VG->PDA 100, VG->OM 100, LIMA->LAD ok.  . Epistaxis     requiring cautery & aterial ligation-09/2009  . GERD (gastroesophageal reflux disease)   . Hyperlipemia   . Abnormal LFTs     possible  cirrhosis  . Chronic anticoagulation   . Valvular heart disease     a. 2003: MVR/AVR-2003 (#25 St. Jude/#21 St. Jude);  b. 01/2013 Echo: EF 55%, Mech AVR mean grad 12, Mech MVR mean grad 6.  . Tobacco abuse     45  pack years  . Fasting hyperglycemia   . Nephrolithiasis   . Diverticulosis   . Hyponatremia   . Chronic diastolic CHF (congestive heart failure)   . Chronic renal disease   . Hemolytic anemia     history of  . ETOH abuse   . A-fib 01/16/2013    a. on coumadin  . COPD (chronic obstructive pulmonary disease)     CONTROLED  . Hx of echocardiogram     a. Echo (07/2013):  EF 50-55%, normal MVR/AVR, mod LAE.    Past Surgical History  Procedure Laterality Date  . Endocopic sphenopalatine artery ligation & cautry    . Inguinal hernia repair      Left & right  . Coronary artery bypass graft  11/2001    [facility]  . Cardiac valve replacement  11/2001    AVR and MVR-St. Jude devices  . Cataract extraction w/phaco  01/20/2011    Procedure: CATARACT EXTRACTION PHACO AND INTRAOCULAR LENS PLACEMENT (IOC);  Surgeon: Elta Guadeloupe T. Gershon Crane;  Location: [facility];  Service: Ophthalmology;  Laterality: Right;  CDE: 8.51  . Cataract extraction w/phaco  02/03/2011    Procedure: CATARACT EXTRACTION PHACO AND INTRAOCULAR LENS PLACEMENT (IOC);  Surgeon: Elta Guadeloupe T. Gershon Crane;  Location: [facility];  Service: Ophthalmology;  Laterality: Left;  CDE:10.01  . Colonoscopy  04/05/02; 08/2011    friable anal canal hemorrhoids otherwise normal; 2 diminutive polyps excised, minimal diverticulosis noted  . Esophagogastroduodenoscopy  01/2002    Dr. Laural Golden, submucosal esophageal lesion c/w leiomyoma  . Colonoscopy  09/02/2011    Procedure: COLONOSCOPY;  Surgeon: Daneil Dolin, MD;  Location: [facility];  Service: Endoscopy;  Laterality: N/A;  8:15  . Yag laser application Right 17/49/4496    Procedure: YAG LASER APPLICATION;  Surgeon: Elta Guadeloupe T. Gershon Crane, MD;  Location: [facility];  Service: Ophthalmology;  Laterality:  Right;    PRF:FMBWGY of systems complete and found to be negative unless listed above  PHYSICAL EXAM BP 120/59  Pulse 73  Ht 5\' 6"  (1.676 m)  Wt 152 lb (68.947 kg)  BMI 24.55 kg/m2  General: Well developed, well nourished, in no acute distress Head: Eyes PERRLA, No xanthomas.   Normal cephalic and atramatic  Lungs: Clear bilaterally to auscultation and percussion. Heart: HRRR S1 S2, with crisp prosthetic valve sound.  Pulses are 2+ & equal.            No carotid bruit.  No JVD.  No abdominal bruits. No femoral bruits. Abdomen: Bowel sounds are positive, abdomen soft and non-tender without masses or                  Hernia's noted. Msk:  Back normal, normal gait. Normal strength and tone for age. Extremities: No clubbing, cyanosis ,2+ edema.  DP +1 Neuro: Alert and oriented X 3. Psych:  Good affect, responds appropriately    ASSESSMENT AND PLAN

## 2013-08-03 NOTE — Assessment & Plan Note (Signed)
He remains on Plavix and aspirin, has had no complaints of angina. He will need close followup concerning CAD symptoms and chronic heart failure. He will be seen by Dr. Harl Bowie in one month.

## 2013-08-03 NOTE — Assessment & Plan Note (Signed)
He has brought his weights from home with him as he is weighing himself daily. He has not been limiting his salt intake as much as I would like. He does have some mild lower extremity edema. I have refilled his Lasix as he was only given a five-day supply on discharge. He will need to take 60 mg in the morning and 60 mg in the evening. I have again reinforced the need for a low-sodium diet. He appears asymptomatic otherwise. He has some chronic dyspnea on exertion.   He will followup with Dr. Harl Bowie in one month. I have advised him that if he gains weight, has worsening dyspnea, or his edema worsens despite use of Lasix he is to call us for further instructions.

## 2013-08-03 NOTE — Progress Notes (Deleted)
Name: [name]    DOB: October 25, 1945  Age: 68 y.o.  MR#: [id number]       PCP:  Alonza Bogus, MD      Insurance: Payor: [employer] MEDICARE / Plan: AARP MEDICARE COMPLETE / Product Type: *No Product type* /   CC:    Chief Complaint  Patient presents with  . Cardiac Valve Problem    VS Filed Vitals:   08/03/13 1505  BP: 120/59  Pulse: 73  Height: 5\' 6"  (1.676 m)  Weight: 152 lb (68.947 kg)    Weights Current Weight  08/03/13 152 lb (68.947 kg)  07/30/13 142 lb 12.8 oz (64.774 kg)  07/30/13 142 lb 12.8 oz (64.774 kg)    Blood Pressure  BP Readings from Last 3 Encounters:  08/03/13 120/59  07/30/13 125/60  07/30/13 125/60     Admit date:  (Not on file) Last encounter with RMR:  07/03/2013   Allergy Review of patient's allergies indicates no known allergies.  Current Outpatient Prescriptions  Medication Sig Dispense Refill  . acetaminophen (TYLENOL) 325 MG tablet Take 2 tablets (650 mg total) by mouth every 6 (six) hours as needed. For pain  30 tablet  0  . amLODipine (NORVASC) 10 MG tablet Take 10 mg by mouth at bedtime.      Marland Kitchen atorvastatin (LIPITOR) 40 MG tablet Take 40 mg by mouth daily.      Marland Kitchen atorvastatin (LIPITOR) 40 MG tablet TAKE ONE TABLET BY MOUTH EVERY DAY. **THIS REPLACES ZOCOR (SIMVASTATIN)**  30 tablet  3  . clopidogrel (PLAVIX) 75 MG tablet Take 1 tablet (75 mg total) by mouth daily with breakfast.  30 tablet  6  . ferrous sulfate 325 (65 FE) MG EC tablet Take 325 mg by mouth daily with breakfast.      . folic acid (FOLVITE) 1 MG tablet Take 1 tablet (1 mg total) by mouth daily.  30 tablet  4  . furosemide (LASIX) 20 MG tablet Take 3 tablets (60 mg total) by mouth 2 (two) times daily.  30 tablet  2  . HYDROcodone-acetaminophen (NORCO) 10-325 MG per tablet Take 1 tablet by mouth every 6 (six) hours as needed (pain).      Marland Kitchen levalbuterol (XOPENEX) 0.63 MG/3ML nebulizer solution Take 3 mLs (0.63 mg total) by nebulization every 6 (six) hours as  needed for wheezing or shortness of breath.  3 mL  4  . magnesium oxide (MAG-OX) 400 (241.3 MG) MG tablet Take 1 tablet (400 mg total) by mouth daily.  30 tablet  6  . metoprolol (LOPRESSOR) 100 MG tablet Take 1 tablet (100 mg total) by mouth 2 (two) times daily.  60 tablet  6  . Multiple Vitamin (MULTIVITAMIN WITH MINERALS) TABS Take 1 tablet by mouth daily.      . nitroGLYCERIN (NITROSTAT) 0.4 MG SL tablet Place 1 tablet (0.4 mg total) under the tongue every 5 (five) minutes x 3 doses as needed for chest pain.  25 tablet  3  . pantoprazole (PROTONIX) 40 MG tablet Take 1 tablet (40 mg total) by mouth daily.  30 tablet  6  . potassium chloride SA (K-DUR,KLOR-CON) 20 MEQ tablet Take 20 mEq by mouth daily.      Marland Kitchen thiamine 100 MG tablet Take 1 tablet (100 mg total) by mouth daily.  30 tablet  4  . warfarin (COUMADIN) 5 MG tablet Take 1 1/2 tablets (7.5 mg) on Monday, Wednesday, Friday, take 1 tablet (5 mg) on Sunday, Tuesday, Thursday  and Saturday  45 tablet  6   No current facility-administered medications for this visit.    Discontinued Meds:   There are no discontinued medications.  Patient Active Problem List   Diagnosis Date Noted  . Encounter for therapeutic drug monitoring 08/03/2013  . Atrial fibrillation 07/30/2013  . CHF (congestive heart failure) 07/23/2013  . Acute on chronic diastolic CHF (congestive heart failure), NYHA class 4 01/30/2013  . Acute respiratory failure with hypoxia 01/17/2013  . NSTEMI (non-ST elevated myocardial infarction) 01/16/2013  . Coronary atherosclerosis of unspecified type of vessel, native or graft   . Hypertension   . GERD (gastroesophageal reflux disease)   . Nephrolithiasis   . Chronic anticoagulation 12/22/2010  . HYPONATREMIA, CHRONIC 04/06/2010  . HYPERLIPIDEMIA 02/20/2010  . ANEMIA 02/20/2010  . TOBACCO ABUSE 02/20/2010  . CHRONIC OBSTRUCTIVE PULMONARY DISEASE 02/20/2010  . DIVERTICULOSIS, COLON 02/20/2010  . MITRAL VALVE REPLACEMENT, HX  OF 02/20/2010  . AORTIC VALVE REPLACEMENT, HX OF 02/20/2010    LABS    Component Value Date/Time   NA 140 07/28/2013 0505   NA 138 07/27/2013 1210   NA 137 07/26/2013 1530   K 4.4 07/28/2013 0505   K 3.9 07/27/2013 1210   K 4.1 07/26/2013 1530   CL 98 07/28/2013 0505   CL 98 07/27/2013 1210   CL 97 07/26/2013 1530   CO2 29 07/28/2013 0505   CO2 27 07/27/2013 1210   CO2 29 07/26/2013 1530   GLUCOSE 94 07/28/2013 0505   GLUCOSE 91 07/27/2013 1210   GLUCOSE 132* 07/26/2013 1530   BUN 25* 07/28/2013 0505   BUN 27* 07/27/2013 1210   BUN 32* 07/26/2013 1530   CREATININE 0.91 07/28/2013 0505   CREATININE 0.95 07/27/2013 1210   CREATININE 1.14 07/26/2013 1530   CREATININE 1.39* 07/11/2013 1048   CREATININE 0.84 05/18/2011 0710   CALCIUM 9.3 07/28/2013 0505   CALCIUM 9.0 07/27/2013 1210   CALCIUM 8.7 07/26/2013 1530   GFRNONAA 85* 07/28/2013 0505   GFRNONAA 84* 07/27/2013 1210   GFRNONAA 64* 07/26/2013 1530   GFRAA >90 07/28/2013 0505   GFRAA >90 07/27/2013 1210   GFRAA 74* 07/26/2013 1530   CMP     Component Value Date/Time   NA 140 07/28/2013 0505   K 4.4 07/28/2013 0505   CL 98 07/28/2013 0505   CO2 29 07/28/2013 0505   GLUCOSE 94 07/28/2013 0505   BUN 25* 07/28/2013 0505   CREATININE 0.91 07/28/2013 0505   CREATININE 1.39* 07/11/2013 1048   CALCIUM 9.3 07/28/2013 0505   PROT 6.0 07/24/2013 0300   ALBUMIN 2.9* 07/24/2013 0300   AST 52* 07/24/2013 0300   ALT 26 07/24/2013 0300   ALKPHOS 103 07/24/2013 0300   BILITOT 0.5 07/24/2013 0300   GFRNONAA 85* 07/28/2013 0505   GFRAA >90 07/28/2013 0505       Component Value Date/Time   WBC 8.4 07/28/2013 0505   WBC 7.5 07/26/2013 0519   WBC 9.2 07/25/2013 0415   HGB 10.2* 07/28/2013 0505   HGB 9.8* 07/26/2013 0519   HGB 11.0* 07/25/2013 0415   HCT 31.8* 07/28/2013 0505   HCT 29.7* 07/26/2013 0519   HCT 32.7* 07/25/2013 0415   MCV 101.6* 07/28/2013 0505   MCV 101.4* 07/26/2013 0519   MCV 100.3* 07/25/2013 0415    Lipid Panel     Component Value Date/Time   CHOL 147  01/28/2013 0119   TRIG 47 01/28/2013 0119   HDL 64 01/28/2013 0119   CHOLHDL 2.3 01/28/2013 0119  VLDL 9 01/28/2013 0119   LDLCALC 74 01/28/2013 0119   LDLCALC 62 05/17/2008    ABG    Component Value Date/Time   PHART 7.272* 07/23/2013 1450   PCO2ART 57.2* 07/23/2013 1450   PO2ART 70.0* 07/23/2013 1450   HCO3 26.9* 07/23/2013 1450   TCO2 29 07/23/2013 1450   ACIDBASEDEF 2.0 07/23/2013 1450   O2SAT 93.0 07/23/2013 1450     No results found for this basename: TSH   BNP (last 3 results)  Recent Labs  01/27/13 0705 05/29/13 0826 07/23/13 1008  PROBNP 5719.0* 6106.0* 8081.0*   Cardiac Panel (last 3 results) No results found for this basename: CKTOTAL, CKMB, TROPONINI, RELINDX,  in the last 72 hours  Iron/TIBC/Ferritin    Component Value Date/Time   IRON 171* 07/24/2009 0552   TIBC 356 07/24/2009 0552   FERRITIN 224 07/24/2009 0552     EKG Orders placed during the hospital encounter of 07/23/13  . EKG 12-LEAD  . EKG 12-LEAD  . EKG 12-LEAD  . EKG 12-LEAD  . EKG 12-LEAD  . EKG 12-LEAD  . EKG 12-LEAD  . EKG 12-LEAD  . EKG  . EKG 12-LEAD  . EKG 12-LEAD  . EKG 12-LEAD  . EKG 12-LEAD  . EKG 12-LEAD  . EKG 12-LEAD     Prior Assessment and Plan Problem List as of 08/03/2013     Cardiovascular and Mediastinum   Coronary atherosclerosis of unspecified type of vessel, native or graft   Last Assessment & Plan   03/30/2013 Office Visit Written 03/30/2013  3:52 PM by Lendon Colonel, NP     He is without cardiac complaint currently. No chest discomfort, dyspnea on exertion, or weakness. He would continue with risk management. In ongoing counseling concerning his tobacco abuse.    Hypertension   Last Assessment & Plan   07/03/2013 Office Visit Written 07/03/2013  3:05 PM by Lendon Colonel, NP     Essentially controlled today. Continue current medications.    NSTEMI (non-ST elevated myocardial infarction)   Last Assessment & Plan   02/10/2013 Office Visit Written 02/10/2013   3:27 PM by Lendon Colonel, NP     He is without cardiac complaint. He is medically compliant. He will continue with risk factor management with close follow up. No changes in his medications regimen.    Acute on chronic diastolic CHF (congestive heart failure), NYHA class 4   Last Assessment & Plan   07/03/2013 Office Visit Written 07/03/2013  3:04 PM by Lendon Colonel, NP     He is well compensated today, but has LEE. I think this is related to the amlodipine but the edema is pre-tibal 2+. I will change his lasix 80 mg BID to Torsemide 40 mg BID. Will repeat BMET in one week. He will be established with Dr. Harl Bowie on follow up.    CHF (congestive heart failure)   Atrial fibrillation     Respiratory   CHRONIC OBSTRUCTIVE PULMONARY DISEASE   Last Assessment & Plan   03/19/2011 Office Visit Written 03/19/2011 12:48 PM by Yehuda Savannah, MD     Unfortunately, patient is not inclined to attempt to discontinue cigarette smoking.    Acute respiratory failure with hypoxia     Digestive   DIVERTICULOSIS, COLON   GERD (gastroesophageal reflux disease)     Genitourinary   Nephrolithiasis     Other   HYPERLIPIDEMIA   Last Assessment & Plan   03/22/2012 Office Visit Written 03/22/2012  1:33  PM by Lendon Colonel, NP     I have advised him smoking cessation would help with cholesterol status. He will have followup labs completed by Dr. Luan Pulling next week. He verbalizes understanding.    HYPONATREMIA, CHRONIC   Last Assessment & Plan   03/19/2011 Office Visit Written 03/19/2011 12:52 PM by Yehuda Savannah, MD     Electrolytes will be reassessed.    ANEMIA   Last Assessment & Plan   08/10/2011 Office Visit Written 08/10/2011 12:32 PM by Mahala Menghini, PA     Chronic anemia. Heme neg X 3 in 05/2011, Dr. Lattie Haw. 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 Colonoscopy in near future.  I have discussed the risks, alternatives, benefits with regards to but not limited to the risk of reaction to  medication, bleeding, infection, perforation and the patient is agreeable to proceed. Written consent to be obtained.     TOBACCO ABUSE   Last Assessment & Plan   03/30/2013 Office Visit Written 03/30/2013  3:52 PM by Lendon Colonel, NP     Unfortunately continues to smoke a minimum of one half pack of cigarettes a day. He is not inclined to quit. I reiterated the need to do so with his cardiovascular issues. He verbalizes understanding.    MITRAL VALVE REPLACEMENT, HX OF   AORTIC VALVE REPLACEMENT, HX OF   Last Assessment & Plan   02/10/2013 Office Visit Written 02/10/2013  3:32 PM by Lendon Colonel, NP     He continues on coumadin therapy. He will follow up in coumadin clinic for ongoing assessment of INR and dosing.    Chronic anticoagulation   Last Assessment & Plan   07/03/2013 Office Visit Written 07/03/2013  3:06 PM by Lendon Colonel, NP     Seeing Edrick Oh RN today in coumadin clinic. She will manage his dosing.    Encounter for therapeutic drug monitoring       Imaging: Dg Chest Port 1 View  07/26/2013   CLINICAL DATA:  Acute respiratory failure  EXAM: PORTABLE CHEST - 1 VIEW  COMPARISON:  July 24, 2013  FINDINGS: Endotracheal tube and nasogastric tube have been removed. The left jugular catheter tip is in the left innominate vein near the junction with the superior vena cava. No pneumothorax. There is increase in interstitial and patchy alveolar edema compared to 2 days prior. There is cardiomegaly with small effusions. There are prosthetic mitral and aortic valves. No adenopathy.  IMPRESSION: Congestive heart failure with increased edema.  No pneumothorax.   Electronically Signed   By: Lowella Grip M.D.   On: 07/26/2013 07:33   Dg Chest Port 1 View  07/24/2013   CLINICAL DATA:  Ventilator  EXAM: PORTABLE CHEST - 1 VIEW  COMPARISON:  07/24/2013  FINDINGS: Endotracheal tube remains in good position. NG tube tip not well seen but may be in the distal esophagus. Left  jugular catheter tip in the SVC in unchanged. No pneumothorax  Diffuse bilateral airspace disease shows mild interval improvement. Possible edema.  IMPRESSION: Mild improvement in diffuse bilateral airspace disease consistent with edema.   Electronically Signed   By: Franchot Gallo M.D.   On: 07/24/2013 14:13   Dg Chest Port 1 View  07/24/2013   CLINICAL DATA:  Acute respiratory failure. On ventilator. Congestive heart failure. Coronary artery disease.  EXAM: PORTABLE CHEST - 1 VIEW  COMPARISON:  07/23/2013  FINDINGS: Support lines and tubes in appropriate position. Diffuse bilateral airspace disease and probable layering bilateral  pleural effusion show no significant change. Cardiomegaly is stable.  IMPRESSION: No significant change compared with prior exam.   Electronically Signed   By: Earle Gell M.D.   On: 07/24/2013 07:17   Dg Chest Port 1 View  07/23/2013   CLINICAL DATA:  Central line placement.  EXAM: PORTABLE CHEST - 1 VIEW  COMPARISON:  Chest x-ray 08/25/2013.  FINDINGS: An endotracheal tube is in place with tip 4.8 cm above the carina. A nasogastric tube is seen extending into the stomach, however, the tip of the nasogastric tube extends below the lower margin of the image. There is a left-sided internal jugular central venous catheter with tip terminating in the proximal superior vena cava. Lung volumes are slightly low. There continues to be widespread interstitial and airspace disease throughout the lungs bilaterally, with some cephalization of the pulmonary vasculature, most compatible with moderate pulmonary edema. Overall, this is slightly improved compared to yesterday's examination. Probable trace bilateral pleural effusions. Heart size remains mildly enlarged. The patient is rotated to the right on today's exam, resulting in distortion of the mediastinal contours and reduced diagnostic sensitivity and specificity for mediastinal pathology. Atherosclerosis in the thoracic aorta. Status post  median sternotomy for CABG and mechanical mitral and aortic valve replacements.  IMPRESSION: 1. Overall, there appears to be slightly improved aeration compared to yesterday's examination, however, there is still evidence for moderate congestive heart failure. 2. Support apparatus and postoperative changes, as above.   Electronically Signed   By: Vinnie Langton M.D.   On: 07/23/2013 14:22   Dg Chest Portable 1 View  07/23/2013   CLINICAL DATA:  Endotracheal tube placement  EXAM: PORTABLE CHEST - 1 VIEW  COMPARISON:  05/29/2013  FINDINGS: Moderate cardiac enlargement. Status post CABG and replacement cardiac valve placement. Endotracheal tube is seen with tip 3.9 cm above the carina. There is vascular congestion in. There is moderate to severe interstitial prominence with more confluent alveolar opacities in the mid lung zones, right worse than left.  IMPRESSION: Endotracheal tube as described. Congestive heart failure with moderate to severe pulmonary edema.   Electronically Signed   By: Skipper Cliche M.D.   On: 07/23/2013 10:10

## 2013-08-03 NOTE — Assessment & Plan Note (Signed)
Heart rate is well-controlled, he continues on Coumadin, and has had dosing completed today in our [place] office. No other change in his medication regimen at this time.

## 2013-08-09 LAB — BASIC METABOLIC PANEL
BUN: 29 mg/dL — ABNORMAL HIGH (ref 6–23)
CO2: 31 meq/L (ref 19–32)
Calcium: 9.4 mg/dL (ref 8.4–10.5)
Chloride: 99 mEq/L (ref 96–112)
Creat: 1.18 mg/dL (ref 0.50–1.35)
Glucose, Bld: 77 mg/dL (ref 70–99)
POTASSIUM: 4.5 meq/L (ref 3.5–5.3)
SODIUM: 140 meq/L (ref 135–145)

## 2013-08-14 ENCOUNTER — Inpatient Hospital Stay (HOSPITAL_COMMUNITY)
Admission: EM | Admit: 2013-08-14 | Discharge: 2013-08-16 | DRG: 291 | Disposition: A | Discharge status: Home / Self Care | Payer: Medicare Other | Attending: Pulmonary Disease | Admitting: Pulmonary Disease

## 2013-08-14 ENCOUNTER — Emergency Department (HOSPITAL_COMMUNITY): Payer: Medicare Other

## 2013-08-14 ENCOUNTER — Encounter (HOSPITAL_COMMUNITY): Payer: Self-pay | Admitting: Emergency Medicine

## 2013-08-14 DIAGNOSIS — E785 Hyperlipidemia, unspecified: Secondary | ICD-10-CM | POA: Diagnosis present

## 2013-08-14 DIAGNOSIS — I252 Old myocardial infarction: Secondary | ICD-10-CM

## 2013-08-14 DIAGNOSIS — Z954 Presence of other heart-valve replacement: Secondary | ICD-10-CM

## 2013-08-14 DIAGNOSIS — J9600 Acute respiratory failure, unspecified whether with hypoxia or hypercapnia: Secondary | ICD-10-CM | POA: Diagnosis present

## 2013-08-14 DIAGNOSIS — I509 Heart failure, unspecified: Secondary | ICD-10-CM | POA: Diagnosis present

## 2013-08-14 DIAGNOSIS — I5033 Acute on chronic diastolic (congestive) heart failure: Principal | ICD-10-CM | POA: Diagnosis present

## 2013-08-14 DIAGNOSIS — Z87442 Personal history of urinary calculi: Secondary | ICD-10-CM

## 2013-08-14 DIAGNOSIS — Z9889 Other specified postprocedural states: Secondary | ICD-10-CM

## 2013-08-14 DIAGNOSIS — D649 Anemia, unspecified: Secondary | ICD-10-CM | POA: Diagnosis present

## 2013-08-14 DIAGNOSIS — E871 Hypo-osmolality and hyponatremia: Secondary | ICD-10-CM | POA: Diagnosis present

## 2013-08-14 DIAGNOSIS — Z79899 Other long term (current) drug therapy: Secondary | ICD-10-CM

## 2013-08-14 DIAGNOSIS — I4891 Unspecified atrial fibrillation: Secondary | ICD-10-CM | POA: Diagnosis present

## 2013-08-14 DIAGNOSIS — I129 Hypertensive chronic kidney disease with stage 1 through stage 4 chronic kidney disease, or unspecified chronic kidney disease: Secondary | ICD-10-CM | POA: Diagnosis present

## 2013-08-14 DIAGNOSIS — N189 Chronic kidney disease, unspecified: Secondary | ICD-10-CM | POA: Diagnosis present

## 2013-08-14 DIAGNOSIS — F1010 Alcohol abuse, uncomplicated: Secondary | ICD-10-CM | POA: Diagnosis present

## 2013-08-14 DIAGNOSIS — K219 Gastro-esophageal reflux disease without esophagitis: Secondary | ICD-10-CM | POA: Diagnosis present

## 2013-08-14 DIAGNOSIS — Z7901 Long term (current) use of anticoagulants: Secondary | ICD-10-CM

## 2013-08-14 DIAGNOSIS — I10 Essential (primary) hypertension: Secondary | ICD-10-CM

## 2013-08-14 DIAGNOSIS — J449 Chronic obstructive pulmonary disease, unspecified: Secondary | ICD-10-CM | POA: Diagnosis present

## 2013-08-14 DIAGNOSIS — Z951 Presence of aortocoronary bypass graft: Secondary | ICD-10-CM

## 2013-08-14 DIAGNOSIS — Z9981 Dependence on supplemental oxygen: Secondary | ICD-10-CM

## 2013-08-14 DIAGNOSIS — F17200 Nicotine dependence, unspecified, uncomplicated: Secondary | ICD-10-CM | POA: Diagnosis present

## 2013-08-14 DIAGNOSIS — I2510 Atherosclerotic heart disease of native coronary artery without angina pectoris: Secondary | ICD-10-CM | POA: Diagnosis present

## 2013-08-14 DIAGNOSIS — J4489 Other specified chronic obstructive pulmonary disease: Secondary | ICD-10-CM | POA: Diagnosis present

## 2013-08-14 DIAGNOSIS — Z952 Presence of prosthetic heart valve: Secondary | ICD-10-CM

## 2013-08-14 DIAGNOSIS — J9601 Acute respiratory failure with hypoxia: Secondary | ICD-10-CM | POA: Diagnosis present

## 2013-08-14 HISTORY — DX: Essential (primary) hypertension: I10

## 2013-08-14 HISTORY — DX: Atherosclerotic heart disease of native coronary artery without angina pectoris: I25.10

## 2013-08-14 LAB — CBC WITH DIFFERENTIAL/PLATELET
BASOS ABS: 0.1 10*3/uL (ref 0.0–0.1)
BASOS PCT: 1 % (ref 0–1)
EOS PCT: 4 % (ref 0–5)
Eosinophils Absolute: 0.5 10*3/uL (ref 0.0–0.7)
HEMATOCRIT: 34.2 % — AB (ref 39.0–52.0)
Hemoglobin: 11.3 g/dL — ABNORMAL LOW (ref 13.0–17.0)
Lymphocytes Relative: 5 % — ABNORMAL LOW (ref 12–46)
Lymphs Abs: 0.6 10*3/uL — ABNORMAL LOW (ref 0.7–4.0)
MCH: 32.8 pg (ref 26.0–34.0)
MCHC: 33 g/dL (ref 30.0–36.0)
MCV: 99.1 fL (ref 78.0–100.0)
MONO ABS: 0.8 10*3/uL (ref 0.1–1.0)
MONOS PCT: 8 % (ref 3–12)
Neutro Abs: 8.7 10*3/uL — ABNORMAL HIGH (ref 1.7–7.7)
Neutrophils Relative %: 82 % — ABNORMAL HIGH (ref 43–77)
Platelets: 389 10*3/uL (ref 150–400)
RBC: 3.45 MIL/uL — ABNORMAL LOW (ref 4.22–5.81)
RDW: 14.8 % (ref 11.5–15.5)
WBC: 10.5 10*3/uL (ref 4.0–10.5)

## 2013-08-14 LAB — URINALYSIS, ROUTINE W REFLEX MICROSCOPIC
Bilirubin Urine: NEGATIVE
Glucose, UA: NEGATIVE mg/dL
Ketones, ur: NEGATIVE mg/dL
LEUKOCYTES UA: NEGATIVE
NITRITE: NEGATIVE
PH: 5.5 (ref 5.0–8.0)
Protein, ur: 30 mg/dL — AB
Specific Gravity, Urine: 1.015 (ref 1.005–1.030)
UROBILINOGEN UA: 0.2 mg/dL (ref 0.0–1.0)

## 2013-08-14 LAB — URINE MICROSCOPIC-ADD ON

## 2013-08-14 LAB — COMPREHENSIVE METABOLIC PANEL
ALBUMIN: 4 g/dL (ref 3.5–5.2)
ALT: 16 U/L (ref 0–53)
AST: 28 U/L (ref 0–37)
Alkaline Phosphatase: 145 U/L — ABNORMAL HIGH (ref 39–117)
BUN: 37 mg/dL — AB (ref 6–23)
CALCIUM: 9.3 mg/dL (ref 8.4–10.5)
CO2: 29 mEq/L (ref 19–32)
CREATININE: 1.12 mg/dL (ref 0.50–1.35)
Chloride: 94 mEq/L — ABNORMAL LOW (ref 96–112)
GFR calc Af Amer: 76 mL/min — ABNORMAL LOW (ref >90)
GFR calc non Af Amer: 66 mL/min — ABNORMAL LOW (ref >90)
Glucose, Bld: 124 mg/dL — ABNORMAL HIGH (ref 70–99)
Potassium: 4.1 mEq/L (ref 3.7–5.3)
Sodium: 134 mEq/L — ABNORMAL LOW (ref 137–147)
Total Bilirubin: 0.6 mg/dL (ref 0.3–1.2)
Total Protein: 8.4 g/dL — ABNORMAL HIGH (ref 6.0–8.3)

## 2013-08-14 LAB — PRO B NATRIURETIC PEPTIDE: Pro B Natriuretic peptide (BNP): 5977 pg/mL — ABNORMAL HIGH (ref 0–125)

## 2013-08-14 LAB — PROTIME-INR
INR: 2.5 — ABNORMAL HIGH (ref 0.00–1.49)
Prothrombin Time: 26.2 seconds — ABNORMAL HIGH (ref 11.6–15.2)

## 2013-08-14 LAB — TROPONIN I: Troponin I: <0.3 ng/mL (ref <0.30)

## 2013-08-14 MED ORDER — WARFARIN SODIUM 7.5 MG PO TABS
7.5000 mg | ORAL_TABLET | ORAL | Status: DC
  Administered 2013-08-14: 7.5 mg via ORAL
  Filled 2013-08-14: qty 1

## 2013-08-14 MED ORDER — FUROSEMIDE 10 MG/ML IJ SOLN
80.0000 mg | Freq: Every day | INTRAMUSCULAR | Status: DC
  Administered 2013-08-15: 80 mg via INTRAVENOUS
  Filled 2013-08-14: qty 8

## 2013-08-14 MED ORDER — IPRATROPIUM BROMIDE 0.02 % IN SOLN
0.5000 mg | Freq: Once | RESPIRATORY_TRACT | Status: AC
  Administered 2013-08-14: 0.5 mg via RESPIRATORY_TRACT
  Filled 2013-08-14: qty 2.5

## 2013-08-14 MED ORDER — CLOPIDOGREL BISULFATE 75 MG PO TABS
75.0000 mg | ORAL_TABLET | Freq: Every day | ORAL | Status: DC
  Administered 2013-08-15 – 2013-08-16 (×2): 75 mg via ORAL
  Filled 2013-08-14 (×2): qty 1

## 2013-08-14 MED ORDER — ONDANSETRON HCL 4 MG/2ML IJ SOLN: 4.0000 mg | Freq: Four times a day (QID) | INTRAMUSCULAR | Status: DC | PRN

## 2013-08-14 MED ORDER — METHYLPREDNISOLONE SODIUM SUCC 125 MG IJ SOLR
125.0000 mg | Freq: Once | INTRAMUSCULAR | Status: AC
  Administered 2013-08-14: 125 mg via INTRAVENOUS
  Filled 2013-08-14: qty 2

## 2013-08-14 MED ORDER — FOLIC ACID 1 MG PO TABS
1.0000 mg | ORAL_TABLET | Freq: Every day | ORAL | Status: DC
  Administered 2013-08-15 – 2013-08-16 (×2): 1 mg via ORAL
  Filled 2013-08-14 (×2): qty 1

## 2013-08-14 MED ORDER — NITROGLYCERIN 2 % TD OINT
1.0000 [in_us] | TOPICAL_OINTMENT | Freq: Three times a day (TID) | TRANSDERMAL | Status: DC
  Administered 2013-08-15 – 2013-08-16 (×5): 1 [in_us] via TOPICAL
  Filled 2013-08-14 (×6): qty 1

## 2013-08-14 MED ORDER — ADULT MULTIVITAMIN W/MINERALS CH
1.0000 | ORAL_TABLET | Freq: Every day | ORAL | Status: DC
Start: 2013-08-15 — End: 2013-08-16
  Administered 2013-08-15 – 2013-08-16 (×2): 1 via ORAL
  Filled 2013-08-14 (×2): qty 1

## 2013-08-14 MED ORDER — WARFARIN - PHYSICIAN DOSING INPATIENT
Freq: Every day | Status: DC
  Administered 2013-08-15: 18:00:00

## 2013-08-14 MED ORDER — SODIUM CHLORIDE 0.9 % IJ SOLN
3.0000 mL | Freq: Two times a day (BID) | INTRAMUSCULAR | Status: DC
  Administered 2013-08-14 – 2013-08-15 (×3): 3 mL via INTRAVENOUS

## 2013-08-14 MED ORDER — SODIUM CHLORIDE 0.9 % IV SOLN: 250.0000 mL | INTRAVENOUS | Status: DC | PRN

## 2013-08-14 MED ORDER — AMLODIPINE BESYLATE 5 MG PO TABS
10.0000 mg | ORAL_TABLET | Freq: Every day | ORAL | Status: DC
  Administered 2013-08-14 – 2013-08-15 (×2): 10 mg via ORAL
  Filled 2013-08-14 (×2): qty 2

## 2013-08-14 MED ORDER — ASPIRIN EC 81 MG PO TBEC
81.0000 mg | DELAYED_RELEASE_TABLET | Freq: Every day | ORAL | Status: DC
  Administered 2013-08-15 – 2013-08-16 (×2): 81 mg via ORAL
  Filled 2013-08-14 (×2): qty 1

## 2013-08-14 MED ORDER — ACETAMINOPHEN 325 MG PO TABS: 650.0000 mg | ORAL_TABLET | ORAL | Status: DC | PRN

## 2013-08-14 MED ORDER — FUROSEMIDE 10 MG/ML IJ SOLN
80.0000 mg | Freq: Once | INTRAMUSCULAR | Status: AC
  Administered 2013-08-14: 80 mg via INTRAVENOUS
  Filled 2013-08-14: qty 8

## 2013-08-14 MED ORDER — PANTOPRAZOLE SODIUM 40 MG PO TBEC
40.0000 mg | DELAYED_RELEASE_TABLET | Freq: Every day | ORAL | Status: DC
  Administered 2013-08-15 – 2013-08-16 (×2): 40 mg via ORAL
  Filled 2013-08-14 (×2): qty 1

## 2013-08-14 MED ORDER — WARFARIN SODIUM 5 MG PO TABS
5.0000 mg | ORAL_TABLET | ORAL | Status: DC
  Administered 2013-08-15: 5 mg via ORAL
  Filled 2013-08-14: qty 1

## 2013-08-14 MED ORDER — NITROGLYCERIN 2 % TD OINT
1.0000 [in_us] | TOPICAL_OINTMENT | Freq: Once | TRANSDERMAL | Status: AC
  Administered 2013-08-14: 1 [in_us] via TOPICAL
  Filled 2013-08-14: qty 1

## 2013-08-14 MED ORDER — HYDROCODONE-ACETAMINOPHEN 10-325 MG PO TABS: 1.0000 | ORAL_TABLET | Freq: Four times a day (QID) | ORAL | Status: DC | PRN

## 2013-08-14 MED ORDER — SODIUM CHLORIDE 0.9 % IJ SOLN: 3.0000 mL | INTRAMUSCULAR | Status: DC | PRN

## 2013-08-14 MED ORDER — VITAMIN B-1 100 MG PO TABS
100.0000 mg | ORAL_TABLET | Freq: Every day | ORAL | Status: DC
  Administered 2013-08-15 – 2013-08-16 (×2): 100 mg via ORAL
  Filled 2013-08-14 (×2): qty 1

## 2013-08-14 MED ORDER — POTASSIUM CHLORIDE CRYS ER 20 MEQ PO TBCR
20.0000 meq | EXTENDED_RELEASE_TABLET | Freq: Every day | ORAL | Status: DC
  Administered 2013-08-15 – 2013-08-16 (×2): 20 meq via ORAL
  Filled 2013-08-14 (×3): qty 1

## 2013-08-14 MED ORDER — MAGNESIUM OXIDE 400 (241.3 MG) MG PO TABS
400.0000 mg | ORAL_TABLET | Freq: Every day | ORAL | Status: DC
  Administered 2013-08-15 – 2013-08-16 (×2): 400 mg via ORAL
  Filled 2013-08-14 (×2): qty 1

## 2013-08-14 MED ORDER — ALBUTEROL SULFATE (2.5 MG/3ML) 0.083% IN NEBU
5.0000 mg | INHALATION_SOLUTION | Freq: Once | RESPIRATORY_TRACT | Status: AC
  Administered 2013-08-14: 5 mg via RESPIRATORY_TRACT
  Filled 2013-08-14: qty 6

## 2013-08-14 MED ORDER — NITROGLYCERIN 0.4 MG SL SUBL: 0.4000 mg | SUBLINGUAL_TABLET | SUBLINGUAL | Status: DC | PRN

## 2013-08-14 MED ORDER — METOPROLOL TARTRATE 50 MG PO TABS
100.0000 mg | ORAL_TABLET | Freq: Two times a day (BID) | ORAL | Status: DC
  Administered 2013-08-14 – 2013-08-16 (×4): 100 mg via ORAL
  Filled 2013-08-14 (×5): qty 2

## 2013-08-14 NOTE — ED Notes (Signed)
Pt became sob while at [facility]D's office. Initial O2 sat was 79% at [facility] office per EMS. Pt arrived with NRB on 15L at 95%.

## 2013-08-14 NOTE — ED Provider Notes (Signed)
CSN: [id number]     Arrival date & time 08/14/13  1625 History  This chart was scribed for Maudry Diego, MD by Elby Beck, ED Scribe. This patient was seen in room APA06/APA06 and the patient's care was started at 4:30 PM.   Chief Complaint  Patient presents with  . Respiratory Distress    Patient is a 68 y.o. male presenting with shortness of breath. The history is provided by the patient and the EMS personnel. No language interpreter was used.  Shortness of Breath Severity:  Severe Onset quality:  Gradual Duration:  1 day Timing:  Constant Progression:  Improving (improved after pt was placed on a NRB on 15L by EMS) Chronicity:  Recurrent (history of COPD) Relieved by:  Oxygen Worsened by:  Nothing tried Ineffective treatments:  None tried Associated symptoms: no abdominal pain, no chest pain, no cough, no headaches and no rash   Risk factors: tobacco use     HPI Comments: [name] is a 67 y.o. male with a history of COPD, CHF, CAD and HTN who presents to the Emergency Department complaining of acutely worsened SOB onset earlier today, which exceeds his baseline SOB due to COPD. He states that he was at his PCP's office (Dr. Sinda Du) for a routine check-up earlier today when his SOB worsened. He was brought by EMS from Dr. Luan Pulling' office to the ED. EMS reports that pt's O2 stats were 79% when pt initially became SOB. EMS reports that pt was placed on a non-re breather at 15L/min and his O2 stats have improved to 98% currently. Pt states that he is on supplemental oxygen at home. He denies chest pain or any other pain or symptoms.   PCP- Dr. Sinda Du   Past Medical History  Diagnosis Date  . Hypertension   . CAD (coronary artery disease) 2003    a. CABG/MVR/AVR-2003 (#25 St. Jude/#21 St. Jude); anticoagulation; b. negative stress nuclear-2005;  c. 01/2013 NSTEMI/Cath/PCI: LM nl, LAD 50p, 40-40m, D1 50ost, LCX 54m, RCA 50/12m (2.5x20 Promus DES), 50d, VG->Diag  100, VG->PDA 100, VG->OM 100, LIMA->LAD ok.  . Epistaxis     requiring cautery & aterial ligation-09/2009  . GERD (gastroesophageal reflux disease)   . Hyperlipemia   . Abnormal LFTs     possible cirrhosis  . Chronic anticoagulation   . Valvular heart disease     a. 2003: MVR/AVR-2003 (#25 St. Jude/#21 St. Jude);  b. 01/2013 Echo: EF 55%, Mech AVR mean grad 12, Mech MVR mean grad 6.  . Tobacco abuse     45 pack years  . Fasting hyperglycemia   . Nephrolithiasis   . Diverticulosis   . Hyponatremia   . Chronic diastolic CHF (congestive heart failure)   . Chronic renal disease   . Hemolytic anemia     history of  . ETOH abuse   . A-fib 01/16/2013    a. on coumadin  . COPD (chronic obstructive pulmonary disease)     CONTROLED  . Hx of echocardiogram     a. Echo (07/2013):  EF 50-55%, normal MVR/AVR, mod LAE.   Past Surgical History  Procedure Laterality Date  . Endocopic sphenopalatine artery ligation & cautry    . Inguinal hernia repair      Left & right  . Coronary artery bypass graft  11/2001    [facility]  . Cardiac valve replacement  11/2001    AVR and MVR-St. Jude devices  . Cataract extraction w/phaco  01/20/2011  Procedure: CATARACT EXTRACTION PHACO AND INTRAOCULAR LENS PLACEMENT (IOC);  Surgeon: Elta Guadeloupe T. Gershon Crane;  Location: [facility];  Service: Ophthalmology;  Laterality: Right;  CDE: 8.51  . Cataract extraction w/phaco  02/03/2011    Procedure: CATARACT EXTRACTION PHACO AND INTRAOCULAR LENS PLACEMENT (IOC);  Surgeon: Elta Guadeloupe T. Gershon Crane;  Location: [facility];  Service: Ophthalmology;  Laterality: Left;  CDE:10.01  . Colonoscopy  04/05/02; 08/2011    friable anal canal hemorrhoids otherwise normal; 2 diminutive polyps excised, minimal diverticulosis noted  . Esophagogastroduodenoscopy  01/2002    Dr. Laural Golden, submucosal esophageal lesion c/w leiomyoma  . Colonoscopy  09/02/2011    Procedure: COLONOSCOPY;  Surgeon: Daneil Dolin, MD;  Location: [facility];  Service:  Endoscopy;  Laterality: N/A;  8:15  . Yag laser application Right [id number]    Procedure: YAG LASER APPLICATION;  Surgeon: Elta Guadeloupe T. Gershon Crane, MD;  Location: [facility];  Service: Ophthalmology;  Laterality: Right;   Family History  Problem Relation Age of Onset  . Hypotension Neg Hx   . Anesthesia problems Neg Hx   . Malignant hyperthermia Neg Hx   . Pseudochol deficiency Neg Hx   . Colon cancer Neg Hx   . Liver disease Neg Hx    History  Substance Use Topics  . Smoking status: Current Every Day Smoker -- 0.25 packs/day for 52 years    Types: Cigarettes  . Smokeless tobacco: Never Used     Comment: STATES HE IS CUTTING DOWN now only 1/2 ppd (01/27/2013)  . Alcohol Use: 4.2 oz/week    6 Cans of beer, 1 Shots of liquor per week     Comment: DAILY    Review of Systems  Constitutional: Negative for appetite change and fatigue.  HENT: Negative for congestion, ear discharge and sinus pressure.   Eyes: Negative for discharge.  Respiratory: Positive for shortness of breath. Negative for cough.   Cardiovascular: Negative for chest pain.  Gastrointestinal: Negative for abdominal pain and diarrhea.  Genitourinary: Negative for frequency and hematuria.  Musculoskeletal: Negative for back pain.  Skin: Negative for rash.  Neurological: Negative for seizures and headaches.  Psychiatric/Behavioral: Negative for hallucinations.  All other systems reviewed and are negative.   Allergies  Review of patient's allergies indicates no known allergies.  Home Medications   Current Outpatient Rx  Name  Route  Sig  Dispense  Refill  . acetaminophen (TYLENOL) 325 MG tablet   Oral   Take 2 tablets (650 mg total) by mouth every 6 (six) hours as needed. For pain   30 tablet   0   . amLODipine (NORVASC) 10 MG tablet   Oral   Take 10 mg by mouth at bedtime.         Marland Kitchen atorvastatin (LIPITOR) 40 MG tablet   Oral   Take 40 mg by mouth daily.         Marland Kitchen atorvastatin (LIPITOR) 40 MG tablet       TAKE ONE TABLET BY MOUTH EVERY DAY. **THIS REPLACES ZOCOR (SIMVASTATIN)**   30 tablet   3   . clopidogrel (PLAVIX) 75 MG tablet   Oral   Take 1 tablet (75 mg total) by mouth daily with breakfast.   30 tablet   6   . ferrous sulfate 325 (65 FE) MG EC tablet   Oral   Take 325 mg by mouth daily with breakfast.         . folic acid (FOLVITE) 1 MG tablet   Oral   Take  1 tablet (1 mg total) by mouth daily.   30 tablet   4   . furosemide (LASIX) 20 MG tablet   Oral   Take 3 tablets (60 mg total) by mouth 2 (two) times daily.   180 tablet   2   . HYDROcodone-acetaminophen (NORCO) 10-325 MG per tablet   Oral   Take 1 tablet by mouth every 6 (six) hours as needed (pain).         Marland Kitchen levalbuterol (XOPENEX) 0.63 MG/3ML nebulizer solution   Nebulization   Take 3 mLs (0.63 mg total) by nebulization every 6 (six) hours as needed for wheezing or shortness of breath.   3 mL   4   . magnesium oxide (MAG-OX) 400 (241.3 MG) MG tablet   Oral   Take 1 tablet (400 mg total) by mouth daily.   30 tablet   6   . metoprolol (LOPRESSOR) 100 MG tablet   Oral   Take 1 tablet (100 mg total) by mouth 2 (two) times daily.   60 tablet   6   . Multiple Vitamin (MULTIVITAMIN WITH MINERALS) TABS   Oral   Take 1 tablet by mouth daily.         . nitroGLYCERIN (NITROSTAT) 0.4 MG SL tablet   Sublingual   Place 1 tablet (0.4 mg total) under the tongue every 5 (five) minutes x 3 doses as needed for chest pain.   25 tablet   3   . pantoprazole (PROTONIX) 40 MG tablet   Oral   Take 1 tablet (40 mg total) by mouth daily.   30 tablet   6   . potassium chloride SA (K-DUR,KLOR-CON) 20 MEQ tablet   Oral   Take 20 mEq by mouth daily.         Marland Kitchen thiamine 100 MG tablet   Oral   Take 1 tablet (100 mg total) by mouth daily.   30 tablet   4   . warfarin (COUMADIN) 5 MG tablet      Take 1 1/2 tablets (7.5 mg) on Monday, Wednesday, Friday, take 1 tablet (5 mg) on Sunday, Tuesday, Thursday and  Saturday   45 tablet   6    BP 159/66  Pulse 84  Resp 20  SpO2 95%  Physical Exam  Nursing note and vitals reviewed. Constitutional: He is oriented to person, place, and time. He appears well-developed.  HENT:  Head: Normocephalic.  Eyes: Conjunctivae and EOM are normal. No scleral icterus.  Neck: Neck supple. No thyromegaly present.  Cardiovascular: Normal rate and regular rhythm.  Exam reveals no gallop and no friction rub.   No murmur heard. Pulmonary/Chest: No stridor. He has wheezes. He has no rales. He exhibits no tenderness.  Moderate to severe wheezing bilaterally.  Abdominal: He exhibits no distension. There is no tenderness. There is no rebound.  Musculoskeletal: Normal range of motion. He exhibits no edema.  Lymphadenopathy:    He has no cervical adenopathy.  Neurological: He is oriented to person, place, and time. He exhibits normal muscle tone. Coordination normal.  Skin: No rash noted. No erythema.  Psychiatric: He has a normal mood and affect. His behavior is normal.    ED Course  Procedures (including critical care time)  DIAGNOSTIC STUDIES: Oxygen Saturation is 98% on RA, normal by my interpretation.    COORDINATION OF CARE: 4:35 PM- Disucssed plan to order a breathing treatment along with Solu-Medrol. Will also order a CXR and diagnostic lab work. Pt advised  of plan for treatment and pt agrees.  7:34 PM- Recheck with pt and opt states that he is feeling better after receiving medication in the ED. Discussed plan for admission.   Medications  methylPREDNISolone sodium succinate (SOLU-MEDROL) 125 mg/2 mL injection 125 mg (125 mg Intravenous Given 08/14/13 1643)  albuterol (PROVENTIL) (2.5 MG/3ML) 0.083% nebulizer solution 5 mg (5 mg Nebulization Given 08/14/13 1636)  ipratropium (ATROVENT) nebulizer solution 0.5 mg (0.5 mg Nebulization Given 08/14/13 1636)  furosemide (LASIX) injection 80 mg (80 mg Intravenous Given 08/14/13 1708)  nitroGLYCERIN (NITROGLYN) 2 %  ointment 1 inch (1 inch Topical Given 08/14/13 1708)   Labs Review Labs Reviewed  CBC WITH DIFFERENTIAL - Abnormal; Notable for the following:    RBC 3.45 (*)    Hemoglobin 11.3 (*)    HCT 34.2 (*)    Neutrophils Relative % 82 (*)    Neutro Abs 8.7 (*)    Lymphocytes Relative 5 (*)    Lymphs Abs 0.6 (*)    All other components within normal limits  COMPREHENSIVE METABOLIC PANEL - Abnormal; Notable for the following:    Sodium 134 (*)    Chloride 94 (*)    Glucose, Bld 124 (*)    BUN 37 (*)    Total Protein 8.4 (*)    Alkaline Phosphatase 145 (*)    GFR calc non Af Amer 66 (*)    GFR calc Af Amer 76 (*)    All other components within normal limits  PRO B NATRIURETIC PEPTIDE - Abnormal; Notable for the following:    Pro B Natriuretic peptide (BNP) 5977.0 (*)    All other components within normal limits  URINALYSIS, ROUTINE W REFLEX MICROSCOPIC - Abnormal; Notable for the following:    Hgb urine dipstick TRACE (*)    Protein, ur 30 (*)    All other components within normal limits  URINE MICROSCOPIC-ADD ON - Abnormal; Notable for the following:    Squamous Epithelial / LPF FEW (*)    Bacteria, UA FEW (*)    All other components within normal limits  TROPONIN I   Imaging Review Dg Chest 1 View  08/14/2013   CLINICAL DATA:  Respiratory distress, history coronary artery disease, smoking, hypertension, CHF  EXAM: CHEST - 1 VIEW  COMPARISON:  Portable exam 6644 hr compared to 07/26/2013  FINDINGS: Enlargement of cardiac silhouette post median sternotomy.  Pulmonary vascular congestion.  Diffuse interstitial infiltrates bilaterally consistent with pulmonary edema and CHF.  Atherosclerotic calcification aorta.  Unable to exclude small bibasilar effusions.  No pneumothorax.  Bones demineralized.  IMPRESSION: CHF.   Electronically Signed   By: Lavonia Dana M.D.   On: 08/14/2013 16:56    EKG Interpretation    Date/Time:  Monday August 14 2013 16:46:21 EST Ventricular Rate:  84 PR  Interval:  192 QRS Duration: 100 QT Interval:  410 QTC Calculation: 484 R Axis:   -64 Text Interpretation:  Normal sinus rhythm Pulmonary disease pattern Incomplete right bundle branch block Left anterior fascicular block Nonspecific ST abnormality Prolonged QT Abnormal ECG When compared with ECG of 27-Jul-2013 15:03, Significant changes have occurred Confirmed by Vincente Asbridge  MD, Kashmere Staffa ([id number]) on 08/14/2013 7:58:54 PM          CRITICAL CARE Performed by: Gisell Buehrle L Total critical care time: 40 Critical care time was exclusive of separately billable procedures and treating other patients. Critical care was necessary to treat or prevent imminent or life-threatening deterioration. Critical care was time spent personally by me  on the following activities: development of treatment plan with patient and/or surrogate as well as nursing, discussions with consultants, evaluation of patient's response to treatment, examination of patient, obtaining history from patient or surrogate, ordering and performing treatments and interventions, ordering and review of laboratory studies, ordering and review of radiographic studies, pulse oximetry and re-evaluation of patient's condition.   MDM   Final diagnoses:  None     Maudry Diego, MD 08/14/13 2000

## 2013-08-14 NOTE — ED Notes (Signed)
Pt currently on Bi-pap and tolerating well.  When pt sleeping O2 saturations drop to low 90's.  Pt arouses easily and interacts appropriately.  Reports that he "feels better now than earlier".  O2 saturations increase to approximately 95% when awake.

## 2013-08-14 NOTE — Progress Notes (Signed)
Patient requested that his wallet and check book be put in the safe. Both sent down with security guard.

## 2013-08-14 NOTE — Progress Notes (Signed)
Per order pt taken off BIPAP and place on venturi mask at 50% hr 65 spo2 95% rr 19

## 2013-08-14 NOTE — ED Notes (Addendum)
Received instruction from Dr. Roderic Palau to contact respiratory therapy to have pt removed from Bi-Pap and tried on venti-mask.  Respiratory paged.

## 2013-08-14 NOTE — H&P (Signed)
PCP:   Alonza Bogus, MD   Chief Complaint:  sob  HPI: 68 yo male h/o chf, copd, on 2 L oxygen at home prn went to see dr Luan Pulling today for routine follow up appt when he suddenly got very sob and tachypneic.  Was feeling fine prior to that .  No fevers.  No cp.  Has some le edema but nothing different than his chronic le edema.  No coughing.  He was placed on bipap and had much improvement to his breathing.  He feels much better now after given lasix 160mg  and bipap.  Review of Systems:  Positive and negative as per HPI otherwise all other systems are negative  Past Medical History: Past Medical History  Diagnosis Date  . Hypertension   . CAD (coronary artery disease) 2003    [name]. CABG/MVR/AVR-2003 (#25 St. Jude/#21 St. Jude); anticoagulation; b. negative stress nuclear-2005;  c. 01/2013 NSTEMI/Cath/PCI: LM nl, LAD 50p, 40-67m, D1 50ost, LCX 39m, RCA 50/30m (2.5x20 Promus DES), 50d, VG->Diag 100, VG->PDA 100, VG->OM 100, LIMA->LAD ok.  . Epistaxis     requiring cautery & aterial ligation-09/2009  . GERD (gastroesophageal reflux disease)   . Hyperlipemia   . Abnormal LFTs     possible cirrhosis  . Chronic anticoagulation   . Valvular heart disease     [name]. 2003: MVR/AVR-2003 (#25 St. Jude/#21 St. Jude);  b. 01/2013 Echo: EF 55%, Mech AVR mean grad 12, Mech MVR mean grad 6.  . Tobacco abuse     45 pack years  . Fasting hyperglycemia   . Nephrolithiasis   . Diverticulosis   . Hyponatremia   . Chronic diastolic CHF (congestive heart failure)   . Chronic renal disease   . Hemolytic anemia     history of  . ETOH abuse   . [name]-fib 01/16/2013    [name]. on coumadin  . COPD (chronic obstructive pulmonary disease)     CONTROLED  . Hx of echocardiogram     [name]. Echo (07/2013):  EF 50-55%, normal MVR/AVR, mod LAE.   Past Surgical History  Procedure Laterality Date  . Endocopic sphenopalatine artery ligation & cautry    . Inguinal hernia repair      Left & right  . Coronary artery bypass  graft  11/2001    [facility]  . Cardiac valve replacement  11/2001    AVR and MVR-St. Jude devices  . Cataract extraction w/phaco  01/20/2011    Procedure: CATARACT EXTRACTION PHACO AND INTRAOCULAR LENS PLACEMENT (IOC);  Surgeon: Elta Guadeloupe T. Gershon Crane;  Location: [facility];  Service: Ophthalmology;  Laterality: Right;  CDE: 8.51  . Cataract extraction w/phaco  02/03/2011    Procedure: CATARACT EXTRACTION PHACO AND INTRAOCULAR LENS PLACEMENT (IOC);  Surgeon: Elta Guadeloupe T. Gershon Crane;  Location: [facility];  Service: Ophthalmology;  Laterality: Left;  CDE:10.01  . Colonoscopy  04/05/02; 08/2011    friable anal canal hemorrhoids otherwise normal; 2 diminutive polyps excised, minimal diverticulosis noted  . Esophagogastroduodenoscopy  01/2002    Dr. Laural Golden, submucosal esophageal lesion c/w leiomyoma  . Colonoscopy  09/02/2011    Procedure: COLONOSCOPY;  Surgeon: Daneil Dolin, MD;  Location: [facility];  Service: Endoscopy;  Laterality: N/[name];  8:15  . Yag laser application Right 73/22/0254    Procedure: YAG LASER APPLICATION;  Surgeon: Elta Guadeloupe T. Gershon Crane, MD;  Location: [facility];  Service: Ophthalmology;  Laterality: Right;    Medications: Prior to Admission medications   Medication Sig Start Date End Date Taking? Authorizing  Provider  amLODipine (NORVASC) 10 MG tablet Take 10 mg by mouth at bedtime.   Yes Historical Provider, MD  atorvastatin (LIPITOR) 40 MG tablet TAKE ONE TABLET BY MOUTH EVERY DAY. **THIS REPLACES ZOCOR (SIMVASTATIN)** 07/31/13  Yes Satira Sark, MD  clopidogrel (PLAVIX) 75 MG tablet Take 1 tablet (75 mg total) by mouth daily with breakfast. 07/03/13  Yes Satira Sark, MD  ferrous sulfate 325 (65 FE) MG EC tablet Take 325 mg by mouth daily with breakfast.   Yes Historical Provider, MD  folic acid (FOLVITE) 1 MG tablet Take 1 tablet (1 mg total) by mouth daily. 01/22/13  Yes Brooke O Edmisten, PA-C  furosemide (LASIX) 20 MG tablet Take 3 tablets (60 mg total) by mouth 2 (two) times daily.  08/03/13  Yes Lendon Colonel, NP  levalbuterol Penne Lash) 0.63 MG/3ML nebulizer solution Take 3 mLs (0.63 mg total) by nebulization every 6 (six) hours as needed for wheezing or shortness of breath. 01/22/13  Yes Brooke O Edmisten, PA-C  magnesium oxide (MAG-OX) 400 (241.3 MG) MG tablet Take 1 tablet (400 mg total) by mouth daily. 01/31/13  Yes Rogelia Mire, NP  metoprolol (LOPRESSOR) 100 MG tablet Take 1 tablet (100 mg total) by mouth 2 (two) times daily. 07/03/13  Yes Satira Sark, MD  Multiple Vitamin (MULTIVITAMIN WITH MINERALS) TABS Take 1 tablet by mouth daily. 01/22/13  Yes Brooke O Edmisten, PA-C  pantoprazole (PROTONIX) 40 MG tablet Take 1 tablet (40 mg total) by mouth daily. 01/31/13  Yes Rogelia Mire, NP  potassium chloride SA (K-DUR,KLOR-CON) 20 MEQ tablet Take 20 mEq by mouth daily.   Yes Historical Provider, MD  thiamine 100 MG tablet Take 1 tablet (100 mg total) by mouth daily. 01/22/13  Yes Brooke O Edmisten, PA-C  warfarin (COUMADIN) 5 MG tablet Take 1 1/2 tablets (7.5 mg) on Mondays and  Fridays  take 1 tablet (5 mg) on Sunday, Tuesday, Wednesday Thursday and Saturday 07/31/13  Yes Lendon Colonel, NP  acetaminophen (TYLENOL) 325 MG tablet Take 2 tablets (650 mg total) by mouth every 6 (six) hours as needed. For pain 01/22/13   Azzie Roup Edmisten, PA-C  HYDROcodone-acetaminophen (NORCO) 10-325 MG per tablet Take 1 tablet by mouth every 6 (six) hours as needed (pain).    Historical Provider, MD  nitroGLYCERIN (NITROSTAT) 0.4 MG SL tablet Place 1 tablet (0.4 mg total) under the tongue every 5 (five) minutes x 3 doses as needed for chest pain. 01/31/13   Rogelia Mire, NP    Allergies:  No Known Allergies  Social History:  reports that he has been smoking Cigarettes.  He has [name] 13 pack-year smoking history. He has never used smokeless tobacco. He reports that he drinks about 4.2 ounces of alcohol per week. He reports that he does not use illicit drugs.  Family  History: Family History  Problem Relation Age of Onset  . Hypotension Neg Hx   . Anesthesia problems Neg Hx   . Malignant hyperthermia Neg Hx   . Pseudochol deficiency Neg Hx   . Colon cancer Neg Hx   . Liver disease Neg Hx     Physical Exam: Filed Vitals:   08/14/13 1800 08/14/13 1900 08/14/13 1930 08/14/13 2000  BP: 165/71 143/56  145/53  Pulse: 80 66  69  Resp: 23 18  19   SpO2: 94% 90% 97% 96%   General appearance: alert, cooperative and no distress Head: Normocephalic, without obvious abnormality, atraumatic Eyes: negative Nose: Nares  normal. Septum midline. Mucosa normal. No drainage or sinus tenderness. Neck: no JVD and supple, symmetrical, trachea midline Lungs: clear to auscultation bilaterally Heart: regular rate and rhythm, S1, S2 normal, no murmur, click, rub or gallop Abdomen: soft, non-tender; bowel sounds normal; no masses,  no organomegaly Extremities: edema trace edema ble Pulses: 2+ and symmetric Skin: Skin color, texture, turgor normal. No rashes or lesions Neurologic: Grossly normal    Labs on Admission:   Recent Labs  08/14/13 1637  NA 134*  K 4.1  CL 94*  CO2 29  GLUCOSE 124*  BUN 37*  CREATININE 1.12  CALCIUM 9.3    Recent Labs  08/14/13 1637  AST 28  ALT 16  ALKPHOS 145*  BILITOT 0.6  PROT 8.4*  ALBUMIN 4.0    Recent Labs  08/14/13 1637  WBC 10.5  NEUTROABS 8.7*  HGB 11.3*  HCT 34.2*  MCV 99.1  PLT 389    Recent Labs  08/14/13 1637  TROPONINI <0.30   Radiological Exams on Admission: Dg Chest 1 View  08/14/2013   CLINICAL DATA:  Respiratory distress, history coronary artery disease, smoking, hypertension, CHF  EXAM: CHEST - 1 VIEW  COMPARISON:  Portable exam [id number] hr compared to 07/26/2013  FINDINGS: Enlargement of cardiac silhouette post median sternotomy.  Pulmonary vascular congestion.  Diffuse interstitial infiltrates bilaterally consistent with pulmonary edema and CHF.  Atherosclerotic calcification aorta.  Unable  to exclude small bibasilar effusions.  No pneumothorax.  Bones demineralized.  IMPRESSION: CHF.   Electronically Signed   By: Lavonia Dana M.D.   On: 08/14/2013 16:56   Assessment/Plan  68 yo male with acute on chronic chf diastolic exacerbation  Principal Problem:   Acute on chronic diastolic CHF (congestive heart failure), NYHA class 4-  Improved with bipap and total of 160mg  iv lasix thus far (on 60mg  bid po at home).  Serial enzymes.  Ck echo in am.  Give lasix 80mg  iv again in 12 hours then reassess dosing.  Monitor renal function closely.  ntg paste placed.    Active Problems:   HYPONATREMIA, CHRONIC   CHRONIC OBSTRUCTIVE PULMONARY DISEASE   MITRAL VALVE REPLACEMENT, HX OF   Chronic anticoagulation   Hypertension   Acute respiratory failure with hypoxia   Atrial fibrillation  pcp dr Luan Pulling  [name] [name] 08/14/2013, 8:11 PM

## 2013-08-14 NOTE — Progress Notes (Signed)
Hospitalist on the floor to see patient. Hospitalist gave verbal order to d/c foley and take patient off ventimask and try him on 3L O2 per nasal cannula and check his O2 sats. Initially, O2 sats were 89% on 3L with hospitalist at the bedside. Sats came up to 93% after about 1 minute. Will continue to monitor.

## 2013-08-15 ENCOUNTER — Encounter (HOSPITAL_COMMUNITY): Payer: Self-pay | Admitting: Cardiology

## 2013-08-15 DIAGNOSIS — I5031 Acute diastolic (congestive) heart failure: Secondary | ICD-10-CM

## 2013-08-15 DIAGNOSIS — I2510 Atherosclerotic heart disease of native coronary artery without angina pectoris: Secondary | ICD-10-CM

## 2013-08-15 LAB — BASIC METABOLIC PANEL
BUN: 33 mg/dL — ABNORMAL HIGH (ref 6–23)
CO2: 30 meq/L (ref 19–32)
Calcium: 9.4 mg/dL (ref 8.4–10.5)
Chloride: 93 mEq/L — ABNORMAL LOW (ref 96–112)
Creatinine, Ser: 1.08 mg/dL (ref 0.50–1.35)
GFR calc Af Amer: 79 mL/min — ABNORMAL LOW (ref >90)
GFR, EST NON AFRICAN AMERICAN: 69 mL/min — AB (ref >90)
GLUCOSE: 149 mg/dL — AB (ref 70–99)
POTASSIUM: 4.7 meq/L (ref 3.7–5.3)
Sodium: 136 mEq/L — ABNORMAL LOW (ref 137–147)

## 2013-08-15 LAB — PROTIME-INR
INR: 2.27 — AB (ref 0.00–1.49)
Prothrombin Time: 24.3 seconds — ABNORMAL HIGH (ref 11.6–15.2)

## 2013-08-15 LAB — TROPONIN I: Troponin I: <0.3 ng/mL (ref <0.30)

## 2013-08-15 MED ORDER — FUROSEMIDE 40 MG PO TABS: 60.0000 mg | ORAL_TABLET | Freq: Two times a day (BID) | ORAL | Status: DC

## 2013-08-15 NOTE — Progress Notes (Signed)
UR chart review completed.  

## 2013-08-15 NOTE — Care Management Note (Addendum)
    Page 1 of 1   08/16/2013     11:07:15 AM   CARE MANAGEMENT NOTE 08/16/2013  Patient:  JENIEL, SLAUSON   Account Number:  [id number]  Date Initiated:  08/15/2013  Documentation initiated by:  Theophilus Kinds  Subjective/Objective Assessment:   Pt admitted from home with CHF. Pt lives alone and will return home at discharge. Pt is independent with ADL's. Pt has home O2 with [facility]. Pt still runs his own business.     Action/Plan:   No CM needs noted at this time.   Anticipated DC Date:  08/18/2013   Anticipated DC Plan:  [place]  CM consult      Choice offered to / List presented to:             Status of service:  Completed, signed off Medicare Important Message given?  YES (If response is "NO", the following Medicare IM given date fields will be blank) Date Medicare IM given:  08/16/2013 Date Additional Medicare IM given:    Discharge Disposition:  HOME/SELF CARE  Per UR Regulation:    If discussed at Long Length of Stay Meetings, dates discussed:    Comments:  08/16/13 [place], RN BSN CM Pt discharged home today. Pt refuses any HH and [facility] involvement at this time. No other CM needs noted.  08/15/13 [place], RN BSN CM

## 2013-08-15 NOTE — Consult Note (Signed)
CARDIOLOGY CONSULT NOTE   Patient ID: [name] MRN: [id number] DOB/AGE: 12/02/1945 68 y.o.  Admit Date: 08/14/2013 Referring Physician: Kari Baars MD Primary Physician: Fredirick Maudlin, MD Consulting Cardiologist: Nona Dell MD Primary Cardiologist: Dina Rich MD  Reason for Consultation: Shortness of breath and congestive heart failure  Clinical Summary [name] is a 68 y.o.male admitted from Dr. Juanetta Gosling' office in the setting of acute shortness of breath and hypoxia. He had the sudden onset of dyspnea with cyanosis and hypoxia, diaphoresis that occurred after her walking to the bathroom in the office. He states he felt well prior to this, he had not had any chest tightness. He denies medical non-compliance. He admits to drinking 6-7 beers daily and also pot of coffee.  CXR demonstrated CHF, progressive compared to abnormal chest x-ray in January.. In ER he was treated with lasix 80 mg IV X 2, , NTG paste and given IV steroid. Troponin is negative X 3. He has diuiresed 2 liters since admission.   He had recent hospitalization in January of 2015, with acute on  chronic diastolic CHF, episode of PEA, and NSTEMI in the setting of hypoxia. Also during that hospitalization he was found to be in atrial fib and underwent DCCV which was successful in converting him to NSR. He remains on coumadin.   No Known Allergies  Medications Scheduled Medications: . amLODipine  10 mg Oral QHS  . aspirin EC  81 mg Oral Daily  . clopidogrel  75 mg Oral Q breakfast  . folic acid  1 mg Oral Daily  . furosemide  80 mg Intravenous Daily  . furosemide  60 mg Oral BID  . magnesium oxide  400 mg Oral Daily  . metoprolol  100 mg Oral BID  . multivitamin with minerals  1 tablet Oral Daily  . nitroGLYCERIN  1 inch Topical Q8H  . pantoprazole  40 mg Oral Daily  . potassium chloride SA  20 mEq Oral Daily  . sodium chloride  3 mL Intravenous Q12H  . thiamine  100 mg Oral Daily  .  warfarin  5 mg Oral Custom  . warfarin  7.5 mg Oral Custom  . Warfarin - Physician Dosing Inpatient   Does not apply q1800    PRN Medications: sodium chloride, acetaminophen, HYDROcodone-acetaminophen, nitroGLYCERIN, ondansetron (ZOFRAN) IV, sodium chloride   Past Medical History  Diagnosis Date  . Essential hypertension, benign   . Coronary atherosclerosis of native coronary artery     a. CABG/MVR/AVR-2003 (#25 St. Jude/#21 St. Jude); anticoagulation; b. negative stress nuclear-2005;  c. 01/2013 NSTEMI/Cath/PCI: LM nl, LAD 50p, 40-11m, D1 50ost, LCX 59m, RCA 50/71m (2.5x20 Promus DES), 50d, VG->Diag 100, VG->PDA 100, VG->OM 100, LIMA->LAD ok.  . Epistaxis     requiring cautery & aterial ligation-09/2009  . GERD (gastroesophageal reflux disease)   . Hyperlipemia   . Abnormal LFTs     Possible cirrhosis  . Chronic anticoagulation   . Valvular heart disease     a. 2003: MVR/AVR-2003 (#25 St. Jude/#21 St. Jude);  b. 01/2013 Echo: EF 55%, Mech AVR mean grad 12, Mech MVR mean grad 6.  . Tobacco abuse     45 pack years  . Fasting hyperglycemia   . Nephrolithiasis   . Diverticulosis   . Hyponatremia   . Chronic diastolic CHF (congestive heart failure)   . Chronic renal disease   . Hemolytic anemia   . ETOH abuse   . A-fib 01/16/2013    a. On  coumadin DCCV 07/2013.   Marland Kitchen COPD (chronic obstructive pulmonary disease)   . Hx of echocardiogram     a. Echo (07/2013):  EF 50-55%, normal MVR/AVR, mod LAE.    Past Surgical History  Procedure Laterality Date  . Endocopic sphenopalatine artery ligation & cautry    . Inguinal hernia repair      Left & right  . Coronary artery bypass graft  11/2001    [facility]  . Cardiac valve replacement  11/2001    AVR and MVR-St. Jude devices  . Cataract extraction w/phaco  01/20/2011    Procedure: CATARACT EXTRACTION PHACO AND INTRAOCULAR LENS PLACEMENT (IOC);  Surgeon: Elta Guadeloupe T. Gershon Crane;  Location: [facility] ORS;  Service: Ophthalmology;  Laterality:  Right;  CDE: 8.51  . Cataract extraction w/phaco  02/03/2011    Procedure: CATARACT EXTRACTION PHACO AND INTRAOCULAR LENS PLACEMENT (IOC);  Surgeon: Elta Guadeloupe T. Gershon Crane;  Location: [facility] ORS;  Service: Ophthalmology;  Laterality: Left;  CDE:10.01  . Colonoscopy  04/05/02; 08/2011    friable anal canal hemorrhoids otherwise normal; 2 diminutive polyps excised, minimal diverticulosis noted  . Esophagogastroduodenoscopy  01/2002    Dr. Laural Golden, submucosal esophageal lesion c/w leiomyoma  . Colonoscopy  09/02/2011    Procedure: COLONOSCOPY;  Surgeon: Daneil Dolin, MD;  Location: [facility] SUITE;  Service: Endoscopy;  Laterality: N/A;  8:15  . Yag laser application Right 02/54/2706    Procedure: YAG LASER APPLICATION;  Surgeon: Elta Guadeloupe T. Gershon Crane, MD;  Location: [facility];  Service: Ophthalmology;  Laterality: Right;    Family History  Problem Relation Age of Onset  . Hypotension Neg Hx   . Anesthesia problems Neg Hx   . Malignant hyperthermia Neg Hx   . Pseudochol deficiency Neg Hx   . Colon cancer Neg Hx   . Liver disease Neg Hx     Social History [name] reports that he has been smoking Cigarettes.  He has a 13 pack-year smoking history. He has never used smokeless tobacco. [name] reports that he drinks about 4.2 ounces of alcohol per week.  Review of Systems Otherwise reviewed and negative except as outlined.  Physical Examination Blood pressure 163/53, pulse 68, temperature 97.9 F (36.6 C), temperature source Oral, resp. rate 16, height 5\' 6"  (1.676 m), weight 148 lb 3.2 oz (67.223 kg), SpO2 93.00%.  Intake/Output Summary (Last 24 hours) at 08/15/13 0954 Last data filed at 08/15/13 0605  Gross per 24 hour  Intake      0 ml  Output   2050 ml  Net  -2050 ml    Telemetry:  NSR  Comfortable at rest, seated in bed this morning. HEENT: Conjunctiva and lids normal, oropharynx clear with moist mucosa. Neck: Supple, no elevated JVP or carotid bruits, no thyromegaly. Lungs: Bibasilar  crackles R>L. No wheezes or coughing.. Cardiac: Regular rate and rhythm, no S3 crisp prosthetic valve clicks, no pericardial rub. Abdomen: Soft, nontender, no hepatomegaly, bowel sounds present. Extremities: Trace to 1+ edema, distal pulses 2+. Skin: Warm and dry. Musculoskeletal: No kyphosis. Neuropsychiatric: Alert and oriented x3, affect grossly appropriate.   Prior Cardiac Testing/Procedures  1. Echocardiogram:  Left ventricle: The cavity size was normal. Wall thickness was normal. Systolic function was normal. The estimated ejection fraction was in the range of 50% to 55%. - Aortic valve: Normal appearing mechanical AVR - Mitral valve: Normal appearing mechanical MVR Still with extensive MAC Valve area by pressure half-time: 2.47cm^2. - Left atrium: Moderately dilated. Some shadowing from MVR tech measurements  are off - Atrial septum: No defect or patent foramen ovale was identified.  2. Cardiac Cath with PCI 01/17/2013 PCI Data:  Vessel - mid RCA/Segment - 2  Percent Stenosis (pre) 99%  TIMI-flow 3  Stent 2.5 x 20 mm Promus drug-eluting stent  Percent Stenosis (post) 0%  TIMI-flow (post) 3  Final Conclusions:  1. Significant three-vessel coronary artery disease with occluded vein grafts. Patent LIMA to LAD. The culprit for non-ST elevation myocardial infarction is likely occluded SVG to diagonal. However, the native diagonal is large and does not seem to have obstructive disease. There is significant disease in proximal OM 3. However, the vessel is tortuous and calcified. Severe 99% stenosis in mid RCA at the bifurcation with a large RV branch.  2. Successful angioplasty and drug-eluting stent placement to the mid RCA.  Lab Results  Basic Metabolic Panel:  Recent Labs Lab 08/09/13 1009 08/14/13 1637 08/15/13 0339  NA 140 134* 136*  K 4.5 4.1 4.7  CL 99 94* 93*  CO2 31 29 30   GLUCOSE 77 124* 149*  BUN 29* 37* 33*  CREATININE 1.18 1.12 1.08  CALCIUM 9.4 9.3 9.4      Liver Function Tests:  Recent Labs Lab 08/14/13 1637  AST 28  ALT 16  ALKPHOS 145*  BILITOT 0.6  PROT 8.4*  ALBUMIN 4.0    CBC:  Recent Labs Lab 08/14/13 1637  WBC 10.5  NEUTROABS 8.7*  HGB 11.3*  HCT 34.2*  MCV 99.1  PLT 389    Cardiac Enzymes:  Recent Labs Lab 08/14/13 1637 08/14/13 2209 08/15/13 0339  TROPONINI <0.30 <0.30 <0.30    BNP: 5,977  Radiology: Dg Chest 1 View  08/14/2013   CLINICAL DATA:  Respiratory distress, history coronary artery disease, smoking, hypertension, CHF  EXAM: CHEST - 1 VIEW  COMPARISON:  Portable exam [id number] hr compared to 07/26/2013  FINDINGS: Enlargement of cardiac silhouette post median sternotomy.  Pulmonary vascular congestion.  Diffuse interstitial infiltrates bilaterally consistent with pulmonary edema and CHF.  Atherosclerotic calcification aorta.  Unable to exclude small bibasilar effusions.  No pneumothorax.  Bones demineralized.  IMPRESSION: CHF.   Electronically Signed   By: Lavonia Dana M.D.   On: 08/14/2013 16:56     ECG: Normal sinus rhythm Pulmonary disease pattern Incomplete right bundle branch block Left anterior fascicular block Nonspecific ST abnormality   Impression and Recommendations  1. Acute diastolic heart failure: Sudden onset with associated diaphoresis, hypoxia and cyanosis while in physicians office. He has diuresed 2 liters from 3 doses of IV lasix 80 mg. He has low normal LVEF per echo in January 2015. Will continue another day of IV lasix and then increase dose of po at home. Watch BMET for kidney fx. May need to consider changing to torsemide for better bioavailability.   2. CAD: Hx of CABG with AVR and MVR: PCI to RCA in July of 2014. Echocardiogram in  2015 demonstrated low normal EF. Will consider OP stress test to evaluate for progression of ischemia. ECG and cardiac enzymes argue against ACS at this time. Continue DAPT, and coumadin.  3. Hypertension; Elevated on admission but is now  close to normotensive with diureses.Continue ACE, amlodipine,   4. ETOH abuse: Ongoing beer 6 or more daily. Recommend cessation. Contributing to volume intake.   Signed: Phill Myron. Purcell Nails NP [facility] 08/15/2013, 9:54 AM Co-Sign MD   Attending note:  Patient seen and examined. Reviewed records and modified above note by Ms. Lawrence NP.  He presents after sudden onset shortness of breath associated with diaphoresis and hypoxemia while ambulating at a routine visit with Dr. Luan Pulling yesterday. Denies any chest tightness similar to prior angina. He does have significant fluid intake regularly including 6-7 beers and a pot of coffee daily, states he is compliant with his medications. Does have chronic leg edema which is typically mild. His cardiac markers argue against ACS. ECG also stable. He remains in sinus rhythm, no recent palpitations or evidence of recurrent atrial fibrillation. Would continue IV diuresis today, convert to oral tomorrow. Will consider setting up a followup Lexiscan Cardiolite as an outpatient to reassess ischemic burden.  Satira Sark, M.D., F.A.C.C.

## 2013-08-15 NOTE — Progress Notes (Signed)
Patient has two orders for Lasix - one po and one IV.  RN paged K. Purcell Nails, NP.  Returned page and gave order to discontinue po Lasix and continue IV Lasix 80 mg daily and will reassess tomorrow.

## 2013-08-16 LAB — BASIC METABOLIC PANEL
BUN: 33 mg/dL — ABNORMAL HIGH (ref 6–23)
CALCIUM: 9 mg/dL (ref 8.4–10.5)
CO2: 30 meq/L (ref 19–32)
Chloride: 96 mEq/L (ref 96–112)
Creatinine, Ser: 1.16 mg/dL (ref 0.50–1.35)
GFR calc Af Amer: 73 mL/min — ABNORMAL LOW (ref >90)
GFR calc non Af Amer: 63 mL/min — ABNORMAL LOW (ref >90)
GLUCOSE: 134 mg/dL — AB (ref 70–99)
Potassium: 3.8 mEq/L (ref 3.7–5.3)
SODIUM: 138 meq/L (ref 137–147)

## 2013-08-16 LAB — PROTIME-INR
INR: 3.24 — AB (ref 0.00–1.49)
Prothrombin Time: 31.9 seconds — ABNORMAL HIGH (ref 11.6–15.2)

## 2013-08-16 MED ORDER — FUROSEMIDE 80 MG PO TABS: 80.0000 mg | ORAL_TABLET | Freq: Two times a day (BID) | ORAL | Status: DC

## 2013-08-16 MED ORDER — FUROSEMIDE 80 MG PO TABS
80.0000 mg | ORAL_TABLET | Freq: Two times a day (BID) | ORAL | Status: DC
  Administered 2013-08-16: 80 mg via ORAL
  Filled 2013-08-16: qty 1

## 2013-08-16 NOTE — Plan of Care (Signed)
Problem: COPD GOLD Progrssion Goal: Arrange DC Appointments for Pulmonary Clinic & PCP Arrange Discharge Appointments for Pulmonary Clinic and Primary Care Physician  Outcome: Completed/Met Date Met:  08/16/13 Patient to call,and set up appointment with PCP,who then will provide Pulmonary follow up.

## 2013-08-16 NOTE — Progress Notes (Signed)
     Subjective:  Breathing back to baseline.  Objective:  Vital Signs in the last 24 hours: Temp:  [97.9 F (36.6 C)-98.3 F (36.8 C)] 97.9 F (36.6 C) (02/11 0628) Pulse Rate:  [58-76] 58 (02/11 0628) Resp:  [18-20] 20 (02/11 0628) BP: (140-148)/(53-70) 140/62 mmHg (02/11 0628) SpO2:  [94 %-98 %] 98 % (02/11 0628) Weight:  [148 lb 7.7 oz (67.35 kg)] 148 lb 7.7 oz (67.35 kg) (02/11 0500)  Intake/Output from previous day: 02/10 0701 - 02/11 0700 In: 720 [P.O.:720] Out: 1775 [Urine:1775]  Physical Exam: NECK: Without JVD, HJR, or bruit LUNGS:Decreased breath sounds throughout but Clear anterior, posterior, lateral HEART: Regular rate and rhythm, crisp valvular clicks, no gallop, rub, bruit, thrill, or heave EXTREMITIES: trace ankle edema bilaterally otherwiseWithout cyanosis, clubbing  Lab Results:  Recent Labs  08/14/13 1637  WBC 10.5  HGB 11.3*  PLT 389    Recent Labs  08/15/13 0339 08/16/13 0450  NA 136* 138  K 4.7 3.8  CL 93* 96  CO2 30 30  GLUCOSE 149* 134*  BUN 33* 33*  CREATININE 1.08 1.16    Recent Labs  08/15/13 0339 08/15/13 0948  TROPONINI <0.30 <0.30   Hepatic Function Panel  Recent Labs  08/14/13 1637  PROT 8.4*  ALBUMIN 4.0  AST 28  ALT 16  ALKPHOS 145*  BILITOT 0.6    Assessment/Plan:   1. Acute diastolic heart failure: Has diuresed and feels back to baseline. He has low normal LVEF per echo in January 2015. Was on Lasix 60mg  po BID at home. Will change to 80mg  po BID today. Consider outpatient stress Myoview.  2. CAD: Hx of CABG with AVR and MVR: PCI to RCA in July of 2014. Echocardiogram in 2015 demonstrated low normal EF. Will consider OP stress test to evaluate for progression of ischemia. ECG and cardiac enzymes argue against ACS at this time. Continue DAPT, and coumadin.  3. Hypertension; Stable.  4. ETOH abuse: Ongoing beer 6 or more daily. Recommend cessation. Contributing to volume intake.    5. Atrial  fibrillation S/P [facility] 07/2013  maintaining NSR, remains on coumadin.   LOS: 2 days   Ermalinda Barrios 08/16/2013, 8:15 AM   Attending note:  Patient seen and examined. Reviewed recent progress. He reports feeling back to baseline, no breathlessness while ambulating in his room, no chest tightness. Cardiac markers argue against ACS. He has had a nice diuresis of greater than 3 L since admission. Our plan is to have him ambulate in hall, DC nitroglycerin paste, convert Lasix to 80 mg twice daily for home use. If he does well, suspect he could go home later this afternoon, and we will arrange a followup outpatient Lexiscan Cardiolite to assess ischemic burden. Office follow with Dr. Harl Bowie.  Satira Sark, M.D., F.A.C.C.

## 2013-08-16 NOTE — Plan of Care (Signed)
Problem: COPD GOLD Progrssion Goal: ABLE TO WEAN TO ROOM AIR Outcome: Completed/Met Date Met:  08/16/13 Room air saturation 92%.

## 2013-08-16 NOTE — Progress Notes (Signed)
Subjective: He says he feels well. He is going to be switched to oral furosemide today. He has not had any chest pain. He was able to sleep lying flat last night.  Objective: Vital signs in last 24 hours: Temp:  [97.9 F (36.6 C)-98.3 F (36.8 C)] 97.9 F (36.6 C) (02/11 0628) Pulse Rate:  [58-76] 58 (02/11 0628) Resp:  [18-20] 20 (02/11 0628) BP: (140-148)/(53-70) 140/62 mmHg (02/11 0628) SpO2:  [94 %-98 %] 98 % (02/11 0628) Weight:  [67.35 kg (148 lb 7.7 oz)] 67.35 kg (148 lb 7.7 oz) (02/11 0500) Weight change: -0.85 kg (-1 lb 14 oz) Last BM Date: 08/14/13  Intake/Output from previous day: 02/10 0701 - 02/11 0700 In: 720 [P.O.:720] Out: 1775 [Urine:1775]  PHYSICAL EXAM General appearance: alert, cooperative and no distress Resp: clear to auscultation bilaterally Cardio: He has prosthetic heart valve sounds but no gallop. GI: soft, non-tender; bowel sounds normal; no masses,  no organomegaly Extremities: extremities normal, atraumatic, no cyanosis or edema  Lab Results:    Basic Metabolic Panel:  Recent Labs  94/70/96 0339 08/16/13 0450  NA 136* 138  K 4.7 3.8  CL 93* 96  CO2 30 30  GLUCOSE 149* 134*  BUN 33* 33*  CREATININE 1.08 1.16  CALCIUM 9.4 9.0   Liver Function Tests:  Recent Labs  08/14/13 1637  AST 28  ALT 16  ALKPHOS 145*  BILITOT 0.6  PROT 8.4*  ALBUMIN 4.0   No results found for this basename: LIPASE, AMYLASE,  in the last 72 hours No results found for this basename: AMMONIA,  in the last 72 hours CBC:  Recent Labs  08/14/13 1637  WBC 10.5  NEUTROABS 8.7*  HGB 11.3*  HCT 34.2*  MCV 99.1  PLT 389   Cardiac Enzymes:  Recent Labs  08/14/13 2209 08/15/13 0339 08/15/13 0948  TROPONINI <0.30 <0.30 <0.30   BNP:  Recent Labs  08/14/13 1637  PROBNP 5977.0*   D-Dimer: No results found for this basename: DDIMER,  in the last 72 hours CBG: No results found for this basename: GLUCAP,  in the last 72 hours Hemoglobin  A1C: No results found for this basename: HGBA1C,  in the last 72 hours Fasting Lipid Panel: No results found for this basename: CHOL, HDL, LDLCALC, TRIG, CHOLHDL, LDLDIRECT,  in the last 72 hours Thyroid Function Tests: No results found for this basename: TSH, T4TOTAL, FREET4, T3FREE, THYROIDAB,  in the last 72 hours Anemia Panel: No results found for this basename: VITAMINB12, FOLATE, FERRITIN, TIBC, IRON, RETICCTPCT,  in the last 72 hours Coagulation:  Recent Labs  08/15/13 0339 08/16/13 0450  LABPROT 24.3* 31.9*  INR 2.27* 3.24*   Urine Drug Screen: Drugs of Abuse  No results found for this basename: labopia, cocainscrnur, labbenz, amphetmu, thcu, labbarb    Alcohol Level: No results found for this basename: ETH,  in the last 72 hours Urinalysis:  Recent Labs  08/14/13 1720  COLORURINE YELLOW  LABSPEC 1.015  PHURINE 5.5  GLUCOSEU NEGATIVE  HGBUR TRACE*  BILIRUBINUR NEGATIVE  KETONESUR NEGATIVE  PROTEINUR 30*  UROBILINOGEN 0.2  NITRITE NEGATIVE  LEUKOCYTESUR NEGATIVE   Misc. Labs:  ABGS No results found for this basename: PHART, PCO2, PO2ART, TCO2, HCO3,  in the last 72 hours CULTURES No results found for this or any previous visit (from the past 240 hour(s)). Studies/Results: Dg Chest 1 View  08/14/2013   CLINICAL DATA:  Respiratory distress, history coronary artery disease, smoking, hypertension, CHF  EXAM: CHEST -  1 VIEW  COMPARISON:  Portable exam 5726 hr compared to 07/26/2013  FINDINGS: Enlargement of cardiac silhouette post median sternotomy.  Pulmonary vascular congestion.  Diffuse interstitial infiltrates bilaterally consistent with pulmonary edema and CHF.  Atherosclerotic calcification aorta.  Unable to exclude small bibasilar effusions.  No pneumothorax.  Bones demineralized.  IMPRESSION: CHF.   Electronically Signed   By: Lavonia Dana M.D.   On: 08/14/2013 16:56    Medications:  Prior to Admission:  Prescriptions prior to admission  Medication Sig  Dispense Refill  . amLODipine (NORVASC) 10 MG tablet Take 10 mg by mouth at bedtime.      Marland Kitchen atorvastatin (LIPITOR) 40 MG tablet TAKE ONE TABLET BY MOUTH EVERY DAY. **THIS REPLACES ZOCOR (SIMVASTATIN)**  30 tablet  3  . clopidogrel (PLAVIX) 75 MG tablet Take 1 tablet (75 mg total) by mouth daily with breakfast.  30 tablet  6  . ferrous sulfate 325 (65 FE) MG EC tablet Take 325 mg by mouth daily with breakfast.      . folic acid (FOLVITE) 1 MG tablet Take 1 tablet (1 mg total) by mouth daily.  30 tablet  4  . furosemide (LASIX) 20 MG tablet Take 3 tablets (60 mg total) by mouth 2 (two) times daily.  180 tablet  2  . levalbuterol (XOPENEX) 0.63 MG/3ML nebulizer solution Take 3 mLs (0.63 mg total) by nebulization every 6 (six) hours as needed for wheezing or shortness of breath.  3 mL  4  . magnesium oxide (MAG-OX) 400 (241.3 MG) MG tablet Take 1 tablet (400 mg total) by mouth daily.  30 tablet  6  . metoprolol (LOPRESSOR) 100 MG tablet Take 1 tablet (100 mg total) by mouth 2 (two) times daily.  60 tablet  6  . Multiple Vitamin (MULTIVITAMIN WITH MINERALS) TABS Take 1 tablet by mouth daily.      . pantoprazole (PROTONIX) 40 MG tablet Take 1 tablet (40 mg total) by mouth daily.  30 tablet  6  . potassium chloride SA (K-DUR,KLOR-CON) 20 MEQ tablet Take 20 mEq by mouth daily.      Marland Kitchen thiamine 100 MG tablet Take 1 tablet (100 mg total) by mouth daily.  30 tablet  4  . warfarin (COUMADIN) 5 MG tablet Take 1 1/2 tablets (7.5 mg) on Mondays and  Fridays  take 1 tablet (5 mg) on Sunday, Tuesday, Wednesday Thursday and Saturday      . acetaminophen (TYLENOL) 325 MG tablet Take 2 tablets (650 mg total) by mouth every 6 (six) hours as needed. For pain  30 tablet  0  . HYDROcodone-acetaminophen (NORCO) 10-325 MG per tablet Take 1 tablet by mouth every 6 (six) hours as needed (pain).      . nitroGLYCERIN (NITROSTAT) 0.4 MG SL tablet Place 1 tablet (0.4 mg total) under the tongue every 5 (five) minutes x 3 doses as  needed for chest pain.  25 tablet  3   Scheduled: . amLODipine  10 mg Oral QHS  . aspirin EC  81 mg Oral Daily  . clopidogrel  75 mg Oral Q breakfast  . folic acid  1 mg Oral Daily  . furosemide  80 mg Oral BID  . magnesium oxide  400 mg Oral Daily  . metoprolol  100 mg Oral BID  . multivitamin with minerals  1 tablet Oral Daily  . nitroGLYCERIN  1 inch Topical 3 times per day  . pantoprazole  40 mg Oral Daily  . potassium chloride SA  20 mEq Oral Daily  . sodium chloride  3 mL Intravenous Q12H  . thiamine  100 mg Oral Daily  . warfarin  5 mg Oral Once per day on Sun Tue Wed Thu Sat  . warfarin  7.5 mg Oral Once per day on Mon Fri  . Warfarin - Physician Dosing Inpatient   Does not apply q1800   Continuous:  DJM:EQASTM chloride, acetaminophen, HYDROcodone-acetaminophen, nitroGLYCERIN, ondansetron (ZOFRAN) IV, sodium chloride  Assesment: He was admitted with pulmonary edema from acute on chronic diastolic CHF. He has other problems including COPD and he had respiratory failure with hypoxia as a combination of those problems. He has improved. Principal Problem:   Acute on chronic diastolic CHF (congestive heart failure), NYHA class 4 Active Problems:   HYPONATREMIA, CHRONIC   CHRONIC OBSTRUCTIVE PULMONARY DISEASE   MITRAL VALVE REPLACEMENT, HX OF   Chronic anticoagulation   Hypertension   Acute respiratory failure with hypoxia   Atrial fibrillation    Plan: Switch to oral diuresis. He may be able to be discharged later today but I would like him to ambulate in the halls and make sure he's okay before he is    LOS: 2 days   Sherley Mckenney L 08/16/2013, 8:31 AM

## 2013-08-16 NOTE — Progress Notes (Signed)
Discharged home today,with instructions given on medications,and follow up visits,verbalized understanding.No c/o pain or discomfort noted. Patient belongings given back to him, that had been placed in a safety lock box by security. Accompanied by staff to an awaiting vehicle.

## 2013-08-17 ENCOUNTER — Ambulatory Visit (INDEPENDENT_AMBULATORY_CARE_PROVIDER_SITE_OTHER): Payer: Medicare Other | Admitting: *Deleted

## 2013-08-17 ENCOUNTER — Other Ambulatory Visit: Payer: Self-pay | Admitting: *Deleted

## 2013-08-17 DIAGNOSIS — Z5181 Encounter for therapeutic drug level monitoring: Secondary | ICD-10-CM

## 2013-08-17 DIAGNOSIS — Z954 Presence of other heart-valve replacement: Secondary | ICD-10-CM

## 2013-08-17 DIAGNOSIS — Z9889 Other specified postprocedural states: Secondary | ICD-10-CM

## 2013-08-17 DIAGNOSIS — R079 Chest pain, unspecified: Secondary | ICD-10-CM

## 2013-08-17 LAB — POCT INR: INR: 3.2

## 2013-08-19 NOTE — Discharge Summary (Signed)
Physician Discharge Summary  Patient ID: [name] MRN: [id number] DOB/AGE: [birth year] 68 y.o. Primary Care Physician:Oktober Glazer L, MD Admit date: 08/14/2013 Discharge date: 08/19/2013    Discharge Diagnoses:   Principal Problem:   Acute on chronic diastolic CHF (congestive heart failure), NYHA class 4 Active Problems:   HYPONATREMIA, CHRONIC   ANEMIA   CHRONIC OBSTRUCTIVE PULMONARY DISEASE   MITRAL VALVE REPLACEMENT, HX OF   AORTIC VALVE REPLACEMENT, HX OF   Chronic anticoagulation   Coronary atherosclerosis of unspecified type of vessel, native or graft   Hypertension   Acute respiratory failure with hypoxia   Atrial fibrillation     Medication List         acetaminophen 325 MG tablet  Commonly known as:  TYLENOL  Take 2 tablets (650 mg total) by mouth every 6 (six) hours as needed. For pain     amLODipine 10 MG tablet  Commonly known as:  NORVASC  Take 10 mg by mouth at bedtime.     atorvastatin 40 MG tablet  Commonly known as:  LIPITOR  TAKE ONE TABLET BY MOUTH EVERY DAY. **THIS REPLACES ZOCOR (SIMVASTATIN)**     clopidogrel 75 MG tablet  Commonly known as:  PLAVIX  Take 1 tablet (75 mg total) by mouth daily with breakfast.     ferrous sulfate 325 (65 FE) MG EC tablet  Take 325 mg by mouth daily with breakfast.     folic acid 1 MG tablet  Commonly known as:  FOLVITE  Take 1 tablet (1 mg total) by mouth daily.     furosemide 80 MG tablet  Commonly known as:  LASIX  Take 1 tablet (80 mg total) by mouth 2 (two) times daily.     HYDROcodone-acetaminophen 10-325 MG per tablet  Commonly known as:  NORCO  Take 1 tablet by mouth every 6 (six) hours as needed (pain).     levalbuterol 0.63 MG/3ML nebulizer solution  Commonly known as:  XOPENEX  Take 3 mLs (0.63 mg total) by nebulization every 6 (six) hours as needed for wheezing or shortness of breath.     magnesium oxide 400 (241.3 MG) MG tablet  Commonly known as:  MAG-OX  Take 1 tablet (400 mg  total) by mouth daily.     metoprolol 100 MG tablet  Commonly known as:  LOPRESSOR  Take 1 tablet (100 mg total) by mouth 2 (two) times daily.     multivitamin with minerals Tabs tablet  Take 1 tablet by mouth daily.     nitroGLYCERIN 0.4 MG SL tablet  Commonly known as:  NITROSTAT  Place 1 tablet (0.4 mg total) under the tongue every 5 (five) minutes x 3 doses as needed for chest pain.     pantoprazole 40 MG tablet  Commonly known as:  PROTONIX  Take 1 tablet (40 mg total) by mouth daily.     potassium chloride SA 20 MEQ tablet  Commonly known as:  K-DUR,KLOR-CON  Take 20 mEq by mouth daily.     thiamine 100 MG tablet  Take 1 tablet (100 mg total) by mouth daily.     warfarin 5 MG tablet  Commonly known as:  COUMADIN  Take 1 1/2 tablets (7.5 mg) on Mondays and  Fridays  take 1 tablet (5 mg) on Sunday, Tuesday, Wednesday Thursday and Saturday        Discharged Condition: Improved    Consults: Cardiology  Significant Diagnostic Studies: Dg Chest 1 View  08/14/2013   CLINICAL DATA:  Respiratory distress, history coronary artery disease, smoking, hypertension, CHF  EXAM: CHEST - 1 VIEW  COMPARISON:  Portable exam 4332 hr compared to 07/26/2013  FINDINGS: Enlargement of cardiac silhouette post median sternotomy.  Pulmonary vascular congestion.  Diffuse interstitial infiltrates bilaterally consistent with pulmonary edema and CHF.  Atherosclerotic calcification aorta.  Unable to exclude small bibasilar effusions.  No pneumothorax.  Bones demineralized.  IMPRESSION: CHF.   Electronically Signed   By: Lavonia Dana M.D.   On: 08/14/2013 16:56   Dg Chest Port 1 View  07/26/2013   CLINICAL DATA:  Acute respiratory failure  EXAM: PORTABLE CHEST - 1 VIEW  COMPARISON:  July 24, 2013  FINDINGS: Endotracheal tube and nasogastric tube have been removed. The left jugular catheter tip is in the left innominate vein near the junction with the superior vena cava. No pneumothorax. There is  increase in interstitial and patchy alveolar edema compared to 2 days prior. There is cardiomegaly with small effusions. There are prosthetic mitral and aortic valves. No adenopathy.  IMPRESSION: Congestive heart failure with increased edema.  No pneumothorax.   Electronically Signed   By: Lowella Grip M.D.   On: 07/26/2013 07:33   Dg Chest Port 1 View  07/24/2013   CLINICAL DATA:  Ventilator  EXAM: PORTABLE CHEST - 1 VIEW  COMPARISON:  07/24/2013  FINDINGS: Endotracheal tube remains in good position. NG tube tip not well seen but may be in the distal esophagus. Left jugular catheter tip in the SVC in unchanged. No pneumothorax  Diffuse bilateral airspace disease shows mild interval improvement. Possible edema.  IMPRESSION: Mild improvement in diffuse bilateral airspace disease consistent with edema.   Electronically Signed   By: Franchot Gallo M.D.   On: 07/24/2013 14:13   Dg Chest Port 1 View  07/24/2013   CLINICAL DATA:  Acute respiratory failure. On ventilator. Congestive heart failure. Coronary artery disease.  EXAM: PORTABLE CHEST - 1 VIEW  COMPARISON:  07/23/2013  FINDINGS: Support lines and tubes in appropriate position. Diffuse bilateral airspace disease and probable layering bilateral pleural effusion show no significant change. Cardiomegaly is stable.  IMPRESSION: No significant change compared with prior exam.   Electronically Signed   By: Earle Gell M.D.   On: 07/24/2013 07:17   Dg Chest Port 1 View  07/23/2013   CLINICAL DATA:  Central line placement.  EXAM: PORTABLE CHEST - 1 VIEW  COMPARISON:  Chest x-ray 08/25/2013.  FINDINGS: An endotracheal tube is in place with tip 4.8 cm above the carina. A nasogastric tube is seen extending into the stomach, however, the tip of the nasogastric tube extends below the lower margin of the image. There is a left-sided internal jugular central venous catheter with tip terminating in the proximal superior vena cava. Lung volumes are slightly low. There  continues to be widespread interstitial and airspace disease throughout the lungs bilaterally, with some cephalization of the pulmonary vasculature, most compatible with moderate pulmonary edema. Overall, this is slightly improved compared to yesterday's examination. Probable trace bilateral pleural effusions. Heart size remains mildly enlarged. The patient is rotated to the right on today's exam, resulting in distortion of the mediastinal contours and reduced diagnostic sensitivity and specificity for mediastinal pathology. Atherosclerosis in the thoracic aorta. Status post median sternotomy for CABG and mechanical mitral and aortic valve replacements.  IMPRESSION: 1. Overall, there appears to be slightly improved aeration compared to yesterday's examination, however, there is still evidence for moderate congestive heart failure. 2. Support apparatus and postoperative changes,  as above.   Electronically Signed   By: Vinnie Langton M.D.   On: 07/23/2013 14:22   Dg Chest Portable 1 View  07/23/2013   CLINICAL DATA:  Endotracheal tube placement  EXAM: PORTABLE CHEST - 1 VIEW  COMPARISON:  05/29/2013  FINDINGS: Moderate cardiac enlargement. Status post CABG and replacement cardiac valve placement. Endotracheal tube is seen with tip 3.9 cm above the carina. There is vascular congestion in. There is moderate to severe interstitial prominence with more confluent alveolar opacities in the mid lung zones, right worse than left.  IMPRESSION: Endotracheal tube as described. Congestive heart failure with moderate to severe pulmonary edema.   Electronically Signed   By: Skipper Cliche M.D.   On: 07/23/2013 10:10    Lab Results: Basic Metabolic Panel: No results found for this basename: NA, K, CL, CO2, GLUCOSE, BUN, CREATININE, CALCIUM, MG, PHOS,  in the last 72 hours Liver Function Tests: No results found for this basename: AST, ALT, ALKPHOS, BILITOT, PROT, ALBUMIN,  in the last 72 hours   CBC: No results found  for this basename: WBC, NEUTROABS, HGB, HCT, MCV, PLT,  in the last 72 hours  No results found for this or any previous visit (from the past 240 hour(s)).   Hospital Course: He was admitted after being in my office. He was initially doing well went to the bathroom and as he returned from the bathroom he developed acute severe shortness of breath. His respiratory rate went up into the 30s. He became cyanotic. He was clearly in respiratory distress. He was sent by ambulance to the emergency department where he was found to be in pulmonary edema. He was treated with intravenous diuresis and improved rapidly. He had cardiology consultation and it was felt that part of the problem was in the volume of fluid that he typically takes in. He was asked to reduce the amount of fluid he drinks. His Lasix was increased. By the time of discharge he was able to ambulate without any difficulty or shortness of breath or hypoxia.  Discharge Exam: Blood pressure 158/54, pulse 63, temperature 97.3 F (36.3 C), temperature source Oral, resp. rate 20, height 5\' 6"  (1.676 m), weight 67.35 kg (148 lb 7.7 oz), SpO2 97.00%. He is awake and alert. His chest is clear. He has prosthetic aortic and mitral valve sounds. He has no edema  Disposition: Home       Future Appointments Provider Department Dept Phone   08/29/2013 8:15 AM [facility] [place] [facility] [phone]   NPO after midnight   08/29/2013 9:00 AM [facility] [place] [facility] [phone]   08/29/2013 10:15 AM [facility] [place] [facility] [phone]   08/29/2013 10:15 AM [facility] [place] [facility] [phone]   NPO after midnight   08/29/2013 11:00 AM [facility] [place] [phone]   09/04/2013 1:00 PM Arnoldo Lenis, MD [place] [phone]   09/04/2013 1:50 PM [facility] [facility] [place] [id number]      Follow-up Information   Follow up with  [facility] On 08/29/2013. (Register at the Radiology Dept at 8:00 am for Stress test. Do not eat or drink after midnight.)    Contact information:   [place] [address] [phone]      Signed: Alonza Bogus   08/19/2013, 9:03 AM

## 2013-08-29 ENCOUNTER — Encounter (HOSPITAL_COMMUNITY)
Admission: RE | Admit: 2013-08-29 | Discharge: 2013-08-29 | Disposition: A | Discharge status: Home / Self Care | Payer: Medicare Other | Source: Ambulatory Visit | Attending: Cardiology | Admitting: Cardiology

## 2013-08-29 ENCOUNTER — Encounter (HOSPITAL_COMMUNITY): Payer: Self-pay

## 2013-08-29 DIAGNOSIS — R079 Chest pain, unspecified: Secondary | ICD-10-CM | POA: Insufficient documentation

## 2013-08-29 DIAGNOSIS — I5033 Acute on chronic diastolic (congestive) heart failure: Secondary | ICD-10-CM

## 2013-08-29 DIAGNOSIS — I2510 Atherosclerotic heart disease of native coronary artery without angina pectoris: Secondary | ICD-10-CM

## 2013-08-29 MED ORDER — REGADENOSON 0.4 MG/5ML IV SOLN
INTRAVENOUS | Status: AC
  Administered 2013-08-29: 0.4 mg via INTRAVENOUS
  Filled 2013-08-29: qty 5

## 2013-08-29 MED ORDER — TECHNETIUM TC 99M SESTAMIBI - CARDIOLITE
10.0000 | Freq: Once | INTRAVENOUS | Status: AC | PRN
  Administered 2013-08-29: 08:00:00 10 via INTRAVENOUS

## 2013-08-29 MED ORDER — SODIUM CHLORIDE 0.9 % IJ SOLN
INTRAMUSCULAR | Status: AC
  Administered 2013-08-29: 10 mL via INTRAVENOUS
  Filled 2013-08-29: qty 10

## 2013-08-29 MED ORDER — TECHNETIUM TC 99M SESTAMIBI GENERIC - CARDIOLITE
30.0000 | Freq: Once | INTRAVENOUS | Status: AC | PRN
  Administered 2013-08-29: 29 via INTRAVENOUS

## 2013-08-29 NOTE — Progress Notes (Signed)
Stress Lab Nurses Notes - [name]  [name] 08/29/2013 Reason for doing test: CAD, SOB, & CHF Type of test: [name] Nurse performing test: Gerrit Halls, RN Nuclear Medicine Tech: Melburn Hake Echo Tech: Not Applicable MD performing test: S. McDowell/K.Lawrence NP Family MD: Dr. Luan Pulling Test explained and consent signed: yes IV started: 22g jelco, Saline lock flushed, No redness or edema and Saline lock started in radiology Symptoms: SOB & stomach discomfort. Treatment/Intervention: None Reason test stopped: protocol completed After recovery IV was: Discontinued via X-ray tech and No redness or edema Patient to return to [name]. Med at : 10:15 Patient discharged: Home Patient's Condition upon discharge was: stable Comments: During test BP 133/56 & HR 81.  Recovery BP 152/63 & HR 72.  Symptoms resolved in recovery. [name] T

## 2013-09-04 ENCOUNTER — Ambulatory Visit (INDEPENDENT_AMBULATORY_CARE_PROVIDER_SITE_OTHER): Payer: Medicare Other | Admitting: Cardiology

## 2013-09-04 ENCOUNTER — Encounter: Payer: Self-pay | Admitting: Cardiology

## 2013-09-04 ENCOUNTER — Ambulatory Visit (INDEPENDENT_AMBULATORY_CARE_PROVIDER_SITE_OTHER): Payer: Medicare Other | Admitting: *Deleted

## 2013-09-04 VITALS — BP 135/70 | HR 76 | Ht 66.0 in | Wt 157.0 lb

## 2013-09-04 DIAGNOSIS — E785 Hyperlipidemia, unspecified: Secondary | ICD-10-CM

## 2013-09-04 DIAGNOSIS — I5032 Chronic diastolic (congestive) heart failure: Secondary | ICD-10-CM

## 2013-09-04 DIAGNOSIS — I38 Endocarditis, valve unspecified: Secondary | ICD-10-CM

## 2013-09-04 DIAGNOSIS — I4891 Unspecified atrial fibrillation: Secondary | ICD-10-CM

## 2013-09-04 DIAGNOSIS — I2510 Atherosclerotic heart disease of native coronary artery without angina pectoris: Secondary | ICD-10-CM

## 2013-09-04 DIAGNOSIS — Z954 Presence of other heart-valve replacement: Secondary | ICD-10-CM

## 2013-09-04 DIAGNOSIS — Z9889 Other specified postprocedural states: Secondary | ICD-10-CM

## 2013-09-04 DIAGNOSIS — Z5181 Encounter for therapeutic drug level monitoring: Secondary | ICD-10-CM

## 2013-09-04 LAB — POCT INR: INR: 3.3

## 2013-09-04 MED ORDER — TORSEMIDE 20 MG PO TABS: 40.0000 mg | ORAL_TABLET | Freq: Two times a day (BID) | ORAL | Status: DC

## 2013-09-04 NOTE — Progress Notes (Signed)
Clinical Summary Mr. Mon is a 68 y.o.male last seen by NP Lyman Bishop, this is our first visit together. He is seen for the following medical problems.  1. Diastolic heart failure - prior Jan 2015 admission with respiratory failure and hypoxia with related to PEA arrest in the setting of volume overload and COPD - recent admit 08/2013 with SOB and pulm edema, diuresed during that admit, discharge wt 148 lbs, Cr 1.08, BUN 33, K 4.7 - reports since discharge still notes SOB at that times with activity. DOE at < 1/2 block. Chronic orthopnea, sleeps at angle. Mild LE edema. - compliant with meds including lasix 80mg  bid. - wt up 148-->157 since discharge by our scales, he reports his home weight on his scale was 151 lbs..    2. CAD - history of prior CABG 2003 - cath 01/2013 in setting of NSTEMI: LM patent, LAD prox 50% and 50% mid, D1 50%, LCX 30% mid, RCA 50% mid with more distal 99% lesion. SVG to diag occluded, SVG to PDA occluded, SVG to OM occluded, LIMA to LAD patent. The RCA received a DES - compliant with plavix, has not been on ASA b/c also on coumadin to lower bleeding risk - no recent chest pain  3. Valvular heart disease - St Jude MVR and AVR, on chronic coumadin - echo 07/2013 with normal function of both prosthetic valves per report - compliant with coumadin  4. Afib - recent DCCV - denies any recent palpitations.  - on coumadin  5. Hyperlipidemia - last panel in our system 01/2013: TC 147 TG 47 HDL 64 LDL 74 - compliant with statin  Past Medical History  Diagnosis Date  . Essential hypertension, benign   . Coronary atherosclerosis of native coronary artery     a. CABG/MVR/AVR-2003 (#25 St. Jude/#21 St. Jude); anticoagulation; b. negative stress nuclear-2005;  c. 01/2013 NSTEMI/Cath/PCI: LM nl, LAD 50p, 40-86m, D1 50ost, LCX 86m, RCA 50/63m (2.5x20 Promus DES), 50d, VG->Diag 100, VG->PDA 100, VG->OM 100, LIMA->LAD ok.  . Epistaxis     requiring cautery & aterial  ligation-09/2009  . GERD (gastroesophageal reflux disease)   . Hyperlipemia   . Abnormal LFTs     Possible cirrhosis  . Chronic anticoagulation   . Valvular heart disease     a. 2003: MVR/AVR-2003 (#25 St. Jude/#21 St. Jude);  b. 01/2013 Echo: EF 55%, Mech AVR mean grad 12, Mech MVR mean grad 6.  . Tobacco abuse     45 pack years  . Fasting hyperglycemia   . Nephrolithiasis   . Diverticulosis   . Hyponatremia   . Chronic diastolic CHF (congestive heart failure)   . Chronic renal disease   . Hemolytic anemia   . ETOH abuse   . A-fib 01/16/2013    a. On coumadin DCCV 07/2013.   Marland Kitchen COPD (chronic obstructive pulmonary disease)   . Hx of echocardiogram     a. Echo (07/2013):  EF 50-55%, normal MVR/AVR, mod LAE.     No Known Allergies   Current Outpatient Prescriptions  Medication Sig Dispense Refill  . acetaminophen (TYLENOL) 325 MG tablet Take 2 tablets (650 mg total) by mouth every 6 (six) hours as needed. For pain  30 tablet  0  . amLODipine (NORVASC) 10 MG tablet Take 10 mg by mouth at bedtime.      Marland Kitchen atorvastatin (LIPITOR) 40 MG tablet TAKE ONE TABLET BY MOUTH EVERY DAY. **THIS REPLACES ZOCOR (SIMVASTATIN)**  30 tablet  3  .  clopidogrel (PLAVIX) 75 MG tablet Take 1 tablet (75 mg total) by mouth daily with breakfast.  30 tablet  6  . ferrous sulfate 325 (65 FE) MG EC tablet Take 325 mg by mouth daily with breakfast.      . folic acid (FOLVITE) 1 MG tablet Take 1 tablet (1 mg total) by mouth daily.  30 tablet  4  . furosemide (LASIX) 80 MG tablet Take 1 tablet (80 mg total) by mouth 2 (two) times daily.  60 tablet  12  . HYDROcodone-acetaminophen (NORCO) 10-325 MG per tablet Take 1 tablet by mouth every 6 (six) hours as needed (pain).      Marland Kitchen levalbuterol (XOPENEX) 0.63 MG/3ML nebulizer solution Take 3 mLs (0.63 mg total) by nebulization every 6 (six) hours as needed for wheezing or shortness of breath.  3 mL  4  . magnesium oxide (MAG-OX) 400 (241.3 MG) MG tablet Take 1 tablet (400  mg total) by mouth daily.  30 tablet  6  . metoprolol (LOPRESSOR) 100 MG tablet Take 1 tablet (100 mg total) by mouth 2 (two) times daily.  60 tablet  6  . Multiple Vitamin (MULTIVITAMIN WITH MINERALS) TABS Take 1 tablet by mouth daily.      . nitroGLYCERIN (NITROSTAT) 0.4 MG SL tablet Place 1 tablet (0.4 mg total) under the tongue every 5 (five) minutes x 3 doses as needed for chest pain.  25 tablet  3  . pantoprazole (PROTONIX) 40 MG tablet Take 1 tablet (40 mg total) by mouth daily.  30 tablet  6  . potassium chloride SA (K-DUR,KLOR-CON) 20 MEQ tablet Take 20 mEq by mouth daily.      Marland Kitchen thiamine 100 MG tablet Take 1 tablet (100 mg total) by mouth daily.  30 tablet  4  . warfarin (COUMADIN) 5 MG tablet Take 1 1/2 tablets (7.5 mg) on Mondays and  Fridays  take 1 tablet (5 mg) on Sunday, Tuesday, Wednesday Thursday and Saturday       No current facility-administered medications for this visit.     Past Surgical History  Procedure Laterality Date  . Endocopic sphenopalatine artery ligation & cautry    . Inguinal hernia repair      Left & right  . Coronary artery bypass graft  11/2001    [facility]  . Cardiac valve replacement  11/2001    AVR and MVR-St. Jude devices  . Cataract extraction w/phaco  01/20/2011    Procedure: CATARACT EXTRACTION PHACO AND INTRAOCULAR LENS PLACEMENT (IOC);  Surgeon: Elta Guadeloupe T. Gershon Crane;  Location: [facility];  Service: Ophthalmology;  Laterality: Right;  CDE: 8.51  . Cataract extraction w/phaco  02/03/2011    Procedure: CATARACT EXTRACTION PHACO AND INTRAOCULAR LENS PLACEMENT (IOC);  Surgeon: Elta Guadeloupe T. Gershon Crane;  Location: [facility];  Service: Ophthalmology;  Laterality: Left;  CDE:10.01  . Colonoscopy  04/05/02; 08/2011    friable anal canal hemorrhoids otherwise normal; 2 diminutive polyps excised, minimal diverticulosis noted  . Esophagogastroduodenoscopy  01/2002    Dr. Laural Golden, submucosal esophageal lesion c/w leiomyoma  . Colonoscopy  09/02/2011    Procedure:  COLONOSCOPY;  Surgeon: Daneil Dolin, MD;  Location: [facility];  Service: Endoscopy;  Laterality: N/A;  8:15  . Yag laser application Right [id number]    Procedure: YAG LASER APPLICATION;  Surgeon: Elta Guadeloupe T. Gershon Crane, MD;  Location: [facility];  Service: Ophthalmology;  Laterality: Right;     No Known Allergies    Family History  Problem Relation Age  of Onset  . Hypotension Neg Hx   . Anesthesia problems Neg Hx   . Malignant hyperthermia Neg Hx   . Pseudochol deficiency Neg Hx   . Colon cancer Neg Hx   . Liver disease Neg Hx      Social History Mr. Siefring reports that he has been smoking Cigarettes.  He has a 13 pack-year smoking history. He has never used smokeless tobacco. Mr. Wenner reports that he drinks about 4.2 ounces of alcohol per week.   Review of Systems CONSTITUTIONAL: No weight loss, fever, chills, weakness or fatigue.  HEENT: Eyes: No visual loss, blurred vision, double vision or yellow sclerae.No hearing loss, sneezing, congestion, runny nose or sore throat.  SKIN: No rash or itching.  CARDIOVASCULAR: per HPI RESPIRATORY: No shortness of breath, cough or sputum.  GASTROINTESTINAL: No anorexia, nausea, vomiting or diarrhea. No abdominal pain or blood.  GENITOURINARY: No burning on urination, no polyuria NEUROLOGICAL: No headache, dizziness, syncope, paralysis, ataxia, numbness or tingling in the extremities. No change in bowel or bladder control.  MUSCULOSKELETAL: No muscle, back pain, joint pain or stiffness.  LYMPHATICS: No enlarged nodes. No history of splenectomy.  PSYCHIATRIC: No history of depression or anxiety.  ENDOCRINOLOGIC: No reports of sweating, cold or heat intolerance. No polyuria or polydipsia.  Marland Kitchen   Physical Examination Gen: resting comfortably, no acute distress HEENT: no scleral icterus, pupils equal round and reactive, no palptable cervical adenopathy,  CV: RRR, mechanical S1/S2 Resp: Clear to auscultation bilaterally GI: abdomen is soft,  non-tender, non-distended, normal bowel sounds, no hepatosplenomegaly MSK: extremities are warm, 2+ lower extremity edema Skin: warm, no rash Neuro:  no focal deficits Psych: appropriate affect   Diagnostic Studies 07/2013 Echo LVEF 50-55%, normal AVR, normal MVR,   01/2013 Cath Hemodynamics:  AO: 145/59 mmHg  Coronary angiography:  Coronary dominance:  Left Main: Short in with no significant disease.  Left Anterior Descending (LAD): Normal in size with moderate calcifications proximally. There is 50% disease proximally just before the diagonal Laketta Soderberg. The vessel is very tortuous in the midsegment with diffuse 40-50% disease. There is competitive flow noted distally from a patent LIMA.  1st diagonal (D1): Large in size with 50% ostial and proximal disease. This vessel is large and supplies most of the anterolateral wall.  2nd diagonal (D2): Very small in size.  3rd diagonal (D3): Very small in size.  Circumflex (LCx): Normal in size and nondominant. The vessel is mildly calcified. There is 30 % tubular disease in the midsegment.  1st obtuse marginal: Very small in size with minor irregularities.  2nd obtuse marginal: Small in size with 60% ostial disease.  3rd obtuse marginal: Normal in size with 80% proximal disease. The vessel is very tortuous in that segment and distally.  AV groove continuation segment: Normal in size with minor irregularities.  Right Coronary Artery: Normal in size and dominant. The vessel is moderately calcified throughout its course. There is diffuse 50% disease in the midsegment. There is another lesion of 99% in the midsegment just before giving a large RV Cobin Cadavid. There is 50% disease distally.  Posterior descending artery: Normal in size with minor irregularities.  Posterior AV segment: Small in size. SVG to diagonal: Occluded proximally. I suspect that this is the culprit for non-ST elevation MI due to dye stain noted.  SVG to right PDA: Occluded proximally.    SVG to OM: Is occluded proximally.  LIMA to LAD: Patent with no significant disease.  Left ventriculography: Was not performed.  PCI Procedure Note: Following the diagnostic procedure, the decision was made to proceed with PCI. I reviewed the images with Dr. Burt Knack. The sheath was upsized to a 6 [place]. Weight-based bivalirudin was given for anticoagulation. Once a therapeutic ACT was achieved, a 6 [place] JR 4 guide catheter was inserted. A run through coronary guidewire was used to cross the lesion. The lesion was predilated with a 2.5 x 12 mm balloon. After crossing the lesion, there was no flow in the distal RCA likely due to severity of stenosis. There was sluggish flow or send RV Jaeden Messer. Thus, I used another run through a wire and placed in the RV Tambi Thole for protection. I then tried to advance a 2.5 x 20 mm Promus drug-eluting stent into the distal RCA but was not successful due to tortuosity and calcifications. This was in spite of having to I asked. I decided to predilate the lesion with a 2.5 noncompliant balloon which was done with multiple inflations. I was then able to advance the stent after deep seating the guide. The stent was deployed at 12 atmospheres. I then removed the wire from the RV Symphony Demuro and post dilated the stent with a 2.5 noncompliant balloon to 16 atmospheres. Following PCI, there was 0% residual stenosis and TIMI-3 flow. Final angiography confirmed an excellent result. There was still moderate disease in the mid RCA proximal to the stent which improved after removing the wires likely due to wire bias. This was left to be treated medically. Femoral hemostasis was achieved with a Mynx device. The patient tolerated the PCI procedure well. There were no immediate procedural complications. The patient was transferred to the post catheterization recovery area for further monitoring.  PCI Data:  Vessel - mid RCA/Segment - 2  Percent Stenosis (pre) 99%  TIMI-flow 3  Stent 2.5 x 20 mm  Promus drug-eluting stent  Percent Stenosis (post) 0%  TIMI-flow (post) 3  Final Conclusions:  1. Significant three-vessel coronary artery disease with occluded vein grafts. Patent LIMA to LAD. The culprit for non-ST elevation myocardial infarction is likely occluded SVG to diagonal. However, the native diagonal is large and does not seem to have obstructive disease. There is significant disease in proximal OM 3. However, the vessel is tortuous and calcified. Severe 99% stenosis in mid RCA at the bifurcation with a large RV Brittin Belnap.  2. Successful angioplasty and drug-eluting stent placement to the mid RCA.  Recommendations:  I recommend treatment with Plavix and warfarin alone without aspirin to minimize risk of bleeding. I will resume heparin 8 hours from now and resume warfarin as well. Obtain an echocardiogram to evaluate LV systolic function and the function of mechanical valves. Recommend rate control for atrial fibrillation. I increased the dose of metoprolol.  Recommend medical therapy for that disease in OM3 which is tortuous and calcified  08/17/13 Nuclear stress test Tomographic views were obtained using the short axis, vertical long axis, and horizontal long axis planes. There is a moderate-sized, mild to moderate intensity, reversible defect in the lateral wall that is consistent with ischemia. There is also a small, mild intensity, partially reversible inferior wall defect that is consistent with scar and associated mild peri-infarct ischemia.  Gated imaging reveals an EDV of 147, ESV of 90, TID ratio 0.99, and LVEF calculated at 39% with inferoposterior hypokinesis.  IMPRESSION: Intermediate risk Lexiscan Cardiolite. Nondiagnostic ST segment changes were noted, rare PVCs without sustained arrhythmia. Sinus rhythm was present throughout. Perfusion imaging is consistent with ischemia within the lateral wall  as outlined, combination of scar and mild peri-infarct ischemia in the  inferior wall. LVEF 39% with inferoposterior hypokinesis and overall normal LV volume. This is consistent with ischemic heart disease and associated cardiomyopathy.       Assessment and Plan  1. Chronic diastolic heart failure - multiple admissions over last few months with volume overload - will change furosemide to torsemide for more potent diuretic with greater bioavailability - if continues to have recurrent episodes on aggressive diuretic therapy, will need to consider ischemia as a possible exacerbating factor.  2. CAD  - no recent chest pain - recent MPI intermediate risk some areas of ischemia in the lateral wall and inferior wall. - continue medical management - continue DAPT at least until 01/2014 for recent DES  3. Valvular heart disease - prior MVR and AVR - recent echo with normal functioning prosthetic valves - continue coumadin.  4. Afib - no current symptoms - continue rate control and anticoagulation  5. Hyperlipidemia - at goal, continue current statin.   Follow up 1 month  Arnoldo Lenis, M.D., F.A.C.C.

## 2013-09-04 NOTE — Patient Instructions (Addendum)
Your physician recommends that you schedule a follow-up appointment in: 1 month   Your physician has recommended you make the following change in your medication:   STOP Lasix  Start Torsemide 40 mg twice a day  Thank you for choosing [facility] !

## 2013-09-12 ENCOUNTER — Telehealth: Payer: Self-pay | Admitting: Cardiology

## 2013-09-12 MED ORDER — TORSEMIDE 20 MG PO TABS: 40.0000 mg | ORAL_TABLET | Freq: Two times a day (BID) | ORAL | Status: DC

## 2013-09-12 MED ORDER — MAGNESIUM OXIDE 400 (241.3 MG) MG PO TABS: 400.0000 mg | ORAL_TABLET | Freq: Every day | ORAL | Status: DC

## 2013-09-12 NOTE — Telephone Encounter (Signed)
Needs refills on Mag-Ox and Torsemide sent to [facility] in [place]

## 2013-09-12 NOTE — Telephone Encounter (Signed)
Refill torsemide and magnesium refilled

## 2013-09-15 ENCOUNTER — Ambulatory Visit: Payer: Medicare Other | Admitting: Cardiology

## 2013-09-25 ENCOUNTER — Ambulatory Visit (INDEPENDENT_AMBULATORY_CARE_PROVIDER_SITE_OTHER): Payer: Medicare Other | Admitting: *Deleted

## 2013-09-25 DIAGNOSIS — Z5181 Encounter for therapeutic drug level monitoring: Secondary | ICD-10-CM

## 2013-09-25 DIAGNOSIS — Z9889 Other specified postprocedural states: Secondary | ICD-10-CM

## 2013-09-25 DIAGNOSIS — Z954 Presence of other heart-valve replacement: Secondary | ICD-10-CM

## 2013-09-25 LAB — POCT INR: INR: 4.9

## 2013-10-09 ENCOUNTER — Telehealth: Payer: Self-pay | Admitting: Cardiovascular Disease

## 2013-10-09 MED ORDER — PANTOPRAZOLE SODIUM 40 MG PO TBEC: 40.0000 mg | DELAYED_RELEASE_TABLET | Freq: Every day | ORAL | Status: DC

## 2013-10-09 NOTE — Telephone Encounter (Signed)
Received fax refill request  Rx # [id number] Medication:  Pantoprazole 40 mg tab Qty 30 Sig:  Take one tablet by mouth once daily Physician:  Bronson Ing

## 2013-10-09 NOTE — Telephone Encounter (Signed)
rf x 1 given pcp should refill any further per K.Purcell Nails NP

## 2013-10-11 ENCOUNTER — Ambulatory Visit (INDEPENDENT_AMBULATORY_CARE_PROVIDER_SITE_OTHER): Payer: Medicare Other | Admitting: Cardiology

## 2013-10-11 ENCOUNTER — Ambulatory Visit (INDEPENDENT_AMBULATORY_CARE_PROVIDER_SITE_OTHER): Payer: Medicare Other | Admitting: *Deleted

## 2013-10-11 VITALS — BP 148/52 | HR 65 | Ht 66.0 in | Wt 160.0 lb

## 2013-10-11 DIAGNOSIS — Z954 Presence of other heart-valve replacement: Secondary | ICD-10-CM

## 2013-10-11 DIAGNOSIS — Z5181 Encounter for therapeutic drug level monitoring: Secondary | ICD-10-CM

## 2013-10-11 DIAGNOSIS — Z9889 Other specified postprocedural states: Secondary | ICD-10-CM

## 2013-10-11 DIAGNOSIS — I38 Endocarditis, valve unspecified: Secondary | ICD-10-CM

## 2013-10-11 DIAGNOSIS — I2510 Atherosclerotic heart disease of native coronary artery without angina pectoris: Secondary | ICD-10-CM

## 2013-10-11 DIAGNOSIS — I5032 Chronic diastolic (congestive) heart failure: Secondary | ICD-10-CM

## 2013-10-11 DIAGNOSIS — E785 Hyperlipidemia, unspecified: Secondary | ICD-10-CM

## 2013-10-11 LAB — POCT INR: INR: 2.9

## 2013-10-11 NOTE — Patient Instructions (Signed)
Your physician recommends that you schedule a follow-up appointment in: 3 months with Dr. Branch. You should receive a letter in the mail in 1-2 months. If you do not receive this letter by May or June 2015 call our office to schedule this appointment.   Your physician recommends that you continue on your current medications as directed. Please refer to the Current Medication list given to you today.   

## 2013-10-11 NOTE — Progress Notes (Signed)
Clinical Summary [name] is a 68 y.o.male seen today for follow up for the following medical problems.  1. Diastolic heart failure  - prior Jan 2015 admission with respiratory failure and hypoxia with related  PEA arrest in the setting of volume overload and COPD  - recent admit 08/2013 with SOB and pulm edema, diuresed during that admit, discharge wt 148 lbs, Cr 1.08, BUN 33, K 4.7    - overall stable breathing since last visit, DOE 1-2 blocks which stable. Chronic 2 pillow orthopnea, no LE edema  - reports his wt at home is approx 151 lbs and stable  2. CAD  - history of prior CABG 2003  - cath 01/2013 in setting of NSTEMI: LM patent, LAD prox 50% and 50% mid, D1 50%, LCX 30% mid, RCA 50% mid with more distal 99% lesion. SVG to diag occluded, SVG to PDA occluded, SVG to OM occluded, LIMA to LAD patent. The RCA received a DES  - compliant with plavix, has not been on ASA b/c also on coumadin to lower bleeding risk  - no recent chest pain   3. Valvular heart disease  - St Jude MVR and AVR, on chronic coumadin  - echo 07/2013 with normal function of both prosthetic valves per report  - compliant with coumadin   4. Afib  - recent DCCV  - denies any recent palpitations.  - on coumadin, denies any bleeding on coumadin  5. Hyperlipidemia  - last panel in our system 01/2013: TC 147 TG 47 HDL 64 LDL 74  - compliant with statin   Past Medical History  Diagnosis Date  . Essential hypertension, benign   . Coronary atherosclerosis of native coronary artery     a. CABG/MVR/AVR-2003 (#25 St. Jude/#21 St. Jude); anticoagulation; b. negative stress nuclear-2005;  c. 01/2013 NSTEMI/Cath/PCI: LM nl, LAD 50p, 40-56m, D1 50ost, LCX 28m, RCA 50/51m (2.5x20 Promus DES), 50d, VG->Diag 100, VG->PDA 100, VG->OM 100, LIMA->LAD ok.  . Epistaxis     requiring cautery & aterial ligation-09/2009  . GERD (gastroesophageal reflux disease)   . Hyperlipemia   . Abnormal LFTs     Possible cirrhosis  .  Chronic anticoagulation   . Valvular heart disease     a. 2003: MVR/AVR-2003 (#25 St. Jude/#21 St. Jude);  b. 01/2013 Echo: EF 55%, Mech AVR mean grad 12, Mech MVR mean grad 6.  . Tobacco abuse     45 pack years  . Fasting hyperglycemia   . Nephrolithiasis   . Diverticulosis   . Hyponatremia   . Chronic diastolic CHF (congestive heart failure)   . Chronic renal disease   . Hemolytic anemia   . ETOH abuse   . A-fib 01/16/2013    a. On coumadin DCCV 07/2013.   Marland Kitchen COPD (chronic obstructive pulmonary disease)   . Hx of echocardiogram     a. Echo (07/2013):  EF 50-55%, normal MVR/AVR, mod LAE.     No Known Allergies   Current Outpatient Prescriptions  Medication Sig Dispense Refill  . acetaminophen (TYLENOL) 325 MG tablet Take 2 tablets (650 mg total) by mouth every 6 (six) hours as needed. For pain  30 tablet  0  . amLODipine (NORVASC) 10 MG tablet Take 10 mg by mouth at bedtime.      Marland Kitchen atorvastatin (LIPITOR) 40 MG tablet TAKE ONE TABLET BY MOUTH EVERY DAY. **THIS REPLACES ZOCOR (SIMVASTATIN)**  30 tablet  3  . clopidogrel (PLAVIX) 75 MG tablet Take 1 tablet (  75 mg total) by mouth daily with breakfast.  30 tablet  6  . ferrous sulfate 325 (65 FE) MG EC tablet Take 325 mg by mouth daily with breakfast.      . folic acid (FOLVITE) 1 MG tablet Take 1 tablet (1 mg total) by mouth daily.  30 tablet  4  . HYDROcodone-acetaminophen (NORCO) 10-325 MG per tablet Take 1 tablet by mouth every 6 (six) hours as needed (pain).      Marland Kitchen levalbuterol (XOPENEX) 0.63 MG/3ML nebulizer solution Take 3 mLs (0.63 mg total) by nebulization every 6 (six) hours as needed for wheezing or shortness of breath.  3 mL  4  . magnesium oxide (MAG-OX) 400 (241.3 MG) MG tablet Take 1 tablet (400 mg total) by mouth daily.  30 tablet  6  . metoprolol (LOPRESSOR) 100 MG tablet Take 1 tablet (100 mg total) by mouth 2 (two) times daily.  60 tablet  6  . Multiple Vitamin (MULTIVITAMIN WITH MINERALS) TABS Take 1 tablet by mouth  daily.      . nitroGLYCERIN (NITROSTAT) 0.4 MG SL tablet Place 1 tablet (0.4 mg total) under the tongue every 5 (five) minutes x 3 doses as needed for chest pain.  25 tablet  3  . pantoprazole (PROTONIX) 40 MG tablet Take 1 tablet (40 mg total) by mouth daily.  30 tablet  0  . potassium chloride SA (K-DUR,KLOR-CON) 20 MEQ tablet Take 20 mEq by mouth daily.      Marland Kitchen thiamine 100 MG tablet Take 1 tablet (100 mg total) by mouth daily.  30 tablet  4  . torsemide (DEMADEX) 20 MG tablet Take 2 tablets (40 mg total) by mouth 2 (two) times daily.  60 tablet  6  . warfarin (COUMADIN) 5 MG tablet Take 1 1/2 tablets (7.5 mg) on Mondays and  Fridays  take 1 tablet (5 mg) on Sunday, Tuesday, Wednesday Thursday and Saturday       No current facility-administered medications for this visit.     Past Surgical History  Procedure Laterality Date  . Endocopic sphenopalatine artery ligation & cautry    . Inguinal hernia repair      Left & right  . Coronary artery bypass graft  11/2001    [facility]  . Cardiac valve replacement  11/2001    AVR and MVR-St. Jude devices  . Cataract extraction w/phaco  01/20/2011    Procedure: CATARACT EXTRACTION PHACO AND INTRAOCULAR LENS PLACEMENT (IOC);  Surgeon: Elta Guadeloupe T. Gershon Crane;  Location: [facility];  Service: Ophthalmology;  Laterality: Right;  CDE: 8.51  . Cataract extraction w/phaco  02/03/2011    Procedure: CATARACT EXTRACTION PHACO AND INTRAOCULAR LENS PLACEMENT (IOC);  Surgeon: Elta Guadeloupe T. Gershon Crane;  Location: [facility];  Service: Ophthalmology;  Laterality: Left;  CDE:10.01  . Colonoscopy  04/05/02; 08/2011    friable anal canal hemorrhoids otherwise normal; 2 diminutive polyps excised, minimal diverticulosis noted  . Esophagogastroduodenoscopy  01/2002    Dr. Laural Golden, submucosal esophageal lesion c/w leiomyoma  . Colonoscopy  09/02/2011    Procedure: COLONOSCOPY;  Surgeon: Daneil Dolin, MD;  Location: [facility];  Service: Endoscopy;  Laterality: N/A;  8:15  . Yag laser  application Right 71/69/6789    Procedure: YAG LASER APPLICATION;  Surgeon: Elta Guadeloupe T. Gershon Crane, MD;  Location: [facility];  Service: Ophthalmology;  Laterality: Right;     No Known Allergies    Family History  Problem Relation Age of Onset  . Hypotension Neg Hx   .  Anesthesia problems Neg Hx   . Malignant hyperthermia Neg Hx   . Pseudochol deficiency Neg Hx   . Colon cancer Neg Hx   . Liver disease Neg Hx      Social History Mr. Meras reports that he has been smoking Cigarettes.  He has a 13 pack-year smoking history. He has never used smokeless tobacco. Mr. Hobbs reports that he drinks about 4.2 ounces of alcohol per week.   Review of Systems CONSTITUTIONAL: No weight loss, fever, chills, weakness or fatigue.  HEENT: Eyes: No visual loss, blurred vision, double vision or yellow sclerae.No hearing loss, sneezing, congestion, runny nose or sore throat.  SKIN: No rash or itching.  CARDIOVASCULAR: per HPI RESPIRATORY: No cough or sputum.  GASTROINTESTINAL: No anorexia, nausea, vomiting or diarrhea. No abdominal pain or blood.  GENITOURINARY: No burning on urination, no polyuria NEUROLOGICAL: No headache, dizziness, syncope, paralysis, ataxia, numbness or tingling in the extremities. No change in bowel or bladder control.  MUSCULOSKELETAL: No muscle, back pain, joint pain or stiffness.  LYMPHATICS: No enlarged nodes. No history of splenectomy.  PSYCHIATRIC: No history of depression or anxiety.  ENDOCRINOLOGIC: No reports of sweating, cold or heat intolerance. No polyuria or polydipsia.  Marland Kitchen   Physical Examination p 65 bp 148/52 Wt 160 lbs BMI 26 Gen: resting comfortably, no acute distress HEENT: no scleral icterus, pupils equal round and reactive, no palptable cervical adenopathy,  CV: RRR, mechanical S1/S2, no JVD, no carotid bruits Resp: Clear to auscultation bilaterally GI: abdomen is soft, non-tender, non-distended, normal bowel sounds, no hepatosplenomegaly MSK: extremities  are warm, 1+ bilateral edema Skin: warm, no rash Neuro:  no focal deficits Psych: appropriate affect   Diagnostic Studies 07/2013 Echo  LVEF 50-55%, normal AVR, normal MVR,  01/2013 Cath  Hemodynamics:  AO: 145/59 mmHg  Coronary angiography:  Coronary dominance:  Left Main: Short in with no significant disease.  Left Anterior Descending (LAD): Normal in size with moderate calcifications proximally. There is 50% disease proximally just before the diagonal Cleve Paolillo. The vessel is very tortuous in the midsegment with diffuse 40-50% disease. There is competitive flow noted distally from a patent LIMA.  1st diagonal (D1): Large in size with 50% ostial and proximal disease. This vessel is large and supplies most of the anterolateral wall.  2nd diagonal (D2): Very small in size.  3rd diagonal (D3): Very small in size.  Circumflex (LCx): Normal in size and nondominant. The vessel is mildly calcified. There is 30 % tubular disease in the midsegment.  1st obtuse marginal: Very small in size with minor irregularities.  2nd obtuse marginal: Small in size with 60% ostial disease.  3rd obtuse marginal: Normal in size with 80% proximal disease. The vessel is very tortuous in that segment and distally.  AV groove continuation segment: Normal in size with minor irregularities.  Right Coronary Artery: Normal in size and dominant. The vessel is moderately calcified throughout its course. There is diffuse 50% disease in the midsegment. There is another lesion of 99% in the midsegment just before giving a large RV Arzella Rehmann. There is 50% disease distally.  Posterior descending artery: Normal in size with minor irregularities.  Posterior AV segment: Small in size. SVG to diagonal: Occluded proximally. I suspect that this is the culprit for non-ST elevation MI due to dye stain noted.  SVG to right PDA: Occluded proximally.  SVG to OM: Is occluded proximally.  LIMA to LAD: Patent with no significant disease.  Left  ventriculography: Was not performed.  PCI Procedure Note: Following the diagnostic procedure, the decision was made to proceed with PCI. I reviewed the images with Dr. Burt Knack. The sheath was upsized to a 6 [place]. Weight-based bivalirudin was given for anticoagulation. Once a therapeutic ACT was achieved, a 6 [place] JR 4 guide catheter was inserted. A run through coronary guidewire was used to cross the lesion. The lesion was predilated with a 2.5 x 12 mm balloon. After crossing the lesion, there was no flow in the distal RCA likely due to severity of stenosis. There was sluggish flow or send RV Boots Mcglown. Thus, I used another run through a wire and placed in the RV Jalisha Enneking for protection. I then tried to advance a 2.5 x 20 mm Promus drug-eluting stent into the distal RCA but was not successful due to tortuosity and calcifications. This was in spite of having to I asked. I decided to predilate the lesion with a 2.5 noncompliant balloon which was done with multiple inflations. I was then able to advance the stent after deep seating the guide. The stent was deployed at 12 atmospheres. I then removed the wire from the RV Clarita Mcelvain and post dilated the stent with a 2.5 noncompliant balloon to 16 atmospheres. Following PCI, there was 0% residual stenosis and TIMI-3 flow. Final angiography confirmed an excellent result. There was still moderate disease in the mid RCA proximal to the stent which improved after removing the wires likely due to wire bias. This was left to be treated medically. Femoral hemostasis was achieved with a Mynx device. The patient tolerated the PCI procedure well. There were no immediate procedural complications. The patient was transferred to the post catheterization recovery area for further monitoring.  PCI Data:  Vessel - mid RCA/Segment - 2  Percent Stenosis (pre) 99%  TIMI-flow 3  Stent 2.5 x 20 mm Promus drug-eluting stent  Percent Stenosis (post) 0%  TIMI-flow (post) 3  Final Conclusions:    1. Significant three-vessel coronary artery disease with occluded vein grafts. Patent LIMA to LAD. The culprit for non-ST elevation myocardial infarction is likely occluded SVG to diagonal. However, the native diagonal is large and does not seem to have obstructive disease. There is significant disease in proximal OM 3. However, the vessel is tortuous and calcified. Severe 99% stenosis in mid RCA at the bifurcation with a large RV Haylen Bellotti.  2. Successful angioplasty and drug-eluting stent placement to the mid RCA.  Recommendations:  I recommend treatment with Plavix and warfarin alone without aspirin to minimize risk of bleeding. I will resume heparin 8 hours from now and resume warfarin as well. Obtain an echocardiogram to evaluate LV systolic function and the function of mechanical valves. Recommend rate control for atrial fibrillation. I increased the dose of metoprolol.  Recommend medical therapy for that disease in OM3 which is tortuous and calcified   08/17/13  Nuclear stress test  Tomographic views were obtained using the short axis, vertical long axis, and horizontal long axis planes. There is a moderate-sized, mild to moderate intensity, reversible defect in the lateral wall that is consistent with ischemia. There is also a small, mild intensity, partially reversible inferior wall defect that is consistent with scar and associated mild peri-infarct ischemia.  Gated imaging reveals an EDV of 147, ESV of 90, TID ratio 0.99, and LVEF calculated at 39% with inferoposterior hypokinesis.  IMPRESSION: Intermediate risk Lexiscan Cardiolite. Nondiagnostic ST segment changes were noted, rare PVCs without sustained arrhythmia. Sinus rhythm was present throughout. Perfusion imaging is consistent with ischemia  within the lateral wall as outlined, combination of scar and mild peri-infarct ischemia in the inferior wall. LVEF 39% with inferoposterior hypokinesis and overall normal LV volume. This  is consistent with ischemic heart disease and associated cardiomyopathy.         Assessment and Plan  1. Chronic diastolic heart failure  - multiple admissions over last few months with volume overload  - if continues to have recurrent episodes on aggressive diuretic therapy, will need to consider ischemia as a possible exacerbating factor.  - continue current diuretics for now  2. CAD  - no recent chest pain  - recent MPI intermediate risk some areas of ischemia in the lateral wall and inferior wall.  - continue medical management  - continue DAPT at least until 01/2014 for recent DES   3. Valvular heart disease  - prior MVR and AVR  - recent echo with normal functioning prosthetic valves  - continue coumadin.   4. Afib  - no current symptoms  - continue rate control and anticoagulation   5. Hyperlipidemia  - at goal, continue current statin.    F/u 3 months    Arnoldo Lenis, M.D., F.A.C.C.

## 2013-10-23 ENCOUNTER — Encounter (HOSPITAL_COMMUNITY): Payer: Self-pay | Admitting: Emergency Medicine

## 2013-10-23 ENCOUNTER — Emergency Department (HOSPITAL_COMMUNITY): Payer: Medicare Other

## 2013-10-23 ENCOUNTER — Emergency Department (HOSPITAL_COMMUNITY)
Admission: EM | Admit: 2013-10-23 | Discharge: 2013-10-23 | Disposition: A | Discharge status: Home / Self Care | Payer: Medicare Other | Attending: Emergency Medicine | Admitting: Emergency Medicine

## 2013-10-23 DIAGNOSIS — Z7902 Long term (current) use of antithrombotics/antiplatelets: Secondary | ICD-10-CM | POA: Insufficient documentation

## 2013-10-23 DIAGNOSIS — J441 Chronic obstructive pulmonary disease with (acute) exacerbation: Secondary | ICD-10-CM | POA: Insufficient documentation

## 2013-10-23 DIAGNOSIS — I5032 Chronic diastolic (congestive) heart failure: Secondary | ICD-10-CM | POA: Insufficient documentation

## 2013-10-23 DIAGNOSIS — K219 Gastro-esophageal reflux disease without esophagitis: Secondary | ICD-10-CM | POA: Insufficient documentation

## 2013-10-23 DIAGNOSIS — Z951 Presence of aortocoronary bypass graft: Secondary | ICD-10-CM | POA: Insufficient documentation

## 2013-10-23 DIAGNOSIS — I4891 Unspecified atrial fibrillation: Secondary | ICD-10-CM | POA: Insufficient documentation

## 2013-10-23 DIAGNOSIS — F17200 Nicotine dependence, unspecified, uncomplicated: Secondary | ICD-10-CM | POA: Insufficient documentation

## 2013-10-23 DIAGNOSIS — Z954 Presence of other heart-valve replacement: Secondary | ICD-10-CM | POA: Insufficient documentation

## 2013-10-23 DIAGNOSIS — Z7901 Long term (current) use of anticoagulants: Secondary | ICD-10-CM | POA: Insufficient documentation

## 2013-10-23 DIAGNOSIS — I129 Hypertensive chronic kidney disease with stage 1 through stage 4 chronic kidney disease, or unspecified chronic kidney disease: Secondary | ICD-10-CM | POA: Insufficient documentation

## 2013-10-23 DIAGNOSIS — I2510 Atherosclerotic heart disease of native coronary artery without angina pectoris: Secondary | ICD-10-CM | POA: Insufficient documentation

## 2013-10-23 DIAGNOSIS — D599 Acquired hemolytic anemia, unspecified: Secondary | ICD-10-CM | POA: Insufficient documentation

## 2013-10-23 DIAGNOSIS — Z79899 Other long term (current) drug therapy: Secondary | ICD-10-CM | POA: Insufficient documentation

## 2013-10-23 DIAGNOSIS — R040 Epistaxis: Secondary | ICD-10-CM | POA: Insufficient documentation

## 2013-10-23 DIAGNOSIS — E785 Hyperlipidemia, unspecified: Secondary | ICD-10-CM | POA: Insufficient documentation

## 2013-10-23 DIAGNOSIS — Z87442 Personal history of urinary calculi: Secondary | ICD-10-CM | POA: Insufficient documentation

## 2013-10-23 DIAGNOSIS — I252 Old myocardial infarction: Secondary | ICD-10-CM | POA: Insufficient documentation

## 2013-10-23 DIAGNOSIS — N189 Chronic kidney disease, unspecified: Secondary | ICD-10-CM | POA: Insufficient documentation

## 2013-10-23 LAB — COMPREHENSIVE METABOLIC PANEL
ALT: 10 U/L (ref 0–53)
AST: 23 U/L (ref 0–37)
Albumin: 3.3 g/dL — ABNORMAL LOW (ref 3.5–5.2)
Alkaline Phosphatase: 120 U/L — ABNORMAL HIGH (ref 39–117)
BUN: 40 mg/dL — ABNORMAL HIGH (ref 6–23)
CALCIUM: 9.1 mg/dL (ref 8.4–10.5)
CO2: 30 mEq/L (ref 19–32)
CREATININE: 1.28 mg/dL (ref 0.50–1.35)
Chloride: 96 mEq/L (ref 96–112)
GFR calc Af Amer: 65 mL/min — ABNORMAL LOW (ref >90)
GFR, EST NON AFRICAN AMERICAN: 56 mL/min — AB (ref >90)
Glucose, Bld: 126 mg/dL — ABNORMAL HIGH (ref 70–99)
Potassium: 4.4 mEq/L (ref 3.7–5.3)
Sodium: 136 mEq/L — ABNORMAL LOW (ref 137–147)
TOTAL PROTEIN: 7.4 g/dL (ref 6.0–8.3)
Total Bilirubin: 0.7 mg/dL (ref 0.3–1.2)

## 2013-10-23 LAB — PROTIME-INR
INR: 2.91 — AB (ref 0.00–1.49)
INR: 1.3 (ref 0.00–1.49)
PROTHROMBIN TIME: 15.9 s — AB (ref 11.6–15.2)
Prothrombin Time: 29.4 seconds — ABNORMAL HIGH (ref 11.6–15.2)

## 2013-10-23 LAB — CBC WITH DIFFERENTIAL/PLATELET
BASOS ABS: 0.1 10*3/uL (ref 0.0–0.1)
Basophils Relative: 1 % (ref 0–1)
EOS ABS: 0.3 10*3/uL (ref 0.0–0.7)
EOS PCT: 5 % (ref 0–5)
HCT: 32.4 % — ABNORMAL LOW (ref 39.0–52.0)
Hemoglobin: 10.4 g/dL — ABNORMAL LOW (ref 13.0–17.0)
Lymphocytes Relative: 3 % — ABNORMAL LOW (ref 12–46)
Lymphs Abs: 0.2 10*3/uL — ABNORMAL LOW (ref 0.7–4.0)
MCH: 31.6 pg (ref 26.0–34.0)
MCHC: 32.1 g/dL (ref 30.0–36.0)
MCV: 98.5 fL (ref 78.0–100.0)
MONO ABS: 0.8 10*3/uL (ref 0.1–1.0)
Monocytes Relative: 12 % (ref 3–12)
Neutro Abs: 5.3 10*3/uL (ref 1.7–7.7)
Neutrophils Relative %: 79 % — ABNORMAL HIGH (ref 43–77)
PLATELETS: 287 10*3/uL (ref 150–400)
RBC: 3.29 MIL/uL — ABNORMAL LOW (ref 4.22–5.81)
RDW: 14.4 % (ref 11.5–15.5)
WBC: 6.7 10*3/uL (ref 4.0–10.5)

## 2013-10-23 LAB — TYPE AND SCREEN
ABO/RH(D): O POS
ANTIBODY SCREEN: NEGATIVE

## 2013-10-23 LAB — APTT: APTT: 46 s — AB (ref 24–37)

## 2013-10-23 MED ORDER — VITAMIN K1 10 MG/ML IJ SOLN
10.0000 mg | INTRAVENOUS | Status: AC
  Administered 2013-10-23: 10 mg via INTRAVENOUS
  Filled 2013-10-23: qty 1

## 2013-10-23 MED ORDER — PROTHROMBIN COMPLEX CONC HUMAN 500 UNITS IV KIT
1639.0000 [IU] | PACK | Status: AC
  Administered 2013-10-23: 1639 [IU] via INTRAVENOUS
  Filled 2013-10-23: qty 66

## 2013-10-23 MED ORDER — NICARDIPINE HCL IN NACL 20-0.86 MG/200ML-% IV SOLN
3.0000 mg/h | Freq: Once | INTRAVENOUS | Status: AC
  Administered 2013-10-23: 3 mg/h via INTRAVENOUS
  Filled 2013-10-23: qty 200

## 2013-10-23 MED ORDER — PROTHROMBIN COMPLEX CONC HUMAN 500 UNITS IV KIT
25.0000 [IU]/kg | PACK | Status: DC
  Filled 2013-10-23: qty 73

## 2013-10-23 MED ORDER — OXYMETAZOLINE HCL 0.05 % NA SOLN
1.0000 | Freq: Once | NASAL | Status: AC
  Administered 2013-10-23: 1 via NASAL
  Filled 2013-10-23: qty 15

## 2013-10-23 NOTE — ED Notes (Signed)
Bleeding from nare almost stopped at this time. Bleeding from right eye stopped. Pt alert and oriented and reports pain at a 0 at this time. Rhinorocket in place and effective at this time.

## 2013-10-23 NOTE — ED Notes (Signed)
Pt right eye draining blood as well as right nasal passage. MD made aware.

## 2013-10-23 NOTE — Discharge Instructions (Signed)
Follow up with dr. Luan Pulling in 2 weeks.   Follow up at the coumadin clinic 840am this Thursday.  Follow up with your ent md Thursday or Friday.  Start back your coumadin today at your normal dose

## 2013-10-23 NOTE — ED Notes (Addendum)
Right nare Rhinorocket applied by physician and oxygen 2l via n/c applied at this time. Pt tolerated procedure well and denies any pain at this time.right is barely bleeding at this time.

## 2013-10-23 NOTE — ED Provider Notes (Signed)
CSN: [id number]     Arrival date & time 10/23/13  0950 History  This chart was scribed for Maudry Diego, MD by Marcha Dutton, ED Scribe. This patient was seen in room APA18/AP[facility]18 and the patient's care was started at 10:09 AM.    Chief Complaint  Patient presents with  . Epistaxis      Patient is a 67 y.o. male presenting with nosebleeds. The history is provided by the patient. No language interpreter was used.  Epistaxis Location:  L nare Severity:  Moderate Duration:  30 minutes Timing:  Constant Progression:  Unchanged Chronicity:  Chronic Context: anticoagulants   Relieved by:  Applying pressure Worsened by:  Movement Ineffective treatments:  Applying pressure Associated symptoms: blood in oropharynx   Associated symptoms: no congestion, no cough and no headaches   Risk factors: frequent nosebleeds   Pt is on coumadin and plavix. Pt has h/o several MIs. Pt sees Dr. Wilburn Cornelia, an ENT specialist, for his chronic nosebleeds.  Past Medical History  Diagnosis Date  . Essential hypertension, benign   . Coronary atherosclerosis of native coronary artery     a. CABG/MVR/AVR-2003 (#25 St. Jude/#21 St. Jude); anticoagulation; b. negative stress nuclear-2005;  c. 01/2013 NSTEMI/Cath/PCI: LM nl, LAD 50p, 40-40m, D1 50ost, LCX 6m, RCA 50/14m (2.5x20 Promus DES), 50d, VG->Diag 100, VG->PDA 100, VG->OM 100, LIMA->LAD ok.  . Epistaxis     requiring cautery & aterial ligation-09/2009  . GERD (gastroesophageal reflux disease)   . Hyperlipemia   . Abnormal LFTs     Possible cirrhosis  . Chronic anticoagulation   . Valvular heart disease     a. 2003: MVR/AVR-2003 (#25 St. Jude/#21 St. Jude);  b. 01/2013 Echo: EF 55%, Mech AVR mean grad 12, Mech MVR mean grad 6.  . Tobacco abuse     45 pack years  . Fasting hyperglycemia   . Nephrolithiasis   . Diverticulosis   . Hyponatremia   . Chronic diastolic CHF (congestive heart failure)   . Chronic renal disease   . Hemolytic anemia   .  ETOH abuse   . A-fib 01/16/2013    a. On coumadin DCCV 07/2013.   Marland Kitchen COPD (chronic obstructive pulmonary disease)   . Hx of echocardiogram     a. Echo (07/2013):  EF 50-55%, normal MVR/AVR, mod LAE.   Past Surgical History  Procedure Laterality Date  . Endocopic sphenopalatine artery ligation & cautry    . Inguinal hernia repair      Left & right  . Coronary artery bypass graft  11/2001    [facility]  . Cardiac valve replacement  11/2001    AVR and MVR-St. Jude devices  . Cataract extraction w/phaco  01/20/2011    Procedure: CATARACT EXTRACTION PHACO AND INTRAOCULAR LENS PLACEMENT (IOC);  Surgeon: Elta Guadeloupe T. Gershon Crane;  Location: [facility];  Service: Ophthalmology;  Laterality: Right;  CDE: 8.51  . Cataract extraction w/phaco  02/03/2011    Procedure: CATARACT EXTRACTION PHACO AND INTRAOCULAR LENS PLACEMENT (IOC);  Surgeon: Elta Guadeloupe T. Gershon Crane;  Location: [facility];  Service: Ophthalmology;  Laterality: Left;  CDE:10.01  . Colonoscopy  04/05/02; 08/2011    friable anal canal hemorrhoids otherwise normal; 2 diminutive polyps excised, minimal diverticulosis noted  . Esophagogastroduodenoscopy  01/2002    Dr. Laural Golden, submucosal esophageal lesion c/w leiomyoma  . Colonoscopy  09/02/2011    Procedure: COLONOSCOPY;  Surgeon: Daneil Dolin, MD;  Location: [facility];  Service: Endoscopy;  Laterality: N/A;  8:15  .  Yag laser application Right 44/81/8563    Procedure: YAG LASER APPLICATION;  Surgeon: Elta Guadeloupe T. Gershon Crane, MD;  Location: [facility];  Service: Ophthalmology;  Laterality: Right;   Family History  Problem Relation Age of Onset  . Hypotension Neg Hx   . Anesthesia problems Neg Hx   . Malignant hyperthermia Neg Hx   . Pseudochol deficiency Neg Hx   . Colon cancer Neg Hx   . Liver disease Neg Hx    History  Substance Use Topics  . Smoking status: Current Every Day Smoker -- 0.25 packs/day for 52 years    Types: Cigarettes  . Smokeless tobacco: Never Used     Comment: STATES HE IS CUTTING DOWN  now only 1/2 ppd (01/27/2013)  . Alcohol Use: 4.2 oz/week    6 Cans of beer, 1 Shots of liquor per week     Comment: Daily    Review of Systems  Constitutional: Negative for appetite change and fatigue.  HENT: Positive for nosebleeds. Negative for congestion, ear discharge and sinus pressure.   Eyes: Negative for discharge.  Respiratory: Negative for cough.   Cardiovascular: Negative for chest pain.  Gastrointestinal: Negative for abdominal pain and diarrhea.  Genitourinary: Negative for frequency and hematuria.  Musculoskeletal: Negative for back pain.  Skin: Negative for rash.  Neurological: Negative for seizures and headaches.  Psychiatric/Behavioral: Negative for hallucinations.      Allergies  Review of patient's allergies indicates no known allergies.  Home Medications   Prior to Admission medications   Medication Sig Start Date End Date Taking? Authorizing Provider  acetaminophen (TYLENOL) 325 MG tablet Take 2 tablets (650 mg total) by mouth every 6 (six) hours as needed. For pain 01/22/13   Brooke O Edmisten, PA-C  amLODipine (NORVASC) 10 MG tablet Take 10 mg by mouth at bedtime.    Historical Provider, MD  atorvastatin (LIPITOR) 40 MG tablet TAKE ONE TABLET BY MOUTH EVERY DAY. **THIS REPLACES ZOCOR (SIMVASTATIN)** 07/31/13   Satira Sark, MD  clopidogrel (PLAVIX) 75 MG tablet Take 1 tablet (75 mg total) by mouth daily with breakfast. 07/03/13   Satira Sark, MD  ferrous sulfate 325 (65 FE) MG EC tablet Take 325 mg by mouth daily with breakfast.    Historical Provider, MD  folic acid (FOLVITE) 1 MG tablet Take 1 tablet (1 mg total) by mouth daily. 01/22/13   Brooke O Edmisten, PA-C  HYDROcodone-acetaminophen (NORCO) 10-325 MG per tablet Take 1 tablet by mouth every 6 (six) hours as needed (pain).    Historical Provider, MD  levalbuterol Penne Lash) 0.63 MG/3ML nebulizer solution Take 3 mLs (0.63 mg total) by nebulization every 6 (six) hours as needed for wheezing or  shortness of breath. 01/22/13   Brooke O Edmisten, PA-C  magnesium oxide (MAG-OX) 400 (241.3 MG) MG tablet Take 1 tablet (400 mg total) by mouth daily. 09/12/13   Lendon Colonel, NP  metoprolol (LOPRESSOR) 100 MG tablet Take 1 tablet (100 mg total) by mouth 2 (two) times daily. 07/03/13   Satira Sark, MD  Multiple Vitamin (MULTIVITAMIN WITH MINERALS) TABS Take 1 tablet by mouth daily. 01/22/13   Brooke O Edmisten, PA-C  nitroGLYCERIN (NITROSTAT) 0.4 MG SL tablet Place 1 tablet (0.4 mg total) under the tongue every 5 (five) minutes x 3 doses as needed for chest pain. 01/31/13   Rogelia Mire, NP  pantoprazole (PROTONIX) 40 MG tablet Take 1 tablet (40 mg total) by mouth daily. 10/09/13   Arnoldo Lenis, MD  potassium chloride SA (K-DUR,KLOR-CON) 20 MEQ tablet Take 20 mEq by mouth daily.    Historical Provider, MD  thiamine 100 MG tablet Take 1 tablet (100 mg total) by mouth daily. 01/22/13   Brooke O Edmisten, PA-C  torsemide (DEMADEX) 20 MG tablet Take 2 tablets (40 mg total) by mouth 2 (two) times daily. 09/12/13   Lendon Colonel, NP  warfarin (COUMADIN) 5 MG tablet Take 1 1/2 tablets (7.5 mg) on Mondays and  Fridays  take 1 tablet (5 mg) on Sunday, Tuesday, Wednesday Thursday and Saturday 07/31/13   Lendon Colonel, NP   Triage Vitals: BP 137/51  Pulse 74  Temp(Src) 97.7 F (36.5 C) (Axillary)  Resp 20  SpO2 88%  Physical Exam  Nursing note and vitals reviewed. Constitutional: He is oriented to person, place, and time. He appears well-developed.  HENT:  Head: Normocephalic.  Nose: Epistaxis (Profuse from left nostril) is observed.  Eyes: Conjunctivae and EOM are normal. No scleral icterus.  Neck: Neck supple. No thyromegaly present.  Cardiovascular: Normal rate and regular rhythm.  Exam reveals no gallop and no friction rub.   No murmur heard. Pulmonary/Chest: No stridor. He has wheezes (mild wheezing bilaterally). He has no rales. He exhibits no tenderness.  Abdominal:  He exhibits no distension. There is no tenderness. There is no rebound.  Musculoskeletal: Normal range of motion. He exhibits no edema.  Lymphadenopathy:    He has no cervical adenopathy.  Neurological: He is oriented to person, place, and time. He exhibits normal muscle tone. Coordination normal.  Skin: No rash noted. No erythema.  Psychiatric: He has a normal mood and affect. His behavior is normal.    ED Course  EPISTAXIS MANAGEMENT Date/Time: 10/23/2013 2:17 PM Performed by: Iver Miklas L Authorized by: Milton Ferguson L Comments: Pt had a nose packing placed in his right nostril   (including critical care time)  DIAGNOSTIC STUDIES: Oxygen Saturation is 88% on RA, low by my interpretation.    COORDINATION OF CARE: 10:12 AM- Pt advised of plan for treatment and pt agrees.    Labs Review Labs Reviewed  CBC WITH DIFFERENTIAL  COMPREHENSIVE METABOLIC PANEL  PROTIME-INR  APTT  TYPE AND SCREEN    Imaging Review Dg Chest Portable 1 View  10/23/2013   CLINICAL DATA:  Epistaxis.  COPD.  Hypertension.  EXAM: PORTABLE CHEST - 1 VIEW  COMPARISON:  08/14/2013  FINDINGS: Previous median sternotomy, mitral valve replacement and aortic valve replacement. Chronic cardiomegaly. Pulmonary venous hypertension with mild interstitial edema, less than was seen previously. No significant effusion. No focal pulmonary abnormality.  IMPRESSION: Previous AVR and MVR. Cardiomegaly. Venous hypertension with mild interstitial edema, less than was seen in February.   Electronically Signed   By: Nelson Chimes M.D.   On: 10/23/2013 10:40     EKG Interpretation   Date/Time:  Monday October 23 2013 10:23:14 EDT Ventricular Rate:  73 PR Interval:  202 QRS Duration: 96 QT Interval:  432 QTC Calculation: 475 R Axis:   59 Text Interpretation:  Normal sinus rhythm Possible Inferior infarct , age  undetermined ST \\T \ T wave abnormality, consider lateral ischemia Abnormal  ECG When compared with ECG of  14-Aug-2013 16:46, Left anterior fascicular  block is no longer Present Borderline criteria for Inferior infarct are  now Present T wave inversion now evident in Inferior leads T wave  inversion more evident in Lateral leads Confirmed by Lamica Mccart  MD, Jannel Lynne  ([id number]) on 10/23/2013 10:29:28 AM  CRITICAL CARE Performed by: Maudry Diego Total critical care time:45 Critical care time was exclusive of separately billable procedures and treating other patients. Critical care was necessary to treat or prevent imminent or life-threatening deterioration. Critical care was time spent personally by me on the following activities: development of treatment plan with patient and/or surrogate as well as nursing, discussions with consultants, evaluation of patient's response to treatment, examination of patient, obtaining history from patient or surrogate, ordering and performing treatments and interventions, ordering and review of laboratory studies, ordering and review of radiographic studies, pulse oximetry and re-evaluation of patient's condition. I spoke with dr. Luan Pulling who will follow up.  Also I spoke with the coumadin clinic and they rec.  Starting back coumadin and follow up thursday MDM   Final diagnoses:  None  nose bleed.  To see ent this week.  The chart was scribed for me under my direct supervision.  I personally performed the history, physical, and medical decision making and all procedures in the evaluation of this patient.Maudry Diego, MD 10/23/13 [phone]

## 2013-10-23 NOTE — ED Notes (Signed)
PT discharged with family and will make follow up apt this week for ent and dr. Luan Pulling office. Pt to go to coumadin clinic this Thursday. Pt alert and denies pain at discharge.

## 2013-10-23 NOTE — ED Notes (Signed)
Pt c/o nose bleed for 30 min. Pt applying pressure with rag at this time.

## 2013-10-24 ENCOUNTER — Telehealth: Payer: Self-pay | Admitting: *Deleted

## 2013-10-24 MED ORDER — ENOXAPARIN SODIUM 80 MG/0.8ML ~~LOC~~ SOLN: 70.0000 mg | Freq: Two times a day (BID) | SUBCUTANEOUS | Status: DC

## 2013-10-24 NOTE — Telephone Encounter (Signed)
Pt was in [facility] ED yesterday (4/20) for nose bleed.  Admission INR was 2.9.  Goal 2.5 - 3.5   Coumadin was reversed with Vit K and INR at D/C was 1.3.  Nose was pack and bleeding stopped.  Pt has F/U with ENT this week.  Discussed case with Dr Harl Bowie.  Will bridge pt with lovenox starting today (4/21). Pt started back on coumadin 7.5mg  daily (on 4/20) until INR check on 4/23.  He will start Lovenox 70mg  BID today.  Wt 150  SrCr 1.28  CrCl 52.45  Rx sent to [facility] . Pt told and he verbalized understanding.

## 2013-10-26 ENCOUNTER — Ambulatory Visit (INDEPENDENT_AMBULATORY_CARE_PROVIDER_SITE_OTHER): Payer: Medicare Other | Admitting: *Deleted

## 2013-10-26 DIAGNOSIS — Z9889 Other specified postprocedural states: Secondary | ICD-10-CM

## 2013-10-26 DIAGNOSIS — Z954 Presence of other heart-valve replacement: Secondary | ICD-10-CM

## 2013-10-26 DIAGNOSIS — Z5181 Encounter for therapeutic drug level monitoring: Secondary | ICD-10-CM

## 2013-10-26 LAB — POCT INR: INR: 1.9

## 2013-10-30 ENCOUNTER — Encounter: Payer: Self-pay | Admitting: Adult Health

## 2013-10-30 ENCOUNTER — Ambulatory Visit (INDEPENDENT_AMBULATORY_CARE_PROVIDER_SITE_OTHER): Payer: Medicare Other | Admitting: *Deleted

## 2013-10-30 ENCOUNTER — Ambulatory Visit (INDEPENDENT_AMBULATORY_CARE_PROVIDER_SITE_OTHER): Payer: Medicare Other | Admitting: Adult Health

## 2013-10-30 VITALS — BP 147/57 | HR 79 | Ht 66.0 in | Wt 162.0 lb

## 2013-10-30 DIAGNOSIS — Z9889 Other specified postprocedural states: Secondary | ICD-10-CM

## 2013-10-30 DIAGNOSIS — Z954 Presence of other heart-valve replacement: Secondary | ICD-10-CM

## 2013-10-30 DIAGNOSIS — Z5181 Encounter for therapeutic drug level monitoring: Secondary | ICD-10-CM

## 2013-10-30 DIAGNOSIS — M25469 Effusion, unspecified knee: Secondary | ICD-10-CM

## 2013-10-30 DIAGNOSIS — I10 Essential (primary) hypertension: Secondary | ICD-10-CM

## 2013-10-30 DIAGNOSIS — M25569 Pain in unspecified knee: Secondary | ICD-10-CM

## 2013-10-30 DIAGNOSIS — M25562 Pain in left knee: Secondary | ICD-10-CM

## 2013-10-30 LAB — POCT INR: INR: 3.3

## 2013-10-30 NOTE — Assessment & Plan Note (Signed)
I will have left knee X-rayed to evaluate for fluid accumulation and possible infection. CBC will be completed.

## 2013-10-30 NOTE — Assessment & Plan Note (Signed)
Doubt that his knee swelling is related to decompensated CHF. It is unilateral. I will however have him take an additional dose of torsemide today, 20 mg. CBC will be completed.

## 2013-10-30 NOTE — Assessment & Plan Note (Signed)
BP is stable. He is advised to avoid the salty foods Doubt he will change his this lifestyle but will continue to educate.

## 2013-10-30 NOTE — Progress Notes (Signed)
HPI: [name] is a 68 year old patient of Dr. branch were on for ongoing assessment and management of diastolic CHF, CAD with prior CABG in 2003, most recent cardiac catheterization in July 2004 in the setting of a non-ST elevation MI. The patient has had PCI to the right coronary artery with a drug-eluting stent. Patient has a history of atrial fibrillation with a recentDCCV. The patient is on Coumadin. He also has valvular heart disease with a St. Jude MVR and aVR, as well. The patient is seen as an add-on today in the setting of fluid retention. He was seen in the emergency room 4/ 20/15 wiith complaints of nosebleeds.    He states that his left knee has been swelling more over the last 3-4 days. He denies pain or infection. He is able to bear weight on it. He is medically complaint. He has stopped taking his LMWH and is back on coumadin. He admits to continuing to eat salty foods, especially canned and [employer] candy bars.   No Known Allergies  Current Outpatient Prescriptions  Medication Sig Dispense Refill  . amLODipine (NORVASC) 10 MG tablet Take 10 mg by mouth at bedtime.      Marland Kitchen atorvastatin (LIPITOR) 40 MG tablet TAKE ONE TABLET BY MOUTH EVERY DAY. **THIS REPLACES ZOCOR (SIMVASTATIN)**  30 tablet  3  . clopidogrel (PLAVIX) 75 MG tablet Take 1 tablet (75 mg total) by mouth daily with breakfast.  30 tablet  6  . enoxaparin (LOVENOX) 80 MG/0.8ML injection Inject 0.7 mLs (70 mg total) into the skin every 12 (twelve) hours.  10 Syringe  1  . ferrous sulfate 325 (65 FE) MG EC tablet Take 325 mg by mouth daily with breakfast.      . folic acid (FOLVITE) 1 MG tablet Take 1 tablet (1 mg total) by mouth daily.  30 tablet  4  . levalbuterol (XOPENEX) 0.63 MG/3ML nebulizer solution Take 3 mLs (0.63 mg total) by nebulization every 6 (six) hours as needed for wheezing or shortness of breath.  3 mL  4  . magnesium oxide (MAG-OX) 400 (241.3 MG) MG tablet Take 1 tablet (400 mg total) by mouth daily.  30  tablet  6  . metoprolol (LOPRESSOR) 100 MG tablet Take 1 tablet (100 mg total) by mouth 2 (two) times daily.  60 tablet  6  . Multiple Vitamin (MULTIVITAMIN WITH MINERALS) TABS Take 1 tablet by mouth daily.      . nitroGLYCERIN (NITROSTAT) 0.4 MG SL tablet Place 1 tablet (0.4 mg total) under the tongue every 5 (five) minutes x 3 doses as needed for chest pain.  25 tablet  3  . pantoprazole (PROTONIX) 40 MG tablet Take 1 tablet (40 mg total) by mouth daily.  30 tablet  0  . potassium chloride SA (K-DUR,KLOR-CON) 20 MEQ tablet Take 20 mEq by mouth daily.      Marland Kitchen thiamine 100 MG tablet Take 1 tablet (100 mg total) by mouth daily.  30 tablet  4  . torsemide (DEMADEX) 20 MG tablet Take 2 tablets (40 mg total) by mouth 2 (two) times daily.  60 tablet  6  . warfarin (COUMADIN) 5 MG tablet Take 1 1/2 tablets (7.5 mg) on Mondays and  Fridays  take 1 tablet (5 mg) on Sunday, Tuesday, Wednesday Thursday and Saturday       No current facility-administered medications for this visit.    Past Medical History  Diagnosis Date  . Essential hypertension, benign   .  Coronary atherosclerosis of native coronary artery     a. CABG/MVR/AVR-2003 (#25 St. Jude/#21 St. Jude); anticoagulation; b. negative stress nuclear-2005;  c. 01/2013 NSTEMI/Cath/PCI: LM nl, LAD 50p, 40-90m, D1 50ost, LCX 36m, RCA 50/13m (2.5x20 Promus DES), 50d, VG->Diag 100, VG->PDA 100, VG->OM 100, LIMA->LAD ok.  . Epistaxis     requiring cautery & aterial ligation-09/2009  . GERD (gastroesophageal reflux disease)   . Hyperlipemia   . Abnormal LFTs     Possible cirrhosis  . Chronic anticoagulation   . Valvular heart disease     a. 2003: MVR/AVR-2003 (#25 St. Jude/#21 St. Jude);  b. 01/2013 Echo: EF 55%, Mech AVR mean grad 12, Mech MVR mean grad 6.  . Tobacco abuse     45 pack years  . Fasting hyperglycemia   . Nephrolithiasis   . Diverticulosis   . Hyponatremia   . Chronic diastolic CHF (congestive heart failure)   . Chronic renal disease     . Hemolytic anemia   . ETOH abuse   . A-fib 01/16/2013    a. On coumadin DCCV 07/2013.   Marland Kitchen COPD (chronic obstructive pulmonary disease)   . Hx of echocardiogram     a. Echo (07/2013):  EF 50-55%, normal MVR/AVR, mod LAE.    Past Surgical History  Procedure Laterality Date  . Endocopic sphenopalatine artery ligation & cautry    . Inguinal hernia repair      Left & right  . Coronary artery bypass graft  11/2001    [facility]  . Cardiac valve replacement  11/2001    AVR and MVR-St. Jude devices  . Cataract extraction w/phaco  01/20/2011    Procedure: CATARACT EXTRACTION PHACO AND INTRAOCULAR LENS PLACEMENT (IOC);  Surgeon: Elta Guadeloupe T. Gershon Crane;  Location: [facility];  Service: Ophthalmology;  Laterality: Right;  CDE: 8.51  . Cataract extraction w/phaco  02/03/2011    Procedure: CATARACT EXTRACTION PHACO AND INTRAOCULAR LENS PLACEMENT (IOC);  Surgeon: Elta Guadeloupe T. Gershon Crane;  Location: [facility];  Service: Ophthalmology;  Laterality: Left;  CDE:10.01  . Colonoscopy  04/05/02; 08/2011    friable anal canal hemorrhoids otherwise normal; 2 diminutive polyps excised, minimal diverticulosis noted  . Esophagogastroduodenoscopy  01/2002    Dr. Laural Golden, submucosal esophageal lesion c/w leiomyoma  . Colonoscopy  09/02/2011    Procedure: COLONOSCOPY;  Surgeon: Daneil Dolin, MD;  Location: [facility];  Service: Endoscopy;  Laterality: N/A;  8:15  . Yag laser application Right 52/84/1324    Procedure: YAG LASER APPLICATION;  Surgeon: Elta Guadeloupe T. Gershon Crane, MD;  Location: [facility];  Service: Ophthalmology;  Laterality: Right;    ROS: Review of systems complete and found to be negative unless listed above  PHYSICAL EXAM BP 147/57  Pulse 79  Ht 5\' 6"  (1.676 m)  Wt 162 lb (73.483 kg)  BMI 26.16 kg/m2  SpO2 95% General: Well developed, well nourished, in no acute distress Head: Eyes PERRLA, No xanthomas.   Normal cephalic and atramatic  Lungs: Clear bilaterally to auscultation and percussion.Inspiratory wheezes are  noted.  Heart: HRRR S1 S2, without MRG.  Pulses are 2+ & equal.            No carotid bruit. No JVD.  No abdominal bruits. No femoral bruits. Abdomen: Bowel sounds are positive, abdomen soft and non-tender without masses or                  Hernia's noted. Msk:  Back normal, normal gait. Normal strength and tone for age.  Extremities: 2+ pitting edema around the left knee, 2+ in the ankle with venous statis skin changes.  DP +1 Neuro: Alert and oriented X 3. Psych:  Good affect, responds appropriately  ASSESSMENT AND PLAN

## 2013-10-30 NOTE — Progress Notes (Signed)
Name: [name]    DOB: 21-Apr-1946  Age: 68 y.o.  MR#: [id number]       PCP:  Alonza Bogus, MD      Insurance: Payor: [employer] MEDICARE / Plan: AARP MEDICARE COMPLETE / Product Type: *No Product type* /   CC:    Chief Complaint  Patient presents with  . Congestive Heart Failure  . Coronary Artery Disease   SWELLING VS Filed Vitals:   10/30/13 1310  BP: 147/57  Pulse: 79  Height: 5\' 6"  (1.676 m)  Weight: 162 lb (73.483 kg)  SpO2: 95%    Weights Current Weight  10/30/13 162 lb (73.483 kg)  10/23/13 160 lb (72.576 kg)  10/11/13 160 lb (72.576 kg)    Blood Pressure  BP Readings from Last 3 Encounters:  10/30/13 147/57  10/23/13 159/65  10/11/13 148/52     Admit date:  (Not on file) Last encounter with RMR:  08/03/2013   Allergy Review of patient's allergies indicates no known allergies.  Current Outpatient Prescriptions  Medication Sig Dispense Refill  . amLODipine (NORVASC) 10 MG tablet Take 10 mg by mouth at bedtime.      Marland Kitchen atorvastatin (LIPITOR) 40 MG tablet TAKE ONE TABLET BY MOUTH EVERY DAY. **THIS REPLACES ZOCOR (SIMVASTATIN)**  30 tablet  3  . clopidogrel (PLAVIX) 75 MG tablet Take 1 tablet (75 mg total) by mouth daily with breakfast.  30 tablet  6  . enoxaparin (LOVENOX) 80 MG/0.8ML injection Inject 0.7 mLs (70 mg total) into the skin every 12 (twelve) hours.  10 Syringe  1  . ferrous sulfate 325 (65 FE) MG EC tablet Take 325 mg by mouth daily with breakfast.      . folic acid (FOLVITE) 1 MG tablet Take 1 tablet (1 mg total) by mouth daily.  30 tablet  4  . levalbuterol (XOPENEX) 0.63 MG/3ML nebulizer solution Take 3 mLs (0.63 mg total) by nebulization every 6 (six) hours as needed for wheezing or shortness of breath.  3 mL  4  . magnesium oxide (MAG-OX) 400 (241.3 MG) MG tablet Take 1 tablet (400 mg total) by mouth daily.  30 tablet  6  . metoprolol (LOPRESSOR) 100 MG tablet Take 1 tablet (100 mg total) by mouth 2 (two) times daily.  60 tablet  6   . Multiple Vitamin (MULTIVITAMIN WITH MINERALS) TABS Take 1 tablet by mouth daily.      . nitroGLYCERIN (NITROSTAT) 0.4 MG SL tablet Place 1 tablet (0.4 mg total) under the tongue every 5 (five) minutes x 3 doses as needed for chest pain.  25 tablet  3  . pantoprazole (PROTONIX) 40 MG tablet Take 1 tablet (40 mg total) by mouth daily.  30 tablet  0  . potassium chloride SA (K-DUR,KLOR-CON) 20 MEQ tablet Take 20 mEq by mouth daily.      Marland Kitchen thiamine 100 MG tablet Take 1 tablet (100 mg total) by mouth daily.  30 tablet  4  . torsemide (DEMADEX) 20 MG tablet Take 2 tablets (40 mg total) by mouth 2 (two) times daily.  60 tablet  6  . warfarin (COUMADIN) 5 MG tablet Take 1 1/2 tablets (7.5 mg) on Mondays and  Fridays  take 1 tablet (5 mg) on Sunday, Tuesday, Wednesday Thursday and Saturday       No current facility-administered medications for this visit.    Discontinued Meds:   There are no discontinued medications.  Patient Active Problem List   Diagnosis Date Noted  .  Encounter for therapeutic drug monitoring 08/03/2013  . Atrial fibrillation 07/30/2013  . Acute on chronic diastolic CHF (congestive heart failure), NYHA class 4 01/30/2013  . Acute respiratory failure with hypoxia 01/17/2013  . NSTEMI (non-ST elevated myocardial infarction) 01/16/2013  . Coronary atherosclerosis of unspecified type of vessel, native or graft   . Hypertension   . GERD (gastroesophageal reflux disease)   . Nephrolithiasis   . Chronic anticoagulation 12/22/2010  . HYPONATREMIA, CHRONIC 04/06/2010  . HYPERLIPIDEMIA 02/20/2010  . ANEMIA 02/20/2010  . TOBACCO ABUSE 02/20/2010  . CHRONIC OBSTRUCTIVE PULMONARY DISEASE 02/20/2010  . DIVERTICULOSIS, COLON 02/20/2010  . MITRAL VALVE REPLACEMENT, HX OF 02/20/2010  . AORTIC VALVE REPLACEMENT, HX OF 02/20/2010    LABS    Component Value Date/Time   NA 136* 10/23/2013 1035   NA 138 08/16/2013 0450   NA 136* 08/15/2013 0339   K 4.4 10/23/2013 1035   K 3.8  08/16/2013 0450   K 4.7 08/15/2013 0339   CL 96 10/23/2013 1035   CL 96 08/16/2013 0450   CL 93* 08/15/2013 0339   CO2 30 10/23/2013 1035   CO2 30 08/16/2013 0450   CO2 30 08/15/2013 0339   GLUCOSE 126* 10/23/2013 1035   GLUCOSE 134* 08/16/2013 0450   GLUCOSE 149* 08/15/2013 0339   BUN 40* 10/23/2013 1035   BUN 33* 08/16/2013 0450   BUN 33* 08/15/2013 0339   CREATININE 1.28 10/23/2013 1035   CREATININE 1.16 08/16/2013 0450   CREATININE 1.08 08/15/2013 0339   CREATININE 1.18 08/09/2013 1009   CREATININE 1.39* 07/11/2013 1048   CREATININE 0.84 05/18/2011 0710   CALCIUM 9.1 10/23/2013 1035   CALCIUM 9.0 08/16/2013 0450   CALCIUM 9.4 08/15/2013 0339   GFRNONAA 56* 10/23/2013 1035   GFRNONAA 63* 08/16/2013 0450   GFRNONAA 69* 08/15/2013 0339   GFRAA 65* 10/23/2013 1035   GFRAA 73* 08/16/2013 0450   GFRAA 79* 08/15/2013 0339   CMP     Component Value Date/Time   NA 136* 10/23/2013 1035   K 4.4 10/23/2013 1035   CL 96 10/23/2013 1035   CO2 30 10/23/2013 1035   GLUCOSE 126* 10/23/2013 1035   BUN 40* 10/23/2013 1035   CREATININE 1.28 10/23/2013 1035   CREATININE 1.18 08/09/2013 1009   CALCIUM 9.1 10/23/2013 1035   PROT 7.4 10/23/2013 1035   ALBUMIN 3.3* 10/23/2013 1035   AST 23 10/23/2013 1035   ALT 10 10/23/2013 1035   ALKPHOS 120* 10/23/2013 1035   BILITOT 0.7 10/23/2013 1035   GFRNONAA 56* 10/23/2013 1035   GFRAA 65* 10/23/2013 1035       Component Value Date/Time   WBC 6.7 10/23/2013 1035   WBC 10.5 08/14/2013 1637   WBC 8.4 07/28/2013 0505   HGB 10.4* 10/23/2013 1035   HGB 11.3* 08/14/2013 1637   HGB 10.2* 07/28/2013 0505   HCT 32.4* 10/23/2013 1035   HCT 34.2* 08/14/2013 1637   HCT 31.8* 07/28/2013 0505   MCV 98.5 10/23/2013 1035   MCV 99.1 08/14/2013 1637   MCV 101.6* 07/28/2013 0505    Lipid Panel     Component Value Date/Time   CHOL 147 01/28/2013 0119   TRIG 47 01/28/2013 0119   HDL 64 01/28/2013 0119   CHOLHDL 2.3 01/28/2013 0119   VLDL 9 01/28/2013 0119   LDLCALC 74 01/28/2013 0119   LDLCALC 62 05/17/2008     ABG    Component Value Date/Time   PHART 7.272* 07/23/2013 1450   PCO2ART 57.2* 07/23/2013 1450  PO2ART 70.0* 07/23/2013 1450   HCO3 26.9* 07/23/2013 1450   TCO2 29 07/23/2013 1450   ACIDBASEDEF 2.0 07/23/2013 1450   O2SAT 93.0 07/23/2013 1450     No results found for this basename: TSH   BNP (last 3 results)  Recent Labs  05/29/13 0826 07/23/13 1008 08/14/13 1637  PROBNP 6106.0* 8081.0* 5977.0*   Cardiac Panel (last 3 results) No results found for this basename: CKTOTAL, CKMB, TROPONINI, RELINDX,  in the last 72 hours  Iron/TIBC/Ferritin    Component Value Date/Time   IRON 171* 07/24/2009 0552   TIBC 356 07/24/2009 0552   FERRITIN 224 07/24/2009 0552     EKG Orders placed during the hospital encounter of 10/23/13  . ED EKG  . ED EKG  . EKG 12-LEAD  . EKG 12-LEAD  . EKG     Prior Assessment and Plan Problem List as of 10/30/2013     Cardiovascular and Mediastinum   Coronary atherosclerosis of unspecified type of vessel, native or graft   Last Assessment & Plan   08/03/2013 Office Visit Written 08/03/2013  5:31 PM by Lendon Colonel, NP     He remains on Plavix and aspirin, has had no complaints of angina. He will need close followup concerning CAD symptoms and chronic heart failure. He will be seen by Dr. Harl Bowie in one month.    Hypertension   Last Assessment & Plan   07/03/2013 Office Visit Written 07/03/2013  3:05 PM by Lendon Colonel, NP     Essentially controlled today. Continue current medications.    NSTEMI (non-ST elevated myocardial infarction)   Last Assessment & Plan   02/10/2013 Office Visit Written 02/10/2013  3:27 PM by Lendon Colonel, NP     He is without cardiac complaint. He is medically compliant. He will continue with risk factor management with close follow up. No changes in his medications regimen.    Acute on chronic diastolic CHF (congestive heart failure), NYHA class 4   Last Assessment & Plan   08/03/2013 Office Visit Written  08/03/2013  5:29 PM by Lendon Colonel, NP     He has brought his weights from home with him as he is weighing himself daily. He has not been limiting his salt intake as much as I would like. He does have some mild lower extremity edema. I have refilled his Lasix as he was only given a five-day supply on discharge. He will need to take 60 mg in the morning and 60 mg in the evening. I have again reinforced the need for a low-sodium diet. He appears asymptomatic otherwise. He has some chronic dyspnea on exertion.   He will followup with Dr. Harl Bowie in one month. I have advised him that if he gains weight, has worsening dyspnea, or his edema worsens despite use of Lasix he is to call us for further instructions.        Atrial fibrillation   Last Assessment & Plan   08/03/2013 Office Visit Written 08/03/2013  5:30 PM by Lendon Colonel, NP     Heart rate is well-controlled, he continues on Coumadin, and has had dosing completed today in our [place] office. No other change in his medication regimen at this time.      Respiratory   CHRONIC OBSTRUCTIVE PULMONARY DISEASE   Last Assessment & Plan   03/19/2011 Office Visit Written 03/19/2011 12:48 PM by Yehuda Savannah, MD     Unfortunately, patient is not inclined to attempt to  discontinue cigarette smoking.    Acute respiratory failure with hypoxia     Digestive   DIVERTICULOSIS, COLON   GERD (gastroesophageal reflux disease)     Genitourinary   Nephrolithiasis     Other   HYPERLIPIDEMIA   Last Assessment & Plan   03/22/2012 Office Visit Written 03/22/2012  1:33 PM by Lendon Colonel, NP     I have advised him smoking cessation would help with cholesterol status. He will have followup labs completed by Dr. Luan Pulling next week. He verbalizes understanding.    HYPONATREMIA, CHRONIC   Last Assessment & Plan   03/19/2011 Office Visit Written 03/19/2011 12:52 PM by Yehuda Savannah, MD     Electrolytes will be reassessed.    ANEMIA    Last Assessment & Plan   08/10/2011 Office Visit Written 08/10/2011 12:32 PM by Mahala Menghini, PA     Chronic anemia. Heme neg X 3 in 05/2011, Dr. Lattie Haw. 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 Colonoscopy in near future.  I have discussed the risks, alternatives, benefits with regards to but not limited to the risk of reaction to medication, bleeding, infection, perforation and the patient is agreeable to proceed. Written consent to be obtained.     TOBACCO ABUSE   Last Assessment & Plan   03/30/2013 Office Visit Written 03/30/2013  3:52 PM by Lendon Colonel, NP     Unfortunately continues to smoke a minimum of one half pack of cigarettes a day. He is not inclined to quit. I reiterated the need to do so with his cardiovascular issues. He verbalizes understanding.    MITRAL VALVE REPLACEMENT, HX OF   AORTIC VALVE REPLACEMENT, HX OF   Last Assessment & Plan   02/10/2013 Office Visit Written 02/10/2013  3:32 PM by Lendon Colonel, NP     He continues on coumadin therapy. He will follow up in coumadin clinic for ongoing assessment of INR and dosing.    Chronic anticoagulation   Last Assessment & Plan   07/03/2013 Office Visit Written 07/03/2013  3:06 PM by Lendon Colonel, NP     Seeing Edrick Oh RN today in coumadin clinic. She will manage his dosing.    Encounter for therapeutic drug monitoring       Imaging: Dg Chest Portable 1 View  10/23/2013   CLINICAL DATA:  Epistaxis.  COPD.  Hypertension.  EXAM: PORTABLE CHEST - 1 VIEW  COMPARISON:  08/14/2013  FINDINGS: Previous median sternotomy, mitral valve replacement and aortic valve replacement. Chronic cardiomegaly. Pulmonary venous hypertension with mild interstitial edema, less than was seen previously. No significant effusion. No focal pulmonary abnormality.  IMPRESSION: Previous AVR and MVR. Cardiomegaly. Venous hypertension with mild interstitial edema, less than was seen in February.   Electronically Signed   By: Nelson Chimes M.D.   On:  10/23/2013 10:40

## 2013-10-30 NOTE — Assessment & Plan Note (Signed)
No chest pain. Continue current medication regimen.

## 2013-10-30 NOTE — Patient Instructions (Signed)
Your physician recommends that you schedule a follow-up appointment in: To be determined. We will call you with results.  Please take an extra 20 Torsemide tonight with your current dose.  Your physician recommends that you return for lab work this week. CBC

## 2013-10-31 ENCOUNTER — Ambulatory Visit (HOSPITAL_COMMUNITY)
Admission: RE | Admit: 2013-10-31 | Discharge: 2013-10-31 | Disposition: A | Discharge status: Home / Self Care | Payer: Medicare Other | Source: Ambulatory Visit | Attending: Adult Health | Admitting: Adult Health

## 2013-10-31 DIAGNOSIS — M25569 Pain in unspecified knee: Secondary | ICD-10-CM | POA: Insufficient documentation

## 2013-10-31 DIAGNOSIS — M259 Joint disorder, unspecified: Secondary | ICD-10-CM | POA: Insufficient documentation

## 2013-10-31 DIAGNOSIS — M25469 Effusion, unspecified knee: Secondary | ICD-10-CM | POA: Insufficient documentation

## 2013-10-31 LAB — CBC
HCT: 29.1 % — ABNORMAL LOW (ref 39.0–52.0)
HEMOGLOBIN: 9.7 g/dL — AB (ref 13.0–17.0)
MCH: 30.6 pg (ref 26.0–34.0)
MCHC: 33.3 g/dL (ref 30.0–36.0)
MCV: 91.8 fL (ref 78.0–100.0)
Platelets: 322 10*3/uL (ref 150–400)
RBC: 3.17 MIL/uL — ABNORMAL LOW (ref 4.22–5.81)
RDW: 14.6 % (ref 11.5–15.5)
WBC: 5.2 10*3/uL (ref 4.0–10.5)

## 2013-11-06 ENCOUNTER — Telehealth: Payer: Self-pay | Admitting: Cardiology

## 2013-11-06 ENCOUNTER — Other Ambulatory Visit (HOSPITAL_COMMUNITY): Payer: Self-pay | Admitting: Pulmonary Disease

## 2013-11-06 DIAGNOSIS — M7989 Other specified soft tissue disorders: Secondary | ICD-10-CM

## 2013-11-06 MED ORDER — PANTOPRAZOLE SODIUM 40 MG PO TBEC: 40.0000 mg | DELAYED_RELEASE_TABLET | Freq: Every day | ORAL | Status: DC

## 2013-11-06 NOTE — Telephone Encounter (Signed)
Medication sent via escribe.  

## 2013-11-06 NOTE — Telephone Encounter (Signed)
Received fax refill request  Rx # [id number] Medication:  Pantoprazole 40 mg tab Qty 30 Sig:  Take one tablet by mouth once daily Physician:  Harl Bowie

## 2013-11-07 ENCOUNTER — Ambulatory Visit (HOSPITAL_COMMUNITY)
Admission: RE | Admit: 2013-11-07 | Discharge: 2013-11-07 | Disposition: A | Discharge status: Home / Self Care | Payer: Medicare Other | Source: Ambulatory Visit | Attending: Pulmonary Disease | Admitting: Pulmonary Disease

## 2013-11-07 DIAGNOSIS — M7989 Other specified soft tissue disorders: Secondary | ICD-10-CM | POA: Insufficient documentation

## 2013-11-08 ENCOUNTER — Ambulatory Visit (INDEPENDENT_AMBULATORY_CARE_PROVIDER_SITE_OTHER): Payer: Medicare Other | Admitting: *Deleted

## 2013-11-08 DIAGNOSIS — Z954 Presence of other heart-valve replacement: Secondary | ICD-10-CM

## 2013-11-08 DIAGNOSIS — Z5181 Encounter for therapeutic drug level monitoring: Secondary | ICD-10-CM

## 2013-11-08 DIAGNOSIS — Z9889 Other specified postprocedural states: Secondary | ICD-10-CM

## 2013-11-08 LAB — POCT INR: INR: 3.1

## 2013-11-26 ENCOUNTER — Encounter (HOSPITAL_COMMUNITY): Payer: Self-pay | Admitting: Emergency Medicine

## 2013-11-26 ENCOUNTER — Inpatient Hospital Stay (HOSPITAL_COMMUNITY)
Admission: EM | Admit: 2013-11-26 | Discharge: 2013-12-06 | DRG: 207 | Disposition: A | Discharge status: Skilled Nursing Facility | Payer: Medicare Other | Attending: Pulmonary Disease | Admitting: Pulmonary Disease

## 2013-11-26 ENCOUNTER — Emergency Department (HOSPITAL_COMMUNITY): Payer: Medicare Other

## 2013-11-26 DIAGNOSIS — Z7901 Long term (current) use of anticoagulants: Secondary | ICD-10-CM

## 2013-11-26 DIAGNOSIS — I252 Old myocardial infarction: Secondary | ICD-10-CM

## 2013-11-26 DIAGNOSIS — F1020 Alcohol dependence, uncomplicated: Secondary | ICD-10-CM | POA: Diagnosis present

## 2013-11-26 DIAGNOSIS — K219 Gastro-esophageal reflux disease without esophagitis: Secondary | ICD-10-CM | POA: Diagnosis present

## 2013-11-26 DIAGNOSIS — G92 Toxic encephalopathy: Secondary | ICD-10-CM | POA: Diagnosis present

## 2013-11-26 DIAGNOSIS — J9620 Acute and chronic respiratory failure, unspecified whether with hypoxia or hypercapnia: Principal | ICD-10-CM | POA: Diagnosis present

## 2013-11-26 DIAGNOSIS — J449 Chronic obstructive pulmonary disease, unspecified: Secondary | ICD-10-CM

## 2013-11-26 DIAGNOSIS — Z951 Presence of aortocoronary bypass graft: Secondary | ICD-10-CM

## 2013-11-26 DIAGNOSIS — R791 Abnormal coagulation profile: Secondary | ICD-10-CM | POA: Diagnosis present

## 2013-11-26 DIAGNOSIS — E876 Hypokalemia: Secondary | ICD-10-CM | POA: Diagnosis not present

## 2013-11-26 DIAGNOSIS — I10 Essential (primary) hypertension: Secondary | ICD-10-CM

## 2013-11-26 DIAGNOSIS — J9601 Acute respiratory failure with hypoxia: Secondary | ICD-10-CM

## 2013-11-26 DIAGNOSIS — F1010 Alcohol abuse, uncomplicated: Secondary | ICD-10-CM | POA: Diagnosis present

## 2013-11-26 DIAGNOSIS — I4891 Unspecified atrial fibrillation: Secondary | ICD-10-CM | POA: Diagnosis present

## 2013-11-26 DIAGNOSIS — N183 Chronic kidney disease, stage 3 unspecified: Secondary | ICD-10-CM

## 2013-11-26 DIAGNOSIS — Z87442 Personal history of urinary calculi: Secondary | ICD-10-CM

## 2013-11-26 DIAGNOSIS — Z9889 Other specified postprocedural states: Secondary | ICD-10-CM

## 2013-11-26 DIAGNOSIS — Z954 Presence of other heart-valve replacement: Secondary | ICD-10-CM

## 2013-11-26 DIAGNOSIS — I509 Heart failure, unspecified: Secondary | ICD-10-CM | POA: Diagnosis present

## 2013-11-26 DIAGNOSIS — G929 Unspecified toxic encephalopathy: Secondary | ICD-10-CM | POA: Diagnosis present

## 2013-11-26 DIAGNOSIS — R569 Unspecified convulsions: Secondary | ICD-10-CM | POA: Diagnosis present

## 2013-11-26 DIAGNOSIS — I129 Hypertensive chronic kidney disease with stage 1 through stage 4 chronic kidney disease, or unspecified chronic kidney disease: Secondary | ICD-10-CM | POA: Diagnosis present

## 2013-11-26 DIAGNOSIS — D649 Anemia, unspecified: Secondary | ICD-10-CM

## 2013-11-26 DIAGNOSIS — E785 Hyperlipidemia, unspecified: Secondary | ICD-10-CM | POA: Diagnosis present

## 2013-11-26 DIAGNOSIS — F17200 Nicotine dependence, unspecified, uncomplicated: Secondary | ICD-10-CM | POA: Diagnosis present

## 2013-11-26 DIAGNOSIS — Z952 Presence of prosthetic heart valve: Secondary | ICD-10-CM

## 2013-11-26 DIAGNOSIS — I214 Non-ST elevation (NSTEMI) myocardial infarction: Secondary | ICD-10-CM

## 2013-11-26 DIAGNOSIS — E871 Hypo-osmolality and hyponatremia: Secondary | ICD-10-CM | POA: Diagnosis present

## 2013-11-26 DIAGNOSIS — N189 Chronic kidney disease, unspecified: Secondary | ICD-10-CM | POA: Diagnosis present

## 2013-11-26 DIAGNOSIS — Z5181 Encounter for therapeutic drug level monitoring: Secondary | ICD-10-CM

## 2013-11-26 DIAGNOSIS — I469 Cardiac arrest, cause unspecified: Secondary | ICD-10-CM | POA: Diagnosis present

## 2013-11-26 DIAGNOSIS — Z9981 Dependence on supplemental oxygen: Secondary | ICD-10-CM

## 2013-11-26 DIAGNOSIS — I2510 Atherosclerotic heart disease of native coronary artery without angina pectoris: Secondary | ICD-10-CM | POA: Diagnosis present

## 2013-11-26 DIAGNOSIS — I5033 Acute on chronic diastolic (congestive) heart failure: Secondary | ICD-10-CM | POA: Diagnosis present

## 2013-11-26 DIAGNOSIS — D599 Acquired hemolytic anemia, unspecified: Secondary | ICD-10-CM | POA: Diagnosis present

## 2013-11-26 DIAGNOSIS — J4489 Other specified chronic obstructive pulmonary disease: Secondary | ICD-10-CM | POA: Diagnosis present

## 2013-11-26 HISTORY — DX: Unspecified atrial fibrillation: I48.91

## 2013-11-26 LAB — COMPREHENSIVE METABOLIC PANEL
ALT: 13 U/L (ref 0–53)
AST: 26 U/L (ref 0–37)
Albumin: 3.2 g/dL — ABNORMAL LOW (ref 3.5–5.2)
Alkaline Phosphatase: 129 U/L — ABNORMAL HIGH (ref 39–117)
BILIRUBIN TOTAL: 0.5 mg/dL (ref 0.3–1.2)
BUN: 30 mg/dL — ABNORMAL HIGH (ref 6–23)
CHLORIDE: 85 meq/L — AB (ref 96–112)
CO2: 23 meq/L (ref 19–32)
Calcium: 8.3 mg/dL — ABNORMAL LOW (ref 8.4–10.5)
Creatinine, Ser: 1.38 mg/dL — ABNORMAL HIGH (ref 0.50–1.35)
GFR calc Af Amer: 59 mL/min — ABNORMAL LOW (ref >90)
GFR, EST NON AFRICAN AMERICAN: 51 mL/min — AB (ref >90)
Glucose, Bld: 120 mg/dL — ABNORMAL HIGH (ref 70–99)
Potassium: 4.5 mEq/L (ref 3.7–5.3)
SODIUM: 125 meq/L — AB (ref 137–147)
Total Protein: 7 g/dL (ref 6.0–8.3)

## 2013-11-26 LAB — CBC WITH DIFFERENTIAL/PLATELET
Basophils Absolute: 0 10*3/uL (ref 0.0–0.1)
Basophils Relative: 0 % (ref 0–1)
Eosinophils Absolute: 0.3 10*3/uL (ref 0.0–0.7)
Eosinophils Relative: 4 % (ref 0–5)
HCT: 29.7 % — ABNORMAL LOW (ref 39.0–52.0)
Hemoglobin: 9.2 g/dL — ABNORMAL LOW (ref 13.0–17.0)
LYMPHS ABS: 0.4 10*3/uL — AB (ref 0.7–4.0)
LYMPHS PCT: 6 % — AB (ref 12–46)
MCH: 28.2 pg (ref 26.0–34.0)
MCHC: 31 g/dL (ref 30.0–36.0)
MCV: 91.1 fL (ref 78.0–100.0)
Monocytes Absolute: 0.7 10*3/uL (ref 0.1–1.0)
Monocytes Relative: 11 % (ref 3–12)
Neutro Abs: 5.3 10*3/uL (ref 1.7–7.7)
Neutrophils Relative %: 79 % — ABNORMAL HIGH (ref 43–77)
PLATELETS: 312 10*3/uL (ref 150–400)
RBC: 3.26 MIL/uL — AB (ref 4.22–5.81)
RDW: 15.2 % (ref 11.5–15.5)
WBC: 6.7 10*3/uL (ref 4.0–10.5)

## 2013-11-26 LAB — MRSA PCR SCREENING: MRSA by PCR: NEGATIVE

## 2013-11-26 LAB — PROTIME-INR
INR: 3.5 — ABNORMAL HIGH (ref 0.00–1.49)
Prothrombin Time: 33.8 seconds — ABNORMAL HIGH (ref 11.6–15.2)

## 2013-11-26 LAB — MAGNESIUM: MAGNESIUM: 1.7 mg/dL (ref 1.5–2.5)

## 2013-11-26 LAB — PRO B NATRIURETIC PEPTIDE: Pro B Natriuretic peptide (BNP): 12192 pg/mL — ABNORMAL HIGH (ref 0–125)

## 2013-11-26 LAB — TROPONIN I

## 2013-11-26 MED ORDER — FERROUS SULFATE 325 (65 FE) MG PO TBEC: 325.0000 mg | DELAYED_RELEASE_TABLET | Freq: Every day | ORAL | Status: DC

## 2013-11-26 MED ORDER — CLOPIDOGREL BISULFATE 75 MG PO TABS
75.0000 mg | ORAL_TABLET | Freq: Every day | ORAL | Status: DC
  Administered 2013-11-27 – 2013-11-29 (×3): 75 mg via ORAL
  Filled 2013-11-26 (×3): qty 1

## 2013-11-26 MED ORDER — FUROSEMIDE 10 MG/ML IJ SOLN
60.0000 mg | Freq: Once | INTRAMUSCULAR | Status: AC
  Administered 2013-11-26: 60 mg via INTRAVENOUS

## 2013-11-26 MED ORDER — LORAZEPAM 0.5 MG PO TABS
1.0000 mg | ORAL_TABLET | Freq: Four times a day (QID) | ORAL | Status: DC | PRN
  Administered 2013-11-27: 1 mg via ORAL
  Filled 2013-11-26: qty 2

## 2013-11-26 MED ORDER — ADULT MULTIVITAMIN W/MINERALS CH
1.0000 | ORAL_TABLET | Freq: Every day | ORAL | Status: DC
  Administered 2013-11-26 – 2013-11-29 (×4): 1 via ORAL
  Filled 2013-11-26 (×4): qty 1

## 2013-11-26 MED ORDER — LORAZEPAM 2 MG/ML IJ SOLN: 1.0000 mg | Freq: Four times a day (QID) | INTRAMUSCULAR | Status: DC | PRN

## 2013-11-26 MED ORDER — METOPROLOL TARTRATE 50 MG PO TABS
100.0000 mg | ORAL_TABLET | Freq: Two times a day (BID) | ORAL | Status: DC
  Administered 2013-11-26 – 2013-11-29 (×6): 100 mg via ORAL
  Filled 2013-11-26 (×7): qty 2

## 2013-11-26 MED ORDER — VITAMIN B-1 100 MG PO TABS
100.0000 mg | ORAL_TABLET | Freq: Every day | ORAL | Status: DC
  Administered 2013-11-26 – 2013-12-06 (×5): 100 mg via ORAL
  Filled 2013-11-26 (×5): qty 1

## 2013-11-26 MED ORDER — LEVALBUTEROL HCL 0.63 MG/3ML IN NEBU
INHALATION_SOLUTION | RESPIRATORY_TRACT | Status: AC
  Administered 2013-11-26: 22:00:00
  Filled 2013-11-26: qty 3

## 2013-11-26 MED ORDER — MAGNESIUM OXIDE 400 (241.3 MG) MG PO TABS
400.0000 mg | ORAL_TABLET | Freq: Every day | ORAL | Status: DC
  Administered 2013-11-27 – 2013-11-29 (×3): 400 mg via ORAL
  Filled 2013-11-26 (×3): qty 1

## 2013-11-26 MED ORDER — FUROSEMIDE 10 MG/ML IJ SOLN
INTRAMUSCULAR | Status: AC
  Filled 2013-11-26: qty 2

## 2013-11-26 MED ORDER — SODIUM CHLORIDE 0.9 % IJ SOLN
3.0000 mL | INTRAMUSCULAR | Status: DC | PRN
  Administered 2013-11-27: 3 mL via INTRAVENOUS

## 2013-11-26 MED ORDER — FERROUS SULFATE 325 (65 FE) MG PO TABS
325.0000 mg | ORAL_TABLET | Freq: Every day | ORAL | Status: DC
  Administered 2013-11-27 – 2013-11-29 (×2): 325 mg via ORAL
  Filled 2013-11-26 (×2): qty 1

## 2013-11-26 MED ORDER — SODIUM CHLORIDE 0.9 % IV SOLN
INTRAVENOUS | Status: DC
  Administered 2013-11-26: 10 mL/h via INTRAVENOUS

## 2013-11-26 MED ORDER — LORAZEPAM 0.5 MG PO TABS
0.0000 mg | ORAL_TABLET | Freq: Four times a day (QID) | ORAL | Status: DC
  Administered 2013-11-27 (×2): 1 mg via ORAL
  Administered 2013-11-28: 2 mg via ORAL
  Filled 2013-11-26: qty 2

## 2013-11-26 MED ORDER — LORAZEPAM 0.5 MG PO TABS: 1.0000 mg | ORAL_TABLET | Freq: Four times a day (QID) | ORAL | Status: DC | PRN

## 2013-11-26 MED ORDER — PANTOPRAZOLE SODIUM 40 MG PO TBEC
40.0000 mg | DELAYED_RELEASE_TABLET | Freq: Every day | ORAL | Status: DC
  Administered 2013-11-26 – 2013-11-27 (×2): 40 mg via ORAL
  Filled 2013-11-26 (×2): qty 1

## 2013-11-26 MED ORDER — SODIUM CHLORIDE 0.9 % IV SOLN
250.0000 mL | INTRAVENOUS | Status: DC | PRN
  Administered 2013-11-28: 250 mL via INTRAVENOUS

## 2013-11-26 MED ORDER — NITROGLYCERIN 2 % TD OINT
0.5000 [in_us] | TOPICAL_OINTMENT | Freq: Four times a day (QID) | TRANSDERMAL | Status: DC
  Administered 2013-11-26 – 2013-11-28 (×6): 0.5 [in_us] via TOPICAL
  Filled 2013-11-26 (×7): qty 1

## 2013-11-26 MED ORDER — FUROSEMIDE 10 MG/ML IJ SOLN
INTRAMUSCULAR | Status: AC
  Filled 2013-11-26: qty 4

## 2013-11-26 MED ORDER — LORAZEPAM 2 MG/ML IJ SOLN
1.0000 mg | Freq: Four times a day (QID) | INTRAMUSCULAR | Status: DC | PRN
  Administered 2013-11-28: 1 mg via INTRAVENOUS
  Filled 2013-11-26: qty 1

## 2013-11-26 MED ORDER — FOLIC ACID 1 MG PO TABS
1.0000 mg | ORAL_TABLET | Freq: Every day | ORAL | Status: DC
  Administered 2013-11-26 – 2013-11-29 (×4): 1 mg via ORAL
  Filled 2013-11-26 (×4): qty 1

## 2013-11-26 MED ORDER — FUROSEMIDE 10 MG/ML IJ SOLN
60.0000 mg | Freq: Two times a day (BID) | INTRAMUSCULAR | Status: DC
  Administered 2013-11-27 – 2013-11-29 (×5): 60 mg via INTRAVENOUS
  Filled 2013-11-26 (×5): qty 6

## 2013-11-26 MED ORDER — LEVALBUTEROL HCL 0.63 MG/3ML IN NEBU
0.6300 mg | INHALATION_SOLUTION | Freq: Four times a day (QID) | RESPIRATORY_TRACT | Status: DC
  Administered 2013-11-27 – 2013-11-28 (×6): 0.63 mg via RESPIRATORY_TRACT
  Filled 2013-11-26 (×5): qty 3

## 2013-11-26 MED ORDER — AMLODIPINE BESYLATE 5 MG PO TABS
10.0000 mg | ORAL_TABLET | Freq: Every day | ORAL | Status: DC
  Administered 2013-11-27 – 2013-11-29 (×2): 10 mg via ORAL
  Filled 2013-11-26 (×3): qty 2

## 2013-11-26 MED ORDER — SODIUM CHLORIDE 0.9 % IJ SOLN
3.0000 mL | Freq: Two times a day (BID) | INTRAMUSCULAR | Status: DC
  Administered 2013-11-27 – 2013-12-05 (×18): 3 mL via INTRAVENOUS

## 2013-11-26 MED ORDER — LORAZEPAM 0.5 MG PO TABS
0.0000 mg | ORAL_TABLET | Freq: Two times a day (BID) | ORAL | Status: DC
  Filled 2013-11-26: qty 4

## 2013-11-26 MED ORDER — THIAMINE HCL 100 MG/ML IJ SOLN
100.0000 mg | Freq: Every day | INTRAMUSCULAR | Status: DC
  Administered 2013-11-28 – 2013-12-03 (×6): 100 mg via INTRAVENOUS
  Filled 2013-11-26 (×6): qty 2

## 2013-11-26 MED ORDER — ATORVASTATIN CALCIUM 40 MG PO TABS
40.0000 mg | ORAL_TABLET | Freq: Every day | ORAL | Status: DC
  Administered 2013-11-27 – 2013-11-29 (×3): 40 mg via ORAL
  Filled 2013-11-26 (×3): qty 1

## 2013-11-26 MED ORDER — NICOTINE 14 MG/24HR TD PT24
14.0000 mg | MEDICATED_PATCH | Freq: Every day | TRANSDERMAL | Status: DC
  Administered 2013-11-26 – 2013-12-06 (×11): 14 mg via TRANSDERMAL
  Filled 2013-11-26 (×11): qty 1

## 2013-11-26 NOTE — ED Provider Notes (Signed)
CSN: [id number]     Arrival date & time 11/26/13  1808 History  This chart was scribed for Leota Jacobsen, MD by Roxan Diesel, ED scribe.  This patient was seen in room APA02/APA02 and the patient's care was started at 6:18 PM.   Chief Complaint  Patient presents with  . Loss of Consciousness    The history is provided by the patient and a relative. No language interpreter was used.    HPI Comments: [name] is a 68 y.o. male with h/o COPD, A-fib (on Coumadin), CHF, chronic renal disease, HTN, and hyperlipidemia brought in by EMS to the Emergency Department for a syncopal episode that occurred pta.  Per EMS, pt was picked up by his granddaughter earlier today and when she put him in the car he suddenly started shaking and then "flopped over," went limp and became unresponsive for 2 minutes with his tongue hanging out.  On EMS arrival he was responsive again and there was no noted post-ictal state.  EMS noted a pulse of 90 and CBG of 141.  Pt states he does not remember feeling as if he was going to lose consciousness.  He denies preceding dizziness or lightheadedness.  He denies recent headaches, CP, or other recent illness.  Currently he feels slightly tired but he otherwise feels fine.  He denies worsened SOB, headache, or pain to any other area.  Pt has no h/o seizures.  Family notes that his oxygen tank is nearly empty.  He normally uses oxygen full-time at night and as needed during day.  His oxygen saturation is usually in the range of 90-93.     Past Medical History  Diagnosis Date  . Essential hypertension, benign   . Coronary atherosclerosis of native coronary artery     a. CABG/MVR/AVR-2003 (#25 St. Jude/#21 St. Jude); anticoagulation; b. negative stress nuclear-2005;  c. 01/2013 NSTEMI/Cath/PCI: LM nl, LAD 50p, 40-67m, D1 50ost, LCX 44m, RCA 50/7m (2.5x20 Promus DES), 50d, VG->Diag 100, VG->PDA 100, VG->OM 100, LIMA->LAD ok.  . Epistaxis     requiring cautery & aterial  ligation-09/2009  . GERD (gastroesophageal reflux disease)   . Hyperlipemia   . Abnormal LFTs     Possible cirrhosis  . Chronic anticoagulation   . Valvular heart disease     a. 2003: MVR/AVR-2003 (#25 St. Jude/#21 St. Jude);  b. 01/2013 Echo: EF 55%, Mech AVR mean grad 12, Mech MVR mean grad 6.  . Tobacco abuse     45 pack years  . Fasting hyperglycemia   . Nephrolithiasis   . Diverticulosis   . Hyponatremia   . Chronic diastolic CHF (congestive heart failure)   . Chronic renal disease   . Hemolytic anemia   . ETOH abuse   . A-fib 01/16/2013    a. On coumadin DCCV 07/2013.   Marland Kitchen COPD (chronic obstructive pulmonary disease)   . Hx of echocardiogram     a. Echo (07/2013):  EF 50-55%, normal MVR/AVR, mod LAE.    Past Surgical History  Procedure Laterality Date  . Endocopic sphenopalatine artery ligation & cautry    . Inguinal hernia repair      Left & right  . Coronary artery bypass graft  11/2001    [facility]  . Cardiac valve replacement  11/2001    AVR and MVR-St. Jude devices  . Cataract extraction w/phaco  01/20/2011    Procedure: CATARACT EXTRACTION PHACO AND INTRAOCULAR LENS PLACEMENT (IOC);  Surgeon: Elta Guadeloupe T. Gershon Crane;  Location: [facility];  Service: Ophthalmology;  Laterality: Right;  CDE: 8.51  . Cataract extraction w/phaco  02/03/2011    Procedure: CATARACT EXTRACTION PHACO AND INTRAOCULAR LENS PLACEMENT (IOC);  Surgeon: Elta Guadeloupe T. Gershon Crane;  Location: [facility];  Service: Ophthalmology;  Laterality: Left;  CDE:10.01  . Colonoscopy  04/05/02; 08/2011    friable anal canal hemorrhoids otherwise normal; 2 diminutive polyps excised, minimal diverticulosis noted  . Esophagogastroduodenoscopy  01/2002    Dr. Laural Golden, submucosal esophageal lesion c/w leiomyoma  . Colonoscopy  09/02/2011    Procedure: COLONOSCOPY;  Surgeon: Daneil Dolin, MD;  Location: [facility];  Service: Endoscopy;  Laterality: N/A;  8:15  . Yag laser application Right [id number]    Procedure: YAG LASER  APPLICATION;  Surgeon: Elta Guadeloupe T. Gershon Crane, MD;  Location: [facility];  Service: Ophthalmology;  Laterality: Right;    Family History  Problem Relation Age of Onset  . Hypotension Neg Hx   . Anesthesia problems Neg Hx   . Malignant hyperthermia Neg Hx   . Pseudochol deficiency Neg Hx   . Colon cancer Neg Hx   . Liver disease Neg Hx     History  Substance Use Topics  . Smoking status: Current Every Day Smoker -- 0.25 packs/day for 52 years    Types: Cigarettes  . Smokeless tobacco: Never Used     Comment: STATES HE IS CUTTING DOWN now only 1/2 ppd (01/27/2013)  . Alcohol Use: 4.2 oz/week    6 Cans of beer, 1 Shots of liquor per week     Comment: Daily     Review of Systems  Constitutional: Negative for fever.  Respiratory: Negative for shortness of breath.   Cardiovascular: Negative for chest pain.  Gastrointestinal: Negative for abdominal pain.  Neurological: Positive for syncope. Negative for dizziness, light-headedness and headaches.  All other systems reviewed and are negative.     Allergies  Review of patient's allergies indicates no known allergies.  Home Medications   Prior to Admission medications   Medication Sig Start Date End Date Taking? Authorizing Provider  amLODipine (NORVASC) 10 MG tablet Take 10 mg by mouth at bedtime.    Historical Provider, MD  atorvastatin (LIPITOR) 40 MG tablet TAKE ONE TABLET BY MOUTH EVERY DAY. **THIS REPLACES ZOCOR (SIMVASTATIN)** 07/31/13   Satira Sark, MD  clopidogrel (PLAVIX) 75 MG tablet Take 1 tablet (75 mg total) by mouth daily with breakfast. 07/03/13   Satira Sark, MD  ferrous sulfate 325 (65 FE) MG EC tablet Take 325 mg by mouth daily with breakfast.    Historical Provider, MD  folic acid (FOLVITE) 1 MG tablet Take 1 tablet (1 mg total) by mouth daily. 01/22/13   Brooke O Edmisten, PA-C  levalbuterol (XOPENEX) 0.63 MG/3ML nebulizer solution Take 3 mLs (0.63 mg total) by nebulization every 6 (six) hours as needed for  wheezing or shortness of breath. 01/22/13   Brooke O Edmisten, PA-C  magnesium oxide (MAG-OX) 400 (241.3 MG) MG tablet Take 1 tablet (400 mg total) by mouth daily. 09/12/13   Lendon Colonel, NP  metoprolol (LOPRESSOR) 100 MG tablet Take 1 tablet (100 mg total) by mouth 2 (two) times daily. 07/03/13   Satira Sark, MD  Multiple Vitamin (MULTIVITAMIN WITH MINERALS) TABS Take 1 tablet by mouth daily. 01/22/13   Brooke O Edmisten, PA-C  nitroGLYCERIN (NITROSTAT) 0.4 MG SL tablet Place 1 tablet (0.4 mg total) under the tongue every 5 (five) minutes x 3 doses as needed for chest  pain. 01/31/13   Rogelia Mire, NP  pantoprazole (PROTONIX) 40 MG tablet Take 1 tablet (40 mg total) by mouth daily. 11/06/13   Lendon Colonel, NP  potassium chloride SA (K-DUR,KLOR-CON) 20 MEQ tablet Take 20 mEq by mouth daily.    Historical Provider, MD  thiamine 100 MG tablet Take 1 tablet (100 mg total) by mouth daily. 01/22/13   Brooke O Edmisten, PA-C  torsemide (DEMADEX) 20 MG tablet Take 2 tablets (40 mg total) by mouth 2 (two) times daily. 09/12/13   Lendon Colonel, NP  warfarin (COUMADIN) 5 MG tablet Take 1 1/2 tablets (7.5 mg) on Mondays and  Fridays  take 1 tablet (5 mg) on Sunday, Tuesday, Wednesday Thursday and Saturday 07/31/13   Lendon Colonel, NP   BP 141/59  Pulse 69  Temp(Src) 97.5 F (36.4 C) (Oral)  Resp 26  SpO2 93%  Physical Exam  Nursing note and vitals reviewed. Constitutional: He is oriented to person, place, and time. He appears well-developed and well-nourished.  Non-toxic appearance. No distress.  HENT:  Head: Normocephalic and atraumatic.  Eyes: Conjunctivae, EOM and lids are normal. Pupils are equal, round, and reactive to light.  Neck: Normal range of motion. Neck supple. No tracheal deviation present. No mass present.  Cardiovascular: Normal rate, regular rhythm and normal heart sounds.  Exam reveals no gallop.   No murmur heard. Pulmonary/Chest: Effort normal. No  stridor. No respiratory distress. He has decreased breath sounds. He has no wheezes. He has no rhonchi. He has no rales.  Diminished breath sounds bilaterally  Abdominal: Soft. Normal appearance and bowel sounds are normal. He exhibits no distension. There is no tenderness. There is no rebound and no CVA tenderness.  Musculoskeletal: Normal range of motion. He exhibits edema. He exhibits no tenderness.  2 pitting edema to bilateral lower extremities  Neurological: He is alert and oriented to person, place, and time. He has normal strength. No cranial nerve deficit or sensory deficit. He exhibits normal muscle tone. He displays no seizure activity. Coordination normal. GCS eye subscore is 4. GCS verbal subscore is 5. GCS motor subscore is 6.  Skin: Skin is warm and dry. No abrasion and no rash noted.  Psychiatric: He has a normal mood and affect. His speech is normal and behavior is normal.    ED Course  Procedures (including critical care time)  DIAGNOSTIC STUDIES: Oxygen Saturation is 93% on [place], adequate by my interpretation.    COORDINATION OF CARE: 6:24 PM-Discussed treatment plan which includes IV fluids, head CT, CXR, and labs with pt at bedside and pt agreed to plan.     Labs Review Labs Reviewed  PRO B NATRIURETIC PEPTIDE - Abnormal; Notable for the following:    Pro B Natriuretic peptide (BNP) 12192.0 (*)    All other components within normal limits  CBC WITH DIFFERENTIAL - Abnormal; Notable for the following:    RBC 3.26 (*)    Hemoglobin 9.2 (*)    HCT 29.7 (*)    Neutrophils Relative % 79 (*)    Lymphocytes Relative 6 (*)    Lymphs Abs 0.4 (*)    All other components within normal limits  COMPREHENSIVE METABOLIC PANEL - Abnormal; Notable for the following:    Sodium 125 (*)    Chloride 85 (*)    Glucose, Bld 120 (*)    BUN 30 (*)    Creatinine, Ser 1.38 (*)    Calcium 8.3 (*)    Albumin 3.2 (*)  Alkaline Phosphatase 129 (*)    GFR calc non Af Amer 51 (*)     GFR calc Af Amer 59 (*)    All other components within normal limits  PROTIME-INR - Abnormal; Notable for the following:    Prothrombin Time 33.8 (*)    INR 3.50 (*)    All other components within normal limits  TROPONIN I    Imaging Review Dg Chest 2 View  11/26/2013   CLINICAL DATA:  Short of breath.  EXAM: CHEST  2 VIEW  COMPARISON:  10/23/2013  FINDINGS: Cardiac silhouette is mildly enlarged. There are changes from cardiac valve replacement. Mediastinum is normal in contour. No hilar masses.  There is hazy opacity in coarse reticular opacity in the lung bases. There is central interstitial thickening. No pleural effusion. No pneumothorax.  IMPRESSION: 1. Cardiomegaly, changes from cardiac valve replacement, in hazy lung base opacity and mild interstitial thickening suggest mild congestive heart failure. Lung base infiltrate should be considered if this patient has symptoms of pneumonia.   Electronically Signed   By: Lajean Manes M.D.   On: 11/26/2013 19:05   Ct Head Wo Contrast  11/26/2013   CLINICAL DATA:  Altered mental status, pain  EXAM: CT HEAD WITHOUT CONTRAST  TECHNIQUE: Contiguous axial images were obtained from the base of the skull through the vertex without intravenous contrast.  COMPARISON:  None.  FINDINGS: No skull fracture is noted. Paranasal sinuses and mastoid air cells are unremarkable. No intracranial hemorrhage, mass effect or midline shift. Mild cerebral atrophy. No acute infarction. No mass lesion is noted on this unenhanced scan. No hydrocephalus.  IMPRESSION: No acute intracranial abnormality. No definite acute cortical infarction. Mild cerebral atrophy.   Electronically Signed   By: Lahoma Crocker M.D.   On: 11/26/2013 18:52     EKG Interpretation   Date/Time:  Sunday Nov 26 2013 19:34:20 EDT Ventricular Rate:  64 PR Interval:  194 QRS Duration: 110 QT Interval:  486 QTC Calculation: 501 R Axis:   22 Text Interpretation:  Sinus rhythm Inferior infarct, old Lateral  leads are  also involved Prolonged QT interval No significant change since last  tracing Confirmed by Zenia Resides  MD, Noraa Pickeral ([id number]) on 11/26/2013 7:48:35 PM      MDM   Final diagnoses:  None   I personally performed the services described in this documentation, which was scribed in my presence. The recorded information has been reviewed and is accurate.  Patient noted to have likely CHF exacerbation along with hyponatremia. CT head was done to rule out intracranial process deep to what sounds like a seizure. His neurological exam is stable here. He will be admitted to step down by the hospitalist     Leota Jacobsen, MD 11/26/13 1949

## 2013-11-26 NOTE — ED Notes (Signed)
Pt stated IV placed in right wrist by EMS, prior to arrival to ED.

## 2013-11-26 NOTE — H&P (Signed)
[facility] History and Physical  [name] VEL:[id number] DOB: Jun 30, 1946 DOA: 11/26/2013   PCP: Alonza Bogus, MD  Specialists: Followed by cardiology, Dr. Harl Bowie  Chief Complaint: Possible seizure activity  HPI: [name] is a 68 y.o. male with a past medical history of diastolic heart failure, COPD on home oxygen, coronary artery disease, valve replacement surgeries on chronic anticoagulation, who was in his usual state of health earlier today when he was  getting up into his truck. It was somewhat challenging physically to do that. He was short of breath after he got into the truck and then his niece noticed that he leaned back and his arms started shaking. He started drooling. He was unresponsive. EMS was called and by the time they got there patient was talking appropriately. Patient is not a good historian, but denies any chest pain, now or earlier. Shortness of breath is as usual. Denies any headaches. No falls or injuries. No fever or chills recently. Has been nauseated, but denies any vomiting. No urinary incontinence. No tongue bites. Doesn't have any previous history of seizures. He was started on torsemide about a month ago, and previous to that he was on Lasix. He was hospitalized recently, about a few months ago. He had brief PEA cardiac arrest secondary to acute respiratory failure. He was transferred to [facility]. He was found to be in atrial fibrillation and required cardioversion. Patient continues to smoke cigarettes and is heavy alcohol consumer as well. He has gained approximately 16 pounds over the last few months.  Home Medications: Prior to Admission medications   Medication Sig Start Date End Date Taking? Authorizing Provider  amLODipine (NORVASC) 10 MG tablet Take 10 mg by mouth at bedtime.    Historical Provider, MD  atorvastatin (LIPITOR) 40 MG tablet TAKE ONE TABLET BY MOUTH EVERY DAY. **THIS REPLACES ZOCOR (SIMVASTATIN)** 07/31/13   Satira Sark, MD  clopidogrel (PLAVIX) 75 MG tablet Take 1 tablet (75 mg total) by mouth daily with breakfast. 07/03/13   Satira Sark, MD  ferrous sulfate 325 (65 FE) MG EC tablet Take 325 mg by mouth daily with breakfast.    Historical Provider, MD  folic acid (FOLVITE) 1 MG tablet Take 1 tablet (1 mg total) by mouth daily. 01/22/13   Brooke O Edmisten, PA-C  levalbuterol (XOPENEX) 0.63 MG/3ML nebulizer solution Take 3 mLs (0.63 mg total) by nebulization every 6 (six) hours as needed for wheezing or shortness of breath. 01/22/13   Brooke O Edmisten, PA-C  magnesium oxide (MAG-OX) 400 (241.3 MG) MG tablet Take 1 tablet (400 mg total) by mouth daily. 09/12/13   Lendon Colonel, NP  metoprolol (LOPRESSOR) 100 MG tablet Take 1 tablet (100 mg total) by mouth 2 (two) times daily. 07/03/13   Satira Sark, MD  Multiple Vitamin (MULTIVITAMIN WITH MINERALS) TABS Take 1 tablet by mouth daily. 01/22/13   Brooke O Edmisten, PA-C  nitroGLYCERIN (NITROSTAT) 0.4 MG SL tablet Place 1 tablet (0.4 mg total) under the tongue every 5 (five) minutes x 3 doses as needed for chest pain. 01/31/13   Rogelia Mire, NP  pantoprazole (PROTONIX) 40 MG tablet Take 1 tablet (40 mg total) by mouth daily. 11/06/13   Lendon Colonel, NP  potassium chloride SA (K-DUR,KLOR-CON) 20 MEQ tablet Take 20 mEq by mouth daily.    Historical Provider, MD  thiamine 100 MG tablet Take 1 tablet (100 mg total) by mouth daily. 01/22/13   Andrez Grime, PA-C  torsemide (DEMADEX) 20 MG tablet Take 2 tablets (40 mg total) by mouth 2 (two) times daily. 09/12/13   Lendon Colonel, NP  warfarin (COUMADIN) 5 MG tablet Take 1 1/2 tablets (7.5 mg) on Mondays and  Fridays  take 1 tablet (5 mg) on _0  pack years  . Fasting hyperglycemia   . Nephrolithiasis   . Diverticulosis   . Hyponatremia   . Chronic diastolic CHF (congestive heart failure)   . Chronic renal disease   . Hemolytic anemia   . ETOH abuse   . A-fib 01/16/2013    a. On coumadin DCCV 07/2013.   Marland Kitchen COPD (chronic obstructive pulmonary disease)   . Hx of echocardiogram     a. Echo (07/2013):  EF 50-55%, normal MVR/AVR, mod LAE.    Past Surgical History  Procedure Laterality Date  . Endocopic sphenopalatine artery ligation & cautry    . Inguinal hernia repair      Left & right  . Coronary artery bypass graft  11/2001    [facility]  . Cardiac valve replacement  11/2001    AVR and MVR-St. Jude devices  . Cataract extraction w/phaco  01/20/2011    Procedure: CATARACT EXTRACTION PHACO AND INTRAOCULAR LENS PLACEMENT (IOC);  Surgeon: Elta Guadeloupe T. Gershon Crane;  Location: [facility];  Service: Ophthalmology;  Laterality: Right;  CDE: 8.51  . Cataract extraction w/phaco  02/03/2011    Procedure: CATARACT EXTRACTION PHACO AND INTRAOCULAR LENS PLACEMENT (IOC);  Surgeon: Elta Guadeloupe T. Gershon Crane;  Location: [facility];  Service: Ophthalmology;  Laterality: Left;  CDE:10.01  . Colonoscopy  04/05/02; 08/2011    friable anal canal hemorrhoids otherwise normal; 2 diminutive polyps excised, minimal diverticulosis  noted  . Esophagogastroduodenoscopy  01/2002    Dr. Laural Golden, submucosal esophageal lesion c/w leiomyoma  . Colonoscopy  09/02/2011    Procedure: COLONOSCOPY;  Surgeon: Daneil Dolin, MD;  Location: [facility];  Service: Endoscopy;  Laterality: N/A;  8:15  . Yag laser application Right 94/70/9628    Procedure: YAG LASER APPLICATION;  Surgeon: Elta Guadeloupe T. Gershon Crane, MD;  Location: [facility];  Service: Ophthalmology;  Laterality: Right;    Social History: He lives alone. He has limited activity due to her dyspnea. Smokes about one third of a pack on a daily basis. Drinks 10-12 beers on a daily basis and upto to 4 shots of Shearon Stalls or [place] every week. No wine intake. Denies any illicit drug use. Last alcohol consumption was earlier this evening.  Family History:  Family History  Problem Relation Age of Onset  . Hypotension Neg Hx   . Anesthesia problems Neg Hx   . Malignant hyperthermia Neg Hx   . Pseudochol deficiency Neg Hx   . Colon cancer Neg Hx   . Liver disease Neg Hx      Review of Systems - History obtained from the patient and daughter and neice General ROS: positive for  - fatigue Psychological ROS: negative Ophthalmic ROS: negative ENT ROS: negative Allergy and Immunology ROS: negative Hematological and Lymphatic ROS: negative Endocrine ROS: negative Respiratory ROS: as in hpi Cardiovascular ROS: as in hpi Gastrointestinal ROS: no abdominal pain, change in bowel habits, or black or bloody stools Genito-Urinary ROS: no dysuria, trouble voiding, or hematuria Musculoskeletal ROS: negative Neurological ROS: as in hpi Dermatological ROS: negative  Physical Examination  Filed Vitals:   11/26/13 1813 11/26/13 1900 11/26/13 1915 11/26/13 1930  BP: 141/59 140/60  152/58  Pulse: 69  64 63  Temp: 97.5 F (36.4 C)     TempSrc: Oral     Resp: _0 SpO2: 93%  95% 93%    BP 152/58  Pulse 63  Temp(Src) 97.5 F (36.4 C) (Oral)  Resp 18  SpO2 93%  General  appearance: alert, cooperative, appears stated age and no distress Head: Normocephalic, without obvious abnormality, atraumatic Eyes: conjunctivae/corneas clear. PERRL, EOM's intact.  Throat: lips, mucosa, and tongue normal; teeth and gums normal Neck: no adenopathy, no carotid bruit, no JVD, supple, symmetrical, trachea midline and thyroid not enlarged, symmetric, no tenderness/mass/nodules Resp: Crackles about one third of the lung fields bilaterally. Occasional wheezing. Cardio: S1-S2 is normal. Regular. Mechanical sounds heard due to artificial valves. 2+ pitting edema bilaterally in lower extremities. GI: soft, non-tender; bowel sounds normal; no masses,  no organomegaly Extremities: 1-2+ pitting edema bilateral lower extremities. Good range of motion of both the knees. Pulses: 2+ and symmetric Skin: Skin color, texture, turgor normal. No rashes or lesions Neurologic: Alert and oriented x3. Cranial nerves intact 2-12. Motor strength equal bilateral upper and lower extremities. Gait not assessed.  Laboratory Data: Results for orders placed during the hospital encounter of 11/26/13 (from the past 48 hour(s))  PRO B NATRIURETIC PEPTIDE     Status: Abnormal   Collection Time    11/26/13  6:26 PM      Result Value Ref Range   Pro B Natriuretic peptide (BNP) 12192.0 (*) 0 - 125 pg/mL  TROPONIN I     Status: None   Collection Time    11/26/13  6:26 PM      Result Value Ref Range   Troponin I <0.30  <0.30 ng/mL   Comment:            Due to the release kinetics of cTnI,     a negative result within the first hours     of the onset of symptoms does not rule out     myocardial infarction with certainty.     If myocardial infarction is still suspected,     repeat the test at appropriate intervals.  CBC WITH DIFFERENTIAL     Status: Abnormal   Collection Time    11/26/13  6:26 PM      Result Value Ref Range   WBC 6.7  4.0 - 10.5 K/uL   RBC 3.26 (*) 4.22 - 5.81 MIL/uL   Hemoglobin 9.2  (*) 13.0 - 17.0 g/dL   HCT 29.7 (*) 39.0 - 52.0 %   MCV 91.1  78.0 - 100.0 fL   MCH 28.2  26.0 - 34.0 pg   MCHC 31.0  30.0 - 36.0 g/dL   RDW 15.2  11.5 - 15.5 %   Platelets 312  150 - 400 K/uL   Neutrophils Relative % 79 (*) 43 - 77 %   Neutro Abs 5.3  1.7 - 7.7 K/uL   Lymphocytes Relative 6 (*) 12 - 46 %   Lymphs Abs 0.4 (*) 0.7 - 4.0 K/uL   Monocytes Relative 11  3 - 12 %   Monocytes Absolute 0.7  0.1 - 1.0 K/uL   Eosinophils Relative 4  0 - 5 %   Eosinophils Absolute 0.3  0.0 - 0.7 K/uL   Basophils Relative 0  0 - 1 %   Basophils Absolute 0.0  0.0 - 0.1 K/uL  COMPREHENSIVE METABOLIC PANEL     Status: Abnormal   Collection Time    11/26/13  6:26 PM      Result Value Ref Range   Sodium 125 (*) 137 - 147 mEq/L   Potassium 4.5  3.7 - 5.3 mEq/L   Chloride 85 (*) 96 - 112 mEq/L   CO2 23  19 - 32 mEq/L   Glucose, Bld 120 (*) 70 - 99 mg/dL   BUN 30 (*) 6 - 23 mg/dL   Creatinine, Ser 1.38 (*) 0.50 - 1.35 mg/dL   Calcium 8.3 (*) 8.4 - 10.5 mg/dL   Total Protein 7.0  6.0 - 8.3 g/dL   Albumin 3.2 (*) 3.5 - 5.2 g/dL   AST 26  0 - 37 U/L   ALT 13  0 - 53 U/L   Alkaline Phosphatase 129 (*) 39 - 117 U/L   Total Bilirubin 0.5  0.3 - 1.2 mg/dL   GFR calc non Af Amer 51 (*) >90 mL/min   GFR calc Af Amer 59 (*) >90 mL/min   Comment: (NOTE)     The eGFR has been calculated using the CKD EPI equation.     This calculation has not been validated in all clinical situations.     eGFR's persistently <90 mL/min signify possible Chronic Kidney     Disease.  PROTIME-INR     Status: Abnormal   Collection Time    11/26/13  6:26 PM      Result Value Ref Range   Prothrombin Time 33.8 (*) 11.6 - 15.2 seconds   INR 3.50 (*) 0.00 - 1.49    Radiology Reports: Dg Chest 2 View  11/26/2013   CLINICAL DATA:  Short of breath.  EXAM: CHEST  2 VIEW  COMPARISON:  10/23/2013  FINDINGS: Cardiac silhouette is mildly enlarged. There are changes from cardiac valve replacement. Mediastinum is normal in contour.  No hilar masses.  There is hazy opacity in coarse reticular opacity in the lung bases. There is central interstitial thickening. No pleural effusion. No pneumothorax.  IMPRESSION: 1. Cardiomegaly, changes from cardiac valve replacement, in hazy lung base opacity and mild interstitial thickening suggest mild congestive heart failure. Lung base infiltrate should be considered if this patient has symptoms of pneumonia.   Electronically Signed   By: Lajean Manes M.D.   On: 11/26/2013 19:05   Ct Head Wo Contrast  11/26/2013   CLINICAL DATA:  Altered mental status, pain  EXAM: CT HEAD WITHOUT CONTRAST  TECHNIQUE: Contiguous axial images were obtained from the base of the skull through the vertex without intravenous contrast.  COMPARISON:  None.  FINDINGS: No skull fracture is noted. Paranasal sinuses  and mastoid air cells are unremarkable. No intracranial hemorrhage, mass effect or midline shift. Mild cerebral atrophy. No acute infarction. No mass lesion is noted on this unenhanced scan. No hydrocephalus.  IMPRESSION: No acute intracranial abnormality. No definite acute cortical infarction. Mild cerebral atrophy.   Electronically Signed   By: Lahoma Crocker M.D.   On: 11/26/2013 18:52    Electrocardiogram: Sinus rhythm at 64 beats per minute. Normal axis. QT prolongation is noted. Q waves in inferior leads. Nonspecific ST and T wave changes. No significant changes compared to previous EKG.  Problem List  Principal Problem:   Seizure-like activity Active Problems:   ANEMIA   TOBACCO ABUSE   CHRONIC OBSTRUCTIVE PULMONARY DISEASE   MITRAL VALVE REPLACEMENT, HX OF   AORTIC VALVE REPLACEMENT, HX OF   Chronic anticoagulation   Coronary atherosclerosis of unspecified type of vessel, native or graft   Acute on chronic diastolic CHF (congestive heart failure), NYHA class 4   Hyponatremia   Atrial fibrillation status post cardioversion   Alcohol abuse   Assessment: This is a 68 year old, Caucasian male, who  presents with possible seizure type activity. He was short of breath when he got into history. ED physician mentioned that his oxygen tank was empty when he was brought into the ED. It is possible he became hypoxic, which could have caused seizure. CT does not show any concerning findings. He has significant history of alcohol abuse, but denies cessation from alcohol recently. He is also noted to be hyponatremic, which is possibly due to his volume status or could be due to diuretics.  Plan: #1 possible seizure type activity: He'll be admitted to the hospital. EEG will be obtained. No need for any antiepileptics at this time as the seizure is thought to be likely due to hypoxia. If he has any further seizure type activity he will require further workup. He hasn't stopped drinking recently so alcohol withdrawal seizure is less likely.  #2 acute on chronic diastolic CHF: X-ray shows pulmonary edema. He has edema in the lower extremities. He'll require aggressive diuresis. Echocardiogram was done earlier this year, which showed normal systolic function. Mechanical valves looked okay on that echocardiogram. Cardiology may be consulted if the patient does not improve as anticipated. We will trend troponin levels.  #3 hyponatremia: Sodium is not that low to cause seizure activity. He has had hyponatremia in the past as well. This is likely due to a combination of hypovolemia and diuretics. Monitor sodium levels closely.  #4 likely chronic kidney disease: monitor renal function and urine output closely.  #5 history of mitral and aortic valve replacement: He is on anticoagulation, which will be continued. Pharmacy to dose Coumadin.  #6 history of paroxysmal atrial fibrillation with cardioversion earlier this year: Rhythm is currently sinus. Continue to monitor her on telemetry.  #7 alcohol abuse: He consumes significant amounts of beer. He has been counseled strongly to quit consumption of alcohol is as it is  probably contributing to his heart failure. He'll be placed on Ativan protocol. He'll be given thiamine. Magnesium level will be checked.  #8 tobacco abuse: He's cut down significantly. Nicotine patch as needed.  #9 normocytic anemia: Hemoglobin appears to be stable compared to previous values. Continue to monitor.  #10 Lower extremity edema, left more than right with knee swelling: He's had evaluations for these recently with x-rays, as well as venous Doppler, which haven't revealed obvious abnormalities. I think this is most likely due to his CHF. PT and OT  will be consulted.   DVT Prophylaxis: He is on warfarin Code Status: Full code Family Communication: Discussed with the patient and his daughter  Disposition Plan: Admit to step down. Dr. Luan Pulling to assume care in the morning   Further management decisions will depend on results of further testing and patient's response to treatment.   Bonnielee Haff  [facility] Pager [phone]  If 7PM-7AM, please contact night-coverage [email or web address] Password [email or web address]  11/26/2013, 8:25 PM  Disclaimer: This note was dictated with voice recognition software. Similar sounding words can inadvertently be transcribed and may not be corrected upon review.

## 2013-11-26 NOTE — ED Notes (Addendum)
EMS notified that patient had passed out, and was not breathing, on arrival EMS found patient slumped over in vehicle and he was breathing, has had several moments of near vomiting but has not actually vomited per EMS, legs noted to have bilateral swelling and discoloration

## 2013-11-27 LAB — BASIC METABOLIC PANEL
BUN: 30 mg/dL — AB (ref 6–23)
CHLORIDE: 88 meq/L — AB (ref 96–112)
CO2: 32 meq/L (ref 19–32)
Calcium: 8.8 mg/dL (ref 8.4–10.5)
Creatinine, Ser: 1.36 mg/dL — ABNORMAL HIGH (ref 0.50–1.35)
GFR calc Af Amer: 60 mL/min — ABNORMAL LOW (ref >90)
GFR calc non Af Amer: 52 mL/min — ABNORMAL LOW (ref >90)
GLUCOSE: 106 mg/dL — AB (ref 70–99)
POTASSIUM: 5.8 meq/L — AB (ref 3.7–5.3)
SODIUM: 127 meq/L — AB (ref 137–147)

## 2013-11-27 LAB — TROPONIN I
Troponin I: <0.3 ng/mL (ref <0.30)
Troponin I: <0.3 ng/mL (ref <0.30)

## 2013-11-27 LAB — POTASSIUM: POTASSIUM: 4.9 meq/L (ref 3.7–5.3)

## 2013-11-27 LAB — AMMONIA: Ammonia: 35 umol/L (ref 11–60)

## 2013-11-27 MED ORDER — WARFARIN - PHARMACIST DOSING INPATIENT
Status: DC
  Administered 2013-11-27 – 2013-12-05 (×3)

## 2013-11-27 MED ORDER — WARFARIN SODIUM 5 MG PO TABS
5.0000 mg | ORAL_TABLET | Freq: Once | ORAL | Status: AC
  Administered 2013-11-27: 5 mg via ORAL
  Filled 2013-11-27: qty 1

## 2013-11-27 MED ORDER — SODIUM POLYSTYRENE SULFONATE 15 GM/60ML PO SUSP
15.0000 g | Freq: Once | ORAL | Status: AC
  Administered 2013-11-27: 15 g via ORAL
  Filled 2013-11-27: qty 60

## 2013-11-27 NOTE — Progress Notes (Signed)
Patient noncompliant with leaving venturi mask on. Patient was advised to lift mask to take a bite of food, then replace to catch his breath. Patient found multiple times with mask in hand or lap, off face, despite being told his O2 sats were critically low.

## 2013-11-27 NOTE — Progress Notes (Signed)
ANTICOAGULATION CONSULT NOTE - Initial Consult  Pharmacy Consult for Coumadin (chronic Rx PTA) Indication: mechanical valve   No Known Allergies  Patient Measurements: Height: 5\' 7"  (170.2 cm) Weight: 170 lb 3.1 oz (77.2 kg) IBW/kg (Calculated) : 66.1  Vital Signs: Temp: 97.6 F (36.4 C) (05/25 0742) Temp src: Axillary (05/25 0742) BP: 173/59 mmHg (05/25 0800) Pulse Rate: 66 (05/25 0800)  Labs:  Recent Labs  11/26/13 1826 11/27/13 0033 11/27/13 0626  HGB 9.2*  --   --   HCT 29.7*  --   --   PLT 312  --   --   LABPROT 33.8*  --   --   INR 3.50*  --   --   CREATININE 1.38*  --  1.36*  TROPONINI <0.30 <0.30 <0.30   Estimated Creatinine Clearance: 48.6 ml/min (by C-G formula based on Cr of 1.36).  Medical History: Past Medical History  Diagnosis Date  . Essential hypertension, benign   . Coronary atherosclerosis of native coronary artery     a. CABG/MVR/AVR-2003 (#25 St. Jude/#21 St. Jude); anticoagulation; b. negative stress nuclear-2005;  c. 01/2013 NSTEMI/Cath/PCI: LM nl, LAD 50p, 40-45m, D1 50ost, LCX 82m, RCA 50/77m (2.5x20 Promus DES), 50d, VG->Diag 100, VG->PDA 100, VG->OM 100, LIMA->LAD ok.  . Epistaxis     requiring cautery & aterial ligation-09/2009  . GERD (gastroesophageal reflux disease)   . Hyperlipemia   . Abnormal LFTs     Possible cirrhosis  . Chronic anticoagulation   . Valvular heart disease     a. 2003: MVR/AVR-2003 (#25 St. Jude/#21 St. Jude);  b. 01/2013 Echo: EF 55%, Mech AVR mean grad 12, Mech MVR mean grad 6.  . Tobacco abuse     45 pack years  . Fasting hyperglycemia   . Nephrolithiasis   . Diverticulosis   . Hyponatremia   . Chronic diastolic CHF (congestive heart failure)   . Chronic renal disease   . Hemolytic anemia   . ETOH abuse   . A-fib 01/16/2013    a. On coumadin DCCV 07/2013.   Marland Kitchen COPD (chronic obstructive pulmonary disease)   . Hx of echocardiogram     a. Echo (07/2013):  EF 50-55%, normal MVR/AVR, mod LAE.   Medications:   Prescriptions prior to admission  Medication Sig Dispense Refill  . amLODipine (NORVASC) 10 MG tablet Take 10 mg by mouth at bedtime.      Marland Kitchen atorvastatin (LIPITOR) 40 MG tablet Take 40 mg by mouth daily.      . clopidogrel (PLAVIX) 75 MG tablet Take 1 tablet (75 mg total) by mouth daily with breakfast.  30 tablet  6  . ferrous sulfate 325 (65 FE) MG EC tablet Take 325 mg by mouth daily with breakfast.      . folic acid (FOLVITE) 1 MG tablet Take 1 tablet (1 mg total) by mouth daily.  30 tablet  4  . levalbuterol (XOPENEX) 0.63 MG/3ML nebulizer solution Take 3 mLs (0.63 mg total) by nebulization every 6 (six) hours as needed for wheezing or shortness of breath.  3 mL  4  . magnesium oxide (MAG-OX) 400 (241.3 MG) MG tablet Take 1 tablet (400 mg total) by mouth daily.  30 tablet  6  . metoprolol (LOPRESSOR) 100 MG tablet Take 1 tablet (100 mg total) by mouth 2 (two) times daily.  60 tablet  6  . Multiple Vitamin (MULTIVITAMIN WITH MINERALS) TABS Take 1 tablet by mouth daily.      . pantoprazole (PROTONIX) 40  MG tablet Take 1 tablet (40 mg total) by mouth daily.  30 tablet  0  . potassium chloride SA (K-DUR,KLOR-CON) 20 MEQ tablet Take 20 mEq by mouth daily.      Marland Kitchen thiamine 100 MG tablet Take 1 tablet (100 mg total) by mouth daily.  30 tablet  4  . torsemide (DEMADEX) 20 MG tablet Take 2 tablets (40 mg total) by mouth 2 (two) times daily.  60 tablet  6  . warfarin (COUMADIN) 5 MG tablet Take 1 1/2 tablets (7.5 mg) on Mondays and  Fridays  take 1 tablet (5 mg) on Sunday, Tuesday, Wednesday Thursday and Saturday      . nitroGLYCERIN (NITROSTAT) 0.4 MG SL tablet Place 1 tablet (0.4 mg total) under the tongue every 5 (five) minutes x 3 doses as needed for chest pain.  25 tablet  3   Assessment: 68yo male on chronic Coumadin PTA for h/o mitral and aortic valve replacement.  INR is therapeutic on admission but is on high end.  Home dose is listed above.    Goal of Therapy:  INR 2.5 - 3.5  Monitor  platelets by anticoagulation protocol: Yes   Plan:   Coumadin 5mg  po today x 1 (discourage overshoot)  INR daily  Jordanne Elsbury A Kelda Azad 11/27/2013,9:05 AM

## 2013-11-27 NOTE — Consult Note (Signed)
[place] A. Merlene Laughter, MD     [email or web address]          [name] is an 68 y.o. male.   ASSESSMENT/PLAN: 1. Single episode shaking most consistent with tremors. The semiology seems atypical for seizures. The shaking spell seems to be due to toxic metabolic derangements which patient has. We will follow up the EEG which has been ordered. No need for seizure medications at this time.  2. Moderate encephalopathy likely due to toxic metabolic derangements. Ammonia level will be obtained.  3. Alcoholism. Patient appropriately has been started on DT prophylaxis and also thiamine.  This is a 68 year old man who had a spell witnessed by his granddaughter is present at the bedside today. She reports that he went up to get to his truck but apparently his oxygen was not on. It apparently may have ran out. He is on oxygen at nighttime Throughout the night and the daytime on as-needed basis. It appears that his tank was empty when he had this spell. She reports that he started shaking. The description is a fine amplitude high frequency tremors of the upper extremities. She reports no stiffening. He started drooling and was unresponsive. The patient is amnestic to the event. He is currently quite drowsy. Workup shows several metabolic abnormalities and edema consistent with congestive heart failure and hypoxia. It appears that his sensitivity in running in the low 90s with oxygen/Ventimask. He did have a sodium of 125 although this seems not low enough to cause seizures. There is no history of seizures. No dyspnea is reported per the chart. The patient currently is quite drowsy which limits the evaluation.   GENERAL:  He is dosing but in no acute distress.  HEENT: Supple. Atraumatic normocephalic.   ABDOMEN: soft But with increase girth noted.  EXTREMITIES: Erythema is noted of the feet and distal legs. There is 2-3+ pitting edema up to the knees.  BACK: Normal.  SKIN:  Normal by inspection.    MENTAL STATUS: The patient is sleeping but requires significant stem lesion to stay awake. He does note that he is in the hospital although he couldn't relate to me which hospital. He does follow commands and speaking simple normal sentences.  CRANIAL NERVES: Pupils are equal, round and reactive to light and accommodation; extra ocular movements are full, there is no significant nystagmus; visual fields are full; upper and lower facial muscles are normal in strength and symmetric, there is no flattening of the nasolabial folds; tongue is midline. MOTOR: Normal tone, bulk and strength; no pronator drift.  COORDINATION: The patient is noted to have frequent asterixis/flapping bilaterally. No dysmetria is noted. No tremors noted at this time.  REFLEXES: Deep tendon reflexes are symmetrical and normal.   SENSATION: Normal to light touch and pain.     Past Medical History  Diagnosis Date  . Essential hypertension, benign   . Coronary atherosclerosis of native coronary artery     a. CABG/MVR/AVR-2003 (#25 St. Jude/#21 St. Jude); anticoagulation; b. negative stress nuclear-2005;  c. 01/2013 NSTEMI/Cath/PCI: LM nl, LAD 50p, 40-63m, D1 50ost, LCX 9m, RCA 50/59m (2.5x20 Promus DES), 50d, VG->Diag 100, VG->PDA 100, VG->OM 100, LIMA->LAD ok.  . Epistaxis     requiring cautery & aterial ligation-09/2009  . GERD (gastroesophageal reflux disease)   . Hyperlipemia   . Abnormal LFTs     Possible cirrhosis  . Chronic anticoagulation   . Valvular heart disease     a. 2003: MVR/AVR-2003 (#25 St. Jude/#21 St.  Jude);  b. 01/2013 Echo: EF 55%, Mech AVR mean grad 12, Mech MVR mean grad 6.  . Tobacco abuse     45 pack years  . Fasting hyperglycemia   . Nephrolithiasis   . Diverticulosis   . Hyponatremia   . Chronic diastolic CHF (congestive heart failure)   . Chronic renal disease   . Hemolytic anemia   . ETOH abuse   . A-fib 01/16/2013    a. On coumadin DCCV 07/2013.   Marland Kitchen COPD  (chronic obstructive pulmonary disease)   . Hx of echocardiogram     a. Echo (07/2013):  EF 50-55%, normal MVR/AVR, mod LAE.    Past Surgical History  Procedure Laterality Date  . Endocopic sphenopalatine artery ligation & cautry    . Inguinal hernia repair      Left & right  . Coronary artery bypass graft  11/2001    [facility]  . Cardiac valve replacement  11/2001    AVR and MVR-St. Jude devices  . Cataract extraction w/phaco  01/20/2011    Procedure: CATARACT EXTRACTION PHACO AND INTRAOCULAR LENS PLACEMENT (IOC);  Surgeon: Loraine Leriche T. Nile Riggs;  Location: [facility];  Service: Ophthalmology;  Laterality: Right;  CDE: 8.51  . Cataract extraction w/phaco  02/03/2011    Procedure: CATARACT EXTRACTION PHACO AND INTRAOCULAR LENS PLACEMENT (IOC);  Surgeon: Loraine Leriche T. Nile Riggs;  Location: [facility];  Service: Ophthalmology;  Laterality: Left;  CDE:10.01  . Colonoscopy  04/05/02; 08/2011    friable anal canal hemorrhoids otherwise normal; 2 diminutive polyps excised, minimal diverticulosis noted  . Esophagogastroduodenoscopy  01/2002    Dr. Karilyn Cota, submucosal esophageal lesion c/w leiomyoma  . Colonoscopy  09/02/2011    Procedure: COLONOSCOPY;  Surgeon: Corbin Ade, MD;  Location: [facility];  Service: Endoscopy;  Laterality: N/A;  8:15  . Yag laser application Right 06/27/2013    Procedure: YAG LASER APPLICATION;  Surgeon: Loraine Leriche T. Nile Riggs, MD;  Location: [facility];  Service: Ophthalmology;  Laterality: Right;    Family History  Problem Relation Age of Onset  . Hypotension Neg Hx   . Anesthesia problems Neg Hx   . Malignant hyperthermia Neg Hx   . Pseudochol deficiency Neg Hx   . Colon cancer Neg Hx   . Liver disease Neg Hx     Social History:  reports that he has been smoking Cigarettes.  He has a 13 pack-year smoking history. He uses smokeless tobacco. He reports that he drinks about 4.2 ounces of alcohol per week. He reports that he does not use illicit drugs.  Allergies: No Known  Allergies  Medications: Prior to Admission medications   Medication Sig Start Date End Date Taking? Authorizing Provider  amLODipine (NORVASC) 10 MG tablet Take 10 mg by mouth at bedtime.   Yes Historical Provider, MD  atorvastatin (LIPITOR) 40 MG tablet Take 40 mg by mouth daily.   Yes Historical Provider, MD  clopidogrel (PLAVIX) 75 MG tablet Take 1 tablet (75 mg total) by mouth daily with breakfast. 07/03/13  Yes Jonelle Sidle, MD  ferrous sulfate 325 (65 FE) MG EC tablet Take 325 mg by mouth daily with breakfast.   Yes Historical Provider, MD  folic acid (FOLVITE) 1 MG tablet Take 1 tablet (1 mg total) by mouth daily. 01/22/13  Yes Brooke O Edmisten, PA-C  levalbuterol (XOPENEX) 0.63 MG/3ML nebulizer solution Take 3 mLs (0.63 mg total) by nebulization every 6 (six) hours as needed for wheezing or shortness of breath. 01/22/13  Yes  Brooke O Edmisten, PA-C  magnesium oxide (MAG-OX) 400 (241.3 MG) MG tablet Take 1 tablet (400 mg total) by mouth daily. 09/12/13  Yes Lendon Colonel, NP  metoprolol (LOPRESSOR) 100 MG tablet Take 1 tablet (100 mg total) by mouth 2 (two) times daily. 07/03/13  Yes Satira Sark, MD  Multiple Vitamin (MULTIVITAMIN WITH MINERALS) TABS Take 1 tablet by mouth daily. 01/22/13  Yes Brooke O Edmisten, PA-C  pantoprazole (PROTONIX) 40 MG tablet Take 1 tablet (40 mg total) by mouth daily. 11/06/13  Yes Lendon Colonel, NP  potassium chloride SA (K-DUR,KLOR-CON) 20 MEQ tablet Take 20 mEq by mouth daily.   Yes Historical Provider, MD  thiamine 100 MG tablet Take 1 tablet (100 mg total) by mouth daily. 01/22/13  Yes Brooke O Edmisten, PA-C  torsemide (DEMADEX) 20 MG tablet Take 2 tablets (40 mg total) by mouth 2 (two) times daily. 09/12/13  Yes Lendon Colonel, NP  warfarin (COUMADIN) 5 MG tablet Take 1 1/2 tablets (7.5 mg) on Mondays and  Fridays  take 1 tablet (5 mg) on Sunday, Tuesday, Wednesday Thursday and Saturday 07/31/13  Yes Lendon Colonel, NP  nitroGLYCERIN  (NITROSTAT) 0.4 MG SL tablet Place 1 tablet (0.4 mg total) under the tongue every 5 (five) minutes x 3 doses as needed for chest pain. 01/31/13   Rogelia Mire, NP    Scheduled Meds: . amLODipine  10 mg Oral QHS  . atorvastatin  40 mg Oral q1800  . clopidogrel  75 mg Oral Q breakfast  . ferrous sulfate  325 mg Oral Q breakfast  . folic acid  1 mg Oral Daily  . furosemide  60 mg Intravenous Q12H  . levalbuterol  0.63 mg Nebulization Q6H  . LORazepam  0-4 mg Oral Q6H   Followed by  . [START ON 11/28/2013] LORazepam  0-4 mg Oral Q12H  . magnesium oxide  400 mg Oral Daily  . metoprolol  100 mg Oral BID  . multivitamin with minerals  1 tablet Oral Daily  . nicotine  14 mg Transdermal Daily  . nitroGLYCERIN  0.5 inch Topical 4 times per day  . pantoprazole  40 mg Oral Daily  . sodium chloride  3 mL Intravenous Q12H  . thiamine  100 mg Oral Daily   Or  . thiamine  100 mg Intravenous Daily  . warfarin  5 mg Oral Once  . Warfarin - Pharmacist Dosing Inpatient   Does not apply Q24H   Continuous Infusions:  PRN Meds:.sodium chloride, LORazepam, LORazepam, sodium chloride   Blood pressure 167/66, pulse 72, temperature 97.7 F (36.5 C), temperature source Axillary, resp. rate 27, height $RemoveBe'5\' 7"'WZXevxTIN$  (1.702 m), weight 77.2 kg (170 lb 3.1 oz), SpO2 92.00%.   Results for orders placed during the hospital encounter of 11/26/13 (from the past 48 hour(s))  PRO B NATRIURETIC PEPTIDE     Status: Abnormal   Collection Time    11/26/13  6:26 PM      Result Value Ref Range   Pro B Natriuretic peptide (BNP) 12192.0 (*) 0 - 125 pg/mL  TROPONIN I     Status: None   Collection Time    11/26/13  6:26 PM      Result Value Ref Range   Troponin I <0.30  <0.30 ng/mL   Comment:            Due to the release kinetics of cTnI,     a negative result within the first hours  of the onset of symptoms does not rule out     myocardial infarction with certainty.     If myocardial infarction is still  suspected,     repeat the test at appropriate intervals.  CBC WITH DIFFERENTIAL     Status: Abnormal   Collection Time    11/26/13  6:26 PM      Result Value Ref Range   WBC 6.7  4.0 - 10.5 K/uL   RBC 3.26 (*) 4.22 - 5.81 MIL/uL   Hemoglobin 9.2 (*) 13.0 - 17.0 g/dL   HCT 29.7 (*) 39.0 - 52.0 %   MCV 91.1  78.0 - 100.0 fL   MCH 28.2  26.0 - 34.0 pg   MCHC 31.0  30.0 - 36.0 g/dL   RDW 15.2  11.5 - 15.5 %   Platelets 312  150 - 400 K/uL   Neutrophils Relative % 79 (*) 43 - 77 %   Neutro Abs 5.3  1.7 - 7.7 K/uL   Lymphocytes Relative 6 (*) 12 - 46 %   Lymphs Abs 0.4 (*) 0.7 - 4.0 K/uL   Monocytes Relative 11  3 - 12 %   Monocytes Absolute 0.7  0.1 - 1.0 K/uL   Eosinophils Relative 4  0 - 5 %   Eosinophils Absolute 0.3  0.0 - 0.7 K/uL   Basophils Relative 0  0 - 1 %   Basophils Absolute 0.0  0.0 - 0.1 K/uL  COMPREHENSIVE METABOLIC PANEL     Status: Abnormal   Collection Time    11/26/13  6:26 PM      Result Value Ref Range   Sodium 125 (*) 137 - 147 mEq/L   Potassium 4.5  3.7 - 5.3 mEq/L   Chloride 85 (*) 96 - 112 mEq/L   CO2 23  19 - 32 mEq/L   Glucose, Bld 120 (*) 70 - 99 mg/dL   BUN 30 (*) 6 - 23 mg/dL   Creatinine, Ser 1.38 (*) 0.50 - 1.35 mg/dL   Calcium 8.3 (*) 8.4 - 10.5 mg/dL   Total Protein 7.0  6.0 - 8.3 g/dL   Albumin 3.2 (*) 3.5 - 5.2 g/dL   AST 26  0 - 37 U/L   ALT 13  0 - 53 U/L   Alkaline Phosphatase 129 (*) 39 - 117 U/L   Total Bilirubin 0.5  0.3 - 1.2 mg/dL   GFR calc non Af Amer 51 (*) >90 mL/min   GFR calc Af Amer 59 (*) >90 mL/min   Comment: (NOTE)     The eGFR has been calculated using the CKD EPI equation.     This calculation has not been validated in all clinical situations.     eGFR's persistently <90 mL/min signify possible Chronic Kidney     Disease.  PROTIME-INR     Status: Abnormal   Collection Time    11/26/13  6:26 PM      Result Value Ref Range   Prothrombin Time 33.8 (*) 11.6 - 15.2 seconds   INR 3.50 (*) 0.00 - 1.49  MAGNESIUM      Status: None   Collection Time    11/26/13  6:26 PM      Result Value Ref Range   Magnesium 1.7  1.5 - 2.5 mg/dL  MRSA PCR SCREENING     Status: None   Collection Time    11/26/13  9:40 PM      Result Value Ref Range   MRSA by PCR  NEGATIVE  NEGATIVE   Comment:            The GeneXpert MRSA Assay (FDA     approved for NASAL specimens     only), is one component of a     comprehensive MRSA colonization     surveillance program. It is not     intended to diagnose MRSA     infection nor to guide or     monitor treatment for     MRSA infections.  TROPONIN I     Status: None   Collection Time    11/27/13 12:33 AM      Result Value Ref Range   Troponin I <0.30  <0.30 ng/mL   Comment:            Due to the release kinetics of cTnI,     a negative result within the first hours     of the onset of symptoms does not rule out     myocardial infarction with certainty.     If myocardial infarction is still suspected,     repeat the test at appropriate intervals.  BASIC METABOLIC PANEL     Status: Abnormal   Collection Time    11/27/13  6:26 AM      Result Value Ref Range   Sodium 127 (*) 137 - 147 mEq/L   Potassium 5.8 (*) 3.7 - 5.3 mEq/L   Comment: DELTA CHECK NOTED   Chloride 88 (*) 96 - 112 mEq/L   CO2 32  19 - 32 mEq/L   Glucose, Bld 106 (*) 70 - 99 mg/dL   BUN 30 (*) 6 - 23 mg/dL   Creatinine, Ser 1.36 (*) 0.50 - 1.35 mg/dL   Calcium 8.8  8.4 - 10.5 mg/dL   GFR calc non Af Amer 52 (*) >90 mL/min   GFR calc Af Amer 60 (*) >90 mL/min   Comment: (NOTE)     The eGFR has been calculated using the CKD EPI equation.     This calculation has not been validated in all clinical situations.     eGFR's persistently <90 mL/min signify possible Chronic Kidney     Disease.  TROPONIN I     Status: None   Collection Time    11/27/13  6:26 AM      Result Value Ref Range   Troponin I <0.30  <0.30 ng/mL   Comment:            Due to the release kinetics of cTnI,     a negative result  within the first hours     of the onset of symptoms does not rule out     myocardial infarction with certainty.     If myocardial infarction is still suspected,     repeat the test at appropriate intervals.  TROPONIN I     Status: None   Collection Time    11/27/13 12:30 PM      Result Value Ref Range   Troponin I <0.30  <0.30 ng/mL   Comment:            Due to the release kinetics of cTnI,     a negative result within the first hours     of the onset of symptoms does not rule out     myocardial infarction with certainty.     If myocardial infarction is still suspected,     repeat the test at appropriate intervals.    Dg Chest  2 View  11/26/2013   CLINICAL DATA:  Short of breath.  EXAM: CHEST  2 VIEW  COMPARISON:  10/23/2013  FINDINGS: Cardiac silhouette is mildly enlarged. There are changes from cardiac valve replacement. Mediastinum is normal in contour. No hilar masses.  There is hazy opacity in coarse reticular opacity in the lung bases. There is central interstitial thickening. No pleural effusion. No pneumothorax.  IMPRESSION: 1. Cardiomegaly, changes from cardiac valve replacement, in hazy lung base opacity and mild interstitial thickening suggest mild congestive heart failure. Lung base infiltrate should be considered if this patient has symptoms of pneumonia.   Electronically Signed   By: Lajean Manes M.D.   On: 11/26/2013 19:05   Ct Head Wo Contrast  11/26/2013   CLINICAL DATA:  Altered mental status, pain  EXAM: CT HEAD WITHOUT CONTRAST  TECHNIQUE: Contiguous axial images were obtained from the base of the skull through the vertex without intravenous contrast.  COMPARISON:  None.  FINDINGS: No skull fracture is noted. Paranasal sinuses and mastoid air cells are unremarkable. No intracranial hemorrhage, mass effect or midline shift. Mild cerebral atrophy. No acute infarction. No mass lesion is noted on this unenhanced scan. No hydrocephalus.  IMPRESSION: No acute intracranial  abnormality. No definite acute cortical infarction. Mild cerebral atrophy.   Electronically Signed   By: Lahoma Crocker M.D.   On: 11/26/2013 18:52        Ardie Mclennan A. Merlene Laughter, M.D.  Diplomate, [employer] adviser of Psychiatry and Neurology ( Neurology). 11/27/2013, 2:59 PM

## 2013-11-27 NOTE — Progress Notes (Signed)
Neurology consult called to Dr Merlene Laughter. Cardiology added to patient's treatment team, and will be called when office reopens tomorrow.

## 2013-11-27 NOTE — Progress Notes (Signed)
Subjective: He was admitted yesterday after what may have been a seizure. He was hypoxic. Today he is requiring oxygen by mask and were having trouble reading his O2 saturation.  Objective: Vital signs in last 24 hours: Temp:  [97.5 F (36.4 C)-98.7 F (37.1 C)] 97.6 F (36.4 C) (05/25 0742) Pulse Rate:  [49-78] 65 (05/25 0700) Resp:  [16-27] 22 (05/25 0700) BP: (114-157)/(53-89) 157/58 mmHg (05/25 0700) SpO2:  [81 %-97 %] 94 % (05/25 0700) FiO2 (%):  [50 %] 50 % (05/25 0706) Weight:  [77.2 kg (170 lb 3.1 oz)-78.3 kg (172 lb 9.9 oz)] 77.2 kg (170 lb 3.1 oz) (05/25 0400) Weight change:  Last BM Date: 11/24/13  Intake/Output from previous day: 05/24 0701 - 05/25 0700 In: 584.5 [P.O.:540; I.V.:44.5] Out: 850 [Urine:850]  PHYSICAL EXAM General appearance: alert, cooperative and moderate distress Resp: rales bilaterally and rhonchi bilaterally Cardio: regular rate and rhythm, S1, S2 normal, no murmur, click, rub or gallop GI: soft, non-tender; bowel sounds normal; no masses,  no organomegaly Extremities: He has much less edema than the last time I saw him in my office  Lab Results:    Basic Metabolic Panel:  Recent Labs  11/26/13 1826 11/27/13 0626  NA 125* 127*  K 4.5 5.8*  CL 85* 88*  CO2 23 32  GLUCOSE 120* 106*  BUN 30* 30*  CREATININE 1.38* 1.36*  CALCIUM 8.3* 8.8  MG 1.7  --    Liver Function Tests:  Recent Labs  11/26/13 1826  AST 26  ALT 13  ALKPHOS 129*  BILITOT 0.5  PROT 7.0  ALBUMIN 3.2*   No results found for this basename: LIPASE, AMYLASE,  in the last 72 hours No results found for this basename: AMMONIA,  in the last 72 hours CBC:  Recent Labs  11/26/13 1826  WBC 6.7  NEUTROABS 5.3  HGB 9.2*  HCT 29.7*  MCV 91.1  PLT 312   Cardiac Enzymes:  Recent Labs  11/26/13 1826 11/27/13 0033 11/27/13 0626  TROPONINI <0.30 <0.30 <0.30   BNP:  Recent Labs  11/26/13 1826  PROBNP 12192.0*   D-Dimer: No results found for this  basename: DDIMER,  in the last 72 hours CBG: No results found for this basename: GLUCAP,  in the last 72 hours Hemoglobin A1C: No results found for this basename: HGBA1C,  in the last 72 hours Fasting Lipid Panel: No results found for this basename: CHOL, HDL, LDLCALC, TRIG, CHOLHDL, LDLDIRECT,  in the last 72 hours Thyroid Function Tests: No results found for this basename: TSH, T4TOTAL, FREET4, T3FREE, THYROIDAB,  in the last 72 hours Anemia Panel: No results found for this basename: VITAMINB12, FOLATE, FERRITIN, TIBC, IRON, RETICCTPCT,  in the last 72 hours Coagulation:  Recent Labs  11/26/13 1826  LABPROT 33.8*  INR 3.50*   Urine Drug Screen: Drugs of Abuse  No results found for this basename: labopia, cocainscrnur, labbenz, amphetmu, thcu, labbarb    Alcohol Level: No results found for this basename: ETH,  in the last 72 hours Urinalysis: No results found for this basename: COLORURINE, APPERANCEUR, LABSPEC, PHURINE, GLUCOSEU, HGBUR, BILIRUBINUR, KETONESUR, PROTEINUR, UROBILINOGEN, NITRITE, LEUKOCYTESUR,  in the last 72 hours Misc. Labs:  ABGS No results found for this basename: PHART, PCO2, PO2ART, TCO2, HCO3,  in the last 72 hours CULTURES Recent Results (from the past 240 hour(s))  MRSA PCR SCREENING     Status: None   Collection Time    11/26/13  9:40 PM  Result Value Ref Range Status   MRSA by PCR NEGATIVE  NEGATIVE Final   Comment:            The GeneXpert MRSA Assay (FDA     approved for NASAL specimens     only), is one component of a     comprehensive MRSA colonization     surveillance program. It is not     intended to diagnose MRSA     infection nor to guide or     monitor treatment for     MRSA infections.   Studies/Results: Dg Chest 2 View  11/26/2013   CLINICAL DATA:  Short of breath.  EXAM: CHEST  2 VIEW  COMPARISON:  10/23/2013  FINDINGS: Cardiac silhouette is mildly enlarged. There are changes from cardiac valve replacement. Mediastinum is  normal in contour. No hilar masses.  There is hazy opacity in coarse reticular opacity in the lung bases. There is central interstitial thickening. No pleural effusion. No pneumothorax.  IMPRESSION: 1. Cardiomegaly, changes from cardiac valve replacement, in hazy lung base opacity and mild interstitial thickening suggest mild congestive heart failure. Lung base infiltrate should be considered if this patient has symptoms of pneumonia.   Electronically Signed   By: Lajean Manes M.D.   On: 11/26/2013 19:05   Ct Head Wo Contrast  11/26/2013   CLINICAL DATA:  Altered mental status, pain  EXAM: CT HEAD WITHOUT CONTRAST  TECHNIQUE: Contiguous axial images were obtained from the base of the skull through the vertex without intravenous contrast.  COMPARISON:  None.  FINDINGS: No skull fracture is noted. Paranasal sinuses and mastoid air cells are unremarkable. No intracranial hemorrhage, mass effect or midline shift. Mild cerebral atrophy. No acute infarction. No mass lesion is noted on this unenhanced scan. No hydrocephalus.  IMPRESSION: No acute intracranial abnormality. No definite acute cortical infarction. Mild cerebral atrophy.   Electronically Signed   By: Lahoma Crocker M.D.   On: 11/26/2013 18:52    Medications:  Prior to Admission:  Prescriptions prior to admission  Medication Sig Dispense Refill  . amLODipine (NORVASC) 10 MG tablet Take 10 mg by mouth at bedtime.      Marland Kitchen atorvastatin (LIPITOR) 40 MG tablet Take 40 mg by mouth daily.      . clopidogrel (PLAVIX) 75 MG tablet Take 1 tablet (75 mg total) by mouth daily with breakfast.  30 tablet  6  . ferrous sulfate 325 (65 FE) MG EC tablet Take 325 mg by mouth daily with breakfast.      . folic acid (FOLVITE) 1 MG tablet Take 1 tablet (1 mg total) by mouth daily.  30 tablet  4  . levalbuterol (XOPENEX) 0.63 MG/3ML nebulizer solution Take 3 mLs (0.63 mg total) by nebulization every 6 (six) hours as needed for wheezing or shortness of breath.  3 mL  4  .  magnesium oxide (MAG-OX) 400 (241.3 MG) MG tablet Take 1 tablet (400 mg total) by mouth daily.  30 tablet  6  . metoprolol (LOPRESSOR) 100 MG tablet Take 1 tablet (100 mg total) by mouth 2 (two) times daily.  60 tablet  6  . Multiple Vitamin (MULTIVITAMIN WITH MINERALS) TABS Take 1 tablet by mouth daily.      . pantoprazole (PROTONIX) 40 MG tablet Take 1 tablet (40 mg total) by mouth daily.  30 tablet  0  . potassium chloride SA (K-DUR,KLOR-CON) 20 MEQ tablet Take 20 mEq by mouth daily.      Marland Kitchen  thiamine 100 MG tablet Take 1 tablet (100 mg total) by mouth daily.  30 tablet  4  . torsemide (DEMADEX) 20 MG tablet Take 2 tablets (40 mg total) by mouth 2 (two) times daily.  60 tablet  6  . warfarin (COUMADIN) 5 MG tablet Take 1 1/2 tablets (7.5 mg) on Mondays and  Fridays  take 1 tablet (5 mg) on Sunday, Tuesday, Wednesday Thursday and Saturday      . nitroGLYCERIN (NITROSTAT) 0.4 MG SL tablet Place 1 tablet (0.4 mg total) under the tongue every 5 (five) minutes x 3 doses as needed for chest pain.  25 tablet  3   Scheduled: . amLODipine  10 mg Oral QHS  . atorvastatin  40 mg Oral q1800  . clopidogrel  75 mg Oral Q breakfast  . ferrous sulfate  325 mg Oral Q breakfast  . folic acid  1 mg Oral Daily  . furosemide  60 mg Intravenous Q12H  . levalbuterol  0.63 mg Nebulization Q6H  . LORazepam  0-4 mg Oral Q6H   Followed by  . [START ON 11/28/2013] LORazepam  0-4 mg Oral Q12H  . magnesium oxide  400 mg Oral Daily  . metoprolol  100 mg Oral BID  . multivitamin with minerals  1 tablet Oral Daily  . nicotine  14 mg Transdermal Daily  . nitroGLYCERIN  0.5 inch Topical 4 times per day  . pantoprazole  40 mg Oral Daily  . sodium chloride  3 mL Intravenous Q12H  . thiamine  100 mg Oral Daily   Or  . thiamine  100 mg Intravenous Daily   Continuous:  QQV:ZDGLOV chloride, LORazepam, LORazepam, sodium chloride  Assesment: He was admitted with what appeared within a seizure which may have been from  hypoxia. He is still somewhat hypoxic here in the hospital. He has multiple reasons for hypoxia including CHF and COPD. He will have neurology consultation.  He did not have overt CHF on chest x-ray but he has gained weight. His legs look okay so I think it may be more fluid in his abdomen  He has had both mitral and aortic valve replacement  He is hyponatremic.  His renal function is not normal but not severely abnormal but his potassium is up  He has continued alcohol and tobacco abuse  He is anemic but does not need a blood transfusion   Principal Problem:   Seizure-like activity Active Problems:   ANEMIA   TOBACCO ABUSE   CHRONIC OBSTRUCTIVE PULMONARY DISEASE   MITRAL VALVE REPLACEMENT, HX OF   AORTIC VALVE REPLACEMENT, HX OF   Chronic anticoagulation   Coronary atherosclerosis of unspecified type of vessel, native or graft   Acute on chronic diastolic CHF (congestive heart failure), NYHA class 4   Hyponatremia   Atrial fibrillation status post cardioversion   Alcohol abuse    Plan: Continue current treatments including oxygen Lasix et Ronney Asters. No seizure medications right now. Neurology consultation will be requested he will take Kayexalate. Cardiology consultation will be requested    LOS: 1 day   Alonza Bogus 11/27/2013, 8:39 AM

## 2013-11-28 ENCOUNTER — Inpatient Hospital Stay (HOSPITAL_COMMUNITY)
Admit: 2013-11-28 | Discharge: 2013-11-28 | Disposition: A | Discharge status: Home / Self Care | Payer: Medicare Other | Attending: Internal Medicine | Admitting: Internal Medicine

## 2013-11-28 ENCOUNTER — Encounter (HOSPITAL_COMMUNITY): Payer: Self-pay | Admitting: Cardiology

## 2013-11-28 ENCOUNTER — Inpatient Hospital Stay (HOSPITAL_COMMUNITY): Payer: Medicare Other

## 2013-11-28 DIAGNOSIS — Z7901 Long term (current) use of anticoagulants: Secondary | ICD-10-CM

## 2013-11-28 DIAGNOSIS — J9600 Acute respiratory failure, unspecified whether with hypoxia or hypercapnia: Secondary | ICD-10-CM

## 2013-11-28 DIAGNOSIS — Z954 Presence of other heart-valve replacement: Secondary | ICD-10-CM

## 2013-11-28 DIAGNOSIS — I2510 Atherosclerotic heart disease of native coronary artery without angina pectoris: Secondary | ICD-10-CM

## 2013-11-28 LAB — TSH: TSH: 0.684 u[IU]/mL (ref 0.350–4.500)

## 2013-11-28 LAB — BLOOD GAS, ARTERIAL
Acid-Base Excess: 5.4 mmol/L — ABNORMAL HIGH (ref 0.0–2.0)
Acid-Base Excess: 6.1 mmol/L — ABNORMAL HIGH (ref 0.0–2.0)
BICARBONATE: 32.9 meq/L — AB (ref 20.0–24.0)
Bicarbonate: 32.4 mEq/L — ABNORMAL HIGH (ref 20.0–24.0)
DRAWN BY: 25788
Drawn by: 25788
FIO2: 70 %
FIO2: 100 %
LHR: 12 {breaths}/min
MECHVT: 550 mL
O2 SAT: 85.3 %
O2 Saturation: 80.3 %
PCO2 ART: 85.1 mmHg — AB (ref 35.0–45.0)
PEEP/CPAP: 5 cmH2O
PH ART: 7.211 — AB (ref 7.350–7.450)
PH ART: 7.289 — AB (ref 7.350–7.450)
PO2 ART: 55.1 mmHg — AB (ref 80.0–100.0)
Patient temperature: 37
Patient temperature: 37
TCO2: 32 mmol/L (ref 0–100)
TCO2: 31.3 mmol/L (ref 0–100)
pCO2 arterial: 69.9 mmHg (ref 35.0–45.0)
pO2, Arterial: 57 mmHg — ABNORMAL LOW (ref 80.0–100.0)

## 2013-11-28 LAB — BASIC METABOLIC PANEL
BUN: 33 mg/dL — ABNORMAL HIGH (ref 6–23)
CO2: 33 meq/L — AB (ref 19–32)
CREATININE: 1.28 mg/dL (ref 0.50–1.35)
Calcium: 8.8 mg/dL (ref 8.4–10.5)
Chloride: 92 mEq/L — ABNORMAL LOW (ref 96–112)
GFR calc non Af Amer: 56 mL/min — ABNORMAL LOW (ref >90)
GFR, EST AFRICAN AMERICAN: 65 mL/min — AB (ref >90)
Glucose, Bld: 133 mg/dL — ABNORMAL HIGH (ref 70–99)
Potassium: 4.8 mEq/L (ref 3.7–5.3)
Sodium: 134 mEq/L — ABNORMAL LOW (ref 137–147)

## 2013-11-28 LAB — VITAMIN B12: VITAMIN B 12: 577 pg/mL (ref 211–911)

## 2013-11-28 LAB — PROTIME-INR
INR: 2.59 — ABNORMAL HIGH (ref 0.00–1.49)
Prothrombin Time: 26.9 seconds — ABNORMAL HIGH (ref 11.6–15.2)

## 2013-11-28 LAB — HOMOCYSTEINE: Homocysteine: 6.5 umol/L (ref 4.0–15.4)

## 2013-11-28 LAB — TRIGLYCERIDES: Triglycerides: 63 mg/dL (ref <150)

## 2013-11-28 MED ORDER — VANCOMYCIN HCL IN DEXTROSE 750-5 MG/150ML-% IV SOLN
750.0000 mg | Freq: Two times a day (BID) | INTRAVENOUS | Status: DC
  Administered 2013-11-28 – 2013-11-30 (×5): 750 mg via INTRAVENOUS
  Filled 2013-11-28 (×7): qty 150

## 2013-11-28 MED ORDER — ALBUTEROL SULFATE (2.5 MG/3ML) 0.083% IN NEBU: 2.5000 mg | INHALATION_SOLUTION | RESPIRATORY_TRACT | Status: DC | PRN

## 2013-11-28 MED ORDER — BIOTENE DRY MOUTH MT LIQD
15.0000 mL | Freq: Four times a day (QID) | OROMUCOSAL | Status: DC
  Administered 2013-11-29 – 2013-12-03 (×15): 15 mL via OROMUCOSAL

## 2013-11-28 MED ORDER — PROPOFOL 10 MG/ML IV EMUL
0.0000 ug/kg/min | INTRAVENOUS | Status: DC
  Administered 2013-11-28: 40 ug/kg/min via INTRAVENOUS
  Administered 2013-11-28: 20 ug/kg/min via INTRAVENOUS
  Administered 2013-11-28 – 2013-11-29 (×4): 40 ug/kg/min via INTRAVENOUS
  Administered 2013-11-29: 35 ug/kg/min via INTRAVENOUS
  Administered 2013-11-30: 50 ug/kg/min via INTRAVENOUS
  Administered 2013-12-01 (×2): 40.017 ug/kg/min via INTRAVENOUS
  Administered 2013-12-01 – 2013-12-02 (×4): 40 ug/kg/min via INTRAVENOUS
  Administered 2013-12-02 (×2): 50 ug/kg/min via INTRAVENOUS
  Administered 2013-12-02: 40.017 ug/kg/min via INTRAVENOUS
  Administered 2013-12-03: 50 ug/kg/min via INTRAVENOUS
  Filled 2013-11-28 (×4): qty 100
  Filled 2013-11-28: qty 200
  Filled 2013-11-28: qty 100
  Filled 2013-11-28: qty 200
  Filled 2013-11-28 (×9): qty 100
  Filled 2013-11-28: qty 200
  Filled 2013-11-28 (×4): qty 100
  Filled 2013-11-28: qty 200

## 2013-11-28 MED ORDER — IPRATROPIUM-ALBUTEROL 0.5-2.5 (3) MG/3ML IN SOLN
3.0000 mL | RESPIRATORY_TRACT | Status: DC
  Administered 2013-11-28 – 2013-12-03 (×30): 3 mL via RESPIRATORY_TRACT
  Filled 2013-11-28 (×31): qty 3

## 2013-11-28 MED ORDER — PANTOPRAZOLE SODIUM 40 MG IV SOLR
40.0000 mg | Freq: Every day | INTRAVENOUS | Status: DC
  Administered 2013-11-28 – 2013-12-04 (×7): 40 mg via INTRAVENOUS
  Filled 2013-11-28 (×7): qty 40

## 2013-11-28 MED ORDER — FUROSEMIDE 10 MG/ML IJ SOLN
80.0000 mg | Freq: Once | INTRAMUSCULAR | Status: AC
  Administered 2013-11-28: 80 mg via INTRAVENOUS
  Filled 2013-11-28: qty 8

## 2013-11-28 MED ORDER — WARFARIN SODIUM 7.5 MG PO TABS
7.5000 mg | ORAL_TABLET | Freq: Once | ORAL | Status: AC
  Administered 2013-11-28: 7.5 mg via ORAL
  Filled 2013-11-28: qty 1

## 2013-11-28 MED ORDER — CHLORHEXIDINE GLUCONATE 0.12 % MT SOLN
15.0000 mL | Freq: Two times a day (BID) | OROMUCOSAL | Status: DC
  Administered 2013-11-28 – 2013-12-03 (×11): 15 mL via OROMUCOSAL
  Filled 2013-11-28 (×11): qty 15

## 2013-11-28 MED ORDER — PIPERACILLIN-TAZOBACTAM 3.375 G IVPB
3.3750 g | Freq: Three times a day (TID) | INTRAVENOUS | Status: DC
  Administered 2013-11-28 – 2013-12-05 (×21): 3.375 g via INTRAVENOUS
  Filled 2013-11-28 (×22): qty 50

## 2013-11-28 MED ORDER — METHYLPREDNISOLONE SODIUM SUCC 125 MG IJ SOLR
60.0000 mg | Freq: Four times a day (QID) | INTRAMUSCULAR | Status: DC
  Administered 2013-11-28 – 2013-12-03 (×20): 60 mg via INTRAVENOUS
  Filled 2013-11-28 (×20): qty 2

## 2013-11-28 MED ORDER — BIOTENE DRY MOUTH MT LIQD
15.0000 mL | Freq: Four times a day (QID) | OROMUCOSAL | Status: DC
  Administered 2013-11-28 – 2013-12-01 (×11): 15 mL via OROMUCOSAL

## 2013-11-28 MED ORDER — MIDAZOLAM HCL 2 MG/2ML IJ SOLN
1.0000 mg | INTRAMUSCULAR | Status: DC | PRN
  Administered 2013-12-01: 1 mg via INTRAVENOUS
  Filled 2013-11-28: qty 2

## 2013-11-28 MED ORDER — IPRATROPIUM BROMIDE 0.02 % IN SOLN
RESPIRATORY_TRACT | Status: AC
  Filled 2013-11-28: qty 2.5

## 2013-11-28 MED ORDER — CHLORHEXIDINE GLUCONATE 0.12 % MT SOLN: 15.0000 mL | Freq: Two times a day (BID) | OROMUCOSAL | Status: DC

## 2013-11-28 NOTE — Consult Note (Signed)
Primary cardiologist: Dr. Carlyle Dolly Consulting cardiologist: Dr. Satira Sark  Clinical Summary [name] is a medically complex 68 y.o.male admitted to the hospital after an episode of shaking and unresponsiveness that occurred after he was reportedly more short of breath over baseline. My understanding is that he was found to be without supplemental oxygen at the time, has had subsequent trouble with recurrent hypoxia during hospitalization, only mild interstitial opacification by initial chest x-ray. He has developed hypoxic respiratory failure, required intubation with mechanical ventilation earlier this morning. He was noted to be more bradycardic in sinus rhythm at the time, but has had no recurring atrial fibrillation. Telemetry otherwise shows only limited ventricular bigeminy. His postintubation chest x-ray is more consistent with CHF and pro-BNP is significantly elevated around 12,000.  Neurology consultation is pending to exclude possible seizure activity. EEG has not yet been resulted.  ECG shows probable ectopic atrial rhythm with LVH and repolarization abnormalities. Troponin I levels have been negative, arguing against ACS. INR is therapeutic at 2.5.  Echocardiogram from January of this year reported LVEF 50-55%, grossly normal mechanical AVR, grossly normal mechanical MVR with mitral annular calcification and thickening, moderate left atrial enlargement.   No Known Allergies  Medications Scheduled Medications: . amLODipine  10 mg Oral QHS  . antiseptic oral rinse  15 mL Mouth Rinse QID  . atorvastatin  40 mg Oral q1800  . chlorhexidine  15 mL Mouth Rinse BID  . clopidogrel  75 mg Oral Q breakfast  . ferrous sulfate  325 mg Oral Q breakfast  . folic acid  1 mg Oral Daily  . furosemide  60 mg Intravenous Q12H  . furosemide  80 mg Intravenous Once  . ipratropium-albuterol  3 mL Nebulization Q4H  . LORazepam  0-4 mg Oral Q6H   Followed by  . LORazepam  0-4  mg Oral Q12H  . magnesium oxide  400 mg Oral Daily  . methylPREDNISolone (SOLU-MEDROL) injection  60 mg Intravenous Q6H  . metoprolol  100 mg Oral BID  . multivitamin with minerals  1 tablet Oral Daily  . nicotine  14 mg Transdermal Daily  . nitroGLYCERIN  0.5 inch Topical 4 times per day  . pantoprazole (PROTONIX) IV  40 mg Intravenous Daily  . sodium chloride  3 mL Intravenous Q12H  . thiamine  100 mg Oral Daily   Or  . thiamine  100 mg Intravenous Daily  . warfarin  7.5 mg Oral Once  . Warfarin - Pharmacist Dosing Inpatient   Does not apply Q24H    Infusions: . propofol 20 mcg/kg/min (11/28/13 0826)    PRN Medications: sodium chloride, albuterol, LORazepam, midazolam, sodium chloride   Past Medical History  Diagnosis Date  . Essential hypertension, benign   . Coronary atherosclerosis of native coronary artery     a. CABG/MVR/AVR-2003 (#25 St. Jude/#21 St. Jude); anticoagulation; b. negative stress nuclear-2005;  c. 01/2013 NSTEMI/Cath/PCI: LM nl, LAD 50p, 40-46m, D1 50ost, LCX 28m, RCA 50/71m (2.5x20 Promus DES), 50d, VG->Diag 100, VG->PDA 100, VG->OM 100, LIMA->LAD ok.  . Epistaxis     Requiring cautery & aterial ligation-09/2009  . GERD (gastroesophageal reflux disease)   . Hyperlipemia   . Abnormal LFTs     Possible cirrhosis  . Chronic anticoagulation   . Valvular heart disease     a. 2003: MVR/AVR-2003 (#25 St. Jude/#21 St. Jude);  b. 01/2013 Echo: EF 55%, Mech AVR mean grad 12, Mech MVR mean grad 6.  . Tobacco abuse  45 pack years  . Fasting hyperglycemia   . Nephrolithiasis   . Diverticulosis   . Hyponatremia   . Chronic diastolic CHF (congestive heart failure)   . Chronic renal disease   . Hemolytic anemia   . ETOH abuse   . Atrial fibrillation 01/16/2013    On coumadin DCCV 07/2013.   Marland Kitchen COPD (chronic obstructive pulmonary disease)     Past Surgical History  Procedure Laterality Date  . Endocopic sphenopalatine artery ligation & cautry    . Inguinal  hernia repair      Left & right  . Coronary artery bypass graft  11/2001    [facility]  . Cardiac valve replacement  11/2001    AVR and MVR-St. Jude devices  . Cataract extraction w/phaco  01/20/2011    Procedure: CATARACT EXTRACTION PHACO AND INTRAOCULAR LENS PLACEMENT (IOC);  Surgeon: Elta Guadeloupe T. Gershon Crane;  Location: [facility];  Service: Ophthalmology;  Laterality: Right;  CDE: 8.51  . Cataract extraction w/phaco  02/03/2011    Procedure: CATARACT EXTRACTION PHACO AND INTRAOCULAR LENS PLACEMENT (IOC);  Surgeon: Elta Guadeloupe T. Gershon Crane;  Location: [facility];  Service: Ophthalmology;  Laterality: Left;  CDE:10.01  . Colonoscopy  04/05/02; 08/2011    friable anal canal hemorrhoids otherwise normal; 2 diminutive polyps excised, minimal diverticulosis noted  . Esophagogastroduodenoscopy  01/2002    Dr. Laural Golden, submucosal esophageal lesion c/w leiomyoma  . Colonoscopy  09/02/2011    Procedure: COLONOSCOPY;  Surgeon: Daneil Dolin, MD;  Location: [facility];  Service: Endoscopy;  Laterality: N/A;  8:15  . Yag laser application Right 31/51/7616    Procedure: YAG LASER APPLICATION;  Surgeon: Elta Guadeloupe T. Gershon Crane, MD;  Location: [facility];  Service: Ophthalmology;  Laterality: Right;    Family History  Problem Relation Age of Onset  . Hypotension Neg Hx   . Anesthesia problems Neg Hx   . Malignant hyperthermia Neg Hx   . Pseudochol deficiency Neg Hx   . Colon cancer Neg Hx   . Liver disease Neg Hx     Social History [name] reports that he has been smoking Cigarettes.  He has a 13 pack-year smoking history. He uses smokeless tobacco. [name] reports that he drinks about 4.2 ounces of alcohol per week.  Review of Systems Unable to obtain as the patient is sedated and on the ventrilator.  Physical Examination Blood pressure 123/58, pulse 91, temperature 96.9 F (36.1 C), temperature source Axillary, resp. rate 30, height 5\' 7"  (1.702 m), weight 172 lb 9.9 oz (78.3 kg), SpO2 89.00%.  Intake/Output  Summary (Last 24 hours) at 11/28/13 1021 Last data filed at 11/28/13 0100  Gross per 24 hour  Intake    473 ml  Output   1195 ml  Net   -722 ml   Patient sedated, on ventilator. HEENT: Conjunctiva and lids normal, oropharynx unable to be examined with ET tube taped in place. Neck: Supple, elevated JVP, no carotid bruits, no thyromegaly. Lungs: Clear to auscultation with scattered rhonchi, nonlabored breathing at rest. Cardiac: Regular rate and rhythm, no S3, crisp prosthetic sounds in S1 and S2, no pericardial rub. Abdomen: Soft, nontender, bowel sounds diminished, no guarding or rebound. Extremities: No pitting edema, distal pulses 2+. Skin: Warm and dry. Musculoskeletal: No kyphosis. Neuropsychiatric: Sedated.   Lab Results  Basic Metabolic Panel:  Recent Labs Lab 11/26/13 1826 11/27/13 0626 11/27/13 1613 11/28/13 0412  NA 125* 127*  --  134*  K 4.5 5.8* 4.9 4.8  CL 85*  88*  --  92*  CO2 23 32  --  33*  GLUCOSE 120* 106*  --  133*  BUN 30* 30*  --  33*  CREATININE 1.38* 1.36*  --  1.28  CALCIUM 8.3* 8.8  --  8.8  MG 1.7  --   --   --     Liver Function Tests:  Recent Labs Lab 11/26/13 1826  AST 26  ALT 13  ALKPHOS 129*  BILITOT 0.5  PROT 7.0  ALBUMIN 3.2*    CBC:  Recent Labs Lab 11/26/13 1826  WBC 6.7  NEUTROABS 5.3  HGB 9.2*  HCT 29.7*  MCV 91.1  PLT 312    Cardiac Enzymes:  Recent Labs Lab 11/26/13 1826 11/27/13 0033 11/27/13 0626 11/27/13 1230  TROPONINI <0.30 <0.30 <0.30 <0.30    Pro-BNP: 12192   Imaging PORTABLE CHEST - 1 VIEW  COMPARISON: Two-view chest 11/26/2013.  FINDINGS:  The heart is enlarged. The patient is status post median sternotomy  for CABG and valve replacement. The tip of the endotracheal tube is  4.8 cm above the carina, within normal limits. An NG tube courses  off the inferior border of the film. Moderate bilateral edema is now  present. Bilateral pleural effusions are present, right greater than    left. Bibasilar airspace disease likely reflects atelectasis.  Infection is not excluded.  IMPRESSION:  1. Congestive heart failure with bilateral edema, pleural effusions,  and airspace disease, right greater than left.  2. The airspace disease likely reflects atelectasis. Infection is  not excluded.  3. Satisfactory positioning of the endotracheal tube 4.8 cm above  the carina.   Impression  1. Hypoxic respiratory failure, currently requiring mechanical ventilation for support. Multifactorial in the setting of oxygen requiring COPD, diastolic heart failure. Post intubation chest x-ray shows bilateral pulmonary edema and pleural effusions, right greater than left.  2. Question seizure-like activity, and neurology workup pending.  3. Multivessel CAD status post CABG as outlined above. Cardiac markers argue against ACS. ECG without acute ischemic changes.  4. Valvular heart disease status post St. Jude mechanical AVR and MVR. Last echocardiogram was in January of this year. On Coumadin chronically.  5. History of paroxysmal atrial fibrillation, currently in sinus rhythm.  6. Alcohol abuse.  7. Acute on chronic diastolic heart failure, most recent LVEF 50-55%.   Recommendations  Presently hemodynamically stable, sedated on the ventilator. Agree with conversion to IV Lasix. Otherwise continue cardiac regimen including Norvasc, Lipitor, Plavix, Lopressor, and Coumadin. He is beginning to diurese, followup intake and output closely. Repeat BMET in a.m. Not clear that we need a followup echocardiogram as yet, although can consider depending on how he does clinically.  Satira Sark, M.D., F.A.C.C.

## 2013-11-28 NOTE — Progress Notes (Signed)
Subjective: He has developed significantly more difficulty breathing. He is not tolerating BiPAP. His oxygen saturation is in the 60s and 70s.  Objective: Vital signs in last 24 hours: Temp:  [96.5 F (35.8 C)-98.1 F (36.7 C)] 98.1 F (36.7 C) (05/26 0400) Pulse Rate:  [66-138] 79 (05/26 0400) Resp:  [17-29] 28 (05/26 0400) BP: (108-173)/(44-118) 141/67 mmHg (05/26 0600) SpO2:  [73 %-99 %] 89 % (05/26 0400) FiO2 (%):  [50 %-60 %] 60 % (05/26 0742) Weight:  [78.3 kg (172 lb 9.9 oz)] 78.3 kg (172 lb 9.9 oz) (05/26 0500) Weight change: 0 kg (0 lb) Last BM Date: 11/24/13  Intake/Output from previous day: 05/25 0701 - 05/26 0700 In: 713 [P.O.:713] Out: 1395 [Urine:1395]  PHYSICAL EXAM General appearance: He is awake and responsive but severely agitated. He is very dyspneic in appearance. Resp: rhonchi bilaterally Cardio: Heart sounds are obscured by his airway noise. GI: soft, non-tender; bowel sounds normal; no masses,  no organomegaly Extremities: venous stasis dermatitis noted  Lab Results:    Basic Metabolic Panel:  Recent Labs  11/26/13 1826 11/27/13 0626 11/27/13 1613 11/28/13 0412  NA 125* 127*  --  134*  K 4.5 5.8* 4.9 4.8  CL 85* 88*  --  92*  CO2 23 32  --  33*  GLUCOSE 120* 106*  --  133*  BUN 30* 30*  --  33*  CREATININE 1.38* 1.36*  --  1.28  CALCIUM 8.3* 8.8  --  8.8  MG 1.7  --   --   --    Liver Function Tests:  Recent Labs  11/26/13 1826  AST 26  ALT 13  ALKPHOS 129*  BILITOT 0.5  PROT 7.0  ALBUMIN 3.2*   No results found for this basename: LIPASE, AMYLASE,  in the last 72 hours  Recent Labs  11/27/13 1613  AMMONIA 35   CBC:  Recent Labs  11/26/13 1826  WBC 6.7  NEUTROABS 5.3  HGB 9.2*  HCT 29.7*  MCV 91.1  PLT 312   Cardiac Enzymes:  Recent Labs  11/27/13 0033 11/27/13 0626 11/27/13 1230  TROPONINI <0.30 <0.30 <0.30   BNP:  Recent Labs  11/26/13 1826  PROBNP 12192.0*   D-Dimer: No results found for this  basename: DDIMER,  in the last 72 hours CBG: No results found for this basename: GLUCAP,  in the last 72 hours Hemoglobin A1C: No results found for this basename: HGBA1C,  in the last 72 hours Fasting Lipid Panel: No results found for this basename: CHOL, HDL, LDLCALC, TRIG, CHOLHDL, LDLDIRECT,  in the last 72 hours Thyroid Function Tests: No results found for this basename: TSH, T4TOTAL, FREET4, T3FREE, THYROIDAB,  in the last 72 hours Anemia Panel: No results found for this basename: VITAMINB12, FOLATE, FERRITIN, TIBC, IRON, RETICCTPCT,  in the last 72 hours Coagulation:  Recent Labs  11/26/13 1826 11/28/13 0412  LABPROT 33.8* 26.9*  INR 3.50* 2.59*   Urine Drug Screen: Drugs of Abuse  No results found for this basename: labopia, cocainscrnur, labbenz, amphetmu, thcu, labbarb    Alcohol Level: No results found for this basename: ETH,  in the last 72 hours Urinalysis: No results found for this basename: COLORURINE, APPERANCEUR, LABSPEC, PHURINE, GLUCOSEU, HGBUR, BILIRUBINUR, KETONESUR, PROTEINUR, UROBILINOGEN, NITRITE, LEUKOCYTESUR,  in the last 72 hours Misc. Labs:  ABGS  Recent Labs  11/28/13 0726  PHART 7.211*  PO2ART 55.1*  TCO2 32  HCO3 32.9*   CULTURES Recent Results (from the past 240 hour(s))  MRSA PCR SCREENING     Status: None   Collection Time    11/26/13  9:40 PM      Result Value Ref Range Status   MRSA by PCR NEGATIVE  NEGATIVE Final   Comment:            The GeneXpert MRSA Assay (FDA     approved for NASAL specimens     only), is one component of a     comprehensive MRSA colonization     surveillance program. It is not     intended to diagnose MRSA     infection nor to guide or     monitor treatment for     MRSA infections.   Studies/Results: Dg Chest 2 View  11/26/2013   CLINICAL DATA:  Short of breath.  EXAM: CHEST  2 VIEW  COMPARISON:  10/23/2013  FINDINGS: Cardiac silhouette is mildly enlarged. There are changes from cardiac valve  replacement. Mediastinum is normal in contour. No hilar masses.  There is hazy opacity in coarse reticular opacity in the lung bases. There is central interstitial thickening. No pleural effusion. No pneumothorax.  IMPRESSION: 1. Cardiomegaly, changes from cardiac valve replacement, in hazy lung base opacity and mild interstitial thickening suggest mild congestive heart failure. Lung base infiltrate should be considered if this patient has symptoms of pneumonia.   Electronically Signed   By: Lajean Manes M.D.   On: 11/26/2013 19:05   Ct Head Wo Contrast  11/26/2013   CLINICAL DATA:  Altered mental status, pain  EXAM: CT HEAD WITHOUT CONTRAST  TECHNIQUE: Contiguous axial images were obtained from the base of the skull through the vertex without intravenous contrast.  COMPARISON:  None.  FINDINGS: No skull fracture is noted. Paranasal sinuses and mastoid air cells are unremarkable. No intracranial hemorrhage, mass effect or midline shift. Mild cerebral atrophy. No acute infarction. No mass lesion is noted on this unenhanced scan. No hydrocephalus.  IMPRESSION: No acute intracranial abnormality. No definite acute cortical infarction. Mild cerebral atrophy.   Electronically Signed   By: Lahoma Crocker M.D.   On: 11/26/2013 18:52    Medications:  Prior to Admission:  Prescriptions prior to admission  Medication Sig Dispense Refill  . amLODipine (NORVASC) 10 MG tablet Take 10 mg by mouth at bedtime.      Marland Kitchen atorvastatin (LIPITOR) 40 MG tablet Take 40 mg by mouth daily.      . clopidogrel (PLAVIX) 75 MG tablet Take 1 tablet (75 mg total) by mouth daily with breakfast.  30 tablet  6  . ferrous sulfate 325 (65 FE) MG EC tablet Take 325 mg by mouth daily with breakfast.      . folic acid (FOLVITE) 1 MG tablet Take 1 tablet (1 mg total) by mouth daily.  30 tablet  4  . levalbuterol (XOPENEX) 0.63 MG/3ML nebulizer solution Take 3 mLs (0.63 mg total) by nebulization every 6 (six) hours as needed for wheezing or  shortness of breath.  3 mL  4  . magnesium oxide (MAG-OX) 400 (241.3 MG) MG tablet Take 1 tablet (400 mg total) by mouth daily.  30 tablet  6  . metoprolol (LOPRESSOR) 100 MG tablet Take 1 tablet (100 mg total) by mouth 2 (two) times daily.  60 tablet  6  . Multiple Vitamin (MULTIVITAMIN WITH MINERALS) TABS Take 1 tablet by mouth daily.      . pantoprazole (PROTONIX) 40 MG tablet Take 1 tablet (40 mg total) by mouth daily.  30 tablet  0  . potassium chloride SA (K-DUR,KLOR-CON) 20 MEQ tablet Take 20 mEq by mouth daily.      Marland Kitchen thiamine 100 MG tablet Take 1 tablet (100 mg total) by mouth daily.  30 tablet  4  . torsemide (DEMADEX) 20 MG tablet Take 2 tablets (40 mg total) by mouth 2 (two) times daily.  60 tablet  6  . warfarin (COUMADIN) 5 MG tablet Take 1 1/2 tablets (7.5 mg) on Mondays and  Fridays  take 1 tablet (5 mg) on Sunday, Tuesday, Wednesday Thursday and Saturday      . nitroGLYCERIN (NITROSTAT) 0.4 MG SL tablet Place 1 tablet (0.4 mg total) under the tongue every 5 (five) minutes x 3 doses as needed for chest pain.  25 tablet  3   Scheduled: . amLODipine  10 mg Oral QHS  . atorvastatin  40 mg Oral q1800  . clopidogrel  75 mg Oral Q breakfast  . ferrous sulfate  325 mg Oral Q breakfast  . folic acid  1 mg Oral Daily  . furosemide  60 mg Intravenous Q12H  . levalbuterol  0.63 mg Nebulization Q6H  . LORazepam  0-4 mg Oral Q6H   Followed by  . LORazepam  0-4 mg Oral Q12H  . magnesium oxide  400 mg Oral Daily  . metoprolol  100 mg Oral BID  . multivitamin with minerals  1 tablet Oral Daily  . nicotine  14 mg Transdermal Daily  . nitroGLYCERIN  0.5 inch Topical 4 times per day  . pantoprazole  40 mg Oral Daily  . sodium chloride  3 mL Intravenous Q12H  . thiamine  100 mg Oral Daily   Or  . thiamine  100 mg Intravenous Daily  . Warfarin - Pharmacist Dosing Inpatient   Does not apply Q24H   Continuous:  YJE:HUDJSH chloride, LORazepam, LORazepam, sodium chloride  Assesment: He  has developed worsening problems with shortness of breath and clearly has hypoxic respiratory failure as well as increased PCO2. He is not tolerating BiPAP. I'm going to plan to intubate him and put him on mechanical ventilation. I discussed that with him as best I could and he does have full CODE STATUS. I discussed with his sister-in-law who has been providing his care and she agrees to go ahead. At this point I have limited information is not clear if this is from CHF COPD combination of the above. He has not had anymore seizures.   Principal Problem:   Seizure-like activity Active Problems:   ANEMIA   TOBACCO ABUSE   CHRONIC OBSTRUCTIVE PULMONARY DISEASE   MITRAL VALVE REPLACEMENT, HX OF   AORTIC VALVE REPLACEMENT, HX OF   Chronic anticoagulation   Coronary atherosclerosis of unspecified type of vessel, native or graft   Acute on chronic diastolic CHF (congestive heart failure), NYHA class 4   Hyponatremia   Atrial fibrillation status post cardioversion   Alcohol abuse    Plan: For intubation and mechanical ventilation.    LOS: 2 days   Alonza Bogus 11/28/2013, 7:54 AM

## 2013-11-28 NOTE — Progress Notes (Signed)
Pt became more confused and slightly more tachypneic overnight. New crackles heard on AM assessment while giving a bath. CO2 was close to the same as yesterday's per BMET.  RT to perform ABG. Oncoming shift informed, and will continue to monitor patient.

## 2013-11-28 NOTE — Progress Notes (Signed)
EEG Completed; Results Pending  

## 2013-11-28 NOTE — Progress Notes (Addendum)
Patient ID: [name], male   DOB: 1945-12-06, 68 y.o.   MRN: [id number]  [place] A. [name] Laughter, MD     [email or web address]          [name] is an 68 y.o. male.   Assessment/Plan: 1. Single episode shaking most consistent with tremors. The semiology seems atypical for seizures. The shaking spell seems to be due to toxic metabolic derangements which patient has. We will follow up the EEG which has been ordered. No need for seizure medications at this time. Multilevel normal. We'll also obtain all the labs assist TSH and vitamin B12 levels. 2. Moderate encephalopathy likely due to toxic metabolic derangements.  3. Alcoholism. Patient appropriately has been started on DT prophylaxis and also thiamine.   The patient is status post intubation Just shortly because of progressive respiratory failure. He did receive sedatives for the intubation.   GENERAL: Intubated HEENT: Supple. Atraumatic normocephalic.  ABDOMEN: soft But with increase girth noted.  EXTREMITIES: Erythema is noted of the feet and distal legs. There is 2-3+ pitting edema up to the knees.  BACK: Normal.  SKIN: Normal by inspection.  MENTAL STATUS: Eyes are closed but opens to painful stimuli. He does not follow commands.  CRANIAL NERVES: Pupils are equal, round and reactive to light; extra ocular movements are full - Via oculocephalic reflexes; upper and lower facial muscles are normal in strength and symmetric, there is no flattening of the nasolabial folds. Corneal and gag reflexes are intact. MOTOR: Moves both sides of pain.        Objective: Vital signs in last 24 hours: Temp:  [96.5 F (35.8 C)-98.1 F (36.7 C)] 98.1 F (36.7 C) (05/26 0400) Pulse Rate:  [66-138] 79 (05/26 0400) Resp:  [17-29] 28 (05/26 0400) BP: (108-167)/(44-118) 141/67 mmHg (05/26 0600) SpO2:  [73 %-99 %] 89 % (05/26 0400) FiO2 (%):  [50 %-60 %] 60 % (05/26 0742) Weight:  [78.3 kg (172 lb 9.9 oz)] 78.3 kg (172  lb 9.9 oz) (05/26 0500)  Intake/Output from previous day: 05/25 0701 - 05/26 0700 In: 932 [P.O.:713] Out: 1395 [Urine:1395] Intake/Output this shift:   Nutritional status: Cardiac   Lab Results: Results for orders placed during the hospital encounter of 11/26/13 (from the past 48 hour(s))  PRO B NATRIURETIC PEPTIDE     Status: Abnormal   Collection Time    11/26/13  6:26 PM      Result Value Ref Range   Pro B Natriuretic peptide (BNP) 12192.0 (*) 0 - 125 pg/mL  TROPONIN I     Status: None   Collection Time    11/26/13  6:26 PM      Result Value Ref Range   Troponin I <0.30  <0.30 ng/mL   Comment:            Due to the release kinetics of cTnI,     a negative result within the first hours     of the onset of symptoms does not rule out     myocardial infarction with certainty.     If myocardial infarction is still suspected,     repeat the test at appropriate intervals.  CBC WITH DIFFERENTIAL     Status: Abnormal   Collection Time    11/26/13  6:26 PM      Result Value Ref Range   WBC 6.7  4.0 - 10.5 K/uL   RBC 3.26 (*) 4.22 - 5.81 MIL/uL   Hemoglobin 9.2 (*) 13.0 -  17.0 g/dL   HCT 29.7 (*) 39.0 - 52.0 %   MCV 91.1  78.0 - 100.0 fL   MCH 28.2  26.0 - 34.0 pg   MCHC 31.0  30.0 - 36.0 g/dL   RDW 15.2  11.5 - 15.5 %   Platelets 312  150 - 400 K/uL   Neutrophils Relative % 79 (*) 43 - 77 %   Neutro Abs 5.3  1.7 - 7.7 K/uL   Lymphocytes Relative 6 (*) 12 - 46 %   Lymphs Abs 0.4 (*) 0.7 - 4.0 K/uL   Monocytes Relative 11  3 - 12 %   Monocytes Absolute 0.7  0.1 - 1.0 K/uL   Eosinophils Relative 4  0 - 5 %   Eosinophils Absolute 0.3  0.0 - 0.7 K/uL   Basophils Relative 0  0 - 1 %   Basophils Absolute 0.0  0.0 - 0.1 K/uL  COMPREHENSIVE METABOLIC PANEL     Status: Abnormal   Collection Time    11/26/13  6:26 PM      Result Value Ref Range   Sodium 125 (*) 137 - 147 mEq/L   Potassium 4.5  3.7 - 5.3 mEq/L   Chloride 85 (*) 96 - 112 mEq/L   CO2 23  19 - 32 mEq/L    Glucose, Bld 120 (*) 70 - 99 mg/dL   BUN 30 (*) 6 - 23 mg/dL   Creatinine, Ser 1.38 (*) 0.50 - 1.35 mg/dL   Calcium 8.3 (*) 8.4 - 10.5 mg/dL   Total Protein 7.0  6.0 - 8.3 g/dL   Albumin 3.2 (*) 3.5 - 5.2 g/dL   AST 26  0 - 37 U/L   ALT 13  0 - 53 U/L   Alkaline Phosphatase 129 (*) 39 - 117 U/L   Total Bilirubin 0.5  0.3 - 1.2 mg/dL   GFR calc non Af Amer 51 (*) >90 mL/min   GFR calc Af Amer 59 (*) >90 mL/min   Comment: (NOTE)     The eGFR has been calculated using the CKD EPI equation.     This calculation has not been validated in all clinical situations.     eGFR's persistently <90 mL/min signify possible Chronic Kidney     Disease.  PROTIME-INR     Status: Abnormal   Collection Time    11/26/13  6:26 PM      Result Value Ref Range   Prothrombin Time 33.8 (*) 11.6 - 15.2 seconds   INR 3.50 (*) 0.00 - 1.49  MAGNESIUM     Status: None   Collection Time    11/26/13  6:26 PM      Result Value Ref Range   Magnesium 1.7  1.5 - 2.5 mg/dL  MRSA PCR SCREENING     Status: None   Collection Time    11/26/13  9:40 PM      Result Value Ref Range   MRSA by PCR NEGATIVE  NEGATIVE   Comment:            The GeneXpert MRSA Assay (FDA     approved for NASAL specimens     only), is one component of a     comprehensive MRSA colonization     surveillance program. It is not     intended to diagnose MRSA     infection nor to guide or     monitor treatment for     MRSA infections.  TROPONIN I  Status: None   Collection Time    11/27/13 12:33 AM      Result Value Ref Range   Troponin I <0.30  <0.30 ng/mL   Comment:            Due to the release kinetics of cTnI,     a negative result within the first hours     of the onset of symptoms does not rule out     myocardial infarction with certainty.     If myocardial infarction is still suspected,     repeat the test at appropriate intervals.  BASIC METABOLIC PANEL     Status: Abnormal   Collection Time    11/27/13  6:26 AM       Result Value Ref Range   Sodium 127 (*) 137 - 147 mEq/L   Potassium 5.8 (*) 3.7 - 5.3 mEq/L   Comment: DELTA CHECK NOTED   Chloride 88 (*) 96 - 112 mEq/L   CO2 32  19 - 32 mEq/L   Glucose, Bld 106 (*) 70 - 99 mg/dL   BUN 30 (*) 6 - 23 mg/dL   Creatinine, Ser 1.36 (*) 0.50 - 1.35 mg/dL   Calcium 8.8  8.4 - 10.5 mg/dL   GFR calc non Af Amer 52 (*) >90 mL/min   GFR calc Af Amer 60 (*) >90 mL/min   Comment: (NOTE)     The eGFR has been calculated using the CKD EPI equation.     This calculation has not been validated in all clinical situations.     eGFR's persistently <90 mL/min signify possible Chronic Kidney     Disease.  TROPONIN I     Status: None   Collection Time    11/27/13  6:26 AM      Result Value Ref Range   Troponin I <0.30  <0.30 ng/mL   Comment:            Due to the release kinetics of cTnI,     a negative result within the first hours     of the onset of symptoms does not rule out     myocardial infarction with certainty.     If myocardial infarction is still suspected,     repeat the test at appropriate intervals.  TROPONIN I     Status: None   Collection Time    11/27/13 12:30 PM      Result Value Ref Range   Troponin I <0.30  <0.30 ng/mL   Comment:            Due to the release kinetics of cTnI,     a negative result within the first hours     of the onset of symptoms does not rule out     myocardial infarction with certainty.     If myocardial infarction is still suspected,     repeat the test at appropriate intervals.  POTASSIUM     Status: None   Collection Time    11/27/13  4:13 PM      Result Value Ref Range   Potassium 4.9  3.7 - 5.3 mEq/L  AMMONIA     Status: None   Collection Time    11/27/13  4:13 PM      Result Value Ref Range   Ammonia 35  11 - 60 umol/L  BASIC METABOLIC PANEL     Status: Abnormal   Collection Time    11/28/13  4:12 AM  Result Value Ref Range   Sodium 134 (*) 137 - 147 mEq/L   Comment: DELTA CHECK NOTED    Potassium 4.8  3.7 - 5.3 mEq/L   Chloride 92 (*) 96 - 112 mEq/L   CO2 33 (*) 19 - 32 mEq/L   Glucose, Bld 133 (*) 70 - 99 mg/dL   BUN 33 (*) 6 - 23 mg/dL   Creatinine, Ser 1.28  0.50 - 1.35 mg/dL   Calcium 8.8  8.4 - 10.5 mg/dL   GFR calc non Af Amer 56 (*) >90 mL/min   GFR calc Af Amer 65 (*) >90 mL/min   Comment: (NOTE)     The eGFR has been calculated using the CKD EPI equation.     This calculation has not been validated in all clinical situations.     eGFR's persistently <90 mL/min signify possible Chronic Kidney     Disease.  PROTIME-INR     Status: Abnormal   Collection Time    11/28/13  4:12 AM      Result Value Ref Range   Prothrombin Time 26.9 (*) 11.6 - 15.2 seconds   INR 2.59 (*) 0.00 - 1.49  BLOOD GAS, ARTERIAL     Status: Abnormal   Collection Time    11/28/13  7:26 AM      Result Value Ref Range   FIO2 70.00     pH, Arterial 7.211 (*) 7.350 - 7.450   pCO2 arterial 85.1 (*) 35.0 - 45.0 mmHg   Comment: CRITICAL RESULT CALLED TO, READ BACK BY AND VERIFIED WITH:     JESSICA CHILDRESS RN BY ROBIN POWELL RRT ON 11/28/13 AT 0726   pO2, Arterial 55.1 (*) 80.0 - 100.0 mmHg   Bicarbonate 32.9 (*) 20.0 - 24.0 mEq/L   TCO2 32  0 - 100 mmol/L   Acid-Base Excess 5.4 (*) 0.0 - 2.0 mmol/L   O2 Saturation 80.3     Patient temperature 37.0     Collection site LEFT RADIAL     Drawn by [id number]     Sample type ARTERIAL     Allens test (pass/fail) PASS  PASS    Lipid Panel No results found for this basename: CHOL, TRIG, HDL, CHOLHDL, VLDL, LDLCALC,  in the last 72 hours  Studies/Results: Dg Chest 2 View  11/26/2013   CLINICAL DATA:  Short of breath.  EXAM: CHEST  2 VIEW  COMPARISON:  10/23/2013  FINDINGS: Cardiac silhouette is mildly enlarged. There are changes from cardiac valve replacement. Mediastinum is normal in contour. No hilar masses.  There is hazy opacity in coarse reticular opacity in the lung bases. There is central interstitial thickening. No pleural effusion. No  pneumothorax.  IMPRESSION: 1. Cardiomegaly, changes from cardiac valve replacement, in hazy lung base opacity and mild interstitial thickening suggest mild congestive heart failure. Lung base infiltrate should be considered if this patient has symptoms of pneumonia.   Electronically Signed   By: Lajean Manes M.D.   On: 11/26/2013 19:05   Ct Head Wo Contrast  11/26/2013   CLINICAL DATA:  Altered mental status, pain  EXAM: CT HEAD WITHOUT CONTRAST  TECHNIQUE: Contiguous axial images were obtained from the base of the skull through the vertex without intravenous contrast.  COMPARISON:  None.  FINDINGS: No skull fracture is noted. Paranasal sinuses and mastoid air cells are unremarkable. No intracranial hemorrhage, mass effect or midline shift. Mild cerebral atrophy. No acute infarction. No mass lesion is noted on this unenhanced scan. No hydrocephalus.  IMPRESSION: No acute intracranial abnormality. No definite acute cortical infarction. Mild cerebral atrophy.   Electronically Signed   By: Lahoma Crocker M.D.   On: 11/26/2013 18:52    Medications:  Scheduled Meds: . amLODipine  10 mg Oral QHS  . atorvastatin  40 mg Oral q1800  . clopidogrel  75 mg Oral Q breakfast  . ferrous sulfate  325 mg Oral Q breakfast  . folic acid  1 mg Oral Daily  . furosemide  60 mg Intravenous Q12H  . levalbuterol  0.63 mg Nebulization Q6H  . LORazepam  0-4 mg Oral Q6H   Followed by  . LORazepam  0-4 mg Oral Q12H  . magnesium oxide  400 mg Oral Daily  . metoprolol  100 mg Oral BID  . multivitamin with minerals  1 tablet Oral Daily  . nicotine  14 mg Transdermal Daily  . nitroGLYCERIN  0.5 inch Topical 4 times per day  . pantoprazole  40 mg Oral Daily  . sodium chloride  3 mL Intravenous Q12H  . thiamine  100 mg Oral Daily   Or  . thiamine  100 mg Intravenous Daily  . warfarin  7.5 mg Oral Once  . Warfarin - Pharmacist Dosing Inpatient   Does not apply Q24H   Continuous Infusions:  PRN Meds:.sodium chloride,  LORazepam, LORazepam, sodium chloride     LOS: 2 days   Avyay Coger A. [name], M.D.  Diplomate, Tax adviser of Psychiatry and Neurology ( Neurology).

## 2013-11-28 NOTE — Progress Notes (Signed)
EEG completed.

## 2013-11-28 NOTE — Progress Notes (Signed)
ANTIBIOTIC CONSULT NOTE - INITIAL  Pharmacy Consult for Vancomycin & Zosyn Indication: pneumonia  No Known Allergies  Patient Measurements: Height: 5\' 7"  (170.2 cm) Weight: 172 lb 9.9 oz (78.3 kg) IBW/kg (Calculated) : 66.1  Vital Signs: Temp: 97.9 F (36.6 C) (05/26 1200) Temp src: Axillary (05/26 1200) BP: 133/58 mmHg (05/26 1245) Pulse Rate: 76 (05/26 1245) Intake/Output from previous day: 05/25 0701 - 05/26 0700 In: 713 [P.O.:713] Out: 1395 [Urine:1395] Intake/Output from this shift: Total I/O In: 60 [NG/GT:60] Out: -   Labs:  Recent Labs  11/26/13 1826 11/27/13 0626 11/28/13 0412  WBC 6.7  --   --   HGB 9.2*  --   --   PLT 312  --   --   CREATININE 1.38* 1.36* 1.28   Estimated Creatinine Clearance: 51.6 ml/min (by C-G formula based on Cr of 1.28). No results found for this basename: VANCOTROUGH, Corlis Leak, VANCORANDOM, [place], GENTPEAK, GENTRANDOM, TOBRATROUGH, TOBRAPEAK, TOBRARND, AMIKACINPEAK, AMIKACINTROU, AMIKACIN,  in the last 72 hours   Microbiology: Recent Results (from the past 720 hour(s))  MRSA PCR SCREENING     Status: None   Collection Time    11/26/13  9:40 PM      Result Value Ref Range Status   MRSA by PCR NEGATIVE  NEGATIVE Final   Comment:            The GeneXpert MRSA Assay (FDA     approved for NASAL specimens     only), is one component of a     comprehensive MRSA colonization     surveillance program. It is not     intended to diagnose MRSA     infection nor to guide or     monitor treatment for     MRSA infections.    Medical History: Past Medical History  Diagnosis Date  . Essential hypertension, benign   . Coronary atherosclerosis of native coronary artery     a. CABG/MVR/AVR-2003 (#25 St. Jude/#21 St. Jude); anticoagulation; b. negative stress nuclear-2005;  c. 01/2013 NSTEMI/Cath/PCI: LM nl, LAD 50p, 40-44m, D1 50ost, LCX 66m, RCA 50/52m (2.5x20 Promus DES), 50d, VG->Diag 100, VG->PDA 100, VG->OM 100, LIMA->LAD ok.   . Epistaxis     Requiring cautery & aterial ligation-09/2009  . GERD (gastroesophageal reflux disease)   . Hyperlipemia   . Abnormal LFTs     Possible cirrhosis  . Chronic anticoagulation   . Valvular heart disease     a. 2003: MVR/AVR-2003 (#25 St. Jude/#21 St. Jude);  b. 01/2013 Echo: EF 55%, Mech AVR mean grad 12, Mech MVR mean grad 6.  . Tobacco abuse     45 pack years  . Fasting hyperglycemia   . Nephrolithiasis   . Diverticulosis   . Hyponatremia   . Chronic diastolic CHF (congestive heart failure)   . Chronic renal disease   . Hemolytic anemia   . ETOH abuse   . Atrial fibrillation 01/16/2013    On coumadin DCCV 07/2013.   Marland Kitchen COPD (chronic obstructive pulmonary disease)     Medications:  Scheduled:  . amLODipine  10 mg Oral QHS  . antiseptic oral rinse  15 mL Mouth Rinse QID  . antiseptic oral rinse  15 mL Mouth Rinse QID  . atorvastatin  40 mg Oral q1800  . chlorhexidine  15 mL Mouth Rinse BID  . clopidogrel  75 mg Oral Q breakfast  . ferrous sulfate  325 mg Oral Q breakfast  . folic acid  1 mg Oral Daily  .  furosemide  60 mg Intravenous Q12H  . ipratropium      . ipratropium-albuterol  3 mL Nebulization Q4H  . magnesium oxide  400 mg Oral Daily  . methylPREDNISolone (SOLU-MEDROL) injection  60 mg Intravenous Q6H  . metoprolol  100 mg Oral BID  . multivitamin with minerals  1 tablet Oral Daily  . nicotine  14 mg Transdermal Daily  . pantoprazole (PROTONIX) IV  40 mg Intravenous Daily  . sodium chloride  3 mL Intravenous Q12H  . thiamine  100 mg Oral Daily   Or  . thiamine  100 mg Intravenous Daily  . warfarin  7.5 mg Oral Once  . Warfarin - Pharmacist Dosing Inpatient   Does not apply Q24H   Assessment: 68 yo M admitted with HF exacerbation on 5/24.  He developed hypoxia & dyspnea overnight requiring intubation.  CXR today can not exclude PNA.   WBC is normal & afebrile.  Empiric, broad-spectrum antibiotics are being initiated for possible hospital- acquired  infection.   Vancomycin 5/26>> Zosyn 5/26>>  Goal of Therapy:  Vancomycin trough level 15-20 mcg/ml  Plan:  Zosyn 3.375gm IV Q8h to be infused over 4hrs Vancomycin 750mg  IV q12h Check Vancomycin trough at steady state Monitor renal function and cx data   Lavonia Drafts Shadai Mcclane 11/28/2013,1:28 PM

## 2013-11-28 NOTE — Care Management Note (Addendum)
    Page 1 of 1   12/06/2013     11:12:56 AM CARE MANAGEMENT NOTE 12/06/2013  Patient:  SUFYAAN, PALMA   Account Number:  [id number]  Date Initiated:  11/28/2013  Documentation initiated by:  Theophilus Kinds  Subjective/Objective Assessment:   Pt admitted from home with CHF and seizures. Pt intubated today.     Action/Plan:   Will continue to follow for discharge planning needs.   Anticipated DC Date:  12/04/2013   Anticipated DC Plan:  [place]  CM consult      Choice offered to / List presented to:             Status of service:  Completed, signed off Medicare Important Message given?  YES (If response is "NO", the following Medicare IM given date fields will be blank) Date Medicare IM given:  12/06/2013 Date Additional Medicare IM given:    Discharge Disposition:  [place]  Per UR Regulation:    If discussed at Long Length of Stay Meetings, dates discussed:   12/05/2013    Comments:  12/06/13 Claretha Cooper RN BSN CM  11/28/13 Peck, RN BSN CM

## 2013-11-28 NOTE — Progress Notes (Signed)
INITIAL NUTRITION ASSESSMENT  DOCUMENTATION CODES Per approved criteria  -Not Applicable   INTERVENTION: If pt unable to transiiton to oral intake recommend: Initiate Vital HP @ 40  ml/hr via OGT.  Add 30 ml Prostat BID    Tube feeding regimen provides 1160 kcal (plus propofol which adds 496 kcal) meets (100% of energy and protein needs), 114 grams of protein, and 802 ml of H2O.    NUTRITION DIAGNOSIS: Inadequate oral intake  related to inability to eat as evidenced by  Vent status     Goal: Pt to meet >/= 90% of their estimated nutrition needs    Monitor:  Respiratory status, nutrition support, labs, I/O's and weight trends  Reason for Assessment: Pt mechanically intubated  68 y.o. male  Admitting Dx: Seizure-like activity  ASSESSMENT: Pt with respiratory failure, intubated, sedated, with OGT left mouth. Hx includes ETOH abuse, valvular heart dz.and acute on chronic diastolic heart failure. Significant wt gain 5 kg, 7% in 30 days. He is receiving diuretic.   Vent setting: MV: 9.2 L/min Temp (24hrs), Avg:97.3 F (36.3 C), Min:96.5 F (35.8 C), Max:98.1 F (36.7 C)  Propofol: 18.8 ml/hr (provides 496 kcal lipids at current rate q 24 hr)   Height: Ht Readings from Last 1 Encounters:  11/26/13 5\' 7"  (1.702 m)    Weight: Wt Readings from Last 1 Encounters:  11/28/13 172 lb 9.9 oz (78.3 kg)    Ideal Body Weight: 148# (67 kg)  % Ideal Body Weight: 117%  Wt Readings from Last 10 Encounters:  11/28/13 172 lb 9.9 oz (78.3 kg)  10/30/13 162 lb (73.483 kg)  10/23/13 160 lb (72.576 kg)  10/11/13 160 lb (72.576 kg)  09/04/13 157 lb (71.215 kg)  08/16/13 148 lb 7.7 oz (67.35 kg)  08/03/13 152 lb (68.947 kg)  07/30/13 142 lb 12.8 oz (64.774 kg)  07/30/13 142 lb 12.8 oz (64.774 kg)  06/01/13 160 lb 8 oz (72.802 kg)    Usual Body Weight: 160#  % Usual Body Weight: 108%  BMI:  Body mass index is 27.03 kg/(m^2).overweight  Estimated Nutritional Needs: Kcal:  3557 Protein: 109-112 gr Fluid: per team goal  Skin: intact  Diet Order: NPO  EDUCATION NEEDS: -Education not appropriate at this time   Intake/Output Summary (Last 24 hours) at 11/28/13 1408 Last data filed at 11/28/13 1100  Gross per 24 hour  Intake    293 ml  Output    675 ml  Net   -382 ml    Last BM: 11/24/13   Labs:   Recent Labs Lab 11/26/13 1826 11/27/13 0626 11/27/13 1613 11/28/13 0412  NA 125* 127*  --  134*  K 4.5 5.8* 4.9 4.8  CL 85* 88*  --  92*  CO2 23 32  --  33*  BUN 30* 30*  --  33*  CREATININE 1.38* 1.36*  --  1.28  CALCIUM 8.3* 8.8  --  8.8  MG 1.7  --   --   --   GLUCOSE 120* 106*  --  133*    CBG (last 3)  No results found for this basename: GLUCAP,  in the last 72 hours  Scheduled Meds: . amLODipine  10 mg Oral QHS  . antiseptic oral rinse  15 mL Mouth Rinse QID  . antiseptic oral rinse  15 mL Mouth Rinse QID  . atorvastatin  40 mg Oral q1800  . chlorhexidine  15 mL Mouth Rinse BID  . clopidogrel  75 mg Oral Q breakfast  .  ferrous sulfate  325 mg Oral Q breakfast  . folic acid  1 mg Oral Daily  . furosemide  60 mg Intravenous Q12H  . ipratropium      . ipratropium-albuterol  3 mL Nebulization Q4H  . magnesium oxide  400 mg Oral Daily  . methylPREDNISolone (SOLU-MEDROL) injection  60 mg Intravenous Q6H  . metoprolol  100 mg Oral BID  . multivitamin with minerals  1 tablet Oral Daily  . nicotine  14 mg Transdermal Daily  . pantoprazole (PROTONIX) IV  40 mg Intravenous Daily  . piperacillin-tazobactam (ZOSYN)  IV  3.375 g Intravenous Q8H  . sodium chloride  3 mL Intravenous Q12H  . thiamine  100 mg Oral Daily   Or  . thiamine  100 mg Intravenous Daily  . vancomycin  750 mg Intravenous Q12H  . warfarin  7.5 mg Oral Once  . Warfarin - Pharmacist Dosing Inpatient   Does not apply Q24H    Continuous Infusions: . propofol 40 mcg/kg/min (11/28/13 1356)    Past Medical History  Diagnosis Date  . Essential hypertension, benign    . Coronary atherosclerosis of native coronary artery     a. CABG/MVR/AVR-2003 (#25 St. Jude/#21 St. Jude); anticoagulation; b. negative stress nuclear-2005;  c. 01/2013 NSTEMI/Cath/PCI: LM nl, LAD 50p, 40-69m, D1 50ost, LCX 42m, RCA 50/42m (2.5x20 Promus DES), 50d, VG->Diag 100, VG->PDA 100, VG->OM 100, LIMA->LAD ok.  . Epistaxis     Requiring cautery & aterial ligation-09/2009  . GERD (gastroesophageal reflux disease)   . Hyperlipemia   . Abnormal LFTs     Possible cirrhosis  . Chronic anticoagulation   . Valvular heart disease     a. 2003: MVR/AVR-2003 (#25 St. Jude/#21 St. Jude);  b. 01/2013 Echo: EF 55%, Mech AVR mean grad 12, Mech MVR mean grad 6.  . Tobacco abuse     45 pack years  . Fasting hyperglycemia   . Nephrolithiasis   . Diverticulosis   . Hyponatremia   . Chronic diastolic CHF (congestive heart failure)   . Chronic renal disease   . Hemolytic anemia   . ETOH abuse   . Atrial fibrillation 01/16/2013    On coumadin DCCV 07/2013.   Marland Kitchen COPD (chronic obstructive pulmonary disease)     Past Surgical History  Procedure Laterality Date  . Endocopic sphenopalatine artery ligation & cautry    . Inguinal hernia repair      Left & right  . Coronary artery bypass graft  11/2001    [facility]  . Cardiac valve replacement  11/2001    AVR and MVR-St. Jude devices  . Cataract extraction w/phaco  01/20/2011    Procedure: CATARACT EXTRACTION PHACO AND INTRAOCULAR LENS PLACEMENT (IOC);  Surgeon: Elta Guadeloupe T. Gershon Crane;  Location: [facility];  Service: Ophthalmology;  Laterality: Right;  CDE: 8.51  . Cataract extraction w/phaco  02/03/2011    Procedure: CATARACT EXTRACTION PHACO AND INTRAOCULAR LENS PLACEMENT (IOC);  Surgeon: Elta Guadeloupe T. Gershon Crane;  Location: [facility];  Service: Ophthalmology;  Laterality: Left;  CDE:10.01  . Colonoscopy  04/05/02; 08/2011    friable anal canal hemorrhoids otherwise normal; 2 diminutive polyps excised, minimal diverticulosis noted  . Esophagogastroduodenoscopy   01/2002    Dr. Laural Golden, submucosal esophageal lesion c/w leiomyoma  . Colonoscopy  09/02/2011    Procedure: COLONOSCOPY;  Surgeon: Daneil Dolin, MD;  Location: [facility];  Service: Endoscopy;  Laterality: N/A;  8:15  . Yag laser application Right 81/19/1478    Procedure: YAG  LASER APPLICATION;  Surgeon: Elta Guadeloupe T. Gershon Crane, MD;  Location: [facility];  Service: Ophthalmology;  Laterality: Right;    Colman Cater MS,RD,CSG,LDN Office: [phone] Pager: [phone]

## 2013-11-28 NOTE — Progress Notes (Signed)
Propofol drip increased to 96mcg/kg/min; Pt moving in bed and gumming ETT O2 sats 88-89%; Resp called with sats dropping; patient suctioned; will continue to monitor and document any changes

## 2013-11-28 NOTE — Procedures (Signed)
Intubation Procedure Note [name] [id number] 1945/09/22  Procedure: Intubation Indications: Airway protection and maintenance  Procedure Details Consent: Unable to obtain consent because of altered level of consciousness. Time Out: Verified patient identification, verified procedure, site/side was marked, verified correct patient position, special equipment/implants available, medications/allergies/relevent history reviewed, required imaging and test results available.  Performed  Maximum sterile technique was used including antiseptics, gloves and hand hygiene.  MAC and 4    Evaluation Hemodynamic Status: BP stable throughout; O2 sats: stable throughout Patient's Current Condition: stable Complications: No apparent complications Patient did tolerate procedure well. Chest X-ray ordered to verify placement.  CXR: pending.   Elsie Stain 11/28/2013

## 2013-11-28 NOTE — Progress Notes (Signed)
Pt's SATS have been in the 80"s (85-89) ABG was done on VT 500, PEEP 5, RR 12 and FIO2 100%. The Pt sats were 85 %. RT increased PEEP to 10 per Dr. Luan Pulling. RT has done 2 recruitment that helps his sats for about and hour. RT will get another ABG at 1300

## 2013-11-28 NOTE — Progress Notes (Signed)
He was intubated on the second attempt after receiving etomidate 14 mg and succinylcholine 120 mg. He has good bilateral breath sounds. He has been very agitated and almost combative so I don't think he's going to be a good candidate for phase I sedation. He will have Foley catheter placed. He will have an NG tube. Chest x-ray is pending

## 2013-11-28 NOTE — Progress Notes (Addendum)
ANTICOAGULATION CONSULT NOTE  Pharmacy Consult for Coumadin  Indication: Afib + St Jude's AVR/MVR   No Known Allergies  Patient Measurements: Height: 5\' 7"  (170.2 cm) Weight: 172 lb 9.9 oz (78.3 kg) IBW/kg (Calculated) : 66.1  Vital Signs: Temp: 98.1 F (36.7 C) (05/26 0400) Temp src: Axillary (05/26 0400) BP: 141/67 mmHg (05/26 0600) Pulse Rate: 79 (05/26 0400)  Labs:  Recent Labs  11/26/13 1826 11/27/13 0033 11/27/13 0626 11/27/13 1230 11/28/13 0412  HGB 9.2*  --   --   --   --   HCT 29.7*  --   --   --   --   PLT 312  --   --   --   --   LABPROT 33.8*  --   --   --  26.9*  INR 3.50*  --   --   --  2.59*  CREATININE 1.38*  --  1.36*  --  1.28  TROPONINI <0.30 <0.30 <0.30 <0.30  --    Estimated Creatinine Clearance: 51.6 ml/min (by C-G formula based on Cr of 1.28).  Medical History: Past Medical History  Diagnosis Date  . Essential hypertension, benign   . Coronary atherosclerosis of native coronary artery     a. CABG/MVR/AVR-2003 (#25 St. Jude/#21 St. Jude); anticoagulation; b. negative stress nuclear-2005;  c. 01/2013 NSTEMI/Cath/PCI: LM nl, LAD 50p, 40-69m, D1 50ost, LCX 74m, RCA 50/67m (2.5x20 Promus DES), 50d, VG->Diag 100, VG->PDA 100, VG->OM 100, LIMA->LAD ok.  . Epistaxis     requiring cautery & aterial ligation-09/2009  . GERD (gastroesophageal reflux disease)   . Hyperlipemia   . Abnormal LFTs     Possible cirrhosis  . Chronic anticoagulation   . Valvular heart disease     a. 2003: MVR/AVR-2003 (#25 St. Jude/#21 St. Jude);  b. 01/2013 Echo: EF 55%, Mech AVR mean grad 12, Mech MVR mean grad 6.  . Tobacco abuse     45 pack years  . Fasting hyperglycemia   . Nephrolithiasis   . Diverticulosis   . Hyponatremia   . Chronic diastolic CHF (congestive heart failure)   . Chronic renal disease   . Hemolytic anemia   . ETOH abuse   . A-fib 01/16/2013    a. On coumadin DCCV 07/2013.   Marland Kitchen COPD (chronic obstructive pulmonary disease)   . Hx of echocardiogram      a. Echo (07/2013):  EF 50-55%, normal MVR/AVR, mod LAE.   Medications:  Prescriptions prior to admission  Medication Sig Dispense Refill  . amLODipine (NORVASC) 10 MG tablet Take 10 mg by mouth at bedtime.      Marland Kitchen atorvastatin (LIPITOR) 40 MG tablet Take 40 mg by mouth daily.      . clopidogrel (PLAVIX) 75 MG tablet Take 1 tablet (75 mg total) by mouth daily with breakfast.  30 tablet  6  . ferrous sulfate 325 (65 FE) MG EC tablet Take 325 mg by mouth daily with breakfast.      . folic acid (FOLVITE) 1 MG tablet Take 1 tablet (1 mg total) by mouth daily.  30 tablet  4  . levalbuterol (XOPENEX) 0.63 MG/3ML nebulizer solution Take 3 mLs (0.63 mg total) by nebulization every 6 (six) hours as needed for wheezing or shortness of breath.  3 mL  4  . magnesium oxide (MAG-OX) 400 (241.3 MG) MG tablet Take 1 tablet (400 mg total) by mouth daily.  30 tablet  6  . metoprolol (LOPRESSOR) 100 MG tablet Take 1 tablet (100  mg total) by mouth 2 (two) times daily.  60 tablet  6  . Multiple Vitamin (MULTIVITAMIN WITH MINERALS) TABS Take 1 tablet by mouth daily.      . pantoprazole (PROTONIX) 40 MG tablet Take 1 tablet (40 mg total) by mouth daily.  30 tablet  0  . potassium chloride SA (K-DUR,KLOR-CON) 20 MEQ tablet Take 20 mEq by mouth daily.      Marland Kitchen thiamine 100 MG tablet Take 1 tablet (100 mg total) by mouth daily.  30 tablet  4  . torsemide (DEMADEX) 20 MG tablet Take 2 tablets (40 mg total) by mouth 2 (two) times daily.  60 tablet  6  . warfarin (COUMADIN) 5 MG tablet Take 1 1/2 tablets (7.5 mg) on Mondays and  Fridays  take 1 tablet (5 mg) on Sunday, Tuesday, Wednesday Thursday and Saturday      . nitroGLYCERIN (NITROSTAT) 0.4 MG SL tablet Place 1 tablet (0.4 mg total) under the tongue every 5 (five) minutes x 3 doses as needed for chest pain.  25 tablet  3   Assessment: 68yo male on chronic Coumadin PTA for h/o mitral and aortic valve replacement + AFib.  Recorded home dose (listed above) varies slightly  from outpatient anticoag flow-sheet which states 5mg  daily except 7.5mg  on Mon.  Will need to clarify with patient once able.    INR was therapeutic on admission & remains in goal range today. No bleeding noted.    Goal of Therapy:  INR 2.5 - 3.5    Plan:   Coumadin 7.5mg  po today x 1   INR daily  Clarify outpatient Coumadin regimen with patient  Lavonia Drafts Marlin Brys 11/28/2013,7:54 AM

## 2013-11-28 NOTE — Progress Notes (Signed)
Pt ventilated, but still has PO medication from pre-ventilation. CXR checked before using OGT, but CXR says tube is off of the film, so placement was not confirmed. MD on call notified. Given pt's current HR and BP, on call MD held PO medication and will re-evaluate OGT placement on AM CXR.

## 2013-11-29 ENCOUNTER — Inpatient Hospital Stay (HOSPITAL_COMMUNITY): Payer: Medicare Other

## 2013-11-29 DIAGNOSIS — I4891 Unspecified atrial fibrillation: Secondary | ICD-10-CM

## 2013-11-29 LAB — PROTIME-INR
INR: 3.55 — ABNORMAL HIGH (ref 0.00–1.49)
Prothrombin Time: 34.2 seconds — ABNORMAL HIGH (ref 11.6–15.2)

## 2013-11-29 LAB — BASIC METABOLIC PANEL
BUN: 32 mg/dL — AB (ref 6–23)
CO2: 34 mEq/L — ABNORMAL HIGH (ref 19–32)
Calcium: 9 mg/dL (ref 8.4–10.5)
Chloride: 92 mEq/L — ABNORMAL LOW (ref 96–112)
Creatinine, Ser: 1.21 mg/dL (ref 0.50–1.35)
GFR calc Af Amer: 69 mL/min — ABNORMAL LOW (ref >90)
GFR calc non Af Amer: 60 mL/min — ABNORMAL LOW (ref >90)
GLUCOSE: 177 mg/dL — AB (ref 70–99)
POTASSIUM: 4 meq/L (ref 3.7–5.3)
Sodium: 137 mEq/L (ref 137–147)

## 2013-11-29 MED ORDER — WARFARIN SODIUM 2 MG PO TABS
2.0000 mg | ORAL_TABLET | Freq: Once | ORAL | Status: AC
  Administered 2013-11-29: 2 mg via ORAL
  Filled 2013-11-29: qty 1

## 2013-11-29 MED ORDER — FUROSEMIDE 10 MG/ML IJ SOLN
80.0000 mg | Freq: Two times a day (BID) | INTRAMUSCULAR | Status: DC
  Administered 2013-11-29 – 2013-12-01 (×4): 80 mg via INTRAVENOUS
  Filled 2013-11-29 (×4): qty 8

## 2013-11-29 NOTE — Procedures (Signed)
[place] A. Merlene Laughter, MD     [email or web address]           HISTORY: This is a 68 year old man who presents with altered mental status and shaking suspicious for seizure activity.  MEDICATIONS: Scheduled Meds: . amLODipine  10 mg Oral QHS  . antiseptic oral rinse  15 mL Mouth Rinse QID  . antiseptic oral rinse  15 mL Mouth Rinse QID  . atorvastatin  40 mg Oral q1800  . chlorhexidine  15 mL Mouth Rinse BID  . clopidogrel  75 mg Oral Q breakfast  . ferrous sulfate  325 mg Oral Q breakfast  . folic acid  1 mg Oral Daily  . furosemide  80 mg Intravenous Q12H  . ipratropium-albuterol  3 mL Nebulization Q4H  . magnesium oxide  400 mg Oral Daily  . methylPREDNISolone (SOLU-MEDROL) injection  60 mg Intravenous Q6H  . metoprolol  100 mg Oral BID  . multivitamin with minerals  1 tablet Oral Daily  . nicotine  14 mg Transdermal Daily  . pantoprazole (PROTONIX) IV  40 mg Intravenous Daily  . piperacillin-tazobactam (ZOSYN)  IV  3.375 g Intravenous Q8H  . sodium chloride  3 mL Intravenous Q12H  . thiamine  100 mg Oral Daily   Or  . thiamine  100 mg Intravenous Daily  . vancomycin  750 mg Intravenous Q12H  . warfarin  2 mg Oral Once  . Warfarin - Pharmacist Dosing Inpatient   Does not apply Q24H   Continuous Infusions: . propofol 35 mcg/kg/min (11/29/13 1138)   PRN Meds:.sodium chloride, albuterol, midazolam, sodium chloride  Prior to Admission medications   Medication Sig Start Date End Date Taking? Authorizing Provider  amLODipine (NORVASC) 10 MG tablet Take 10 mg by mouth at bedtime.   Yes Historical Provider, MD  atorvastatin (LIPITOR) 40 MG tablet Take 40 mg by mouth daily.   Yes Historical Provider, MD  clopidogrel (PLAVIX) 75 MG tablet Take 1 tablet (75 mg total) by mouth daily with breakfast. 07/03/13  Yes Satira Sark, MD  ferrous sulfate 325 (65 FE) MG EC tablet Take 325 mg by mouth daily with breakfast.   Yes Historical Provider, MD  folic acid  (FOLVITE) 1 MG tablet Take 1 tablet (1 mg total) by mouth daily. 01/22/13  Yes Brooke O Edmisten, PA-C  levalbuterol (XOPENEX) 0.63 MG/3ML nebulizer solution Take 3 mLs (0.63 mg total) by nebulization every 6 (six) hours as needed for wheezing or shortness of breath. 01/22/13  Yes Brooke O Edmisten, PA-C  magnesium oxide (MAG-OX) 400 (241.3 MG) MG tablet Take 1 tablet (400 mg total) by mouth daily. 09/12/13  Yes Lendon Colonel, NP  metoprolol (LOPRESSOR) 100 MG tablet Take 1 tablet (100 mg total) by mouth 2 (two) times daily. 07/03/13  Yes Satira Sark, MD  Multiple Vitamin (MULTIVITAMIN WITH MINERALS) TABS Take 1 tablet by mouth daily. 01/22/13  Yes Brooke O Edmisten, PA-C  pantoprazole (PROTONIX) 40 MG tablet Take 1 tablet (40 mg total) by mouth daily. 11/06/13  Yes Lendon Colonel, NP  potassium chloride SA (K-DUR,KLOR-CON) 20 MEQ tablet Take 20 mEq by mouth daily.   Yes Historical Provider, MD  thiamine 100 MG tablet Take 1 tablet (100 mg total) by mouth daily. 01/22/13  Yes Brooke O Edmisten, PA-C  torsemide (DEMADEX) 20 MG tablet Take 2 tablets (40 mg total) by mouth 2 (two) times daily. 09/12/13  Yes Lendon Colonel, NP  warfarin (COUMADIN) 5 MG tablet Take 1 1/2  tablets (7.5 mg) on Mondays and  Fridays  take 1 tablet (5 mg) on Sunday, Tuesday, Wednesday Thursday and Saturday 07/31/13  Yes Lendon Colonel, NP  nitroGLYCERIN (NITROSTAT) 0.4 MG SL tablet Place 1 tablet (0.4 mg total) under the tongue every 5 (five) minutes x 3 doses as needed for chest pain. 01/31/13   Rogelia Mire, NP      ANALYSIS: A 16 channel recording using standard 10 20 measurements is conducted for approximately 20 minutes. The background activity shows mostly generalized 3-4 Hz delta slowing for the most part. Occasionally the background activity is as high as 8 Hz. There is Excessive amount of spinning observed. K complexes are also seen consistent with stage II non-REM sleep. Photic stimulation and  hyperventilation were not carried out. There are no focal or lateralized slowing. The patient did have some movements noted throughout the recording but no electrographic correlates are observed. There are no epileptiform activity observed.    IMPRESSION: 1. This recording of Awake and sleep states shows a moderate generalized slowing. There is also excessive spindles. Both of these effects are likely due to medications particularly propofol. There are no epileptiform discharges observed.      Jasenia Weilbacher A. Merlene Laughter, M.D.  Diplomate, Tax adviser of Psychiatry and Neurology ( Neurology).

## 2013-11-29 NOTE — Progress Notes (Signed)
Patient ID: [name], male   DOB: 01-Sep-1945, 68 y.o.   MRN: [id number]  [place] A. Merlene Laughter, MD     [email or web address]          [name] is an 68 y.o. male.   Assessment/Plan: 1. Recurrent shaking most consistent with tremors. The semiology seems atypical for seizures. The shaking spell seems to be due to toxic metabolic derangements which patient has. We will follow up the EEG which has been ordered. No need for seizure medications at this time.   2. Moderate encephalopathy likely due to toxic metabolic derangements.   3. Alcoholism. Patient appropriately has been started on DT prophylaxis and also thiamine.   The nurses reports that the patient is having shaking of the upper extremities bilaterally overnight and this morning. The spells are observed and seen consistent with tremors. Patient is intubated on propofol.   GENERAL: Intubated HEENT: Supple. Atraumatic normocephalic.  ABDOMEN: soft But with increase girth noted.  EXTREMITIES: Erythema is noted of the feet and distal legs. There is 2-3+ pitting edema up to the knees.  BACK: Normal.  SKIN: Normal by inspection.  MENTAL STATUS: Eyes are closed but opens to painful stimuli. He does not follow commands.  CRANIAL NERVES: Pupils are equal, round and reactive to light; extra ocular movements are full - Via oculocephalic reflexes; upper and lower facial muscles are normal in strength and symmetric, there is no flattening of the nasolabial folds. Corneal and gag reflexes are intact. MOTOR: Moves both sides of pain- Spontaneously and purposefully. He is noted to have shaking of the upper extremities bilaterally with associated with mild increased tone. The characteristic is moderate amplitude moderate frequency.       Objective: Vital signs in last 24 hours: Temp:  [97.9 F (36.6 C)-100.7 F (38.2 C)] 100.7 F (38.2 C) (05/27 0754) Pulse Rate:  [72-95] 95 (05/27 0500) Resp:  [11-27] 15  (05/27 0500) BP: (103-152)/(42-101) 149/77 mmHg (05/27 0500) SpO2:  [82 %-99 %] 92 % (05/27 0652) FiO2 (%):  [60 %-100 %] 60 % (05/27 0654) Weight:  [73.9 kg (162 lb 14.7 oz)] 73.9 kg (162 lb 14.7 oz) (05/27 0500)  Intake/Output from previous day: 05/26 0701 - 05/27 0700 In: 310 [NG/GT:110; IV Piggyback:200] Out: 2550 [Urine:2550] Intake/Output this shift:   Nutritional status: NPO   Lab Results: Results for orders placed during the hospital encounter of 11/26/13 (from the past 48 hour(s))  TROPONIN I     Status: None   Collection Time    11/27/13 12:30 PM      Result Value Ref Range   Troponin I <0.30  <0.30 ng/mL   Comment:            Due to the release kinetics of cTnI,     a negative result within the first hours     of the onset of symptoms does not rule out     myocardial infarction with certainty.     If myocardial infarction is still suspected,     repeat the test at appropriate intervals.  POTASSIUM     Status: None   Collection Time    11/27/13  4:13 PM      Result Value Ref Range   Potassium 4.9  3.7 - 5.3 mEq/L  AMMONIA     Status: None   Collection Time    11/27/13  4:13 PM      Result Value Ref Range   Ammonia 35  11 -  60 umol/L  BASIC METABOLIC PANEL     Status: Abnormal   Collection Time    11/28/13  4:12 AM      Result Value Ref Range   Sodium 134 (*) 137 - 147 mEq/L   Comment: DELTA CHECK NOTED   Potassium 4.8  3.7 - 5.3 mEq/L   Chloride 92 (*) 96 - 112 mEq/L   CO2 33 (*) 19 - 32 mEq/L   Glucose, Bld 133 (*) 70 - 99 mg/dL   BUN 33 (*) 6 - 23 mg/dL   Creatinine, Ser 1.28  0.50 - 1.35 mg/dL   Calcium 8.8  8.4 - 10.5 mg/dL   GFR calc non Af Amer 56 (*) >90 mL/min   GFR calc Af Amer 65 (*) >90 mL/min   Comment: (NOTE)     The eGFR has been calculated using the CKD EPI equation.     This calculation has not been validated in all clinical situations.     eGFR's persistently <90 mL/min signify possible Chronic Kidney     Disease.  PROTIME-INR      Status: Abnormal   Collection Time    11/28/13  4:12 AM      Result Value Ref Range   Prothrombin Time 26.9 (*) 11.6 - 15.2 seconds   INR 2.59 (*) 0.00 - 1.49  BLOOD GAS, ARTERIAL     Status: Abnormal   Collection Time    11/28/13  7:26 AM      Result Value Ref Range   FIO2 70.00     pH, Arterial 7.211 (*) 7.350 - 7.450   pCO2 arterial 85.1 (*) 35.0 - 45.0 mmHg   Comment: CRITICAL RESULT CALLED TO, READ BACK BY AND VERIFIED WITH:     JESSICA CHILDRESS RN BY ROBIN POWELL RRT ON 11/28/13 AT 0726   pO2, Arterial 55.1 (*) 80.0 - 100.0 mmHg   Bicarbonate 32.9 (*) 20.0 - 24.0 mEq/L   TCO2 32  0 - 100 mmol/L   Acid-Base Excess 5.4 (*) 0.0 - 2.0 mmol/L   O2 Saturation 80.3     Patient temperature 37.0     Collection site LEFT RADIAL     Drawn by [id number]     Sample type ARTERIAL     Allens test (pass/fail) PASS  PASS  TRIGLYCERIDES     Status: None   Collection Time    11/28/13  8:41 AM      Result Value Ref Range   Triglycerides 63  <150 mg/dL  VITAMIN B12     Status: None   Collection Time    11/28/13  8:41 AM      Result Value Ref Range   Vitamin B-12 577  211 - 911 pg/mL   Comment: Performed at [place]     Status: None   Collection Time    11/28/13  8:41 AM      Result Value Ref Range   Homocysteine 6.5  4.0 - 15.4 umol/L   Comment: Performed at [facility]  TSH     Status: None   Collection Time    11/28/13  8:41 AM      Result Value Ref Range   TSH 0.684  0.350 - 4.500 uIU/mL   Comment: Please note change in reference range.     Performed at [place], ARTERIAL     Status: Abnormal   Collection Time    11/28/13  9:55 AM  Result Value Ref Range   FIO2 100.00     Delivery systems VENTILATOR     Mode PRESSURE REGULATED VOLUME CONTROL     VT 550     Rate 12     Peep/cpap 5.0     pH, Arterial 7.289 (*) 7.350 - 7.450   pCO2 arterial 69.9 (*) 35.0 - 45.0 mmHg   Comment: CRITICAL RESULT CALLED TO, READ BACK BY  AND VERIFIED WITH:     JESSICA CHILDRESS RN BY ROBIN POWELL RRT AT 0955 ON 11/28/13   pO2, Arterial 57.0 (*) 80.0 - 100.0 mmHg   Bicarbonate 32.4 (*) 20.0 - 24.0 mEq/L   TCO2 31.3  0 - 100 mmol/L   Acid-Base Excess 6.1 (*) 0.0 - 2.0 mmol/L   O2 Saturation 85.3     Patient temperature 37.0     Collection site LEFT RADIAL     Drawn by [id number]     Sample type ARTERIAL     Allens test (pass/fail) PASS  PASS  BASIC METABOLIC PANEL     Status: Abnormal   Collection Time    11/29/13  4:13 AM      Result Value Ref Range   Sodium 137  137 - 147 mEq/L   Potassium 4.0  3.7 - 5.3 mEq/L   Chloride 92 (*) 96 - 112 mEq/L   CO2 34 (*) 19 - 32 mEq/L   Glucose, Bld 177 (*) 70 - 99 mg/dL   BUN 32 (*) 6 - 23 mg/dL   Creatinine, Ser 1.21  0.50 - 1.35 mg/dL   Calcium 9.0  8.4 - 10.5 mg/dL   GFR calc non Af Amer 60 (*) >90 mL/min   GFR calc Af Amer 69 (*) >90 mL/min   Comment: (NOTE)     The eGFR has been calculated using the CKD EPI equation.     This calculation has not been validated in all clinical situations.     eGFR's persistently <90 mL/min signify possible Chronic Kidney     Disease.  PROTIME-INR     Status: Abnormal   Collection Time    11/29/13  4:13 AM      Result Value Ref Range   Prothrombin Time 34.2 (*) 11.6 - 15.2 seconds   INR 3.55 (*) 0.00 - 1.49    Lipid Panel  Recent Labs  11/28/13 0841  TRIG 63    Studies/Results: Portable Chest Xray  11/28/2013   CLINICAL DATA:  Endotracheal tube placement.  EXAM: PORTABLE CHEST - 1 VIEW  COMPARISON:  Two-view chest 11/26/2013.  FINDINGS: The heart is enlarged. 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 The tip of the endotracheal tube is 4.8 cm above the carina, within normal limits. An NG tube courses off the inferior border of the film. Moderate bilateral edema is now present. Bilateral pleural effusions are present, right greater than left. Bibasilar airspace disease likely reflects atelectasis.  Infection is not excluded.  IMPRESSION: 1. Congestive heart failure with bilateral edema, pleural effusions, and airspace disease, right greater than left. 2. The airspace disease likely reflects atelectasis. Infection is not excluded. 3. Satisfactory positioning of the endotracheal tube 4.8 cm above the carina.   Electronically Signed   By: Lawrence Santiago M.D.   On: 11/28/2013 09:00    Medications:  Scheduled Meds: . amLODipine  10 mg Oral QHS  . antiseptic oral rinse  15 mL Mouth Rinse QID  . antiseptic oral rinse  15 mL Mouth Rinse  QID  . atorvastatin  40 mg Oral q1800  . chlorhexidine  15 mL Mouth Rinse BID  . clopidogrel  75 mg Oral Q breakfast  . ferrous sulfate  325 mg Oral Q breakfast  . folic acid  1 mg Oral Daily  . furosemide  60 mg Intravenous Q12H  . ipratropium-albuterol  3 mL Nebulization Q4H  . magnesium oxide  400 mg Oral Daily  . methylPREDNISolone (SOLU-MEDROL) injection  60 mg Intravenous Q6H  . metoprolol  100 mg Oral BID  . multivitamin with minerals  1 tablet Oral Daily  . nicotine  14 mg Transdermal Daily  . pantoprazole (PROTONIX) IV  40 mg Intravenous Daily  . piperacillin-tazobactam (ZOSYN)  IV  3.375 g Intravenous Q8H  . sodium chloride  3 mL Intravenous Q12H  . thiamine  100 mg Oral Daily   Or  . thiamine  100 mg Intravenous Daily  . vancomycin  750 mg Intravenous Q12H  . Warfarin - Pharmacist Dosing Inpatient   Does not apply Q24H   Continuous Infusions: . propofol 40 mcg/kg/min (11/29/13 0604)   PRN Meds:.sodium chloride, albuterol, midazolam, sodium chloride     LOS: 3 days   Laquana Villari A. Merlene Laughter, M.D.  Diplomate, Tax adviser of Psychiatry and Neurology ( Neurology).

## 2013-11-29 NOTE — Progress Notes (Signed)
ANTICOAGULATION CONSULT NOTE  Pharmacy Consult for Coumadin  Indication: Afib + St Jude's AVR/MVR   No Known Allergies  Patient Measurements: Height: 5\' 7"  (170.2 cm) Weight: 162 lb 14.7 oz (73.9 kg) IBW/kg (Calculated) : 66.1  Vital Signs: Temp: 100.7 F (38.2 C) (05/27 0754) Temp src: Axillary (05/27 0754) BP: 158/102 mmHg (05/27 0917) Pulse Rate: 109 (05/27 0917)  Labs:  Recent Labs  11/26/13 1826 11/27/13 0033 11/27/13 0626 11/27/13 1230 11/28/13 0412 11/29/13 0413  HGB 9.2*  --   --   --   --   --   HCT 29.7*  --   --   --   --   --   PLT 312  --   --   --   --   --   LABPROT 33.8*  --   --   --  26.9* 34.2*  INR 3.50*  --   --   --  2.59* 3.55*  CREATININE 1.38*  --  1.36*  --  1.28 1.21  TROPONINI <0.30 <0.30 <0.30 <0.30  --   --    Estimated Creatinine Clearance: 54.6 ml/min (by C-G formula based on Cr of 1.21).  Medical History: Past Medical History  Diagnosis Date  . Essential hypertension, benign   . Coronary atherosclerosis of native coronary artery     a. CABG/MVR/AVR-2003 (#25 St. Jude/#21 St. Jude); anticoagulation; b. negative stress nuclear-2005;  c. 01/2013 NSTEMI/Cath/PCI: LM nl, LAD 50p, 40-27m, D1 50ost, LCX 103m, RCA 50/45m (2.5x20 Promus DES), 50d, VG->Diag 100, VG->PDA 100, VG->OM 100, LIMA->LAD ok.  . Epistaxis     Requiring cautery & aterial ligation-09/2009  . GERD (gastroesophageal reflux disease)   . Hyperlipemia   . Abnormal LFTs     Possible cirrhosis  . Chronic anticoagulation   . Valvular heart disease     a. 2003: MVR/AVR-2003 (#25 St. Jude/#21 St. Jude);  b. 01/2013 Echo: EF 55%, Mech AVR mean grad 12, Mech MVR mean grad 6.  . Tobacco abuse     45 pack years  . Fasting hyperglycemia   . Nephrolithiasis   . Diverticulosis   . Hyponatremia   . Chronic diastolic CHF (congestive heart failure)   . Chronic renal disease   . Hemolytic anemia   . ETOH abuse   . Atrial fibrillation 01/16/2013    On coumadin DCCV 07/2013.   Marland Kitchen COPD  (chronic obstructive pulmonary disease)    Medications:  Prescriptions prior to admission  Medication Sig Dispense Refill  . amLODipine (NORVASC) 10 MG tablet Take 10 mg by mouth at bedtime.      Marland Kitchen atorvastatin (LIPITOR) 40 MG tablet Take 40 mg by mouth daily.      . clopidogrel (PLAVIX) 75 MG tablet Take 1 tablet (75 mg total) by mouth daily with breakfast.  30 tablet  6  . ferrous sulfate 325 (65 FE) MG EC tablet Take 325 mg by mouth daily with breakfast.      . folic acid (FOLVITE) 1 MG tablet Take 1 tablet (1 mg total) by mouth daily.  30 tablet  4  . levalbuterol (XOPENEX) 0.63 MG/3ML nebulizer solution Take 3 mLs (0.63 mg total) by nebulization every 6 (six) hours as needed for wheezing or shortness of breath.  3 mL  4  . magnesium oxide (MAG-OX) 400 (241.3 MG) MG tablet Take 1 tablet (400 mg total) by mouth daily.  30 tablet  6  . metoprolol (LOPRESSOR) 100 MG tablet Take 1 tablet (100 mg total)  by mouth 2 (two) times daily.  60 tablet  6  . Multiple Vitamin (MULTIVITAMIN WITH MINERALS) TABS Take 1 tablet by mouth daily.      . pantoprazole (PROTONIX) 40 MG tablet Take 1 tablet (40 mg total) by mouth daily.  30 tablet  0  . potassium chloride SA (K-DUR,KLOR-CON) 20 MEQ tablet Take 20 mEq by mouth daily.      Marland Kitchen thiamine 100 MG tablet Take 1 tablet (100 mg total) by mouth daily.  30 tablet  4  . torsemide (DEMADEX) 20 MG tablet Take 2 tablets (40 mg total) by mouth 2 (two) times daily.  60 tablet  6  . warfarin (COUMADIN) 5 MG tablet Take 1 1/2 tablets (7.5 mg) on Mondays and  Fridays  take 1 tablet (5 mg) on Sunday, Tuesday, Wednesday Thursday and Saturday      . nitroGLYCERIN (NITROSTAT) 0.4 MG SL tablet Place 1 tablet (0.4 mg total) under the tongue every 5 (five) minutes x 3 doses as needed for chest pain.  25 tablet  3   Assessment: 68yo male on chronic Coumadin PTA for h/o mitral and aortic valve replacement + AFib.  Recorded home dose (listed above) varies slightly from outpatient  anticoag flow-sheet which states 5mg  daily except 7.5mg  on Mon.  Will need to clarify with patient once able.    INR was therapeutic on admission & remains in goal range today but is on high end of range.  Pt remains sedated on ventilator.   No bleeding noted.    Goal of Therapy:  INR 2.5 - 3.5    Plan:   Coumadin 2mg  po today x 1 (dose reduction to discourage overshoot)    INR daily  Clarify outpatient Coumadin regimen with patient when able  [employer] 11/29/2013,10:49 AM

## 2013-11-29 NOTE — Progress Notes (Signed)
Subjective: He had to be intubated and started on mechanical ventilation yesterday. He is better. His weight has come down he's not on as much oxygen and his INO is down about 3 L.  Objective: Vital signs in last 24 hours: Temp:  [97.9 F (36.6 C)-100.7 F (38.2 C)] 100.7 F (38.2 C) (05/27 0754) Pulse Rate:  [73-95] 95 (05/27 0500) Resp:  [11-22] 15 (05/27 0500) BP: (112-152)/(43-101) 149/77 mmHg (05/27 0500) SpO2:  [82 %-99 %] 92 % (05/27 0652) FiO2 (%):  [60 %-100 %] 60 % (05/27 0654) Weight:  [73.9 kg (162 lb 14.7 oz)] 73.9 kg (162 lb 14.7 oz) (05/27 0500) Weight change: -4.4 kg (-9 lb 11.2 oz) Last BM Date: 11/24/13  Intake/Output from previous day: 05/26 0701 - 05/27 0700 In: 310 [NG/GT:110; IV Piggyback:200] Out: 2550 [Urine:2550]  PHYSICAL EXAM General appearance: Intubated sedated on mechanical ventilation Resp: rhonchi bilaterally Cardio: regular rate and rhythm, S1, S2 normal, no murmur, click, rub or gallop GI: soft, non-tender; bowel sounds normal; no masses,  no organomegaly Extremities: He has some conjunctival edema  Lab Results:    Basic Metabolic Panel:  Recent Labs  11/26/13 1826  11/28/13 0412 11/29/13 0413  NA 125*  < > 134* 137  K 4.5  < > 4.8 4.0  CL 85*  < > 92* 92*  CO2 23  < > 33* 34*  GLUCOSE 120*  < > 133* 177*  BUN 30*  < > 33* 32*  CREATININE 1.38*  < > 1.28 1.21  CALCIUM 8.3*  < > 8.8 9.0  MG 1.7  --   --   --   < > = values in this interval not displayed. Liver Function Tests:  Recent Labs  11/26/13 1826  AST 26  ALT 13  ALKPHOS 129*  BILITOT 0.5  PROT 7.0  ALBUMIN 3.2*   No results found for this basename: LIPASE, AMYLASE,  in the last 72 hours  Recent Labs  11/27/13 1613  AMMONIA 35   CBC:  Recent Labs  11/26/13 1826  WBC 6.7  NEUTROABS 5.3  HGB 9.2*  HCT 29.7*  MCV 91.1  PLT 312   Cardiac Enzymes:  Recent Labs  11/27/13 0033 11/27/13 0626 11/27/13 1230  TROPONINI <0.30 <0.30 <0.30    BNP:  Recent Labs  11/26/13 1826  PROBNP 12192.0*   D-Dimer: No results found for this basename: DDIMER,  in the last 72 hours CBG: No results found for this basename: GLUCAP,  in the last 72 hours Hemoglobin A1C: No results found for this basename: HGBA1C,  in the last 72 hours Fasting Lipid Panel:  Recent Labs  11/28/13 0841  TRIG 63   Thyroid Function Tests:  Recent Labs  11/28/13 0841  TSH 0.684   Anemia Panel:  Recent Labs  11/28/13 0841  VITAMINB12 577   Coagulation:  Recent Labs  11/28/13 0412 11/29/13 0413  LABPROT 26.9* 34.2*  INR 2.59* 3.55*   Urine Drug Screen: Drugs of Abuse  No results found for this basename: labopia, cocainscrnur, labbenz, amphetmu, thcu, labbarb    Alcohol Level: No results found for this basename: ETH,  in the last 72 hours Urinalysis: No results found for this basename: COLORURINE, APPERANCEUR, LABSPEC, PHURINE, GLUCOSEU, HGBUR, BILIRUBINUR, KETONESUR, PROTEINUR, UROBILINOGEN, NITRITE, LEUKOCYTESUR,  in the last 72 hours Misc. Labs:  ABGS  Recent Labs  11/28/13 0955  PHART 7.289*  PO2ART 57.0*  TCO2 31.3  HCO3 32.4*   CULTURES Recent Results (from the past 240  hour(s))  MRSA PCR SCREENING     Status: None   Collection Time    11/26/13  9:40 PM      Result Value Ref Range Status   MRSA by PCR NEGATIVE  NEGATIVE Final   Comment:            The GeneXpert MRSA Assay (FDA     approved for NASAL specimens     only), is one component of a     comprehensive MRSA colonization     surveillance program. It is not     intended to diagnose MRSA     infection nor to guide or     monitor treatment for     MRSA infections.   Studies/Results: Portable Chest Xray In Am  11/29/2013   CLINICAL DATA:  Respiratory failure.  EXAM: PORTABLE CHEST - 1 VIEW  COMPARISON:  11/28/2013  FINDINGS: Endotracheal tube remains in place with tip approximately 5 cm above the carina. Enteric tube courses into the left upper abdomen  with tip not imaged. 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 The cardiac silhouette remains enlarged. Pulmonary edema does not appear significantly changed. Right larger than left pleural effusions are likely unchanged. Increased parenchymal opacity in lung bases is unchanged likely reflects atelectasis. No pneumothorax is identified.  IMPRESSION: Pulmonary edema and right larger than left pleural effusions without significant interval change.   Electronically Signed   By: Logan Bores   On: 11/29/2013 08:21   Portable Chest Xray  11/28/2013   CLINICAL DATA:  Endotracheal tube placement.  EXAM: PORTABLE CHEST - 1 VIEW  COMPARISON:  Two-view chest 11/26/2013.  FINDINGS: The heart is enlarged. 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 The tip of the endotracheal tube is 4.8 cm above the carina, within normal limits. An NG tube courses off the inferior border of the film. Moderate bilateral edema is now present. Bilateral pleural effusions are present, right greater than left. Bibasilar airspace disease likely reflects atelectasis. Infection is not excluded.  IMPRESSION: 1. Congestive heart failure with bilateral edema, pleural effusions, and airspace disease, right greater than left. 2. The airspace disease likely reflects atelectasis. Infection is not excluded. 3. Satisfactory positioning of the endotracheal tube 4.8 cm above the carina.   Electronically Signed   By: Lawrence Santiago M.D.   On: 11/28/2013 09:00    Medications:  Prior to Admission:  Prescriptions prior to admission  Medication Sig Dispense Refill  . amLODipine (NORVASC) 10 MG tablet Take 10 mg by mouth at bedtime.      Marland Kitchen atorvastatin (LIPITOR) 40 MG tablet Take 40 mg by mouth daily.      . clopidogrel (PLAVIX) 75 MG tablet Take 1 tablet (75 mg total) by mouth daily with breakfast.  30 tablet  6  . ferrous sulfate 325 (65 FE) MG EC tablet Take 325 mg by mouth daily with breakfast.      .  folic acid (FOLVITE) 1 MG tablet Take 1 tablet (1 mg total) by mouth daily.  30 tablet  4  . levalbuterol (XOPENEX) 0.63 MG/3ML nebulizer solution Take 3 mLs (0.63 mg total) by nebulization every 6 (six) hours as needed for wheezing or shortness of breath.  3 mL  4  . magnesium oxide (MAG-OX) 400 (241.3 MG) MG tablet Take 1 tablet (400 mg total) by mouth daily.  30 tablet  6  . metoprolol (LOPRESSOR) 100 MG tablet Take 1 tablet (100 mg total)  by mouth 2 (two) times daily.  60 tablet  6  . Multiple Vitamin (MULTIVITAMIN WITH MINERALS) TABS Take 1 tablet by mouth daily.      . pantoprazole (PROTONIX) 40 MG tablet Take 1 tablet (40 mg total) by mouth daily.  30 tablet  0  . potassium chloride SA (K-DUR,KLOR-CON) 20 MEQ tablet Take 20 mEq by mouth daily.      Marland Kitchen thiamine 100 MG tablet Take 1 tablet (100 mg total) by mouth daily.  30 tablet  4  . torsemide (DEMADEX) 20 MG tablet Take 2 tablets (40 mg total) by mouth 2 (two) times daily.  60 tablet  6  . warfarin (COUMADIN) 5 MG tablet Take 1 1/2 tablets (7.5 mg) on Mondays and  Fridays  take 1 tablet (5 mg) on Sunday, Tuesday, Wednesday Thursday and Saturday      . nitroGLYCERIN (NITROSTAT) 0.4 MG SL tablet Place 1 tablet (0.4 mg total) under the tongue every 5 (five) minutes x 3 doses as needed for chest pain.  25 tablet  3   Scheduled: . amLODipine  10 mg Oral QHS  . antiseptic oral rinse  15 mL Mouth Rinse QID  . antiseptic oral rinse  15 mL Mouth Rinse QID  . atorvastatin  40 mg Oral q1800  . chlorhexidine  15 mL Mouth Rinse BID  . clopidogrel  75 mg Oral Q breakfast  . ferrous sulfate  325 mg Oral Q breakfast  . folic acid  1 mg Oral Daily  . furosemide  60 mg Intravenous Q12H  . ipratropium-albuterol  3 mL Nebulization Q4H  . magnesium oxide  400 mg Oral Daily  . methylPREDNISolone (SOLU-MEDROL) injection  60 mg Intravenous Q6H  . metoprolol  100 mg Oral BID  . multivitamin with minerals  1 tablet Oral Daily  . nicotine  14 mg Transdermal  Daily  . pantoprazole (PROTONIX) IV  40 mg Intravenous Daily  . piperacillin-tazobactam (ZOSYN)  IV  3.375 g Intravenous Q8H  . sodium chloride  3 mL Intravenous Q12H  . thiamine  100 mg Oral Daily   Or  . thiamine  100 mg Intravenous Daily  . vancomycin  750 mg Intravenous Q12H  . Warfarin - Pharmacist Dosing Inpatient   Does not apply Q24H   Continuous: . propofol 40 mcg/kg/min (11/29/13 0604)   VFI:EPPIRJ chloride, albuterol, midazolam, sodium chloride  Assesment: He has acute respiratory failure which is multifactorial. He has pretty severe COPD at baseline. He also has congestive heart failure and his chest x-ray looked more like he had pulmonary edema as the cause of his respiratory failure. He is still on ventilator on 60% oxygen so I can't do anything about weaning him today but he has been diuresing. Principal Problem:   Seizure-like activity Active Problems:   ANEMIA   TOBACCO ABUSE   CHRONIC OBSTRUCTIVE PULMONARY DISEASE   MITRAL VALVE REPLACEMENT, HX OF   AORTIC VALVE REPLACEMENT, HX OF   Chronic anticoagulation   Coronary atherosclerosis of unspecified type of vessel, native or graft   Acute on chronic diastolic CHF (congestive heart failure), NYHA class 4   Hyponatremia   Atrial fibrillation status post cardioversion   Alcohol abuse    Plan: Continue current treatments    LOS: 3 days   [name] 11/29/2013, 9:08 AM

## 2013-11-29 NOTE — Progress Notes (Signed)
UR chart review completed.  

## 2013-11-29 NOTE — Progress Notes (Signed)
Primary cardiologist: Dr. Carlyle Dolly  Subjective:   Sedated on the ventilator.   Objective:   Temp:  [97.9 F (36.6 C)-100.7 F (38.2 C)] 100.7 F (38.2 C) (05/27 0754) Pulse Rate:  [74-109] 109 (05/27 0917) Resp:  [11-22] 15 (05/27 0500) BP: (120-158)/(47-102) 158/102 mmHg (05/27 0917) SpO2:  [89 %-99 %] 92 % (05/27 0652) FiO2 (%):  [60 %-100 %] 60 % (05/27 0654) Weight:  [162 lb 14.7 oz (73.9 kg)] 162 lb 14.7 oz (73.9 kg) (05/27 0500) Last BM Date: 11/24/13  Filed Weights   11/27/13 0400 11/28/13 0500 11/29/13 0500  Weight: 170 lb 3.1 oz (77.2 kg) 172 lb 9.9 oz (78.3 kg) 162 lb 14.7 oz (73.9 kg)    Intake/Output Summary (Last 24 hours) at 11/29/13 1035 Last data filed at 11/29/13 0500  Gross per 24 hour  Intake    310 ml  Output   2550 ml  Net  -2240 ml    Telemetry: Currently sinus rhythm.  Exam:  General: Chronically ill-appearing, sedated.  Lungs: Scattered rales, decreased breath sounds at the bases.  Cardiac: RRR, no gallop.  Abdomen: No guarding, decreased bowel sounds.  Extremities: No pitting.   Lab Results:  Basic Metabolic Panel:  Recent Labs Lab 11/26/13 1826 11/27/13 0626 11/27/13 1613 11/28/13 0412 11/29/13 0413  NA 125* 127*  --  134* 137  K 4.5 5.8* 4.9 4.8 4.0  CL 85* 88*  --  92* 92*  CO2 23 32  --  33* 34*  GLUCOSE 120* 106*  --  133* 177*  BUN 30* 30*  --  33* 32*  CREATININE 1.38* 1.36*  --  1.28 1.21  CALCIUM 8.3* 8.8  --  8.8 9.0  MG 1.7  --   --   --   --     CBC:  Recent Labs Lab 11/26/13 1826  WBC 6.7  HGB 9.2*  HCT 29.7*  MCV 91.1  PLT 312    Cardiac Enzymes:  Recent Labs Lab 11/27/13 0033 11/27/13 0626 11/27/13 1230  TROPONINI <0.30 <0.30 <0.30    Coagulation:  Recent Labs Lab 11/26/13 1826 11/28/13 0412 11/29/13 0413  INR 3.50* 2.59* 3.55*    ECG: Sinus rhythm with frequent PACs and LVH with repolarization abnormalities.   Medications:   Scheduled Medications: .  amLODipine  10 mg Oral QHS  . antiseptic oral rinse  15 mL Mouth Rinse QID  . antiseptic oral rinse  15 mL Mouth Rinse QID  . atorvastatin  40 mg Oral q1800  . chlorhexidine  15 mL Mouth Rinse BID  . clopidogrel  75 mg Oral Q breakfast  . ferrous sulfate  325 mg Oral Q breakfast  . folic acid  1 mg Oral Daily  . furosemide  60 mg Intravenous Q12H  . ipratropium-albuterol  3 mL Nebulization Q4H  . magnesium oxide  400 mg Oral Daily  . methylPREDNISolone (SOLU-MEDROL) injection  60 mg Intravenous Q6H  . metoprolol  100 mg Oral BID  . multivitamin with minerals  1 tablet Oral Daily  . nicotine  14 mg Transdermal Daily  . pantoprazole (PROTONIX) IV  40 mg Intravenous Daily  . piperacillin-tazobactam (ZOSYN)  IV  3.375 g Intravenous Q8H  . sodium chloride  3 mL Intravenous Q12H  . thiamine  100 mg Oral Daily   Or  . thiamine  100 mg Intravenous Daily  . vancomycin  750 mg Intravenous Q12H  . Warfarin - Pharmacist Dosing Inpatient   Does not  apply Q24H     Infusions: . propofol 40 mcg/kg/min (11/29/13 0604)     PRN Medications:  sodium chloride, albuterol, midazolam, sodium chloride   Assessment:   1. Hypoxic respiratory failure, currently requiring mechanical ventilation for support. Multifactorial in the setting of oxygen requiring COPD and diastolic heart failure. He is diuresing with IV Lasix. Followup chest x-ray shows pulmonary edema with right greater than left pleural effusions.  2. Question seizure-like activity, and neurology workup pending.   3. Multivessel CAD status post CABG as outlined above. Cardiac markers argue against ACS. ECG without acute ischemic changes.   4. Valvular heart disease status post St. Jude mechanical AVR and MVR. Last echocardiogram was in January of this year. On Coumadin chronically. INR therapeutic.  5. History of paroxysmal atrial fibrillation, currently in sinus rhythm.   6. Alcohol abuse.   7. Acute on chronic diastolic heart  failure, most recent LVEF 50-55%.   Plan/Discussion:    Continue current cardiac regimen except increase Lasix to 80 mg twice daily. He is beginning to diurese and renal function is stable.   Satira Sark, M.D., F.A.C.C.

## 2013-11-29 NOTE — Progress Notes (Signed)
Received referral for [facility] Care Management services from [facility] program. However, patient is currently intubated on the vent. Will attempt to engage at later time. Made inpatient RNCM aware. Marthenia Rolling, MSN- [facility] [phone]

## 2013-11-30 ENCOUNTER — Inpatient Hospital Stay (HOSPITAL_COMMUNITY): Payer: Medicare Other

## 2013-11-30 LAB — COMPREHENSIVE METABOLIC PANEL
ALK PHOS: 92 U/L (ref 39–117)
ALT: 10 U/L (ref 0–53)
AST: 30 U/L (ref 0–37)
Albumin: 2.9 g/dL — ABNORMAL LOW (ref 3.5–5.2)
BUN: 32 mg/dL — AB (ref 6–23)
CO2: 39 mEq/L — ABNORMAL HIGH (ref 19–32)
Calcium: 8.9 mg/dL (ref 8.4–10.5)
Chloride: 91 mEq/L — ABNORMAL LOW (ref 96–112)
Creatinine, Ser: 1.33 mg/dL (ref 0.50–1.35)
GFR calc non Af Amer: 53 mL/min — ABNORMAL LOW (ref >90)
GFR, EST AFRICAN AMERICAN: 62 mL/min — AB (ref >90)
GLUCOSE: 175 mg/dL — AB (ref 70–99)
POTASSIUM: 3.2 meq/L — AB (ref 3.7–5.3)
Sodium: 140 mEq/L (ref 137–147)
Total Bilirubin: 0.8 mg/dL (ref 0.3–1.2)
Total Protein: 6.3 g/dL (ref 6.0–8.3)

## 2013-11-30 LAB — CBC WITH DIFFERENTIAL/PLATELET
Basophils Absolute: 0 10*3/uL (ref 0.0–0.1)
Basophils Relative: 0 % (ref 0–1)
EOS ABS: 0 10*3/uL (ref 0.0–0.7)
EOS PCT: 0 % (ref 0–5)
HCT: 30 % — ABNORMAL LOW (ref 39.0–52.0)
HEMOGLOBIN: 9.3 g/dL — AB (ref 13.0–17.0)
LYMPHS ABS: 0.2 10*3/uL — AB (ref 0.7–4.0)
Lymphocytes Relative: 3 % — ABNORMAL LOW (ref 12–46)
MCH: 28.7 pg (ref 26.0–34.0)
MCHC: 31 g/dL (ref 30.0–36.0)
MCV: 92.6 fL (ref 78.0–100.0)
Monocytes Absolute: 0.3 10*3/uL (ref 0.1–1.0)
Monocytes Relative: 4 % (ref 3–12)
NEUTROS PCT: 93 % — AB (ref 43–77)
Neutro Abs: 7.1 10*3/uL (ref 1.7–7.7)
Platelets: 238 10*3/uL (ref 150–400)
RBC: 3.24 MIL/uL — ABNORMAL LOW (ref 4.22–5.81)
RDW: 16 % — ABNORMAL HIGH (ref 11.5–15.5)
WBC: 7.6 10*3/uL (ref 4.0–10.5)

## 2013-11-30 LAB — PROTIME-INR
INR: 4.98 — ABNORMAL HIGH (ref 0.00–1.49)
Prothrombin Time: 44.3 seconds — ABNORMAL HIGH (ref 11.6–15.2)

## 2013-11-30 LAB — BLOOD GAS, ARTERIAL
Acid-Base Excess: 15 mmol/L — ABNORMAL HIGH (ref 0.0–2.0)
Bicarbonate: 39.6 mEq/L — ABNORMAL HIGH (ref 20.0–24.0)
Drawn by: 21310
FIO2: 0.6 %
O2 Saturation: 98.3 %
PEEP: 5 cmH2O
PO2 ART: 104 mmHg — AB (ref 80.0–100.0)
Patient temperature: 37
RATE: 12 resp/min
TCO2: 36.1 mmol/L (ref 0–100)
VT: 550 mL
pCO2 arterial: 51.9 mmHg — ABNORMAL HIGH (ref 35.0–45.0)
pH, Arterial: 7.494 — ABNORMAL HIGH (ref 7.350–7.450)

## 2013-11-30 LAB — VANCOMYCIN, TROUGH: VANCOMYCIN TR: 29.2 ug/mL — AB (ref 10.0–20.0)

## 2013-11-30 MED ORDER — METOPROLOL TARTRATE 25 MG/10 ML ORAL SUSPENSION: 25.0000 mg | Freq: Two times a day (BID) | ORAL | Status: DC

## 2013-11-30 MED ORDER — METOPROLOL TARTRATE 25 MG/10 ML ORAL SUSPENSION: 25.0000 mg | Freq: Once | ORAL | Status: DC

## 2013-11-30 MED ORDER — POTASSIUM CHLORIDE 10 MEQ/100ML IV SOLN
10.0000 meq | INTRAVENOUS | Status: DC
  Administered 2013-11-30 (×3): 10 meq via INTRAVENOUS
  Filled 2013-11-30 (×3): qty 100

## 2013-11-30 MED ORDER — METOPROLOL TARTRATE 1 MG/ML IV SOLN
5.0000 mg | Freq: Four times a day (QID) | INTRAVENOUS | Status: DC
  Administered 2013-11-30: 5 mg via INTRAVENOUS
  Filled 2013-11-30: qty 5

## 2013-11-30 MED ORDER — POTASSIUM CHLORIDE 10 MEQ/100ML IV SOLN
10.0000 meq | INTRAVENOUS | Status: AC
  Administered 2013-11-30 (×3): 10 meq via INTRAVENOUS
  Filled 2013-11-30: qty 100

## 2013-11-30 MED ORDER — METOPROLOL TARTRATE 50 MG PO TABS
50.0000 mg | ORAL_TABLET | Freq: Two times a day (BID) | ORAL | Status: DC
  Administered 2013-12-01 – 2013-12-06 (×11): 50 mg
  Filled 2013-11-30 (×9): qty 1
  Filled 2013-11-30 (×2): qty 2
  Filled 2013-11-30: qty 1

## 2013-11-30 MED ORDER — METOPROLOL TARTRATE 25 MG PO TABS
25.0000 mg | ORAL_TABLET | Freq: Once | ORAL | Status: AC
  Administered 2013-11-30: 25 mg
  Filled 2013-11-30: qty 1

## 2013-11-30 MED ORDER — ACETAMINOPHEN 650 MG RE SUPP
650.0000 mg | Freq: Four times a day (QID) | RECTAL | Status: DC | PRN
  Administered 2013-11-30 – 2013-12-01 (×2): 650 mg via RECTAL
  Filled 2013-11-30 (×2): qty 1

## 2013-11-30 MED ORDER — METOPROLOL TARTRATE 1 MG/ML IV SOLN
5.0000 mg | Freq: Once | INTRAVENOUS | Status: AC
  Administered 2013-11-30: 5 mg via INTRAVENOUS
  Filled 2013-11-30: qty 5

## 2013-11-30 MED ORDER — METOPROLOL TARTRATE 1 MG/ML IV SOLN
5.0000 mg | INTRAVENOUS | Status: DC
  Administered 2013-11-30 – 2013-12-01 (×4): 5 mg via INTRAVENOUS
  Filled 2013-11-30 (×3): qty 5

## 2013-11-30 MED ORDER — METOPROLOL TARTRATE 1 MG/ML IV SOLN
5.0000 mg | INTRAVENOUS | Status: DC
  Administered 2013-11-30 (×2): 5 mg via INTRAVENOUS
  Filled 2013-11-30 (×2): qty 5

## 2013-11-30 MED ORDER — METOPROLOL TARTRATE 25 MG PO TABS
25.0000 mg | ORAL_TABLET | Freq: Two times a day (BID) | ORAL | Status: DC
  Administered 2013-11-30: 25 mg
  Filled 2013-11-30: qty 1

## 2013-11-30 NOTE — Progress Notes (Addendum)
ANTIBIOTIC CONSULT NOTE - follow up  Pharmacy Consult for Vancomycin & Zosyn Indication: pneumonia  No Known Allergies  Patient Measurements: Height: 5\' 7"  (170.2 cm) Weight: 155 lb 3.3 oz (70.4 kg) IBW/kg (Calculated) : 66.1  Vital Signs: Temp: 99.3 F (37.4 C) (05/28 1130) Temp src: Axillary (05/28 1130) BP: 107/60 mmHg (05/28 1400) Pulse Rate: 111 (05/28 1400) Intake/Output from previous day: 05/27 0701 - 05/28 0700 In: 1060.5 [I.V.:860.5; IV Piggyback:200] Out: 0626 [Urine:3925] Intake/Output from this shift:    Labs:  Recent Labs  11/28/13 0412 11/29/13 0413 11/30/13 0406  WBC  --   --  7.6  HGB  --   --  9.3*  PLT  --   --  238  CREATININE 1.28 1.21 1.33   Estimated Creatinine Clearance: 49.7 ml/min (by C-G formula based on Cr of 1.33).  Recent Labs  11/30/13 1303  VANCOTROUGH 29.2*  DISREGARD ABOVE VANCOMYCIN LEVEL SINCE DOSE WAS HUNG BEFORE LEVEL WAS DRAWN.    Microbiology: Recent Results (from the past 720 hour(s))  MRSA PCR SCREENING     Status: None   Collection Time    11/26/13  9:40 PM      Result Value Ref Range Status   MRSA by PCR NEGATIVE  NEGATIVE Final   Comment:            The GeneXpert MRSA Assay (FDA     approved for NASAL specimens     only), is one component of a     comprehensive MRSA colonization     surveillance program. It is not     intended to diagnose MRSA     infection nor to guide or     monitor treatment for     MRSA infections.   Medical History: Past Medical History  Diagnosis Date  . Essential hypertension, benign   . Coronary atherosclerosis of native coronary artery     a. CABG/MVR/AVR-2003 (#25 St. Jude/#21 St. Jude); anticoagulation; b. negative stress nuclear-2005;  c. 01/2013 NSTEMI/Cath/PCI: LM nl, LAD 50p, 40-92m, D1 50ost, LCX 60m, RCA 50/52m (2.5x20 Promus DES), 50d, VG->Diag 100, VG->PDA 100, VG->OM 100, LIMA->LAD ok.  . Epistaxis     Requiring cautery & aterial ligation-09/2009  . GERD  (gastroesophageal reflux disease)   . Hyperlipemia   . Abnormal LFTs     Possible cirrhosis  . Chronic anticoagulation   . Valvular heart disease     a. 2003: MVR/AVR-2003 (#25 St. Jude/#21 St. Jude);  b. 01/2013 Echo: EF 55%, Mech AVR mean grad 12, Mech MVR mean grad 6.  . Tobacco abuse     45 pack years  . Fasting hyperglycemia   . Nephrolithiasis   . Diverticulosis   . Hyponatremia   . Chronic diastolic CHF (congestive heart failure)   . Chronic renal disease   . Hemolytic anemia   . ETOH abuse   . Atrial fibrillation 01/16/2013    On coumadin DCCV 07/2013.   Marland Kitchen COPD (chronic obstructive pulmonary disease)    Medications:  Scheduled:  . antiseptic oral rinse  15 mL Mouth Rinse QID  . antiseptic oral rinse  15 mL Mouth Rinse QID  . chlorhexidine  15 mL Mouth Rinse BID  . furosemide  80 mg Intravenous Q12H  . ipratropium-albuterol  3 mL Nebulization Q4H  . methylPREDNISolone (SOLU-MEDROL) injection  60 mg Intravenous Q6H  . metoprolol  5 mg Intravenous Q4H  . nicotine  14 mg Transdermal Daily  . pantoprazole (PROTONIX) IV  40 mg  Intravenous Daily  . piperacillin-tazobactam (ZOSYN)  IV  3.375 g Intravenous Q8H  . potassium chloride  10 mEq Intravenous Q1 Hr x 3  . sodium chloride  3 mL Intravenous Q12H  . thiamine  100 mg Oral Daily   Or  . thiamine  100 mg Intravenous Daily  . vancomycin  750 mg Intravenous Q12H  . Warfarin - Pharmacist Dosing Inpatient   Does not apply Q24H   Assessment: 68 yo M admitted with HF exacerbation on 5/24.  He developed hypoxia & dyspnea overnight requiring intubation.  CXR today can not exclude PNA.  WBC is normal & afebrile.  Empiric, broad-spectrum antibiotics are being initiated for possible hospital- acquired infection.  Pt remains on ventilator.  Trough level was ordered today before 2pm dose today but nurse reported that he hung dose before level was drawn therefore level is invalid.  Disregard above trough level.    Vancomycin  5/26>> Zosyn 5/26>>  Goal of Therapy:  Vancomycin trough level 15-20 mcg/ml  Plan:  Zosyn 3.375gm IV Q8h to be infused over 4hrs Vancomycin 750mg  IV q12h Check Vancomycin random level tonight before next dose Monitor renal function and cx data   Scott A Hall 11/30/2013,2:46 PM   12/01/13 0230am Vancomycin trough level re-done this morning was 22.18mcg/ml @ 0121.  Goal for pneumonia coverage is 15-12mcg/ml.  Plan to change regimen to 750mg  IV q18h for a longer interval.

## 2013-11-30 NOTE — Progress Notes (Signed)
Primary cardiologist: Dr. Carlyle Dolly  Subjective:   Sedated on the ventilator.   Objective:   Temp:  [97.5 F (36.4 C)-99.7 F (37.6 C)] 99 F (37.2 C) (05/28 0814) Pulse Rate:  [79-134] 130 (05/28 0600) Resp:  [12-22] 12 (05/28 0600) BP: (103-170)/(56-122) 128/65 mmHg (05/28 0600) SpO2:  [92 %-98 %] 98 % (05/28 0812) FiO2 (%):  [60 %] 60 % (05/28 0812) Weight:  [155 lb 3.3 oz (70.4 kg)] 155 lb 3.3 oz (70.4 kg) (05/28 0500) Last BM Date: 11/24/13  Filed Weights   11/28/13 0500 11/29/13 0500 11/30/13 0500  Weight: 172 lb 9.9 oz (78.3 kg) 162 lb 14.7 oz (73.9 kg) 155 lb 3.3 oz (70.4 kg)    Intake/Output Summary (Last 24 hours) at 11/30/13 0825 Last data filed at 11/30/13 0600  Gross per 24 hour  Intake 1060.54 ml  Output   3925 ml  Net -2864.46 ml    Telemetry: Sinus rhythm with bursts of SVT/AF.  Exam:  General: Chronically ill-appearing, sedated.  Lungs: Clear anteriorly, decreased breath sounds at the bases.  Cardiac: RRR with ectopy, no gallop.  Abdomen: No guarding, decreased bowel sounds.  Extremities: No pitting.   Lab Results:  Basic Metabolic Panel:  Recent Labs Lab 11/26/13 1826  11/28/13 0412 11/29/13 0413 11/30/13 0406  NA 125*  < > 134* 137 140  K 4.5  < > 4.8 4.0 3.2*  CL 85*  < > 92* 92* 91*  CO2 23  < > 33* 34* 39*  GLUCOSE 120*  < > 133* 177* 175*  BUN 30*  < > 33* 32* 32*  CREATININE 1.38*  < > 1.28 1.21 1.33  CALCIUM 8.3*  < > 8.8 9.0 8.9  MG 1.7  --   --   --   --   < > = values in this interval not displayed.  CBC:  Recent Labs Lab 11/26/13 1826 11/30/13 0406  WBC 6.7 7.6  HGB 9.2* 9.3*  HCT 29.7* 30.0*  MCV 91.1 92.6  PLT 312 238    Cardiac Enzymes:  Recent Labs Lab 11/27/13 0033 11/27/13 0626 11/27/13 1230  TROPONINI <0.30 <0.30 <0.30    Coagulation:  Recent Labs Lab 11/28/13 0412 11/29/13 0413 11/30/13 0406  INR 2.59* 3.55* 4.98*     Medications:   Scheduled Medications: .  antiseptic oral rinse  15 mL Mouth Rinse QID  . antiseptic oral rinse  15 mL Mouth Rinse QID  . chlorhexidine  15 mL Mouth Rinse BID  . furosemide  80 mg Intravenous Q12H  . ipratropium-albuterol  3 mL Nebulization Q4H  . methylPREDNISolone (SOLU-MEDROL) injection  60 mg Intravenous Q6H  . metoprolol  5 mg Intravenous 4 times per day  . nicotine  14 mg Transdermal Daily  . pantoprazole (PROTONIX) IV  40 mg Intravenous Daily  . piperacillin-tazobactam (ZOSYN)  IV  3.375 g Intravenous Q8H  . sodium chloride  3 mL Intravenous Q12H  . thiamine  100 mg Oral Daily   Or  . thiamine  100 mg Intravenous Daily  . vancomycin  750 mg Intravenous Q12H  . Warfarin - Pharmacist Dosing Inpatient   Does not apply Q24H    Infusions: . propofol 40 mcg/kg/min (11/30/13 0600)    PRN Medications: sodium chloride, albuterol, midazolam, sodium chloride   Assessment:   1. Hypoxic respiratory failure, currently requiring mechanical ventilation for support. Multifactorial in the setting of oxygen requiring COPD and diastolic heart failure. He is diuresing with IV Lasix  and weight is coming down. Had 2800 cc out last 24 hours.  2. EEG without obvious epileptiform discharges. Seizure doubted at this time.  3. Multivessel CAD status post CABG as outlined above. Cardiac markers argue against ACS. ECG without acute ischemic changes.   4. Valvular heart disease status post St. Jude mechanical AVR and MVR. Last echocardiogram was in January of this year. On Coumadin chronically. INR therapeutic.  5. History of paroxysmal atrial fibrillation.   6. Alcohol abuse.   7. Acute on chronic diastolic heart failure, most recent LVEF 50-55%.   Plan/Discussion:    Followup CXR. Continue current dose of Lasix for now. Repeat potassium. Ventilator weaning per Dr. Luan Pulling. Will increase Lopressor IV dosing for better heart rate control, particularly with more SVT noted. Coumadin per pharmacy.    Satira Sark,  M.D., F.A.C.C.

## 2013-11-30 NOTE — Progress Notes (Signed)
ANTICOAGULATION CONSULT NOTE  Pharmacy Consult for Coumadin  Indication: Afib + St Jude's AVR/MVR   No Known Allergies  Patient Measurements: Height: 5\' 7"  (170.2 cm) Weight: 155 lb 3.3 oz (70.4 kg) IBW/kg (Calculated) : 66.1  Vital Signs: Temp: 97.5 F (36.4 C) (05/28 0400) Temp src: Axillary (05/28 0400) BP: 128/65 mmHg (05/28 0600) Pulse Rate: 130 (05/28 0600)  Labs:  Recent Labs  11/27/13 1230 11/28/13 0412 11/29/13 0413 11/30/13 0406  HGB  --   --   --  9.3*  HCT  --   --   --  30.0*  PLT  --   --   --  238  LABPROT  --  26.9* 34.2* 44.3*  INR  --  2.59* 3.55* 4.98*  CREATININE  --  1.28 1.21 1.33  TROPONINI <0.30  --   --   --    Estimated Creatinine Clearance: 49.7 ml/min (by C-G formula based on Cr of 1.33).  Medical History: Past Medical History  Diagnosis Date  . Essential hypertension, benign   . Coronary atherosclerosis of native coronary artery     a. CABG/MVR/AVR-2003 (#25 St. Jude/#21 St. Jude); anticoagulation; b. negative stress nuclear-2005;  c. 01/2013 NSTEMI/Cath/PCI: LM nl, LAD 50p, 40-4m, D1 50ost, LCX 33m, RCA 50/2m (2.5x20 Promus DES), 50d, VG->Diag 100, VG->PDA 100, VG->OM 100, LIMA->LAD ok.  . Epistaxis     Requiring cautery & aterial ligation-09/2009  . GERD (gastroesophageal reflux disease)   . Hyperlipemia   . Abnormal LFTs     Possible cirrhosis  . Chronic anticoagulation   . Valvular heart disease     a. 2003: MVR/AVR-2003 (#25 St. Jude/#21 St. Jude);  b. 01/2013 Echo: EF 55%, Mech AVR mean grad 12, Mech MVR mean grad 6.  . Tobacco abuse     45 pack years  . Fasting hyperglycemia   . Nephrolithiasis   . Diverticulosis   . Hyponatremia   . Chronic diastolic CHF (congestive heart failure)   . Chronic renal disease   . Hemolytic anemia   . ETOH abuse   . Atrial fibrillation 01/16/2013    On coumadin DCCV 07/2013.   Marland Kitchen COPD (chronic obstructive pulmonary disease)    Medications:  Prescriptions prior to admission  Medication  Sig Dispense Refill  . amLODipine (NORVASC) 10 MG tablet Take 10 mg by mouth at bedtime.      Marland Kitchen atorvastatin (LIPITOR) 40 MG tablet Take 40 mg by mouth daily.      . clopidogrel (PLAVIX) 75 MG tablet Take 1 tablet (75 mg total) by mouth daily with breakfast.  30 tablet  6  . ferrous sulfate 325 (65 FE) MG EC tablet Take 325 mg by mouth daily with breakfast.      . folic acid (FOLVITE) 1 MG tablet Take 1 tablet (1 mg total) by mouth daily.  30 tablet  4  . levalbuterol (XOPENEX) 0.63 MG/3ML nebulizer solution Take 3 mLs (0.63 mg total) by nebulization every 6 (six) hours as needed for wheezing or shortness of breath.  3 mL  4  . magnesium oxide (MAG-OX) 400 (241.3 MG) MG tablet Take 1 tablet (400 mg total) by mouth daily.  30 tablet  6  . metoprolol (LOPRESSOR) 100 MG tablet Take 1 tablet (100 mg total) by mouth 2 (two) times daily.  60 tablet  6  . Multiple Vitamin (MULTIVITAMIN WITH MINERALS) TABS Take 1 tablet by mouth daily.      . pantoprazole (PROTONIX) 40 MG tablet Take 1  tablet (40 mg total) by mouth daily.  30 tablet  0  . potassium chloride SA (K-DUR,KLOR-CON) 20 MEQ tablet Take 20 mEq by mouth daily.      Marland Kitchen thiamine 100 MG tablet Take 1 tablet (100 mg total) by mouth daily.  30 tablet  4  . torsemide (DEMADEX) 20 MG tablet Take 2 tablets (40 mg total) by mouth 2 (two) times daily.  60 tablet  6  . warfarin (COUMADIN) 5 MG tablet Take 1 1/2 tablets (7.5 mg) on Mondays and  Fridays  take 1 tablet (5 mg) on Sunday, Tuesday, Wednesday Thursday and Saturday      . nitroGLYCERIN (NITROSTAT) 0.4 MG SL tablet Place 1 tablet (0.4 mg total) under the tongue every 5 (five) minutes x 3 doses as needed for chest pain.  25 tablet  3   Assessment: 68yo male on chronic Coumadin PTA for h/o mitral and aortic valve replacement + AFib.  Recorded home dose (listed above) varies slightly from outpatient anticoag flow-sheet which states 5mg  daily except 7.5mg  on Mon.  Will need to clarify with patient once  able.    INR was therapeutic on admission & remains in goal range today but is on high end of range.  Pt remains sedated on ventilator.   Unfortunately, INR increased to supratherapeutic level despite dose reduction.  Will HOLD coumadin for now.   No bleeding noted.    Goal of Therapy:  INR 2.5 - 3.5    Plan:   HOLD coumadin today    INR daily  Clarify outpatient Coumadin regimen with patient when able  [employer] 11/30/2013,7:59 AM

## 2013-11-30 NOTE — Progress Notes (Signed)
Subjective: He remains intubated and on mechanical ventilation. He is down approximately 8 kg now from admission. However he remains on 60% oxygen.  Objective: Vital signs in last 24 hours: Temp:  [97.5 F (36.4 C)-99.7 F (37.6 C)] 99 F (37.2 C) (05/28 0814) Pulse Rate:  [79-134] 130 (05/28 0600) Resp:  [12-22] 12 (05/28 0600) BP: (103-170)/(56-122) 128/65 mmHg (05/28 0600) SpO2:  [92 %-98 %] 98 % (05/28 0812) FiO2 (%):  [60 %] 60 % (05/28 0812) Weight:  [70.4 kg (155 lb 3.3 oz)] 70.4 kg (155 lb 3.3 oz) (05/28 0500) Weight change: -3.5 kg (-7 lb 11.5 oz) Last BM Date: 11/24/13  Intake/Output from previous day: 05/27 0701 - 05/28 0700 In: 1060.5 [I.V.:860.5; IV Piggyback:200] Out: 7353 [Urine:3925]  PHYSICAL EXAM He is intubated and on mechanical ventilation. He has some blood in the endotracheal tube and the orogastric tube. His chest is relatively clear. His heart is regular with prosthetic heart valve sounds. His abdomen is still somewhat protuberant. He does not have peripheral edema. He is arousable and responsive.  Lab Results:    Basic Metabolic Panel:  Recent Labs  11/29/13 0413 11/30/13 0406  NA 137 140  K 4.0 3.2*  CL 92* 91*  CO2 34* 39*  GLUCOSE 177* 175*  BUN 32* 32*  CREATININE 1.21 1.33  CALCIUM 9.0 8.9   Liver Function Tests:  Recent Labs  11/30/13 0406  AST 30  ALT 10  ALKPHOS 92  BILITOT 0.8  PROT 6.3  ALBUMIN 2.9*   No results found for this basename: LIPASE, AMYLASE,  in the last 72 hours  Recent Labs  11/27/13 1613  AMMONIA 35   CBC:  Recent Labs  11/30/13 0406  WBC 7.6  NEUTROABS 7.1  HGB 9.3*  HCT 30.0*  MCV 92.6  PLT 238   Cardiac Enzymes:  Recent Labs  11/27/13 1230  TROPONINI <0.30   BNP: No results found for this basename: PROBNP,  in the last 72 hours D-Dimer: No results found for this basename: DDIMER,  in the last 72 hours CBG: No results found for this basename: GLUCAP,  in the last 72  hours Hemoglobin A1C: No results found for this basename: HGBA1C,  in the last 72 hours Fasting Lipid Panel:  Recent Labs  11/28/13 0841  TRIG 63   Thyroid Function Tests:  Recent Labs  11/28/13 0841  TSH 0.684   Anemia Panel:  Recent Labs  11/28/13 0841  VITAMINB12 577   Coagulation:  Recent Labs  11/29/13 0413 11/30/13 0406  LABPROT 34.2* 44.3*  INR 3.55* 4.98*   Urine Drug Screen: Drugs of Abuse  No results found for this basename: labopia, cocainscrnur, labbenz, amphetmu, thcu, labbarb    Alcohol Level: No results found for this basename: ETH,  in the last 72 hours Urinalysis: No results found for this basename: COLORURINE, APPERANCEUR, LABSPEC, PHURINE, GLUCOSEU, HGBUR, BILIRUBINUR, KETONESUR, PROTEINUR, UROBILINOGEN, NITRITE, LEUKOCYTESUR,  in the last 72 hours Misc. Labs:  ABGS  Recent Labs  11/30/13 0500  PHART 7.494*  PO2ART 104.0*  TCO2 36.1  HCO3 39.6*   CULTURES Recent Results (from the past 240 hour(s))  MRSA PCR SCREENING     Status: None   Collection Time    11/26/13  9:40 PM      Result Value Ref Range Status   MRSA by PCR NEGATIVE  NEGATIVE Final   Comment:            The GeneXpert MRSA Assay (FDA  approved for NASAL specimens     only), is one component of a     comprehensive MRSA colonization     surveillance program. It is not     intended to diagnose MRSA     infection nor to guide or     monitor treatment for     MRSA infections.   Studies/Results: Portable Chest Xray In Am  11/29/2013   CLINICAL DATA:  Respiratory failure.  EXAM: PORTABLE CHEST - 1 VIEW  COMPARISON:  11/28/2013  FINDINGS: Endotracheal tube remains in place with tip approximately 5 cm above the carina. Enteric tube courses into the left upper abdomen with tip not imaged. 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 The cardiac silhouette remains enlarged. Pulmonary edema does not appear significantly changed. Right larger than left pleural  effusions are likely unchanged. Increased parenchymal opacity in lung bases is unchanged likely reflects atelectasis. No pneumothorax is identified.  IMPRESSION: Pulmonary edema and right larger than left pleural effusions without significant interval change.   Electronically Signed   By: Logan Bores   On: 11/29/2013 08:21   Portable Chest Xray  11/28/2013   CLINICAL DATA:  Endotracheal tube placement.  EXAM: PORTABLE CHEST - 1 VIEW  COMPARISON:  Two-view chest 11/26/2013.  FINDINGS: The heart is enlarged. 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 The tip of the endotracheal tube is 4.8 cm above the carina, within normal limits. An NG tube courses off the inferior border of the film. Moderate bilateral edema is now present. Bilateral pleural effusions are present, right greater than left. Bibasilar airspace disease likely reflects atelectasis. Infection is not excluded.  IMPRESSION: 1. Congestive heart failure with bilateral edema, pleural effusions, and airspace disease, right greater than left. 2. The airspace disease likely reflects atelectasis. Infection is not excluded. 3. Satisfactory positioning of the endotracheal tube 4.8 cm above the carina.   Electronically Signed   By: Lawrence Santiago M.D.   On: 11/28/2013 09:00    Medications:  Prior to Admission:  Prescriptions prior to admission  Medication Sig Dispense Refill  . amLODipine (NORVASC) 10 MG tablet Take 10 mg by mouth at bedtime.      Marland Kitchen atorvastatin (LIPITOR) 40 MG tablet Take 40 mg by mouth daily.      . clopidogrel (PLAVIX) 75 MG tablet Take 1 tablet (75 mg total) by mouth daily with breakfast.  30 tablet  6  . ferrous sulfate 325 (65 FE) MG EC tablet Take 325 mg by mouth daily with breakfast.      . folic acid (FOLVITE) 1 MG tablet Take 1 tablet (1 mg total) by mouth daily.  30 tablet  4  . levalbuterol (XOPENEX) 0.63 MG/3ML nebulizer solution Take 3 mLs (0.63 mg total) by nebulization every 6 (six)  hours as needed for wheezing or shortness of breath.  3 mL  4  . magnesium oxide (MAG-OX) 400 (241.3 MG) MG tablet Take 1 tablet (400 mg total) by mouth daily.  30 tablet  6  . metoprolol (LOPRESSOR) 100 MG tablet Take 1 tablet (100 mg total) by mouth 2 (two) times daily.  60 tablet  6  . Multiple Vitamin (MULTIVITAMIN WITH MINERALS) TABS Take 1 tablet by mouth daily.      . pantoprazole (PROTONIX) 40 MG tablet Take 1 tablet (40 mg total) by mouth daily.  30 tablet  0  . potassium chloride SA (K-DUR,KLOR-CON) 20 MEQ tablet Take 20 mEq by mouth  daily.      . thiamine 100 MG tablet Take 1 tablet (100 mg total) by mouth daily.  30 tablet  4  . torsemide (DEMADEX) 20 MG tablet Take 2 tablets (40 mg total) by mouth 2 (two) times daily.  60 tablet  6  . warfarin (COUMADIN) 5 MG tablet Take 1 1/2 tablets (7.5 mg) on Mondays and  Fridays  take 1 tablet (5 mg) on Sunday, Tuesday, Wednesday Thursday and Saturday      . nitroGLYCERIN (NITROSTAT) 0.4 MG SL tablet Place 1 tablet (0.4 mg total) under the tongue every 5 (five) minutes x 3 doses as needed for chest pain.  25 tablet  3   Scheduled: . antiseptic oral rinse  15 mL Mouth Rinse QID  . antiseptic oral rinse  15 mL Mouth Rinse QID  . chlorhexidine  15 mL Mouth Rinse BID  . furosemide  80 mg Intravenous Q12H  . ipratropium-albuterol  3 mL Nebulization Q4H  . methylPREDNISolone (SOLU-MEDROL) injection  60 mg Intravenous Q6H  . metoprolol  5 mg Intravenous 4 times per day  . nicotine  14 mg Transdermal Daily  . pantoprazole (PROTONIX) IV  40 mg Intravenous Daily  . piperacillin-tazobactam (ZOSYN)  IV  3.375 g Intravenous Q8H  . sodium chloride  3 mL Intravenous Q12H  . thiamine  100 mg Oral Daily   Or  . thiamine  100 mg Intravenous Daily  . vancomycin  750 mg Intravenous Q12H  . Warfarin - Pharmacist Dosing Inpatient   Does not apply Q24H   Continuous: . propofol 40 mcg/kg/min (11/30/13 0600)   IRC:VELFYB chloride, albuterol, midazolam,  sodium chloride  Assesment: He was admitted with seizure like activity. He has multiple medical problems as listed. He now has acute on chronic respiratory failure requiring ventilator support. This is due to COPD and congestive heart failure with pulmonary edema. He is improving. Principal Problem:   Seizure-like activity Active Problems:   ANEMIA   TOBACCO ABUSE   CHRONIC OBSTRUCTIVE PULMONARY DISEASE   MITRAL VALVE REPLACEMENT, HX OF   AORTIC VALVE REPLACEMENT, HX OF   Chronic anticoagulation   Coronary atherosclerosis of unspecified type of vessel, native or graft   Acute on chronic diastolic CHF (congestive heart failure), NYHA class 4   Hyponatremia   Atrial fibrillation status post cardioversion   Alcohol abuse    Plan: Continue with current treatments. If we can get his FiO2 down I can make some effort at weaning    LOS: 4 days   [name] 11/30/2013, 8:29 AM

## 2013-11-30 NOTE — Progress Notes (Signed)
Patient ID: [name], male   DOB: September 23, 1945, 68 y.o.   MRN: [id number]  [name] A. Merlene Laughter, MD     [email or web address]          [name] is an 68 y.o. male.   Assessment/Plan: 1. Recurrent shaking most consistent with tremors. The semiology seems atypical for seizures. The shaking spell seems to be due to toxic metabolic derangements which patient has.EEG fine. No need for seizure medications at this time. I will sign off. Call PRN.  2. Moderate encephalopathy likely due to toxic metabolic derangements.   3. Alcoholism. Patient appropriately has been started on DT prophylaxis and also thiamine.   4. Respiratory failure w high PCO2.  The nurses reports that the patient is having shaking of the upper extremities bilaterally overnight and this morning. The spells are observed and seen consistent with tremors. Patient is intubated on propofol.   GENERAL: Intubated HEENT: Supple. Atraumatic normocephalic.  ABDOMEN: soft But with increase girth noted.  EXTREMITIES: Erythema is noted of the feet and distal legs. There is 2-3+ pitting edema up to the knees.  BACK: Normal.  SKIN: Normal by inspection.  MENTAL STATUS: Eyes are closed but opens to painful stimuli. He does follow commands on both sides.  CRANIAL NERVES: Pupils are equal, round and reactive to light; extra ocular movements are full - Via oculocephalic reflexes; upper and lower facial muscles are normal in strength and symmetric, there is no flattening of the nasolabial folds. Corneal and gag reflexes are intact. MOTOR: Moves both sides of pain- Spontaneously and purposefully. No shaking of the upper extremities this AM.  EEG 1. This recording of Awake and sleep states shows a moderate generalized slowing. There is also excessive spindles. Both of these effects are likely due to medications particularly propofol. There are no epileptiform discharges observed.     Objective: Vital signs in  last 24 hours: Temp:  [97.5 F (36.4 C)-99.7 F (37.6 C)] 99 F (37.2 C) (05/28 0814) Pulse Rate:  [79-134] 130 (05/28 0600) Resp:  [12-22] 12 (05/28 0600) BP: (103-170)/(56-122) 128/65 mmHg (05/28 0600) SpO2:  [92 %-98 %] 98 % (05/28 0812) FiO2 (%):  [60 %] 60 % (05/28 0812) Weight:  [70.4 kg (155 lb 3.3 oz)] 70.4 kg (155 lb 3.3 oz) (05/28 0500)  Intake/Output from previous day: 05/27 0701 - 05/28 0700 In: 1060.5 [I.V.:860.5; IV Piggyback:200] Out: 3925 [Urine:3925] Intake/Output this shift:   Nutritional status: NPO   Lab Results: Results for orders placed during the hospital encounter of 11/26/13 (from the past 48 hour(s))  TRIGLYCERIDES     Status: None   Collection Time    11/28/13  8:41 AM      Result Value Ref Range   Triglycerides 63  <150 mg/dL  VITAMIN B12     Status: None   Collection Time    11/28/13  8:41 AM      Result Value Ref Range   Vitamin B-12 577  211 - 911 pg/mL   Comment: Performed at [facility]     Status: None   Collection Time    11/28/13  8:41 AM      Result Value Ref Range   Homocysteine 6.5  4.0 - 15.4 umol/L   Comment: Performed at [facility]  TSH     Status: None   Collection Time    11/28/13  8:41 AM      Result Value Ref Range   TSH  0.684  0.350 - 4.500 uIU/mL   Comment: Please note change in reference range.     Performed at [facility], ARTERIAL     Status: Abnormal   Collection Time    11/28/13  9:55 AM      Result Value Ref Range   FIO2 100.00     Delivery systems VENTILATOR     Mode PRESSURE REGULATED VOLUME CONTROL     VT 550     Rate 12     Peep/cpap 5.0     pH, Arterial 7.289 (*) 7.350 - 7.450   pCO2 arterial 69.9 (*) 35.0 - 45.0 mmHg   Comment: CRITICAL RESULT CALLED TO, READ BACK BY AND VERIFIED WITH:     JESSICA CHILDRESS RN BY ROBIN POWELL RRT AT 0955 ON 11/28/13   pO2, Arterial 57.0 (*) 80.0 - 100.0 mmHg   Bicarbonate 32.4 (*) 20.0 - 24.0 mEq/L   TCO2 31.3   0 - 100 mmol/L   Acid-Base Excess 6.1 (*) 0.0 - 2.0 mmol/L   O2 Saturation 85.3     Patient temperature 37.0     Collection site LEFT RADIAL     Drawn by [id number]     Sample type ARTERIAL     Allens test (pass/fail) PASS  PASS  BASIC METABOLIC PANEL     Status: Abnormal   Collection Time    11/29/13  4:13 AM      Result Value Ref Range   Sodium 137  137 - 147 mEq/L   Potassium 4.0  3.7 - 5.3 mEq/L   Chloride 92 (*) 96 - 112 mEq/L   CO2 34 (*) 19 - 32 mEq/L   Glucose, Bld 177 (*) 70 - 99 mg/dL   BUN 32 (*) 6 - 23 mg/dL   Creatinine, Ser 1.21  0.50 - 1.35 mg/dL   Calcium 9.0  8.4 - 10.5 mg/dL   GFR calc non Af Amer 60 (*) >90 mL/min   GFR calc Af Amer 69 (*) >90 mL/min   Comment: (NOTE)     The eGFR has been calculated using the CKD EPI equation.     This calculation has not been validated in all clinical situations.     eGFR's persistently <90 mL/min signify possible Chronic Kidney     Disease.  PROTIME-INR     Status: Abnormal   Collection Time    11/29/13  4:13 AM      Result Value Ref Range   Prothrombin Time 34.2 (*) 11.6 - 15.2 seconds   INR 3.55 (*) 0.00 - 1.49  PROTIME-INR     Status: Abnormal   Collection Time    11/30/13  4:06 AM      Result Value Ref Range   Prothrombin Time 44.3 (*) 11.6 - 15.2 seconds   INR 4.98 (*) 0.00 - 1.49  CBC WITH DIFFERENTIAL     Status: Abnormal   Collection Time    11/30/13  4:06 AM      Result Value Ref Range   WBC 7.6  4.0 - 10.5 K/uL   RBC 3.24 (*) 4.22 - 5.81 MIL/uL   Hemoglobin 9.3 (*) 13.0 - 17.0 g/dL   HCT 30.0 (*) 39.0 - 52.0 %   MCV 92.6  78.0 - 100.0 fL   MCH 28.7  26.0 - 34.0 pg   MCHC 31.0  30.0 - 36.0 g/dL   RDW 16.0 (*) 11.5 - 15.5 %   Platelets 238  150 - 400  K/uL   Neutrophils Relative % 93 (*) 43 - 77 %   Neutro Abs 7.1  1.7 - 7.7 K/uL   Lymphocytes Relative 3 (*) 12 - 46 %   Lymphs Abs 0.2 (*) 0.7 - 4.0 K/uL   Monocytes Relative 4  3 - 12 %   Monocytes Absolute 0.3  0.1 - 1.0 K/uL   Eosinophils Relative 0   0 - 5 %   Eosinophils Absolute 0.0  0.0 - 0.7 K/uL   Basophils Relative 0  0 - 1 %   Basophils Absolute 0.0  0.0 - 0.1 K/uL  COMPREHENSIVE METABOLIC PANEL     Status: Abnormal   Collection Time    11/30/13  4:06 AM      Result Value Ref Range   Sodium 140  137 - 147 mEq/L   Potassium 3.2 (*) 3.7 - 5.3 mEq/L   Comment: DELTA CHECK NOTED   Chloride 91 (*) 96 - 112 mEq/L   CO2 39 (*) 19 - 32 mEq/L   Glucose, Bld 175 (*) 70 - 99 mg/dL   BUN 32 (*) 6 - 23 mg/dL   Creatinine, Ser 1.33  0.50 - 1.35 mg/dL   Calcium 8.9  8.4 - 10.5 mg/dL   Total Protein 6.3  6.0 - 8.3 g/dL   Albumin 2.9 (*) 3.5 - 5.2 g/dL   AST 30  0 - 37 U/L   ALT 10  0 - 53 U/L   Alkaline Phosphatase 92  39 - 117 U/L   Total Bilirubin 0.8  0.3 - 1.2 mg/dL   GFR calc non Af Amer 53 (*) >90 mL/min   GFR calc Af Amer 62 (*) >90 mL/min   Comment: (NOTE)     The eGFR has been calculated using the CKD EPI equation.     This calculation has not been validated in all clinical situations.     eGFR's persistently <90 mL/min signify possible Chronic Kidney     Disease.  BLOOD GAS, ARTERIAL     Status: Abnormal   Collection Time    11/30/13  5:00 AM      Result Value Ref Range   FIO2 0.60     Delivery systems VENTILATOR     Mode PRESSURE REGULATED VOLUME CONTROL     VT 550     Rate 12     Peep/cpap 5.0     pH, Arterial 7.494 (*) 7.350 - 7.450   pCO2 arterial 51.9 (*) 35.0 - 45.0 mmHg   pO2, Arterial 104.0 (*) 80.0 - 100.0 mmHg   Bicarbonate 39.6 (*) 20.0 - 24.0 mEq/L   TCO2 36.1  0 - 100 mmol/L   Acid-Base Excess 15.0 (*) 0.0 - 2.0 mmol/L   O2 Saturation 98.3     Patient temperature 37.0     Collection site RIGHT RADIAL     Drawn by [id number]     Sample type ARTERIAL DRAW     Allens test (pass/fail) PASS  PASS    Lipid Panel  Recent Labs  11/28/13 0841  TRIG 63    Studies/Results: Portable Chest Xray In Am  11/29/2013   CLINICAL DATA:  Respiratory failure.  EXAM: PORTABLE CHEST - 1 VIEW  COMPARISON:   11/28/2013  FINDINGS: Endotracheal tube remains in place with tip approximately 5 cm above the carina. Enteric tube courses into the left upper abdomen with tip not imaged. 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 The cardiac silhouette remains enlarged. Pulmonary edema does  not appear significantly changed. Right larger than left pleural effusions are likely unchanged. Increased parenchymal opacity in lung bases is unchanged likely reflects atelectasis. No pneumothorax is identified.  IMPRESSION: Pulmonary edema and right larger than left pleural effusions without significant interval change.   Electronically Signed   By: Logan Bores   On: 11/29/2013 08:21   Portable Chest Xray  11/28/2013   CLINICAL DATA:  Endotracheal tube placement.  EXAM: PORTABLE CHEST - 1 VIEW  COMPARISON:  Two-view chest 11/26/2013.  FINDINGS: The heart is enlarged. 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 The tip of the endotracheal tube is 4.8 cm above the carina, within normal limits. An NG tube courses off the inferior border of the film. Moderate bilateral edema is now present. Bilateral pleural effusions are present, right greater than left. Bibasilar airspace disease likely reflects atelectasis. Infection is not excluded.  IMPRESSION: 1. Congestive heart failure with bilateral edema, pleural effusions, and airspace disease, right greater than left. 2. The airspace disease likely reflects atelectasis. Infection is not excluded. 3. Satisfactory positioning of the endotracheal tube 4.8 cm above the carina.   Electronically Signed   By: Lawrence Santiago M.D.   On: 11/28/2013 09:00    Medications:  Scheduled Meds: . antiseptic oral rinse  15 mL Mouth Rinse QID  . antiseptic oral rinse  15 mL Mouth Rinse QID  . chlorhexidine  15 mL Mouth Rinse BID  . furosemide  80 mg Intravenous Q12H  . ipratropium-albuterol  3 mL Nebulization Q4H  . methylPREDNISolone (SOLU-MEDROL) injection   60 mg Intravenous Q6H  . metoprolol  5 mg Intravenous 4 times per day  . nicotine  14 mg Transdermal Daily  . pantoprazole (PROTONIX) IV  40 mg Intravenous Daily  . piperacillin-tazobactam (ZOSYN)  IV  3.375 g Intravenous Q8H  . sodium chloride  3 mL Intravenous Q12H  . thiamine  100 mg Oral Daily   Or  . thiamine  100 mg Intravenous Daily  . vancomycin  750 mg Intravenous Q12H  . Warfarin - Pharmacist Dosing Inpatient   Does not apply Q24H   Continuous Infusions: . propofol 40 mcg/kg/min (11/30/13 0600)   PRN Meds:.sodium chloride, albuterol, midazolam, sodium chloride     LOS: 4 days   Jaklyn Alen A. Merlene [name], M.D.  Diplomate, Tax adviser of Psychiatry and Neurology ( Neurology).

## 2013-12-01 ENCOUNTER — Inpatient Hospital Stay (HOSPITAL_COMMUNITY): Payer: Medicare Other

## 2013-12-01 LAB — BLOOD GAS, ARTERIAL
Acid-Base Excess: 14.5 mmol/L — ABNORMAL HIGH (ref 0.0–2.0)
BICARBONATE: 39.2 meq/L — AB (ref 20.0–24.0)
Drawn by: 38235
FIO2: 0.5 %
MECHVT: 550 mL
O2 Saturation: 95.2 %
PCO2 ART: 54.5 mmHg — AB (ref 35.0–45.0)
PEEP: 5 cmH2O
Patient temperature: 37
RATE: 12 resp/min
TCO2: 36 mmol/L (ref 0–100)
pH, Arterial: 7.47 — ABNORMAL HIGH (ref 7.350–7.450)
pO2, Arterial: 77.9 mmHg — ABNORMAL LOW (ref 80.0–100.0)

## 2013-12-01 LAB — CBC WITH DIFFERENTIAL/PLATELET
Basophils Absolute: 0 10*3/uL (ref 0.0–0.1)
Basophils Relative: 0 % (ref 0–1)
Eosinophils Absolute: 0 10*3/uL (ref 0.0–0.7)
Eosinophils Relative: 0 % (ref 0–5)
HCT: 32.8 % — ABNORMAL LOW (ref 39.0–52.0)
HEMOGLOBIN: 9.9 g/dL — AB (ref 13.0–17.0)
LYMPHS ABS: 0.2 10*3/uL — AB (ref 0.7–4.0)
Lymphocytes Relative: 3 % — ABNORMAL LOW (ref 12–46)
MCH: 28.4 pg (ref 26.0–34.0)
MCHC: 30.2 g/dL (ref 30.0–36.0)
MCV: 94 fL (ref 78.0–100.0)
MONOS PCT: 4 % (ref 3–12)
Monocytes Absolute: 0.3 10*3/uL (ref 0.1–1.0)
NEUTROS PCT: 93 % — AB (ref 43–77)
Neutro Abs: 7.2 10*3/uL (ref 1.7–7.7)
Platelets: 273 10*3/uL (ref 150–400)
RBC: 3.49 MIL/uL — AB (ref 4.22–5.81)
RDW: 16.4 % — ABNORMAL HIGH (ref 11.5–15.5)
WBC: 7.7 10*3/uL (ref 4.0–10.5)

## 2013-12-01 LAB — GLUCOSE, CAPILLARY
GLUCOSE-CAPILLARY: 138 mg/dL — AB (ref 70–99)
Glucose-Capillary: 166 mg/dL — ABNORMAL HIGH (ref 70–99)
Glucose-Capillary: 145 mg/dL — ABNORMAL HIGH (ref 70–99)

## 2013-12-01 LAB — BASIC METABOLIC PANEL
BUN: 34 mg/dL — ABNORMAL HIGH (ref 6–23)
CHLORIDE: 89 meq/L — AB (ref 96–112)
CO2: 40 mEq/L (ref 19–32)
Calcium: 9 mg/dL (ref 8.4–10.5)
Creatinine, Ser: 1.42 mg/dL — ABNORMAL HIGH (ref 0.50–1.35)
GFR calc Af Amer: 57 mL/min — ABNORMAL LOW (ref >90)
GFR, EST NON AFRICAN AMERICAN: 49 mL/min — AB (ref >90)
Glucose, Bld: 153 mg/dL — ABNORMAL HIGH (ref 70–99)
Potassium: 3.5 mEq/L — ABNORMAL LOW (ref 3.7–5.3)
SODIUM: 141 meq/L (ref 137–147)

## 2013-12-01 LAB — VANCOMYCIN, RANDOM: VANCOMYCIN RM: 22.2 ug/mL

## 2013-12-01 LAB — TRIGLYCERIDES: Triglycerides: 104 mg/dL (ref <150)

## 2013-12-01 LAB — TROPONIN I
Troponin I: 0.9 ng/mL (ref <0.30)
Troponin I: 1.04 ng/mL (ref <0.30)
Troponin I: 0.84 ng/mL (ref <0.30)

## 2013-12-01 LAB — PROTIME-INR
INR: 4.47 — AB (ref 0.00–1.49)
Prothrombin Time: 40.8 seconds — ABNORMAL HIGH (ref 11.6–15.2)

## 2013-12-01 MED ORDER — VANCOMYCIN HCL IN DEXTROSE 750-5 MG/150ML-% IV SOLN
750.0000 mg | INTRAVENOUS | Status: DC
  Administered 2013-12-01 – 2013-12-05 (×6): 750 mg via INTRAVENOUS
  Filled 2013-12-01 (×6): qty 150

## 2013-12-01 MED ORDER — VANCOMYCIN HCL IN DEXTROSE 750-5 MG/150ML-% IV SOLN
750.0000 mg | INTRAVENOUS | Status: DC
  Filled 2013-12-01: qty 150

## 2013-12-01 MED ORDER — POTASSIUM CHLORIDE 10 MEQ/100ML IV SOLN
10.0000 meq | INTRAVENOUS | Status: AC
  Administered 2013-12-01 (×5): 10 meq via INTRAVENOUS
  Filled 2013-12-01 (×5): qty 100

## 2013-12-01 MED ORDER — VITAL HIGH PROTEIN PO LIQD
1000.0000 mL | ORAL | Status: DC
  Administered 2013-12-01 (×2): 1000 mL
  Administered 2013-12-02
  Administered 2013-12-02: 1000 mL
  Filled 2013-12-01 (×4): qty 1000

## 2013-12-01 MED ORDER — DILTIAZEM HCL 100 MG IV SOLR
5.0000 mg/h | INTRAVENOUS | Status: DC
  Administered 2013-12-01: 5 mg/h via INTRAVENOUS
  Administered 2013-12-02: 10 mg/h via INTRAVENOUS
  Administered 2013-12-02: 15 mg/h via INTRAVENOUS
  Administered 2013-12-02: 5 mg/h via INTRAVENOUS
  Administered 2013-12-03: 10 mg/h via INTRAVENOUS
  Filled 2013-12-01: qty 100

## 2013-12-01 MED ORDER — FUROSEMIDE 10 MG/ML IJ SOLN
60.0000 mg | Freq: Two times a day (BID) | INTRAMUSCULAR | Status: DC
  Administered 2013-12-01 – 2013-12-04 (×6): 60 mg via INTRAVENOUS
  Filled 2013-12-01 (×6): qty 6

## 2013-12-01 MED ORDER — SODIUM CHLORIDE 0.9 % IV BOLUS (SEPSIS)
500.0000 mL | Freq: Once | INTRAVENOUS | Status: AC
  Administered 2013-12-01: 500 mL via INTRAVENOUS

## 2013-12-01 MED ORDER — PRO-STAT SUGAR FREE PO LIQD
30.0000 mL | Freq: Two times a day (BID) | ORAL | Status: DC
  Administered 2013-12-01 – 2013-12-02 (×4): 30 mL
  Filled 2013-12-01 (×4): qty 30

## 2013-12-01 MED ORDER — INSULIN ASPART 100 UNIT/ML ~~LOC~~ SOLN
5.0000 [IU] | SUBCUTANEOUS | Status: DC
  Administered 2013-12-02 (×3): 5 [IU] via SUBCUTANEOUS

## 2013-12-01 MED ORDER — POTASSIUM CHLORIDE 10 MEQ/100ML IV SOLN
10.0000 meq | Freq: Once | INTRAVENOUS | Status: AC
  Administered 2013-12-01: 10 meq via INTRAVENOUS
  Filled 2013-12-01: qty 100

## 2013-12-01 NOTE — Progress Notes (Signed)
Subjective He developed atrial fibrillation last night it is now is on a Cardizem drip. Despite that he still has heart rates are running around 100. He has also been receiving intravenous metoprolol to replace the oral metoprolol he has been taking at home. He remains intubated on the ventilator. No other new complaints been noted  Objective: Vital signs in last 24 hours: Temp:  [98.7 F (37.1 C)-99.7 F (37.6 C)] 99.3 F (37.4 C) (05/29 0337) Pulse Rate:  [65-144] 89 (05/29 0630) Resp:  [12-18] 13 (05/29 0630) BP: (107-140)/(56-94) 112/63 mmHg (05/29 0630) SpO2:  [94 %-100 %] 96 % (05/29 0722) FiO2 (%):  [50 %-60 %] 50 % (05/29 0722) Weight:  [68.8 kg (151 lb 10.8 oz)] 68.8 kg (151 lb 10.8 oz) (05/29 0600) Weight change: -1.6 kg (-3 lb 8.4 oz) Last BM Date: 11/24/13  Intake/Output from previous day: 05/28 0701 - 05/29 0700 In: 931.6 [I.V.:381.6; IV Piggyback:550] Out: 2650 [Urine:2350; Emesis/NG output:300]  PHYSICAL EXAM General appearance: Intubated sedated and on mechanical ventilator Resp: rhonchi bilaterally Cardio: irregularly irregular rhythm GI: soft, non-tender; bowel sounds normal; no masses,  no organomegaly Extremities: venous stasis dermatitis noted  Lab Results:    Basic Metabolic Panel:  Recent Labs  11/30/13 0406 12/01/13 0121  NA 140 141  K 3.2* 3.5*  CL 91* 89*  CO2 39* 40*  GLUCOSE 175* 153*  BUN 32* 34*  CREATININE 1.33 1.42*  CALCIUM 8.9 9.0   Liver Function Tests:  Recent Labs  11/30/13 0406  AST 30  ALT 10  ALKPHOS 92  BILITOT 0.8  PROT 6.3  ALBUMIN 2.9*   No results found for this basename: LIPASE, AMYLASE,  in the last 72 hours No results found for this basename: AMMONIA,  in the last 72 hours CBC:  Recent Labs  11/30/13 0406 12/01/13 0121  WBC 7.6 7.7  NEUTROABS 7.1 7.2  HGB 9.3* 9.9*  HCT 30.0* 32.8*  MCV 92.6 94.0  PLT 238 273   Cardiac Enzymes: No results found for this basename: CKTOTAL, CKMB, CKMBINDEX,  TROPONINI,  in the last 72 hours BNP: No results found for this basename: PROBNP,  in the last 72 hours D-Dimer: No results found for this basename: DDIMER,  in the last 72 hours CBG: No results found for this basename: GLUCAP,  in the last 72 hours Hemoglobin A1C: No results found for this basename: HGBA1C,  in the last 72 hours Fasting Lipid Panel:  Recent Labs  11/28/13 0841  TRIG 63   Thyroid Function Tests:  Recent Labs  11/28/13 0841  TSH 0.684   Anemia Panel:  Recent Labs  11/28/13 0841  VITAMINB12 577   Coagulation:  Recent Labs  11/30/13 0406 12/01/13 0121  LABPROT 44.3* 40.8*  INR 4.98* 4.47*   Urine Drug Screen: Drugs of Abuse  No results found for this basename: labopia, cocainscrnur, labbenz, amphetmu, thcu, labbarb    Alcohol Level: No results found for this basename: ETH,  in the last 72 hours Urinalysis: No results found for this basename: COLORURINE, APPERANCEUR, LABSPEC, PHURINE, GLUCOSEU, HGBUR, BILIRUBINUR, KETONESUR, PROTEINUR, UROBILINOGEN, NITRITE, LEUKOCYTESUR,  in the last 72 hours Misc. Labs:  ABGS  Recent Labs  12/01/13 0500  PHART 7.470*  PO2ART 77.9*  TCO2 36.0  HCO3 39.2*   CULTURES Recent Results (from the past 240 hour(s))  MRSA PCR SCREENING     Status: None   Collection Time    11/26/13  9:40 PM      Result Value Ref  Range Status   MRSA by PCR NEGATIVE  NEGATIVE Final   Comment:            The GeneXpert MRSA Assay (FDA     approved for NASAL specimens     only), is one component of a     comprehensive MRSA colonization     surveillance program. It is not     intended to diagnose MRSA     infection nor to guide or     monitor treatment for     MRSA infections.   Studies/Results: Dg Chest Port 1 View  11/30/2013   CLINICAL DATA:  Respiratory failure.  EXAM: PORTABLE CHEST - 1 VIEW  COMPARISON:  11/29/2013, 11/28/2013, and 11/26/2013  FINDINGS: Endotracheal tube tip is 4 cm above the carina in good position.  NG tube tip is below the diaphragm. Bilateral pulmonary edema has improved. There are moderate bilateral pleural effusions which are essentially unchanged. Persistent cardiomegaly and pulmonary vascular congestion.  IMPRESSION: Improving congestive heart failure.   Electronically Signed   By: Rozetta Nunnery M.D.   On: 11/30/2013 09:49   Portable Chest Xray In Am  11/29/2013   CLINICAL DATA:  Respiratory failure.  EXAM: PORTABLE CHEST - 1 VIEW  COMPARISON:  11/28/2013  FINDINGS: Endotracheal tube remains in place with tip approximately 5 cm above the carina. Enteric tube courses into the left upper abdomen with tip not imaged. 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 The cardiac silhouette remains enlarged. Pulmonary edema does not appear significantly changed. Right larger than left pleural effusions are likely unchanged. Increased parenchymal opacity in lung bases is unchanged likely reflects atelectasis. No pneumothorax is identified.  IMPRESSION: Pulmonary edema and right larger than left pleural effusions without significant interval change.   Electronically Signed   By: Logan Bores   On: 11/29/2013 08:21    Medications:  Prior to Admission:  Prescriptions prior to admission  Medication Sig Dispense Refill  . amLODipine (NORVASC) 10 MG tablet Take 10 mg by mouth at bedtime.      Marland Kitchen atorvastatin (LIPITOR) 40 MG tablet Take 40 mg by mouth daily.      . clopidogrel (PLAVIX) 75 MG tablet Take 1 tablet (75 mg total) by mouth daily with breakfast.  30 tablet  6  . ferrous sulfate 325 (65 FE) MG EC tablet Take 325 mg by mouth daily with breakfast.      . folic acid (FOLVITE) 1 MG tablet Take 1 tablet (1 mg total) by mouth daily.  30 tablet  4  . levalbuterol (XOPENEX) 0.63 MG/3ML nebulizer solution Take 3 mLs (0.63 mg total) by nebulization every 6 (six) hours as needed for wheezing or shortness of breath.  3 mL  4  . magnesium oxide (MAG-OX) 400 (241.3 MG) MG tablet Take 1 tablet (400 mg  total) by mouth daily.  30 tablet  6  . metoprolol (LOPRESSOR) 100 MG tablet Take 1 tablet (100 mg total) by mouth 2 (two) times daily.  60 tablet  6  . Multiple Vitamin (MULTIVITAMIN WITH MINERALS) TABS Take 1 tablet by mouth daily.      . pantoprazole (PROTONIX) 40 MG tablet Take 1 tablet (40 mg total) by mouth daily.  30 tablet  0  . potassium chloride SA (K-DUR,KLOR-CON) 20 MEQ tablet Take 20 mEq by mouth daily.      Marland Kitchen thiamine 100 MG tablet Take 1 tablet (100 mg total) by mouth daily.  30 tablet  4  .  torsemide (DEMADEX) 20 MG tablet Take 2 tablets (40 mg total) by mouth 2 (two) times daily.  60 tablet  6  . warfarin (COUMADIN) 5 MG tablet Take 1 1/2 tablets (7.5 mg) on Mondays and  Fridays  take 1 tablet (5 mg) on Sunday, Tuesday, Wednesday Thursday and Saturday      . nitroGLYCERIN (NITROSTAT) 0.4 MG SL tablet Place 1 tablet (0.4 mg total) under the tongue every 5 (five) minutes x 3 doses as needed for chest pain.  25 tablet  3   Scheduled: . antiseptic oral rinse  15 mL Mouth Rinse QID  . antiseptic oral rinse  15 mL Mouth Rinse QID  . chlorhexidine  15 mL Mouth Rinse BID  . furosemide  80 mg Intravenous Q12H  . ipratropium-albuterol  3 mL Nebulization Q4H  . methylPREDNISolone (SOLU-MEDROL) injection  60 mg Intravenous Q6H  . metoprolol  5 mg Intravenous Q4H  . metoprolol tartrate  50 mg Per Tube BID  . nicotine  14 mg Transdermal Daily  . pantoprazole (PROTONIX) IV  40 mg Intravenous Daily  . piperacillin-tazobactam (ZOSYN)  IV  3.375 g Intravenous Q8H  . potassium chloride  10 mEq Intravenous Q1 Hr x 6  . sodium chloride  3 mL Intravenous Q12H  . thiamine  100 mg Oral Daily   Or  . thiamine  100 mg Intravenous Daily  . vancomycin  750 mg Intravenous Q18H  . Warfarin - Pharmacist Dosing Inpatient   Does not apply Q24H   Continuous: . diltiazem (CARDIZEM) infusion 15 mg/hr (12/01/13 0600)  . propofol 40 mcg/kg/min (12/01/13 0600)   JJH:ERDEYC chloride, acetaminophen,  albuterol, midazolam, sodium chloride  Assesment: He was admitted initially after an episode of shaking seizure-like activity did seem to be related to hypoxia. Since that time he developed progressive respiratory failure and has required intubation and mechanical ventilation. He had pulmonary edema. He has been diuresing well. Last night he developed atrial fibrillation and is on Cardizem drip but still does not have full control of his heart rate. He's doing better with respiratory failure. He is known to have acute on chronic diastolic CHF. He has been chronically anticoagulated and his INR is elevated. At baseline he has pretty severe COPD. He has had previous episodes of atrial fibrillation. 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 Principal Problem:   Seizure-like activity Active Problems:   ANEMIA   TOBACCO ABUSE   CHRONIC OBSTRUCTIVE PULMONARY DISEASE   MITRAL VALVE REPLACEMENT, HX OF   AORTIC VALVE REPLACEMENT, HX OF   Chronic anticoagulation   Coronary atherosclerosis of unspecified type of vessel, native or graft   Acute on chronic diastolic CHF (congestive heart failure), NYHA class 4   Hyponatremia   Atrial fibrillation status post cardioversion   Alcohol abuse    Plan: Continue Cardizem et Ronney Asters. His potassium is borderline low so he will have potassium replacement. He will continue diuresis. I don't think we can try to get him off the ventilator now because he is having to get the Cardizem drip and still doesn't have good control of his heart rate.    LOS: 5 days   Alonza Bogus 12/01/2013, 7:27 AM

## 2013-12-01 NOTE — Progress Notes (Signed)
ANTICOAGULATION CONSULT NOTE  Pharmacy Consult for Coumadin  Indication: Afib + St Jude's AVR/MVR   No Known Allergies  Patient Measurements: Height: 5\' 7"  (170.2 cm) Weight: 151 lb 10.8 oz (68.8 kg) IBW/kg (Calculated) : 66.1  Vital Signs: Temp: 99.3 F (37.4 C) (05/29 0337) Temp src: Oral (05/29 0337) BP: 112/63 mmHg (05/29 0630) Pulse Rate: 89 (05/29 0630)  Labs:  Recent Labs  11/29/13 0413 11/30/13 0406 12/01/13 0121  HGB  --  9.3* 9.9*  HCT  --  30.0* 32.8*  PLT  --  238 273  LABPROT 34.2* 44.3* 40.8*  INR 3.55* 4.98* 4.47*  CREATININE 1.21 1.33 1.42*   Estimated Creatinine Clearance: 46.5 ml/min (by C-G formula based on Cr of 1.42).  Medical History: Past Medical History  Diagnosis Date  . Essential hypertension, benign   . Coronary atherosclerosis of native coronary artery     a. CABG/MVR/AVR-2003 (#25 St. Jude/#21 St. Jude); anticoagulation; b. negative stress nuclear-2005;  c. 01/2013 NSTEMI/Cath/PCI: LM nl, LAD 50p, 40-43m, D1 50ost, LCX 48m, RCA 50/67m (2.5x20 Promus DES), 50d, VG->Diag 100, VG->PDA 100, VG->OM 100, LIMA->LAD ok.  . Epistaxis     Requiring cautery & aterial ligation-09/2009  . GERD (gastroesophageal reflux disease)   . Hyperlipemia   . Abnormal LFTs     Possible cirrhosis  . Chronic anticoagulation   . Valvular heart disease     a. 2003: MVR/AVR-2003 (#25 St. Jude/#21 St. Jude);  b. 01/2013 Echo: EF 55%, Mech AVR mean grad 12, Mech MVR mean grad 6.  . Tobacco abuse     45 pack years  . Fasting hyperglycemia   . Nephrolithiasis   . Diverticulosis   . Hyponatremia   . Chronic diastolic CHF (congestive heart failure)   . Chronic renal disease   . Hemolytic anemia   . ETOH abuse   . Atrial fibrillation 01/16/2013    On coumadin DCCV 07/2013.   Marland Kitchen COPD (chronic obstructive pulmonary disease)    Medications:  Prescriptions prior to admission  Medication Sig Dispense Refill  . amLODipine (NORVASC) 10 MG tablet Take 10 mg by mouth at  bedtime.      Marland Kitchen atorvastatin (LIPITOR) 40 MG tablet Take 40 mg by mouth daily.      . clopidogrel (PLAVIX) 75 MG tablet Take 1 tablet (75 mg total) by mouth daily with breakfast.  30 tablet  6  . ferrous sulfate 325 (65 FE) MG EC tablet Take 325 mg by mouth daily with breakfast.      . folic acid (FOLVITE) 1 MG tablet Take 1 tablet (1 mg total) by mouth daily.  30 tablet  4  . levalbuterol (XOPENEX) 0.63 MG/3ML nebulizer solution Take 3 mLs (0.63 mg total) by nebulization every 6 (six) hours as needed for wheezing or shortness of breath.  3 mL  4  . magnesium oxide (MAG-OX) 400 (241.3 MG) MG tablet Take 1 tablet (400 mg total) by mouth daily.  30 tablet  6  . metoprolol (LOPRESSOR) 100 MG tablet Take 1 tablet (100 mg total) by mouth 2 (two) times daily.  60 tablet  6  . Multiple Vitamin (MULTIVITAMIN WITH MINERALS) TABS Take 1 tablet by mouth daily.      . pantoprazole (PROTONIX) 40 MG tablet Take 1 tablet (40 mg total) by mouth daily.  30 tablet  0  . potassium chloride SA (K-DUR,KLOR-CON) 20 MEQ tablet Take 20 mEq by mouth daily.      Marland Kitchen thiamine 100 MG tablet Take  1 tablet (100 mg total) by mouth daily.  30 tablet  4  . torsemide (DEMADEX) 20 MG tablet Take 2 tablets (40 mg total) by mouth 2 (two) times daily.  60 tablet  6  . warfarin (COUMADIN) 5 MG tablet Take 1 1/2 tablets (7.5 mg) on Mondays and  Fridays  take 1 tablet (5 mg) on Sunday, Tuesday, Wednesday Thursday and Saturday      . nitroGLYCERIN (NITROSTAT) 0.4 MG SL tablet Place 1 tablet (0.4 mg total) under the tongue every 5 (five) minutes x 3 doses as needed for chest pain.  25 tablet  3   Assessment: 68yo male on chronic Coumadin PTA for h/o mitral and aortic valve replacement + AFib.  Recorded home dose (listed above) varies slightly from outpatient anticoag flow-sheet which states 5mg  daily except 7.5mg  on Mon.  Will need to clarify with patient once able.    INR was therapeutic on admission, but has trended above goal range likely  due to acute illness.  Pt remains sedated on ventilator.   No bleeding noted.    Goal of Therapy:  INR 2.5 - 3.5    Plan:   HOLD Coumadin today    INR daily  Clarify outpatient Coumadin regimen with patient when able  Lavonia Drafts Nashonda Limberg 12/01/2013,8:02 AM

## 2013-12-01 NOTE — Progress Notes (Signed)
[facility] Progress Note Patient Name: [name] DOB: 1945-08-16 MRN: [id number]  Date of Service  12/01/2013   HPI/Events of Note  Hr 150 sinus with pac on monitor - despite lorpressor   eICU Interventions  Start cardizem gtt   Intervention Category Major Interventions: Arrhythmia - evaluation and management  Brand Males 12/01/2013, 1:36 AM

## 2013-12-01 NOTE — Progress Notes (Signed)
Nutrition Follow-up   INTERVENTION: Initiate Vital HP @ 40  ml/hr via OGT.  Add 30 ml Prostat BID    Tube feeding regimen provides 1160 kcal (plus propofol which adds 496 kcal) meets (100% of energy and protein needs), 114 grams of protein, and 802 ml of H2O.    NUTRITION DIAGNOSIS: Inadequate oral intake  related to inability to eat as evidenced by vent status and limited caloric intake via propofol  Goal: Pt to meet >/= 90% of their estimated nutrition needs; not met   Monitor:  Respiratory status, nutrition support, labs, I/O's and weight trends  Reason for Assessment: Consult for initiation and management of enteral nutrition  68 y.o. male  Admitting Dx: Seizure-like activity  ASSESSMENT: Pt with respiratory failure, intubated, sedated, with OGT left mouth. Hx includes ETOH abuse, valvular heart dz.and acute on chronic diastolic heart failure. Significant wt gain 5 kg, 7% in 30 days. He is receiving diuretic.   Fluid status improved his weight has decreased 9 kg from admission. Pt not ready to wean at this time and enteral nutrition is being initiated. Estimated needs reassessed.  Vent setting: MV: 6.8 L/min Temp (24hrs), Avg:99.2 F (37.3 C), Min:98.7 F (37.1 C), Max:99.7 F (37.6 C)  Propofol: 18.8 ml/hr (provides 496 kcal lipids at current rate q 24 hr)   Height: Ht Readings from Last 1 Encounters:  11/30/13 $RemoveB'5\' 7"'BpbUSAVa$  (1.702 m)    Weight: Wt Readings from Last 1 Encounters:  12/01/13 151 lb 10.8 oz (68.8 kg)  Admit weight-172.9# (78.3 kg)  Ideal Body Weight: 148# (67 kg)  Wt Readings from Last 10 Encounters:  12/01/13 151 lb 10.8 oz (68.8 kg)  10/30/13 162 lb (73.483 kg)  10/23/13 160 lb (72.576 kg)  10/11/13 160 lb (72.576 kg)  09/04/13 157 lb (71.215 kg)  08/16/13 148 lb 7.7 oz (67.35 kg)  08/03/13 152 lb (68.947 kg)  07/30/13 142 lb 12.8 oz (64.774 kg)  07/30/13 142 lb 12.8 oz (64.774 kg)  06/01/13 160 lb 8 oz (72.802 kg)    Usual Body Weight:  160#  BMI:  Body mass index is 23.75 kg/(m^2).overweight  Estimated Nutritional Needs: Kcal: 5462 Protein: 103-120 gr Fluid: per team goal  Skin: intact  Diet Order: NPO  EDUCATION NEEDS: -Education not appropriate at this time   Intake/Output Summary (Last 24 hours) at 12/01/13 0810 Last data filed at 12/01/13 0600  Gross per 24 hour  Intake 931.57 ml  Output   2950 ml  Net -2018.43 ml    Last BM: 11/24/13   Labs:   Recent Labs Lab 11/26/13 1826  11/29/13 0413 11/30/13 0406 12/01/13 0121  NA 125*  < > 137 140 141  K 4.5  < > 4.0 3.2* 3.5*  CL 85*  < > 92* 91* 89*  CO2 23  < > 34* 39* 40*  BUN 30*  < > 32* 32* 34*  CREATININE 1.38*  < > 1.21 1.33 1.42*  CALCIUM 8.3*  < > 9.0 8.9 9.0  MG 1.7  --   --   --   --   GLUCOSE 120*  < > 177* 175* 153*  < > = values in this interval not displayed.  CBG (last 3)  No results found for this basename: GLUCAP,  in the last 72 hours  Scheduled Meds: . antiseptic oral rinse  15 mL Mouth Rinse QID  . chlorhexidine  15 mL Mouth Rinse BID  . feeding supplement (VITAL HIGH PROTEIN)  1,000 mL Per  Tube Q24H  . furosemide  80 mg Intravenous Q12H  . insulin aspart  5 Units Subcutaneous 6 times per day  . ipratropium-albuterol  3 mL Nebulization Q4H  . methylPREDNISolone (SOLU-MEDROL) injection  60 mg Intravenous Q6H  . metoprolol tartrate  50 mg Per Tube BID  . nicotine  14 mg Transdermal Daily  . pantoprazole (PROTONIX) IV  40 mg Intravenous Daily  . piperacillin-tazobactam (ZOSYN)  IV  3.375 g Intravenous Q8H  . potassium chloride  10 mEq Intravenous Q1 Hr x 6  . sodium chloride  3 mL Intravenous Q12H  . thiamine  100 mg Oral Daily   Or  . thiamine  100 mg Intravenous Daily  . vancomycin  750 mg Intravenous Q18H  . Warfarin - Pharmacist Dosing Inpatient   Does not apply Q24H    Continuous Infusions: . diltiazem (CARDIZEM) infusion 15 mg/hr (12/01/13 0600)  . propofol 40 mcg/kg/min (12/01/13 0600)    Past Medical  History  Diagnosis Date  . Essential hypertension, benign   . Coronary atherosclerosis of native coronary artery     a. CABG/MVR/AVR-2003 (#25 St. Jude/#21 St. Jude); anticoagulation; b. negative stress nuclear-2005;  c. 01/2013 NSTEMI/Cath/PCI: LM nl, LAD 50p, 40-41m, D1 50ost, LCX 27m, RCA 50/69m (2.5x20 Promus DES), 50d, VG->Diag 100, VG->PDA 100, VG->OM 100, LIMA->LAD ok.  . Epistaxis     Requiring cautery & aterial ligation-09/2009  . GERD (gastroesophageal reflux disease)   . Hyperlipemia   . Abnormal LFTs     Possible cirrhosis  . Chronic anticoagulation   . Valvular heart disease     a. 2003: MVR/AVR-2003 (#25 St. Jude/#21 St. Jude);  b. 01/2013 Echo: EF 55%, Mech AVR mean grad 12, Mech MVR mean grad 6.  . Tobacco abuse     45 pack years  . Fasting hyperglycemia   . Nephrolithiasis   . Diverticulosis   . Hyponatremia   . Chronic diastolic CHF (congestive heart failure)   . Chronic renal disease   . Hemolytic anemia   . ETOH abuse   . Atrial fibrillation 01/16/2013    On coumadin DCCV 07/2013.   Marland Kitchen COPD (chronic obstructive pulmonary disease)     Past Surgical History  Procedure Laterality Date  . Endocopic sphenopalatine artery ligation & cautry    . Inguinal hernia repair      Left & right  . Coronary artery bypass graft  11/2001    [facility]  . Cardiac valve replacement  11/2001    AVR and MVR-St. Jude devices  . Cataract extraction w/phaco  01/20/2011    Procedure: CATARACT EXTRACTION PHACO AND INTRAOCULAR LENS PLACEMENT (IOC);  Surgeon: Elta Guadeloupe T. Gershon Crane;  Location: [facility];  Service: Ophthalmology;  Laterality: Right;  CDE: 8.51  . Cataract extraction w/phaco  02/03/2011    Procedure: CATARACT EXTRACTION PHACO AND INTRAOCULAR LENS PLACEMENT (IOC);  Surgeon: Elta Guadeloupe T. Gershon Crane;  Location: [facility];  Service: Ophthalmology;  Laterality: Left;  CDE:10.01  . Colonoscopy  04/05/02; 08/2011    friable anal canal hemorrhoids otherwise normal; 2 diminutive polyps excised,  minimal diverticulosis noted  . Esophagogastroduodenoscopy  01/2002    Dr. Laural Golden, submucosal esophageal lesion c/w leiomyoma  . Colonoscopy  09/02/2011    Procedure: COLONOSCOPY;  Surgeon: Daneil Dolin, MD;  Location: [facility];  Service: Endoscopy;  Laterality: N/A;  8:15  . Yag laser application Right [id number]    Procedure: YAG LASER APPLICATION;  Surgeon: Elta Guadeloupe T. Gershon Crane, MD;  Location: [facility];  Service:  Ophthalmology;  Laterality: Right;    Colman Cater MS,RD,CSG,LDN Office: [phone] Pager: [phone]

## 2013-12-01 NOTE — Progress Notes (Signed)
Primary cardiologist: Dr. Carlyle Dolly Consulting cardiologist: Dr. Satira Sark  Subjective:   Sedated on the ventilator. Overnight events noted.   Objective:   Temp:  [98.7 F (37.1 C)-99.7 F (37.6 C)] 99.5 F (37.5 C) (05/29 0800) Pulse Rate:  [65-144] 85 (05/29 1152) Resp:  [12-16] 12 (05/29 1152) BP: (107-140)/(56-94) 112/65 mmHg (05/29 0800) SpO2:  [94 %-100 %] 95 % (05/29 1152) FiO2 (%):  [50 %] 50 % (05/29 1152) Weight:  [151 lb 10.8 oz (68.8 kg)] 151 lb 10.8 oz (68.8 kg) (05/29 0600) Last BM Date: 11/24/13  Filed Weights   11/29/13 0500 11/30/13 0500 12/01/13 0600  Weight: 162 lb 14.7 oz (73.9 kg) 155 lb 3.3 oz (70.4 kg) 151 lb 10.8 oz (68.8 kg)    Intake/Output Summary (Last 24 hours) at 12/01/13 1210 Last data filed at 12/01/13 0600  Gross per 24 hour  Intake 931.57 ml  Output   2950 ml  Net -2018.43 ml    Telemetry: Sinus rhythm with bursts of SVT/AF.  Exam:  General: Chronically ill-appearing, sedated.  Lungs: Clear anteriorly, decreased breath sounds at the bases.  Cardiac: RRR with ectopy, no gallop.  Abdomen: No guarding, decreased bowel sounds.  Extremities: No pitting.   Lab Results:  Basic Metabolic Panel:  Recent Labs Lab 11/26/13 1826  11/29/13 0413 11/30/13 0406 12/01/13 0121  NA 125*  < > 137 140 141  K 4.5  < > 4.0 3.2* 3.5*  CL 85*  < > 92* 91* 89*  CO2 23  < > 34* 39* 40*  GLUCOSE 120*  < > 177* 175* 153*  BUN 30*  < > 32* 32* 34*  CREATININE 1.38*  < > 1.21 1.33 1.42*  CALCIUM 8.3*  < > 9.0 8.9 9.0  MG 1.7  --   --   --   --   < > = values in this interval not displayed.  CBC:  Recent Labs Lab 11/26/13 1826 11/30/13 0406 12/01/13 0121  WBC 6.7 7.6 7.7  HGB 9.2* 9.3* 9.9*  HCT 29.7* 30.0* 32.8*  MCV 91.1 92.6 94.0  PLT 312 238 273    Cardiac Enzymes:  Recent Labs Lab 11/27/13 0626 11/27/13 1230 12/01/13 0738  TROPONINI <0.30 <0.30 0.90*    Coagulation:  Recent Labs Lab  11/29/13 0413 11/30/13 0406 12/01/13 0121  INR 3.55* 4.98* 4.47*     Medications:   Scheduled Medications: . antiseptic oral rinse  15 mL Mouth Rinse QID  . chlorhexidine  15 mL Mouth Rinse BID  . feeding supplement (PRO-STAT SUGAR FREE 64)  30 mL Per Tube BID  . feeding supplement (VITAL HIGH PROTEIN)  1,000 mL Per Tube Q24H  . furosemide  80 mg Intravenous Q12H  . insulin aspart  5 Units Subcutaneous 6 times per day  . ipratropium-albuterol  3 mL Nebulization Q4H  . methylPREDNISolone (SOLU-MEDROL) injection  60 mg Intravenous Q6H  . metoprolol tartrate  50 mg Per Tube BID  . nicotine  14 mg Transdermal Daily  . pantoprazole (PROTONIX) IV  40 mg Intravenous Daily  . piperacillin-tazobactam (ZOSYN)  IV  3.375 g Intravenous Q8H  . potassium chloride  10 mEq Intravenous Q1 Hr x 6  . sodium chloride  3 mL Intravenous Q12H  . thiamine  100 mg Oral Daily   Or  . thiamine  100 mg Intravenous Daily  . vancomycin  750 mg Intravenous Q18H  . Warfarin - Pharmacist Dosing Inpatient   Does not  apply Q24H    Infusions: . diltiazem (CARDIZEM) infusion 15 mg/hr (12/01/13 0600)  . propofol 40.017 mcg/kg/min (12/01/13 1033)    PRN Medications: sodium chloride, acetaminophen, albuterol, midazolam, sodium chloride   Assessment:   1. Hypoxic respiratory failure, currently requiring mechanical ventilation for support. Multifactorial in the setting of oxygen requiring COPD and diastolic heart failure. He continues to diurese vigorously on IV Lasix. Weight down nearly 20 pounds if measurements are accurate. Creatinine has bumped up from 1.3-1.4 since yesterday.  2. EEG without obvious epileptiform discharges. Seizure doubted at this time.  3. Multivessel CAD status post CABG as outlined above. He did have a mild troponin I bump in the setting of recurrent rapid atrial fibrillation. Probable ischemic ST changes noted with rapid rates.  4. 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 Last echocardiogram was in January of this year. On Coumadin chronically. INR supratherapeutic.  5. History of paroxysmal atrial fibrillation. Recurrence during hospitalization, rapid rates overnight. This morning patient is in sinus rhythm with PACs on diltiazem infusion.  6. Alcohol abuse.   7. Acute on chronic diastolic heart failure, most recent LVEF 50-55%.   Plan/Discussion:    Patient back on Lopressor per tube, also diltiazem infusion. Heart rate is controlled this morning in sinus rhythm. Cut back Lasix dosing given substantial diuresis and also starting to see bump in creatinine. Still with some pulmonary edema and effusions or chest x-ray. Coumadin per pharmacy.   Satira Sark, M.D., F.A.C.C.

## 2013-12-01 NOTE — Progress Notes (Signed)
eLink Physician-Brief Progress Note Patient Name: [name] DOB: 1946/02/21 MRN: [id number]  Date of Service  12/01/2013   HPI/Events of Note  HR improved but stil between 120-144  Despite cardizem gtt and lopressor prn    Recent Labs Lab 11/26/13 1826 11/27/13 0033 11/27/13 0626 11/27/13 1230  TROPONINI <0.30 <0.30 <0.30 <0.30    Recent Labs Lab 11/27/13 0626 11/28/13 0412 11/29/13 0413 11/30/13 0406 12/01/13 0121  CREATININE 1.36* 1.28 1.21 1.33 1.42*      Recent Labs Lab 11/26/13 1826  PROBNP 12192.0*   Echo jan 2015  - ef 55%  eICU Interventions  Try fluid bolus 500cc   Intervention Category Intermediate Interventions: Arrhythmia - evaluation and management  [name] 12/01/2013, 4:25 AM

## 2013-12-01 NOTE — Progress Notes (Signed)
CRITICAL VALUE ALERT  Critical value received:  Troponin 0.9  Date of notification: 12/01/13  Time of notification:  0822  Critical value read back:yes  Nurse who received alert:  Girard Cooter RN  MD notified (1st page):  Dr Luan Pulling  Time of first page:  [phone]  MD notified (2nd page):  Time of second page:  Responding MD:  Dr Luan Pulling  Time MD responded:  [phone]

## 2013-12-02 ENCOUNTER — Inpatient Hospital Stay (HOSPITAL_COMMUNITY): Payer: Medicare Other

## 2013-12-02 LAB — BASIC METABOLIC PANEL
BUN: 51 mg/dL — ABNORMAL HIGH (ref 6–23)
CALCIUM: 8.9 mg/dL (ref 8.4–10.5)
CO2: 40 meq/L — AB (ref 19–32)
Chloride: 94 mEq/L — ABNORMAL LOW (ref 96–112)
Creatinine, Ser: 1.46 mg/dL — ABNORMAL HIGH (ref 0.50–1.35)
GFR calc non Af Amer: 48 mL/min — ABNORMAL LOW (ref >90)
GFR, EST AFRICAN AMERICAN: 55 mL/min — AB (ref >90)
Glucose, Bld: 122 mg/dL — ABNORMAL HIGH (ref 70–99)
Potassium: 3.5 mEq/L — ABNORMAL LOW (ref 3.7–5.3)
SODIUM: 144 meq/L (ref 137–147)

## 2013-12-02 LAB — CBC WITH DIFFERENTIAL/PLATELET
Basophils Absolute: 0 10*3/uL (ref 0.0–0.1)
Basophils Relative: 0 % (ref 0–1)
Eosinophils Absolute: 0 10*3/uL (ref 0.0–0.7)
Eosinophils Relative: 0 % (ref 0–5)
HCT: 32.9 % — ABNORMAL LOW (ref 39.0–52.0)
Hemoglobin: 9.9 g/dL — ABNORMAL LOW (ref 13.0–17.0)
LYMPHS ABS: 0.1 10*3/uL — AB (ref 0.7–4.0)
LYMPHS PCT: 1 % — AB (ref 12–46)
MCH: 28.6 pg (ref 26.0–34.0)
MCHC: 30.1 g/dL (ref 30.0–36.0)
MCV: 95.1 fL (ref 78.0–100.0)
Monocytes Absolute: 0.4 10*3/uL (ref 0.1–1.0)
Monocytes Relative: 5 % (ref 3–12)
NEUTROS ABS: 7.1 10*3/uL (ref 1.7–7.7)
NEUTROS PCT: 94 % — AB (ref 43–77)
PLATELETS: 267 10*3/uL (ref 150–400)
RBC: 3.46 MIL/uL — AB (ref 4.22–5.81)
RDW: 16.3 % — ABNORMAL HIGH (ref 11.5–15.5)
WBC: 7.6 10*3/uL (ref 4.0–10.5)

## 2013-12-02 LAB — GLUCOSE, CAPILLARY
GLUCOSE-CAPILLARY: 123 mg/dL — AB (ref 70–99)
GLUCOSE-CAPILLARY: 148 mg/dL — AB (ref 70–99)
GLUCOSE-CAPILLARY: 152 mg/dL — AB (ref 70–99)
GLUCOSE-CAPILLARY: 153 mg/dL — AB (ref 70–99)
Glucose-Capillary: 138 mg/dL — ABNORMAL HIGH (ref 70–99)
Glucose-Capillary: 175 mg/dL — ABNORMAL HIGH (ref 70–99)

## 2013-12-02 LAB — PROTIME-INR
INR: 2.31 — AB (ref 0.00–1.49)
PROTHROMBIN TIME: 24.6 s — AB (ref 11.6–15.2)

## 2013-12-02 MED ORDER — WARFARIN SODIUM 5 MG PO TABS
5.0000 mg | ORAL_TABLET | Freq: Once | ORAL | Status: AC
  Administered 2013-12-02: 5 mg
  Filled 2013-12-02: qty 1

## 2013-12-02 MED ORDER — FENTANYL CITRATE 0.05 MG/ML IJ SOLN
25.0000 ug | INTRAMUSCULAR | Status: DC | PRN
  Administered 2013-12-03 (×2): 25 ug via INTRAVENOUS
  Filled 2013-12-02 (×2): qty 2

## 2013-12-02 MED ORDER — INSULIN ASPART 100 UNIT/ML ~~LOC~~ SOLN
0.0000 [IU] | Freq: Three times a day (TID) | SUBCUTANEOUS | Status: DC
  Administered 2013-12-02 – 2013-12-04 (×4): 2 [IU] via SUBCUTANEOUS
  Administered 2013-12-04: 1 [IU] via SUBCUTANEOUS
  Administered 2013-12-04 – 2013-12-06 (×3): 2 [IU] via SUBCUTANEOUS

## 2013-12-02 NOTE — Progress Notes (Signed)
ANTICOAGULATION CONSULT NOTE  Pharmacy Consult for Coumadin  Indication: Afib + St Jude's AVR/MVR   No Known Allergies  Patient Measurements: Height: 5\' 7"  (170.2 cm) Weight: 152 lb 1.9 oz (69 kg) IBW/kg (Calculated) : 66.1  Vital Signs: Temp: 99.6 F (37.6 C) (05/30 0800) Temp src: Axillary (05/30 0800) BP: 125/73 mmHg (05/30 0800) Pulse Rate: 130 (05/30 0800)  Labs:  Recent Labs  11/30/13 0406 12/01/13 0121 12/01/13 0738 12/01/13 1322 12/01/13 1858 12/02/13 0524  HGB 9.3* 9.9*  --   --   --  9.9*  HCT 30.0* 32.8*  --   --   --  32.9*  PLT 238 273  --   --   --  267  LABPROT 44.3* 40.8*  --   --   --  24.6*  INR 4.98* 4.47*  --   --   --  2.31*  CREATININE 1.33 1.42*  --   --   --  1.46*  TROPONINI  --   --  0.90* 1.04* 0.84*  --    Estimated Creatinine Clearance: 45.3 ml/min (by C-G formula based on Cr of 1.46).  Medical History: Past Medical History  Diagnosis Date  . Essential hypertension, benign   . Coronary atherosclerosis of native coronary artery     a. CABG/MVR/AVR-2003 (#25 St. Jude/#21 St. Jude); anticoagulation; b. negative stress nuclear-2005;  c. 01/2013 NSTEMI/Cath/PCI: LM nl, LAD 50p, 40-54m, D1 50ost, LCX 48m, RCA 50/67m (2.5x20 Promus DES), 50d, VG->Diag 100, VG->PDA 100, VG->OM 100, LIMA->LAD ok.  . Epistaxis     Requiring cautery & aterial ligation-09/2009  . GERD (gastroesophageal reflux disease)   . Hyperlipemia   . Abnormal LFTs     Possible cirrhosis  . Chronic anticoagulation   . Valvular heart disease     a. 2003: MVR/AVR-2003 (#25 St. Jude/#21 St. Jude);  b. 01/2013 Echo: EF 55%, Mech AVR mean grad 12, Mech MVR mean grad 6.  . Tobacco abuse     45 pack years  . Fasting hyperglycemia   . Nephrolithiasis   . Diverticulosis   . Hyponatremia   . Chronic diastolic CHF (congestive heart failure)   . Chronic renal disease   . Hemolytic anemia   . ETOH abuse   . Atrial fibrillation 01/16/2013    On coumadin DCCV 07/2013.   Marland Kitchen COPD  (chronic obstructive pulmonary disease)    Medications:  Prescriptions prior to admission  Medication Sig Dispense Refill  . amLODipine (NORVASC) 10 MG tablet Take 10 mg by mouth at bedtime.      Marland Kitchen atorvastatin (LIPITOR) 40 MG tablet Take 40 mg by mouth daily.      . clopidogrel (PLAVIX) 75 MG tablet Take 1 tablet (75 mg total) by mouth daily with breakfast.  30 tablet  6  . ferrous sulfate 325 (65 FE) MG EC tablet Take 325 mg by mouth daily with breakfast.      . folic acid (FOLVITE) 1 MG tablet Take 1 tablet (1 mg total) by mouth daily.  30 tablet  4  . levalbuterol (XOPENEX) 0.63 MG/3ML nebulizer solution Take 3 mLs (0.63 mg total) by nebulization every 6 (six) hours as needed for wheezing or shortness of breath.  3 mL  4  . magnesium oxide (MAG-OX) 400 (241.3 MG) MG tablet Take 1 tablet (400 mg total) by mouth daily.  30 tablet  6  . metoprolol (LOPRESSOR) 100 MG tablet Take 1 tablet (100 mg total) by mouth 2 (two) times daily.  [place]  tablet  6  . Multiple Vitamin (MULTIVITAMIN WITH MINERALS) TABS Take 1 tablet by mouth daily.      . pantoprazole (PROTONIX) 40 MG tablet Take 1 tablet (40 mg total) by mouth daily.  30 tablet  0  . potassium chloride SA (K-DUR,KLOR-CON) 20 MEQ tablet Take 20 mEq by mouth daily.      Marland Kitchen thiamine 100 MG tablet Take 1 tablet (100 mg total) by mouth daily.  30 tablet  4  . torsemide (DEMADEX) 20 MG tablet Take 2 tablets (40 mg total) by mouth 2 (two) times daily.  60 tablet  6  . warfarin (COUMADIN) 5 MG tablet Take 1 1/2 tablets (7.5 mg) on Mondays and  Fridays  take 1 tablet (5 mg) on Sunday, Tuesday, Wednesday Thursday and Saturday      . nitroGLYCERIN (NITROSTAT) 0.4 MG SL tablet Place 1 tablet (0.4 mg total) under the tongue every 5 (five) minutes x 3 doses as needed for chest pain.  25 tablet  3   Assessment: 68yo male on chronic Coumadin PTA for h/o mitral and aortic valve replacement + AFib.  Recorded home dose (listed above) varies slightly from outpatient  anticoag flow-sheet which states 5mg  daily except 7.5mg  on Mon.  Will need to clarify with patient once able.    INR was therapeutic on admission, but trended above goal range & dose held x 2 days.  INR now sub-therapeutic.  Pt remains sedated on ventilator.   No bleeding noted.    Goal of Therapy:  INR 2.5 - 3.5    Plan:   Coumadin 5mg  per tube x1 today    INR daily  Clarify outpatient Coumadin regimen with patient when able  Lavonia Drafts Ruxin Ransome 12/02/2013,8:42 AM

## 2013-12-02 NOTE — Progress Notes (Signed)
Subjective: He remains intubated sedated and on the ventilator. He was able to come off diltiazem briefly yesterday but is now back in atrial fibrillation with rapid ventricular response and on IV diltiazem again. No other new problems have been noted.  Objective: Vital signs in last 24 hours: Temp:  [99.4 F (37.4 C)-100.4 F (38 C)] 99.6 F (37.6 C) (05/30 0800) Pulse Rate:  [62-178] 130 (05/30 0800) Resp:  [9-18] 12 (05/30 0756) BP: (115-158)/(45-93) 125/73 mmHg (05/30 0800) SpO2:  [88 %-96 %] 91 % (05/30 0800) FiO2 (%):  [40 %-50 %] 45 % (05/30 0756) Weight:  [69 kg (152 lb 1.9 oz)] 69 kg (152 lb 1.9 oz) (05/30 0500) Weight change: 0.2 kg (7.1 oz) Last BM Date: 11/24/13  Intake/Output from previous day: 05/29 0701 - 05/30 0700 In: 2467.2 [I.V.:1033.9; NG/GT:433.3; IV Piggyback:1000] Out: 2750 [Urine:2750]  PHYSICAL EXAM General appearance: no distress and Intubated and sedated Resp: rhonchi bilaterally Cardio: He is in atrial fibrillation with a heart rate of about 120. He has prosthetic heart valve sounds. GI: soft, non-tender; bowel sounds normal; no masses,  no organomegaly Extremities: extremities normal, atraumatic, no cyanosis or edema  Lab Results:    Basic Metabolic Panel:  Recent Labs  12/01/13 0121 12/02/13 0524  NA 141 144  K 3.5* 3.5*  CL 89* 94*  CO2 40* 40*  GLUCOSE 153* 122*  BUN 34* 51*  CREATININE 1.42* 1.46*  CALCIUM 9.0 8.9   Liver Function Tests:  Recent Labs  11/30/13 0406  AST 30  ALT 10  ALKPHOS 92  BILITOT 0.8  PROT 6.3  ALBUMIN 2.9*   No results found for this basename: LIPASE, AMYLASE,  in the last 72 hours No results found for this basename: AMMONIA,  in the last 72 hours CBC:  Recent Labs  12/01/13 0121 12/02/13 0524  WBC 7.7 7.6  NEUTROABS 7.2 7.1  HGB 9.9* 9.9*  HCT 32.8* 32.9*  MCV 94.0 95.1  PLT 273 267   Cardiac Enzymes:  Recent Labs  12/01/13 0738 12/01/13 1322 12/01/13 1858  TROPONINI 0.90* 1.04*  0.84*   BNP: No results found for this basename: PROBNP,  in the last 72 hours D-Dimer: No results found for this basename: DDIMER,  in the last 72 hours CBG:  Recent Labs  12/01/13 1125 12/01/13 1615 12/01/13 2012 12/02/13 0027 12/02/13 0346 12/02/13 0732  GLUCAP 166* 145* 138* 148* 123* 138*   Hemoglobin A1C: No results found for this basename: HGBA1C,  in the last 72 hours Fasting Lipid Panel:  Recent Labs  12/01/13 0738  TRIG 104   Thyroid Function Tests: No results found for this basename: TSH, T4TOTAL, FREET4, T3FREE, THYROIDAB,  in the last 72 hours Anemia Panel: No results found for this basename: VITAMINB12, FOLATE, FERRITIN, TIBC, IRON, RETICCTPCT,  in the last 72 hours Coagulation:  Recent Labs  12/01/13 0121 12/02/13 0524  LABPROT 40.8* 24.6*  INR 4.47* 2.31*   Urine Drug Screen: Drugs of Abuse  No results found for this basename: labopia, cocainscrnur, labbenz, amphetmu, thcu, labbarb    Alcohol Level: No results found for this basename: ETH,  in the last 72 hours Urinalysis: No results found for this basename: COLORURINE, APPERANCEUR, LABSPEC, PHURINE, GLUCOSEU, HGBUR, BILIRUBINUR, KETONESUR, PROTEINUR, UROBILINOGEN, NITRITE, LEUKOCYTESUR,  in the last 72 hours Misc. Labs:  ABGS  Recent Labs  12/02/13 0542  PHART 7.487*  PO2ART 73.3*  TCO2 37.6  HCO3 40.9*   CULTURES Recent Results (from the past 240 hour(s))  MRSA  PCR SCREENING     Status: None   Collection Time    11/26/13  9:40 PM      Result Value Ref Range Status   MRSA by PCR NEGATIVE  NEGATIVE Final   Comment:            The GeneXpert MRSA Assay (FDA     approved for NASAL specimens     only), is one component of a     comprehensive MRSA colonization     surveillance program. It is not     intended to diagnose MRSA     infection nor to guide or     monitor treatment for     MRSA infections.   Studies/Results: Dg Chest Port 1 View  12/02/2013   CLINICAL DATA:   Followup chest radiograph  EXAM: PORTABLE CHEST - 1 VIEW  COMPARISON:  12/01/2013; 11/30/2013; 11/29/2013  FINDINGS: Grossly unchanged enlarged cardiac silhouette and mediastinal contours post median sternotomy and valve replacement. Stable positioning of support apparatus. The pulmonary vasculature remains indistinct with cephalization of flow. Grossly unchanged layering small to moderate sized bilateral effusions with associated bilateral mid and lower lung heterogeneous/consolidative opacities, left greater than right. No new focal airspace opacities. No pneumothorax. Unchanged bones.  IMPRESSION: 1. Stable positioning of support apparatus.  No pneumothorax. 2. Grossly unchanged findings of edema with layering small to moderate sized bilateral effusions and associated bibasilar opacities, atelectasis versus infiltrate.   Electronically Signed   By: Sandi Mariscal M.D.   On: 12/02/2013 08:26   Dg Chest Port 1 View  12/01/2013   CLINICAL DATA:  Respiratory failure  EXAM: PORTABLE CHEST - 1 VIEW  COMPARISON:  Yesterday  FINDINGS: Endotracheal tube and NG tube are stable. Pulmonary edema and bilateral pleural effusions are stable. No pneumothorax. Moderate cardiomegaly.  IMPRESSION: Stable CHF.   Electronically Signed   By: Maryclare Bean M.D.   On: 12/01/2013 08:03   Dg Chest Port 1 View  11/30/2013   CLINICAL DATA:  Respiratory failure.  EXAM: PORTABLE CHEST - 1 VIEW  COMPARISON:  11/29/2013, 11/28/2013, and 11/26/2013  FINDINGS: Endotracheal tube tip is 4 cm above the carina in good position. NG tube tip is below the diaphragm. Bilateral pulmonary edema has improved. There are moderate bilateral pleural effusions which are essentially unchanged. Persistent cardiomegaly and pulmonary vascular congestion.  IMPRESSION: Improving congestive heart failure.   Electronically Signed   By: Rozetta Nunnery M.D.   On: 11/30/2013 09:49    Medications:  Prior to Admission:  Prescriptions prior to admission  Medication Sig  Dispense Refill  . amLODipine (NORVASC) 10 MG tablet Take 10 mg by mouth at bedtime.      Marland Kitchen atorvastatin (LIPITOR) 40 MG tablet Take 40 mg by mouth daily.      . clopidogrel (PLAVIX) 75 MG tablet Take 1 tablet (75 mg total) by mouth daily with breakfast.  30 tablet  6  . ferrous sulfate 325 (65 FE) MG EC tablet Take 325 mg by mouth daily with breakfast.      . folic acid (FOLVITE) 1 MG tablet Take 1 tablet (1 mg total) by mouth daily.  30 tablet  4  . levalbuterol (XOPENEX) 0.63 MG/3ML nebulizer solution Take 3 mLs (0.63 mg total) by nebulization every 6 (six) hours as needed for wheezing or shortness of breath.  3 mL  4  . magnesium oxide (MAG-OX) 400 (241.3 MG) MG tablet Take 1 tablet (400 mg total) by mouth daily.  30 tablet  6  . metoprolol (LOPRESSOR) 100 MG tablet Take 1 tablet (100 mg total) by mouth 2 (two) times daily.  60 tablet  6  . Multiple Vitamin (MULTIVITAMIN WITH MINERALS) TABS Take 1 tablet by mouth daily.      . pantoprazole (PROTONIX) 40 MG tablet Take 1 tablet (40 mg total) by mouth daily.  30 tablet  0  . potassium chloride SA (K-DUR,KLOR-CON) 20 MEQ tablet Take 20 mEq by mouth daily.      Marland Kitchen thiamine 100 MG tablet Take 1 tablet (100 mg total) by mouth daily.  30 tablet  4  . torsemide (DEMADEX) 20 MG tablet Take 2 tablets (40 mg total) by mouth 2 (two) times daily.  60 tablet  6  . warfarin (COUMADIN) 5 MG tablet Take 1 1/2 tablets (7.5 mg) on Mondays and  Fridays  take 1 tablet (5 mg) on Sunday, Tuesday, Wednesday Thursday and Saturday      . nitroGLYCERIN (NITROSTAT) 0.4 MG SL tablet Place 1 tablet (0.4 mg total) under the tongue every 5 (five) minutes x 3 doses as needed for chest pain.  25 tablet  3   Scheduled: . antiseptic oral rinse  15 mL Mouth Rinse QID  . chlorhexidine  15 mL Mouth Rinse BID  . feeding supplement (PRO-STAT SUGAR FREE 64)  30 mL Per Tube BID  . feeding supplement (VITAL HIGH PROTEIN)  1,000 mL Per Tube Q24H  . furosemide  60 mg Intravenous Q12H   . insulin aspart  5 Units Subcutaneous 6 times per day  . ipratropium-albuterol  3 mL Nebulization Q4H  . methylPREDNISolone (SOLU-MEDROL) injection  60 mg Intravenous Q6H  . metoprolol tartrate  50 mg Per Tube BID  . nicotine  14 mg Transdermal Daily  . pantoprazole (PROTONIX) IV  40 mg Intravenous Daily  . piperacillin-tazobactam (ZOSYN)  IV  3.375 g Intravenous Q8H  . sodium chloride  3 mL Intravenous Q12H  . thiamine  100 mg Oral Daily   Or  . thiamine  100 mg Intravenous Daily  . vancomycin  750 mg Intravenous Q18H  . warfarin  5 mg Per Tube Once  . Warfarin - Pharmacist Dosing Inpatient   Does not apply Q24H   Continuous: . diltiazem (CARDIZEM) infusion 10 mg/hr (12/02/13 0800)  . propofol 50 mcg/kg/min (12/02/13 0829)   MGQ:QPYPPJ chloride, acetaminophen, albuterol, midazolam, sodium chloride  Assesment: He was admitted with jerking that may have been some form of a seizure. It seemed to be related to hypoxia. After admission he developed increasing problems with breathing and eventually this culminated in him having to be intubated and placed on mechanical ventilation. This appeared to be mostly from pulmonary edema from acute on chronic CHF.  He has atrial fibrillation with rapid ventricular response and I need to get that under control before I can do anything about weaning.  He has severe COPD at baseline. He is intubated.  He is chronically anticoagulated and his INR has been supratherapeutic  He had both aortic and mitral valve replacements years ago.  He has coronary artery occlusive disease and did have some elevation of his troponin related to the rapid atrial fibrillation Principal Problem:   Seizure-like activity Active Problems:   ANEMIA   TOBACCO ABUSE   CHRONIC OBSTRUCTIVE PULMONARY DISEASE   MITRAL VALVE REPLACEMENT, HX OF   AORTIC VALVE REPLACEMENT, HX OF   Chronic anticoagulation   Coronary atherosclerosis of unspecified type of vessel, native or  graft   Acute on  chronic diastolic CHF (congestive heart failure), NYHA class 4   Hyponatremia   Atrial fibrillation status post cardioversion   Alcohol abuse    Plan: Continue his IV diltiazem for now. He could be switched to oral once his heart rate is well controlled. No attempt at weaning today. Continue tube feedings    LOS: 6 days   Alonza Bogus 12/02/2013, 8:49 AM

## 2013-12-03 ENCOUNTER — Inpatient Hospital Stay (HOSPITAL_COMMUNITY): Payer: Medicare Other

## 2013-12-03 DIAGNOSIS — J9620 Acute and chronic respiratory failure, unspecified whether with hypoxia or hypercapnia: Secondary | ICD-10-CM | POA: Diagnosis present

## 2013-12-03 DIAGNOSIS — E876 Hypokalemia: Secondary | ICD-10-CM | POA: Diagnosis not present

## 2013-12-03 DIAGNOSIS — I4891 Unspecified atrial fibrillation: Secondary | ICD-10-CM | POA: Diagnosis not present

## 2013-12-03 LAB — BLOOD GAS, ARTERIAL
ACID-BASE EXCESS: 14.7 mmol/L — AB (ref 0.0–2.0)
Bicarbonate: 39.8 mEq/L — ABNORMAL HIGH (ref 20.0–24.0)
Drawn by: 22223
FIO2: 40 %
LHR: 12 {breaths}/min
O2 Saturation: 96.7 %
PATIENT TEMPERATURE: 37
PCO2 ART: 58.6 mmHg — AB (ref 35.0–45.0)
PEEP: 5 cmH2O
PH ART: 7.446 (ref 7.350–7.450)
TCO2: 36.5 mmol/L (ref 0–100)
VT: 550 mL
pO2, Arterial: 92.7 mmHg (ref 80.0–100.0)

## 2013-12-03 LAB — COMPREHENSIVE METABOLIC PANEL
ALT: 16 U/L (ref 0–53)
AST: 29 U/L (ref 0–37)
Albumin: 3.1 g/dL — ABNORMAL LOW (ref 3.5–5.2)
Alkaline Phosphatase: 69 U/L (ref 39–117)
BILIRUBIN TOTAL: 0.6 mg/dL (ref 0.3–1.2)
BUN: 65 mg/dL — AB (ref 6–23)
CHLORIDE: 95 meq/L — AB (ref 96–112)
CO2: 39 meq/L — AB (ref 19–32)
Calcium: 8.9 mg/dL (ref 8.4–10.5)
Creatinine, Ser: 1.29 mg/dL (ref 0.50–1.35)
GFR calc Af Amer: 64 mL/min — ABNORMAL LOW (ref >90)
GFR, EST NON AFRICAN AMERICAN: 55 mL/min — AB (ref >90)
GLUCOSE: 189 mg/dL — AB (ref 70–99)
Potassium: 2.9 mEq/L — CL (ref 3.7–5.3)
Sodium: 145 mEq/L (ref 137–147)
Total Protein: 6.2 g/dL (ref 6.0–8.3)

## 2013-12-03 LAB — CBC WITH DIFFERENTIAL/PLATELET
BASOS ABS: 0 10*3/uL (ref 0.0–0.1)
Basophils Relative: 0 % (ref 0–1)
EOS ABS: 0 10*3/uL (ref 0.0–0.7)
Eosinophils Relative: 0 % (ref 0–5)
HCT: 34.6 % — ABNORMAL LOW (ref 39.0–52.0)
Hemoglobin: 10.5 g/dL — ABNORMAL LOW (ref 13.0–17.0)
LYMPHS ABS: 0.2 10*3/uL — AB (ref 0.7–4.0)
LYMPHS PCT: 2 % — AB (ref 12–46)
MCH: 28.8 pg (ref 26.0–34.0)
MCHC: 30.3 g/dL (ref 30.0–36.0)
MCV: 95.1 fL (ref 78.0–100.0)
Monocytes Absolute: 0.1 10*3/uL (ref 0.1–1.0)
Monocytes Relative: 2 % — ABNORMAL LOW (ref 3–12)
NEUTROS PCT: 96 % — AB (ref 43–77)
Neutro Abs: 6.9 10*3/uL (ref 1.7–7.7)
PLATELETS: 235 10*3/uL (ref 150–400)
RBC: 3.64 MIL/uL — ABNORMAL LOW (ref 4.22–5.81)
RDW: 16 % — ABNORMAL HIGH (ref 11.5–15.5)
WBC: 7.2 10*3/uL (ref 4.0–10.5)

## 2013-12-03 LAB — PROTIME-INR
INR: 1.69 — ABNORMAL HIGH (ref 0.00–1.49)
Prothrombin Time: 19.4 seconds — ABNORMAL HIGH (ref 11.6–15.2)

## 2013-12-03 LAB — GLUCOSE, CAPILLARY
Glucose-Capillary: 183 mg/dL — ABNORMAL HIGH (ref 70–99)
Glucose-Capillary: 156 mg/dL — ABNORMAL HIGH (ref 70–99)

## 2013-12-03 MED ORDER — METHYLPREDNISOLONE SODIUM SUCC 40 MG IJ SOLR
40.0000 mg | Freq: Two times a day (BID) | INTRAMUSCULAR | Status: DC
  Administered 2013-12-03 – 2013-12-04 (×4): 40 mg via INTRAVENOUS
  Filled 2013-12-03 (×4): qty 1

## 2013-12-03 MED ORDER — LORAZEPAM 0.5 MG PO TABS: 0.5000 mg | ORAL_TABLET | Freq: Three times a day (TID) | ORAL | Status: DC

## 2013-12-03 MED ORDER — IPRATROPIUM-ALBUTEROL 0.5-2.5 (3) MG/3ML IN SOLN
3.0000 mL | Freq: Three times a day (TID) | RESPIRATORY_TRACT | Status: DC
  Administered 2013-12-03 – 2013-12-06 (×9): 3 mL via RESPIRATORY_TRACT
  Filled 2013-12-03 (×10): qty 3

## 2013-12-03 MED ORDER — PRO-STAT SUGAR FREE PO LIQD
30.0000 mL | Freq: Two times a day (BID) | ORAL | Status: DC
  Administered 2013-12-03 – 2013-12-06 (×6): 30 mL via ORAL
  Filled 2013-12-03 (×6): qty 30

## 2013-12-03 MED ORDER — WARFARIN SODIUM 7.5 MG PO TABS
7.5000 mg | ORAL_TABLET | Freq: Once | ORAL | Status: AC
  Administered 2013-12-03: 7.5 mg
  Filled 2013-12-03: qty 1

## 2013-12-03 MED ORDER — ENSURE COMPLETE PO LIQD
237.0000 mL | Freq: Two times a day (BID) | ORAL | Status: DC
  Administered 2013-12-03 – 2013-12-05 (×5): 237 mL via ORAL

## 2013-12-03 MED ORDER — LORAZEPAM 0.5 MG PO TABS
0.5000 mg | ORAL_TABLET | Freq: Three times a day (TID) | ORAL | Status: DC | PRN
  Administered 2013-12-03 – 2013-12-05 (×3): 0.5 mg via ORAL
  Filled 2013-12-03 (×3): qty 1

## 2013-12-03 MED ORDER — DILTIAZEM HCL 60 MG PO TABS
60.0000 mg | ORAL_TABLET | Freq: Four times a day (QID) | ORAL | Status: DC
  Administered 2013-12-03 – 2013-12-04 (×5): 60 mg via ORAL
  Filled 2013-12-03 (×5): qty 1

## 2013-12-03 MED ORDER — POTASSIUM CHLORIDE 20 MEQ/15ML (10%) PO LIQD
40.0000 meq | ORAL | Status: AC
  Administered 2013-12-03 (×3): 40 meq
  Filled 2013-12-03 (×4): qty 30

## 2013-12-03 MED ORDER — POTASSIUM CHLORIDE 20 MEQ/15ML (10%) PO LIQD: 40.0000 meq | ORAL | Status: DC

## 2013-12-03 NOTE — Progress Notes (Signed)
ANTICOAGULATION CONSULT NOTE  Pharmacy Consult for Coumadin  Indication: Afib + St Jude's AVR/MVR   No Known Allergies  Patient Measurements: Height: 5\' 7"  (170.2 cm) Weight: 147 lb 4.3 oz (66.8 kg) IBW/kg (Calculated) : 66.1  Vital Signs: Temp: 97.6 F (36.4 C) (05/31 0800) Temp src: Axillary (05/31 0800) BP: 153/61 mmHg (05/31 0800) Pulse Rate: 77 (05/31 0800)  Labs:  Recent Labs  12/01/13 0121 12/01/13 0738 12/01/13 1322 12/01/13 1858 12/02/13 0524 12/03/13 0454  HGB 9.9*  --   --   --  9.9* 10.5*  HCT 32.8*  --   --   --  32.9* 34.6*  PLT 273  --   --   --  267 235  LABPROT 40.8*  --   --   --  24.6* 19.4*  INR 4.47*  --   --   --  2.31* 1.69*  CREATININE 1.42*  --   --   --  1.46* 1.29  TROPONINI  --  0.90* 1.04* 0.84*  --   --    Estimated Creatinine Clearance: 51.2 ml/min (by C-G formula based on Cr of 1.29).  Medical History: Past Medical History  Diagnosis Date  . Essential hypertension, benign   . Coronary atherosclerosis of native coronary artery     a. CABG/MVR/AVR-2003 (#25 St. Jude/#21 St. Jude); anticoagulation; b. negative stress nuclear-2005;  c. 01/2013 NSTEMI/Cath/PCI: LM nl, LAD 50p, 40-3m, D1 50ost, LCX 73m, RCA 50/73m (2.5x20 Promus DES), 50d, VG->Diag 100, VG->PDA 100, VG->OM 100, LIMA->LAD ok.  . Epistaxis     Requiring cautery & aterial ligation-09/2009  . GERD (gastroesophageal reflux disease)   . Hyperlipemia   . Abnormal LFTs     Possible cirrhosis  . Chronic anticoagulation   . Valvular heart disease     a. 2003: MVR/AVR-2003 (#25 St. Jude/#21 St. Jude);  b. 01/2013 Echo: EF 55%, Mech AVR mean grad 12, Mech MVR mean grad 6.  . Tobacco abuse     45 pack years  . Fasting hyperglycemia   . Nephrolithiasis   . Diverticulosis   . Hyponatremia   . Chronic diastolic CHF (congestive heart failure)   . Chronic renal disease   . Hemolytic anemia   . ETOH abuse   . Atrial fibrillation 01/16/2013    On coumadin DCCV 07/2013.   Marland Kitchen COPD  (chronic obstructive pulmonary disease)    Medications:  Prescriptions prior to admission  Medication Sig Dispense Refill  . amLODipine (NORVASC) 10 MG tablet Take 10 mg by mouth at bedtime.      Marland Kitchen atorvastatin (LIPITOR) 40 MG tablet Take 40 mg by mouth daily.      . clopidogrel (PLAVIX) 75 MG tablet Take 1 tablet (75 mg total) by mouth daily with breakfast.  30 tablet  6  . ferrous sulfate 325 (65 FE) MG EC tablet Take 325 mg by mouth daily with breakfast.      . folic acid (FOLVITE) 1 MG tablet Take 1 tablet (1 mg total) by mouth daily.  30 tablet  4  . levalbuterol (XOPENEX) 0.63 MG/3ML nebulizer solution Take 3 mLs (0.63 mg total) by nebulization every 6 (six) hours as needed for wheezing or shortness of breath.  3 mL  4  . magnesium oxide (MAG-OX) 400 (241.3 MG) MG tablet Take 1 tablet (400 mg total) by mouth daily.  30 tablet  6  . metoprolol (LOPRESSOR) 100 MG tablet Take 1 tablet (100 mg total) by mouth 2 (two) times daily.  Abram  tablet  6  . Multiple Vitamin (MULTIVITAMIN WITH MINERALS) TABS Take 1 tablet by mouth daily.      . pantoprazole (PROTONIX) 40 MG tablet Take 1 tablet (40 mg total) by mouth daily.  30 tablet  0  . potassium chloride SA (K-DUR,KLOR-CON) 20 MEQ tablet Take 20 mEq by mouth daily.      Marland Kitchen thiamine 100 MG tablet Take 1 tablet (100 mg total) by mouth daily.  30 tablet  4  . torsemide (DEMADEX) 20 MG tablet Take 2 tablets (40 mg total) by mouth 2 (two) times daily.  60 tablet  6  . warfarin (COUMADIN) 5 MG tablet Take 1 1/2 tablets (7.5 mg) on Mondays and  Fridays  take 1 tablet (5 mg) on Sunday, Tuesday, Wednesday Thursday and Saturday      . nitroGLYCERIN (NITROSTAT) 0.4 MG SL tablet Place 1 tablet (0.4 mg total) under the tongue every 5 (five) minutes x 3 doses as needed for chest pain.  25 tablet  3   Assessment: 68yo male on chronic Coumadin PTA for h/o mitral and aortic valve replacement + AFib.  Recorded home dose (listed above) varies slightly from outpatient  anticoag flow-sheet which states 5mg  daily except 7.5mg  on Mon.  Will need to clarify with patient once able.    INR was therapeutic on admission, but trended above goal range & dose held x 2 days.  INR now sub-therapeutic.  Pt remains sedated on ventilator.   He is receiving tube feedings.   No bleeding noted.    Goal of Therapy:  INR 2.5 - 3.5    Plan:   Coumadin 7.5mg  per tube x1 today    INR daily  Clarify outpatient Coumadin regimen with patient when able  Lavonia Drafts Jeron Grahn 12/03/2013,8:46 AM

## 2013-12-03 NOTE — Progress Notes (Signed)
[facility] Progress Note Patient Name: HEZEKIAH VELTRE DOB: [birth year] MRN: [id number]  Date of Service  12/03/2013   HPI/Events of Note  hypokalemia   eICU Interventions  Potassium replaced   Intervention Category Intermediate Interventions: Electrolyte abnormality - evaluation and management  Guadelupe Sabin Faithlynn Deeley 12/03/2013, 6:57 AM

## 2013-12-03 NOTE — Progress Notes (Signed)
Subjective: He remains intubated and on the ventilator. He was hypokalemic this morning and his potassium is being replaced. His heart rate is better.  Objective: Vital signs in last 24 hours: Temp:  [97.2 F (36.2 C)-99.7 F (37.6 C)] 97.7 F (36.5 C) (05/31 0400) Pulse Rate:  [55-183] 60 (05/31 0630) Resp:  [12-31] 23 (05/31 0630) BP: (85-147)/(45-90) 123/56 mmHg (05/31 0630) SpO2:  [91 %-97 %] 95 % (05/31 0723) FiO2 (%):  [40 %-45 %] 40 % (05/31 0723) Weight:  [66.8 kg (147 lb 4.3 oz)] 66.8 kg (147 lb 4.3 oz) (05/31 0500) Weight change: -2.2 kg (-4 lb 13.6 oz) Last BM Date: 12/02/13  Intake/Output from previous day: 05/30 0701 - 05/31 0700 In: 2175.4 [I.V.:825.4; NG/GT:1200; IV Piggyback:150] Out: 2400 [Urine:2400]  PHYSICAL EXAM General appearance: Intubated sedated and on the ventilator Resp: He still has some rales but his chest is much clearer than before Cardio: He is in atrial fibrillation but now has a ventricular response in the 80s GI: soft, non-tender; bowel sounds normal; no masses,  no organomegaly Extremities: extremities normal, atraumatic, no cyanosis or edema  Lab Results:    Basic Metabolic Panel:  Recent Labs  12/02/13 0524 12/03/13 0454  NA 144 145  K 3.5* 2.9*  CL 94* 95*  CO2 40* 39*  GLUCOSE 122* 189*  BUN 51* 65*  CREATININE 1.46* 1.29  CALCIUM 8.9 8.9   Liver Function Tests:  Recent Labs  12/03/13 0454  AST 29  ALT 16  ALKPHOS 69  BILITOT 0.6  PROT 6.2  ALBUMIN 3.1*   No results found for this basename: LIPASE, AMYLASE,  in the last 72 hours No results found for this basename: AMMONIA,  in the last 72 hours CBC:  Recent Labs  12/02/13 0524 12/03/13 0454  WBC 7.6 7.2  NEUTROABS 7.1 6.9  HGB 9.9* 10.5*  HCT 32.9* 34.6*  MCV 95.1 95.1  PLT 267 235   Cardiac Enzymes:  Recent Labs  12/01/13 0738 12/01/13 1322 12/01/13 1858  TROPONINI 0.90* 1.04* 0.84*   BNP: No results found for this basename: PROBNP,  in the  last 72 hours D-Dimer: No results found for this basename: DDIMER,  in the last 72 hours CBG:  Recent Labs  12/02/13 0346 12/02/13 0732 12/02/13 1144 12/02/13 1620 12/02/13 2001 12/03/13 0404  GLUCAP 123* 138* 153* 175* 152* 183*   Hemoglobin A1C: No results found for this basename: HGBA1C,  in the last 72 hours Fasting Lipid Panel:  Recent Labs  12/01/13 0738  TRIG 104   Thyroid Function Tests: No results found for this basename: TSH, T4TOTAL, FREET4, T3FREE, THYROIDAB,  in the last 72 hours Anemia Panel: No results found for this basename: VITAMINB12, FOLATE, FERRITIN, TIBC, IRON, RETICCTPCT,  in the last 72 hours Coagulation:  Recent Labs  12/02/13 0524 12/03/13 0454  LABPROT 24.6* 19.4*  INR 2.31* 1.69*   Urine Drug Screen: Drugs of Abuse  No results found for this basename: labopia, cocainscrnur, labbenz, amphetmu, thcu, labbarb    Alcohol Level: No results found for this basename: ETH,  in the last 72 hours Urinalysis: No results found for this basename: COLORURINE, APPERANCEUR, LABSPEC, PHURINE, GLUCOSEU, HGBUR, BILIRUBINUR, KETONESUR, PROTEINUR, UROBILINOGEN, NITRITE, LEUKOCYTESUR,  in the last 72 hours Misc. Labs:  ABGS  Recent Labs  12/03/13 0425  PHART 7.446  PO2ART 92.7  TCO2 36.5  HCO3 39.8*   CULTURES Recent Results (from the past 240 hour(s))  MRSA PCR SCREENING     Status: None  Collection Time    11/26/13  9:40 PM      Result Value Ref Range Status   MRSA by PCR NEGATIVE  NEGATIVE Final   Comment:            The GeneXpert MRSA Assay (FDA     approved for NASAL specimens     only), is one component of a     comprehensive MRSA colonization     surveillance program. It is not     intended to diagnose MRSA     infection nor to guide or     monitor treatment for     MRSA infections.   Studies/Results: Dg Chest Port 1 View  12/03/2013   CLINICAL DATA:  Respiratory failure  EXAM: PORTABLE CHEST - 1 VIEW  COMPARISON:  12/02/2013,  11/26/2013  FINDINGS: Endotracheal tube with the tip 4.7 cm above the carina. Nasogastric tube coursing below the diaphragm excluded from the field of view.  Bilateral small pleural effusions. Bilateral diffuse interstitial thickening. Bibasilar hazy airspace disease likely reflecting atelectasis. No pneumothorax. Stable cardiomegaly. Prior CABG. Thoracic aortic atherosclerosis.  Unremarkable osseous structures.  IMPRESSION: 1. Endotracheal tube with the tip 4.7 cm above the carina. 2. Stable cardiomegaly. Bilateral small pleural effusions and bibasilar airspace disease concerning for CHF. Developing bibasilar infiltrates cannot be excluded.   Electronically Signed   By: Kathreen Devoid   On: 12/03/2013 07:29   Dg Chest Port 1 View  12/02/2013   CLINICAL DATA:  Followup chest radiograph  EXAM: PORTABLE CHEST - 1 VIEW  COMPARISON:  12/01/2013; 11/30/2013; 11/29/2013  FINDINGS: Grossly unchanged enlarged cardiac silhouette and mediastinal contours post median sternotomy and valve replacement. Stable positioning of support apparatus. The pulmonary vasculature remains indistinct with cephalization of flow. Grossly unchanged layering small to moderate sized bilateral effusions with associated bilateral mid and lower lung heterogeneous/consolidative opacities, left greater than right. No new focal airspace opacities. No pneumothorax. Unchanged bones.  IMPRESSION: 1. Stable positioning of support apparatus.  No pneumothorax. 2. Grossly unchanged findings of edema with layering small to moderate sized bilateral effusions and associated bibasilar opacities, atelectasis versus infiltrate.   Electronically Signed   By: Sandi Mariscal M.D.   On: 12/02/2013 08:26    Medications:  Prior to Admission:  Prescriptions prior to admission  Medication Sig Dispense Refill  . amLODipine (NORVASC) 10 MG tablet Take 10 mg by mouth at bedtime.      Marland Kitchen atorvastatin (LIPITOR) 40 MG tablet Take 40 mg by mouth daily.      . clopidogrel  (PLAVIX) 75 MG tablet Take 1 tablet (75 mg total) by mouth daily with breakfast.  30 tablet  6  . ferrous sulfate 325 (65 FE) MG EC tablet Take 325 mg by mouth daily with breakfast.      . folic acid (FOLVITE) 1 MG tablet Take 1 tablet (1 mg total) by mouth daily.  30 tablet  4  . levalbuterol (XOPENEX) 0.63 MG/3ML nebulizer solution Take 3 mLs (0.63 mg total) by nebulization every 6 (six) hours as needed for wheezing or shortness of breath.  3 mL  4  . magnesium oxide (MAG-OX) 400 (241.3 MG) MG tablet Take 1 tablet (400 mg total) by mouth daily.  30 tablet  6  . metoprolol (LOPRESSOR) 100 MG tablet Take 1 tablet (100 mg total) by mouth 2 (two) times daily.  60 tablet  6  . Multiple Vitamin (MULTIVITAMIN WITH MINERALS) TABS Take 1 tablet by mouth daily.      Marland Kitchen  pantoprazole (PROTONIX) 40 MG tablet Take 1 tablet (40 mg total) by mouth daily.  30 tablet  0  . potassium chloride SA (K-DUR,KLOR-CON) 20 MEQ tablet Take 20 mEq by mouth daily.      Marland Kitchen thiamine 100 MG tablet Take 1 tablet (100 mg total) by mouth daily.  30 tablet  4  . torsemide (DEMADEX) 20 MG tablet Take 2 tablets (40 mg total) by mouth 2 (two) times daily.  60 tablet  6  . warfarin (COUMADIN) 5 MG tablet Take 1 1/2 tablets (7.5 mg) on Mondays and  Fridays  take 1 tablet (5 mg) on Sunday, Tuesday, Wednesday Thursday and Saturday      . nitroGLYCERIN (NITROSTAT) 0.4 MG SL tablet Place 1 tablet (0.4 mg total) under the tongue every 5 (five) minutes x 3 doses as needed for chest pain.  25 tablet  3   Scheduled: . antiseptic oral rinse  15 mL Mouth Rinse QID  . chlorhexidine  15 mL Mouth Rinse BID  . feeding supplement (PRO-STAT SUGAR FREE 64)  30 mL Per Tube BID  . feeding supplement (VITAL HIGH PROTEIN)  1,000 mL Per Tube Q24H  . furosemide  60 mg Intravenous Q12H  . insulin aspart  0-9 Units Subcutaneous TID WC  . ipratropium-albuterol  3 mL Nebulization Q4H  . methylPREDNISolone (SOLU-MEDROL) injection  40 mg Intravenous Q12H  .  metoprolol tartrate  50 mg Per Tube BID  . nicotine  14 mg Transdermal Daily  . pantoprazole (PROTONIX) IV  40 mg Intravenous Daily  . piperacillin-tazobactam (ZOSYN)  IV  3.375 g Intravenous Q8H  . potassium chloride  40 mEq Per Tube Q4H  . sodium chloride  3 mL Intravenous Q12H  . thiamine  100 mg Oral Daily   Or  . thiamine  100 mg Intravenous Daily  . vancomycin  750 mg Intravenous Q18H  . Warfarin - Pharmacist Dosing Inpatient   Does not apply Q24H   Continuous: . diltiazem (CARDIZEM) infusion 10 mg/hr (12/03/13 0326)  . propofol 50 mcg/kg/min (12/03/13 0326)   HYQ:MVHQIO chloride, acetaminophen, albuterol, fentaNYL, midazolam, sodium chloride  Assesment: He was admitted with some sort of jerking that seems to have been related to hypoxia. After he was admitted he developed increasing problems with his breathing that culminated in him having to be intubated and placed on mechanical ventilation. This appears to be mostly from acute on chronic diastolic heart failure but he also has a significant component of COPD. He has been in atrial fibrillation but his heart rate is better controlled. He has history of aortic and mitral valve replacement. He has known coronary artery occlusive disease and his troponin level went up somewhat when he had the atrial fib with rapid ventricular response he is hypokalemic now Principal Problem:   Seizure-like activity Active Problems:   ANEMIA   TOBACCO ABUSE   CHRONIC OBSTRUCTIVE PULMONARY DISEASE   MITRAL VALVE REPLACEMENT, HX OF   AORTIC VALVE REPLACEMENT, HX OF   Chronic anticoagulation   Coronary atherosclerosis of unspecified type of vessel, native or graft   Acute on chronic diastolic CHF (congestive heart failure), NYHA class 4   Hyponatremia   Atrial fibrillation status post cardioversion   Alcohol abuse    Plan: His potassium will be replaced. I'm going to see if we can make his diltiazem given down the tube. Weaning will be attempted  since his heart rate is better    LOS: 7 days   Alonza Bogus 12/03/2013,  7:43 AM

## 2013-12-03 NOTE — Progress Notes (Signed)
NIF -28 FVC 1.3 L

## 2013-12-03 NOTE — Procedures (Signed)
Extubation Procedure Note  Patient Details:   Name: [name] DOB: [birth year] MRN: [id number]   Airway Documentation:     Evaluation  O2 sats: stable throughout Complications: No apparent complications Patient did tolerate procedure well. Bilateral Breath Sounds: Diminished Suctioning: Airway Yes   Pt extubated per MD order.  Pt placed on 5L nasal cannula O2 sat 89-92%.  Rt will continue to monitor.   Edgar Frisk 12/03/2013, 9:11 AM

## 2013-12-03 NOTE — Progress Notes (Signed)
Nutrition Follow-up   INTERVENTION: Add Ensure Complete po BID, each supplement provides 350 kcal and 13 grams of protein   Continue ProStat 30 ml BID  Discontinue Vital 1.2  NUTRITION DIAGNOSIS: Inadequate oral intake; progressing with diet advancement  Goal: Pt to meet >/= 90% of their estimated nutrition needs; not met   Monitor:  Respiratory status, tolerance of diet advancement and adequacy of intake, labs, I/O's and weight trends   68 y.o. male  Admitting Dx: Seizure-like activity  ASSESSMENT: Pt successfully extubated this morning and diet advanced. Weight decrease of 11 kg, 15% since admission.   Height: Ht Readings from Last 1 Encounters:  12/01/13 $RemoveB'5\' 7"'fuInocNf$  (1.702 m)    Weight: Wt Readings from Last 1 Encounters:  12/03/13 147 lb 4.3 oz (66.8 kg)  Admit weight-172.9# (78.3 kg)  Ideal Body Weight: 148# (67 kg)  Wt Readings from Last 10 Encounters:  12/03/13 147 lb 4.3 oz (66.8 kg)  10/30/13 162 lb (73.483 kg)  10/23/13 160 lb (72.576 kg)  10/11/13 160 lb (72.576 kg)  09/04/13 157 lb (71.215 kg)  08/16/13 148 lb 7.7 oz (67.35 kg)  08/03/13 152 lb (68.947 kg)  07/30/13 142 lb 12.8 oz (64.774 kg)  07/30/13 142 lb 12.8 oz (64.774 kg)  06/01/13 160 lb 8 oz (72.802 kg)    Usual Body Weight: 160#  BMI:  Body mass index is 23.06 kg/(m^2).normal range  Estimated Nutritional Needs: Kcal: 1700-2000 Protein: 100-115 gr Fluid: per team goal  Skin: intact  Diet Order: Dysphagia 3 with thin liquids  EDUCATION NEEDS: -Education not appropriate at this time   Intake/Output Summary (Last 24 hours) at 12/03/13 1446 Last data filed at 12/03/13 0600  Gross per 24 hour  Intake 1744.57 ml  Output   2400 ml  Net -655.43 ml    Last BM: 12/02/13 large, loose, brown stool   Labs:   Recent Labs Lab 11/26/13 1826  12/01/13 0121 12/02/13 0524 12/03/13 0454  NA 125*  < > 141 144 145  K 4.5  < > 3.5* 3.5* 2.9*  CL 85*  < > 89* 94* 95*  CO2 23  < > 40* 40*  39*  BUN 30*  < > 34* 51* 65*  CREATININE 1.38*  < > 1.42* 1.46* 1.29  CALCIUM 8.3*  < > 9.0 8.9 8.9  MG 1.7  --   --   --   --   GLUCOSE 120*  < > 153* 122* 189*  < > = values in this interval not displayed.  CBG (last 3)   Recent Labs  12/02/13 2001 12/03/13 0404 12/03/13 0741  GLUCAP 152* 183* 156*    Scheduled Meds: . antiseptic oral rinse  15 mL Mouth Rinse QID  . chlorhexidine  15 mL Mouth Rinse BID  . diltiazem  60 mg Oral 4 times per day  . feeding supplement (ENSURE COMPLETE)  237 mL Oral BID BM  . feeding supplement (PRO-STAT SUGAR FREE 64)  30 mL Per Tube BID  . feeding supplement (VITAL HIGH PROTEIN)  1,000 mL Per Tube Q24H  . furosemide  60 mg Intravenous Q12H  . insulin aspart  0-9 Units Subcutaneous TID WC  . ipratropium-albuterol  3 mL Nebulization Q4H  . methylPREDNISolone (SOLU-MEDROL) injection  40 mg Intravenous Q12H  . metoprolol tartrate  50 mg Per Tube BID  . nicotine  14 mg Transdermal Daily  . pantoprazole (PROTONIX) IV  40 mg Intravenous Daily  . piperacillin-tazobactam (ZOSYN)  IV  3.375  g Intravenous Q8H  . potassium chloride  40 mEq Per Tube Q4H  . sodium chloride  3 mL Intravenous Q12H  . thiamine  100 mg Oral Daily   Or  . thiamine  100 mg Intravenous Daily  . vancomycin  750 mg Intravenous Q18H  . warfarin  7.5 mg Per Tube Once  . Warfarin - Pharmacist Dosing Inpatient   Does not apply Q24H    Continuous Infusions:    Past Medical History  Diagnosis Date  . Essential hypertension, benign   . Coronary atherosclerosis of native coronary artery     a. CABG/MVR/AVR-2003 (#25 St. Jude/#21 St. Jude); anticoagulation; b. negative stress nuclear-2005;  c. 01/2013 NSTEMI/Cath/PCI: LM nl, LAD 50p, 40-33m, D1 50ost, LCX 43m, RCA 50/6m (2.5x20 Promus DES), 50d, VG->Diag 100, VG->PDA 100, VG->OM 100, LIMA->LAD ok.  . Epistaxis     Requiring cautery & aterial ligation-09/2009  . GERD (gastroesophageal reflux disease)   . Hyperlipemia   .  Abnormal LFTs     Possible cirrhosis  . Chronic anticoagulation   . Valvular heart disease     a. 2003: MVR/AVR-2003 (#25 St. Jude/#21 St. Jude);  b. 01/2013 Echo: EF 55%, Mech AVR mean grad 12, Mech MVR mean grad 6.  . Tobacco abuse     45 pack years  . Fasting hyperglycemia   . Nephrolithiasis   . Diverticulosis   . Hyponatremia   . Chronic diastolic CHF (congestive heart failure)   . Chronic renal disease   . Hemolytic anemia   . ETOH abuse   . Atrial fibrillation 01/16/2013    On coumadin DCCV 07/2013.   Marland Kitchen COPD (chronic obstructive pulmonary disease)     Past Surgical History  Procedure Laterality Date  . Endocopic sphenopalatine artery ligation & cautry    . Inguinal hernia repair      Left & right  . Coronary artery bypass graft  11/2001    [facility]  . Cardiac valve replacement  11/2001    AVR and MVR-St. Jude devices  . Cataract extraction w/phaco  01/20/2011    Procedure: CATARACT EXTRACTION PHACO AND INTRAOCULAR LENS PLACEMENT (IOC);  Surgeon: Elta Guadeloupe T. Gershon Crane;  Location: [facility];  Service: Ophthalmology;  Laterality: Right;  CDE: 8.51  . Cataract extraction w/phaco  02/03/2011    Procedure: CATARACT EXTRACTION PHACO AND INTRAOCULAR LENS PLACEMENT (IOC);  Surgeon: Elta Guadeloupe T. Gershon Crane;  Location: [facility];  Service: Ophthalmology;  Laterality: Left;  CDE:10.01  . Colonoscopy  04/05/02; 08/2011    friable anal canal hemorrhoids otherwise normal; 2 diminutive polyps excised, minimal diverticulosis noted  . Esophagogastroduodenoscopy  01/2002    Dr. Laural Golden, submucosal esophageal lesion c/w leiomyoma  . Colonoscopy  09/02/2011    Procedure: COLONOSCOPY;  Surgeon: Daneil Dolin, MD;  Location: [facility];  Service: Endoscopy;  Laterality: N/A;  8:15  . Yag laser application Right 51/08/5850    Procedure: YAG LASER APPLICATION;  Surgeon: Elta Guadeloupe T. Gershon Crane, MD;  Location: [facility];  Service: Ophthalmology;  Laterality: Right;    Colman Cater MS,RD,CSG,LDN Office:  [phone] Pager: [phone]

## 2013-12-04 ENCOUNTER — Inpatient Hospital Stay (HOSPITAL_COMMUNITY): Payer: Medicare Other

## 2013-12-04 DIAGNOSIS — E785 Hyperlipidemia, unspecified: Secondary | ICD-10-CM

## 2013-12-04 DIAGNOSIS — I10 Essential (primary) hypertension: Secondary | ICD-10-CM

## 2013-12-04 DIAGNOSIS — J449 Chronic obstructive pulmonary disease, unspecified: Secondary | ICD-10-CM

## 2013-12-04 DIAGNOSIS — D649 Anemia, unspecified: Secondary | ICD-10-CM

## 2013-12-04 DIAGNOSIS — I214 Non-ST elevation (NSTEMI) myocardial infarction: Secondary | ICD-10-CM

## 2013-12-04 LAB — CBC WITH DIFFERENTIAL/PLATELET
BASOS ABS: 0 10*3/uL (ref 0.0–0.1)
Basophils Relative: 0 % (ref 0–1)
EOS PCT: 0 % (ref 0–5)
Eosinophils Absolute: 0 10*3/uL (ref 0.0–0.7)
HCT: 34.5 % — ABNORMAL LOW (ref 39.0–52.0)
HEMOGLOBIN: 10.3 g/dL — AB (ref 13.0–17.0)
Lymphocytes Relative: 3 % — ABNORMAL LOW (ref 12–46)
Lymphs Abs: 0.4 10*3/uL — ABNORMAL LOW (ref 0.7–4.0)
MCH: 28.3 pg (ref 26.0–34.0)
MCHC: 29.9 g/dL — ABNORMAL LOW (ref 30.0–36.0)
MCV: 94.8 fL (ref 78.0–100.0)
MONOS PCT: 1 % — AB (ref 3–12)
Monocytes Absolute: 0.1 10*3/uL (ref 0.1–1.0)
Neutro Abs: 14.5 10*3/uL — ABNORMAL HIGH (ref 1.7–7.7)
Neutrophils Relative %: 96 % — ABNORMAL HIGH (ref 43–77)
Platelets: 278 10*3/uL (ref 150–400)
RBC: 3.64 MIL/uL — ABNORMAL LOW (ref 4.22–5.81)
RDW: 16 % — AB (ref 11.5–15.5)
WBC: 15 10*3/uL — ABNORMAL HIGH (ref 4.0–10.5)

## 2013-12-04 LAB — BASIC METABOLIC PANEL
BUN: 61 mg/dL — AB (ref 6–23)
CHLORIDE: 95 meq/L — AB (ref 96–112)
CO2: 38 meq/L — AB (ref 19–32)
CREATININE: 1.14 mg/dL (ref 0.50–1.35)
Calcium: 9.1 mg/dL (ref 8.4–10.5)
GFR calc Af Amer: 74 mL/min — ABNORMAL LOW (ref >90)
GFR calc non Af Amer: 64 mL/min — ABNORMAL LOW (ref >90)
Glucose, Bld: 144 mg/dL — ABNORMAL HIGH (ref 70–99)
POTASSIUM: 4 meq/L (ref 3.7–5.3)
Sodium: 142 mEq/L (ref 137–147)

## 2013-12-04 LAB — TRIGLYCERIDES: Triglycerides: 77 mg/dL (ref <150)

## 2013-12-04 LAB — GLUCOSE, CAPILLARY
GLUCOSE-CAPILLARY: 186 mg/dL — AB (ref 70–99)
GLUCOSE-CAPILLARY: 98 mg/dL (ref 70–99)
Glucose-Capillary: 142 mg/dL — ABNORMAL HIGH (ref 70–99)
Glucose-Capillary: 159 mg/dL — ABNORMAL HIGH (ref 70–99)

## 2013-12-04 LAB — BLOOD GAS, ARTERIAL
Acid-Base Excess: 12.8 mmol/L — ABNORMAL HIGH (ref 0.0–2.0)
BICARBONATE: 37.5 meq/L — AB (ref 20.0–24.0)
Drawn by: 22223
O2 Content: 5 L/min
O2 SAT: 81.7 %
PATIENT TEMPERATURE: 37
PO2 ART: 49.7 mmHg — AB (ref 80.0–100.0)
TCO2: 34.3 mmol/L (ref 0–100)
pCO2 arterial: 54.4 mmHg — ABNORMAL HIGH (ref 35.0–45.0)
pH, Arterial: 7.453 — ABNORMAL HIGH (ref 7.350–7.450)

## 2013-12-04 LAB — PROTIME-INR
INR: 1.9 — ABNORMAL HIGH (ref 0.00–1.49)
PROTHROMBIN TIME: 21.2 s — AB (ref 11.6–15.2)

## 2013-12-04 LAB — CLOSTRIDIUM DIFFICILE BY PCR: Toxigenic C. Difficile by PCR: NEGATIVE

## 2013-12-04 MED ORDER — WARFARIN SODIUM 7.5 MG PO TABS
7.5000 mg | ORAL_TABLET | Freq: Once | ORAL | Status: AC
  Administered 2013-12-04: 7.5 mg
  Filled 2013-12-04: qty 1

## 2013-12-04 MED ORDER — PANTOPRAZOLE SODIUM 40 MG PO TBEC
40.0000 mg | DELAYED_RELEASE_TABLET | Freq: Every day | ORAL | Status: DC
  Administered 2013-12-05 – 2013-12-06 (×2): 40 mg via ORAL
  Filled 2013-12-04 (×2): qty 1

## 2013-12-04 MED ORDER — DILTIAZEM HCL 60 MG PO TABS
90.0000 mg | ORAL_TABLET | Freq: Four times a day (QID) | ORAL | Status: DC
  Administered 2013-12-04 – 2013-12-06 (×9): 90 mg via ORAL
  Filled 2013-12-04 (×18): qty 1

## 2013-12-04 MED ORDER — LOPERAMIDE HCL 2 MG PO CAPS
4.0000 mg | ORAL_CAPSULE | Freq: Two times a day (BID) | ORAL | Status: DC | PRN
  Administered 2013-12-04: 4 mg via ORAL
  Filled 2013-12-04: qty 2

## 2013-12-04 MED ORDER — TORSEMIDE 20 MG PO TABS
40.0000 mg | ORAL_TABLET | Freq: Two times a day (BID) | ORAL | Status: DC
  Administered 2013-12-04 – 2013-12-06 (×4): 40 mg via ORAL
  Filled 2013-12-04 (×4): qty 2

## 2013-12-04 MED ORDER — ATORVASTATIN CALCIUM 20 MG PO TABS
20.0000 mg | ORAL_TABLET | Freq: Every day | ORAL | Status: DC
  Administered 2013-12-04 – 2013-12-05 (×2): 20 mg via ORAL
  Filled 2013-12-04 (×2): qty 1

## 2013-12-04 NOTE — Progress Notes (Signed)
The patient was seen and examined, and I agree with the assessment and plan as documented above, with modifications as noted below. Given elevated rates with atrial fibrillation, will increase oral diltiazem. Agree with switching to oral diuretics today given substantial response over last several days.

## 2013-12-04 NOTE — Progress Notes (Signed)
ANTICOAGULATION CONSULT NOTE  Pharmacy Consult for Coumadin  Indication: Afib + St Jude's AVR/MVR   No Known Allergies  Patient Measurements: Height: 5\' 7"  (170.2 cm) Weight: 151 lb 7.3 oz (68.7 kg) IBW/kg (Calculated) : 66.1  Vital Signs: Temp: 97 F (36.1 C) (06/01 0400) Temp src: Axillary (06/01 0400) BP: 157/59 mmHg (06/01 0615) Pulse Rate: 51 (06/01 0615)  Labs:  Recent Labs  12/01/13 1322 12/01/13 1858  12/02/13 0524 12/03/13 0454 12/04/13 0440  HGB  --   --   < > 9.9* 10.5* 10.3*  HCT  --   --   --  32.9* 34.6* 34.5*  PLT  --   --   --  267 235 278  LABPROT  --   --   --  24.6* 19.4* 21.2*  INR  --   --   --  2.31* 1.69* 1.90*  CREATININE  --   --   --  1.46* 1.29 1.14  TROPONINI 1.04* 0.84*  --   --   --   --   < > = values in this interval not displayed. Estimated Creatinine Clearance: 58 ml/min (by C-G formula based on Cr of 1.14).  Medical History: Past Medical History  Diagnosis Date  . Essential hypertension, benign   . Coronary atherosclerosis of native coronary artery     a. CABG/MVR/AVR-2003 (#25 St. Jude/#21 St. Jude); anticoagulation; b. negative stress nuclear-2005;  c. 01/2013 NSTEMI/Cath/PCI: LM nl, LAD 50p, 40-49m, D1 50ost, LCX 62m, RCA 50/27m (2.5x20 Promus DES), 50d, VG->Diag 100, VG->PDA 100, VG->OM 100, LIMA->LAD ok.  . Epistaxis     Requiring cautery & aterial ligation-09/2009  . GERD (gastroesophageal reflux disease)   . Hyperlipemia   . Abnormal LFTs     Possible cirrhosis  . Chronic anticoagulation   . Valvular heart disease     a. 2003: MVR/AVR-2003 (#25 St. Jude/#21 St. Jude);  b. 01/2013 Echo: EF 55%, Mech AVR mean grad 12, Mech MVR mean grad 6.  . Tobacco abuse     45 pack years  . Fasting hyperglycemia   . Nephrolithiasis   . Diverticulosis   . Hyponatremia   . Chronic diastolic CHF (congestive heart failure)   . Chronic renal disease   . Hemolytic anemia   . ETOH abuse   . Atrial fibrillation 01/16/2013    On coumadin  DCCV 07/2013.   Marland Kitchen COPD (chronic obstructive pulmonary disease)    Medications:  Prescriptions prior to admission  Medication Sig Dispense Refill  . amLODipine (NORVASC) 10 MG tablet Take 10 mg by mouth at bedtime.      Marland Kitchen atorvastatin (LIPITOR) 40 MG tablet Take 40 mg by mouth daily.      . clopidogrel (PLAVIX) 75 MG tablet Take 1 tablet (75 mg total) by mouth daily with breakfast.  30 tablet  6  . ferrous sulfate 325 (65 FE) MG EC tablet Take 325 mg by mouth daily with breakfast.      . folic acid (FOLVITE) 1 MG tablet Take 1 tablet (1 mg total) by mouth daily.  30 tablet  4  . levalbuterol (XOPENEX) 0.63 MG/3ML nebulizer solution Take 3 mLs (0.63 mg total) by nebulization every 6 (six) hours as needed for wheezing or shortness of breath.  3 mL  4  . magnesium oxide (MAG-OX) 400 (241.3 MG) MG tablet Take 1 tablet (400 mg total) by mouth daily.  30 tablet  6  . metoprolol (LOPRESSOR) 100 MG tablet Take 1 tablet (100 mg  total) by mouth 2 (two) times daily.  60 tablet  6  . Multiple Vitamin (MULTIVITAMIN WITH MINERALS) TABS Take 1 tablet by mouth daily.      . pantoprazole (PROTONIX) 40 MG tablet Take 1 tablet (40 mg total) by mouth daily.  30 tablet  0  . potassium chloride SA (K-DUR,KLOR-CON) 20 MEQ tablet Take 20 mEq by mouth daily.      Marland Kitchen thiamine 100 MG tablet Take 1 tablet (100 mg total) by mouth daily.  30 tablet  4  . torsemide (DEMADEX) 20 MG tablet Take 2 tablets (40 mg total) by mouth 2 (two) times daily.  60 tablet  6  . warfarin (COUMADIN) 5 MG tablet Take 1 1/2 tablets (7.5 mg) on Mondays and  Fridays  take 1 tablet (5 mg) on Sunday, Tuesday, Wednesday Thursday and Saturday      . nitroGLYCERIN (NITROSTAT) 0.4 MG SL tablet Place 1 tablet (0.4 mg total) under the tongue every 5 (five) minutes x 3 doses as needed for chest pain.  25 tablet  3   Assessment: 68yo male on chronic Coumadin PTA for h/o mitral and aortic valve replacement + AFib.  Recorded home dose (listed above) varies  slightly from outpatient anticoag flow-sheet which states 5mg  daily except 7.5mg  on Mon.  Will need to clarify with patient once able.    INR was therapeutic on admission, but trended above goal range & dose held x 2 days.  INR now sub-therapeutic.  Pt was extubated yesterday.  Tube feedings were changed to diet + Ensure supplements.  HF exacerbation has improved. No bleeding noted.    Goal of Therapy:  INR 2.5 - 3.5    Plan:   Coumadin 7.5mg  po x1 today  (per home regimen)  INR daily  Clarify outpatient Coumadin regimen with patient when able  Lavonia Drafts Vaanya Shambaugh 12/04/2013,7:57 AM

## 2013-12-04 NOTE — Progress Notes (Signed)
The patient is receiving Protonix by the intravenous route.  Based on criteria approved by the Pharmacy and [place], the medication is being converted to the equivalent oral dose form.  These criteria include: -No Active GI bleeding -Able to tolerate diet of full liquids (or better) or tube feeding OR able to tolerate other medications by the oral or enteral route  If you have any questions about this conversion, please contact the Pharmacy Department (ext [phone]).  Thank you.  [place], [facility] 12/04/2013 2:30 PM

## 2013-12-04 NOTE — Plan of Care (Signed)
Problem: Phase I Progression Outcomes Goal: Up in chair, BRP Outcome: Completed/Met Date Met:  12/04/13 OOB to chair and using BSC with Stedy for mobility

## 2013-12-04 NOTE — Progress Notes (Signed)
Consulting cardiologist: Kate Sable MD Primary Cardiologist: Carlyle Dolly MD  Subjective:     Feels better, breathing better, no complaints of pain.  Objective:   Temp:  [97 F (36.1 C)-98 F (36.7 C)] 97.6 F (36.4 C) (06/01 0809) Pulse Rate:  [30-123] 51 (06/01 0615) Resp:  [13-23] 17 (06/01 0615) BP: (121-163)/(48-116) 157/59 mmHg (06/01 0615) SpO2:  [84 %-96 %] 91 % (06/01 0645) Weight:  [151 lb 7.3 oz (68.7 kg)] 151 lb 7.3 oz (68.7 kg) (06/01 0500) Last BM Date: 12/04/13  Filed Weights   12/02/13 0500 12/03/13 0500 12/04/13 0500  Weight: 152 lb 1.9 oz (69 kg) 147 lb 4.3 oz (66.8 kg) 151 lb 7.3 oz (68.7 kg)    Intake/Output Summary (Last 24 hours) at 12/04/13 7893 Last data filed at 12/04/13 0600  Gross per 24 hour  Intake    820 ml  Output   2600 ml  Net  -1780 ml    Telemetry: Atrial fib with PVC;s.  Exam:  General: No acute distress.  HEENT: Conjunctiva and lids normal, oropharynx clear.  Lungs: Clear to auscultation, nonlabored.  Cardiac: No elevated JVP or bruits. IRRR, no gallop or rub. With crisp click of prosthetic valve.  Abdomen: Normoactive bowel sounds, nontender, nondistended.  Extremities: No pitting edema, distal pulses full.  Neuropsychiatric: Alert and oriented x3, affect appropriate.   Lab Results:  Basic Metabolic Panel:  Recent Labs Lab 12/02/13 0524 12/03/13 0454 12/04/13 0440  NA 144 145 142  K 3.5* 2.9* 4.0  CL 94* 95* 95*  CO2 40* 39* 38*  GLUCOSE 122* 189* 144*  BUN 51* 65* 61*  CREATININE 1.46* 1.29 1.14  CALCIUM 8.9 8.9 9.1    Liver Function Tests:  Recent Labs Lab 11/30/13 0406 12/03/13 0454  AST 30 29  ALT 10 16  ALKPHOS 92 69  BILITOT 0.8 0.6  PROT 6.3 6.2  ALBUMIN 2.9* 3.1*    CBC:  Recent Labs Lab 12/02/13 0524 12/03/13 0454 12/04/13 0440  WBC 7.6 7.2 15.0*  HGB 9.9* 10.5* 10.3*  HCT 32.9* 34.6* 34.5*  MCV 95.1 95.1 94.8  PLT 267 235 278    Cardiac Enzymes:  Recent  Labs Lab 12/01/13 0738 12/01/13 1322 12/01/13 1858  TROPONINI 0.90* 1.04* 0.84*    BNP:  Recent Labs  07/23/13 1008 08/14/13 1637 11/26/13 1826  PROBNP 8081.0* 5977.0* 12192.0*    Coagulation:  Recent Labs Lab 12/02/13 0524 12/03/13 0454 12/04/13 0440  INR 2.31* 1.69* 1.90*    Radiology: Dg Chest Port 1 View  12/03/2013   CLINICAL DATA:  Respiratory failure  EXAM: PORTABLE CHEST - 1 VIEW  COMPARISON:  12/02/2013, 11/26/2013  FINDINGS: Endotracheal tube with the tip 4.7 cm above the carina. Nasogastric tube coursing below the diaphragm excluded from the field of view.  Bilateral small pleural effusions. Bilateral diffuse interstitial thickening. Bibasilar hazy airspace disease likely reflecting atelectasis. No pneumothorax. Stable cardiomegaly. Prior CABG. Thoracic aortic atherosclerosis.  Unremarkable osseous structures.  IMPRESSION: 1. Endotracheal tube with the tip 4.7 cm above the carina. 2. Stable cardiomegaly. Bilateral small pleural effusions and bibasilar airspace disease concerning for CHF. Developing bibasilar infiltrates cannot be excluded.   Electronically Signed   By: Kathreen Devoid   On: 12/03/2013 07:29      Medications:   Scheduled Medications: . diltiazem  60 mg Oral 4 times per day  . feeding supplement (ENSURE COMPLETE)  237 mL Oral BID BM  . feeding supplement (PRO-STAT SUGAR FREE 64)  30 mL Oral BID  . furosemide  60 mg Intravenous Q12H  . insulin aspart  0-9 Units Subcutaneous TID WC  . ipratropium-albuterol  3 mL Nebulization TID  . methylPREDNISolone (SOLU-MEDROL) injection  40 mg Intravenous Q12H  . metoprolol tartrate  50 mg Per Tube BID  . nicotine  14 mg Transdermal Daily  . pantoprazole (PROTONIX) IV  40 mg Intravenous Daily  . piperacillin-tazobactam (ZOSYN)  IV  3.375 g Intravenous Q8H  . sodium chloride  3 mL Intravenous Q12H  . thiamine  100 mg Oral Daily   Or  . thiamine  100 mg Intravenous Daily  . vancomycin  750 mg Intravenous Q18H   . warfarin  7.5 mg Per Tube Once  . Warfarin - Pharmacist Dosing Inpatient   Does not apply Q24H     PRN Medications:  sodium chloride, acetaminophen, albuterol, fentaNYL, LORazepam, sodium chloride   Assessment and Plan:   1. Atrial fib with RVR: Rate is moderately controlled. Review of telemetry has his rates between 95-110 at rest. He is currently on po diltiazem 60 mg Q 6 hours (240 mg daily) and metoprolol 50 mg BID. with BP 150'2 to 809;X range systolic. Can tolerate a higher doses of CCB or BB  to assist with better HR control. Echo in January of 2015 demonstrated normal EF of 50-55%, with extensive MAC with normal appearing mechanical AVR and M  Will increase diltiazem over BB,  as he has hx of COPD, to 90 mg Q 6 hours for total dose of 360 mg daily.  He continues on coumadin therapy per pharmacy. INR is 1.90 this am. Hgb stable.   2. VDRF/CHF Now extubated. Thought to be related to CHF exacerbation associated with Afib RVR, and COPD exacerbation. He is breathing much better on  O2. Continues on abx per pulmonary.   3. Diastolic CHF: Contributing to VDRF with and has diuresed very well. He is breathing much better. Will transition to PO diuretics torsemide 40 mg BID and d/c foley catheter to avoid UTI. Check BMET in am. Creatinine this am 1.14. Weight down 21 lbs since admission.  4. Valvular Heart Disease: Status post St. Jude mechanical AVR and MVR. Last echocardiogram was in January of this year. On Coumadin chronically, INR and dosing per pharmacy.   5, Multivessel CAD: S/P CABG. Continue BB and begin statin,  Uncertain why he is not on this yet, add lipiitor.       Phill Myron. Cassi Jenne NP  12/04/2013, 8:32 AM

## 2013-12-04 NOTE — Progress Notes (Signed)
Patient was brought his upper and lower dentures by a family member. Family and patient very concerned and worried that dentures not be misplaced. Patient has denture cup for storage.

## 2013-12-04 NOTE — Evaluation (Signed)
Physical Therapy Evaluation Patient Details Name: [name] MRN: [id number] DOB: [birth year] Today's Date: 12/04/2013   History of Present Illness  68 yo M admitted with HF exacerbation on 5/24.  He developed hypoxia & dyspnea overnight requiring intubation.  CXR today can not exclude PNA.  WBC is normal & afebrile.  Empiric, broad-spectrum antibiotics were initiated for possible hospital- acquired infection.   Clinical Impression  Pt presents with dependencies in mobility affecting his independence. Pt would benefit from skille PT to maximize mobility and Independence for short term SNF placement for rehab. Pt was able to ambulate 25 feet with mod assist. Pt did  Have decreased O2 saturation to 84% on 5 liters with gait. Pt does have poor balance and is a high fall risk. Recommend continued acute PT for improved safety and independence with mobility.    Follow Up Recommendations SNF    Equipment Recommendations  None recommended by PT    Recommendations for Other Services       Precautions / Restrictions Precautions Precautions: Fall Restrictions Weight Bearing Restrictions: No      Mobility  Bed Mobility                  Transfers Overall transfer level: Needs assistance Equipment used: Rolling walker (2 wheeled) Transfers: Sit to/from Stand Sit to Stand: Mod assist         General transfer comment: cues for hand placement, cues to increase forward trunk flexion to initiate initial rise up to stand, posterior lean and hand tremors noted. Cues for upright posture, pt like to maintain cervial  flexion with rotation to the left.  Ambulation/Gait Ambulation/Gait assistance: Mod assist Ambulation Distance (Feet): 25 Feet Assistive device: Rolling walker (2 wheeled) Gait Pattern/deviations: Step-to pattern;Decreased stride length;Trunk flexed   Gait velocity interpretation: Below normal speed for age/gender General Gait Details: cues for sequencing, posterior  lean, min assistance to guide RW, pt able to increase bil step length when cued  Stairs            Wheelchair Mobility    Modified Rankin (Stroke Patients Only)       Balance Overall balance assessment: Needs assistance         Standing balance support: Bilateral upper extremity supported Standing balance-Leahy Scale: Poor                               Pertinent Vitals/Pain O2 96 % 5 liters pregait 84% post gait 5 liters.    Home Living Family/patient expects to be discharged to:: Private residence Living Arrangements: Alone   Type of Home: House Home Access: Stairs to enter Entrance Stairs-Rails: Right Entrance Stairs-Number of Steps: 3 Home Layout: One level Home Equipment:  (oxygen)      Prior Function Level of Independence: Independent               Hand Dominance        Extremity/Trunk Assessment   Upper Extremity Assessment: Defer to OT evaluation           Lower Extremity Assessment: Overall WFL for tasks assessed         Communication   Communication: No difficulties  Cognition Arousal/Alertness: Awake/alert Behavior During Therapy: WFL for tasks assessed/performed Overall Cognitive Status: Within Functional Limits for tasks assessed                      General Comments  Exercises        Assessment/Plan    PT Assessment Patient needs continued PT services  PT Diagnosis Difficulty walking   PT Problem List Decreased activity tolerance;Decreased balance;Decreased mobility;Decreased coordination;Decreased knowledge of use of DME;Cardiopulmonary status limiting activity;Decreased strength;Decreased range of motion  PT Treatment Interventions DME instruction;Gait training;Functional mobility training;Therapeutic activities;Therapeutic exercise;Balance training   PT Goals (Current goals can be found in the Care Plan section) Acute Rehab PT Goals Patient Stated Goal: to go home when able to care for  himself. PT Goal Formulation: With patient Time For Goal Achievement: 12/18/13 Potential to Achieve Goals: Good    Frequency Min 3X/week   Barriers to discharge        Co-evaluation               End of Session Equipment Utilized During Treatment: Gait belt;Oxygen Activity Tolerance: Patient limited by fatigue;Other (comment) (desaturation with activity) Patient left: in chair;with call bell/phone within reach Nurse Communication: Mobility status         Time: 1340-1406 PT Time Calculation (min): 26 min   Charges:   PT Evaluation $Initial PT Evaluation Tier I: 1 Procedure PT Treatments $Gait Training: 8-22 mins   PT G Codes:          Lelon Mast 12/04/2013, 2:06 PM

## 2013-12-04 NOTE — Progress Notes (Signed)
ANTIBIOTIC CONSULT NOTE  Pharmacy Consult for Vancomycin & Zosyn Indication: pneumonia  No Known Allergies  Patient Measurements: Height: 5\' 7"  (170.2 cm) Weight: 151 lb 7.3 oz (68.7 kg) IBW/kg (Calculated) : 66.1  Vital Signs: Temp: 97 F (36.1 C) (06/01 0400) Temp src: Axillary (06/01 0400) BP: 157/59 mmHg (06/01 0615) Pulse Rate: 51 (06/01 0615) Intake/Output from previous day: 05/31 0701 - 06/01 0700 In: 820 [IV Piggyback:100] Out: 2600 [Urine:2600] Intake/Output from this shift:    Labs:  Recent Labs  12/02/13 0524 12/03/13 0454 12/04/13 0440  WBC 7.6 7.2 15.0*  HGB 9.9* 10.5* 10.3*  PLT 267 235 278  CREATININE 1.46* 1.29 1.14   Estimated Creatinine Clearance: 58 ml/min (by C-G formula based on Cr of 1.14). No results found for this basename: VANCOTROUGH, VANCOPEAK, VANCORANDOM, GENTTROUGH, GENTPEAK, GENTRANDOM, TOBRATROUGH, TOBRAPEAK, TOBRARND, AMIKACINPEAK, AMIKACINTROU, AMIKACIN,  in the last 72 hoursDISREGARD ABOVE VANCOMYCIN [place].    Microbiology: Recent Results (from the past 720 hour(s))  MRSA PCR SCREENING     Status: None   Collection Time    11/26/13  9:40 PM      Result Value Ref Range Status   MRSA by PCR NEGATIVE  NEGATIVE Final   Comment:            The GeneXpert MRSA Assay (FDA     approved for NASAL specimens     only), is one component of a     comprehensive MRSA colonization     surveillance program. It is not     intended to diagnose MRSA     infection nor to guide or     monitor treatment for     MRSA infections.  CLOSTRIDIUM DIFFICILE BY PCR     Status: None   Collection Time    12/04/13  3:45 AM      Result Value Ref Range Status   C difficile by pcr NEGATIVE  NEGATIVE Final   Medical History: Past Medical History  Diagnosis Date  . Essential hypertension, benign   . Coronary atherosclerosis of native coronary artery     a. CABG/MVR/AVR-2003 (#25 St. Jude/#21 St. Jude);  anticoagulation; b. negative stress nuclear-2005;  c. 01/2013 NSTEMI/Cath/PCI: LM nl, LAD 50p, 40-8m, D1 50ost, LCX 23m, RCA 50/50m (2.5x20 Promus DES), 50d, VG->Diag 100, VG->PDA 100, VG->OM 100, LIMA->LAD ok.  . Epistaxis     Requiring cautery & aterial ligation-09/2009  . GERD (gastroesophageal reflux disease)   . Hyperlipemia   . Abnormal LFTs     Possible cirrhosis  . Chronic anticoagulation   . Valvular heart disease     a. 2003: MVR/AVR-2003 (#25 St. Jude/#21 St. Jude);  b. 01/2013 Echo: EF 55%, Mech AVR mean grad 12, Mech MVR mean grad 6.  . Tobacco abuse     45 pack years  . Fasting hyperglycemia   . Nephrolithiasis   . Diverticulosis   . Hyponatremia   . Chronic diastolic CHF (congestive heart failure)   . Chronic renal disease   . Hemolytic anemia   . ETOH abuse   . Atrial fibrillation 01/16/2013    On coumadin DCCV 07/2013.   Marland Kitchen COPD (chronic obstructive pulmonary disease)    Medications:  Scheduled:  . diltiazem  60 mg Oral 4 times per day  . feeding supplement (ENSURE COMPLETE)  237 mL Oral BID BM  . feeding supplement (PRO-STAT SUGAR FREE 64)  30 mL Oral BID  . furosemide  60 mg  Intravenous Q12H  . insulin aspart  0-9 Units Subcutaneous TID WC  . ipratropium-albuterol  3 mL Nebulization TID  . methylPREDNISolone (SOLU-MEDROL) injection  40 mg Intravenous Q12H  . metoprolol tartrate  50 mg Per Tube BID  . nicotine  14 mg Transdermal Daily  . pantoprazole (PROTONIX) IV  40 mg Intravenous Daily  . piperacillin-tazobactam (ZOSYN)  IV  3.375 g Intravenous Q8H  . sodium chloride  3 mL Intravenous Q12H  . thiamine  100 mg Oral Daily   Or  . thiamine  100 mg Intravenous Daily  . vancomycin  750 mg Intravenous Q18H  . warfarin  7.5 mg Per Tube Once  . Warfarin - Pharmacist Dosing Inpatient   Does not apply Q24H   Assessment: 68 yo M admitted with HF exacerbation on 5/24.  He developed hypoxia & dyspnea overnight requiring intubation.  CXR today can not exclude PNA.   WBC is normal & afebrile.  Empiric, broad-spectrum antibiotics were initiated for possible hospital- acquired infection. Pt was extubated yesterday.   WBC is elevated today, however patient is otherwise clinically improved. CXR still cannot exclude infiltrates.  No cx data available.  Vancomycin dose was adjusted 5/28.  Renal function has returned to patient's baseline.    Vancomycin 5/26>> Zosyn 5/26>>  Goal of Therapy:  Vancomycin trough level 15-20 mcg/ml  Plan:  Continue Zosyn 3.375gm IV Q8h to be infused over 4hrs Continue Vancomycin 750mg  IV q18h Weekly Vancomycin trough level while patient on Vancomycin Monitor renal function and cx data  Duration of therapy per MD- Patient has completed 7 days of broad-spectrum antibiotics.  Consider d/c antibiotics or de-escalate to oral therapy  [name] 12/04/2013,8:00 AM

## 2013-12-04 NOTE — Progress Notes (Signed)
Subjective: He feels better. He was able to be extubated yesterday. He has no other new complaints.  Objective: Vital signs in last 24 hours: Temp:  [97 F (36.1 C)-98 F (36.7 C)] 97 F (36.1 C) (06/01 0400) Pulse Rate:  [30-123] 51 (06/01 0615) Resp:  [13-23] 17 (06/01 0615) BP: (121-163)/(48-116) 157/59 mmHg (06/01 0615) SpO2:  [84 %-96 %] 91 % (06/01 0645) FiO2 (%):  [40 %] 40 % (05/31 0748) Weight:  [68.7 kg (151 lb 7.3 oz)] 68.7 kg (151 lb 7.3 oz) (06/01 0500) Weight change: 1.9 kg (4 lb 3 oz) Last BM Date: 12/04/13  Intake/Output from previous day: 05/31 0701 - 06/01 0700 In: 820 [IV Piggyback:100] Out: 2600 [Urine:2600]  PHYSICAL EXAM General appearance: alert, cooperative and mild distress Resp: rales bilaterally Cardio: His heart is still irregularly irregular with prosthetic heart valve sound GI: soft, non-tender; bowel sounds normal; no masses,  no organomegaly Extremities: extremities normal, atraumatic, no cyanosis or edema  Lab Results:    Basic Metabolic Panel:  Recent Labs  12/03/13 0454 12/04/13 0440  NA 145 142  K 2.9* 4.0  CL 95* 95*  CO2 39* 38*  GLUCOSE 189* 144*  BUN 65* 61*  CREATININE 1.29 1.14  CALCIUM 8.9 9.1   Liver Function Tests:  Recent Labs  12/03/13 0454  AST 29  ALT 16  ALKPHOS 69  BILITOT 0.6  PROT 6.2  ALBUMIN 3.1*   No results found for this basename: LIPASE, AMYLASE,  in the last 72 hours No results found for this basename: AMMONIA,  in the last 72 hours CBC:  Recent Labs  12/03/13 0454 12/04/13 0440  WBC 7.2 15.0*  NEUTROABS 6.9 14.5*  HGB 10.5* 10.3*  HCT 34.6* 34.5*  MCV 95.1 94.8  PLT 235 278   Cardiac Enzymes:  Recent Labs  12/01/13 0738 12/01/13 1322 12/01/13 1858  TROPONINI 0.90* 1.04* 0.84*   BNP: No results found for this basename: PROBNP,  in the last 72 hours D-Dimer: No results found for this basename: DDIMER,  in the last 72 hours CBG:  Recent Labs  12/02/13 0732  12/02/13 1144 12/02/13 1620 12/02/13 2001 12/03/13 0404 12/03/13 0741  GLUCAP 138* 153* 175* 152* 183* 156*   Hemoglobin A1C: No results found for this basename: HGBA1C,  in the last 72 hours Fasting Lipid Panel:  Recent Labs  12/04/13 0440  TRIG 77   Thyroid Function Tests: No results found for this basename: TSH, T4TOTAL, FREET4, T3FREE, THYROIDAB,  in the last 72 hours Anemia Panel: No results found for this basename: VITAMINB12, FOLATE, FERRITIN, TIBC, IRON, RETICCTPCT,  in the last 72 hours Coagulation:  Recent Labs  12/03/13 0454 12/04/13 0440  LABPROT 19.4* 21.2*  INR 1.69* 1.90*   Urine Drug Screen: Drugs of Abuse  No results found for this basename: labopia, cocainscrnur, labbenz, amphetmu, thcu, labbarb    Alcohol Level: No results found for this basename: ETH,  in the last 72 hours Urinalysis: No results found for this basename: COLORURINE, APPERANCEUR, LABSPEC, PHURINE, GLUCOSEU, HGBUR, BILIRUBINUR, KETONESUR, PROTEINUR, UROBILINOGEN, NITRITE, LEUKOCYTESUR,  in the last 72 hours Misc. Labs:  ABGS  Recent Labs  12/04/13 0432  PHART 7.453*  PO2ART 49.7*  TCO2 34.3  HCO3 37.5*   CULTURES Recent Results (from the past 240 hour(s))  MRSA PCR SCREENING     Status: None   Collection Time    11/26/13  9:40 PM      Result Value Ref Range Status   MRSA by  PCR NEGATIVE  NEGATIVE Final   Comment:            The GeneXpert MRSA Assay (FDA     approved for NASAL specimens     only), is one component of a     comprehensive MRSA colonization     surveillance program. It is not     intended to diagnose MRSA     infection nor to guide or     monitor treatment for     MRSA infections.  CLOSTRIDIUM DIFFICILE BY PCR     Status: None   Collection Time    12/04/13  3:45 AM      Result Value Ref Range Status   C difficile by pcr NEGATIVE  NEGATIVE Final   Studies/Results: Dg Chest Port 1 View  12/03/2013   CLINICAL DATA:  Respiratory failure  EXAM:  PORTABLE CHEST - 1 VIEW  COMPARISON:  12/02/2013, 11/26/2013  FINDINGS: Endotracheal tube with the tip 4.7 cm above the carina. Nasogastric tube coursing below the diaphragm excluded from the field of view.  Bilateral small pleural effusions. Bilateral diffuse interstitial thickening. Bibasilar hazy airspace disease likely reflecting atelectasis. No pneumothorax. Stable cardiomegaly. Prior CABG. Thoracic aortic atherosclerosis.  Unremarkable osseous structures.  IMPRESSION: 1. Endotracheal tube with the tip 4.7 cm above the carina. 2. Stable cardiomegaly. Bilateral small pleural effusions and bibasilar airspace disease concerning for CHF. Developing bibasilar infiltrates cannot be excluded.   Electronically Signed   By: Kathreen Devoid   On: 12/03/2013 07:29    Medications:  Prior to Admission:  Prescriptions prior to admission  Medication Sig Dispense Refill  . amLODipine (NORVASC) 10 MG tablet Take 10 mg by mouth at bedtime.      Marland Kitchen atorvastatin (LIPITOR) 40 MG tablet Take 40 mg by mouth daily.      . clopidogrel (PLAVIX) 75 MG tablet Take 1 tablet (75 mg total) by mouth daily with breakfast.  30 tablet  6  . ferrous sulfate 325 (65 FE) MG EC tablet Take 325 mg by mouth daily with breakfast.      . folic acid (FOLVITE) 1 MG tablet Take 1 tablet (1 mg total) by mouth daily.  30 tablet  4  . levalbuterol (XOPENEX) 0.63 MG/3ML nebulizer solution Take 3 mLs (0.63 mg total) by nebulization every 6 (six) hours as needed for wheezing or shortness of breath.  3 mL  4  . magnesium oxide (MAG-OX) 400 (241.3 MG) MG tablet Take 1 tablet (400 mg total) by mouth daily.  30 tablet  6  . metoprolol (LOPRESSOR) 100 MG tablet Take 1 tablet (100 mg total) by mouth 2 (two) times daily.  60 tablet  6  . Multiple Vitamin (MULTIVITAMIN WITH MINERALS) TABS Take 1 tablet by mouth daily.      . pantoprazole (PROTONIX) 40 MG tablet Take 1 tablet (40 mg total) by mouth daily.  30 tablet  0  . potassium chloride SA  (K-DUR,KLOR-CON) 20 MEQ tablet Take 20 mEq by mouth daily.      Marland Kitchen thiamine 100 MG tablet Take 1 tablet (100 mg total) by mouth daily.  30 tablet  4  . torsemide (DEMADEX) 20 MG tablet Take 2 tablets (40 mg total) by mouth 2 (two) times daily.  60 tablet  6  . warfarin (COUMADIN) 5 MG tablet Take 1 1/2 tablets (7.5 mg) on Mondays and  Fridays  take 1 tablet (5 mg) on Sunday, Tuesday, Wednesday Thursday and Saturday      .  nitroGLYCERIN (NITROSTAT) 0.4 MG SL tablet Place 1 tablet (0.4 mg total) under the tongue every 5 (five) minutes x 3 doses as needed for chest pain.  25 tablet  3   Scheduled: . diltiazem  60 mg Oral 4 times per day  . feeding supplement (ENSURE COMPLETE)  237 mL Oral BID BM  . feeding supplement (PRO-STAT SUGAR FREE 64)  30 mL Oral BID  . furosemide  60 mg Intravenous Q12H  . insulin aspart  0-9 Units Subcutaneous TID WC  . ipratropium-albuterol  3 mL Nebulization TID  . methylPREDNISolone (SOLU-MEDROL) injection  40 mg Intravenous Q12H  . metoprolol tartrate  50 mg Per Tube BID  . nicotine  14 mg Transdermal Daily  . pantoprazole (PROTONIX) IV  40 mg Intravenous Daily  . piperacillin-tazobactam (ZOSYN)  IV  3.375 g Intravenous Q8H  . sodium chloride  3 mL Intravenous Q12H  . thiamine  100 mg Oral Daily   Or  . thiamine  100 mg Intravenous Daily  . vancomycin  750 mg Intravenous Q18H  . Warfarin - Pharmacist Dosing Inpatient   Does not apply Q24H   Continuous:  [facility]:ENIDPO chloride, acetaminophen, albuterol, fentaNYL, LORazepam, sodium chloride  Assesment: he was admitted with some sort of tonic clonic activity it was thought to be related to hypoxia. He then rapidly developed worsening problems with his breathing which culminated in him needing to be intubated and started on mechanical ventilation. He was extubated yesterday. It appeared that the major part of his problem was acute on chronic diastolic heart failure. He is down 11 kg.  He has severe COPD which  contributed to his respiratory failure.  He has been in atrial fibrillation and had a previous cardioversion but went back into atrial fibrillation with rapid ventricular response during his severe illness  He is known to have coronary artery occlusive disease and his troponin rose somewhat after he had atrial fibrillation with rapid ventricular  He has had mitral and aortic valve replacements and is chronically anticoagulated. Anticoagulation is being monitored by pharmacy  Principal Problem:   Seizure-like activity Active Problems:   ANEMIA   TOBACCO ABUSE   CHRONIC OBSTRUCTIVE PULMONARY DISEASE   MITRAL VALVE REPLACEMENT, HX OF   AORTIC VALVE REPLACEMENT, HX OF   Chronic anticoagulation   Coronary atherosclerosis of unspecified type of vessel, native or graft   Acute on chronic diastolic CHF (congestive heart failure), NYHA class 4   Hyponatremia   Atrial fibrillation status post cardioversion   Alcohol abuse   Atrial fibrillation with rapid ventricular response   Hypokalemia   Acute-on-chronic respiratory failure    Plan: continue current treatments. PT consultation. Advance his diet.     LOS: 8 days   Alonza Bogus 12/04/2013, 7:37 AM

## 2013-12-05 DIAGNOSIS — Z5181 Encounter for therapeutic drug level monitoring: Secondary | ICD-10-CM

## 2013-12-05 LAB — GLUCOSE, CAPILLARY
GLUCOSE-CAPILLARY: 113 mg/dL — AB (ref 70–99)
GLUCOSE-CAPILLARY: 155 mg/dL — AB (ref 70–99)
GLUCOSE-CAPILLARY: 145 mg/dL — AB (ref 70–99)
Glucose-Capillary: 166 mg/dL — ABNORMAL HIGH (ref 70–99)
Glucose-Capillary: 99 mg/dL (ref 70–99)

## 2013-12-05 LAB — BLOOD GAS, ARTERIAL
Acid-Base Excess: 12.2 mmol/L — ABNORMAL HIGH (ref 0.0–2.0)
Bicarbonate: 36.8 mEq/L — ABNORMAL HIGH (ref 20.0–24.0)
Drawn by: 38235
FIO2: 0.4 %
O2 Saturation: 93.6 %
PATIENT TEMPERATURE: 37
PCO2 ART: 53.1 mmHg — AB (ref 35.0–45.0)
PH ART: 7.455 — AB (ref 7.350–7.450)
PO2 ART: 70.9 mmHg — AB (ref 80.0–100.0)
TCO2: 33.5 mmol/L (ref 0–100)

## 2013-12-05 LAB — PROTIME-INR
INR: 2.14 — ABNORMAL HIGH (ref 0.00–1.49)
PROTHROMBIN TIME: 23.2 s — AB (ref 11.6–15.2)

## 2013-12-05 MED ORDER — LEVOFLOXACIN 500 MG PO TABS
500.0000 mg | ORAL_TABLET | Freq: Every day | ORAL | Status: DC
  Administered 2013-12-05 – 2013-12-06 (×2): 500 mg via ORAL
  Filled 2013-12-05 (×2): qty 1

## 2013-12-05 MED ORDER — WARFARIN SODIUM 7.5 MG PO TABS
7.5000 mg | ORAL_TABLET | Freq: Once | ORAL | Status: AC
  Administered 2013-12-05: 7.5 mg via ORAL
  Filled 2013-12-05: qty 1

## 2013-12-05 MED ORDER — PREDNISONE 20 MG PO TABS
40.0000 mg | ORAL_TABLET | Freq: Every day | ORAL | Status: DC
  Administered 2013-12-05 – 2013-12-06 (×2): 40 mg via ORAL
  Filled 2013-12-05 (×2): qty 2

## 2013-12-05 NOTE — Progress Notes (Signed)
The patient was seen and examined, and I agree with the assessment and plan as documented above. HR better controlled. He feels much better and is willing to undergo PT. Await repeat BMET. Transferring to floor.

## 2013-12-05 NOTE — Progress Notes (Signed)
Consulting cardiologist:Koneswaran, Jamesetta So MD Primary Cardiologist: Carlyle Dolly MD  Subjective:    Feeling better. No chest pain or shortness of breath.  Objective:   Temp:  [97.6 F (36.4 C)-98.3 F (36.8 C)] 97.7 F (36.5 C) (06/02 0730) Pulse Rate:  [44-113] 44 (06/02 0600) Resp:  [9-24] 12 (06/02 0600) BP: (128-163)/(48-82) 159/75 mmHg (06/02 0600) SpO2:  [84 %-98 %] 98 % (06/02 0600) FiO2 (%):  [40 %] 40 % (06/01 1500) Weight:  [156 lb 8.4 oz (71 kg)] 156 lb 8.4 oz (71 kg) (06/02 0500) Last BM Date: 12/04/13  Filed Weights   12/03/13 0500 12/04/13 0500 12/05/13 0500  Weight: 147 lb 4.3 oz (66.8 kg) 151 lb 7.3 oz (68.7 kg) 156 lb 8.4 oz (71 kg)    Intake/Output Summary (Last 24 hours) at 12/05/13 0808 Last data filed at 12/05/13 0730  Gross per 24 hour  Intake   2737 ml  Output   2100 ml  Net    637 ml    Telemetry:Atrial fib, rates in the 70's and 80's.   Exam:  General: No acute distress.  HEENT: Conjunctiva and lids normal, oropharynx clear.  Lungs: Clear to auscultation, nonlabored.Diminished in the bases. Wearing O2.   Cardiac: No elevated JVP or bruits. RRR, no gallop or rub.   Abdomen: Normoactive bowel sounds, nontender, nondistended.  Extremities: No pitting edema, distal pulses full.  Neuropsychiatric: Alert and oriented x3, affect appropriate.   Lab Results:  Basic Metabolic Panel:  Recent Labs Lab 12/02/13 0524 12/03/13 0454 12/04/13 0440  NA 144 145 142  K 3.5* 2.9* 4.0  CL 94* 95* 95*  CO2 40* 39* 38*  GLUCOSE 122* 189* 144*  BUN 51* 65* 61*  CREATININE 1.46* 1.29 1.14  CALCIUM 8.9 8.9 9.1    Liver Function Tests:  Recent Labs Lab 11/30/13 0406 12/03/13 0454  AST 30 29  ALT 10 16  ALKPHOS 92 69  BILITOT 0.8 0.6  PROT 6.3 6.2  ALBUMIN 2.9* 3.1*    CBC:  Recent Labs Lab 12/02/13 0524 12/03/13 0454 12/04/13 0440  WBC 7.6 7.2 15.0*  HGB 9.9* 10.5* 10.3*  HCT 32.9* 34.6* 34.5*  MCV 95.1 95.1 94.8    PLT 267 235 278    Cardiac Enzymes:  Recent Labs Lab 12/01/13 0738 12/01/13 1322 12/01/13 1858  TROPONINI 0.90* 1.04* 0.84*    BNP:  Recent Labs  07/23/13 1008 08/14/13 1637 11/26/13 1826  PROBNP 8081.0* 5977.0* 12192.0*    Coagulation:  Recent Labs Lab 12/03/13 0454 12/04/13 0440 12/05/13 0532  INR 1.69* 1.90* 2.14*    Radiology: Dg Chest Port 1 View  12/04/2013   CLINICAL DATA:  Respiratory failure.  Recent extubation  EXAM: PORTABLE CHEST - 1 VIEW  COMPARISON:  12/03/2013  FINDINGS: Prior median sternotomy and valve replacement/CABG. Cardiomegaly. Bilateral lower lobe atelectasis or infiltrate slightly improved since prior study. Small effusions. Diffuse interstitial prominence could reflect interstitial edema, slightly improved since prior study.  IMPRESSION: Improving interstitial edema pattern and bibasilar atelectasis or infiltrates.  Small bilateral effusions.   Electronically Signed   By: Rolm Baptise M.D.   On: 12/04/2013 09:23      Medications:   Scheduled Medications: . atorvastatin  20 mg Oral q1800  . diltiazem  90 mg Oral 4 times per day  . feeding supplement (ENSURE COMPLETE)  237 mL Oral BID BM  . feeding supplement (PRO-STAT SUGAR FREE 64)  30 mL Oral BID  . insulin aspart  0-9 Units  Subcutaneous TID WC  . ipratropium-albuterol  3 mL Nebulization TID  . levofloxacin  500 mg Oral Daily  . metoprolol tartrate  50 mg Per Tube BID  . nicotine  14 mg Transdermal Daily  . pantoprazole  40 mg Oral Daily  . predniSONE  40 mg Oral Q breakfast  . sodium chloride  3 mL Intravenous Q12H  . thiamine  100 mg Oral Daily   Or  . thiamine  100 mg Intravenous Daily  . torsemide  40 mg Oral BID  . warfarin  7.5 mg Oral Once  . Warfarin - Pharmacist Dosing Inpatient   Does not apply Q24H    PRN Medications: sodium chloride, acetaminophen, albuterol, fentaNYL, loperamide, LORazepam, sodium chloride   Assessment and Plan:   1. Atrial fib with RVR:  Heart rate is much better controlled now with increased dose of diltiazem to 360 mg (90 mg Q 6 hours). He is going to move to telemetry and increase activity with PT. We will make dose adjustments to simplify CCB frequency closer to discharge once we see how his HR does with increased activity. INR is now becoming close therapeutic at 2.14 with mechanical valves and atrial fib. Appreciate pharmacy input and management.   2. Diastolic CHF: Appears compensated on assessment. Now on po torsemide with creatinine of 1.14 yesterday. Will repeat his BMET this am. Wt is up 5 lbs overnight from 151 to 156.. Will monitor this. F/C d/c'd yesterday.   3. Valvular Heart Disease: Status post [employer] mechanical AVR and MVR. Last echocardiogram was in January of this year with normally functioning valves.  On Coumadin chronically.   4.  Multivessel CAD: S/P CABG. Continue BB , statin started yesterday.   5. Deconditioning: Can contribute to heart rate with activity. PT is now to be started to assist. Monitor his response.     Phill Myron. Sobia Karger NP  12/05/2013, 8:08 AM

## 2013-12-05 NOTE — Progress Notes (Signed)
Subjective: He is awake and alert says he feels okay. He has no new complaints. He was evaluated by physical therapy yesterday and it was felt that he was a candidate for rehabilitation and he has agreed to do that.  Objective: Vital signs in last 24 hours: Temp:  [97.7 F (36.5 C)-98.3 F (36.8 C)] 97.7 F (36.5 C) (06/02 0730) Pulse Rate:  [44-113] 44 (06/02 0600) Resp:  [9-24] 12 (06/02 0600) BP: (128-163)/(48-82) 159/75 mmHg (06/02 0600) SpO2:  [84 %-98 %] 98 % (06/02 0600) FiO2 (%):  [40 %] 40 % (06/01 1500) Weight:  [71 kg (156 lb 8.4 oz)] 71 kg (156 lb 8.4 oz) (06/02 0500) Weight change: 2.3 kg (5 lb 1.1 oz) Last BM Date: 12/04/13  Intake/Output from previous day: 06/01 0701 - 06/02 0700 In: 2737 [P.O.:1997; I.V.:3; IV Piggyback:500] Out: 1800 [Urine:1800]  PHYSICAL EXAM General appearance: alert, cooperative and no distress Resp: clear to auscultation bilaterally Cardio: He is still in atrial fibrillation with a better controlled heart rate and with prosthetic aortic and mitral valve sound GI: soft, non-tender; bowel sounds normal; no masses,  no organomegaly Extremities: venous stasis dermatitis noted  Lab Results:    Basic Metabolic Panel:  Recent Labs  12/03/13 0454 12/04/13 0440  NA 145 142  K 2.9* 4.0  CL 95* 95*  CO2 39* 38*  GLUCOSE 189* 144*  BUN 65* 61*  CREATININE 1.29 1.14  CALCIUM 8.9 9.1   Liver Function Tests:  Recent Labs  12/03/13 0454  AST 29  ALT 16  ALKPHOS 69  BILITOT 0.6  PROT 6.2  ALBUMIN 3.1*   No results found for this basename: LIPASE, AMYLASE,  in the last 72 hours No results found for this basename: AMMONIA,  in the last 72 hours CBC:  Recent Labs  12/03/13 0454 12/04/13 0440  WBC 7.2 15.0*  NEUTROABS 6.9 14.5*  HGB 10.5* 10.3*  HCT 34.6* 34.5*  MCV 95.1 94.8  PLT 235 278   Cardiac Enzymes: No results found for this basename: CKTOTAL, CKMB, CKMBINDEX, TROPONINI,  in the last 72 hours BNP: No results  found for this basename: PROBNP,  in the last 72 hours D-Dimer: No results found for this basename: DDIMER,  in the last 72 hours CBG:  Recent Labs  12/03/13 0741 12/04/13 0733 12/04/13 1122 12/04/13 1628 12/04/13 2153 12/05/13 0734  GLUCAP 156* 142* 186* 159* 98 113*   Hemoglobin A1C: No results found for this basename: HGBA1C,  in the last 72 hours Fasting Lipid Panel:  Recent Labs  12/04/13 0440  TRIG 77   Thyroid Function Tests: No results found for this basename: TSH, T4TOTAL, FREET4, T3FREE, THYROIDAB,  in the last 72 hours Anemia Panel: No results found for this basename: VITAMINB12, FOLATE, FERRITIN, TIBC, IRON, RETICCTPCT,  in the last 72 hours Coagulation:  Recent Labs  12/04/13 0440 12/05/13 0532  LABPROT 21.2* 23.2*  INR 1.90* 2.14*   Urine Drug Screen: Drugs of Abuse  No results found for this basename: labopia, cocainscrnur, labbenz, amphetmu, thcu, labbarb    Alcohol Level: No results found for this basename: ETH,  in the last 72 hours Urinalysis: No results found for this basename: COLORURINE, APPERANCEUR, LABSPEC, PHURINE, GLUCOSEU, HGBUR, BILIRUBINUR, KETONESUR, PROTEINUR, UROBILINOGEN, NITRITE, LEUKOCYTESUR,  in the last 72 hours Misc. Labs:  ABGS  Recent Labs  12/05/13 0500  PHART 7.455*  PO2ART 70.9*  TCO2 33.5  HCO3 36.8*   CULTURES Recent Results (from the past 240 hour(s))  MRSA PCR  SCREENING     Status: None   Collection Time    11/26/13  9:40 PM      Result Value Ref Range Status   MRSA by PCR NEGATIVE  NEGATIVE Final   Comment:            The GeneXpert MRSA Assay (FDA     approved for NASAL specimens     only), is one component of a     comprehensive MRSA colonization     surveillance program. It is not     intended to diagnose MRSA     infection nor to guide or     monitor treatment for     MRSA infections.  CLOSTRIDIUM DIFFICILE BY PCR     Status: None   Collection Time    12/04/13  3:45 AM      Result Value Ref  Range Status   C difficile by pcr NEGATIVE  NEGATIVE Final   Studies/Results: Dg Chest Port 1 View  12/04/2013   CLINICAL DATA:  Respiratory failure.  Recent extubation  EXAM: PORTABLE CHEST - 1 VIEW  COMPARISON:  12/03/2013  FINDINGS: Prior median sternotomy and valve replacement/CABG. Cardiomegaly. Bilateral lower lobe atelectasis or infiltrate slightly improved since prior study. Small effusions. Diffuse interstitial prominence could reflect interstitial edema, slightly improved since prior study.  IMPRESSION: Improving interstitial edema pattern and bibasilar atelectasis or infiltrates.  Small bilateral effusions.   Electronically Signed   By: Rolm Baptise M.D.   On: 12/04/2013 09:23    Medications:  Prior to Admission:  Prescriptions prior to admission  Medication Sig Dispense Refill  . amLODipine (NORVASC) 10 MG tablet Take 10 mg by mouth at bedtime.      Marland Kitchen atorvastatin (LIPITOR) 40 MG tablet Take 40 mg by mouth daily.      . clopidogrel (PLAVIX) 75 MG tablet Take 1 tablet (75 mg total) by mouth daily with breakfast.  30 tablet  6  . ferrous sulfate 325 (65 FE) MG EC tablet Take 325 mg by mouth daily with breakfast.      . folic acid (FOLVITE) 1 MG tablet Take 1 tablet (1 mg total) by mouth daily.  30 tablet  4  . levalbuterol (XOPENEX) 0.63 MG/3ML nebulizer solution Take 3 mLs (0.63 mg total) by nebulization every 6 (six) hours as needed for wheezing or shortness of breath.  3 mL  4  . magnesium oxide (MAG-OX) 400 (241.3 MG) MG tablet Take 1 tablet (400 mg total) by mouth daily.  30 tablet  6  . metoprolol (LOPRESSOR) 100 MG tablet Take 1 tablet (100 mg total) by mouth 2 (two) times daily.  60 tablet  6  . Multiple Vitamin (MULTIVITAMIN WITH MINERALS) TABS Take 1 tablet by mouth daily.      . pantoprazole (PROTONIX) 40 MG tablet Take 1 tablet (40 mg total) by mouth daily.  30 tablet  0  . potassium chloride SA (K-DUR,KLOR-CON) 20 MEQ tablet Take 20 mEq by mouth daily.      Marland Kitchen thiamine 100  MG tablet Take 1 tablet (100 mg total) by mouth daily.  30 tablet  4  . torsemide (DEMADEX) 20 MG tablet Take 2 tablets (40 mg total) by mouth 2 (two) times daily.  60 tablet  6  . warfarin (COUMADIN) 5 MG tablet Take 5-7.5 mg by mouth daily. Takes 7.5mg  on Mondays & 5mg  all other days of week      . nitroGLYCERIN (NITROSTAT) 0.4 MG SL tablet Place  1 tablet (0.4 mg total) under the tongue every 5 (five) minutes x 3 doses as needed for chest pain.  25 tablet  3   Scheduled: . atorvastatin  20 mg Oral q1800  . diltiazem  90 mg Oral 4 times per day  . feeding supplement (ENSURE COMPLETE)  237 mL Oral BID BM  . feeding supplement (PRO-STAT SUGAR FREE 64)  30 mL Oral BID  . insulin aspart  0-9 Units Subcutaneous TID WC  . ipratropium-albuterol  3 mL Nebulization TID  . levofloxacin  500 mg Oral Daily  . metoprolol tartrate  50 mg Per Tube BID  . nicotine  14 mg Transdermal Daily  . pantoprazole  40 mg Oral Daily  . predniSONE  40 mg Oral Q breakfast  . sodium chloride  3 mL Intravenous Q12H  . thiamine  100 mg Oral Daily   Or  . thiamine  100 mg Intravenous Daily  . torsemide  40 mg Oral BID  . warfarin  7.5 mg Oral Once  . Warfarin - Pharmacist Dosing Inpatient   Does not apply Q24H   Continuous:  BEE:FEOFHQ chloride, acetaminophen, albuterol, fentaNYL, loperamide, LORazepam, sodium chloride  Assesment: He was admitted with some sort of jerking episode it is not felt to be a seizure. He developed severe shortness of breath soon after admission and eventually had to be intubated and placed on mechanical ventilation. He was able to be extubated approximately 48 hours ago and has done well.  He had what appears to be acute pulmonary edema and has had significant diuresis and is improved.  At baseline he has severe COPD which contributed to his breathing problems he seems pretty stable with that now.  He is known to have coronary artery occlusive disease and his troponin went up during his  episode of acute respiratory failure and atrial fibrillation with rapid ventricular response.  He has atrial fibrillation. When he came to the hospital he was in sinus rhythm but has had previous episodes of atrial fibrillation. He developed atrial fibrillation with rapid ventricular response and his heart rate is better controlled now but he is still in atrial fib.  He has valvular heart disease with mitral and aortic valve replacements and is on chronic anticoagulations for that  He is severely deconditioned and will require  Rehabilitation  He has been anemic which is probably multifactorial and stable  He has chronic renal failure and that is stable and somewhat improved. Principal Problem:   Seizure-like activity Active Problems:   ANEMIA   TOBACCO ABUSE   CHRONIC OBSTRUCTIVE PULMONARY DISEASE   MITRAL VALVE REPLACEMENT, HX OF   AORTIC VALVE REPLACEMENT, HX OF   Chronic anticoagulation   Coronary atherosclerosis of unspecified type of vessel, native or graft   Acute on chronic diastolic CHF (congestive heart failure), NYHA class 4   Hyponatremia   Atrial fibrillation status post cardioversion   Alcohol abuse   Atrial fibrillation with rapid ventricular response   Hypokalemia   Acute-on-chronic respiratory failure    Plan: Discontinue IV antibiotics. Discontinue IV steroids. Transfer him from the intensive care unit.    LOS: 9 days   Alonza Bogus 12/05/2013, 8:13 AM

## 2013-12-05 NOTE — Progress Notes (Signed)
Pt a/o.vss. Up in the recliner. Denies any distress. Report called to A.Araceli Bouche, RN. Pt to be transferred to room 327 via wheelchair and nursing staff.

## 2013-12-05 NOTE — Progress Notes (Signed)
ANTICOAGULATION CONSULT NOTE  Pharmacy Consult for Coumadin  Indication: Afib + St Jude's AVR/MVR   No Known Allergies  Patient Measurements: Height: 5\' 7"  (170.2 cm) Weight: 156 lb 8.4 oz (71 kg) IBW/kg (Calculated) : 66.1  Vital Signs: Temp: 98.3 F (36.8 C) (06/02 0400) Temp src: Oral (06/02 0400) BP: 159/75 mmHg (06/02 0600) Pulse Rate: 44 (06/02 0600)  Labs:  Recent Labs  12/03/13 0454 12/04/13 0440 12/05/13 0532  HGB 10.5* 10.3*  --   HCT 34.6* 34.5*  --   PLT 235 278  --   LABPROT 19.4* 21.2* 23.2*  INR 1.69* 1.90* 2.14*  CREATININE 1.29 1.14  --    Estimated Creatinine Clearance: 58 ml/min (by C-G formula based on Cr of 1.14).  Medical History: Past Medical History  Diagnosis Date  . Essential hypertension, benign   . Coronary atherosclerosis of native coronary artery     a. CABG/MVR/AVR-2003 (#25 St. Jude/#21 St. Jude); anticoagulation; b. negative stress nuclear-2005;  c. 01/2013 NSTEMI/Cath/PCI: LM nl, LAD 50p, 40-26m, D1 50ost, LCX 53m, RCA 50/109m (2.5x20 Promus DES), 50d, VG->Diag 100, VG->PDA 100, VG->OM 100, LIMA->LAD ok.  . Epistaxis     Requiring cautery & aterial ligation-09/2009  . GERD (gastroesophageal reflux disease)   . Hyperlipemia   . Abnormal LFTs     Possible cirrhosis  . Chronic anticoagulation   . Valvular heart disease     a. 2003: MVR/AVR-2003 (#25 St. Jude/#21 St. Jude);  b. 01/2013 Echo: EF 55%, Mech AVR mean grad 12, Mech MVR mean grad 6.  . Tobacco abuse     45 pack years  . Fasting hyperglycemia   . Nephrolithiasis   . Diverticulosis   . Hyponatremia   . Chronic diastolic CHF (congestive heart failure)   . Chronic renal disease   . Hemolytic anemia   . ETOH abuse   . Atrial fibrillation 01/16/2013    On coumadin DCCV 07/2013.   Marland Kitchen COPD (chronic obstructive pulmonary disease)    Medications:  Prescriptions prior to admission  Medication Sig Dispense Refill  . amLODipine (NORVASC) 10 MG tablet Take 10 mg by mouth at  bedtime.      Marland Kitchen atorvastatin (LIPITOR) 40 MG tablet Take 40 mg by mouth daily.      . clopidogrel (PLAVIX) 75 MG tablet Take 1 tablet (75 mg total) by mouth daily with breakfast.  30 tablet  6  . ferrous sulfate 325 (65 FE) MG EC tablet Take 325 mg by mouth daily with breakfast.      . folic acid (FOLVITE) 1 MG tablet Take 1 tablet (1 mg total) by mouth daily.  30 tablet  4  . levalbuterol (XOPENEX) 0.63 MG/3ML nebulizer solution Take 3 mLs (0.63 mg total) by nebulization every 6 (six) hours as needed for wheezing or shortness of breath.  3 mL  4  . magnesium oxide (MAG-OX) 400 (241.3 MG) MG tablet Take 1 tablet (400 mg total) by mouth daily.  30 tablet  6  . metoprolol (LOPRESSOR) 100 MG tablet Take 1 tablet (100 mg total) by mouth 2 (two) times daily.  60 tablet  6  . Multiple Vitamin (MULTIVITAMIN WITH MINERALS) TABS Take 1 tablet by mouth daily.      . pantoprazole (PROTONIX) 40 MG tablet Take 1 tablet (40 mg total) by mouth daily.  30 tablet  0  . potassium chloride SA (K-DUR,KLOR-CON) 20 MEQ tablet Take 20 mEq by mouth daily.      Marland Kitchen thiamine 100 MG  tablet Take 1 tablet (100 mg total) by mouth daily.  30 tablet  4  . torsemide (DEMADEX) 20 MG tablet Take 2 tablets (40 mg total) by mouth 2 (two) times daily.  60 tablet  6  . warfarin (COUMADIN) 5 MG tablet Take 5-7.5 mg by mouth daily. Takes 7.5mg  on Mondays & 5mg  all other days of week      . nitroGLYCERIN (NITROSTAT) 0.4 MG SL tablet Place 1 tablet (0.4 mg total) under the tongue every 5 (five) minutes x 3 doses as needed for chest pain.  25 tablet  3   Assessment: 68yo male on chronic Coumadin PTA for h/o mitral and aortic valve replacement + AFib.  Recorded home dose (listed above) varies slightly from outpatient anticoag flow-sheet which states 5mg  daily except 7.5mg  on Mon.  Will need to clarify with patient once able.    INR was therapeutic on admission, but trended above goal range & dose held x 2 days.  INR now sub-therapeutic.  Pt was  extubated. Tube feedings were changed to diet + Ensure supplements.  HF exacerbation has improved. No bleeding noted.    Goal of Therapy:  INR 2.5 - 3.5    Plan:   Coumadin 7.5mg  po x1 today  INR daily  Clarify outpatient Coumadin regimen with patient when able  [employer] 12/05/2013,7:39 AM

## 2013-12-05 NOTE — Clinical Social Work Placement (Signed)
Clinical Social Work Department CLINICAL SOCIAL WORK PLACEMENT NOTE 12/05/2013  Patient:  [name], [name]  Account Number:  [id number] Admit date:  11/26/2013  Clinical Social Worker:  [name], LCSW  Date/time:  12/05/2013 09:22 AM  Clinical Social Work is seeking post-discharge placement for this patient at the following level of care:   SKILLED NURSING   (*CSW will update this form in Epic as items are completed)   12/05/2013  Patient/family provided with [place] Department of Clinical Social Work's list of facilities offering this level of care within the geographic area requested by the patient (or if unable, by the patient's family).  12/05/2013  Patient/family informed of their freedom to choose among providers that offer the needed level of care, that participate in Medicare, Medicaid or managed care program needed by the patient, have an available bed and are willing to accept the patient.  12/05/2013  Patient/family informed of [facility]' ownership interest in [facility], as well as of the fact that they are under no obligation to receive care at this facility.  PASARR submitted to EDS on 12/04/2013 PASARR number received from EDS on 12/04/2013  FL2 transmitted to all facilities in geographic area requested by pt/family on  12/05/2013 FL2 transmitted to all facilities within larger geographic area on   Patient informed that his/her managed care company has contracts with or will negotiate with  certain facilities, including the following:     Patient/family informed of bed offers received:   Patient chooses bed at  Physician recommends and patient chooses bed at    Patient to be transferred to  on   Patient to be transferred to facility by   The following physician request were entered in Epic:   Additional Comments:  [name], [place]

## 2013-12-05 NOTE — Clinical Social Work Psychosocial (Signed)
Clinical Social Work Department BRIEF PSYCHOSOCIAL ASSESSMENT 12/05/2013  Patient:  [name], [name]     Account Number:  [id number]     Admit date:  11/26/2013  Clinical Social Worker:  Wyatt Haste  Date/Time:  12/05/2013 09:23 AM  Referred by:  CSW  Date Referred:  12/05/2013 Referred for  SNF Placement   Other Referral:   Interview type:  Patient Other interview type:    PSYCHOSOCIAL DATA Living Status:  ALONE Admitted from facility:   Level of care:   Primary support name:  [name] Primary support relationship to patient:  CHILD, ADULT Degree of support available:   supportive per pt    CURRENT CONCERNS Current Concerns  Post-Acute Placement   Other Concerns:    SOCIAL WORK ASSESSMENT / PLAN CSW met with pt at bedside. Pt alert and oriented. Reports he lives alone, but has good support from his daughter [name] and granddaughter [name]. He states they live nearby and primarily assist him with transportation as he has stopped driving. Pt manages fine at home by his report. He walks independently at baseline and manages all of the household tasks on his own. PT evaluated pt yesterday and recommendation is for SNF. CSW discussed this with pt and he is agreeable. He would like to stay in [place] if possible. Aware of possible copays. SNF list left in room. Pt's motivation to go to SNF is due to his desire to see his granddaughter graduate in a few weeks.    Chart indicates pt is a heavy drinker. He was very honest about this with CSW and admits he drinks daily until he doesn't want to anymore. Sometimes this is 6, sometimes 12 beers. Pt does not feel that drinking is a problem for him. 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 He reports when he was in the [employer] and was overseas he drank the most in order to cope with the combat in [place]. Now he feels he still drinks to erase memories. He reports his wife was his therapy until she died in 2006/09/28.  Pt states that she saved his life. CSW discussed therapy with pt for past experiences as well as ETOH use. He states this is not necessary. Pt has no desire to stop drinking. He said he enjoys it too much to quit long term.   Assessment/plan status:  Psychosocial Support/Ongoing Assessment of Needs Other assessment/ plan:   Information/referral to community resources:   SNF list  pt refuses substance abuse treatment referrals    PATIENT'S/FAMILY'S RESPONSE TO PLAN OF CARE: Pt reports positive feelings regarding [facility] for rehab. CSW will initiate bed search and follow up with bed offers when available.       [name], [place]

## 2013-12-06 DIAGNOSIS — N183 Chronic kidney disease, stage 3 unspecified: Secondary | ICD-10-CM

## 2013-12-06 LAB — CBC WITH DIFFERENTIAL/PLATELET
Basophils Absolute: 0 10*3/uL (ref 0.0–0.1)
Basophils Relative: 0 % (ref 0–1)
EOS ABS: 0.2 10*3/uL (ref 0.0–0.7)
Eosinophils Relative: 1 % (ref 0–5)
HEMATOCRIT: 35.3 % — AB (ref 39.0–52.0)
HEMOGLOBIN: 10.9 g/dL — AB (ref 13.0–17.0)
LYMPHS ABS: 0.8 10*3/uL (ref 0.7–4.0)
Lymphocytes Relative: 6 % — ABNORMAL LOW (ref 12–46)
MCH: 28.8 pg (ref 26.0–34.0)
MCHC: 30.9 g/dL (ref 30.0–36.0)
MCV: 93.1 fL (ref 78.0–100.0)
MONO ABS: 0.7 10*3/uL (ref 0.1–1.0)
MONOS PCT: 5 % (ref 3–12)
Neutro Abs: 12.6 10*3/uL — ABNORMAL HIGH (ref 1.7–7.7)
Neutrophils Relative %: 88 % — ABNORMAL HIGH (ref 43–77)
Platelets: 301 10*3/uL (ref 150–400)
RBC: 3.79 MIL/uL — AB (ref 4.22–5.81)
RDW: 15.5 % (ref 11.5–15.5)
WBC: 14.3 10*3/uL — ABNORMAL HIGH (ref 4.0–10.5)

## 2013-12-06 LAB — PROTIME-INR
INR: 2.76 — ABNORMAL HIGH (ref 0.00–1.49)
PROTHROMBIN TIME: 28.2 s — AB (ref 11.6–15.2)

## 2013-12-06 LAB — GLUCOSE, CAPILLARY
Glucose-Capillary: 99 mg/dL (ref 70–99)
Glucose-Capillary: 80 mg/dL (ref 70–99)
Glucose-Capillary: 158 mg/dL — ABNORMAL HIGH (ref 70–99)

## 2013-12-06 LAB — BASIC METABOLIC PANEL
BUN: 67 mg/dL — ABNORMAL HIGH (ref 6–23)
CHLORIDE: 91 meq/L — AB (ref 96–112)
CO2: 40 mEq/L (ref 19–32)
Calcium: 9.3 mg/dL (ref 8.4–10.5)
Creatinine, Ser: 1.32 mg/dL (ref 0.50–1.35)
GFR calc Af Amer: 62 mL/min — ABNORMAL LOW (ref >90)
GFR, EST NON AFRICAN AMERICAN: 54 mL/min — AB (ref >90)
GLUCOSE: 81 mg/dL (ref 70–99)
Potassium: 3.9 mEq/L (ref 3.7–5.3)
Sodium: 139 mEq/L (ref 137–147)

## 2013-12-06 MED ORDER — INSULIN ASPART 100 UNIT/ML ~~LOC~~ SOLN: 0.0000 [IU] | Freq: Three times a day (TID) | SUBCUTANEOUS | Status: DC

## 2013-12-06 MED ORDER — LORAZEPAM 0.5 MG PO TABS: 0.5000 mg | ORAL_TABLET | Freq: Three times a day (TID) | ORAL | Status: DC | PRN

## 2013-12-06 MED ORDER — ALBUTEROL SULFATE (2.5 MG/3ML) 0.083% IN NEBU: 2.5000 mg | INHALATION_SOLUTION | RESPIRATORY_TRACT | Status: DC | PRN

## 2013-12-06 MED ORDER — ENSURE COMPLETE PO LIQD: 237.0000 mL | Freq: Two times a day (BID) | ORAL | Status: DC

## 2013-12-06 MED ORDER — NICOTINE 14 MG/24HR TD PT24: 14.0000 mg | MEDICATED_PATCH | Freq: Every day | TRANSDERMAL | Status: DC

## 2013-12-06 MED ORDER — TORSEMIDE 20 MG PO TABS: 40.0000 mg | ORAL_TABLET | Freq: Every morning | ORAL | Status: DC

## 2013-12-06 MED ORDER — DILTIAZEM HCL 90 MG PO TABS: 90.0000 mg | ORAL_TABLET | Freq: Four times a day (QID) | ORAL | Status: DC

## 2013-12-06 MED ORDER — LOPERAMIDE HCL 2 MG PO CAPS: 4.0000 mg | ORAL_CAPSULE | Freq: Two times a day (BID) | ORAL | Status: AC | PRN

## 2013-12-06 MED ORDER — LEVOFLOXACIN 500 MG PO TABS: 500.0000 mg | ORAL_TABLET | Freq: Every day | ORAL | Status: DC

## 2013-12-06 MED ORDER — ACETAMINOPHEN 650 MG RE SUPP: 650.0000 mg | Freq: Four times a day (QID) | RECTAL | Status: DC | PRN

## 2013-12-06 MED ORDER — PRO-STAT SUGAR FREE PO LIQD: 30.0000 mL | Freq: Two times a day (BID) | ORAL | Status: DC

## 2013-12-06 MED ORDER — PREDNISONE 20 MG PO TABS: 40.0000 mg | ORAL_TABLET | Freq: Every day | ORAL | Status: DC

## 2013-12-06 MED ORDER — WARFARIN SODIUM 5 MG PO TABS: 5.0000 mg | ORAL_TABLET | Freq: Once | ORAL | Status: DC

## 2013-12-06 MED ORDER — TORSEMIDE 20 MG PO TABS: 20.0000 mg | ORAL_TABLET | Freq: Every evening | ORAL | Status: DC

## 2013-12-06 NOTE — Progress Notes (Signed)
Subjective:  Feeling better, starting to move around. Awaiting PT. No cardiac complaints.  Objective:  Vital Signs in the last 24 hours: Temp:  [97.5 F (36.4 C)-98.4 F (36.9 C)] 97.5 F (36.4 C) (06/03 0445) Pulse Rate:  [68-87] 68 (06/03 0445) Resp:  [14-21] 20 (06/03 0445) BP: (125-166)/(51-75) 166/75 mmHg (06/03 0445) SpO2:  [92 %-96 %] 96 % (06/03 0445) Weight:  [146 lb 12.8 oz (66.588 kg)] 146 lb 12.8 oz (66.588 kg) (06/03 0445)  Intake/Output from previous day: 06/02 0701 - 06/03 0700 In: 240 [P.O.:240] Out: 2550 [Urine:2550] Intake/Output from this shift: Total I/O In: -  Out: 550 [Urine:550]  Physical Exam: NECK: Without JVD, HJR, or bruit LUNGS: Decreased breath sounds throughout but Clear anterior, posterior, lateral HEART:Irregular rate and rhythm, valvular clicks with 1/6 systolic murmur at the left sternal border,no gallop, rub, bruit, thrill, or heave EXTREMITIES: Without cyanosis, clubbing, or edema  Lab Results:  Recent Labs  12/04/13 0440 12/06/13 0622  WBC 15.0* 14.3*  HGB 10.3* 10.9*  PLT 278 301    Recent Labs  12/04/13 0440 12/06/13 0622  NA 142 139  K 4.0 3.9  CL 95* 91*  CO2 38* 40*  GLUCOSE 144* 81  BUN 61* 67*  CREATININE 1.14 1.32   No results found for this basename: TROPONINI, CK, MB,  in the last 72 hours Hepatic Function Panel No results found for this basename: PROT, ALBUMIN, AST, ALT, ALKPHOS, BILITOT, BILIDIR, IBILI,  in the last 72 hours No results found for this basename: CHOL,  in the last 72 hours No results found for this basename: PROTIME,  in the last 72 hours  Imaging:   Cardiac Studies:  Assessment/Plan:  Atrial fib with RVR: Heart rate is much better controlled now with increased dose of diltiazem 90 mg Q 6 hours.Increase activity with PT.   2. Diastolic CHF: compensated now on po torsemide 40mg  po BID with creatinine of 1.32. I/O's negative 2310 cc overnight.    3. Valvular Heart Disease: Status post  St. Jude mechanical AVR and MVR. Last echocardiogram was in January of this year with normally functioning valves.  On Coumadin chronically.   4.  Multivessel CAD: S/P CABG. Continue BB , statin started yesterday.   5. Deconditioning: PT is now to be started to assist.     LOS: 10 days    Imogene Burn 12/06/2013, 8:02 AM

## 2013-12-06 NOTE — Discharge Summary (Signed)
Physician Discharge Summary  Patient ID: [name] MRN: [id number] DOB/AGE: 02-17-1946 68 y.o. Primary Care Physician:Braidyn Peace L, MD Admit date: 11/26/2013 Discharge date: 12/06/2013    Discharge Diagnoses:  Ventilator dependent respiratory failure Principal Problem:   Seizure-like activity Active Problems:   ANEMIA   TOBACCO ABUSE   CHRONIC OBSTRUCTIVE PULMONARY DISEASE   MITRAL VALVE REPLACEMENT, HX OF   AORTIC VALVE REPLACEMENT, HX OF   Chronic anticoagulation   Coronary atherosclerosis of unspecified type of vessel, native or graft   Acute on chronic diastolic CHF (congestive heart failure), NYHA class 4   Hyponatremia   Atrial fibrillation status post cardioversion   Alcohol abuse   Atrial fibrillation with rapid ventricular response   Hypokalemia   Acute-on-chronic respiratory failure chronic renal failure    Medication List    STOP taking these medications       amLODipine 10 MG tablet  Commonly known as:  NORVASC      TAKE these medications       acetaminophen 650 MG suppository  Commonly known as:  TYLENOL  Place 1 suppository (650 mg total) rectally every 6 (six) hours as needed for fever or mild pain.     albuterol (2.5 MG/3ML) 0.083% nebulizer solution  Commonly known as:  PROVENTIL  Take 3 mLs (2.5 mg total) by nebulization every 2 (two) hours as needed for wheezing.     atorvastatin 40 MG tablet  Commonly known as:  LIPITOR  Take 40 mg by mouth daily.     clopidogrel 75 MG tablet  Commonly known as:  PLAVIX  Take 1 tablet (75 mg total) by mouth daily with breakfast.     diltiazem 90 MG tablet  Commonly known as:  CARDIZEM  Take 1 tablet (90 mg total) by mouth every 6 (six) hours.     feeding supplement (ENSURE COMPLETE) Liqd  Take 237 mLs by mouth 2 (two) times daily between meals.     feeding supplement (PRO-STAT SUGAR FREE 64) Liqd  Take 30 mLs by mouth 2 (two) times daily at 10 am and 4 pm.     ferrous sulfate 325 (65 FE) MG  EC tablet  Take 325 mg by mouth daily with breakfast.     folic acid 1 MG tablet  Commonly known as:  FOLVITE  Take 1 tablet (1 mg total) by mouth daily.     insulin aspart 100 UNIT/ML injection  Commonly known as:  novoLOG  Inject 0-9 Units into the skin 3 (three) times daily with meals.     levalbuterol 0.63 MG/3ML nebulizer solution  Commonly known as:  XOPENEX  Take 3 mLs (0.63 mg total) by nebulization every 6 (six) hours as needed for wheezing or shortness of breath.     levofloxacin 500 MG tablet  Commonly known as:  LEVAQUIN  Take 1 tablet (500 mg total) by mouth daily.     loperamide 2 MG capsule  Commonly known as:  IMODIUM  Take 2 capsules (4 mg total) by mouth every 12 (twelve) hours as needed for diarrhea or loose stools.     LORazepam 0.5 MG tablet  Commonly known as:  ATIVAN  Take 1 tablet (0.5 mg total) by mouth 3 (three) times daily as needed for anxiety.     magnesium oxide 400 (241.3 MG) MG tablet  Commonly known as:  MAG-OX  Take 1 tablet (400 mg total) by mouth daily.     metoprolol 100 MG tablet  Commonly known as:  LOPRESSOR  Take 1 tablet (100 mg total) by mouth 2 (two) times daily.     multivitamin with minerals Tabs tablet  Take 1 tablet by mouth daily.     nicotine 14 mg/24hr patch  Commonly known as:  NICODERM CQ - dosed in mg/24 hours  Place 1 patch (14 mg total) onto the skin daily.     nitroGLYCERIN 0.4 MG SL tablet  Commonly known as:  NITROSTAT  Place 1 tablet (0.4 mg total) under the tongue every 5 (five) minutes x 3 doses as needed for chest pain.     pantoprazole 40 MG tablet  Commonly known as:  PROTONIX  Take 1 tablet (40 mg total) by mouth daily.     potassium chloride SA 20 MEQ tablet  Commonly known as:  K-DUR,KLOR-CON  Take 20 mEq by mouth daily.     predniSONE 20 MG tablet  Commonly known as:  DELTASONE  Take 2 tablets (40 mg total) by mouth daily with breakfast.     thiamine 100 MG tablet  Take 1 tablet (100 mg  total) by mouth daily.     torsemide 20 MG tablet  Commonly known as:  DEMADEX  Take 2 tablets (40 mg total) by mouth 2 (two) times daily.     warfarin 5 MG tablet  Commonly known as:  COUMADIN  Take 5-7.5 mg by mouth daily. Takes 7.5mg  on Mondays & 5mg  all other days of week        Discharged Condition:improved    Consults:cardiology/neurology  Significant Diagnostic Studies: Dg Chest 2 View  11/26/2013   CLINICAL DATA:  Short of breath.  EXAM: CHEST  2 VIEW  COMPARISON:  10/23/2013  FINDINGS: Cardiac silhouette is mildly enlarged. There are changes from cardiac valve replacement. Mediastinum is normal in contour. No hilar masses.  There is hazy opacity in coarse reticular opacity in the lung bases. There is central interstitial thickening. No pleural effusion. No pneumothorax.  IMPRESSION: 1. Cardiomegaly, changes from cardiac valve replacement, in hazy lung base opacity and mild interstitial thickening suggest mild congestive heart failure. Lung base infiltrate should be considered if this patient has symptoms of pneumonia.   Electronically Signed   By: Lajean Manes M.D.   On: 11/26/2013 19:05   Ct Head Wo Contrast  11/26/2013   CLINICAL DATA:  Altered mental status, pain  EXAM: CT HEAD WITHOUT CONTRAST  TECHNIQUE: Contiguous axial images were obtained from the base of the skull through the vertex without intravenous contrast.  COMPARISON:  None.  FINDINGS: No skull fracture is noted. Paranasal sinuses and mastoid air cells are unremarkable. No intracranial hemorrhage, mass effect or midline shift. Mild cerebral atrophy. No acute infarction. No mass lesion is noted on this unenhanced scan. No hydrocephalus.  IMPRESSION: No acute intracranial abnormality. No definite acute cortical infarction. Mild cerebral atrophy.   Electronically Signed   By: Lahoma Crocker M.D.   On: 11/26/2013 18:52   US Venous Img Lower Unilateral Left  11/07/2013   CLINICAL DATA:  Left leg swelling  EXAM: Left LOWER  EXTREMITY VENOUS DOPPLER ULTRASOUND  TECHNIQUE: Gray-scale sonography with graded compression, as well as color Doppler and duplex ultrasound were performed to evaluate the lower extremity deep venous systems from the level of the common femoral vein and including the common femoral, femoral, profunda femoral, popliteal and calf veins including the posterior tibial, peroneal and gastrocnemius veins when visible. The superficial great saphenous vein was also interrogated. Spectral Doppler was utilized to evaluate flow at rest  and with distal augmentation maneuvers in the common femoral, femoral and popliteal veins.  COMPARISON:  None.  FINDINGS: Common Femoral Vein: No evidence of thrombus. Normal compressibility, respiratory phasicity and response to augmentation.  Saphenofemoral Junction: No evidence of thrombus. Normal compressibility and flow on color Doppler imaging.  Profunda Femoral Vein: No evidence of thrombus. Normal compressibility and flow on color Doppler imaging.  Femoral Vein: No evidence of thrombus. Normal compressibility, respiratory phasicity and response to augmentation.  Popliteal Vein: No evidence of thrombus. Normal compressibility, respiratory phasicity and response to augmentation.  Calf Veins: No evidence of thrombus. Normal compressibility and flow on color Doppler imaging.  Superficial Great Saphenous Vein: No evidence of thrombus. Normal compressibility and flow on color Doppler imaging.  Venous Reflux:  None.  Other Findings:  Edema is noted within the calf.  IMPRESSION: No evidence of deep venous thrombosis.   Electronically Signed   By: Alcide Clever M.D.   On: 11/07/2013 15:36   Dg Chest Port 1 View  12/04/2013   CLINICAL DATA:  Respiratory failure.  Recent extubation  EXAM: PORTABLE CHEST - 1 VIEW  COMPARISON:  12/03/2013  FINDINGS: Prior median sternotomy and valve replacement/CABG. Cardiomegaly. Bilateral lower lobe atelectasis or infiltrate slightly improved since prior study.  Small effusions. Diffuse interstitial prominence could reflect interstitial edema, slightly improved since prior study.  IMPRESSION: Improving interstitial edema pattern and bibasilar atelectasis or infiltrates.  Small bilateral effusions.   Electronically Signed   By: Charlett Nose M.D.   On: 12/04/2013 09:23   Dg Chest Port 1 View  12/03/2013   CLINICAL DATA:  Respiratory failure  EXAM: PORTABLE CHEST - 1 VIEW  COMPARISON:  12/02/2013, 11/26/2013  FINDINGS: Endotracheal tube with the tip 4.7 cm above the carina. Nasogastric tube coursing below the diaphragm excluded from the field of view.  Bilateral small pleural effusions. Bilateral diffuse interstitial thickening. Bibasilar hazy airspace disease likely reflecting atelectasis. No pneumothorax. Stable cardiomegaly. Prior CABG. Thoracic aortic atherosclerosis.  Unremarkable osseous structures.  IMPRESSION: 1. Endotracheal tube with the tip 4.7 cm above the carina. 2. Stable cardiomegaly. Bilateral small pleural effusions and bibasilar airspace disease concerning for CHF. Developing bibasilar infiltrates cannot be excluded.   Electronically Signed   By: Elige Ko   On: 12/03/2013 07:29   Dg Chest Port 1 View  12/02/2013   CLINICAL DATA:  Followup chest radiograph  EXAM: PORTABLE CHEST - 1 VIEW  COMPARISON:  12/01/2013; 11/30/2013; 11/29/2013  FINDINGS: Grossly unchanged enlarged cardiac silhouette and mediastinal contours post median sternotomy and valve replacement. Stable positioning of support apparatus. The pulmonary vasculature remains indistinct with cephalization of flow. Grossly unchanged layering small to moderate sized bilateral effusions with associated bilateral mid and lower lung heterogeneous/consolidative opacities, left greater than right. No new focal airspace opacities. No pneumothorax. Unchanged bones.  IMPRESSION: 1. Stable positioning of support apparatus.  No pneumothorax. 2. Grossly unchanged findings of edema with layering small to  moderate sized bilateral effusions and associated bibasilar opacities, atelectasis versus infiltrate.   Electronically Signed   By: Simonne Come M.D.   On: 12/02/2013 08:26   Dg Chest Port 1 View  12/01/2013   CLINICAL DATA:  Respiratory failure  EXAM: PORTABLE CHEST - 1 VIEW  COMPARISON:  Yesterday  FINDINGS: Endotracheal tube and NG tube are stable. Pulmonary edema and bilateral pleural effusions are stable. No pneumothorax. Moderate cardiomegaly.  IMPRESSION: Stable CHF.   Electronically Signed   By: Maryclare Bean M.D.   On: 12/01/2013 08:03  Dg Chest Port 1 View  11/30/2013   CLINICAL DATA:  Respiratory failure.  EXAM: PORTABLE CHEST - 1 VIEW  COMPARISON:  11/29/2013, 11/28/2013, and 11/26/2013  FINDINGS: Endotracheal tube tip is 4 cm above the carina in good position. NG tube tip is below the diaphragm. Bilateral pulmonary edema has improved. There are moderate bilateral pleural effusions which are essentially unchanged. Persistent cardiomegaly and pulmonary vascular congestion.  IMPRESSION: Improving congestive heart failure.   Electronically Signed   By: Rozetta Nunnery M.D.   On: 11/30/2013 09:49   Portable Chest Xray In Am  11/29/2013   CLINICAL DATA:  Respiratory failure.  EXAM: PORTABLE CHEST - 1 VIEW  COMPARISON:  11/28/2013  FINDINGS: Endotracheal tube remains in place with tip approximately 5 cm above the carina. Enteric tube courses into the left upper abdomen with tip not imaged. 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 The cardiac silhouette remains enlarged. Pulmonary edema does not appear significantly changed. Right larger than left pleural effusions are likely unchanged. Increased parenchymal opacity in lung bases is unchanged likely reflects atelectasis. No pneumothorax is identified.  IMPRESSION: Pulmonary edema and right larger than left pleural effusions without significant interval change.   Electronically Signed   By: Logan Bores   On: 11/29/2013 08:21   Portable Chest  Xray  11/28/2013   CLINICAL DATA:  Endotracheal tube placement.  EXAM: PORTABLE CHEST - 1 VIEW  COMPARISON:  Two-view chest 11/26/2013.  FINDINGS: The heart is enlarged. 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 The tip of the endotracheal tube is 4.8 cm above the carina, within normal limits. An NG tube courses off the inferior border of the film. Moderate bilateral edema is now present. Bilateral pleural effusions are present, right greater than left. Bibasilar airspace disease likely reflects atelectasis. Infection is not excluded.  IMPRESSION: 1. Congestive heart failure with bilateral edema, pleural effusions, and airspace disease, right greater than left. 2. The airspace disease likely reflects atelectasis. Infection is not excluded. 3. Satisfactory positioning of the endotracheal tube 4.8 cm above the carina.   Electronically Signed   By: Lawrence Santiago M.D.   On: 11/28/2013 09:00    Lab Results: Basic Metabolic Panel:  Recent Labs  12/04/13 0440 12/06/13 0622  NA 142 139  K 4.0 3.9  CL 95* 91*  CO2 38* 40*  GLUCOSE 144* 81  BUN 61* 67*  CREATININE 1.14 1.32  CALCIUM 9.1 9.3   Liver Function Tests: No results found for this basename: AST, ALT, ALKPHOS, BILITOT, PROT, ALBUMIN,  in the last 72 hours   CBC:  Recent Labs  12/04/13 0440 12/06/13 0622  WBC 15.0* 14.3*  NEUTROABS 14.5* 12.6*  HGB 10.3* 10.9*  HCT 34.5* 35.3*  MCV 94.8 93.1  PLT 278 301    Recent Results (from the past 240 hour(s))  MRSA PCR SCREENING     Status: None   Collection Time    11/26/13  9:40 PM      Result Value Ref Range Status   MRSA by PCR NEGATIVE  NEGATIVE Final   Comment:            The GeneXpert MRSA Assay (FDA     approved for NASAL specimens     only), is one component of a     comprehensive MRSA colonization     surveillance program. It is not     intended to diagnose MRSA     infection nor to  guide or     monitor treatment for     MRSA  infections.  CLOSTRIDIUM DIFFICILE BY PCR     Status: None   Collection Time    12/04/13  3:45 AM      Result Value Ref Range Status   C difficile by pcr NEGATIVE  NEGATIVE Final     Hospital Course: he was admitted with seizure like activity that was thought  To be related to hypoxia. He was admitted and had no further episodes but began having more shortness of breath and developed acute pulmonary edema.  He eventually required intubation and mechanical ventilation for about 5 days. He was diuresed  And was able to be extubated. He had PT evaluation and was felt to need rehab. He was at his baseline as far as his heart is concerned at discharge but was still in atrial fib.  Discharge Exam: Blood pressure 166/75, pulse 68, temperature 97.5 F (36.4 C), temperature source Oral, resp. rate 20, height 5\' 7"  (1.702 m), weight 66.588 kg (146 lb 12.8 oz), SpO2 96.00%. He is awake and alert and in no distress. He is in atrial fib. Chest is clear.  Disposition: to snf for rehab. He will be on no added salt diet, have PT,OT, and speech evaluations if needed. He will have PT/INR tomorrow and bmet tomorrow and then further blood work per attending at rehab center. Prednisone will be tapered,40 mg for 2 days,30mg  for 2 days, 20mg  for 2 days 10mg  for 2 days then stop. facility protocol sliding scale.      Discharge Instructions   Discharge to SNF when bed available    Complete by:  As directed              Signed: Alonza Bogus   12/06/2013, 11:24 AM

## 2013-12-06 NOTE — Clinical Social Work Note (Signed)
Pt d/c today to SNF. Bed offer accepted at [facility]. Facility notified and will arrange transport via [place] offered to call pt's family and he refuses, stating he will later. D/C summary faxed.  [name], [place]

## 2013-12-06 NOTE — Progress Notes (Signed)
Patient discharged to [facility] called,and given to [facility] LPN, vital signs stable.No c/o pain or discomfort noted.Accompanied by staff to an awaiting vehicle.

## 2013-12-06 NOTE — Progress Notes (Signed)
The patient was seen and examined, and I agree with the assessment and plan as documented above, with modifications as noted below. HR controlled on current dose of diltiazem. GFR 64-->54 from 6/1-->6/3. BUN 67, had been in 30's in late May. Will reduce torsemide to 40 mg q am and 20 mg q pm. Awaiting rehab facility for discharge.

## 2013-12-06 NOTE — Progress Notes (Signed)
Subjective: He says he feels well. He has no complaints. He is not having any chest pain. He is not short of breath.  Objective: Vital signs in last 24 hours: Temp:  [97.5 F (36.4 C)-98.4 F (36.9 C)] 97.5 F (36.4 C) (06/03 0445) Pulse Rate:  [68-87] 68 (06/03 0445) Resp:  [14-21] 20 (06/03 0445) BP: (125-166)/(51-75) 166/75 mmHg (06/03 0445) SpO2:  [92 %-96 %] 96 % (06/03 0445) Weight:  [66.588 kg (146 lb 12.8 oz)] 66.588 kg (146 lb 12.8 oz) (06/03 0445) Weight change: -4.412 kg (-9 lb 11.6 oz) Last BM Date: 12/04/13  Intake/Output from previous day: 06/02 0701 - 06/03 0700 In: 240 [P.O.:240] Out: 2550 [Urine:2550]  PHYSICAL EXAM General appearance: alert, cooperative and no distress Resp: clear to auscultation bilaterally Cardio: His heart is still somewhat irregular. He has prosthetic aortic and mitral valve sounds. GI: soft, non-tender; bowel sounds normal; no masses,  no organomegaly Extremities: venous stasis dermatitis noted  Lab Results:    Basic Metabolic Panel:  Recent Labs  12/04/13 0440 12/06/13 0622  NA 142 139  K 4.0 3.9  CL 95* 91*  CO2 38* 40*  GLUCOSE 144* 81  BUN 61* 67*  CREATININE 1.14 1.32  CALCIUM 9.1 9.3   Liver Function Tests: No results found for this basename: AST, ALT, ALKPHOS, BILITOT, PROT, ALBUMIN,  in the last 72 hours No results found for this basename: LIPASE, AMYLASE,  in the last 72 hours No results found for this basename: AMMONIA,  in the last 72 hours CBC:  Recent Labs  12/04/13 0440 12/06/13 0622  WBC 15.0* 14.3*  NEUTROABS 14.5* 12.6*  HGB 10.3* 10.9*  HCT 34.5* 35.3*  MCV 94.8 93.1  PLT 278 301   Cardiac Enzymes: No results found for this basename: CKTOTAL, CKMB, CKMBINDEX, TROPONINI,  in the last 72 hours BNP: No results found for this basename: PROBNP,  in the last 72 hours D-Dimer: No results found for this basename: DDIMER,  in the last 72 hours CBG:  Recent Labs  12/05/13 1136 12/05/13 1720  12/05/13 2020 12/05/13 2314 12/06/13 0508 12/06/13 0717  GLUCAP 166* 99 155* 145* 99 80   Hemoglobin A1C: No results found for this basename: HGBA1C,  in the last 72 hours Fasting Lipid Panel:  Recent Labs  12/04/13 0440  TRIG 77   Thyroid Function Tests: No results found for this basename: TSH, T4TOTAL, FREET4, T3FREE, THYROIDAB,  in the last 72 hours Anemia Panel: No results found for this basename: VITAMINB12, FOLATE, FERRITIN, TIBC, IRON, RETICCTPCT,  in the last 72 hours Coagulation:  Recent Labs  12/05/13 0532 12/06/13 0622  LABPROT 23.2* 28.2*  INR 2.14* 2.76*   Urine Drug Screen: Drugs of Abuse  No results found for this basename: labopia, cocainscrnur, labbenz, amphetmu, thcu, labbarb    Alcohol Level: No results found for this basename: ETH,  in the last 72 hours Urinalysis: No results found for this basename: COLORURINE, APPERANCEUR, LABSPEC, PHURINE, GLUCOSEU, HGBUR, BILIRUBINUR, KETONESUR, PROTEINUR, UROBILINOGEN, NITRITE, LEUKOCYTESUR,  in the last 72 hours Misc. Labs:  ABGS  Recent Labs  12/05/13 0500  PHART 7.455*  PO2ART 70.9*  TCO2 33.5  HCO3 36.8*   CULTURES Recent Results (from the past 240 hour(s))  MRSA PCR SCREENING     Status: None   Collection Time    11/26/13  9:40 PM      Result Value Ref Range Status   MRSA by PCR NEGATIVE  NEGATIVE Final   Comment:  The GeneXpert MRSA Assay (FDA     approved for NASAL specimens     only), is one component of a     comprehensive MRSA colonization     surveillance program. It is not     intended to diagnose MRSA     infection nor to guide or     monitor treatment for     MRSA infections.  CLOSTRIDIUM DIFFICILE BY PCR     Status: None   Collection Time    12/04/13  3:45 AM      Result Value Ref Range Status   C difficile by pcr NEGATIVE  NEGATIVE Final   Studies/Results: Dg Chest Port 1 View  12/04/2013   CLINICAL DATA:  Respiratory failure.  Recent extubation  EXAM: PORTABLE  CHEST - 1 VIEW  COMPARISON:  12/03/2013  FINDINGS: Prior median sternotomy and valve replacement/CABG. Cardiomegaly. Bilateral lower lobe atelectasis or infiltrate slightly improved since prior study. Small effusions. Diffuse interstitial prominence could reflect interstitial edema, slightly improved since prior study.  IMPRESSION: Improving interstitial edema pattern and bibasilar atelectasis or infiltrates.  Small bilateral effusions.   Electronically Signed   By: Rolm Baptise M.D.   On: 12/04/2013 09:23    Medications:  Prior to Admission:  Prescriptions prior to admission  Medication Sig Dispense Refill  . amLODipine (NORVASC) 10 MG tablet Take 10 mg by mouth at bedtime.      Marland Kitchen atorvastatin (LIPITOR) 40 MG tablet Take 40 mg by mouth daily.      . clopidogrel (PLAVIX) 75 MG tablet Take 1 tablet (75 mg total) by mouth daily with breakfast.  30 tablet  6  . ferrous sulfate 325 (65 FE) MG EC tablet Take 325 mg by mouth daily with breakfast.      . folic acid (FOLVITE) 1 MG tablet Take 1 tablet (1 mg total) by mouth daily.  30 tablet  4  . levalbuterol (XOPENEX) 0.63 MG/3ML nebulizer solution Take 3 mLs (0.63 mg total) by nebulization every 6 (six) hours as needed for wheezing or shortness of breath.  3 mL  4  . magnesium oxide (MAG-OX) 400 (241.3 MG) MG tablet Take 1 tablet (400 mg total) by mouth daily.  30 tablet  6  . metoprolol (LOPRESSOR) 100 MG tablet Take 1 tablet (100 mg total) by mouth 2 (two) times daily.  60 tablet  6  . Multiple Vitamin (MULTIVITAMIN WITH MINERALS) TABS Take 1 tablet by mouth daily.      . pantoprazole (PROTONIX) 40 MG tablet Take 1 tablet (40 mg total) by mouth daily.  30 tablet  0  . potassium chloride SA (K-DUR,KLOR-CON) 20 MEQ tablet Take 20 mEq by mouth daily.      Marland Kitchen thiamine 100 MG tablet Take 1 tablet (100 mg total) by mouth daily.  30 tablet  4  . torsemide (DEMADEX) 20 MG tablet Take 2 tablets (40 mg total) by mouth 2 (two) times daily.  60 tablet  6  .  warfarin (COUMADIN) 5 MG tablet Take 5-7.5 mg by mouth daily. Takes 7.5mg  on Mondays & 5mg  all other days of week      . nitroGLYCERIN (NITROSTAT) 0.4 MG SL tablet Place 1 tablet (0.4 mg total) under the tongue every 5 (five) minutes x 3 doses as needed for chest pain.  25 tablet  3   Scheduled: . atorvastatin  20 mg Oral q1800  . diltiazem  90 mg Oral 4 times per day  . feeding supplement (ENSURE  COMPLETE)  237 mL Oral BID BM  . feeding supplement (PRO-STAT SUGAR FREE 64)  30 mL Oral BID  . insulin aspart  0-9 Units Subcutaneous TID WC  . ipratropium-albuterol  3 mL Nebulization TID  . levofloxacin  500 mg Oral Daily  . metoprolol tartrate  50 mg Per Tube BID  . nicotine  14 mg Transdermal Daily  . pantoprazole  40 mg Oral Daily  . predniSONE  40 mg Oral Q breakfast  . sodium chloride  3 mL Intravenous Q12H  . thiamine  100 mg Oral Daily   Or  . thiamine  100 mg Intravenous Daily  . torsemide  40 mg Oral BID  . Warfarin - Pharmacist Dosing Inpatient   Does not apply Q24H   Continuous:  KZL:DJTTSV chloride, acetaminophen, albuterol, fentaNYL, loperamide, LORazepam, sodium chloride  Assesment: He was admitted with some sort of jerking activity of his arms but seems to of been related to hypoxia. He had been using a oxygen tank that was empty. Once he was admitted he developed increasing problems with acute on chronic diastolic congestive heart failure and developed pulmonary edema and acute respiratory failure requiring intubation and mechanical ventilation. He also has severe COPD which contributed to his respiratory failure. He was eventually able to be weaned off the ventilator he has diuresed well. He has acute on chronic kidney disease which is somewhat improved. He had previously had atrial fibrillation and was cardioverted and went back into atrial fibrillation with rapid ventricular response during the acute respiratory failure and ventilator care. His heart rate is well controlled  but he is still in atrial fibrillation. He has agreed to rehabilitation placement which I think is appropriate Principal Problem:   Seizure-like activity Active Problems:   ANEMIA   TOBACCO ABUSE   CHRONIC OBSTRUCTIVE PULMONARY DISEASE   MITRAL VALVE REPLACEMENT, HX OF   AORTIC VALVE REPLACEMENT, HX OF   Chronic anticoagulation   Coronary atherosclerosis of unspecified type of vessel, native or graft   Acute on chronic diastolic CHF (congestive heart failure), NYHA class 4   Hyponatremia   Atrial fibrillation status post cardioversion   Alcohol abuse   Atrial fibrillation with rapid ventricular response   Hypokalemia   Acute-on-chronic respiratory failure    Plan: He will be transferred to rehabilitation facility when bed is available.    LOS: 10 days   Alonza Bogus 12/06/2013, 8:41 AM

## 2013-12-06 NOTE — Plan of Care (Signed)
Problem: Phase III Progression Outcomes Goal: Transfer/discharge plan in place Outcome: Completed/Met Date Met:  12/06/13             Patient discharged to [facility] facility.

## 2013-12-06 NOTE — Progress Notes (Signed)
ANTICOAGULATION CONSULT NOTE  Pharmacy Consult for Coumadin  Indication: Afib + St Jude's AVR/MVR   No Known Allergies  Patient Measurements: Height: 5\' 7"  (170.2 cm) Weight: 146 lb 12.8 oz (66.588 kg) IBW/kg (Calculated) : 66.1  Vital Signs: Temp: 97.5 F (36.4 C) (06/03 0445) Temp src: Oral (06/03 0445) BP: 166/75 mmHg (06/03 0445) Pulse Rate: 68 (06/03 0445)  Labs:  Recent Labs  12/04/13 0440 12/05/13 0532 12/06/13 0622  HGB 10.3*  --  10.9*  HCT 34.5*  --  35.3*  PLT 278  --  301  LABPROT 21.2* 23.2* 28.2*  INR 1.90* 2.14* 2.76*  CREATININE 1.14  --  1.32   Estimated Creatinine Clearance: 50.1 ml/min (by C-G formula based on Cr of 1.32).  Medical History: Past Medical History  Diagnosis Date  . Essential hypertension, benign   . Coronary atherosclerosis of native coronary artery     a. CABG/MVR/AVR-2003 (#25 St. Jude/#21 St. Jude); anticoagulation; b. negative stress nuclear-2005;  c. 01/2013 NSTEMI/Cath/PCI: LM nl, LAD 50p, 40-49m, D1 50ost, LCX 66m, RCA 50/91m (2.5x20 Promus DES), 50d, VG->Diag 100, VG->PDA 100, VG->OM 100, LIMA->LAD ok.  . Epistaxis     Requiring cautery & aterial ligation-09/2009  . GERD (gastroesophageal reflux disease)   . Hyperlipemia   . Abnormal LFTs     Possible cirrhosis  . Chronic anticoagulation   . Valvular heart disease     a. 2003: MVR/AVR-2003 (#25 St. Jude/#21 St. Jude);  b. 01/2013 Echo: EF 55%, Mech AVR mean grad 12, Mech MVR mean grad 6.  . Tobacco abuse     45 pack years  . Fasting hyperglycemia   . Nephrolithiasis   . Diverticulosis   . Hyponatremia   . Chronic diastolic CHF (congestive heart failure)   . Chronic renal disease   . Hemolytic anemia   . ETOH abuse   . Atrial fibrillation 01/16/2013    On coumadin DCCV 07/2013.   Marland Kitchen COPD (chronic obstructive pulmonary disease)    Medications:  Prescriptions prior to admission  Medication Sig Dispense Refill  . amLODipine (NORVASC) 10 MG tablet Take 10 mg by mouth  at bedtime.      Marland Kitchen atorvastatin (LIPITOR) 40 MG tablet Take 40 mg by mouth daily.      . clopidogrel (PLAVIX) 75 MG tablet Take 1 tablet (75 mg total) by mouth daily with breakfast.  30 tablet  6  . ferrous sulfate 325 (65 FE) MG EC tablet Take 325 mg by mouth daily with breakfast.      . folic acid (FOLVITE) 1 MG tablet Take 1 tablet (1 mg total) by mouth daily.  30 tablet  4  . levalbuterol (XOPENEX) 0.63 MG/3ML nebulizer solution Take 3 mLs (0.63 mg total) by nebulization every 6 (six) hours as needed for wheezing or shortness of breath.  3 mL  4  . magnesium oxide (MAG-OX) 400 (241.3 MG) MG tablet Take 1 tablet (400 mg total) by mouth daily.  30 tablet  6  . metoprolol (LOPRESSOR) 100 MG tablet Take 1 tablet (100 mg total) by mouth 2 (two) times daily.  60 tablet  6  . Multiple Vitamin (MULTIVITAMIN WITH MINERALS) TABS Take 1 tablet by mouth daily.      . pantoprazole (PROTONIX) 40 MG tablet Take 1 tablet (40 mg total) by mouth daily.  30 tablet  0  . potassium chloride SA (K-DUR,KLOR-CON) 20 MEQ tablet Take 20 mEq by mouth daily.      Marland Kitchen thiamine 100 MG  tablet Take 1 tablet (100 mg total) by mouth daily.  30 tablet  4  . torsemide (DEMADEX) 20 MG tablet Take 2 tablets (40 mg total) by mouth 2 (two) times daily.  60 tablet  6  . warfarin (COUMADIN) 5 MG tablet Take 5-7.5 mg by mouth daily. Takes 7.5mg  on Mondays & 5mg  all other days of week      . nitroGLYCERIN (NITROSTAT) 0.4 MG SL tablet Place 1 tablet (0.4 mg total) under the tongue every 5 (five) minutes x 3 doses as needed for chest pain.  25 tablet  3   Assessment: 68yo male on chronic Coumadin PTA for h/o mitral and aortic valve replacement + AFib.  Home dose (listed above).     INR was therapeutic on admission, but trended above goal range & dose held x 2 days, INR dropped to subtherapeutic but is now at goal after dosing adjustments.  Pt is also on Levaquin which can interact with warfarin to increase INR. Tube feedings were changed to  diet + Ensure supplements.  HF exacerbation has improved. No bleeding noted.    Goal of Therapy:  INR 2.5 - 3.5    Plan:   Coumadin 5mg  po x1 today (per home dosing regimen)  INR daily  Alquan Morrish A Kyan Yurkovich 12/06/2013,10:28 AM

## 2013-12-06 NOTE — Progress Notes (Signed)
Present with patient to discuss advance directives. Gave patient the information packet. He stated he wanted to review it. Gave him contact information if he needed any further assistance.

## 2013-12-06 NOTE — Clinical Social Work Placement (Signed)
Clinical Social Work Department CLINICAL SOCIAL WORK PLACEMENT NOTE 12/06/2013  Patient:  [name], [name]  Account Number:  [id number] Admit date:  11/26/2013  Clinical Social Worker:  [name], LCSW  Date/time:  12/05/2013 09:22 AM  Clinical Social Work is seeking post-discharge placement for this patient at the following level of care:   SKILLED NURSING   (*CSW will update this form in Epic as items are completed)   12/05/2013  Patient/family provided with [place] [facility] Department of Clinical Social Work's list of facilities offering this level of care within the geographic area requested by the patient (or if unable, by the patient's family).  12/05/2013  Patient/family informed of their freedom to choose among providers that offer the needed level of care, that participate in Medicare, Medicaid or managed care program needed by the patient, have an available bed and are willing to accept the patient.  12/05/2013  Patient/family informed of [facility]' ownership interest in [facility], as well as of the fact that they are under no obligation to receive care at this facility.  PASARR submitted to [employer] on 12/04/2013 PASARR number received from [employer] on 12/04/2013  FL2 transmitted to all facilities in geographic area requested by pt/family on  12/05/2013 FL2 transmitted to all facilities within larger geographic area on   Patient informed that his/her managed care company has contracts with or will negotiate with  certain facilities, including the following:     Patient/family informed of bed offers received:  12/06/2013 Patient chooses bed at [facility] Physician recommends and patient chooses bed at  [facility]  Patient to be transferred to [facility] on  12/06/2013 Patient to be transferred to facility by [facility]  The following physician request were entered in Epic:   Additional Comments:  [name], [place]

## 2013-12-15 ENCOUNTER — Other Ambulatory Visit: Payer: Self-pay | Admitting: Adult Health

## 2013-12-15 ENCOUNTER — Other Ambulatory Visit: Payer: Self-pay | Admitting: Cardiology

## 2013-12-18 ENCOUNTER — Telehealth: Payer: Self-pay | Admitting: Cardiology

## 2013-12-18 ENCOUNTER — Ambulatory Visit (INDEPENDENT_AMBULATORY_CARE_PROVIDER_SITE_OTHER): Payer: Medicare Other | Admitting: *Deleted

## 2013-12-18 DIAGNOSIS — Z9889 Other specified postprocedural states: Secondary | ICD-10-CM

## 2013-12-18 DIAGNOSIS — Z5181 Encounter for therapeutic drug level monitoring: Secondary | ICD-10-CM

## 2013-12-18 DIAGNOSIS — Z954 Presence of other heart-valve replacement: Secondary | ICD-10-CM

## 2013-12-18 LAB — POCT INR: INR: 2.6

## 2013-12-18 MED ORDER — POTASSIUM CHLORIDE CRYS ER 20 MEQ PO TBCR: 20.0000 meq | EXTENDED_RELEASE_TABLET | Freq: Every day | ORAL | Status: AC

## 2013-12-18 NOTE — Telephone Encounter (Signed)
Received fax refill request  Rx # [id number] Medication:  Klor-Con M20 tab Qty 30 Sig:  Take one tablet by mouth once daily Physician:  Harl Bowie

## 2013-12-18 NOTE — Telephone Encounter (Signed)
Refill complete 

## 2014-01-01 ENCOUNTER — Ambulatory Visit (INDEPENDENT_AMBULATORY_CARE_PROVIDER_SITE_OTHER): Payer: Medicare Other | Admitting: *Deleted

## 2014-01-01 DIAGNOSIS — Z5181 Encounter for therapeutic drug level monitoring: Secondary | ICD-10-CM

## 2014-01-01 DIAGNOSIS — Z9889 Other specified postprocedural states: Secondary | ICD-10-CM

## 2014-01-01 DIAGNOSIS — Z954 Presence of other heart-valve replacement: Secondary | ICD-10-CM

## 2014-01-01 LAB — POCT INR: INR: 4.5

## 2014-01-04 ENCOUNTER — Other Ambulatory Visit: Payer: Self-pay | Admitting: Adult Health

## 2014-01-17 ENCOUNTER — Ambulatory Visit (INDEPENDENT_AMBULATORY_CARE_PROVIDER_SITE_OTHER): Payer: Medicare Other | Admitting: *Deleted

## 2014-01-17 DIAGNOSIS — Z954 Presence of other heart-valve replacement: Secondary | ICD-10-CM

## 2014-01-17 DIAGNOSIS — Z5181 Encounter for therapeutic drug level monitoring: Secondary | ICD-10-CM

## 2014-01-17 DIAGNOSIS — Z9889 Other specified postprocedural states: Secondary | ICD-10-CM

## 2014-01-17 LAB — POCT INR: INR: 7.5

## 2014-01-19 ENCOUNTER — Ambulatory Visit (INDEPENDENT_AMBULATORY_CARE_PROVIDER_SITE_OTHER): Payer: Medicare Other | Admitting: *Deleted

## 2014-01-19 DIAGNOSIS — Z9889 Other specified postprocedural states: Secondary | ICD-10-CM

## 2014-01-19 DIAGNOSIS — Z5181 Encounter for therapeutic drug level monitoring: Secondary | ICD-10-CM

## 2014-01-19 DIAGNOSIS — Z954 Presence of other heart-valve replacement: Secondary | ICD-10-CM

## 2014-01-19 LAB — POCT INR: INR: 4.5

## 2014-01-24 ENCOUNTER — Ambulatory Visit (INDEPENDENT_AMBULATORY_CARE_PROVIDER_SITE_OTHER): Payer: Medicare Other | Admitting: *Deleted

## 2014-01-24 DIAGNOSIS — Z5181 Encounter for therapeutic drug level monitoring: Secondary | ICD-10-CM

## 2014-01-24 DIAGNOSIS — Z9889 Other specified postprocedural states: Secondary | ICD-10-CM

## 2014-01-24 DIAGNOSIS — Z954 Presence of other heart-valve replacement: Secondary | ICD-10-CM

## 2014-01-24 LAB — POCT INR: INR: 1.8

## 2014-01-26 ENCOUNTER — Encounter: Payer: Self-pay | Admitting: Cardiology

## 2014-01-26 ENCOUNTER — Ambulatory Visit (INDEPENDENT_AMBULATORY_CARE_PROVIDER_SITE_OTHER): Payer: Medicare Other | Admitting: Cardiology

## 2014-01-26 VITALS — BP 153/68 | HR 76 | Ht 66.0 in | Wt 160.0 lb

## 2014-01-26 DIAGNOSIS — I4891 Unspecified atrial fibrillation: Secondary | ICD-10-CM

## 2014-01-26 DIAGNOSIS — I5033 Acute on chronic diastolic (congestive) heart failure: Secondary | ICD-10-CM

## 2014-01-26 DIAGNOSIS — I2510 Atherosclerotic heart disease of native coronary artery without angina pectoris: Secondary | ICD-10-CM

## 2014-01-26 DIAGNOSIS — Z952 Presence of prosthetic heart valve: Secondary | ICD-10-CM

## 2014-01-26 DIAGNOSIS — I509 Heart failure, unspecified: Secondary | ICD-10-CM

## 2014-01-26 DIAGNOSIS — Z954 Presence of other heart-valve replacement: Secondary | ICD-10-CM

## 2014-01-26 MED ORDER — TORSEMIDE 20 MG PO TABS: 60.0000 mg | ORAL_TABLET | Freq: Two times a day (BID) | ORAL | Status: DC

## 2014-01-26 MED ORDER — METOLAZONE 2.5 MG PO TABS: ORAL_TABLET | ORAL | Status: AC

## 2014-01-26 NOTE — Progress Notes (Signed)
Clinical Summary [name] is a 68 y.o.male seen today for follow up of the following medical problems.   1. Diastolic heart failure   - recent admit 12/2013 with pulmonary edema, respiratory failure requiring intubation. Was diuresed. Discharge wt 146 lbs - since discharge breathing has been stable per his report - weighs himself daily. Reports approx 11 lbs weight gain since discharge. Increased LE edema. + orthopnea. Compliant with torsemide 40mg  bid. Limiting salt intake.    2. CAD  - history of prior CABG 2003  - cath 01/2013 in setting of NSTEMI: LM patent, LAD prox 50% and 50% mid, D1 50%, LCX 30% mid, RCA 50% mid with more distal 99% lesion. SVG to diag occluded, SVG to PDA occluded, SVG to OM occluded, LIMA to LAD patent. The RCA received a DES  - compliant with plavix, has not been on ASA b/c also on coumadin to lower bleeding risk  - no recent chest pain   3. Valvular heart disease  - St Jude MVR and AVR, on chronic coumadin  - echo 07/2013 with normal function of both prosthetic valves per report  - compliant with coumadin   4. Afib  - denies any recent palpitations, he never feels palpitations - on coumadin, denies any bleeding on coumadin  - recent discharge Rx's included dilt 90mg  every 6 hrs, he has not filled the prescription, was not sure he needed to take it.   5. Hyperlipidemia   - compliant with statin     Past Medical History  Diagnosis Date  . Essential hypertension, benign   . Coronary atherosclerosis of native coronary artery     a. CABG/MVR/AVR-2003 (#25 St. Jude/#21 St. Jude); anticoagulation; b. negative stress nuclear-2005;  c. 01/2013 NSTEMI/Cath/PCI: LM nl, LAD 50p, 40-36m, D1 50ost, LCX 47m, RCA 50/42m (2.5x20 Promus DES), 50d, VG->Diag 100, VG->PDA 100, VG->OM 100, LIMA->LAD ok.  . Epistaxis     Requiring cautery & aterial ligation-09/2009  . GERD (gastroesophageal reflux disease)   . Hyperlipemia   . Abnormal LFTs     Possible cirrhosis    . Chronic anticoagulation   . Valvular heart disease     a. 2003: MVR/AVR-2003 (#25 St. Jude/#21 St. Jude);  b. 01/2013 Echo: EF 55%, Mech AVR mean grad 12, Mech MVR mean grad 6.  . Tobacco abuse     45 pack years  . Fasting hyperglycemia   . Nephrolithiasis   . Diverticulosis   . Hyponatremia   . Chronic diastolic CHF (congestive heart failure)   . Chronic renal disease   . Hemolytic anemia   . ETOH abuse   . Atrial fibrillation 01/16/2013    On coumadin DCCV 07/2013.   Marland Kitchen COPD (chronic obstructive pulmonary disease)      No Known Allergies   Current Outpatient Prescriptions  Medication Sig Dispense Refill  . acetaminophen (TYLENOL) 650 MG suppository Place 1 suppository (650 mg total) rectally every 6 (six) hours as needed for fever or mild pain.  12 suppository  0  . albuterol (PROVENTIL) (2.5 MG/3ML) 0.083% nebulizer solution Take 3 mLs (2.5 mg total) by nebulization every 2 (two) hours as needed for wheezing.  75 mL  12  . Amino Acids-Protein Hydrolys (FEEDING SUPPLEMENT, PRO-STAT SUGAR FREE 64,) LIQD Take 30 mLs by mouth 2 (two) times daily at 10 am and 4 pm.  900 mL  0  . atorvastatin (LIPITOR) 40 MG tablet Take 40 mg by mouth daily.      Marland Kitchen  atorvastatin (LIPITOR) 40 MG tablet TAKE ONE TABLET BY MOUTH ONCE DAILY **THIS REPLACES ZOCOR (SIMVASTATIN)**  30 tablet  9  . clopidogrel (PLAVIX) 75 MG tablet Take 1 tablet (75 mg total) by mouth daily with breakfast.  30 tablet  6  . diltiazem (CARDIZEM) 90 MG tablet Take 1 tablet (90 mg total) by mouth every 6 (six) hours.      . feeding supplement, ENSURE COMPLETE, (ENSURE COMPLETE) LIQD Take 237 mLs by mouth 2 (two) times daily between meals.      . ferrous sulfate 325 (65 FE) MG EC tablet Take 325 mg by mouth daily with breakfast.      . folic acid (FOLVITE) 1 MG tablet Take 1 tablet (1 mg total) by mouth daily.  30 tablet  4  . insulin aspart (NOVOLOG) 100 UNIT/ML injection Inject 0-9 Units into the skin 3 (three) times daily with  meals.  10 mL  11  . levalbuterol (XOPENEX) 0.63 MG/3ML nebulizer solution Take 3 mLs (0.63 mg total) by nebulization every 6 (six) hours as needed for wheezing or shortness of breath.  3 mL  4  . loperamide (IMODIUM) 2 MG capsule Take 2 capsules (4 mg total) by mouth every 12 (twelve) hours as needed for diarrhea or loose stools.  30 capsule  0  . LORazepam (ATIVAN) 0.5 MG tablet Take 1 tablet (0.5 mg total) by mouth 3 (three) times daily as needed for anxiety.  30 tablet  0  . magnesium oxide (MAG-OX) 400 (241.3 MG) MG tablet Take 1 tablet (400 mg total) by mouth daily.  30 tablet  6  . metoprolol (LOPRESSOR) 100 MG tablet Take 1 tablet (100 mg total) by mouth 2 (two) times daily.  60 tablet  6  . Multiple Vitamin (MULTIVITAMIN WITH MINERALS) TABS Take 1 tablet by mouth daily.      . nicotine (NICODERM CQ - DOSED IN MG/24 HOURS) 14 mg/24hr patch Place 1 patch (14 mg total) onto the skin daily.  28 patch  0  . nitroGLYCERIN (NITROSTAT) 0.4 MG SL tablet Place 1 tablet (0.4 mg total) under the tongue every 5 (five) minutes x 3 doses as needed for chest pain.  25 tablet  3  . pantoprazole (PROTONIX) 40 MG tablet TAKE ONE TABLET BY MOUTH ONCE DAILY  30 tablet  3  . potassium chloride SA (K-DUR,KLOR-CON) 20 MEQ tablet Take 1 tablet (20 mEq total) by mouth daily.  90 tablet  3  . thiamine 100 MG tablet Take 1 tablet (100 mg total) by mouth daily.  30 tablet  4  . torsemide (DEMADEX) 20 MG tablet Take 2 tablets (40 mg total) by mouth 2 (two) times daily.  60 tablet  6  . warfarin (COUMADIN) 5 MG tablet Take 5-7.5 mg by mouth daily. Takes 7.5mg  on Mondays & 5mg  all other days of week       No current facility-administered medications for this visit.     Past Surgical History  Procedure Laterality Date  . Endocopic sphenopalatine artery ligation & cautry    . Inguinal hernia repair      Left & right  . Coronary artery bypass graft  11/2001    [facility]  . Cardiac valve replacement  11/2001      AVR and MVR-St. Jude devices  . Cataract extraction w/phaco  01/20/2011    Procedure: CATARACT EXTRACTION PHACO AND INTRAOCULAR LENS PLACEMENT (IOC);  Surgeon: Elta Guadeloupe T. Gershon Crane;  Location: [facility];  Service: Ophthalmology;  Laterality: Right;  CDE: 8.51  . Cataract extraction w/phaco  02/03/2011    Procedure: CATARACT EXTRACTION PHACO AND INTRAOCULAR LENS PLACEMENT (IOC);  Surgeon: Elta Guadeloupe T. Gershon Crane;  Location: [facility];  Service: Ophthalmology;  Laterality: Left;  CDE:10.01  . Colonoscopy  04/05/02; 08/2011    friable anal canal hemorrhoids otherwise normal; 2 diminutive polyps excised, minimal diverticulosis noted  . Esophagogastroduodenoscopy  01/2002    Dr. Laural Golden, submucosal esophageal lesion c/w leiomyoma  . Colonoscopy  09/02/2011    Procedure: COLONOSCOPY;  Surgeon: Daneil Dolin, MD;  Location: [facility];  Service: Endoscopy;  Laterality: N/A;  8:15  . Yag laser application Right 46/27/0350    Procedure: YAG LASER APPLICATION;  Surgeon: Elta Guadeloupe T. Gershon Crane, MD;  Location: [facility];  Service: Ophthalmology;  Laterality: Right;     No Known Allergies    Family History  Problem Relation Age of Onset  . Hypotension Neg Hx   . Anesthesia problems Neg Hx   . Malignant hyperthermia Neg Hx   . Pseudochol deficiency Neg Hx   . Colon cancer Neg Hx   . Liver disease Neg Hx      Social History Mr. Zechman reports that he has been smoking Cigarettes.  He has a 13 pack-year smoking history. He uses smokeless tobacco. Mr. Staffa reports that he drinks about 4.2 ounces of alcohol per week.   Review of Systems CONSTITUTIONAL: No weight loss, fever, chills, weakness or fatigue.  HEENT: Eyes: No visual loss, blurred vision, double vision or yellow sclerae.No hearing loss, sneezing, congestion, runny nose or sore throat.  SKIN: No rash or itching.  CARDIOVASCULAR: per HPI RESPIRATORY:chronic SOB.  GASTROINTESTINAL: No anorexia, nausea, vomiting or diarrhea. No abdominal pain or blood.   GENITOURINARY: No burning on urination, no polyuria NEUROLOGICAL: No headache, dizziness, syncope, paralysis, ataxia, numbness or tingling in the extremities. No change in bowel or bladder control.  MUSCULOSKELETAL: No muscle, back pain, joint pain or stiffness.  LYMPHATICS: No enlarged nodes. No history of splenectomy.  PSYCHIATRIC: No history of depression or anxiety.  ENDOCRINOLOGIC: No reports of sweating, cold or heat intolerance. No polyuria or polydipsia.  Marland Kitchen   Physical Examination p 76 bp 150/70 Wt 160 lbs BMI 26 Gen: resting comfortably, no acute distress HEENT: no scleral icterus, pupils equal round and reactive, no palptable cervical adenopathy,  CV: irreg, mechniacl S1, S2, no m/r/g. JVD to angle of jaw Resp: Clear to auscultation bilaterally GI: abdomen is soft, non-tender, non-distended, normal bowel sounds, no hepatosplenomegaly MSK: extremities are warm, 1+ bilaterl edema Skin: warm, no rash Neuro:  no focal deficits Psych: appropriate affect   Diagnostic Studies 07/2013 Echo  LVEF 50-55%, normal AVR, normal MVR,  01/2013 Cath  Hemodynamics:  AO: 145/59 mmHg  Coronary angiography:  Coronary dominance:  Left Main: Short in with no significant disease.  Left Anterior Descending (LAD): Normal in size with moderate calcifications proximally. There is 50% disease proximally just before the diagonal [name]. The vessel is very tortuous in the midsegment with diffuse 40-50% disease. There is competitive flow noted distally from a patent LIMA.  1st diagonal (D1): Large in size with 50% ostial and proximal disease. This vessel is large and supplies most of the anterolateral wall.  2nd diagonal (D2): Very small in size.  3rd diagonal (D3): Very small in size.  Circumflex (LCx): Normal in size and nondominant. The vessel is mildly calcified. There is 30 % tubular disease in the midsegment.  1st obtuse marginal: Very small in size  with minor irregularities.  2nd obtuse  marginal: Small in size with 60% ostial disease.  3rd obtuse marginal: Normal in size with 80% proximal disease. The vessel is very tortuous in that segment and distally.  AV groove continuation segment: Normal in size with minor irregularities.  Right Coronary Artery: Normal in size and dominant. The vessel is moderately calcified throughout its course. There is diffuse 50% disease in the midsegment. There is another lesion of 99% in the midsegment just before giving a large [name] [name]. There is 50% disease distally.  Posterior descending artery: Normal in size with minor irregularities.  Posterior AV segment: Small in size. SVG to diagonal: Occluded proximally. I suspect that this is the culprit for non-ST elevation MI due to dye stain noted.  SVG to right PDA: Occluded proximally.  SVG to OM: Is occluded proximally.  LIMA to LAD: Patent with no significant disease.  Left ventriculography: Was not performed.  PCI Procedure Note: Following the diagnostic procedure, the decision was made to proceed with PCI. I reviewed the images with Dr. Burt Knack. The sheath was upsized to a 6 [place]. Weight-based bivalirudin was given for anticoagulation. Once a therapeutic ACT was achieved, a 6 [place] JR 4 guide catheter was inserted. A run through coronary guidewire was used to cross the lesion. The lesion was predilated with a 2.5 x 12 mm balloon. After crossing the lesion, there was no flow in the distal RCA likely due to severity of stenosis. There was sluggish flow or send [name]. Thus, I used another run through a wire and placed in the [name] [name] for protection. I then tried to advance a 2.5 x 20 mm Promus drug-eluting stent into the distal RCA but was not successful due to tortuosity and calcifications. This was in spite of having to I asked. I decided to predilate the lesion with a 2.5 noncompliant balloon which was done with multiple inflations. I was then able to advance the stent after deep seating the  guide. The stent was deployed at 12 atmospheres. I then removed the wire from the [name] [name] and post dilated the stent with a 2.5 noncompliant balloon to 16 atmospheres. Following PCI, there was 0% residual stenosis and TIMI-3 flow. Final angiography confirmed an excellent result. There was still moderate disease in the mid RCA proximal to the stent which improved after removing the wires likely due to wire bias. This was left to be treated medically. Femoral hemostasis was achieved with a Mynx device. The patient tolerated the PCI procedure well. There were no immediate procedural complications. The patient was transferred to the post catheterization recovery area for further monitoring.  PCI Data:  Vessel - mid RCA/Segment - 2  Percent Stenosis (pre) 99%  TIMI-flow 3  Stent 2.5 x 20 mm Promus drug-eluting stent  Percent Stenosis (post) 0%  TIMI-flow (post) 3  Final Conclusions:  1. Significant three-vessel coronary artery disease with occluded vein grafts. Patent LIMA to LAD. The culprit for non-ST elevation myocardial infarction is likely occluded SVG to diagonal. However, the native diagonal is large and does not seem to have obstructive disease. There is significant disease in proximal OM 3. However, the vessel is tortuous and calcified. Severe 99% stenosis in mid RCA at the bifurcation with a large [name].  2. Successful angioplasty and drug-eluting stent placement to the mid RCA.  Recommendations:  I recommend treatment with Plavix and warfarin alone without aspirin to minimize risk of bleeding. I will resume heparin 8 hours from now  and resume warfarin as well. Obtain an echocardiogram to evaluate LV systolic function and the function of mechanical valves. Recommend rate control for atrial fibrillation. I increased the dose of metoprolol.  Recommend medical therapy for that disease in OM3 which is tortuous and calcified  08/17/13  Nuclear stress test  Tomographic views were obtained using  the short axis, vertical long axis, and horizontal long axis planes. There is a moderate-sized, mild to moderate intensity, reversible defect in the lateral wall that is consistent with ischemia. There is also a small, mild intensity, partially reversible inferior wall defect that is consistent with scar and associated mild peri-infarct ischemia.  Gated imaging reveals an EDV of 147, ESV of 90, TID ratio 0.99, and LVEF calculated at 39% with inferoposterior hypokinesis.  IMPRESSION: Intermediate risk Lexiscan Cardiolite. Nondiagnostic ST segment changes were noted, rare PVCs without sustained arrhythmia. Sinus rhythm was present throughout. Perfusion imaging is consistent with ischemia within the lateral wall as outlined, combination of scar and mild peri-infarct ischemia in the inferior wall. LVEF 39% with inferoposterior hypokinesis and overall normal LV volume. This is consistent with ischemic heart disease and associated cardiomyopathy.      Assessment and Plan    1. Acute on chronic diastolic heart failure  - multiple admissions over last few months with volume overload  - weight increasing with increased edema, will give metolazone 2.5mg  prn q week with dose this week, increase torsemide to 60mg  bid - f/u early next week with repeat BMET and Mg  2. CAD  - no recent chest pain  - recent MPI intermediate risk some areas of ischemia in the lateral wall and inferior wall.  - continue medical management  - continue plavix, not on ASA b/c on coumadin as well.   3. Valvular heart disease  - prior MVR and AVR  - recent echo with normal functioning prosthetic valves  - continue coumadin.   4. Afib  - no current symptoms  - rate contorl is unclear at this time, as he is asymptomatic even with high heart rates. During recent admit afib with RVR, started on oral dilt 90mg  q6 hrs which patient has not started as outpatient - unclear if he needs more aggressive rate control or  not, could be reason for reccurent CHF exacerbations - will order 24 hr holter monitor to follow rates, add back oral dilt pending results  5. Hyperlipidemia  - at goal, continue current statin.      Arnoldo Lenis, M.D., F.A.C.C.

## 2014-01-26 NOTE — Patient Instructions (Signed)
Your physician recommends that you schedule a follow-up appointment in: Next week with Bunnie Domino, NP  Your physician recommends that you return for lab work. BMET, MAG. Please get blood work the day before you see Belenda Cruise.  Your physician has recommended you make the following change in your medication:   Metolazone 2.5 mg take 1 tablet every week as needed for swelling  Increased Torsemide 60 mg twice a day.  Your physician has recommended that you wear a holter monitor. Holter monitors are medical devices that record the heart's electrical activity. Doctors most often use these monitors to diagnose arrhythmias. Arrhythmias are problems with the speed or rhythm of the heartbeat. The monitor is a small, portable device. You can wear one while you do your normal daily activities. This is usually used to diagnose what is causing palpitations/syncope (passing out). 24 hour  DO NOT TAKE DILTIAZEM.

## 2014-01-29 ENCOUNTER — Ambulatory Visit (HOSPITAL_COMMUNITY)
Admission: RE | Admit: 2014-01-29 | Discharge: 2014-01-29 | Disposition: A | Discharge status: Home / Self Care | Payer: Medicare Other | Source: Ambulatory Visit | Attending: Cardiology | Admitting: Cardiology

## 2014-01-29 ENCOUNTER — Ambulatory Visit (INDEPENDENT_AMBULATORY_CARE_PROVIDER_SITE_OTHER): Payer: Medicare Other | Admitting: *Deleted

## 2014-01-29 DIAGNOSIS — I4891 Unspecified atrial fibrillation: Secondary | ICD-10-CM | POA: Insufficient documentation

## 2014-01-29 DIAGNOSIS — I2510 Atherosclerotic heart disease of native coronary artery without angina pectoris: Secondary | ICD-10-CM | POA: Diagnosis not present

## 2014-01-29 DIAGNOSIS — Z951 Presence of aortocoronary bypass graft: Secondary | ICD-10-CM | POA: Diagnosis not present

## 2014-01-29 DIAGNOSIS — Z954 Presence of other heart-valve replacement: Secondary | ICD-10-CM

## 2014-01-29 DIAGNOSIS — I252 Old myocardial infarction: Secondary | ICD-10-CM | POA: Diagnosis not present

## 2014-01-29 DIAGNOSIS — Z9981 Dependence on supplemental oxygen: Secondary | ICD-10-CM | POA: Diagnosis not present

## 2014-01-29 DIAGNOSIS — Z9889 Other specified postprocedural states: Secondary | ICD-10-CM

## 2014-01-29 DIAGNOSIS — I5033 Acute on chronic diastolic (congestive) heart failure: Secondary | ICD-10-CM | POA: Insufficient documentation

## 2014-01-29 DIAGNOSIS — I509 Heart failure, unspecified: Secondary | ICD-10-CM | POA: Insufficient documentation

## 2014-01-29 DIAGNOSIS — Z7901 Long term (current) use of anticoagulants: Secondary | ICD-10-CM | POA: Insufficient documentation

## 2014-01-29 DIAGNOSIS — Z5181 Encounter for therapeutic drug level monitoring: Secondary | ICD-10-CM

## 2014-01-29 LAB — POCT INR: INR: 2.2

## 2014-01-29 NOTE — Progress Notes (Signed)
24 hour Holter Monitor in progress. 

## 2014-01-30 ENCOUNTER — Ambulatory Visit (INDEPENDENT_AMBULATORY_CARE_PROVIDER_SITE_OTHER): Payer: Medicare Other | Admitting: Adult Health

## 2014-01-30 ENCOUNTER — Encounter: Payer: Self-pay | Admitting: Adult Health

## 2014-01-30 VITALS — BP 118/58 | HR 65 | Ht 66.0 in | Wt 159.0 lb

## 2014-01-30 DIAGNOSIS — I509 Heart failure, unspecified: Secondary | ICD-10-CM

## 2014-01-30 DIAGNOSIS — Z954 Presence of other heart-valve replacement: Secondary | ICD-10-CM

## 2014-01-30 DIAGNOSIS — I5033 Acute on chronic diastolic (congestive) heart failure: Secondary | ICD-10-CM

## 2014-01-30 DIAGNOSIS — I4891 Unspecified atrial fibrillation: Secondary | ICD-10-CM

## 2014-01-30 DIAGNOSIS — J449 Chronic obstructive pulmonary disease, unspecified: Secondary | ICD-10-CM

## 2014-01-30 LAB — BASIC METABOLIC PANEL
BUN: 37 mg/dL — AB (ref 6–23)
CHLORIDE: 89 meq/L — AB (ref 96–112)
CO2: 31 meq/L (ref 19–32)
Calcium: 8.9 mg/dL (ref 8.4–10.5)
Creat: 1.47 mg/dL — ABNORMAL HIGH (ref 0.50–1.35)
Glucose, Bld: 98 mg/dL (ref 70–99)
Potassium: 5 mEq/L (ref 3.5–5.3)
Sodium: 131 mEq/L — ABNORMAL LOW (ref 135–145)

## 2014-01-30 LAB — MAGNESIUM: Magnesium: 2 mg/dL (ref 1.5–2.5)

## 2014-01-30 NOTE — Patient Instructions (Signed)
Your physician recommends that you schedule a follow-up appointment in: 1 month    Your physician recommends that you continue on your current medications as directed. Please refer to the Current Medication list given to you today.    Please have Lattie Haw weigh you at Coumadin apt this week     Thank you for choosing [facility] !

## 2014-01-30 NOTE — Assessment & Plan Note (Signed)
Remains oxygen dependent.

## 2014-01-30 NOTE — Assessment & Plan Note (Signed)
He remains in atrial fibrillation. Holter monitor read by Dr. Domenic Polite on 01/30/2014 revealing atrial fibrillation heart rate between 43 beats per minute at 109 beats per minute. PVCs and intermittent junctional beats were noted. No pauses. Average heart rate was 71 beats per minute.  I will not restart diltiazem at this time. As average heart rate is well-controlled. Highest heart rate around 109 beats per minute although this is transient. The patient will be followed in our Coumadin telemetry given one week. I have asked him to weigh him and check vital signs prior to that visit. His weight is up for his heart rate is up may need to see him that day. For now we will not start diltiazem to continue Coumadin.

## 2014-01-30 NOTE — Assessment & Plan Note (Signed)
Remains on Coumadin therapy. Echo in January 2015 reveals normal function of valve

## 2014-01-30 NOTE — Progress Notes (Signed)
HPI: [name] is a 68 year old patient of Dr. Harl Bowie we  following for ongoing assessment and management of diastolic CHF, recent admission in June of 2015 with pulmonary edema, dry weight 146 pounds. CAD with prior history of CABG, non-ST elevation MI in July 2014, with cardiac catheterization at that time requiring drug-eluting stent to the distal right coronary artery. The patient was placed on Plavix but not aspirin due to use of Coumadin, atrial fibrillation on Coumadin, valvular heart disease status post St. Jude MVR and aVR with echocardiogram in January of 2015 with normally functioning prosthetic valve.  The last office 01/26/2014 the patient was found to have fluid overload, with increasing weight and edema, he was given metolazone 2.5 mg when necessary once a week with dose given that day and increase her torsemide to 60 mg twice a day. He had a followup BMET in diltiazem 90 mg every 6 hours with patient had not been taking at the time of the office visit.. A Holter monitor was recommended to determine need to take diltiazem.  Holter monitor was read by Dr. Domenic Polite on 01/30/2014 prior to the patient office visit. This revealed atrial fibrillation with heart rates between 43 and 109 beats per minute, PVCs and intermittent junctional beats. No pauses were noted. Average heart rate was 71 beats per minute. The patient has just recently taken metolazone, waiting 5 days before taking it. He continues to struggle with sodium intake. He states he is feeling about the same, no better no worse. He thinks the swelling is all better.    No Known Allergies  Current Outpatient Prescriptions  Medication Sig Dispense Refill  . acetaminophen (TYLENOL) 650 MG suppository Place 1 suppository (650 mg total) rectally every 6 (six) hours as needed for fever or mild pain.  12 suppository  0  . albuterol (PROVENTIL) (2.5 MG/3ML) 0.083% nebulizer solution Take 3 mLs (2.5 mg total) by nebulization every 2  (two) hours as needed for wheezing.  75 mL  12  . Amino Acids-Protein Hydrolys (FEEDING SUPPLEMENT, PRO-STAT SUGAR FREE 64,) LIQD Take 30 mLs by mouth 2 (two) times daily at 10 am and 4 pm.  900 mL  0  . amLODipine (NORVASC) 10 MG tablet       . atorvastatin (LIPITOR) 40 MG tablet TAKE ONE TABLET BY MOUTH ONCE DAILY **THIS REPLACES ZOCOR (SIMVASTATIN)**  30 tablet  9  . clopidogrel (PLAVIX) 75 MG tablet Take 1 tablet (75 mg total) by mouth daily with breakfast.  30 tablet  6  . ferrous sulfate 325 (65 FE) MG EC tablet Take 325 mg by mouth daily with breakfast.      . folic acid (FOLVITE) 1 MG tablet Take 1 tablet (1 mg total) by mouth daily.  30 tablet  4  . insulin aspart (NOVOLOG) 100 UNIT/ML injection Inject 0-9 Units into the skin 3 (three) times daily with meals.  10 mL  11  . levalbuterol (XOPENEX) 0.63 MG/3ML nebulizer solution Take 3 mLs (0.63 mg total) by nebulization every 6 (six) hours as needed for wheezing or shortness of breath.  3 mL  4  . loperamide (IMODIUM) 2 MG capsule Take 2 capsules (4 mg total) by mouth every 12 (twelve) hours as needed for diarrhea or loose stools.  30 capsule  0  . magnesium oxide (MAG-OX) 400 (241.3 MG) MG tablet Take 1 tablet (400 mg total) by mouth daily.  30 tablet  6  . metolazone (ZAROXOLYN) 2.5 MG tablet Take 2.5  mg (1 tablet) every week as needed for swelling.  12 tablet  3  . metoprolol (LOPRESSOR) 100 MG tablet Take 1 tablet (100 mg total) by mouth 2 (two) times daily.  60 tablet  6  . Multiple Vitamin (MULTIVITAMIN WITH MINERALS) TABS Take 1 tablet by mouth daily.      . nitroGLYCERIN (NITROSTAT) 0.4 MG SL tablet Place 1 tablet (0.4 mg total) under the tongue every 5 (five) minutes x 3 doses as needed for chest pain.  25 tablet  3  . pantoprazole (PROTONIX) 40 MG tablet TAKE ONE TABLET BY MOUTH ONCE DAILY  30 tablet  3  . potassium chloride SA (K-DUR,KLOR-CON) 20 MEQ tablet Take 1 tablet (20 mEq total) by mouth daily.  90 tablet  3  . thiamine 100  MG tablet Take 1 tablet (100 mg total) by mouth daily.  30 tablet  4  . torsemide (DEMADEX) 20 MG tablet Take 3 tablets (60 mg total) by mouth 2 (two) times daily.  180 tablet  6  . warfarin (COUMADIN) 5 MG tablet Take 5-7.5 mg by mouth daily. Takes 7.5mg  on Mondays & 5mg  all other days of week      . feeding supplement, ENSURE COMPLETE, (ENSURE COMPLETE) LIQD Take 237 mLs by mouth 2 (two) times daily between meals.       No current facility-administered medications for this visit.    Past Medical History  Diagnosis Date  . Essential hypertension, benign   . Coronary atherosclerosis of native coronary artery     a. CABG/MVR/AVR-2003 (#25 St. Jude/#21 St. Jude); anticoagulation; b. negative stress nuclear-2005;  c. 01/2013 NSTEMI/Cath/PCI: LM nl, LAD 50p, 40-76m, D1 50ost, LCX 29m, RCA 50/63m (2.5x20 Promus DES), 50d, VG->Diag 100, VG->PDA 100, VG->OM 100, LIMA->LAD ok.  . Epistaxis     Requiring cautery & aterial ligation-09/2009  . GERD (gastroesophageal reflux disease)   . Hyperlipemia   . Abnormal LFTs     Possible cirrhosis  . Chronic anticoagulation   . Valvular heart disease     a. 2003: MVR/AVR-2003 (#25 St. Jude/#21 St. Jude);  b. 01/2013 Echo: EF 55%, Mech AVR mean grad 12, Mech MVR mean grad 6.  . Tobacco abuse     45 pack years  . Fasting hyperglycemia   . Nephrolithiasis   . Diverticulosis   . Hyponatremia   . Chronic diastolic CHF (congestive heart failure)   . Chronic renal disease   . Hemolytic anemia   . ETOH abuse   . Atrial fibrillation 01/16/2013    On coumadin DCCV 07/2013.   Marland Kitchen COPD (chronic obstructive pulmonary disease)     Past Surgical History  Procedure Laterality Date  . Endocopic sphenopalatine artery ligation & cautry    . Inguinal hernia repair      Left & right  . Coronary artery bypass graft  11/2001    [facility]  . Cardiac valve replacement  11/2001    AVR and MVR-St. Jude devices  . Cataract extraction w/phaco  01/20/2011     Procedure: CATARACT EXTRACTION PHACO AND INTRAOCULAR LENS PLACEMENT (IOC);  Surgeon: Elta Guadeloupe T. Gershon Crane;  Location: [facility];  Service: Ophthalmology;  Laterality: Right;  CDE: 8.51  . Cataract extraction w/phaco  02/03/2011    Procedure: CATARACT EXTRACTION PHACO AND INTRAOCULAR LENS PLACEMENT (IOC);  Surgeon: Elta Guadeloupe T. Gershon Crane;  Location: [facility];  Service: Ophthalmology;  Laterality: Left;  CDE:10.01  . Colonoscopy  04/05/02; 08/2011    friable anal canal hemorrhoids otherwise normal;  2 diminutive polyps excised, minimal diverticulosis noted  . Esophagogastroduodenoscopy  01/2002    Dr. Laural Golden, submucosal esophageal lesion c/w leiomyoma  . Colonoscopy  09/02/2011    Procedure: COLONOSCOPY;  Surgeon: Daneil Dolin, MD;  Location: [facility];  Service: Endoscopy;  Laterality: N/A;  8:15  . Yag laser application Right 78/29/5621    Procedure: YAG LASER APPLICATION;  Surgeon: Elta Guadeloupe T. Gershon Crane, MD;  Location: [facility];  Service: Ophthalmology;  Laterality: Right;    ROS: Review of systems complete and found to be negative unless listed above  PHYSICAL EXAM BP 118/58  Pulse 65  Ht 5\' 6"  (1.676 m)  Wt 159 lb (72.122 kg)  BMI 25.68 kg/m2  SpO2 95% General: Well developed, well nourished, in no acute distress wearing oxygen. Head: Eyes PERRLA, No xanthomas.   Normal cephalic and atramatic  Lungs: Clear bilaterally to auscultation and percussion. Heart: HRIR S1 S2, without MRG.  Pulses are 2+ & equal.            No carotid bruit. No JVD.  No abdominal bruits. No femoral bruits. Abdomen: Bowel sounds are positive, abdomen mildly distended, although not severe. and non-tender without masses or                  Hernia's noted. Msk:  Back normal, normal gait. Normal strength and tone for age. Extremities: No clubbing, cyanosis or 1-2+ pitting edema on the right, 1+ pitting edema on the left, woody skin thickening, with reddish appearance.  DP diminished  Neuro: Alert and oriented X 3. Psych:  Good affect,  responds appropriately   ASSESSMENT AND PLAN

## 2014-01-30 NOTE — Progress Notes (Deleted)
Name: [name]    DOB: 1945/08/19  Age: 68 y.o.  MR#: [id number]       PCP:  Alonza Bogus, MD      Insurance: Payor: [employer] MEDICARE / Plan: AARP MEDICARE COMPLETE / Product Type: *No Product type* /   CC:    Chief Complaint  Patient presents with  . Congestive Heart Failure  . Coronary Artery Disease    VS Filed Vitals:   01/30/14 1604  BP: 118/58  Pulse: 65  Height: 5\' 6"  (1.676 m)  Weight: 159 lb (72.122 kg)  SpO2: 95%    Weights Current Weight  01/30/14 159 lb (72.122 kg)  01/26/14 160 lb (72.576 kg)  12/06/13 146 lb 12.8 oz (66.588 kg)    Blood Pressure  BP Readings from Last 3 Encounters:  01/30/14 118/58  01/26/14 153/68  12/06/13 137/59     Admit date:  (Not on file) Last encounter with RMR:  01/04/2014   Allergy Review of patient's allergies indicates no known allergies.  Current Outpatient Prescriptions  Medication Sig Dispense Refill  . acetaminophen (TYLENOL) 650 MG suppository Place 1 suppository (650 mg total) rectally every 6 (six) hours as needed for fever or mild pain.  12 suppository  0  . albuterol (PROVENTIL) (2.5 MG/3ML) 0.083% nebulizer solution Take 3 mLs (2.5 mg total) by nebulization every 2 (two) hours as needed for wheezing.  75 mL  12  . Amino Acids-Protein Hydrolys (FEEDING SUPPLEMENT, PRO-STAT SUGAR FREE 64,) LIQD Take 30 mLs by mouth 2 (two) times daily at 10 am and 4 pm.  900 mL  0  . amLODipine (NORVASC) 10 MG tablet       . atorvastatin (LIPITOR) 40 MG tablet TAKE ONE TABLET BY MOUTH ONCE DAILY **THIS REPLACES ZOCOR (SIMVASTATIN)**  30 tablet  9  . clopidogrel (PLAVIX) 75 MG tablet Take 1 tablet (75 mg total) by mouth daily with breakfast.  30 tablet  6  . ferrous sulfate 325 (65 FE) MG EC tablet Take 325 mg by mouth daily with breakfast.      . folic acid (FOLVITE) 1 MG tablet Take 1 tablet (1 mg total) by mouth daily.  30 tablet  4  . insulin aspart (NOVOLOG) 100 UNIT/ML injection Inject 0-9 Units into the skin 3  (three) times daily with meals.  10 mL  11  . levalbuterol (XOPENEX) 0.63 MG/3ML nebulizer solution Take 3 mLs (0.63 mg total) by nebulization every 6 (six) hours as needed for wheezing or shortness of breath.  3 mL  4  . loperamide (IMODIUM) 2 MG capsule Take 2 capsules (4 mg total) by mouth every 12 (twelve) hours as needed for diarrhea or loose stools.  30 capsule  0  . magnesium oxide (MAG-OX) 400 (241.3 MG) MG tablet Take 1 tablet (400 mg total) by mouth daily.  30 tablet  6  . metolazone (ZAROXOLYN) 2.5 MG tablet Take 2.5 mg (1 tablet) every week as needed for swelling.  12 tablet  3  . metoprolol (LOPRESSOR) 100 MG tablet Take 1 tablet (100 mg total) by mouth 2 (two) times daily.  60 tablet  6  . Multiple Vitamin (MULTIVITAMIN WITH MINERALS) TABS Take 1 tablet by mouth daily.      . nitroGLYCERIN (NITROSTAT) 0.4 MG SL tablet Place 1 tablet (0.4 mg total) under the tongue every 5 (five) minutes x 3 doses as needed for chest pain.  25 tablet  3  . pantoprazole (PROTONIX) 40 MG tablet TAKE  ONE TABLET BY MOUTH ONCE DAILY  30 tablet  3  . potassium chloride SA (K-DUR,KLOR-CON) 20 MEQ tablet Take 1 tablet (20 mEq total) by mouth daily.  90 tablet  3  . thiamine 100 MG tablet Take 1 tablet (100 mg total) by mouth daily.  30 tablet  4  . torsemide (DEMADEX) 20 MG tablet Take 3 tablets (60 mg total) by mouth 2 (two) times daily.  180 tablet  6  . warfarin (COUMADIN) 5 MG tablet Take 5-7.5 mg by mouth daily. Takes 7.5mg  on Mondays & 5mg  all other days of week      . feeding supplement, ENSURE COMPLETE, (ENSURE COMPLETE) LIQD Take 237 mLs by mouth 2 (two) times daily between meals.       No current facility-administered medications for this visit.    Discontinued Meds:    Medications Discontinued During This Encounter  Medication Reason  . LORazepam (ATIVAN) 0.5 MG tablet Error  . nicotine (NICODERM CQ - DOSED IN MG/24 HOURS) 14 mg/24hr patch Error    Patient Active Problem List   Diagnosis  Date Noted  . Atrial fibrillation with rapid ventricular response 12/03/2013  . Hypokalemia 12/03/2013  . Acute-on-chronic respiratory failure 12/03/2013  . Seizure-like activity 11/26/2013  . Hyponatremia 11/26/2013  . Atrial fibrillation status post cardioversion 11/26/2013  . Alcohol abuse 11/26/2013  . Left knee pain 10/30/2013  . Encounter for therapeutic drug monitoring 08/03/2013  . Atrial fibrillation 07/30/2013  . Acute on chronic diastolic CHF (congestive heart failure), NYHA class 4 01/30/2013  . Acute respiratory failure with hypoxia 01/17/2013  . NSTEMI (non-ST elevated myocardial infarction) 01/16/2013  . Coronary atherosclerosis of unspecified type of vessel, native or graft   . Hypertension   . GERD (gastroesophageal reflux disease)   . Nephrolithiasis   . Chronic anticoagulation 12/22/2010  . HYPONATREMIA, CHRONIC 04/06/2010  . HYPERLIPIDEMIA 02/20/2010  . ANEMIA 02/20/2010  . TOBACCO ABUSE 02/20/2010  . CHRONIC OBSTRUCTIVE PULMONARY DISEASE 02/20/2010  . DIVERTICULOSIS, COLON 02/20/2010  . MITRAL VALVE REPLACEMENT, HX OF 02/20/2010  . AORTIC VALVE REPLACEMENT, HX OF 02/20/2010    LABS    Component Value Date/Time   NA 139 12/06/2013 0622   NA 142 12/04/2013 0440   NA 145 12/03/2013 0454   K 3.9 12/06/2013 0622   K 4.0 12/04/2013 0440   K 2.9* 12/03/2013 0454   CL 91* 12/06/2013 0622   CL 95* 12/04/2013 0440   CL 95* 12/03/2013 0454   CO2 40* 12/06/2013 0622   CO2 38* 12/04/2013 0440   CO2 39* 12/03/2013 0454   GLUCOSE 81 12/06/2013 0622   GLUCOSE 144* 12/04/2013 0440   GLUCOSE 189* 12/03/2013 0454   BUN 67* 12/06/2013 0622   BUN 61* 12/04/2013 0440   BUN 65* 12/03/2013 0454   CREATININE 1.32 12/06/2013 0622   CREATININE 1.14 12/04/2013 0440   CREATININE 1.29 12/03/2013 0454   CREATININE 1.18 08/09/2013 1009   CREATININE 1.39* 07/11/2013 1048   CREATININE 0.84 05/18/2011 0710   CALCIUM 9.3 12/06/2013 0622   CALCIUM 9.1 12/04/2013 0440   CALCIUM 8.9 12/03/2013 0454   GFRNONAA 54*  12/06/2013 0622   GFRNONAA 64* 12/04/2013 0440   GFRNONAA 55* 12/03/2013 0454   GFRAA 62* 12/06/2013 0622   GFRAA 74* 12/04/2013 0440   GFRAA 64* 12/03/2013 0454   CMP     Component Value Date/Time   NA 139 12/06/2013 0622   K 3.9 12/06/2013 0622   CL 91* 12/06/2013 0622   CO2 40*  12/06/2013 0622   GLUCOSE 81 12/06/2013 0622   BUN 67* 12/06/2013 0622   CREATININE 1.32 12/06/2013 0622   CREATININE 1.18 08/09/2013 1009   CALCIUM 9.3 12/06/2013 0622   PROT 6.2 12/03/2013 0454   ALBUMIN 3.1* 12/03/2013 0454   AST 29 12/03/2013 0454   ALT 16 12/03/2013 0454   ALKPHOS 69 12/03/2013 0454   BILITOT 0.6 12/03/2013 0454   GFRNONAA 54* 12/06/2013 0622   GFRAA 62* 12/06/2013 0622       Component Value Date/Time   WBC 14.3* 12/06/2013 0622   WBC 15.0* 12/04/2013 0440   WBC 7.2 12/03/2013 0454   HGB 10.9* 12/06/2013 0622   HGB 10.3* 12/04/2013 0440   HGB 10.5* 12/03/2013 0454   HCT 35.3* 12/06/2013 0622   HCT 34.5* 12/04/2013 0440   HCT 34.6* 12/03/2013 0454   MCV 93.1 12/06/2013 0622   MCV 94.8 12/04/2013 0440   MCV 95.1 12/03/2013 0454    Lipid Panel     Component Value Date/Time   CHOL 147 01/28/2013 0119   TRIG 77 12/04/2013 0440   HDL 64 01/28/2013 0119   CHOLHDL 2.3 01/28/2013 0119   VLDL 9 01/28/2013 0119   LDLCALC 74 01/28/2013 0119   LDLCALC 62 05/17/2008    ABG    Component Value Date/Time   PHART 7.455* 12/05/2013 0500   PCO2ART 53.1* 12/05/2013 0500   PO2ART 70.9* 12/05/2013 0500   HCO3 36.8* 12/05/2013 0500   TCO2 33.5 12/05/2013 0500   ACIDBASEDEF 2.0 07/23/2013 1450   O2SAT 93.6 12/05/2013 0500     Lab Results  Component Value Date   TSH 0.684 11/28/2013   BNP (last 3 results)  Recent Labs  07/23/13 1008 08/14/13 1637 11/26/13 1826  PROBNP 8081.0* 5977.0* 12192.0*   Cardiac Panel (last 3 results) No results found for this basename: CKTOTAL, CKMB, TROPONINI, RELINDX,  in the last 72 hours  Iron/TIBC/Ferritin/ %Sat    Component Value Date/Time   IRON 171* 07/24/2009 0552   TIBC 356 07/24/2009 0552   FERRITIN  224 07/24/2009 0552   IRONPCTSAT 48 07/24/2009 0552     EKG Orders placed during the hospital encounter of 01/29/14  . HOLTER MONITOR - 24 HOUR  . HOLTER MONITOR - 90 HOUR     Prior Assessment and Plan Problem List as of 01/30/2014     Cardiovascular and Mediastinum   Atrial fibrillation status post cardioversion   Coronary atherosclerosis of unspecified type of vessel, native or graft   Last Assessment & Plan   10/30/2013 Office Visit Written 10/30/2013  1:33 PM by Lendon Colonel, NP     No chest pain. Continue current medication regimen.    Hypertension   Last Assessment & Plan   10/30/2013 Office Visit Written 10/30/2013  1:34 PM by Lendon Colonel, NP     BP is stable. He is advised to avoid the salty foods Doubt he will change his this lifestyle but will continue to educate.    NSTEMI (non-ST elevated myocardial infarction)   Last Assessment & Plan   02/10/2013 Office Visit Written 02/10/2013  3:27 PM by Lendon Colonel, NP     He is without cardiac complaint. He is medically compliant. He will continue with risk factor management with close follow up. No changes in his medications regimen.    Acute on chronic diastolic CHF (congestive heart failure), NYHA class 4   Last Assessment & Plan   10/30/2013 Office Visit Written 10/30/2013  1:32 PM by Curt Bears  Brooks Sailors, NP     Doubt that his knee swelling is related to decompensated CHF. It is unilateral. I will however have him take an additional dose of torsemide today, 20 mg. CBC will be completed.    Atrial fibrillation   Last Assessment & Plan   08/03/2013 Office Visit Written 08/03/2013  5:30 PM by Lendon Colonel, NP     Heart rate is well-controlled, he continues on Coumadin, and has had dosing completed today in our [place] office. No other change in his medication regimen at this time.    Atrial fibrillation with rapid ventricular response     Respiratory   CHRONIC OBSTRUCTIVE PULMONARY DISEASE   Last Assessment  & Plan   03/19/2011 Office Visit Written 03/19/2011 12:48 PM by Yehuda Savannah, MD     Unfortunately, patient is not inclined to attempt to discontinue cigarette smoking.    Acute respiratory failure with hypoxia   Acute-on-chronic respiratory failure     Digestive   DIVERTICULOSIS, COLON   GERD (gastroesophageal reflux disease)     Genitourinary   Nephrolithiasis     Other   Alcohol abuse   HYPERLIPIDEMIA   Last Assessment & Plan   03/22/2012 Office Visit Written 03/22/2012  1:33 PM by Lendon Colonel, NP     I have advised him smoking cessation would help with cholesterol status. He will have followup labs completed by Dr. Luan Pulling next week. He verbalizes understanding.    HYPONATREMIA, CHRONIC   Last Assessment & Plan   03/19/2011 Office Visit Written 03/19/2011 12:52 PM by Yehuda Savannah, MD     Electrolytes will be reassessed.    ANEMIA   Last Assessment & Plan   08/10/2011 Office Visit Written 08/10/2011 12:32 PM by Mahala Menghini, PA     Chronic anemia. Heme neg X 3 in 05/2011, Dr. Lattie Haw. 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 Colonoscopy in near future.  I have discussed the risks, alternatives, benefits with regards to but not limited to the risk of reaction to medication, bleeding, infection, perforation and the patient is agreeable to proceed. Written consent to be obtained.     TOBACCO ABUSE   Last Assessment & Plan   03/30/2013 Office Visit Written 03/30/2013  3:52 PM by Lendon Colonel, NP     Unfortunately continues to smoke a minimum of one half pack of cigarettes a day. He is not inclined to quit. I reiterated the need to do so with his cardiovascular issues. He verbalizes understanding.    MITRAL VALVE REPLACEMENT, HX OF   AORTIC VALVE REPLACEMENT, HX OF   Last Assessment & Plan   02/10/2013 Office Visit Written 02/10/2013  3:32 PM by Lendon Colonel, NP     He continues on coumadin therapy. He will follow up in coumadin clinic for ongoing assessment of INR and  dosing.    Chronic anticoagulation   Last Assessment & Plan   07/03/2013 Office Visit Written 07/03/2013  3:06 PM by Lendon Colonel, NP     Seeing Edrick Oh RN today in coumadin clinic. She will manage his dosing.    Encounter for therapeutic drug monitoring   Left knee pain   Last Assessment & Plan   10/30/2013 Office Visit Written 10/30/2013  1:35 PM by Lendon Colonel, NP     I will have left knee X-rayed to evaluate for fluid accumulation and possible infection. CBC will be completed.    Seizure-like activity   Hyponatremia  Hypokalemia       Imaging: No results found.

## 2014-01-30 NOTE — Assessment & Plan Note (Signed)
Patient has only taken metolazone yesterday, waiting four days before filling medication.Marland Kitchen He continues to diuresis with his current dose of torsemide 60 mg twice a day. 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 As a new more mindful of this. The patient is only lost 1 pound. I will have him wait on next office visit prior to Coumadin clinic check. If he has gained weight, or has significant fluid retention, he will need to be seen in the office that day.

## 2014-02-02 ENCOUNTER — Encounter (HOSPITAL_COMMUNITY): Payer: Self-pay | Admitting: Emergency Medicine

## 2014-02-02 ENCOUNTER — Emergency Department (HOSPITAL_COMMUNITY)
Admission: EM | Admit: 2014-02-02 | Discharge: 2014-02-02 | Disposition: A | Discharge status: Home / Self Care | Payer: Medicare Other | Attending: Emergency Medicine | Admitting: Emergency Medicine

## 2014-02-02 DIAGNOSIS — I4891 Unspecified atrial fibrillation: Secondary | ICD-10-CM | POA: Insufficient documentation

## 2014-02-02 DIAGNOSIS — D599 Acquired hemolytic anemia, unspecified: Secondary | ICD-10-CM | POA: Diagnosis not present

## 2014-02-02 DIAGNOSIS — I2510 Atherosclerotic heart disease of native coronary artery without angina pectoris: Secondary | ICD-10-CM | POA: Insufficient documentation

## 2014-02-02 DIAGNOSIS — R040 Epistaxis: Secondary | ICD-10-CM | POA: Insufficient documentation

## 2014-02-02 DIAGNOSIS — E785 Hyperlipidemia, unspecified: Secondary | ICD-10-CM | POA: Diagnosis not present

## 2014-02-02 DIAGNOSIS — K219 Gastro-esophageal reflux disease without esophagitis: Secondary | ICD-10-CM | POA: Diagnosis not present

## 2014-02-02 DIAGNOSIS — Z7902 Long term (current) use of antithrombotics/antiplatelets: Secondary | ICD-10-CM | POA: Diagnosis not present

## 2014-02-02 DIAGNOSIS — I129 Hypertensive chronic kidney disease with stage 1 through stage 4 chronic kidney disease, or unspecified chronic kidney disease: Secondary | ICD-10-CM | POA: Insufficient documentation

## 2014-02-02 DIAGNOSIS — Z87442 Personal history of urinary calculi: Secondary | ICD-10-CM | POA: Insufficient documentation

## 2014-02-02 DIAGNOSIS — Z794 Long term (current) use of insulin: Secondary | ICD-10-CM | POA: Insufficient documentation

## 2014-02-02 DIAGNOSIS — J449 Chronic obstructive pulmonary disease, unspecified: Secondary | ICD-10-CM | POA: Insufficient documentation

## 2014-02-02 DIAGNOSIS — N189 Chronic kidney disease, unspecified: Secondary | ICD-10-CM | POA: Insufficient documentation

## 2014-02-02 DIAGNOSIS — Z7901 Long term (current) use of anticoagulants: Secondary | ICD-10-CM | POA: Insufficient documentation

## 2014-02-02 DIAGNOSIS — Z79899 Other long term (current) drug therapy: Secondary | ICD-10-CM | POA: Diagnosis not present

## 2014-02-02 DIAGNOSIS — I5032 Chronic diastolic (congestive) heart failure: Secondary | ICD-10-CM | POA: Insufficient documentation

## 2014-02-02 DIAGNOSIS — J4489 Other specified chronic obstructive pulmonary disease: Secondary | ICD-10-CM | POA: Insufficient documentation

## 2014-02-02 DIAGNOSIS — Z87891 Personal history of nicotine dependence: Secondary | ICD-10-CM | POA: Insufficient documentation

## 2014-02-02 LAB — CBC WITH DIFFERENTIAL/PLATELET
BASOS ABS: 0 10*3/uL (ref 0.0–0.1)
BASOS PCT: 0 % (ref 0–1)
EOS PCT: 3 % (ref 0–5)
Eosinophils Absolute: 0.2 10*3/uL (ref 0.0–0.7)
HCT: 32.2 % — ABNORMAL LOW (ref 39.0–52.0)
HEMOGLOBIN: 10.4 g/dL — AB (ref 13.0–17.0)
Lymphocytes Relative: 8 % — ABNORMAL LOW (ref 12–46)
Lymphs Abs: 0.6 10*3/uL — ABNORMAL LOW (ref 0.7–4.0)
MCH: 28.7 pg (ref 26.0–34.0)
MCHC: 32.3 g/dL (ref 30.0–36.0)
MCV: 88.7 fL (ref 78.0–100.0)
MONOS PCT: 9 % (ref 3–12)
Monocytes Absolute: 0.6 10*3/uL (ref 0.1–1.0)
Neutro Abs: 5.3 10*3/uL (ref 1.7–7.7)
Neutrophils Relative %: 80 % — ABNORMAL HIGH (ref 43–77)
Platelets: 289 10*3/uL (ref 150–400)
RBC: 3.63 MIL/uL — ABNORMAL LOW (ref 4.22–5.81)
RDW: 16.6 % — AB (ref 11.5–15.5)
WBC: 6.7 10*3/uL (ref 4.0–10.5)

## 2014-02-02 LAB — PROTIME-INR
INR: 2.93 — ABNORMAL HIGH (ref 0.00–1.49)
PROTHROMBIN TIME: 30.6 s — AB (ref 11.6–15.2)

## 2014-02-02 MED ORDER — PHENYLEPHRINE HCL 1 % NA SOLN
NASAL | Status: AC
  Administered 2014-02-02: 1 [drp]
  Filled 2014-02-02: qty 15

## 2014-02-02 MED ORDER — SILVER NITRATE-POT NITRATE 75-25 % EX MISC
CUTANEOUS | Status: AC
  Filled 2014-02-02: qty 1

## 2014-02-02 NOTE — ED Notes (Signed)
Pt presents with nosebleed that began earlier this morning. Pt denies injury as cause to bleeding. Pt reports hx of nosebleeds. Pt currently take coumadin and plavix. Pt denies pain, denies difficulty breathing.

## 2014-02-02 NOTE — ED Notes (Signed)
Nosebleed starting this morning.  Pt takes coumadin.

## 2014-02-02 NOTE — ED Provider Notes (Signed)
CSN: [id number]     Arrival date & time 02/02/14  0811 History  This chart was scribed for [name] Dakin, MD by [name], ED Scribe. The patient was seen in room APA19/APA19. Patient's care was started at 8:30 AM.   Chief Complaint  Patient presents with  . Epistaxis   The history is provided by the patient. No language interpreter was used.   HPI Comments: [name] is a 68 y.o. male with a history of COPD and CABG who presents to the Emergency Department complaining of epistaxis out of the right sided nose onset 1 hour ago. He reports that he was sitting when it began. He states that he feels fine right now. He reports taking the blood thinners Plavix and Coumadin after having two valves replaced. He reports seeing a cardiologist with the [facility] who prescribes these medications. He is not a smoker and he does not use drugs. He drinks occasionally.   Past Medical History  Diagnosis Date  . Essential hypertension, benign   . Coronary atherosclerosis of native coronary artery     a. CABG/MVR/AVR-2003 (#25 St. Jude/#21 St. Jude); anticoagulation; b. negative stress nuclear-2005;  c. 01/2013 NSTEMI/Cath/PCI: LM nl, LAD 50p, 40-38m, D1 50ost, LCX 27m, RCA 50/56m (2.5x20 Promus DES), 50d, VG->Diag 100, VG->PDA 100, VG->OM 100, LIMA->LAD ok.  . Epistaxis     Requiring cautery & aterial ligation-09/2009  . GERD (gastroesophageal reflux disease)   . Hyperlipemia   . Abnormal LFTs     Possible cirrhosis  . Chronic anticoagulation   . Valvular heart disease     a. 2003: MVR/AVR-2003 (#25 St. Jude/#21 St. Jude);  b. 01/2013 Echo: EF 55%, Mech AVR mean grad 12, Mech MVR mean grad 6.  . Tobacco abuse     45 pack years  . Fasting hyperglycemia   . Nephrolithiasis   . Diverticulosis   . Hyponatremia   . Chronic diastolic CHF (congestive heart failure)   . Chronic renal disease   . Hemolytic anemia   . ETOH abuse   . Atrial fibrillation 01/16/2013    On coumadin DCCV 07/2013.   [name]  COPD (chronic obstructive pulmonary disease)    Past Surgical History  Procedure Laterality Date  . Endocopic sphenopalatine artery ligation & cautry    . Inguinal hernia repair      Left & right  . Coronary artery bypass graft  11/2001    [facility]  . Cardiac valve replacement  11/2001    AVR and MVR-St. Jude devices  . Cataract extraction w/phaco  01/20/2011    Procedure: CATARACT EXTRACTION PHACO AND INTRAOCULAR LENS PLACEMENT (IOC);  Surgeon: Elta Guadeloupe T. Gershon Crane;  Location: [facility];  Service: Ophthalmology;  Laterality: Right;  CDE: 8.51  . Cataract extraction w/phaco  02/03/2011    Procedure: CATARACT EXTRACTION PHACO AND INTRAOCULAR LENS PLACEMENT (IOC);  Surgeon: Elta Guadeloupe T. Gershon Crane;  Location: [facility];  Service: Ophthalmology;  Laterality: Left;  CDE:10.01  . Colonoscopy  04/05/02; 08/2011    friable anal canal hemorrhoids otherwise normal; 2 diminutive polyps excised, minimal diverticulosis noted  . Esophagogastroduodenoscopy  01/2002    Dr. Laural Golden, submucosal esophageal lesion c/w leiomyoma  . Colonoscopy  09/02/2011    Procedure: COLONOSCOPY;  Surgeon: Daneil Dolin, MD;  Location: [facility];  Service: Endoscopy;  Laterality: N/A;  8:15  . Yag laser application Right 82/50/5397    Procedure: YAG LASER APPLICATION;  Surgeon: Elta Guadeloupe T. Gershon Crane, MD;  Location: [facility];  Service: Ophthalmology;  Laterality: Right;   Family History  Problem Relation Age of Onset  . Hypotension Neg Hx   . Anesthesia problems Neg Hx   . Malignant hyperthermia Neg Hx   . Pseudochol deficiency Neg Hx   . Colon cancer Neg Hx   . Liver disease Neg Hx    History  Substance Use Topics  . Smoking status: Former Smoker -- 0.25 packs/day for 52 years    Types: Cigarettes    Quit date: 01/03/2014  . Smokeless tobacco: Current User     Comment: STATES HE IS CUTTING DOWN now only 1/2 ppd (01/27/2013)  . Alcohol Use: 4.2 oz/week    6 Cans of beer, 1 Shots of liquor per week     Comment: Daily    Review  of Systems  Constitutional: Negative.   HENT: Positive for nosebleeds.   Respiratory: Negative.   Cardiovascular: Negative.   Gastrointestinal: Negative.   Musculoskeletal: Negative.   Skin: Negative.   Neurological: Negative.   Hematological: Bruises/bleeds easily.  Psychiatric/Behavioral: Negative.   All other systems reviewed and are negative.   Allergies  Review of patient's allergies indicates no known allergies.  Home Medications   Prior to Admission medications   Medication Sig Start Date End Date Taking? Authorizing Provider  acetaminophen (TYLENOL) 650 MG suppository Place 1 suppository (650 mg total) rectally every 6 (six) hours as needed for fever or mild pain. 12/06/13   Alonza Bogus, MD  albuterol (PROVENTIL) (2.5 MG/3ML) 0.083% nebulizer solution Take 3 mLs (2.5 mg total) by nebulization every 2 (two) hours as needed for wheezing. 12/06/13   Alonza Bogus, MD  Amino Acids-Protein Hydrolys (FEEDING SUPPLEMENT, PRO-STAT SUGAR FREE 64,) LIQD Take 30 mLs by mouth 2 (two) times daily at 10 am and 4 pm. 12/06/13   Alonza Bogus, MD  amLODipine (NORVASC) 10 MG tablet  12/15/13   Historical Provider, MD  atorvastatin (LIPITOR) 40 MG tablet TAKE ONE TABLET BY MOUTH ONCE DAILY **THIS REPLACES ZOCOR (SIMVASTATIN)** 12/15/13   Satira Sark, MD  clopidogrel (PLAVIX) 75 MG tablet Take 1 tablet (75 mg total) by mouth daily with breakfast. 07/03/13   Satira Sark, MD  feeding supplement, ENSURE COMPLETE, (ENSURE COMPLETE) LIQD Take 237 mLs by mouth 2 (two) times daily between meals. 12/06/13   Alonza Bogus, MD  ferrous sulfate 325 (65 FE) MG EC tablet Take 325 mg by mouth daily with breakfast.    Historical Provider, MD  folic acid (FOLVITE) 1 MG tablet Take 1 tablet (1 mg total) by mouth daily. 01/22/13   Brooke O Edmisten, PA-C  insulin aspart (NOVOLOG) 100 UNIT/ML injection Inject 0-9 Units into the skin 3 (three) times daily with meals. 12/06/13   Alonza Bogus, MD   levalbuterol Penne Lash) 0.63 MG/3ML nebulizer solution Take 3 mLs (0.63 mg total) by nebulization every 6 (six) hours as needed for wheezing or shortness of breath. 01/22/13   Azzie Roup Edmisten, PA-C  loperamide (IMODIUM) 2 MG capsule Take 2 capsules (4 mg total) by mouth every 12 (twelve) hours as needed for diarrhea or loose stools. 12/06/13   Alonza Bogus, MD  magnesium oxide (MAG-OX) 400 (241.3 MG) MG tablet Take 1 tablet (400 mg total) by mouth daily. 09/12/13   Lendon Colonel, NP  metolazone (ZAROXOLYN) 2.5 MG tablet Take 2.5 mg (1 tablet) every week as needed for swelling. 01/26/14   Arnoldo Lenis, MD  metoprolol (LOPRESSOR) 100 MG tablet Take 1 tablet (  100 mg total) by mouth 2 (two) times daily. 07/03/13   Satira Sark, MD  Multiple Vitamin (MULTIVITAMIN WITH MINERALS) TABS Take 1 tablet by mouth daily. 01/22/13   Brooke O Edmisten, PA-C  nitroGLYCERIN (NITROSTAT) 0.4 MG SL tablet Place 1 tablet (0.4 mg total) under the tongue every 5 (five) minutes x 3 doses as needed for chest pain. 01/31/13   Rogelia Mire, NP  pantoprazole (PROTONIX) 40 MG tablet TAKE ONE TABLET BY MOUTH ONCE DAILY 01/04/14   Lendon Colonel, NP  potassium chloride SA (K-DUR,KLOR-CON) 20 MEQ tablet Take 1 tablet (20 mEq total) by mouth daily. 12/18/13   Arnoldo Lenis, MD  thiamine 100 MG tablet Take 1 tablet (100 mg total) by mouth daily. 01/22/13   Brooke O Edmisten, PA-C  torsemide (DEMADEX) 20 MG tablet Take 3 tablets (60 mg total) by mouth 2 (two) times daily. 01/26/14   Arnoldo Lenis, MD  warfarin (COUMADIN) 5 MG tablet Take 5-7.5 mg by mouth daily. Takes 7.5mg  on Mondays & 5mg  all other days of week    Historical Provider, MD   Triage Vitals: BP 129/58  Pulse 78  Temp(Src) 97.8 F (36.6 C) (Oral)  Resp 20  Ht 5\' 6"  (1.676 m)  Wt 158 lb (71.668 kg)  BMI 25.51 kg/m2 Physical Exam  Nursing note and vitals reviewed. Constitutional: He appears well-developed and well-nourished.  HENT:   Head: Normocephalic and atraumatic.  Active bleeding from right nare. Stopped after clots removed and treated with neoencephalon spray. No bleeding site identified. Nose inspected with headlamp and  Nasal speculum.   Eyes: Conjunctivae are normal. Pupils are equal, round, and reactive to light.  Neck: Neck supple. No tracheal deviation present. No thyromegaly present.  Cardiovascular: Normal rate.  An irregularly irregular rhythm present.  No murmur heard. Pulmonary/Chest: Effort normal and breath sounds normal.  Abdominal: Soft. Bowel sounds are normal. He exhibits no distension. There is no tenderness.  Musculoskeletal: Normal range of motion. He exhibits no edema and no tenderness.  Neurological: He is alert. Coordination normal.  Skin: Skin is warm and dry. No rash noted.  Psychiatric: He has a normal mood and affect.   Results for orders placed during the hospital encounter of 02/02/14  CBC WITH DIFFERENTIAL      Result Value Ref Range   WBC 6.7  4.0 - 10.5 K/uL   RBC 3.63 (*) 4.22 - 5.81 MIL/uL   Hemoglobin 10.4 (*) 13.0 - 17.0 g/dL   HCT 32.2 (*) 39.0 - 52.0 %   MCV 88.7  78.0 - 100.0 fL   MCH 28.7  26.0 - 34.0 pg   MCHC 32.3  30.0 - 36.0 g/dL   RDW 16.6 (*) 11.5 - 15.5 %   Platelets 289  150 - 400 K/uL   Neutrophils Relative % 80 (*) 43 - 77 %   Neutro Abs 5.3  1.7 - 7.7 K/uL   Lymphocytes Relative 8 (*) 12 - 46 %   Lymphs Abs 0.6 (*) 0.7 - 4.0 K/uL   Monocytes Relative 9  3 - 12 %   Monocytes Absolute 0.6  0.1 - 1.0 K/uL   Eosinophils Relative 3  0 - 5 %   Eosinophils Absolute 0.2  0.0 - 0.7 K/uL   Basophils Relative 0  0 - 1 %   Basophils Absolute 0.0  0.0 - 0.1 K/uL  PROTIME-INR      Result Value Ref Range   Prothrombin Time 30.6 (*) 11.6 -  15.2 seconds   INR 2.93 (*) 0.00 - 1.49   No results found.   ED Course  Procedures (including critical care time) Achieved good hemostasis after Neo-Synephrine sprayed into the right nares.   COORDINATION OF CARE: 8:35  AM-Discussed treatment plan with pt at bedside and pt agreed to plan.  At 9:15 AM patient began to lose slight amount right nares again. His Stanton Kidney was again sprayed with Neo-Synephrine spray and a 7.5 cm rapid rhino nasal tampon inserted with good hemostasis Labs Review Labs Reviewed  CBC WITH DIFFERENTIAL  PROTIME-INR    Imaging Review No results found.   EKG Interpretation None     10:30 AM no further bleeding. Patient is comfortable MDM   anemia is chronic and unchanged Patient vehemently denies dyspnea on room air despite low pulse oximetry. He was placed on 2 L of oxygen. He states he was wearing his oxygen immediately before respiratory. He will wear oxygen for the car ride home. He has seen Dr. Wilburn Cornelia for nosebleeds in the past and has had his nose cauterized Plan he is instructed to call Dr. Wilburn Cornelia today. Arrange to get nasal packing removed in 4 or 5 days. Results for orders placed during the hospital encounter of 02/02/14  CBC WITH DIFFERENTIAL      Result Value Ref Range   WBC 6.7  4.0 - 10.5 K/uL   RBC 3.63 (*) 4.22 - 5.81 MIL/uL   Hemoglobin 10.4 (*) 13.0 - 17.0 g/dL   HCT 32.2 (*) 39.0 - 52.0 %   MCV 88.7  78.0 - 100.0 fL   MCH 28.7  26.0 - 34.0 pg   MCHC 32.3  30.0 - 36.0 g/dL   RDW 16.6 (*) 11.5 - 15.5 %   Platelets 289  150 - 400 K/uL   Neutrophils Relative % 80 (*) 43 - 77 %   Neutro Abs 5.3  1.7 - 7.7 K/uL   Lymphocytes Relative 8 (*) 12 - 46 %   Lymphs Abs 0.6 (*) 0.7 - 4.0 K/uL   Monocytes Relative 9  3 - 12 %   Monocytes Absolute 0.6  0.1 - 1.0 K/uL   Eosinophils Relative 3  0 - 5 %   Eosinophils Absolute 0.2  0.0 - 0.7 K/uL   Basophils Relative 0  0 - 1 %   Basophils Absolute 0.0  0.0 - 0.1 K/uL  PROTIME-INR      Result Value Ref Range   Prothrombin Time 30.6 (*) 11.6 - 15.2 seconds   INR 2.93 (*) 0.00 - 1.49   No results found.  Final diagnoses:  None   diagnosis #1 epistaxis  #2 anemia I personally performed the services described in  this documentation, which was scribed in my presence. The recorded information has been reviewed and considered.       [name] Dakin, MD 02/02/14 1041

## 2014-02-02 NOTE — ED Notes (Signed)
Pt has been given d/c instructions. Pt is on O2 and is waiting for ride in room so he may remain on O2 while waiting.

## 2014-02-02 NOTE — Discharge Instructions (Signed)
Nosebleed Call Dr. Victorio Palm office today to arrange to have packing pulled out in 4 or 5 days A nosebleed can be caused by many things, including:  Getting hit hard in the nose.  Infections.  Dry nose.  Colds.  Medicines. Your doctor may do lab testing if you get nosebleeds a lot and the cause is not known. HOME CARE   If your nose was packed with material, keep it there until your doctor takes it out. Put the pack back in your nose if the pack falls out.  Do not blow your nose for 12 hours after the nosebleed.  Sit up and bend forward if your nose starts bleeding again. Pinch the front half of your nose nonstop for 20 minutes.  Put petroleum jelly inside your nose every morning if you have a dry nose.  Use a humidifier to make the air less dry.  Do not take aspirin.  Try not to strain, lift, or bend at the waist for many days after the nosebleed. GET HELP RIGHT AWAY IF:   Nosebleeds keep happening and are hard to stop or control.  You have bleeding or bruises that are not normal on other parts of the body.  You have a fever.  The nosebleeds get worse.  You get lightheaded, feel faint, sweaty, or throw up (vomit) blood. MAKE SURE YOU:   Understand these instructions.  Will watch your condition.  Will get help right away if you are not doing well or get worse. Document Released: 03/31/2008 Document Revised: 09/14/2011 Document Reviewed: 03/31/2008 [facility] Patient Information 2015 [place], [place]. This information is not intended to replace advice given to you by your health care provider. Make sure you discuss any questions you have with your health care provider.

## 2014-02-04 ENCOUNTER — Inpatient Hospital Stay (HOSPITAL_COMMUNITY)
Admission: EM | Admit: 2014-02-04 | Discharge: 2014-02-12 | DRG: 150 | Disposition: A | Discharge status: Home Health | Payer: Medicare Other | Attending: Pulmonary Disease | Admitting: Pulmonary Disease

## 2014-02-04 ENCOUNTER — Emergency Department (HOSPITAL_COMMUNITY)
Admission: EM | Admit: 2014-02-04 | Discharge: 2014-02-04 | Disposition: A | Discharge status: Home / Self Care | Payer: Medicare Other | Source: Home / Self Care | Attending: Emergency Medicine | Admitting: Emergency Medicine

## 2014-02-04 ENCOUNTER — Encounter (HOSPITAL_COMMUNITY): Payer: Self-pay | Admitting: Emergency Medicine

## 2014-02-04 DIAGNOSIS — I4891 Unspecified atrial fibrillation: Secondary | ICD-10-CM | POA: Insufficient documentation

## 2014-02-04 DIAGNOSIS — J690 Pneumonitis due to inhalation of food and vomit: Secondary | ICD-10-CM | POA: Diagnosis present

## 2014-02-04 DIAGNOSIS — Z87891 Personal history of nicotine dependence: Secondary | ICD-10-CM | POA: Insufficient documentation

## 2014-02-04 DIAGNOSIS — Z7901 Long term (current) use of anticoagulants: Secondary | ICD-10-CM

## 2014-02-04 DIAGNOSIS — E785 Hyperlipidemia, unspecified: Secondary | ICD-10-CM

## 2014-02-04 DIAGNOSIS — R040 Epistaxis: Secondary | ICD-10-CM

## 2014-02-04 DIAGNOSIS — R0902 Hypoxemia: Secondary | ICD-10-CM

## 2014-02-04 DIAGNOSIS — N189 Chronic kidney disease, unspecified: Secondary | ICD-10-CM

## 2014-02-04 DIAGNOSIS — I5032 Chronic diastolic (congestive) heart failure: Secondary | ICD-10-CM | POA: Insufficient documentation

## 2014-02-04 DIAGNOSIS — Z951 Presence of aortocoronary bypass graft: Secondary | ICD-10-CM | POA: Insufficient documentation

## 2014-02-04 DIAGNOSIS — Z952 Presence of prosthetic heart valve: Secondary | ICD-10-CM

## 2014-02-04 DIAGNOSIS — I252 Old myocardial infarction: Secondary | ICD-10-CM

## 2014-02-04 DIAGNOSIS — K219 Gastro-esophageal reflux disease without esophagitis: Secondary | ICD-10-CM | POA: Diagnosis present

## 2014-02-04 DIAGNOSIS — Z954 Presence of other heart-valve replacement: Secondary | ICD-10-CM

## 2014-02-04 DIAGNOSIS — J449 Chronic obstructive pulmonary disease, unspecified: Secondary | ICD-10-CM

## 2014-02-04 DIAGNOSIS — I129 Hypertensive chronic kidney disease with stage 1 through stage 4 chronic kidney disease, or unspecified chronic kidney disease: Secondary | ICD-10-CM | POA: Diagnosis present

## 2014-02-04 DIAGNOSIS — E43 Unspecified severe protein-calorie malnutrition: Secondary | ICD-10-CM | POA: Diagnosis present

## 2014-02-04 DIAGNOSIS — Z7902 Long term (current) use of antithrombotics/antiplatelets: Secondary | ICD-10-CM

## 2014-02-04 DIAGNOSIS — Z79899 Other long term (current) drug therapy: Secondary | ICD-10-CM

## 2014-02-04 DIAGNOSIS — I2510 Atherosclerotic heart disease of native coronary artery without angina pectoris: Secondary | ICD-10-CM

## 2014-02-04 DIAGNOSIS — Z5181 Encounter for therapeutic drug level monitoring: Secondary | ICD-10-CM

## 2014-02-04 DIAGNOSIS — D599 Acquired hemolytic anemia, unspecified: Secondary | ICD-10-CM | POA: Diagnosis present

## 2014-02-04 DIAGNOSIS — I10 Essential (primary) hypertension: Secondary | ICD-10-CM

## 2014-02-04 DIAGNOSIS — J4489 Other specified chronic obstructive pulmonary disease: Secondary | ICD-10-CM | POA: Diagnosis present

## 2014-02-04 DIAGNOSIS — Z87442 Personal history of urinary calculi: Secondary | ICD-10-CM | POA: Insufficient documentation

## 2014-02-04 DIAGNOSIS — Z9981 Dependence on supplemental oxygen: Secondary | ICD-10-CM | POA: Diagnosis not present

## 2014-02-04 DIAGNOSIS — I872 Venous insufficiency (chronic) (peripheral): Secondary | ICD-10-CM | POA: Diagnosis present

## 2014-02-04 DIAGNOSIS — I5043 Acute on chronic combined systolic (congestive) and diastolic (congestive) heart failure: Secondary | ICD-10-CM | POA: Diagnosis not present

## 2014-02-04 DIAGNOSIS — R791 Abnormal coagulation profile: Secondary | ICD-10-CM

## 2014-02-04 DIAGNOSIS — IMO0002 Reserved for concepts with insufficient information to code with codable children: Secondary | ICD-10-CM | POA: Diagnosis not present

## 2014-02-04 DIAGNOSIS — J9620 Acute and chronic respiratory failure, unspecified whether with hypoxia or hypercapnia: Secondary | ICD-10-CM | POA: Diagnosis not present

## 2014-02-04 DIAGNOSIS — F17200 Nicotine dependence, unspecified, uncomplicated: Secondary | ICD-10-CM

## 2014-02-04 DIAGNOSIS — Z9889 Other specified postprocedural states: Secondary | ICD-10-CM

## 2014-02-04 DIAGNOSIS — I5033 Acute on chronic diastolic (congestive) heart failure: Secondary | ICD-10-CM

## 2014-02-04 DIAGNOSIS — F1010 Alcohol abuse, uncomplicated: Secondary | ICD-10-CM | POA: Diagnosis present

## 2014-02-04 DIAGNOSIS — R05 Cough: Secondary | ICD-10-CM

## 2014-02-04 DIAGNOSIS — I509 Heart failure, unspecified: Secondary | ICD-10-CM | POA: Diagnosis present

## 2014-02-04 DIAGNOSIS — R059 Cough, unspecified: Secondary | ICD-10-CM | POA: Insufficient documentation

## 2014-02-04 DIAGNOSIS — R931 Abnormal findings on diagnostic imaging of heart and coronary circulation: Secondary | ICD-10-CM

## 2014-02-04 DIAGNOSIS — D649 Anemia, unspecified: Secondary | ICD-10-CM

## 2014-02-04 DIAGNOSIS — J9621 Acute and chronic respiratory failure with hypoxia: Secondary | ICD-10-CM

## 2014-02-04 LAB — CBC WITH DIFFERENTIAL/PLATELET
BASOS ABS: 0 10*3/uL (ref 0.0–0.1)
Basophils Relative: 1 % (ref 0–1)
EOS PCT: 2 % (ref 0–5)
Eosinophils Absolute: 0.1 10*3/uL (ref 0.0–0.7)
HCT: 32.7 % — ABNORMAL LOW (ref 39.0–52.0)
HEMOGLOBIN: 10.4 g/dL — AB (ref 13.0–17.0)
Lymphocytes Relative: 4 % — ABNORMAL LOW (ref 12–46)
Lymphs Abs: 0.3 10*3/uL — ABNORMAL LOW (ref 0.7–4.0)
MCH: 28.2 pg (ref 26.0–34.0)
MCHC: 31.8 g/dL (ref 30.0–36.0)
MCV: 88.6 fL (ref 78.0–100.0)
MONOS PCT: 12 % (ref 3–12)
Monocytes Absolute: 1 10*3/uL (ref 0.1–1.0)
Neutro Abs: 6.6 10*3/uL (ref 1.7–7.7)
Neutrophils Relative %: 81 % — ABNORMAL HIGH (ref 43–77)
Platelets: 258 10*3/uL (ref 150–400)
RBC: 3.69 MIL/uL — ABNORMAL LOW (ref 4.22–5.81)
RDW: 16.8 % — AB (ref 11.5–15.5)
WBC: 8.1 10*3/uL (ref 4.0–10.5)

## 2014-02-04 LAB — BASIC METABOLIC PANEL
ANION GAP: 13 (ref 5–15)
BUN: 45 mg/dL — AB (ref 6–23)
CALCIUM: 9.4 mg/dL (ref 8.4–10.5)
CO2: 31 meq/L (ref 19–32)
CREATININE: 1.33 mg/dL (ref 0.50–1.35)
Chloride: 89 mEq/L — ABNORMAL LOW (ref 96–112)
GFR calc Af Amer: 62 mL/min — ABNORMAL LOW (ref >90)
GFR calc non Af Amer: 53 mL/min — ABNORMAL LOW (ref >90)
Glucose, Bld: 108 mg/dL — ABNORMAL HIGH (ref 70–99)
Potassium: 3.8 mEq/L (ref 3.7–5.3)
Sodium: 133 mEq/L — ABNORMAL LOW (ref 137–147)

## 2014-02-04 LAB — PROTIME-INR
INR: 2.65 — ABNORMAL HIGH (ref 0.00–1.49)
Prothrombin Time: 28.3 seconds — ABNORMAL HIGH (ref 11.6–15.2)

## 2014-02-04 MED ORDER — WARFARIN - PHARMACIST DOSING INPATIENT
Status: DC
  Administered 2014-02-06: 17:00:00

## 2014-02-04 MED ORDER — ATORVASTATIN CALCIUM 40 MG PO TABS
40.0000 mg | ORAL_TABLET | Freq: Every day | ORAL | Status: DC
  Administered 2014-02-05 – 2014-02-06 (×2): 40 mg via ORAL
  Filled 2014-02-04 (×2): qty 1

## 2014-02-04 MED ORDER — METOPROLOL TARTRATE 50 MG PO TABS
100.0000 mg | ORAL_TABLET | Freq: Two times a day (BID) | ORAL | Status: DC
  Administered 2014-02-05 – 2014-02-06 (×4): 100 mg via ORAL
  Filled 2014-02-04 (×4): qty 2

## 2014-02-04 MED ORDER — CLOPIDOGREL BISULFATE 75 MG PO TABS
75.0000 mg | ORAL_TABLET | Freq: Every day | ORAL | Status: DC
  Administered 2014-02-05 – 2014-02-12 (×7): 75 mg via ORAL
  Filled 2014-02-04 (×7): qty 1

## 2014-02-04 MED ORDER — SODIUM CHLORIDE 0.9 % IJ SOLN
3.0000 mL | Freq: Two times a day (BID) | INTRAMUSCULAR | Status: DC
  Administered 2014-02-05 – 2014-02-09 (×9): 3 mL via INTRAVENOUS

## 2014-02-04 MED ORDER — ALBUTEROL SULFATE (2.5 MG/3ML) 0.083% IN NEBU
2.5000 mg | INHALATION_SOLUTION | RESPIRATORY_TRACT | Status: DC | PRN
  Administered 2014-02-06: 2.5 mg via RESPIRATORY_TRACT
  Filled 2014-02-04: qty 3

## 2014-02-04 MED ORDER — SODIUM CHLORIDE 0.9 % IV SOLN
250.0000 mL | INTRAVENOUS | Status: DC | PRN
  Administered 2014-02-06: 250 mL via INTRAVENOUS

## 2014-02-04 MED ORDER — VITAMIN B-1 100 MG PO TABS
100.0000 mg | ORAL_TABLET | Freq: Every day | ORAL | Status: DC
  Administered 2014-02-05 – 2014-02-06 (×2): 100 mg via ORAL
  Filled 2014-02-04 (×2): qty 1

## 2014-02-04 MED ORDER — TORSEMIDE 20 MG PO TABS
60.0000 mg | ORAL_TABLET | Freq: Two times a day (BID) | ORAL | Status: DC
  Administered 2014-02-05 – 2014-02-06 (×3): 60 mg via ORAL
  Filled 2014-02-04 (×3): qty 3

## 2014-02-04 MED ORDER — POTASSIUM CHLORIDE CRYS ER 20 MEQ PO TBCR
20.0000 meq | EXTENDED_RELEASE_TABLET | Freq: Every day | ORAL | Status: DC
  Administered 2014-02-05 – 2014-02-06 (×2): 20 meq via ORAL
  Filled 2014-02-04 (×2): qty 1

## 2014-02-04 MED ORDER — LOPERAMIDE HCL 2 MG PO CAPS: 4.0000 mg | ORAL_CAPSULE | Freq: Two times a day (BID) | ORAL | Status: DC | PRN

## 2014-02-04 MED ORDER — SODIUM CHLORIDE 0.9 % IJ SOLN: 3.0000 mL | INTRAMUSCULAR | Status: DC | PRN

## 2014-02-04 MED ORDER — ACETAMINOPHEN 500 MG PO TABS
1000.0000 mg | ORAL_TABLET | Freq: Every day | ORAL | Status: DC | PRN
  Administered 2014-02-10: 1000 mg via ORAL
  Filled 2014-02-04: qty 2

## 2014-02-04 MED ORDER — PANTOPRAZOLE SODIUM 40 MG PO TBEC
40.0000 mg | DELAYED_RELEASE_TABLET | Freq: Every day | ORAL | Status: DC
  Administered 2014-02-05 – 2014-02-06 (×2): 40 mg via ORAL
  Filled 2014-02-04 (×2): qty 1

## 2014-02-04 MED ORDER — AMLODIPINE BESYLATE 5 MG PO TABS
10.0000 mg | ORAL_TABLET | Freq: Every day | ORAL | Status: DC
  Administered 2014-02-05 – 2014-02-06 (×2): 10 mg via ORAL
  Filled 2014-02-04 (×2): qty 2

## 2014-02-04 NOTE — ED Notes (Signed)
Left nares packed with rhinorocket by dr bednar.

## 2014-02-04 NOTE — Progress Notes (Signed)
ANTICOAGULATION CONSULT NOTE - Initial Consult  Pharmacy Consult for Warfarin Indication: MVR/AVR  No Known Allergies  Patient Measurements: Height: 5\' 6"  (167.6 cm) Weight: 153 lb (69.4 kg) IBW/kg (Calculated) : 63.8   Vital Signs: Temp: 97.6 F (36.4 C) (08/02 2142) Temp src: Oral (08/02 2142) BP: 138/87 mmHg (08/02 2142) Pulse Rate: 79 (08/02 2142)  Labs:  Recent Labs  02/02/14 0829 02/04/14 1903  HGB 10.4* 10.4*  HCT 32.2* 32.7*  PLT 289 258  LABPROT 30.6* 28.3*  INR 2.93* 2.65*  CREATININE  --  1.33    Estimated Creatinine Clearance: 48 ml/min (by C-G formula based on Cr of 1.33).   Medical History: Past Medical History  Diagnosis Date  . Essential hypertension, benign   . Coronary atherosclerosis of native coronary artery     a. CABG/MVR/AVR-2003 (#25 St. Jude/#21 St. Jude); anticoagulation; b. negative stress nuclear-2005;  c. 01/2013 NSTEMI/Cath/PCI: LM nl, LAD 50p, 40-38m, D1 50ost, LCX 9m, RCA 50/12m (2.5x20 Promus DES), 50d, VG->Diag 100, VG->PDA 100, VG->OM 100, LIMA->LAD ok.  . Epistaxis     Requiring cautery & aterial ligation-09/2009  . GERD (gastroesophageal reflux disease)   . Hyperlipemia   . Abnormal LFTs     Possible cirrhosis  . Chronic anticoagulation   . Valvular heart disease     a. 2003: MVR/AVR-2003 (#25 St. Jude/#21 St. Jude);  b. 01/2013 Echo: EF 55%, Mech AVR mean grad 12, Mech MVR mean grad 6.  . Tobacco abuse     45 pack years  . Fasting hyperglycemia   . Nephrolithiasis   . Diverticulosis   . Hyponatremia   . Chronic diastolic CHF (congestive heart failure)   . Chronic renal disease   . Hemolytic anemia   . ETOH abuse   . Atrial fibrillation 01/16/2013    On coumadin DCCV 07/2013.   Marland Kitchen COPD (chronic obstructive pulmonary disease)     Medications:  Prescriptions prior to admission  Medication Sig Dispense Refill  . albuterol (PROVENTIL) (2.5 MG/3ML) 0.083% nebulizer solution Take 3 mLs (2.5 mg total) by nebulization  every 2 (two) hours as needed for wheezing.  75 mL  12  . amLODipine (NORVASC) 10 MG tablet Take 10 mg by mouth daily.       Marland Kitchen atorvastatin (LIPITOR) 40 MG tablet Take 40 mg by mouth daily. This Replace Zocor(Simvastatin)      . clopidogrel (PLAVIX) 75 MG tablet Take 1 tablet (75 mg total) by mouth daily with breakfast.  30 tablet  6  . ferrous sulfate 325 (65 FE) MG EC tablet Take 325 mg by mouth daily with breakfast.      . folic acid (FOLVITE) 1 MG tablet Take 1 tablet (1 mg total) by mouth daily.  30 tablet  4  . levalbuterol (XOPENEX) 0.63 MG/3ML nebulizer solution Take 3 mLs (0.63 mg total) by nebulization every 6 (six) hours as needed for wheezing or shortness of breath.  3 mL  4  . magnesium oxide (MAG-OX) 400 (241.3 MG) MG tablet Take 1 tablet (400 mg total) by mouth daily.  30 tablet  6  . metolazone (ZAROXOLYN) 2.5 MG tablet Take 2.5 mg (1 tablet) every week as needed for swelling.  12 tablet  3  . metoprolol (LOPRESSOR) 100 MG tablet Take 1 tablet (100 mg total) by mouth 2 (two) times daily.  60 tablet  6  . Multiple Vitamin (MULTIVITAMIN WITH MINERALS) TABS Take 1 tablet by mouth daily.      . pantoprazole (PROTONIX)  40 MG tablet Take 40 mg by mouth daily.      . potassium chloride SA (K-DUR,KLOR-CON) 20 MEQ tablet Take 1 tablet (20 mEq total) by mouth daily.  90 tablet  3  . thiamine 100 MG tablet Take 1 tablet (100 mg total) by mouth daily.  30 tablet  4  . torsemide (DEMADEX) 20 MG tablet Take 3 tablets (60 mg total) by mouth 2 (two) times daily.  180 tablet  6  . warfarin (COUMADIN) 5 MG tablet Take 5-7.5 mg by mouth daily. Takes 7.5mg  on Mondays & 5mg  all other days of week      . acetaminophen (TYLENOL) 500 MG tablet Take 1,000 mg by mouth daily as needed for mild pain or headache.      . loperamide (IMODIUM) 2 MG capsule Take 2 capsules (4 mg total) by mouth every 12 (twelve) hours as needed for diarrhea or loose stools.  30 capsule  0  . nitroGLYCERIN (NITROSTAT) 0.4 MG SL  tablet Place 1 tablet (0.4 mg total) under the tongue every 5 (five) minutes x 3 doses as needed for chest pain.  25 tablet  3    Assessment: Okay for Protocol, recent bleeding epistaxis noted. INR at goal range, last dose 8/1.  Spoke w/ admitting MD and plan to not give additional dose this evening.  Assess labs and bleeding risk in AM.  Goal of Therapy:  INR 2.5- 3.5   Plan:  No warfarin today. Daily PT/INR.  Biagio Quint R 02/04/2014,9:53 PM

## 2014-02-04 NOTE — ED Provider Notes (Signed)
CSN: [id number]     Arrival date & time 02/04/14  0024 History   First MD Initiated Contact with Patient 02/04/14 0106     Chief Complaint  Patient presents with  . Epistaxis      Patient is a 68 y.o. male presenting with nosebleeds. The history is provided by the patient.  Epistaxis Location:  R nare Severity:  Moderate Duration:  3 hours Timing:  Constant Progression:  Worsening Chronicity:  Recurrent Context: anticoagulants   Relieved by: pressure. Worsened by:  Nothing tried Associated symptoms: cough   Associated symptoms: no fever   pt seen on 7/31 for epistaxis and had nasal tampon inserted into right nare He did well until tonight when it fell out and he began to bleed from nare He is on coumadin He has mild cough but no other complaints   Past Medical History  Diagnosis Date  . Essential hypertension, benign   . Coronary atherosclerosis of native coronary artery     a. CABG/MVR/AVR-2003 (#25 St. Jude/#21 St. Jude); anticoagulation; b. negative stress nuclear-2005;  c. 01/2013 NSTEMI/Cath/PCI: LM nl, LAD 50p, 40-41m, D1 50ost, LCX 109m, RCA 50/81m (2.5x20 Promus DES), 50d, VG->Diag 100, VG->PDA 100, VG->OM 100, LIMA->LAD ok.  . Epistaxis     Requiring cautery & aterial ligation-09/2009  . GERD (gastroesophageal reflux disease)   . Hyperlipemia   . Abnormal LFTs     Possible cirrhosis  . Chronic anticoagulation   . Valvular heart disease     a. 2003: MVR/AVR-2003 (#25 St. Jude/#21 St. Jude);  b. 01/2013 Echo: EF 55%, Mech AVR mean grad 12, Mech MVR mean grad 6.  . Tobacco abuse     45 pack years  . Fasting hyperglycemia   . Nephrolithiasis   . Diverticulosis   . Hyponatremia   . Chronic diastolic CHF (congestive heart failure)   . Chronic renal disease   . Hemolytic anemia   . ETOH abuse   . Atrial fibrillation 01/16/2013    On coumadin DCCV 07/2013.   Marland Kitchen COPD (chronic obstructive pulmonary disease)    Past Surgical History  Procedure Laterality Date  .  Endocopic sphenopalatine artery ligation & cautry    . Inguinal hernia repair      Left & right  . Coronary artery bypass graft  11/2001    [facility]  . Cardiac valve replacement  11/2001    AVR and MVR-St. Jude devices  . Cataract extraction w/phaco  01/20/2011    Procedure: CATARACT EXTRACTION PHACO AND INTRAOCULAR LENS PLACEMENT (IOC);  Surgeon: Elta Guadeloupe T. Gershon Crane;  Location: [facility];  Service: Ophthalmology;  Laterality: Right;  CDE: 8.51  . Cataract extraction w/phaco  02/03/2011    Procedure: CATARACT EXTRACTION PHACO AND INTRAOCULAR LENS PLACEMENT (IOC);  Surgeon: Elta Guadeloupe T. Gershon Crane;  Location: [facility];  Service: Ophthalmology;  Laterality: Left;  CDE:10.01  . Colonoscopy  04/05/02; 08/2011    friable anal canal hemorrhoids otherwise normal; 2 diminutive polyps excised, minimal diverticulosis noted  . Esophagogastroduodenoscopy  01/2002    Dr. Laural Golden, submucosal esophageal lesion c/w leiomyoma  . Colonoscopy  09/02/2011    Procedure: COLONOSCOPY;  Surgeon: Daneil Dolin, MD;  Location: [facility];  Service: Endoscopy;  Laterality: N/A;  8:15  . Yag laser application Right 38/18/2993    Procedure: YAG LASER APPLICATION;  Surgeon: Elta Guadeloupe T. Gershon Crane, MD;  Location: [facility];  Service: Ophthalmology;  Laterality: Right;   Family History  Problem Relation Age of Onset  . Hypotension Neg  Hx   . Anesthesia problems Neg Hx   . Malignant hyperthermia Neg Hx   . Pseudochol deficiency Neg Hx   . Colon cancer Neg Hx   . Liver disease Neg Hx    History  Substance Use Topics  . Smoking status: Former Smoker -- 0.25 packs/day for 52 years    Types: Cigarettes    Quit date: 01/03/2014  . Smokeless tobacco: Current User     Comment: STATES HE IS CUTTING DOWN now only 1/2 ppd (01/27/2013)  . Alcohol Use: 4.2 oz/week    6 Cans of beer, 1 Shots of liquor per week     Comment: Daily    Review of Systems  Constitutional: Negative for fever.  HENT: Positive for nosebleeds.   Respiratory:  Positive for cough.       Allergies  Review of patient's allergies indicates no known allergies.  Home Medications   Prior to Admission medications   Medication Sig Start Date End Date Taking? Authorizing Provider  acetaminophen (TYLENOL) 500 MG tablet Take 1,000 mg by mouth daily as needed for mild pain or headache.    Historical Provider, MD  albuterol (PROVENTIL) (2.5 MG/3ML) 0.083% nebulizer solution Take 3 mLs (2.5 mg total) by nebulization every 2 (two) hours as needed for wheezing. 12/06/13   Alonza Bogus, MD  amLODipine (NORVASC) 10 MG tablet Take 10 mg by mouth daily.  12/15/13   Historical Provider, MD  atorvastatin (LIPITOR) 40 MG tablet TAKE ONE TABLET BY MOUTH ONCE DAILY **THIS REPLACES ZOCOR (SIMVASTATIN)** 12/15/13   Satira Sark, MD  clopidogrel (PLAVIX) 75 MG tablet Take 1 tablet (75 mg total) by mouth daily with breakfast. 07/03/13   Satira Sark, MD  ferrous sulfate 325 (65 FE) MG EC tablet Take 325 mg by mouth daily with breakfast.    Historical Provider, MD  folic acid (FOLVITE) 1 MG tablet Take 1 tablet (1 mg total) by mouth daily. 01/22/13   Brooke O Edmisten, PA-C  levalbuterol (XOPENEX) 0.63 MG/3ML nebulizer solution Take 3 mLs (0.63 mg total) by nebulization every 6 (six) hours as needed for wheezing or shortness of breath. 01/22/13   Azzie Roup Edmisten, PA-C  loperamide (IMODIUM) 2 MG capsule Take 2 capsules (4 mg total) by mouth every 12 (twelve) hours as needed for diarrhea or loose stools. 12/06/13   Alonza Bogus, MD  magnesium oxide (MAG-OX) 400 (241.3 MG) MG tablet Take 1 tablet (400 mg total) by mouth daily. 09/12/13   Lendon Colonel, NP  metolazone (ZAROXOLYN) 2.5 MG tablet Take 2.5 mg (1 tablet) every week as needed for swelling. 01/26/14   Arnoldo Lenis, MD  metoprolol (LOPRESSOR) 100 MG tablet Take 1 tablet (100 mg total) by mouth 2 (two) times daily. 07/03/13   Satira Sark, MD  Multiple Vitamin (MULTIVITAMIN WITH MINERALS) TABS Take 1  tablet by mouth daily. 01/22/13   Brooke O Edmisten, PA-C  nitroGLYCERIN (NITROSTAT) 0.4 MG SL tablet Place 1 tablet (0.4 mg total) under the tongue every 5 (five) minutes x 3 doses as needed for chest pain. 01/31/13   Rogelia Mire, NP  pantoprazole (PROTONIX) 40 MG tablet TAKE ONE TABLET BY MOUTH ONCE DAILY 01/04/14   Lendon Colonel, NP  potassium chloride SA (K-DUR,KLOR-CON) 20 MEQ tablet Take 1 tablet (20 mEq total) by mouth daily. 12/18/13   Arnoldo Lenis, MD  thiamine 100 MG tablet Take 1 tablet (100 mg total) by mouth daily. 01/22/13   Azzie Roup  Edmisten, PA-C  torsemide (DEMADEX) 20 MG tablet Take 3 tablets (60 mg total) by mouth 2 (two) times daily. 01/26/14   Arnoldo Lenis, MD  warfarin (COUMADIN) 5 MG tablet Take 5-7.5 mg by mouth daily. Takes 7.5mg  on Mondays & 5mg  all other days of week    Historical Provider, MD   BP 146/70  Pulse 81  Temp(Src) 98.3 F (36.8 C) (Oral)  Resp 20  Ht 5' 6.5" (1.689 m)  Wt 158 lb (71.668 kg)  BMI 25.12 kg/m2  SpO2 92% Physical Exam CONSTITUTIONAL: Well developed/well nourished HEAD: Normocephalic/atraumatic EYES: EOMI/PERRL ENMT: Mucous membranes moist, active bleeding from right nare.  Clot noted.   NECK: supple no meningeal signs LUNGS:mild tachypnea noted no apparent distress ABDOMEN: soft, nontender, no rebound or guarding NEURO: Pt is awake/alert, moves all extremitiesx4 EXTREMITIES: pulses normal, full ROM SKIN: warm, color normal PSYCH: no abnormalities of mood noted  ED Course  EPISTAXIS MANAGEMENT Date/Time: 02/04/2014 1:40 AM Performed by: Sharyon Cable Authorized by: Sharyon Cable Consent: Verbal consent obtained. Patient sedated: no Treatment site: right anterior Repair method: rapid rhino 7.5. Post-procedure assessment: bleeding stopped Treatment complexity: simple Patient tolerance: Patient tolerated the procedure well with no immediate complications.    Pt had recent ED visit for similar  episode Nasal tampon fell out and then began to bleed He is on oxygen at home intermittently, this was applied to patient Will defer labs for now 2:11 AM Pt improved, no complaints at this time 3:03 AM Pt improved No further bleeding No blood in oropharynx He denies sob at this time He has f/u with ENT this week   MDM   Final diagnoses:  Epistaxis, recurrent    Nursing notes including past medical history and social history reviewed and considered in documentation Previous records reviewed and considered     Sharyon Cable, MD 02/04/14 [phone]

## 2014-02-04 NOTE — ED Provider Notes (Signed)
CSN: [id number]     Arrival date & time 02/04/14  1741 History  This chart was scribed for Babette Relic, MD by Girtha Hake, ED Scribe. The patient was seen in [place]. The patient's care was started at 5:57 PM.     Chief Complaint  Patient presents with  . Epistaxis   Patient is a 68 y.o. male presenting with nosebleeds. The history is provided by the patient. No language interpreter was used.  Epistaxis  HPI Comments: [name] is a 68 y.o. male who presents to the Emergency Department complaining of a recurrent nosebleed from the right side of the nose on and off for the last few days. Patient visited the ED on Friday and had the right side of his nose packed. Blood slowly oozing from right nasal packing today. Patient denies fever, CP, or lightheadedness. At baseline, he is on 2L/min of oxygen at home. Pt SOB mildly with hypoxia on arrival with nasal prong not in his nose.  Patient lives alone. He lives next door to his brother. PCP is Dr. Luan Pulling.   Past Medical History  Diagnosis Date  . Essential hypertension, benign   . Coronary atherosclerosis of native coronary artery     a. CABG/MVR/AVR-2003 (#25 St. Jude/#21 St. Jude); anticoagulation; b. negative stress nuclear-2005;  c. 01/2013 NSTEMI/Cath/PCI: LM nl, LAD 50p, 40-54m, D1 50ost, LCX 77m, RCA 50/61m (2.5x20 Promus DES), 50d, VG->Diag 100, VG->PDA 100, VG->OM 100, LIMA->LAD ok.  . Epistaxis     Requiring cautery & aterial ligation-09/2009  . GERD (gastroesophageal reflux disease)   . Hyperlipemia   . Abnormal LFTs     Possible cirrhosis  . Chronic anticoagulation   . Valvular heart disease     a. 2003: MVR/AVR-2003 (#25 St. Jude/#21 St. Jude);  b. 01/2013 Echo: EF 55%, Mech AVR mean grad 12, Mech MVR mean grad 6.  . Tobacco abuse     45 pack years  . Fasting hyperglycemia   . Nephrolithiasis   . Diverticulosis   . Hyponatremia   . Chronic diastolic CHF (congestive heart failure)   . Chronic renal disease   .  Hemolytic anemia   . ETOH abuse   . Atrial fibrillation 01/16/2013    On coumadin DCCV 07/2013.   Marland Kitchen COPD (chronic obstructive pulmonary disease)    Past Surgical History  Procedure Laterality Date  . Endocopic sphenopalatine artery ligation & cautry    . Inguinal hernia repair      Left & right  . Coronary artery bypass graft  11/2001    [facility]  . Cardiac valve replacement  11/2001    AVR and MVR-St. Jude devices  . Cataract extraction w/phaco  01/20/2011    Procedure: CATARACT EXTRACTION PHACO AND INTRAOCULAR LENS PLACEMENT (IOC);  Surgeon: Elta Guadeloupe T. Gershon Crane;  Location: [facility];  Service: Ophthalmology;  Laterality: Right;  CDE: 8.51  . Cataract extraction w/phaco  02/03/2011    Procedure: CATARACT EXTRACTION PHACO AND INTRAOCULAR LENS PLACEMENT (IOC);  Surgeon: Elta Guadeloupe T. Gershon Crane;  Location: [facility];  Service: Ophthalmology;  Laterality: Left;  CDE:10.01  . Colonoscopy  04/05/02; 08/2011    friable anal canal hemorrhoids otherwise normal; 2 diminutive polyps excised, minimal diverticulosis noted  . Esophagogastroduodenoscopy  01/2002    Dr. Laural Golden, submucosal esophageal lesion c/w leiomyoma  . Colonoscopy  09/02/2011    Procedure: COLONOSCOPY;  Surgeon: Daneil Dolin, MD;  Location: [facility];  Service: Endoscopy;  Laterality: N/A;  8:15  .  Yag laser application Right 67/89/3810    Procedure: YAG LASER APPLICATION;  Surgeon: Elta Guadeloupe T. Gershon Crane, MD;  Location: [facility];  Service: Ophthalmology;  Laterality: Right;   Family History  Problem Relation Age of Onset  . Hypotension Neg Hx   . Anesthesia problems Neg Hx   . Malignant hyperthermia Neg Hx   . Pseudochol deficiency Neg Hx   . Colon cancer Neg Hx   . Liver disease Neg Hx    History  Substance Use Topics  . Smoking status: Former Smoker -- 0.25 packs/day for 52 years    Types: Cigarettes    Quit date: 01/03/2014  . Smokeless tobacco: Former Systems developer    Quit date: 12/04/2013     Comment: STATES HE IS CUTTING DOWN now only  1/2 ppd (01/27/2013)  . Alcohol Use: 4.2 oz/week    6 Cans of beer, 1 Shots of liquor per week     Comment: Daily    Review of Systems  HENT: Positive for nosebleeds.    10 Systems reviewed and are negative for acute change except as noted in the HPI.    Allergies  Review of patient's allergies indicates no known allergies.  Home Medications   Prior to Admission medications   Medication Sig Start Date End Date Taking? Authorizing Provider  albuterol (PROVENTIL) (2.5 MG/3ML) 0.083% nebulizer solution Take 3 mLs (2.5 mg total) by nebulization every 2 (two) hours as needed for wheezing. 12/06/13  Yes Alonza Bogus, MD  amLODipine (NORVASC) 10 MG tablet Take 10 mg by mouth daily.  12/15/13  Yes Historical Provider, MD  atorvastatin (LIPITOR) 40 MG tablet Take 40 mg by mouth daily. This Replace Zocor(Simvastatin)   Yes Historical Provider, MD  clopidogrel (PLAVIX) 75 MG tablet Take 1 tablet (75 mg total) by mouth daily with breakfast. 07/03/13  Yes Satira Sark, MD  ferrous sulfate 325 (65 FE) MG EC tablet Take 325 mg by mouth daily with breakfast.   Yes Historical Provider, MD  folic acid (FOLVITE) 1 MG tablet Take 1 tablet (1 mg total) by mouth daily. 01/22/13  Yes Brooke O Edmisten, PA-C  levalbuterol (XOPENEX) 0.63 MG/3ML nebulizer solution Take 3 mLs (0.63 mg total) by nebulization every 6 (six) hours as needed for wheezing or shortness of breath. 01/22/13  Yes Brooke O Edmisten, PA-C  magnesium oxide (MAG-OX) 400 (241.3 MG) MG tablet Take 1 tablet (400 mg total) by mouth daily. 09/12/13  Yes Lendon Colonel, NP  metolazone (ZAROXOLYN) 2.5 MG tablet Take 2.5 mg (1 tablet) every week as needed for swelling. 01/26/14  Yes Arnoldo Lenis, MD  metoprolol (LOPRESSOR) 100 MG tablet Take 1 tablet (100 mg total) by mouth 2 (two) times daily. 07/03/13  Yes Satira Sark, MD  Multiple Vitamin (MULTIVITAMIN WITH MINERALS) TABS Take 1 tablet by mouth daily. 01/22/13  Yes Brooke O Edmisten,  PA-C  pantoprazole (PROTONIX) 40 MG tablet Take 40 mg by mouth daily.   Yes Historical Provider, MD  potassium chloride SA (K-DUR,KLOR-CON) 20 MEQ tablet Take 1 tablet (20 mEq total) by mouth daily. 12/18/13  Yes Arnoldo Lenis, MD  thiamine 100 MG tablet Take 1 tablet (100 mg total) by mouth daily. 01/22/13  Yes Brooke O Edmisten, PA-C  torsemide (DEMADEX) 20 MG tablet Take 3 tablets (60 mg total) by mouth 2 (two) times daily. 01/26/14  Yes Arnoldo Lenis, MD  warfarin (COUMADIN) 5 MG tablet Take 5-7.5 mg by mouth daily. Takes 7.5mg  on Mondays & 5mg   all other days of week   Yes Historical Provider, MD  acetaminophen (TYLENOL) 500 MG tablet Take 1,000 mg by mouth daily as needed for mild pain or headache.    Historical Provider, MD  loperamide (IMODIUM) 2 MG capsule Take 2 capsules (4 mg total) by mouth every 12 (twelve) hours as needed for diarrhea or loose stools. 12/06/13   Alonza Bogus, MD  nitroGLYCERIN (NITROSTAT) 0.4 MG SL tablet Place 1 tablet (0.4 mg total) under the tongue every 5 (five) minutes x 3 doses as needed for chest pain. 01/31/13   Rogelia Mire, NP   Triage Vitals: BP 136/45  Pulse 139  Temp(Src) 98.2 F (36.8 C) (Oral)  Resp 20  SpO2 80% Physical Exam  Nursing note and vitals reviewed. Constitutional:  Awake, alert, nontoxic appearance.  HENT:  Head: Atraumatic.  Rapid rhino in place with only minimal couple drops of blood. Added 1 mL of air to each balloon.   Eyes: Right eye exhibits no discharge. Left eye exhibits no discharge.  Neck: Neck supple.  Cardiovascular:  Patient is tachycardic. Irregular heartbeat with clicking.   Pulmonary/Chest: Effort normal and breath sounds normal. No respiratory distress. He has no wheezes. He has no rales. He exhibits no tenderness.  Hypoxic - 84% pulse-ox on 2L/min oxygen.     Abdominal: Soft. There is no tenderness. There is no rebound.  Musculoskeletal: He exhibits no tenderness.  Baseline ROM, no obvious new  focal weakness.  Neurological:  Mental status and motor strength appears baseline for patient and situation.  Skin: No rash noted.  Psychiatric: He has a normal mood and affect.    ED Course  Procedures (including critical care time) DIAGNOSTIC STUDIES: Oxygen saturation 80% hypoxic on 2 L nasal cannula oxygen which was barely entering his left naris; when single and nasal cannula oxygen port inserted Foley into his left naris and oxygen flow increased to 4 L his pulse oximetry improved to 88-90% although transiently would decrease again to mid 80s so a 50% Ventimask placed with improvement of oxygen saturation greater than 95%.   COORDINATION OF CARE: 6:06 PM-Discussed treatment plan with pt at bedside and pt agreed to plan.   Time out taken.  Patient had recurrence trickle of blood as well as feeling some blood down the back of his throat with his existing Rapid Rhino despite 1 ML increase air in both ports, therefore Rapid Rhino removed and replaced with new 7.5cm Rapid Rhino patient tolerated procedure well with no apparent immediate complications with bleeding improved but not resolved with slight trickle of blood continuing. 5.5 cm Rapid Rhino inserted into the left naris with complete resolution of bleeding from right naris with no further bleeding in the emergency department. Since patient has both naris packed and requires home oxygen with COPD with hypoxia patient placed on Ventimask oxygen in the emergency department and will be placed in observation overnight for potential removal of packing from the left naris tomorrow. Pulse oximetry in the 90s with Ventimask oxygen. D/w [facility] for Obs. 2020  Labs Review Labs Reviewed  PROTIME-INR - Abnormal; Notable for the following:    Prothrombin Time 28.3 (*)    INR 2.65 (*)    All other components within normal limits  CBC WITH DIFFERENTIAL - Abnormal; Notable for the following:    RBC 3.69 (*)    Hemoglobin 10.4 (*)    HCT 32.7 (*)     RDW 16.8 (*)    Neutrophils Relative % 81 (*)  Lymphocytes Relative 4 (*)    Lymphs Abs 0.3 (*)    All other components within normal limits  BASIC METABOLIC PANEL - Abnormal; Notable for the following:    Sodium 133 (*)    Chloride 89 (*)    Glucose, Bld 108 (*)    BUN 45 (*)    GFR calc non Af Amer 53 (*)    GFR calc Af Amer 62 (*)    All other components within normal limits  CBC - Abnormal; Notable for the following:    RBC 3.73 (*)    Hemoglobin 10.3 (*)    HCT 33.7 (*)    RDW 16.9 (*)    All other components within normal limits  COMPREHENSIVE METABOLIC PANEL - Abnormal; Notable for the following:    Sodium 135 (*)    Chloride 91 (*)    Glucose, Bld 110 (*)    BUN 40 (*)    Alkaline Phosphatase 125 (*)    GFR calc non Af Amer 58 (*)    GFR calc Af Amer 67 (*)    All other components within normal limits  PROTIME-INR - Abnormal; Notable for the following:    Prothrombin Time 26.6 (*)    INR 2.45 (*)    All other components within normal limits    Imaging Review No results found.   EKG Interpretation None      MDM   Final diagnoses:  Epistaxis, recurrent  Hypoxia  Anemia, unspecified anemia type  Chronic anticoagulation  Chronic obstructive pulmonary disease, unspecified COPD, unspecified chronic bronchitis type    The patient appears reasonably stabilized for admission considering the current resources, flow, and capabilities available in the ED at this time, and I doubt any other [facility] requiring further screening and/or treatment in the ED prior to admission. I personally performed the services described in this documentation, which was scribed in my presence. The recorded information has been reviewed and is accurate.   Babette Relic, MD 02/05/14 [phone]

## 2014-02-04 NOTE — ED Notes (Signed)
Pt c/o right nare bleeding and feeling blood in back of throat. MD notified and at bedside. Right side packing removed and replaced with 7.5 rhino rocket. Venturi mask reapplied. nad noted.

## 2014-02-04 NOTE — ED Notes (Signed)
Patient reports nosebleed to right nare that started approximately 2 hours ago. States also feels short of breath at this time.

## 2014-02-04 NOTE — ED Notes (Addendum)
Pt returns to er for nosebleed, was seen in er during the night, had nose "packed", started bleeding again 30 minutes ago, pt uses oxygen at home on prn basis, did not wear any oxygen to er,

## 2014-02-04 NOTE — H&P (Addendum)
PCP:   Alonza Bogus, MD   Chief Complaint:  Recurrent nosebleed  HPI: 68 year old male who   has a past medical history of Essential hypertension, benign; Coronary atherosclerosis of native coronary artery; Epistaxis; GERD (gastroesophageal reflux disease); Hyperlipemia; Abnormal LFTs; Chronic anticoagulation; Valvular heart disease; Tobacco abuse; Fasting hyperglycemia; Nephrolithiasis; Diverticulosis; Hyponatremia; Chronic diastolic CHF (congestive heart failure); Chronic renal disease; Hemolytic anemia; ETOH abuse; Atrial fibrillation (01/16/2013); and COPD (chronic obstructive pulmonary disease). Today came to the ED with recurrent nosebleed. Patient visited ED on Friday and had right-sided nosebleed which was packed and sent home, patient started having right-sided nosebleed again on Saturday which was again packed and sent home. But it did not help and again started having nosebleeds today show again the right nostril was packed by the ED physician. He also packed the left nostril to increase the pressure on right side to prevent recurrent nosebleeds. Patient is on home oxygen on nasal cannula at 2 L per minute for COPD, and will not be able to use nasal cannula because of the nasal packing in both nostrils. Ventimask has been placed. Patient denies any other symptoms no chest pain no shortness of breath no nausea vomiting or diarrhea. No blood in the stools no blood in the urine. INR in the hospital is 2.93 patient has mechanical mitral and aortic valve and would require and are the range of 2.5-3.5.  Allergies:  No Known Allergies    Past Medical History  Diagnosis Date  . Essential hypertension, benign   . Coronary atherosclerosis of native coronary artery     a. CABG/MVR/AVR-2003 (#25 St. Jude/#21 St. Jude); anticoagulation; b. negative stress nuclear-2005;  c. 01/2013 NSTEMI/Cath/PCI: LM nl, LAD 50p, 40-42m, D1 50ost, LCX 87m, RCA 50/91m (2.5x20 Promus DES), 50d, VG->Diag 100,  VG->PDA 100, VG->OM 100, LIMA->LAD ok.  . Epistaxis     Requiring cautery & aterial ligation-09/2009  . GERD (gastroesophageal reflux disease)   . Hyperlipemia   . Abnormal LFTs     Possible cirrhosis  . Chronic anticoagulation   . Valvular heart disease     a. 2003: MVR/AVR-2003 (#25 St. Jude/#21 St. Jude);  b. 01/2013 Echo: EF 55%, Mech AVR mean grad 12, Mech MVR mean grad 6.  . Tobacco abuse     45 pack years  . Fasting hyperglycemia   . Nephrolithiasis   . Diverticulosis   . Hyponatremia   . Chronic diastolic CHF (congestive heart failure)   . Chronic renal disease   . Hemolytic anemia   . ETOH abuse   . Atrial fibrillation 01/16/2013    On coumadin DCCV 07/2013.   Marland Kitchen COPD (chronic obstructive pulmonary disease)     Past Surgical History  Procedure Laterality Date  . Endocopic sphenopalatine artery ligation & cautry    . Inguinal hernia repair      Left & right  . Coronary artery bypass graft  11/2001    [facility]  . Cardiac valve replacement  11/2001    AVR and MVR-St. Jude devices  . Cataract extraction w/phaco  01/20/2011    Procedure: CATARACT EXTRACTION PHACO AND INTRAOCULAR LENS PLACEMENT (IOC);  Surgeon: Elta Guadeloupe T. Gershon Crane;  Location: [facility];  Service: Ophthalmology;  Laterality: Right;  CDE: 8.51  . Cataract extraction w/phaco  02/03/2011    Procedure: CATARACT EXTRACTION PHACO AND INTRAOCULAR LENS PLACEMENT (IOC);  Surgeon: Elta Guadeloupe T. Gershon Crane;  Location: [facility];  Service: Ophthalmology;  Laterality: Left;  CDE:10.01  . Colonoscopy  04/05/02; 08/2011  friable anal canal hemorrhoids otherwise normal; 2 diminutive polyps excised, minimal diverticulosis noted  . Esophagogastroduodenoscopy  01/2002    Dr. Laural Golden, submucosal esophageal lesion c/w leiomyoma  . Colonoscopy  09/02/2011    Procedure: COLONOSCOPY;  Surgeon: Daneil Dolin, MD;  Location: [facility];  Service: Endoscopy;  Laterality: N/A;  8:15  . Yag laser application Right 42/35/3614    Procedure: YAG  LASER APPLICATION;  Surgeon: Elta Guadeloupe T. Gershon Crane, MD;  Location: [facility];  Service: Ophthalmology;  Laterality: Right;    Prior to Admission medications   Medication Sig Start Date End Date Taking? Authorizing Provider  albuterol (PROVENTIL) (2.5 MG/3ML) 0.083% nebulizer solution Take 3 mLs (2.5 mg total) by nebulization every 2 (two) hours as needed for wheezing. 12/06/13  Yes Alonza Bogus, MD  amLODipine (NORVASC) 10 MG tablet Take 10 mg by mouth daily.  12/15/13  Yes Historical Provider, MD  atorvastatin (LIPITOR) 40 MG tablet Take 40 mg by mouth daily. This Replace Zocor(Simvastatin)   Yes Historical Provider, MD  clopidogrel (PLAVIX) 75 MG tablet Take 1 tablet (75 mg total) by mouth daily with breakfast. 07/03/13  Yes Satira Sark, MD  ferrous sulfate 325 (65 FE) MG EC tablet Take 325 mg by mouth daily with breakfast.   Yes Historical Provider, MD  folic acid (FOLVITE) 1 MG tablet Take 1 tablet (1 mg total) by mouth daily. 01/22/13  Yes Brooke O Edmisten, PA-C  levalbuterol (XOPENEX) 0.63 MG/3ML nebulizer solution Take 3 mLs (0.63 mg total) by nebulization every 6 (six) hours as needed for wheezing or shortness of breath. 01/22/13  Yes Brooke O Edmisten, PA-C  magnesium oxide (MAG-OX) 400 (241.3 MG) MG tablet Take 1 tablet (400 mg total) by mouth daily. 09/12/13  Yes Lendon Colonel, NP  metolazone (ZAROXOLYN) 2.5 MG tablet Take 2.5 mg (1 tablet) every week as needed for swelling. 01/26/14  Yes Arnoldo Lenis, MD  metoprolol (LOPRESSOR) 100 MG tablet Take 1 tablet (100 mg total) by mouth 2 (two) times daily. 07/03/13  Yes Satira Sark, MD  Multiple Vitamin (MULTIVITAMIN WITH MINERALS) TABS Take 1 tablet by mouth daily. 01/22/13  Yes Brooke O Edmisten, PA-C  pantoprazole (PROTONIX) 40 MG tablet Take 40 mg by mouth daily.   Yes Historical Provider, MD  potassium chloride SA (K-DUR,KLOR-CON) 20 MEQ tablet Take 1 tablet (20 mEq total) by mouth daily. 12/18/13  Yes Arnoldo Lenis, MD    thiamine 100 MG tablet Take 1 tablet (100 mg total) by mouth daily. 01/22/13  Yes Brooke O Edmisten, PA-C  torsemide (DEMADEX) 20 MG tablet Take 3 tablets (60 mg total) by mouth 2 (two) times daily. 01/26/14  Yes Arnoldo Lenis, MD  warfarin (COUMADIN) 5 MG tablet Take 5-7.5 mg by mouth daily. Takes 7.5mg  on Mondays & 5mg  all other days of week   Yes Historical Provider, MD  acetaminophen (TYLENOL) 500 MG tablet Take 1,000 mg by mouth daily as needed for mild pain or headache.    Historical Provider, MD  loperamide (IMODIUM) 2 MG capsule Take 2 capsules (4 mg total) by mouth every 12 (twelve) hours as needed for diarrhea or loose stools. 12/06/13   Alonza Bogus, MD  nitroGLYCERIN (NITROSTAT) 0.4 MG SL tablet Place 1 tablet (0.4 mg total) under the tongue every 5 (five) minutes x 3 doses as needed for chest pain. 01/31/13   Rogelia Mire, NP    Social History:  reports that he quit smoking about 4 weeks  ago. His smoking use included Cigarettes. He has a 13 pack-year smoking history. He uses smokeless tobacco. He reports that he drinks about 4.2 ounces of alcohol per week. He reports that he does not use illicit drugs.  Family History  Problem Relation Age of Onset  . Hypotension Neg Hx   . Anesthesia problems Neg Hx   . Malignant hyperthermia Neg Hx   . Pseudochol deficiency Neg Hx   . Colon cancer Neg Hx   . Liver disease Neg Hx      All the positives are listed in BOLD  Review of Systems:  HEENT: Headache, blurred vision, runny nose, sore throat Neck: Hypothyroidism, hyperthyroidism,,lymphadenopathy Chest : Shortness of breath, history of COPD, Asthma Heart : Chest pain, history of coronary arterey disease GI:  Nausea, vomiting, diarrhea, constipation, GERD GU: Dysuria, urgency, frequency of urination, hematuria Neuro: Stroke, seizures, syncope Psych: Depression, anxiety, hallucinations   Physical Exam: Blood pressure 132/69, pulse 77, temperature 98.2 F (36.8 C),  temperature source Oral, resp. rate 20, SpO2 97.00%. Constitutional:   Patient is a well-developed and well-nourished male* in no acute distress and cooperative with exam. Head: Normocephalic and atraumatic Mouth: Mucus membranes moist Eyes: PERRL, EOMI, conjunctivae normal Neck: Supple, No Thyromegaly Cardiovascular: RRR, S1 normal, S2 normal Pulmonary/Chest: CTAB, no wheezes, rales, or rhonchi Abdominal: Soft. Non-tender, non-distended, bowel sounds are normal, no masses, organomegaly, or guarding present.  Neurological: A&O x3, Strenght is normal and symmetric bilaterally, cranial nerve II-XII are grossly intact, no focal motor deficit, sensory intact to light touch bilaterally.  Extremities : No Cyanosis, Clubbing or Edema  Labs on Admission:  Basic Metabolic Panel:  Recent Labs Lab 01/30/14 0808 02/04/14 1903  NA 131* 133*  K 5.0 3.8  CL 89* 89*  CO2 31 31  GLUCOSE 98 108*  BUN 37* 45*  CREATININE 1.47* 1.33  CALCIUM 8.9 9.4  MG 2.0  --    Liver Function Tests: No results found for this basename: AST, ALT, ALKPHOS, BILITOT, PROT, ALBUMIN,  in the last 168 hours No results found for this basename: LIPASE, AMYLASE,  in the last 168 hours No results found for this basename: AMMONIA,  in the last 168 hours CBC:  Recent Labs Lab 02/02/14 0829 02/04/14 1903  WBC 6.7 8.1  NEUTROABS 5.3 6.6  HGB 10.4* 10.4*  HCT 32.2* 32.7*  MCV 88.7 88.6  PLT 289 258   Cardiac Enzymes: No results found for this basename: CKTOTAL, CKMB, CKMBINDEX, TROPONINI,  in the last 168 hours  BNP (last 3 results)  Recent Labs  07/23/13 1008 08/14/13 1637 11/26/13 1826  PROBNP 8081.0* 5977.0* 12192.0*   CBG: No results found for this basename: GLUCAP,  in the last 168 hours  Radiological Exams on Admission: No results found.      Assessment/Plan Principal Problem:   Epistaxis, recurrent Active Problems:   MITRAL VALVE REPLACEMENT, HX OF   AORTIC VALVE REPLACEMENT, HX OF    Chronic anticoagulation   Epistaxis  Epistaxis recurrent Patient has nasal packing in both the nostrils, if no improvement he will need an ENT evaluation.  Chronic anticoagulation Patient is on chronic anticoagulation with Coumadin due to mechanical aortic and mitral valve We'll consult pharmacy to manage the dose of Coumadin.  Coronary artery disease Stable, continue her medications  COPD Stable Continue when necessary albuterol nebulizer Continue oxygen via the Ventimask.  Code status: Patient is full code  Family discussion: Admission, patients condition and plan of care including tests being ordered have  been discussed with the patient and *his wife at bedside* who indicate understanding and agree with the plan and Code Status.   Time Spent on Admission: 60 min  [facility] Pager: [phone] 02/04/2014, 9:34 PM  If 7PM-7AM, please contact night-coverage  [email or web address]  Password [email or web address]

## 2014-02-04 NOTE — Discharge Instructions (Signed)
Nosebleed °A nosebleed can be caused by many things, including: °· Getting hit hard in the nose. °· Infections. °· Dry nose. °· Colds. °· Medicines. °Your doctor may do lab testing if you get nosebleeds a lot and the cause is not known. °HOME CARE  °· If your nose was packed with material, keep it there until your doctor takes it out. Put the pack back in your nose if the pack falls out. °· Do not blow your nose for 12 hours after the nosebleed. °· Sit up and bend forward if your nose starts bleeding again. Pinch the front half of your nose nonstop for 20 minutes. °· Put petroleum jelly inside your nose every morning if you have a dry nose. °· Use a humidifier to make the air less dry. °· Do not take aspirin. °· Try not to strain, lift, or bend at the waist for many days after the nosebleed. °GET HELP RIGHT AWAY IF:  °· Nosebleeds keep happening and are hard to stop or control. °· You have bleeding or bruises that are not normal on other parts of the body. °· You have a fever. °· The nosebleeds get worse. °· You get lightheaded, feel faint, sweaty, or throw up (vomit) blood. °MAKE SURE YOU:  °· Understand these instructions. °· Will watch your condition. °· Will get help right away if you are not doing well or get worse. °Document Released: 03/31/2008 Document Revised: 09/14/2011 Document Reviewed: 03/31/2008 °Exit[employer]® Patient Information ©2015 [employer]. This information is not intended to replace advice given to you by your health care provider. Make sure you discuss any questions you have with your health care provider. ° °

## 2014-02-05 LAB — COMPREHENSIVE METABOLIC PANEL
ALBUMIN: 3.8 g/dL (ref 3.5–5.2)
ALT: 10 U/L (ref 0–53)
AST: 28 U/L (ref 0–37)
Alkaline Phosphatase: 125 U/L — ABNORMAL HIGH (ref 39–117)
Anion gap: 12 (ref 5–15)
BUN: 40 mg/dL — ABNORMAL HIGH (ref 6–23)
CALCIUM: 9.4 mg/dL (ref 8.4–10.5)
CO2: 32 meq/L (ref 19–32)
Chloride: 91 mEq/L — ABNORMAL LOW (ref 96–112)
Creatinine, Ser: 1.24 mg/dL (ref 0.50–1.35)
GFR calc Af Amer: 67 mL/min — ABNORMAL LOW (ref >90)
GFR, EST NON AFRICAN AMERICAN: 58 mL/min — AB (ref >90)
Glucose, Bld: 110 mg/dL — ABNORMAL HIGH (ref 70–99)
Potassium: 3.9 mEq/L (ref 3.7–5.3)
SODIUM: 135 meq/L — AB (ref 137–147)
TOTAL PROTEIN: 8 g/dL (ref 6.0–8.3)
Total Bilirubin: 0.9 mg/dL (ref 0.3–1.2)

## 2014-02-05 LAB — CBC
HCT: 33.7 % — ABNORMAL LOW (ref 39.0–52.0)
Hemoglobin: 10.3 g/dL — ABNORMAL LOW (ref 13.0–17.0)
MCH: 27.6 pg (ref 26.0–34.0)
MCHC: 30.6 g/dL (ref 30.0–36.0)
MCV: 90.3 fL (ref 78.0–100.0)
PLATELETS: 286 10*3/uL (ref 150–400)
RBC: 3.73 MIL/uL — AB (ref 4.22–5.81)
RDW: 16.9 % — ABNORMAL HIGH (ref 11.5–15.5)
WBC: 8.8 10*3/uL (ref 4.0–10.5)

## 2014-02-05 LAB — PROTIME-INR
INR: 2.45 — AB (ref 0.00–1.49)
Prothrombin Time: 26.6 seconds — ABNORMAL HIGH (ref 11.6–15.2)

## 2014-02-05 MED ORDER — CETYLPYRIDINIUM CHLORIDE 0.05 % MT LIQD
7.0000 mL | Freq: Two times a day (BID) | OROMUCOSAL | Status: DC
  Administered 2014-02-05 – 2014-02-06 (×3): 7 mL via OROMUCOSAL

## 2014-02-05 MED ORDER — LORAZEPAM 1 MG PO TABS: 0.0000 mg | ORAL_TABLET | Freq: Two times a day (BID) | ORAL | Status: DC

## 2014-02-05 MED ORDER — LORAZEPAM 2 MG/ML IJ SOLN
1.0000 mg | Freq: Four times a day (QID) | INTRAMUSCULAR | Status: DC | PRN
  Administered 2014-02-06 (×2): 1 mg via INTRAVENOUS
  Filled 2014-02-05: qty 1

## 2014-02-05 MED ORDER — LORAZEPAM 1 MG PO TABS
0.0000 mg | ORAL_TABLET | Freq: Four times a day (QID) | ORAL | Status: AC
  Administered 2014-02-05: 1 mg via ORAL
  Filled 2014-02-05 (×4): qty 1

## 2014-02-05 MED ORDER — LORAZEPAM 0.5 MG PO TABS
1.0000 mg | ORAL_TABLET | Freq: Four times a day (QID) | ORAL | Status: DC | PRN
  Administered 2014-02-05 – 2014-02-06 (×3): 1 mg via ORAL
  Filled 2014-02-05: qty 1

## 2014-02-05 MED ORDER — WARFARIN SODIUM 7.5 MG PO TABS
7.5000 mg | ORAL_TABLET | Freq: Once | ORAL | Status: AC
  Administered 2014-02-05: 7.5 mg via ORAL
  Filled 2014-02-05: qty 1

## 2014-02-05 NOTE — Progress Notes (Signed)
ANTICOAGULATION CONSULT NOTE - Initial Consult  Pharmacy Consult for Warfarin Indication: MVR/AVR  No Known Allergies  Patient Measurements: Height: 5\' 6"  (167.6 cm) Weight: 153 lb (69.4 kg) IBW/kg (Calculated) : 63.8   Vital Signs: Temp: 97.8 F (36.6 C) (08/03 0612) Temp src: Oral (08/03 0612) BP: 141/75 mmHg (08/03 0612) Pulse Rate: 88 (08/03 0612)  Labs:  Recent Labs  02/04/14 1903 02/05/14 0557  HGB 10.4* 10.3*  HCT 32.7* 33.7*  PLT 258 286  LABPROT 28.3* 26.6*  INR 2.65* 2.45*  CREATININE 1.33 1.24    Estimated Creatinine Clearance: 51.5 ml/min (by C-G formula based on Cr of 1.24).   Medical History: Past Medical History  Diagnosis Date  . Essential hypertension, benign   . Coronary atherosclerosis of native coronary artery     a. CABG/MVR/AVR-2003 (#25 St. Jude/#21 St. Jude); anticoagulation; b. negative stress nuclear-2005;  c. 01/2013 NSTEMI/Cath/PCI: LM nl, LAD 50p, 40-28m, D1 50ost, LCX 65m, RCA 50/83m (2.5x20 Promus DES), 50d, VG->Diag 100, VG->PDA 100, VG->OM 100, LIMA->LAD ok.  . Epistaxis     Requiring cautery & aterial ligation-09/2009  . GERD (gastroesophageal reflux disease)   . Hyperlipemia   . Abnormal LFTs     Possible cirrhosis  . Chronic anticoagulation   . Valvular heart disease     a. 2003: MVR/AVR-2003 (#25 St. Jude/#21 St. Jude);  b. 01/2013 Echo: EF 55%, Mech AVR mean grad 12, Mech MVR mean grad 6.  . Tobacco abuse     45 pack years  . Fasting hyperglycemia   . Nephrolithiasis   . Diverticulosis   . Hyponatremia   . Chronic diastolic CHF (congestive heart failure)   . Chronic renal disease   . Hemolytic anemia   . ETOH abuse   . Atrial fibrillation 01/16/2013    On coumadin DCCV 07/2013.   Marland Kitchen COPD (chronic obstructive pulmonary disease)     Medications:  Prescriptions prior to admission  Medication Sig Dispense Refill  . albuterol (PROVENTIL) (2.5 MG/3ML) 0.083% nebulizer solution Take 3 mLs (2.5 mg total) by nebulization  every 2 (two) hours as needed for wheezing.  75 mL  12  . amLODipine (NORVASC) 10 MG tablet Take 10 mg by mouth daily.       Marland Kitchen atorvastatin (LIPITOR) 40 MG tablet Take 40 mg by mouth daily. This Replace Zocor(Simvastatin)      . clopidogrel (PLAVIX) 75 MG tablet Take 1 tablet (75 mg total) by mouth daily with breakfast.  30 tablet  6  . ferrous sulfate 325 (65 FE) MG EC tablet Take 325 mg by mouth daily with breakfast.      . folic acid (FOLVITE) 1 MG tablet Take 1 tablet (1 mg total) by mouth daily.  30 tablet  4  . levalbuterol (XOPENEX) 0.63 MG/3ML nebulizer solution Take 3 mLs (0.63 mg total) by nebulization every 6 (six) hours as needed for wheezing or shortness of breath.  3 mL  4  . magnesium oxide (MAG-OX) 400 (241.3 MG) MG tablet Take 1 tablet (400 mg total) by mouth daily.  30 tablet  6  . metolazone (ZAROXOLYN) 2.5 MG tablet Take 2.5 mg (1 tablet) every week as needed for swelling.  12 tablet  3  . metoprolol (LOPRESSOR) 100 MG tablet Take 1 tablet (100 mg total) by mouth 2 (two) times daily.  60 tablet  6  . Multiple Vitamin (MULTIVITAMIN WITH MINERALS) TABS Take 1 tablet by mouth daily.      . pantoprazole (PROTONIX) 40 MG  tablet Take 40 mg by mouth daily.      . potassium chloride SA (K-DUR,KLOR-CON) 20 MEQ tablet Take 1 tablet (20 mEq total) by mouth daily.  90 tablet  3  . thiamine 100 MG tablet Take 1 tablet (100 mg total) by mouth daily.  30 tablet  4  . torsemide (DEMADEX) 20 MG tablet Take 3 tablets (60 mg total) by mouth 2 (two) times daily.  180 tablet  6  . warfarin (COUMADIN) 5 MG tablet Take 5-7.5 mg by mouth daily. Takes 7.5mg  on Mondays & 5mg  all other days of week      . acetaminophen (TYLENOL) 500 MG tablet Take 1,000 mg by mouth daily as needed for mild pain or headache.      . loperamide (IMODIUM) 2 MG capsule Take 2 capsules (4 mg total) by mouth every 12 (twelve) hours as needed for diarrhea or loose stools.  30 capsule  0  . nitroGLYCERIN (NITROSTAT) 0.4 MG SL  tablet Place 1 tablet (0.4 mg total) under the tongue every 5 (five) minutes x 3 doses as needed for chest pain.  25 tablet  3    Assessment: Okay for Protocol, recent bleeding epistaxis noted. INR at goal range, last dose 8/1.  Spoke w/ Dr Luan Pulling, ok to give warfarin this PM with understanding this may change after he speaks with ENT for finalized plan.   Goal of Therapy:  INR 2.5- 3.5   Plan:  Warfarin 7.5mg  PO x 1 today per home regimen. Daily PT/INR.  Biagio Quint R 02/05/2014,10:21 AM

## 2014-02-05 NOTE — Progress Notes (Signed)
Subjective: He was admitted with recurrent epistaxis. Because of the problems with his nose he was not receiving his oxygen well from his nasal cannula. He is now on mask oxygen. He says his nosebleed has stopped. 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 He says the device in his left naris migrated out during the night  Objective: Vital signs in last 24 hours: Temp:  [97.6 F (36.4 C)-98.2 F (36.8 C)] 97.8 F (36.6 C) (08/03 0612) Pulse Rate:  [77-139] 88 (08/03 0612) Resp:  [16-20] 20 (08/02 2043) BP: (132-167)/(45-91) 141/75 mmHg (08/03 0612) SpO2:  [80 %-99 %] 96 % (08/03 0612) FiO2 (%):  [50 %] 50 % (08/03 0612) Weight:  [69.4 kg (153 lb)] 69.4 kg (153 lb) (08/02 2142) Weight change:  Last BM Date: 02/04/14  Intake/Output from previous day:    PHYSICAL EXAM General appearance: alert, cooperative, mild distress and He is on mask oxygen. He has an occlusive device in his right nostril Resp: clear to auscultation bilaterally Cardio: irregularly irregular rhythm GI: soft, non-tender; bowel sounds normal; no masses,  no organomegaly Extremities: extremities normal, atraumatic, no cyanosis or edema  Lab Results:  Results for orders placed during the hospital encounter of 02/04/14 (from the past 48 hour(s))  PROTIME-INR     Status: Abnormal   Collection Time    02/04/14  7:03 PM      Result Value Ref Range   Prothrombin Time 28.3 (*) 11.6 - 15.2 seconds   INR 2.65 (*) 0.00 - 1.49  CBC WITH DIFFERENTIAL     Status: Abnormal   Collection Time    02/04/14  7:03 PM      Result Value Ref Range   WBC 8.1  4.0 - 10.5 K/uL   RBC 3.69 (*) 4.22 - 5.81 MIL/uL   Hemoglobin 10.4 (*) 13.0 - 17.0 g/dL   HCT 32.7 (*) 39.0 - 52.0 %   MCV 88.6  78.0 - 100.0 fL   MCH 28.2  26.0 - 34.0 pg   MCHC 31.8  30.0 - 36.0 g/dL   RDW 16.8 (*) 11.5 - 15.5 %   Platelets 258  150 - 400 K/uL   Neutrophils Relative % 81 (*) 43 - 77 %   Neutro Abs 6.6   1.7 - 7.7 K/uL   Lymphocytes Relative 4 (*) 12 - 46 %   Lymphs Abs 0.3 (*) 0.7 - 4.0 K/uL   Monocytes Relative 12  3 - 12 %   Monocytes Absolute 1.0  0.1 - 1.0 K/uL   Eosinophils Relative 2  0 - 5 %   Eosinophils Absolute 0.1  0.0 - 0.7 K/uL   Basophils Relative 1  0 - 1 %   Basophils Absolute 0.0  0.0 - 0.1 K/uL  BASIC METABOLIC PANEL     Status: Abnormal   Collection Time    02/04/14  7:03 PM      Result Value Ref Range   Sodium 133 (*) 137 - 147 mEq/L   Potassium 3.8  3.7 - 5.3 mEq/L   Chloride 89 (*) 96 - 112 mEq/L   CO2 31  19 - 32 mEq/L   Glucose, Bld 108 (*) 70 - 99 mg/dL   BUN 45 (*) 6 - 23 mg/dL   Creatinine, Ser 1.33  0.50 - 1.35 mg/dL   Calcium 9.4  8.4 - 10.5 mg/dL   GFR calc non Af Amer 53 (*) >90 mL/min   GFR calc Af Amer 62 (*) >  90 mL/min   Comment: (NOTE)     The eGFR has been calculated using the CKD EPI equation.     This calculation has not been validated in all clinical situations.     eGFR's persistently <90 mL/min signify possible Chronic Kidney     Disease.   Anion gap 13  5 - 15  CBC     Status: Abnormal   Collection Time    02/05/14  5:57 AM      Result Value Ref Range   WBC 8.8  4.0 - 10.5 K/uL   RBC 3.73 (*) 4.22 - 5.81 MIL/uL   Hemoglobin 10.3 (*) 13.0 - 17.0 g/dL   HCT 33.7 (*) 39.0 - 52.0 %   MCV 90.3  78.0 - 100.0 fL   MCH 27.6  26.0 - 34.0 pg   MCHC 30.6  30.0 - 36.0 g/dL   RDW 16.9 (*) 11.5 - 15.5 %   Platelets 286  150 - 400 K/uL  COMPREHENSIVE METABOLIC PANEL     Status: Abnormal   Collection Time    02/05/14  5:57 AM      Result Value Ref Range   Sodium 135 (*) 137 - 147 mEq/L   Potassium 3.9  3.7 - 5.3 mEq/L   Chloride 91 (*) 96 - 112 mEq/L   CO2 32  19 - 32 mEq/L   Glucose, Bld 110 (*) 70 - 99 mg/dL   BUN 40 (*) 6 - 23 mg/dL   Creatinine, Ser 1.24  0.50 - 1.35 mg/dL   Calcium 9.4  8.4 - 10.5 mg/dL   Total Protein 8.0  6.0 - 8.3 g/dL   Albumin 3.8  3.5 - 5.2 g/dL   AST 28  0 - 37 U/L   ALT 10  0 - 53 U/L   Alkaline  Phosphatase 125 (*) 39 - 117 U/L   Total Bilirubin 0.9  0.3 - 1.2 mg/dL   GFR calc non Af Amer 58 (*) >90 mL/min   GFR calc Af Amer 67 (*) >90 mL/min   Comment: (NOTE)     The eGFR has been calculated using the CKD EPI equation.     This calculation has not been validated in all clinical situations.     eGFR's persistently <90 mL/min signify possible Chronic Kidney     Disease.   Anion gap 12  5 - 15  PROTIME-INR     Status: Abnormal   Collection Time    02/05/14  5:57 AM      Result Value Ref Range   Prothrombin Time 26.6 (*) 11.6 - 15.2 seconds   INR 2.45 (*) 0.00 - 1.49    ABGS No results found for this basename: PHART, PCO2, PO2ART, TCO2, HCO3,  in the last 72 hours CULTURES No results found for this or any previous visit (from the past 240 hour(s)). Studies/Results: No results found.  Medications:  Prior to Admission:  Prescriptions prior to admission  Medication Sig Dispense Refill  . albuterol (PROVENTIL) (2.5 MG/3ML) 0.083% nebulizer solution Take 3 mLs (2.5 mg total) by nebulization every 2 (two) hours as needed for wheezing.  75 mL  12  . amLODipine (NORVASC) 10 MG tablet Take 10 mg by mouth daily.       Marland Kitchen atorvastatin (LIPITOR) 40 MG tablet Take 40 mg by mouth daily. This Replace Zocor(Simvastatin)      . clopidogrel (PLAVIX) 75 MG tablet Take 1 tablet (75 mg total) by mouth daily with breakfast.  30  tablet  6  . ferrous sulfate 325 (65 FE) MG EC tablet Take 325 mg by mouth daily with breakfast.      . folic acid (FOLVITE) 1 MG tablet Take 1 tablet (1 mg total) by mouth daily.  30 tablet  4  . levalbuterol (XOPENEX) 0.63 MG/3ML nebulizer solution Take 3 mLs (0.63 mg total) by nebulization every 6 (six) hours as needed for wheezing or shortness of breath.  3 mL  4  . magnesium oxide (MAG-OX) 400 (241.3 MG) MG tablet Take 1 tablet (400 mg total) by mouth daily.  30 tablet  6  . metolazone (ZAROXOLYN) 2.5 MG tablet Take 2.5 mg (1 tablet) every week as needed for swelling.   12 tablet  3  . metoprolol (LOPRESSOR) 100 MG tablet Take 1 tablet (100 mg total) by mouth 2 (two) times daily.  60 tablet  6  . Multiple Vitamin (MULTIVITAMIN WITH MINERALS) TABS Take 1 tablet by mouth daily.      . pantoprazole (PROTONIX) 40 MG tablet Take 40 mg by mouth daily.      . potassium chloride SA (K-DUR,KLOR-CON) 20 MEQ tablet Take 1 tablet (20 mEq total) by mouth daily.  90 tablet  3  . thiamine 100 MG tablet Take 1 tablet (100 mg total) by mouth daily.  30 tablet  4  . torsemide (DEMADEX) 20 MG tablet Take 3 tablets (60 mg total) by mouth 2 (two) times daily.  180 tablet  6  . warfarin (COUMADIN) 5 MG tablet Take 5-7.5 mg by mouth daily. Takes 7.$RemoveBefore'5mg'BRrlOMwIBdyQV$  on Mondays & $RemoveB'5mg'bEyVsXgh$  all other days of week      . acetaminophen (TYLENOL) 500 MG tablet Take 1,000 mg by mouth daily as needed for mild pain or headache.      . loperamide (IMODIUM) 2 MG capsule Take 2 capsules (4 mg total) by mouth every 12 (twelve) hours as needed for diarrhea or loose stools.  30 capsule  0  . nitroGLYCERIN (NITROSTAT) 0.4 MG SL tablet Place 1 tablet (0.4 mg total) under the tongue every 5 (five) minutes x 3 doses as needed for chest pain.  25 tablet  3   Scheduled: . amLODipine  10 mg Oral Daily  . antiseptic oral rinse  7 mL Mouth Rinse BID  . atorvastatin  40 mg Oral Daily  . clopidogrel  75 mg Oral Q breakfast  . LORazepam  0-4 mg Oral Q6H   Followed by  . [START ON 02/07/2014] LORazepam  0-4 mg Oral Q12H  . metoprolol  100 mg Oral BID  . pantoprazole  40 mg Oral Daily  . potassium chloride SA  20 mEq Oral Daily  . sodium chloride  3 mL Intravenous Q12H  . thiamine  100 mg Oral Daily  . torsemide  60 mg Oral BID  . Warfarin - Pharmacist Dosing Inpatient   Does not apply Q24H   Continuous:  BOF:BPZWCH chloride, acetaminophen, albuterol, loperamide, LORazepam, LORazepam, sodium chloride  Assesment: He has recurrent epistaxis. He is chronically anticoagulated because of replacement of his mitral and aortic  valves. He has multiple other medical problems including COPD which is pretty stable but he requires home oxygen. He has congestive heart failure but is asymptomatic at this point except for dyspnea on exertion. He is known to have coronary artery occlusive disease which is stable. Principal Problem:   Epistaxis, recurrent Active Problems:   MITRAL VALVE REPLACEMENT, HX OF   AORTIC VALVE REPLACEMENT, HX OF   Chronic anticoagulation  Epistaxis    Plan: I will discuss with his ENT physician regarding what we need to do as far as the device in his nose. I will see if we can get him back on nasal oxygen with reasonable oxygen saturation    LOS: 1 day   Khaya Theissen L 02/05/2014, 8:33 AM

## 2014-02-05 NOTE — Progress Notes (Signed)
UR chart review completed.  

## 2014-02-05 NOTE — Care Management Note (Addendum)
    Page 1 of 2   02/12/2014     10:56:34 AM CARE MANAGEMENT NOTE 02/12/2014  Patient:  [name], [name]   Account Number:  [id number]  Date Initiated:  02/05/2014  Documentation initiated by:  CHILDRESS,JESSICA  Subjective/Objective Assessment:   Patient from home with self care. Patient used cane and walker PRN at home. Patient has home O2 that he gets from [facility]. Patient reports he is independent with ADL's     Action/Plan:   Patient wishes to [facility] home with self care. No further CM needs identified at this time.   Anticipated DC Date:  02/05/2014   Anticipated DC Plan:  [place]  CM consult      [facility] Choice  HOME HEALTH   Choice offered to / List presented to:  C-1 Patient        [place] arranged  HH-1 RN  [place].   Status of service:  Completed, signed off Medicare Important Message given?  YES (If response is "NO", the following Medicare IM given date fields will be blank) Date Medicare IM given:  02/09/2014 Medicare IM given by:  Jolene Provost Date Additional Medicare IM given:  02/12/2014 Additional Medicare IM given by:  Theophilus Kinds  Discharge Disposition:  [place]  Per UR Regulation:    If discussed at Long Length of Stay Meetings, dates discussed:    Comments:  02/12/14 [place], RN BSN CM Pt discharged home today with [facility] RN and PT (per pts choice). Romualdo Bolk of [facility] is aware and will collect the pts information from the chart. [facility] services to start within 48 hours of discharge. No DME needs noted. Pt and pts nurse aware of discharge arrangements.  02/09/2014 0830 Jolene Provost, RN, MSN, PCCN Patient no longer extubated. Patient continuing to stabilize. Patient plans to discharge home with self care.  02/07/14 [place], RN BSN CM Pt currently intubated. Will continue to follow for discharge planning needs.  02/05/2014 [place], RN, MSN, [facility]

## 2014-02-06 ENCOUNTER — Inpatient Hospital Stay (HOSPITAL_COMMUNITY): Payer: Medicare Other

## 2014-02-06 ENCOUNTER — Telehealth: Payer: Self-pay

## 2014-02-06 LAB — BLOOD GAS, ARTERIAL
ACID-BASE EXCESS: 5.3 mmol/L — AB (ref 0.0–2.0)
BICARBONATE: 31.1 meq/L — AB (ref 20.0–24.0)
Drawn by: 21310
FIO2: 100 %
LHR: 15 {breaths}/min
MECHVT: 550 mL
O2 Saturation: 97.2 %
PCO2 ART: 62.8 mmHg — AB (ref 35.0–45.0)
PEEP/CPAP: 5 cmH2O
PO2 ART: 95.5 mmHg (ref 80.0–100.0)
Patient temperature: 37
TCO2: 29.7 mmol/L (ref 0–100)
pH, Arterial: 7.315 — ABNORMAL LOW (ref 7.350–7.450)

## 2014-02-06 LAB — BASIC METABOLIC PANEL
Anion gap: 11 (ref 5–15)
BUN: 40 mg/dL — AB (ref 6–23)
CALCIUM: 9.2 mg/dL (ref 8.4–10.5)
CO2: 34 meq/L — AB (ref 19–32)
CREATININE: 1.32 mg/dL (ref 0.50–1.35)
Chloride: 92 mEq/L — ABNORMAL LOW (ref 96–112)
GFR calc Af Amer: 62 mL/min — ABNORMAL LOW (ref >90)
GFR calc non Af Amer: 54 mL/min — ABNORMAL LOW (ref >90)
Glucose, Bld: 115 mg/dL — ABNORMAL HIGH (ref 70–99)
Potassium: 4.2 mEq/L (ref 3.7–5.3)
Sodium: 137 mEq/L (ref 137–147)

## 2014-02-06 LAB — PROTIME-INR
INR: 3.11 — ABNORMAL HIGH (ref 0.00–1.49)
PROTHROMBIN TIME: 32 s — AB (ref 11.6–15.2)

## 2014-02-06 LAB — CBC
HCT: 31.4 % — ABNORMAL LOW (ref 39.0–52.0)
Hemoglobin: 9.4 g/dL — ABNORMAL LOW (ref 13.0–17.0)
MCH: 28.1 pg (ref 26.0–34.0)
MCHC: 29.9 g/dL — ABNORMAL LOW (ref 30.0–36.0)
MCV: 93.7 fL (ref 78.0–100.0)
Platelets: 271 K/uL (ref 150–400)
RBC: 3.35 MIL/uL — ABNORMAL LOW (ref 4.22–5.81)
RDW: 17.3 % — ABNORMAL HIGH (ref 11.5–15.5)
WBC: 9.9 K/uL (ref 4.0–10.5)

## 2014-02-06 LAB — MRSA PCR SCREENING: MRSA by PCR: NEGATIVE

## 2014-02-06 LAB — PRO B NATRIURETIC PEPTIDE: PRO B NATRI PEPTIDE: 17258 pg/mL — AB (ref 0–125)

## 2014-02-06 MED ORDER — WARFARIN SODIUM 5 MG PO TABS
5.0000 mg | ORAL_TABLET | Freq: Once | ORAL | Status: AC
  Administered 2014-02-06: 5 mg via ORAL
  Filled 2014-02-06: qty 1

## 2014-02-06 MED ORDER — FUROSEMIDE 10 MG/ML IJ SOLN
80.0000 mg | Freq: Once | INTRAMUSCULAR | Status: AC
  Administered 2014-02-06: 80 mg via INTRAVENOUS
  Filled 2014-02-06: qty 8

## 2014-02-06 MED ORDER — ALBUTEROL SULFATE (2.5 MG/3ML) 0.083% IN NEBU
2.5000 mg | INHALATION_SOLUTION | Freq: Two times a day (BID) | RESPIRATORY_TRACT | Status: DC
  Administered 2014-02-06 – 2014-02-12 (×13): 2.5 mg via RESPIRATORY_TRACT
  Filled 2014-02-06 (×13): qty 3

## 2014-02-06 MED ORDER — DOCUSATE SODIUM 100 MG PO CAPS: 100.0000 mg | ORAL_CAPSULE | Freq: Two times a day (BID) | ORAL | Status: DC | PRN

## 2014-02-06 MED ORDER — FENTANYL CITRATE 0.05 MG/ML IJ SOLN
50.0000 ug | INTRAMUSCULAR | Status: DC | PRN
  Administered 2014-02-07 (×6): 50 ug via INTRAVENOUS
  Filled 2014-02-06 (×7): qty 2

## 2014-02-06 MED ORDER — FUROSEMIDE 10 MG/ML IJ SOLN
40.0000 mg | Freq: Two times a day (BID) | INTRAMUSCULAR | Status: DC
  Administered 2014-02-06: 40 mg via INTRAVENOUS

## 2014-02-06 MED ORDER — LIDOCAINE HCL (CARDIAC) 20 MG/ML IV SOLN
INTRAVENOUS | Status: AC
Start: 2014-02-06 — End: 2014-02-07
  Filled 2014-02-06: qty 5

## 2014-02-06 MED ORDER — LORAZEPAM 2 MG/ML IJ SOLN
1.0000 mg | Freq: Four times a day (QID) | INTRAMUSCULAR | Status: DC | PRN
  Administered 2014-02-07 – 2014-02-09 (×4): 1 mg via INTRAVENOUS
  Filled 2014-02-06 (×5): qty 1

## 2014-02-06 MED ORDER — NYSTATIN 100000 UNIT/GM EX POWD
Freq: Two times a day (BID) | CUTANEOUS | Status: DC
  Administered 2014-02-07 (×2): via TOPICAL
  Administered 2014-02-08: 1 g via TOPICAL
  Administered 2014-02-08 – 2014-02-11 (×6): via TOPICAL
  Filled 2014-02-06 (×3): qty 15

## 2014-02-06 MED ORDER — CETYLPYRIDINIUM CHLORIDE 0.05 % MT LIQD
7.0000 mL | Freq: Two times a day (BID) | OROMUCOSAL | Status: DC
Start: 2014-02-06 — End: 2014-02-10
  Administered 2014-02-06 – 2014-02-09 (×7): 7 mL via OROMUCOSAL

## 2014-02-06 MED ORDER — ETOMIDATE 2 MG/ML IV SOLN
INTRAVENOUS | Status: AC
  Administered 2014-02-06: 20 mg
  Filled 2014-02-06: qty 20

## 2014-02-06 MED ORDER — FUROSEMIDE 10 MG/ML IJ SOLN
40.0000 mg | Freq: Once | INTRAMUSCULAR | Status: AC
  Administered 2014-02-06: 40 mg via INTRAVENOUS
  Filled 2014-02-06: qty 4

## 2014-02-06 MED ORDER — ROCURONIUM BROMIDE 50 MG/5ML IV SOLN
INTRAVENOUS | Status: AC
  Filled 2014-02-06: qty 2

## 2014-02-06 MED ORDER — FENTANYL CITRATE 0.05 MG/ML IJ SOLN
50.0000 ug | INTRAMUSCULAR | Status: AC | PRN
  Administered 2014-02-06 – 2014-02-07 (×3): 50 ug via INTRAVENOUS
  Filled 2014-02-06 (×2): qty 2

## 2014-02-06 MED ORDER — MIDAZOLAM HCL 2 MG/2ML IJ SOLN
1.0000 mg | INTRAMUSCULAR | Status: DC | PRN
  Administered 2014-02-06 – 2014-02-07 (×9): 2 mg via INTRAVENOUS
  Filled 2014-02-06 (×9): qty 2

## 2014-02-06 MED ORDER — CHLORHEXIDINE GLUCONATE 0.12 % MT SOLN
15.0000 mL | Freq: Two times a day (BID) | OROMUCOSAL | Status: DC
  Administered 2014-02-06 – 2014-02-09 (×7): 15 mL via OROMUCOSAL
  Filled 2014-02-06 (×9): qty 15

## 2014-02-06 MED ORDER — SUCCINYLCHOLINE CHLORIDE 20 MG/ML IJ SOLN
INTRAMUSCULAR | Status: AC
  Administered 2014-02-06: 100 mg
  Filled 2014-02-06: qty 1

## 2014-02-06 NOTE — Progress Notes (Signed)
Dr. Legrand Rams returned page. New order for IV ativan, see MAR/orders. Dr. Legrand Rams, however, did not feel comfortable with starting Precedex drip as he stated he was not familiar with patient. Will make ICU RN aware.

## 2014-02-06 NOTE — Progress Notes (Signed)
eLink Physician-Brief Progress Note Patient Name: [name] DOB: [birth year] MRN: [id number]  Date of Service  02/06/2014   HPI/Events of Note   COPD on home O2, diastolic CHF with volume issues (on demadex + zaroxlyn), AVR/ MVR - functioning well on echo 07/2013, now with resp distress , increased wob on NRB -diminished per RT CXR c/w CHF, cannot R?o RLL pna? aspiration  eICU Interventions  Lasix 80 mg Iv now BiPAP If WOB persists on bipap, will intubate   Intervention Category Major Interventions: Respiratory failure - evaluation and management  [name] V. 02/06/2014, 7:01 PM

## 2014-02-06 NOTE — Progress Notes (Signed)
02/06/14 1720 Patient belongings bag noted to have cash, change, knife, lighter, keys, and bottle of nitroglycerin tablets by Lexine Baton, Therapist, sports. Patient agreeable to have valuables locked up with security, nitroglycerin sent to pharmacy for safety. Notified pharmacy and security. Belongings sent with security guard for safe keeping. Donavan Foil, RN

## 2014-02-06 NOTE — Telephone Encounter (Signed)
Message copied by Bernita Raisin on Tue Feb 06, 2014  4:54 PM ------      Message from: Hendricks F      Created: Tue Feb 06, 2014  3:58 PM       Labs suggest she is getting a little dehydrated. I would stop her once weeks metolazone. Repeat BMET 2 weeks.                   Zandra Abts MD ------

## 2014-02-06 NOTE — Procedures (Addendum)
Intubation Procedure Note [name] [id number] June 19, 1946  Procedure: Intubation Indications: Airway protection and maintenance  Procedure Details Consent: Unable to obtain consent because of altered level of consciousness. Time Out: Verified patient identification, verified procedure, site/side was marked, verified correct patient position, special equipment/implants available, medications/allergies/relevent history reviewed, required imaging and test results available.  Performed  Maximum sterile technique was used including gloves and mask.  Glidescope #3 ETT 7.5 Secured at 23 at the lip   Evaluation Hemodynamic Status: BP stable throughout; O2 sats: stable throughout Patient's Current Condition: stable Complications: No apparent complications Patient did tolerate procedure well. Chest X-ray ordered to verify placement.  CXR: pending.  Asked by primary team to intubate patient due to worsening respiratory failure not responsive to bipap. Patient lethargic with increased WOB on my exam, unable to give consent as he is agitated and altered. No hypoxia while on BiPAP. Did not drop sats during uneventful intubation. Will follow CXR. VSS throughout procedure. Care back to primary team.  Sherwood Gambler T 02/06/2014

## 2014-02-06 NOTE — Progress Notes (Signed)
Subjective: He says he's not having any nosebleed. He is having more episodes of dyspnea. This morning he became very dyspneic and hypoxic and he is now on a nonrebreather mask.  Objective: Vital signs in last 24 hours: Temp:  [96.7 F (35.9 C)-98.9 F (37.2 C)] 96.7 F (35.9 C) (08/04 0832) Pulse Rate:  [80-111] 80 (08/04 0832) Resp:  [16-20] 20 (08/04 0832) BP: (132-144)/(53-66) 144/53 mmHg (08/04 0832) SpO2:  [88 %-95 %] 94 % (08/04 0832) FiO2 (%):  [55 %] 55 % (08/04 0736) Weight change:  Last BM Date: 02/04/14  Intake/Output from previous day: 08/03 0701 - 08/04 0700 In: 960 [P.O.:960] Out: 1095 [Urine:1095]  PHYSICAL EXAM General appearance: alert, cooperative and mild distress Resp: rhonchi bilaterally Cardio: His heart is somewhat irregular and he has prosthetic mitral and aortic sounds GI: soft, non-tender; bowel sounds normal; no masses,  no organomegaly Extremities: He has very little edema but he does have some chronic changes in his legs  Lab Results:  Results for orders placed during the hospital encounter of 02/04/14 (from the past 48 hour(s))  PROTIME-INR     Status: Abnormal   Collection Time    02/04/14  7:03 PM      Result Value Ref Range   Prothrombin Time 28.3 (*) 11.6 - 15.2 seconds   INR 2.65 (*) 0.00 - 1.49  CBC WITH DIFFERENTIAL     Status: Abnormal   Collection Time    02/04/14  7:03 PM      Result Value Ref Range   WBC 8.1  4.0 - 10.5 K/uL   RBC 3.69 (*) 4.22 - 5.81 MIL/uL   Hemoglobin 10.4 (*) 13.0 - 17.0 g/dL   HCT 32.7 (*) 39.0 - 52.0 %   MCV 88.6  78.0 - 100.0 fL   MCH 28.2  26.0 - 34.0 pg   MCHC 31.8  30.0 - 36.0 g/dL   RDW 16.8 (*) 11.5 - 15.5 %   Platelets 258  150 - 400 K/uL   Neutrophils Relative % 81 (*) 43 - 77 %   Neutro Abs 6.6  1.7 - 7.7 K/uL   Lymphocytes Relative 4 (*) 12 - 46 %   Lymphs Abs 0.3 (*) 0.7 - 4.0 K/uL   Monocytes Relative 12  3 - 12 %   Monocytes Absolute 1.0  0.1 - 1.0 K/uL   Eosinophils Relative 2  0 -  5 %   Eosinophils Absolute 0.1  0.0 - 0.7 K/uL   Basophils Relative 1  0 - 1 %   Basophils Absolute 0.0  0.0 - 0.1 K/uL  BASIC METABOLIC PANEL     Status: Abnormal   Collection Time    02/04/14  7:03 PM      Result Value Ref Range   Sodium 133 (*) 137 - 147 mEq/L   Potassium 3.8  3.7 - 5.3 mEq/L   Chloride 89 (*) 96 - 112 mEq/L   CO2 31  19 - 32 mEq/L   Glucose, Bld 108 (*) 70 - 99 mg/dL   BUN 45 (*) 6 - 23 mg/dL   Creatinine, Ser 1.33  0.50 - 1.35 mg/dL   Calcium 9.4  8.4 - 10.5 mg/dL   GFR calc non Af Amer 53 (*) >90 mL/min   GFR calc Af Amer 62 (*) >90 mL/min   Comment: (NOTE)     The eGFR has been calculated using the CKD EPI equation.     This calculation has not been validated in  all clinical situations.     eGFR's persistently <90 mL/min signify possible Chronic Kidney     Disease.   Anion gap 13  5 - 15  CBC     Status: Abnormal   Collection Time    02/05/14  5:57 AM      Result Value Ref Range   WBC 8.8  4.0 - 10.5 K/uL   RBC 3.73 (*) 4.22 - 5.81 MIL/uL   Hemoglobin 10.3 (*) 13.0 - 17.0 g/dL   HCT 33.7 (*) 39.0 - 52.0 %   MCV 90.3  78.0 - 100.0 fL   MCH 27.6  26.0 - 34.0 pg   MCHC 30.6  30.0 - 36.0 g/dL   RDW 16.9 (*) 11.5 - 15.5 %   Platelets 286  150 - 400 K/uL  COMPREHENSIVE METABOLIC PANEL     Status: Abnormal   Collection Time    02/05/14  5:57 AM      Result Value Ref Range   Sodium 135 (*) 137 - 147 mEq/L   Potassium 3.9  3.7 - 5.3 mEq/L   Chloride 91 (*) 96 - 112 mEq/L   CO2 32  19 - 32 mEq/L   Glucose, Bld 110 (*) 70 - 99 mg/dL   BUN 40 (*) 6 - 23 mg/dL   Creatinine, Ser 1.24  0.50 - 1.35 mg/dL   Calcium 9.4  8.4 - 10.5 mg/dL   Total Protein 8.0  6.0 - 8.3 g/dL   Albumin 3.8  3.5 - 5.2 g/dL   AST 28  0 - 37 U/L   ALT 10  0 - 53 U/L   Alkaline Phosphatase 125 (*) 39 - 117 U/L   Total Bilirubin 0.9  0.3 - 1.2 mg/dL   GFR calc non Af Amer 58 (*) >90 mL/min   GFR calc Af Amer 67 (*) >90 mL/min   Comment: (NOTE)     The eGFR has been calculated  using the CKD EPI equation.     This calculation has not been validated in all clinical situations.     eGFR's persistently <90 mL/min signify possible Chronic Kidney     Disease.   Anion gap 12  5 - 15  PROTIME-INR     Status: Abnormal   Collection Time    02/05/14  5:57 AM      Result Value Ref Range   Prothrombin Time 26.6 (*) 11.6 - 15.2 seconds   INR 2.45 (*) 0.00 - 1.49  PROTIME-INR     Status: Abnormal   Collection Time    02/06/14  5:49 AM      Result Value Ref Range   Prothrombin Time 32.0 (*) 11.6 - 15.2 seconds   INR 3.11 (*) 0.00 - 1.49    ABGS No results found for this basename: PHART, PCO2, PO2ART, TCO2, HCO3,  in the last 72 hours CULTURES No results found for this or any previous visit (from the past 240 hour(s)). Studies/Results: No results found.  Medications:  Prior to Admission:  Prescriptions prior to admission  Medication Sig Dispense Refill  . albuterol (PROVENTIL) (2.5 MG/3ML) 0.083% nebulizer solution Take 3 mLs (2.5 mg total) by nebulization every 2 (two) hours as needed for wheezing.  75 mL  12  . amLODipine (NORVASC) 10 MG tablet Take 10 mg by mouth daily.       Marland Kitchen atorvastatin (LIPITOR) 40 MG tablet Take 40 mg by mouth daily. This Replace Zocor(Simvastatin)      . clopidogrel (PLAVIX) 75  MG tablet Take 1 tablet (75 mg total) by mouth daily with breakfast.  30 tablet  6  . ferrous sulfate 325 (65 FE) MG EC tablet Take 325 mg by mouth daily with breakfast.      . folic acid (FOLVITE) 1 MG tablet Take 1 tablet (1 mg total) by mouth daily.  30 tablet  4  . levalbuterol (XOPENEX) 0.63 MG/3ML nebulizer solution Take 3 mLs (0.63 mg total) by nebulization every 6 (six) hours as needed for wheezing or shortness of breath.  3 mL  4  . magnesium oxide (MAG-OX) 400 (241.3 MG) MG tablet Take 1 tablet (400 mg total) by mouth daily.  30 tablet  6  . metolazone (ZAROXOLYN) 2.5 MG tablet Take 2.5 mg (1 tablet) every week as needed for swelling.  12 tablet  3  .  metoprolol (LOPRESSOR) 100 MG tablet Take 1 tablet (100 mg total) by mouth 2 (two) times daily.  60 tablet  6  . Multiple Vitamin (MULTIVITAMIN WITH MINERALS) TABS Take 1 tablet by mouth daily.      . pantoprazole (PROTONIX) 40 MG tablet Take 40 mg by mouth daily.      . potassium chloride SA (K-DUR,KLOR-CON) 20 MEQ tablet Take 1 tablet (20 mEq total) by mouth daily.  90 tablet  3  . thiamine 100 MG tablet Take 1 tablet (100 mg total) by mouth daily.  30 tablet  4  . torsemide (DEMADEX) 20 MG tablet Take 3 tablets (60 mg total) by mouth 2 (two) times daily.  180 tablet  6  . warfarin (COUMADIN) 5 MG tablet Take 5-7.5 mg by mouth daily. Takes 7.$RemoveBefore'5mg'cOwJOlDQaCPAp$  on Mondays & $RemoveB'5mg'ihDZsBug$  all other days of week      . acetaminophen (TYLENOL) 500 MG tablet Take 1,000 mg by mouth daily as needed for mild pain or headache.      . loperamide (IMODIUM) 2 MG capsule Take 2 capsules (4 mg total) by mouth every 12 (twelve) hours as needed for diarrhea or loose stools.  30 capsule  0  . nitroGLYCERIN (NITROSTAT) 0.4 MG SL tablet Place 1 tablet (0.4 mg total) under the tongue every 5 (five) minutes x 3 doses as needed for chest pain.  25 tablet  3   Scheduled: . albuterol  2.5 mg Nebulization BID  . amLODipine  10 mg Oral Daily  . antiseptic oral rinse  7 mL Mouth Rinse BID  . atorvastatin  40 mg Oral Daily  . clopidogrel  75 mg Oral Q breakfast  . LORazepam  0-4 mg Oral Q6H   Followed by  . [START ON 02/07/2014] LORazepam  0-4 mg Oral Q12H  . metoprolol  100 mg Oral BID  . pantoprazole  40 mg Oral Daily  . potassium chloride SA  20 mEq Oral Daily  . sodium chloride  3 mL Intravenous Q12H  . thiamine  100 mg Oral Daily  . torsemide  60 mg Oral BID  . Warfarin - Pharmacist Dosing Inpatient   Does not apply Q24H   Continuous:  VEH:MCNOBS chloride, acetaminophen, albuterol, loperamide, LORazepam, LORazepam, sodium chloride  Assesment: He was brought into the hospital for recurrent epistaxis. He has history of multiple  cardiac problems including congestive heart failure and he has had episodes of flash pulmonary edema in the past. He is hypoxic this morning so that needs to be ruled out. He does not have significant edema in his legs. Principal Problem:   Epistaxis, recurrent Active Problems:   MITRAL VALVE  REPLACEMENT, HX OF   AORTIC VALVE REPLACEMENT, HX OF   Chronic anticoagulation   Epistaxis    Plan: He will have a chest x-ray. He will continue on high flow oxygen. He may need more diuresis    LOS: 2 days   Tea Collums L 02/06/2014, 8:47 AM

## 2014-02-06 NOTE — Progress Notes (Signed)
Patient becoming more and more agitated. Dr. Legrand Rams paged for possible order for Precedex drip per ICU RN request and IV ativan. Awaiting return call at this time.

## 2014-02-06 NOTE — Progress Notes (Signed)
[place] Progress Note Patient Name: JLON BETKER DOB: [birth year] MRN: [id number]  Date of Service  02/06/2014   HPI/Events of Note     eICU Interventions  Vent/sedation orders given Versed/ fentnayl Hold coumadin   Intervention Category Major Interventions: Respiratory failure - evaluation and management  Marissa Lowrey V. 02/06/2014, 7:27 PM

## 2014-02-06 NOTE — Progress Notes (Addendum)
ANTICOAGULATION CONSULT NOTE - follow up  Pharmacy Consult for Warfarin Indication: MVR/AVR  No Known Allergies  Patient Measurements: Height: 5\' 6"  (167.6 cm) Weight: 153 lb (69.4 kg) IBW/kg (Calculated) : 63.8  Vital Signs: Temp: 98.8 F (37.1 C) (08/04 1336) Temp src: Oral (08/04 1336) BP: 143/75 mmHg (08/04 1336) Pulse Rate: 110 (08/04 1336)  Labs:  Recent Labs  02/04/14 1903 02/05/14 0557 02/06/14 0549  HGB 10.4* 10.3* 9.4*  HCT 32.7* 33.7* 31.4*  PLT 258 286 271  LABPROT 28.3* 26.6* 32.0*  INR 2.65* 2.45* 3.11*  CREATININE 1.33 1.24 1.32   Estimated Creatinine Clearance: 48.3 ml/min (by C-G formula based on Cr of 1.32).  Medical History: Past Medical History  Diagnosis Date  . Essential hypertension, benign   . Coronary atherosclerosis of native coronary artery     a. CABG/MVR/AVR-2003 (#25 St. Jude/#21 St. Jude); anticoagulation; b. negative stress nuclear-2005;  c. 01/2013 NSTEMI/Cath/PCI: LM nl, LAD 50p, 40-12m, D1 50ost, LCX 74m, RCA 50/70m (2.5x20 Promus DES), 50d, VG->Diag 100, VG->PDA 100, VG->OM 100, LIMA->LAD ok.  . Epistaxis     Requiring cautery & aterial ligation-09/2009  . GERD (gastroesophageal reflux disease)   . Hyperlipemia   . Abnormal LFTs     Possible cirrhosis  . Chronic anticoagulation   . Valvular heart disease     a. 2003: MVR/AVR-2003 (#25 St. Jude/#21 St. Jude);  b. 01/2013 Echo: EF 55%, Mech AVR mean grad 12, Mech MVR mean grad 6.  . Tobacco abuse     45 pack years  . Fasting hyperglycemia   . Nephrolithiasis   . Diverticulosis   . Hyponatremia   . Chronic diastolic CHF (congestive heart failure)   . Chronic renal disease   . Hemolytic anemia   . ETOH abuse   . Atrial fibrillation 01/16/2013    On coumadin DCCV 07/2013.   Marland Kitchen COPD (chronic obstructive pulmonary disease)    Medications:  Prescriptions prior to admission  Medication Sig Dispense Refill  . albuterol (PROVENTIL) (2.5 MG/3ML) 0.083% nebulizer solution Take 3 mLs  (2.5 mg total) by nebulization every 2 (two) hours as needed for wheezing.  75 mL  12  . amLODipine (NORVASC) 10 MG tablet Take 10 mg by mouth daily.       Marland Kitchen atorvastatin (LIPITOR) 40 MG tablet Take 40 mg by mouth daily. This Replace Zocor(Simvastatin)      . clopidogrel (PLAVIX) 75 MG tablet Take 1 tablet (75 mg total) by mouth daily with breakfast.  30 tablet  6  . ferrous sulfate 325 (65 FE) MG EC tablet Take 325 mg by mouth daily with breakfast.      . folic acid (FOLVITE) 1 MG tablet Take 1 tablet (1 mg total) by mouth daily.  30 tablet  4  . levalbuterol (XOPENEX) 0.63 MG/3ML nebulizer solution Take 3 mLs (0.63 mg total) by nebulization every 6 (six) hours as needed for wheezing or shortness of breath.  3 mL  4  . magnesium oxide (MAG-OX) 400 (241.3 MG) MG tablet Take 1 tablet (400 mg total) by mouth daily.  30 tablet  6  . metolazone (ZAROXOLYN) 2.5 MG tablet Take 2.5 mg (1 tablet) every week as needed for swelling.  12 tablet  3  . metoprolol (LOPRESSOR) 100 MG tablet Take 1 tablet (100 mg total) by mouth 2 (two) times daily.  60 tablet  6  . Multiple Vitamin (MULTIVITAMIN WITH MINERALS) TABS Take 1 tablet by mouth daily.      Marland Kitchen  pantoprazole (PROTONIX) 40 MG tablet Take 40 mg by mouth daily.      . potassium chloride SA (K-DUR,KLOR-CON) 20 MEQ tablet Take 1 tablet (20 mEq total) by mouth daily.  90 tablet  3  . thiamine 100 MG tablet Take 1 tablet (100 mg total) by mouth daily.  30 tablet  4  . torsemide (DEMADEX) 20 MG tablet Take 3 tablets (60 mg total) by mouth 2 (two) times daily.  180 tablet  6  . warfarin (COUMADIN) 5 MG tablet Take 5-7.5 mg by mouth daily. Takes 7.5mg  on Mondays & 5mg  all other days of week      . acetaminophen (TYLENOL) 500 MG tablet Take 1,000 mg by mouth daily as needed for mild pain or headache.      . loperamide (IMODIUM) 2 MG capsule Take 2 capsules (4 mg total) by mouth every 12 (twelve) hours as needed for diarrhea or loose stools.  30 capsule  0  .  nitroGLYCERIN (NITROSTAT) 0.4 MG SL tablet Place 1 tablet (0.4 mg total) under the tongue every 5 (five) minutes x 3 doses as needed for chest pain.  25 tablet  3   Assessment: Okay for Protocol, recent bleeding epistaxis noted.  No epistaxis today per report.  INR at goal range, last dose 8/1.  Spoke w/ Dr Luan Pulling, ok to give warfarin for now with understanding this may change after he speaks with ENT for finalized plan. Patient is also on PLAVIX in addition to Warfarin.    Goal of Therapy:  INR 2.5- 3.5   Plan:  Warfarin 5mg  po today x 1 Daily PT/INR.  [place] Crane, Maryjo Ragon A 02/06/2014,2:13 PM

## 2014-02-06 NOTE — Clinical Documentation Improvement (Signed)
A cause and effect relationship may not be assumed and must be documented by a provider. Please clarify the relationship, if any, between anticoagulation and epistaxis.  Are the conditions:   Epistaxis secondary to anticoagulation  with Coumadin    Other (please specify) ___________________   Unrelated to each other   Unable to determine   Unknown    Clinical Indicators:  Per 02/04/14 H&P Epistaxis recurrent  Patient has nasal packing in both the nostrils, if no improvement he will need an ENT evaluation.   Chronic anticoagulation  Patient is on chronic anticoagulation with Coumadin due to mechanical aortic and mitral valve  We'll consult pharmacy to manage the dose of Coumadin.     Thank you, Serena Colonel ,RN Clinical Documentation Specialist:  [place] [employer] [facility]

## 2014-02-06 NOTE — Telephone Encounter (Signed)
Pt currently admitted to [facility] room 310 in CHF per family.I was unable to give her your instructions as they are quite concerned over his health    Will forward to [place]

## 2014-02-06 NOTE — Progress Notes (Signed)
Dr Luan Pulling was notified of rash in groin with yellow paste in creases.  New order for nystatin powder.

## 2014-02-06 NOTE — Progress Notes (Signed)
Pt became agitated, O2 sats dropped into 60-70 percent. Pt placed on NRB, O2 sats increased to 90-100 percent. All other VS stable at this time. Dr. Luan Pulling at bedside. New orders for chest X-ray and lab work placed.

## 2014-02-06 NOTE — Progress Notes (Signed)
[facility] Progress Note Patient Name: [name] DOB: 01/23/1946 MRN: [id number]  Date of Service  02/06/2014   HPI/Events of Note   abg reviewed  eICU Interventions  Increase RR to 20   Intervention Category Major Interventions: Acid-Base disturbance - evaluation and management  [name] V. 02/06/2014, 9:37 PM

## 2014-02-06 NOTE — Progress Notes (Signed)
02/06/14 1717 Patient has been increasingly agitated this afternoon, continues to frequently remove NRB mask. Safety sitter at bedside currently. Notified Dr. Luan Pulling, patient remains agitated after receiving ativan as ordered PRN earlier this afternoon. Requiring frequent monitoring from nursing staff. Notified Dr. Luan Pulling. Order received to transfer patient to stepdown unit for closer monitoring. Donavan Foil, RN

## 2014-02-07 ENCOUNTER — Inpatient Hospital Stay (HOSPITAL_COMMUNITY): Payer: Medicare Other

## 2014-02-07 LAB — CBC WITH DIFFERENTIAL/PLATELET
Basophils Absolute: 0 10*3/uL (ref 0.0–0.1)
Basophils Relative: 1 % (ref 0–1)
Eosinophils Absolute: 0.2 10*3/uL (ref 0.0–0.7)
Eosinophils Relative: 3 % (ref 0–5)
HCT: 28.9 % — ABNORMAL LOW (ref 39.0–52.0)
HEMOGLOBIN: 9 g/dL — AB (ref 13.0–17.0)
LYMPHS ABS: 0.4 10*3/uL — AB (ref 0.7–4.0)
LYMPHS PCT: 6 % — AB (ref 12–46)
MCH: 28.7 pg (ref 26.0–34.0)
MCHC: 31.1 g/dL (ref 30.0–36.0)
MCV: 92 fL (ref 78.0–100.0)
Monocytes Absolute: 0.5 10*3/uL (ref 0.1–1.0)
Monocytes Relative: 7 % (ref 3–12)
NEUTROS ABS: 5.5 10*3/uL (ref 1.7–7.7)
NEUTROS PCT: 84 % — AB (ref 43–77)
Platelets: 194 10*3/uL (ref 150–400)
RBC: 3.14 MIL/uL — AB (ref 4.22–5.81)
RDW: 17.1 % — ABNORMAL HIGH (ref 11.5–15.5)
WBC: 6.6 10*3/uL (ref 4.0–10.5)

## 2014-02-07 LAB — BASIC METABOLIC PANEL
Anion gap: 11 (ref 5–15)
BUN: 44 mg/dL — ABNORMAL HIGH (ref 6–23)
CHLORIDE: 97 meq/L (ref 96–112)
CO2: 32 meq/L (ref 19–32)
Calcium: 9.2 mg/dL (ref 8.4–10.5)
Creatinine, Ser: 1.39 mg/dL — ABNORMAL HIGH (ref 0.50–1.35)
GFR calc Af Amer: 59 mL/min — ABNORMAL LOW (ref >90)
GFR, EST NON AFRICAN AMERICAN: 51 mL/min — AB (ref >90)
GLUCOSE: 93 mg/dL (ref 70–99)
POTASSIUM: 3.7 meq/L (ref 3.7–5.3)
SODIUM: 140 meq/L (ref 137–147)

## 2014-02-07 LAB — PROTIME-INR
INR: 6.21 (ref 0.00–1.49)
Prothrombin Time: 55 seconds — ABNORMAL HIGH (ref 11.6–15.2)

## 2014-02-07 MED ORDER — PIPERACILLIN-TAZOBACTAM 3.375 G IVPB
3.3750 g | Freq: Three times a day (TID) | INTRAVENOUS | Status: DC
  Administered 2014-02-07 – 2014-02-11 (×12): 3.375 g via INTRAVENOUS
  Filled 2014-02-07 (×16): qty 50

## 2014-02-07 MED ORDER — METOPROLOL TARTRATE 1 MG/ML IV SOLN
10.0000 mg | Freq: Four times a day (QID) | INTRAVENOUS | Status: DC
  Administered 2014-02-07 (×2): 10 mg via INTRAVENOUS
  Administered 2014-02-08: 5 mg via INTRAVENOUS
  Administered 2014-02-08: 10 mg via INTRAVENOUS
  Filled 2014-02-07 (×4): qty 10

## 2014-02-07 MED ORDER — POTASSIUM CHLORIDE 20 MEQ/15ML (10%) PO LIQD
20.0000 meq | Freq: Two times a day (BID) | ORAL | Status: DC
  Administered 2014-02-07 – 2014-02-10 (×7): 20 meq via ORAL
  Filled 2014-02-07 (×7): qty 30

## 2014-02-07 MED ORDER — FENTANYL CITRATE 0.05 MG/ML IJ SOLN
INTRAMUSCULAR | Status: AC
  Filled 2014-02-07: qty 50

## 2014-02-07 MED ORDER — FUROSEMIDE 10 MG/ML IJ SOLN
80.0000 mg | Freq: Two times a day (BID) | INTRAMUSCULAR | Status: DC
  Administered 2014-02-07 – 2014-02-08 (×2): 80 mg via INTRAVENOUS
  Filled 2014-02-07 (×2): qty 8

## 2014-02-07 MED ORDER — FENTANYL BOLUS VIA INFUSION
50.0000 ug | INTRAVENOUS | Status: DC | PRN
  Filled 2014-02-07: qty 100

## 2014-02-07 MED ORDER — FENTANYL CITRATE 0.05 MG/ML IJ SOLN
0.0000 ug/h | INTRAMUSCULAR | Status: DC
  Administered 2014-02-07: 50 ug/h via INTRAVENOUS
  Administered 2014-02-08: 25 ug/h via INTRAVENOUS
  Filled 2014-02-07: qty 50

## 2014-02-07 MED ORDER — PANTOPRAZOLE SODIUM 40 MG IV SOLR
40.0000 mg | INTRAVENOUS | Status: DC
Start: 2014-02-07 — End: 2014-02-11
  Administered 2014-02-07 – 2014-02-11 (×5): 40 mg via INTRAVENOUS
  Filled 2014-02-07 (×5): qty 40

## 2014-02-07 MED ORDER — CETYLPYRIDINIUM CHLORIDE 0.05 % MT LIQD
7.0000 mL | Freq: Every day | OROMUCOSAL | Status: DC
  Administered 2014-02-07 – 2014-02-10 (×13): 7 mL via OROMUCOSAL

## 2014-02-07 NOTE — Progress Notes (Signed)
ANTIBIOTIC CONSULT NOTE - INITIAL  Pharmacy Consult for Zosyn Indication: aspiration pneumonia  No Known Allergies  Patient Measurements: Height: 5\' 6"  (167.6 cm) Weight: 149 lb 14.6 oz (68 kg) IBW/kg (Calculated) : 63.8  Vital Signs: Temp: 97.8 F (36.6 C) (08/05 0400) Temp src: Axillary (08/05 0400) BP: 151/62 mmHg (08/05 0700) Pulse Rate: 99 (08/05 0739) Intake/Output from previous day: 08/04 0701 - 08/05 0700 In: 468 [P.O.:360; I.V.:48; NG/GT:60] Out: 1400 [Urine:1400] Intake/Output from this shift:    Labs:  Recent Labs  02/05/14 0557 02/06/14 0549 02/07/14 0421  WBC 8.8 9.9 6.6  HGB 10.3* 9.4* 9.0*  PLT 286 271 194  CREATININE 1.24 1.32 1.39*   Estimated Creatinine Clearance: 45.9 ml/min (by C-G formula based on Cr of 1.39). No results found for this basename: VANCOTROUGH, Corlis Leak, VANCORANDOM, [place], GENTPEAK, GENTRANDOM, TOBRATROUGH, TOBRAPEAK, TOBRARND, AMIKACINPEAK, AMIKACINTROU, AMIKACIN,  in the last 72 hours   Microbiology: Recent Results (from the past 720 hour(s))  MRSA PCR SCREENING     Status: None   Collection Time    02/06/14  6:47 PM      Result Value Ref Range Status   MRSA by PCR NEGATIVE  NEGATIVE Final   Comment:            The GeneXpert MRSA Assay (FDA     approved for NASAL specimens     only), is one component of a     comprehensive MRSA colonization     surveillance program. It is not     intended to diagnose MRSA     infection nor to guide or     monitor treatment for     MRSA infections.   Medical History: Past Medical History  Diagnosis Date  . Essential hypertension, benign   . Coronary atherosclerosis of native coronary artery     a. CABG/MVR/AVR-2003 (#25 St. Jude/#21 St. Jude); anticoagulation; b. negative stress nuclear-2005;  c. 01/2013 NSTEMI/Cath/PCI: LM nl, LAD 50p, 40-34m, D1 50ost, LCX 32m, RCA 50/28m (2.5x20 Promus DES), 50d, VG->Diag 100, VG->PDA 100, VG->OM 100, LIMA->LAD ok.  . Epistaxis     Requiring  cautery & aterial ligation-09/2009  . GERD (gastroesophageal reflux disease)   . Hyperlipemia   . Abnormal LFTs     Possible cirrhosis  . Chronic anticoagulation   . Valvular heart disease     a. 2003: MVR/AVR-2003 (#25 St. Jude/#21 St. Jude);  b. 01/2013 Echo: EF 55%, Mech AVR mean grad 12, Mech MVR mean grad 6.  . Tobacco abuse     45 pack years  . Fasting hyperglycemia   . Nephrolithiasis   . Diverticulosis   . Hyponatremia   . Chronic diastolic CHF (congestive heart failure)   . Chronic renal disease   . Hemolytic anemia   . ETOH abuse   . Atrial fibrillation 01/16/2013    On coumadin DCCV 07/2013.   Marland Kitchen COPD (chronic obstructive pulmonary disease)    Zosyn 8/5 >>  Assessment: 69yo male in ICU with respiratory failure, intubated.  Asked to initiate Zosyn for suspected aspiration pneumonia.  Estimated Creatinine Clearance: 45.9 ml/min (by C-G formula based on Cr of 1.39).  Goal of Therapy:  Eradicate infection.  Plan:  Zosyn 3.375gm IV q8h, each dose over 4 hours Monitor labs, renal fxn, and cultures  Hart Robinsons A 02/07/2014,8:02 AM

## 2014-02-07 NOTE — Progress Notes (Signed)
Pt was transferred down from 300 for resp issues; report received from [facility]; patient required intubation to maintain oxygenation; Orders received from [facility] and ED physician intubated on the first attempt using the glide scope, 20mg  of Etomidate  and 100mg  of Succs; patient tolerated intubation without difficulty; OGT inserted; portable chest x-ray was completed for tube placement and a KUB was completed for OGT placement verification; Dr Legrand Rams was called and notified that patient required intubation; no new orders received; Patient's sister in law Lelon Frohlich was called and updated as well; will continue to monitor and document any changes

## 2014-02-07 NOTE — Progress Notes (Signed)
Subjective: Yesterday he had more shortness of breath and his chest x-ray looked like he had pulmonary edema. He was not on IV fluids but had saline lock. He was started on intravenous Lasix but continued being agitated he required a sitter for safety. An approximate 5:00 yesterday afternoon there was concern about his mental status and although he had produced urine from his Lasix he continued short of breath and he was transferred to the step down unit. However after transfer he continued to deteriorate and eventually required intubation and mechanical ventilation. He remains intubated on and on mechanical ventilation this morning  Objective: Vital signs in last 24 hours: Temp:  [96.7 F (35.9 C)-98.8 F (37.1 C)] 97.8 F (36.6 C) (08/05 0400) Pulse Rate:  [70-111] 80 (08/05 0700) Resp:  [15-29] 20 (08/05 0700) BP: (95-151)/(43-85) 151/62 mmHg (08/05 0700) SpO2:  [90 %-100 %] 100 % (08/05 0700) FiO2 (%):  [55 %-100 %] 100 % (08/05 0428) Weight:  [68 kg (149 lb 14.6 oz)] 68 kg (149 lb 14.6 oz) (08/04 2000) Weight change:  Last BM Date: 02/06/14  Intake/Output from previous day: 08/04 0701 - 08/05 0700 In: 468 [P.O.:360; I.V.:48; NG/GT:60] Out: 1400 [Urine:1400]  PHYSICAL EXAM General appearance: Intubated sedated on mechanical ventilation Resp: rhonchi bilaterally Cardio: He has prosthetic aortic and mitral valve sounds I don't hear an S3 gallop GI: soft, non-tender; bowel sounds normal; no masses,  no organomegaly Extremities: He has chronic venous stasis but only minimal edema  Lab Results:  Results for orders placed during the hospital encounter of 02/04/14 (from the past 48 hour(s))  PROTIME-INR     Status: Abnormal   Collection Time    02/06/14  5:49 AM      Result Value Ref Range   Prothrombin Time 32.0 (*) 11.6 - 15.2 seconds   INR 3.11 (*) 0.00 - 1.49  CBC     Status: Abnormal   Collection Time    02/06/14  5:49 AM      Result Value Ref Range   WBC 9.9  4.0 - 10.5  K/uL   RBC 3.35 (*) 4.22 - 5.81 MIL/uL   Hemoglobin 9.4 (*) 13.0 - 17.0 g/dL   HCT 31.4 (*) 39.0 - 52.0 %   MCV 93.7  78.0 - 100.0 fL   MCH 28.1  26.0 - 34.0 pg   MCHC 29.9 (*) 30.0 - 36.0 g/dL   RDW 17.3 (*) 11.5 - 15.5 %   Platelets 271  150 - 400 K/uL  BASIC METABOLIC PANEL     Status: Abnormal   Collection Time    02/06/14  5:49 AM      Result Value Ref Range   Sodium 137  137 - 147 mEq/L   Potassium 4.2  3.7 - 5.3 mEq/L   Chloride 92 (*) 96 - 112 mEq/L   CO2 34 (*) 19 - 32 mEq/L   Glucose, Bld 115 (*) 70 - 99 mg/dL   BUN 40 (*) 6 - 23 mg/dL   Creatinine, Ser 1.32  0.50 - 1.35 mg/dL   Calcium 9.2  8.4 - 10.5 mg/dL   GFR calc non Af Amer 54 (*) >90 mL/min   GFR calc Af Amer 62 (*) >90 mL/min   Comment: (NOTE)     The eGFR has been calculated using the CKD EPI equation.     This calculation has not been validated in all clinical situations.     eGFR's persistently <90 mL/min signify possible Chronic Kidney  Disease.   Anion gap 11  5 - 15  PRO B NATRIURETIC PEPTIDE     Status: Abnormal   Collection Time    02/06/14  5:49 AM      Result Value Ref Range   Pro B Natriuretic peptide (BNP) 17258.0 (*) 0 - 125 pg/mL  MRSA PCR SCREENING     Status: None   Collection Time    02/06/14  6:47 PM      Result Value Ref Range   MRSA by PCR NEGATIVE  NEGATIVE   Comment:            The GeneXpert MRSA Assay (FDA     approved for NASAL specimens     only), is one component of a     comprehensive MRSA colonization     surveillance program. It is not     intended to diagnose MRSA     infection nor to guide or     monitor treatment for     MRSA infections.  BLOOD GAS, ARTERIAL     Status: Abnormal   Collection Time    02/06/14  9:00 PM      Result Value Ref Range   FIO2 100.00     Delivery systems VENTILATOR     Mode PRESSURE REGULATED VOLUME CONTROL     VT 550     Rate 15.0     Peep/cpap 5.0     pH, Arterial 7.315 (*) 7.350 - 7.450   pCO2 arterial 62.8 (*) 35.0 - 45.0  mmHg   Comment: CRITICAL RESULT CALLED TO, READ BACK BY AND VERIFIED WITH:     CRITICAL RESULT CALLED TO, READ BACK BY AND VERIFIED WITH:     [facility] AT 2115 02/06/14 ANDERSON, S.RRT   pO2, Arterial 95.5  80.0 - 100.0 mmHg   Bicarbonate 31.1 (*) 20.0 - 24.0 mEq/L   TCO2 29.7  0 - 100 mmol/L   Acid-Base Excess 5.3 (*) 0.0 - 2.0 mmol/L   O2 Saturation 97.2     Patient temperature 37.0     Collection site LEFT RADIAL     Drawn by [id number]     Sample type ARTERIAL     Allens test (pass/fail) PASS  PASS  PROTIME-INR     Status: Abnormal   Collection Time    02/07/14  4:21 AM      Result Value Ref Range   Prothrombin Time 55.0 (*) 11.6 - 15.2 seconds   Comment: RESULT REPEATED AND VERIFIED     CALLED TO SHERRI HAMMOCK RN  ON [id number] AT 0535 BY RESSEGGER R   INR 6.21 (*) 0.00 - 1.49   Comment: RESULT REPEATED AND VERIFIED     CRITICAL RESULT CALLED TO, READ BACK BY AND VERIFIED WITH:     SHERRI HAMMOCK RN  ON [id number] AT 0535 BY RESSEGGER R  CBC WITH DIFFERENTIAL     Status: Abnormal   Collection Time    02/07/14  4:21 AM      Result Value Ref Range   WBC 6.6  4.0 - 10.5 K/uL   RBC 3.14 (*) 4.22 - 5.81 MIL/uL   Hemoglobin 9.0 (*) 13.0 - 17.0 g/dL   HCT 28.9 (*) 39.0 - 52.0 %   MCV 92.0  78.0 - 100.0 fL   MCH 28.7  26.0 - 34.0 pg   MCHC 31.1  30.0 - 36.0 g/dL   RDW 17.1 (*) 11.5 - 15.5 %   Platelets 194  150 - 400  K/uL   Comment: DELTA CHECK NOTED   Neutrophils Relative % 84 (*) 43 - 77 %   Neutro Abs 5.5  1.7 - 7.7 K/uL   Lymphocytes Relative 6 (*) 12 - 46 %   Lymphs Abs 0.4 (*) 0.7 - 4.0 K/uL   Monocytes Relative 7  3 - 12 %   Monocytes Absolute 0.5  0.1 - 1.0 K/uL   Eosinophils Relative 3  0 - 5 %   Eosinophils Absolute 0.2  0.0 - 0.7 K/uL   Basophils Relative 1  0 - 1 %   Basophils Absolute 0.0  0.0 - 0.1 K/uL  BASIC METABOLIC PANEL     Status: Abnormal   Collection Time    02/07/14  4:21 AM      Result Value Ref Range   Sodium 140  137 - 147 mEq/L   Potassium 3.7  3.7  - 5.3 mEq/L   Chloride 97  96 - 112 mEq/L   CO2 32  19 - 32 mEq/L   Glucose, Bld 93  70 - 99 mg/dL   BUN 44 (*) 6 - 23 mg/dL   Creatinine, Ser 1.39 (*) 0.50 - 1.35 mg/dL   Calcium 9.2  8.4 - 10.5 mg/dL   GFR calc non Af Amer 51 (*) >90 mL/min   GFR calc Af Amer 59 (*) >90 mL/min   Comment: (NOTE)     The eGFR has been calculated using the CKD EPI equation.     This calculation has not been validated in all clinical situations.     eGFR's persistently <90 mL/min signify possible Chronic Kidney     Disease.   Anion gap 11  5 - 15    ABGS  Recent Labs  02/06/14 2100  PHART 7.315*  PO2ART 95.5  TCO2 29.7  HCO3 31.1*   CULTURES Recent Results (from the past 240 hour(s))  MRSA PCR SCREENING     Status: None   Collection Time    02/06/14  6:47 PM      Result Value Ref Range Status   MRSA by PCR NEGATIVE  NEGATIVE Final   Comment:            The GeneXpert MRSA Assay (FDA     approved for NASAL specimens     only), is one component of a     comprehensive MRSA colonization     surveillance program. It is not     intended to diagnose MRSA     infection nor to guide or     monitor treatment for     MRSA infections.   Studies/Results: Dg Abd 1 View  02/06/2014   CLINICAL DATA:  An OG tube placement  EXAM: ABDOMEN - 1 VIEW  COMPARISON:  None.  FINDINGS: Enteric tube tip and side port projects over the expected location of the gastric fundus.  Paucity of bowel gas without definite evidence of obstruction.  Vascular calcifications overlie the lower pelvis and upper thigh bilaterally.  No acute osseus abnormalities.  IMPRESSION: Enteric tube tip and side port projects over the gastric fundus.   Electronically Signed   By: Sandi Mariscal M.D.   On: 02/06/2014 20:01   Portable Chest Xray In Am  02/07/2014   CLINICAL DATA:  Hypoxia  EXAM: PORTABLE CHEST - 1 VIEW  COMPARISON:  February 06, 2014  FINDINGS: Endotracheal tube tip is 4.3 cm above the carina. Nasogastric tube tip and side port are  below the diaphragm. No pneumothorax. There  is diffuse interstitial edema. There is patchy alveolar edema in the bases. There is a moderate right effusion. There is patchy consolidation in both bases. There is cardiomegaly with pulmonary venous hypertension. Patient is status post mitral and aortic valve replacements.  IMPRESSION: Persistent congestive heart failure. Consolidation in both lung bases may be in part due to atelectasis, although superimposed pneumonia in the bases cannot be excluded. No pneumothorax. No adenopathy. Tube positions as described.   Electronically Signed   By: Lowella Grip M.D.   On: 02/07/2014 07:03   Portable Chest Xray  02/06/2014   CLINICAL DATA:  Endotracheal tube placement  EXAM: PORTABLE CHEST - 1 VIEW  COMPARISON:  02/06/2014  FINDINGS: Endotracheal tube in good position. Congestive heart failure with bilateral edema shows mild progression. Right pleural effusion unchanged. No significant left effusion.  IMPRESSION: Satisfactory endotracheal tube position. Mild progression of pulmonary edema.   Electronically Signed   By: Franchot Gallo M.D.   On: 02/06/2014 19:40   Dg Chest Port 1 View  02/06/2014   CLINICAL DATA:  Short of breath and decreasing oxygen saturations.  EXAM: PORTABLE CHEST - 1 VIEW  COMPARISON:  12/04/2013  FINDINGS: There are changes from cardiac surgery and valve replacement, stable. Cardiac silhouette is mildly enlarged. There is vascular congestion, irregular interstitial thickening and hazy mid and lower lung zone airspace opacity, greater on the right. Hemidiaphragms are partly obscured consistent small bilateral effusions. No pneumothorax.  IMPRESSION: Congestive heart failure   Electronically Signed   By: Lajean Manes M.D.   On: 02/06/2014 11:09    Medications:  Prior to Admission:  Prescriptions prior to admission  Medication Sig Dispense Refill  . albuterol (PROVENTIL) (2.5 MG/3ML) 0.083% nebulizer solution Take 3 mLs (2.5 mg total) by  nebulization every 2 (two) hours as needed for wheezing.  75 mL  12  . amLODipine (NORVASC) 10 MG tablet Take 10 mg by mouth daily.       Marland Kitchen atorvastatin (LIPITOR) 40 MG tablet Take 40 mg by mouth daily. This Replace Zocor(Simvastatin)      . clopidogrel (PLAVIX) 75 MG tablet Take 1 tablet (75 mg total) by mouth daily with breakfast.  30 tablet  6  . ferrous sulfate 325 (65 FE) MG EC tablet Take 325 mg by mouth daily with breakfast.      . folic acid (FOLVITE) 1 MG tablet Take 1 tablet (1 mg total) by mouth daily.  30 tablet  4  . levalbuterol (XOPENEX) 0.63 MG/3ML nebulizer solution Take 3 mLs (0.63 mg total) by nebulization every 6 (six) hours as needed for wheezing or shortness of breath.  3 mL  4  . magnesium oxide (MAG-OX) 400 (241.3 MG) MG tablet Take 1 tablet (400 mg total) by mouth daily.  30 tablet  6  . metolazone (ZAROXOLYN) 2.5 MG tablet Take 2.5 mg (1 tablet) every week as needed for swelling.  12 tablet  3  . metoprolol (LOPRESSOR) 100 MG tablet Take 1 tablet (100 mg total) by mouth 2 (two) times daily.  60 tablet  6  . Multiple Vitamin (MULTIVITAMIN WITH MINERALS) TABS Take 1 tablet by mouth daily.      . pantoprazole (PROTONIX) 40 MG tablet Take 40 mg by mouth daily.      . potassium chloride SA (K-DUR,KLOR-CON) 20 MEQ tablet Take 1 tablet (20 mEq total) by mouth daily.  90 tablet  3  . thiamine 100 MG tablet Take 1 tablet (100 mg total) by mouth daily.  30 tablet  4  . torsemide (DEMADEX) 20 MG tablet Take 3 tablets (60 mg total) by mouth 2 (two) times daily.  180 tablet  6  . warfarin (COUMADIN) 5 MG tablet Take 5-7.5 mg by mouth daily. Takes 7.32m on Mondays & 570mall other days of week      . acetaminophen (TYLENOL) 500 MG tablet Take 1,000 mg by mouth daily as needed for mild pain or headache.      . loperamide (IMODIUM) 2 MG capsule Take 2 capsules (4 mg total) by mouth every 12 (twelve) hours as needed for diarrhea or loose stools.  30 capsule  0  . nitroGLYCERIN (NITROSTAT)  0.4 MG SL tablet Place 1 tablet (0.4 mg total) under the tongue every 5 (five) minutes x 3 doses as needed for chest pain.  25 tablet  3   Scheduled: . albuterol  2.5 mg Nebulization BID  . amLODipine  10 mg Oral Daily  . antiseptic oral rinse  7 mL Mouth Rinse BID  . atorvastatin  40 mg Oral Daily  . chlorhexidine  15 mL Mouth Rinse BID  . clopidogrel  75 mg Oral Q breakfast  . furosemide  40 mg Intravenous BID  . LORazepam  0-4 mg Oral Q12H  . metoprolol  100 mg Oral BID  . nystatin   Topical BID  . pantoprazole  40 mg Oral Daily  . potassium chloride SA  20 mEq Oral Daily  . sodium chloride  3 mL Intravenous Q12H  . thiamine  100 mg Oral Daily   Continuous:  PRWYO:VZCHYIhloride, acetaminophen, albuterol, docusate sodium, fentaNYL, loperamide, LORazepam, LORazepam, LORazepam, midazolam, sodium chloride  Assesment: He now has acute on chronic respiratory failure. He is known to have severe COPD. He has coronary artery occlusive disease and congestive heart failure and now has acute on chronic systolic CHF. He has possible pneumonia based on his chest x-ray and that will be treated as if he aspirated. 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 His PT/INR is significantly abnormal today. He was actually admitted with a nosebleed. He has had chronic and recurrent nosebleeds. He still has an inflatable device in his right nostril. I have been unable to contact his ENT physician regarding this but I'm going to leave the inflatable device in at least for now since his PT/INR is so far out Principal Problem:   Epistaxis, recurrent Active Problems:   MITRAL VALVE REPLACEMENT, HX OF   AORTIC VALVE REPLACEMENT, HX OF   Chronic anticoagulation   Epistaxis    Plan: ABGs today. Continue with current treatments including ventilator support. I will request cardiology consultation for his CHF. No Coumadin today. Recheck laboratory work in  the morning. He will begin antibiotics.    LOS: 3 days   Azlin Zilberman L 02/07/2014, 7:30 AM

## 2014-02-07 NOTE — Progress Notes (Signed)
CRITICAL VALUE ALERT  Critical value received:  PT/INR  Date of notification:  02/07/2014  Time of notification:  1657  Critical value read back:Yes.    Nurse who received alert:  Wonda Cheng, RN  MD notified (1st page):  Luan Pulling  Time of first page: 27  MD notified (2nd page): n/a  Time of second page:n/a  Responding MD:  Luan Pulling  Time MD responded:  [id number]  No new orders received at this time

## 2014-02-07 NOTE — Plan of Care (Signed)
Problem: ICU Phase Progression Outcomes Goal: O2 sats trending toward baseline Outcome: Progressing O2 sats increased while maintained on 100% FiO2 through the vent Goal: Hemodynamically stable Outcome: Progressing Vital signs are stable with no drips  Goal: Pain controlled with appropriate interventions Outcome: Progressing Ordered and given every 2 hours Goal: Initial discharge plan identified Outcome: Progressing Questionable if home  Goal: Voiding-avoid urinary catheter unless indicated Outcome: Progressing Foley intact

## 2014-02-07 NOTE — Progress Notes (Signed)
UR review complete.  

## 2014-02-07 NOTE — Progress Notes (Signed)
INITIAL NUTRITION ASSESSMENT  DOCUMENTATION CODES Per approved criteria  -Severe malnutrition in the context of acute illness or injury   Pt meets criteria for severe MALNUTRITION in the context of acute illness as evidenced by <59% estimated energy intake x 5 days, 6.3% wt loss x 1 week, moderate fat and muscle depletion.  INTERVENTION: -If pt is unable to wean within 48 hours of intubation, recommend initiation nutrition support -Recommend start Vital 1.2 AF via OGT @ 20 ml/hr and advance 10 ml/hr every 12 hours to goal rate of 60 ml/hr. Total TF regimen at goal rate will provide 1728 kcals, 108 grams of protein, and 61168 ml fluid daily. This will meet 100% of estimated calorie and protein needs.   NUTRITION DIAGNOSIS: Inadequate oral intake related to inability to eat as evidenced by NPO.   Goal: Pt will meet >90% of estimated nutritional needs  Monitor:  Respiratory status, nutrition support measures, weight changes, labs, I/O's  Reason for Assessment: VDRF  68 y.o. male  Admitting Dx: Epistaxis, recurrent  68 year old male who has a past medical history of Essential hypertension, benign; Coronary atherosclerosis of native coronary artery; Epistaxis; GERD (gastroesophageal reflux disease); Hyperlipemia; Abnormal LFTs; Chronic anticoagulation; Valvular heart disease; Tobacco abuse; Fasting hyperglycemia; Nephrolithiasis; Diverticulosis; Hyponatremia; Chronic diastolic CHF (congestive heart failure); Chronic renal disease; Hemolytic anemia; ETOH abuse; Atrial fibrillation (01/16/2013); and COPD (chronic obstructive pulmonary disease).  Today came to the ED with recurrent nosebleed. Patient visited ED on Friday and had right-sided nosebleed which was packed and sent home, patient started having right-sided nosebleed again on Saturday which was again packed and sent home. But it did not help and again started having nosebleeds today show again the right nostril was packed by the ED  physician. He also packed the left nostril to increase the pressure on right side to prevent recurrent nosebleeds.  Patient is on home oxygen on nasal cannula at 2 L per minute for COPD, and will not be able to use nasal cannula because of the nasal packing in both nostrils. Ventimask has been placed.  Patient denies any other symptoms no chest pain no shortness of breath no nausea vomiting or diarrhea.  No blood in the stools no blood in the urine.  INR in the hospital is 2.93 patient has mechanical mitral and aortic valve and would require and are the range of 2.5-3.5.  ASSESSMENT: Pt admitted to ICU from floor due to respiratory distress. Pt with pulomnary edema, receiving IV lasix. Prior to ICU transfer pt was on a Heart Healthy diet. Intake during admission has been poor (PO: 25-50%). Also noted a 6.3% wt loss since admission, however, diuretic usage may be a contributing factor to wt loss. Wt hx reveals UBW ranges between 150-160# in the past year.  Patient is currently intubated on ventilator support. OGT in place. MV: 11.1 L/min Temp (24hrs), Avg:98 F (36.7 C), Min:97 F (36.1 C), Max:98.8 F (37.1 C)  Nutrition Focused Physical Exam:  Subcutaneous Fat:  Orbital Region: moderate depletion Upper Arm Region: mild depletion Thoracic and Lumbar Region: WDL  Muscle:  Temple Region: moderate depletion Clavicle Bone Region: mild depletion Clavicle and Acromion Bone Region: WDL Scapular Bone Region: moderate depletion Dorsal Hand: WDL Patellar Region: mild depletion Anterior Thigh Region: mild depletion Posterior Calf Region: mild depletion  Edema: none present  Labs reviewed. BUN/Creat: 144/ 1.39. Height: Ht Readings from Last 1 Encounters:  02/06/14 5\' 6"  (1.676 m)    Weight: Wt Readings from Last 1 Encounters:  02/06/14  149 lb 14.6 oz (68 kg)    Ideal Body Weight: 154#  % Ideal Body Weight: 97%  Wt Readings from Last 10 Encounters:  02/06/14 149 lb 14.6 oz (68  kg)  02/04/14 158 lb (71.668 kg)  02/02/14 158 lb (71.668 kg)  01/30/14 159 lb (72.122 kg)  01/26/14 160 lb (72.576 kg)  12/06/13 146 lb 12.8 oz (66.588 kg)  10/30/13 162 lb (73.483 kg)  10/23/13 160 lb (72.576 kg)  10/11/13 160 lb (72.576 kg)  09/04/13 157 lb (71.215 kg)   Usual Body Weight: 160#  % Usual Body Weight: 93%  BMI:  Body mass index is 24.21 kg/(m^2). Normal weight range  Estimated Nutritional Needs: Kcal: 1669.2 Protein: 92-102 grams Fluid: >1.7 L  Skin: rash to lt and rt groin  Diet Order: NPO  EDUCATION NEEDS: -Education not appropriate at this time   Intake/Output Summary (Last 24 hours) at 02/07/14 0900 Last data filed at 02/07/14 0800  Gross per 24 hour  Intake    228 ml  Output   1400 ml  Net  -1172 ml    Last BM: 02/06/14  Labs:   Recent Labs Lab 02/05/14 0557 02/06/14 0549 02/07/14 0421  NA 135* 137 140  K 3.9 4.2 3.7  CL 91* 92* 97  CO2 32 34* 32  BUN 40* 40* 44*  CREATININE 1.24 1.32 1.39*  CALCIUM 9.4 9.2 9.2  GLUCOSE 110* 115* 93    CBG (last 3)  No results found for this basename: GLUCAP,  in the last 72 hours  Lab Results  Component Value Date   HGBA1C 5.1 01/27/2013    Scheduled Meds: . albuterol  2.5 mg Nebulization BID  . antiseptic oral rinse  7 mL Mouth Rinse BID  . chlorhexidine  15 mL Mouth Rinse BID  . clopidogrel  75 mg Oral Q breakfast  . furosemide  80 mg Intravenous BID  . metoprolol  10 mg Intravenous 4 times per day  . nystatin   Topical BID  . pantoprazole (PROTONIX) IV  40 mg Intravenous Q24H  . piperacillin-tazobactam (ZOSYN)  IV  3.375 g Intravenous Q8H  . potassium chloride  20 mEq Oral BID  . sodium chloride  3 mL Intravenous Q12H    Continuous Infusions:   Past Medical History  Diagnosis Date  . Essential hypertension, benign   . Coronary atherosclerosis of native coronary artery     a. CABG/MVR/AVR-2003 (#25 St. Jude/#21 St. Jude); anticoagulation; b. negative stress nuclear-2005;  c.  01/2013 NSTEMI/Cath/PCI: LM nl, LAD 50p, 40-53m, D1 50ost, LCX 67m, RCA 50/14m (2.5x20 Promus DES), 50d, VG->Diag 100, VG->PDA 100, VG->OM 100, LIMA->LAD ok.  . Epistaxis     Requiring cautery & aterial ligation-09/2009  . GERD (gastroesophageal reflux disease)   . Hyperlipemia   . Abnormal LFTs     Possible cirrhosis  . Chronic anticoagulation   . Valvular heart disease     a. 2003: MVR/AVR-2003 (#25 St. Jude/#21 St. Jude);  b. 01/2013 Echo: EF 55%, Mech AVR mean grad 12, Mech MVR mean grad 6.  . Tobacco abuse     45 pack years  . Fasting hyperglycemia   . Nephrolithiasis   . Diverticulosis   . Hyponatremia   . Chronic diastolic CHF (congestive heart failure)   . Chronic renal disease   . Hemolytic anemia   . ETOH abuse   . Atrial fibrillation 01/16/2013    On coumadin DCCV 07/2013.   Marland Kitchen COPD (chronic obstructive pulmonary disease)  Past Surgical History  Procedure Laterality Date  . Endocopic sphenopalatine artery ligation & cautry    . Inguinal hernia repair      Left & right  . Coronary artery bypass graft  11/2001    [facility]  . Cardiac valve replacement  11/2001    AVR and MVR-St. Jude devices  . Cataract extraction w/phaco  01/20/2011    Procedure: CATARACT EXTRACTION PHACO AND INTRAOCULAR LENS PLACEMENT (IOC);  Surgeon: Elta Guadeloupe T. Gershon Crane;  Location: [facility];  Service: Ophthalmology;  Laterality: Right;  CDE: 8.51  . Cataract extraction w/phaco  02/03/2011    Procedure: CATARACT EXTRACTION PHACO AND INTRAOCULAR LENS PLACEMENT (IOC);  Surgeon: Elta Guadeloupe T. Gershon Crane;  Location: [facility];  Service: Ophthalmology;  Laterality: Left;  CDE:10.01  . Colonoscopy  04/05/02; 08/2011    friable anal canal hemorrhoids otherwise normal; 2 diminutive polyps excised, minimal diverticulosis noted  . Esophagogastroduodenoscopy  01/2002    Dr. Laural Golden, submucosal esophageal lesion c/w leiomyoma  . Colonoscopy  09/02/2011    Procedure: COLONOSCOPY;  Surgeon: Daneil Dolin, MD;  Location: [facility];  Service: Endoscopy;  Laterality: N/A;  8:15  . Yag laser application Right 67/67/2094    Procedure: YAG LASER APPLICATION;  Surgeon: Elta Guadeloupe T. Gershon Crane, MD;  Location: [facility];  Service: Ophthalmology;  Laterality: Right;    Lamesha Tibbits A. Jimmye Norman, RD, LDN Pager: [phone]

## 2014-02-08 ENCOUNTER — Inpatient Hospital Stay (HOSPITAL_COMMUNITY): Payer: Medicare Other

## 2014-02-08 DIAGNOSIS — R9439 Abnormal result of other cardiovascular function study: Secondary | ICD-10-CM

## 2014-02-08 DIAGNOSIS — R791 Abnormal coagulation profile: Secondary | ICD-10-CM

## 2014-02-08 DIAGNOSIS — F1010 Alcohol abuse, uncomplicated: Secondary | ICD-10-CM

## 2014-02-08 DIAGNOSIS — J9620 Acute and chronic respiratory failure, unspecified whether with hypoxia or hypercapnia: Secondary | ICD-10-CM

## 2014-02-08 DIAGNOSIS — F17200 Nicotine dependence, unspecified, uncomplicated: Secondary | ICD-10-CM

## 2014-02-08 DIAGNOSIS — I10 Essential (primary) hypertension: Secondary | ICD-10-CM

## 2014-02-08 DIAGNOSIS — R0902 Hypoxemia: Secondary | ICD-10-CM

## 2014-02-08 DIAGNOSIS — I5033 Acute on chronic diastolic (congestive) heart failure: Secondary | ICD-10-CM

## 2014-02-08 DIAGNOSIS — I509 Heart failure, unspecified: Secondary | ICD-10-CM

## 2014-02-08 DIAGNOSIS — Z954 Presence of other heart-valve replacement: Secondary | ICD-10-CM

## 2014-02-08 DIAGNOSIS — D649 Anemia, unspecified: Secondary | ICD-10-CM

## 2014-02-08 DIAGNOSIS — I2510 Atherosclerotic heart disease of native coronary artery without angina pectoris: Secondary | ICD-10-CM

## 2014-02-08 DIAGNOSIS — Z5181 Encounter for therapeutic drug level monitoring: Secondary | ICD-10-CM

## 2014-02-08 DIAGNOSIS — J690 Pneumonitis due to inhalation of food and vomit: Secondary | ICD-10-CM

## 2014-02-08 DIAGNOSIS — E785 Hyperlipidemia, unspecified: Secondary | ICD-10-CM

## 2014-02-08 DIAGNOSIS — E43 Unspecified severe protein-calorie malnutrition: Secondary | ICD-10-CM

## 2014-02-08 DIAGNOSIS — I4891 Unspecified atrial fibrillation: Secondary | ICD-10-CM

## 2014-02-08 LAB — BLOOD GAS, ARTERIAL
Acid-Base Excess: 8 mmol/L — ABNORMAL HIGH (ref 0.0–2.0)
Bicarbonate: 31.5 mEq/L — ABNORMAL HIGH (ref 20.0–24.0)
Drawn by: 21310
FIO2: 50 %
MECHVT: 550 mL
O2 Saturation: 99.1 %
PEEP: 5 cmH2O
PH ART: 7.512 — AB (ref 7.350–7.450)
Patient temperature: 37
RATE: 20 resp/min
TCO2: 28 mmol/L (ref 0–100)
pCO2 arterial: 39.6 mmHg (ref 35.0–45.0)
pO2, Arterial: 128 mmHg — ABNORMAL HIGH (ref 80.0–100.0)

## 2014-02-08 LAB — BASIC METABOLIC PANEL
Anion gap: 16 — ABNORMAL HIGH (ref 5–15)
BUN: 47 mg/dL — AB (ref 6–23)
CHLORIDE: 99 meq/L (ref 96–112)
CO2: 30 meq/L (ref 19–32)
CREATININE: 1.84 mg/dL — AB (ref 0.50–1.35)
Calcium: 9.2 mg/dL (ref 8.4–10.5)
GFR calc Af Amer: 42 mL/min — ABNORMAL LOW (ref >90)
GFR calc non Af Amer: 36 mL/min — ABNORMAL LOW (ref >90)
Glucose, Bld: 109 mg/dL — ABNORMAL HIGH (ref 70–99)
Potassium: 4.1 mEq/L (ref 3.7–5.3)
Sodium: 145 mEq/L (ref 137–147)

## 2014-02-08 LAB — CBC WITH DIFFERENTIAL/PLATELET
BASOS PCT: 0 % (ref 0–1)
Basophils Absolute: 0 10*3/uL (ref 0.0–0.1)
Eosinophils Absolute: 0.4 10*3/uL (ref 0.0–0.7)
Eosinophils Relative: 5 % (ref 0–5)
HCT: 32.1 % — ABNORMAL LOW (ref 39.0–52.0)
Hemoglobin: 9.7 g/dL — ABNORMAL LOW (ref 13.0–17.0)
LYMPHS PCT: 3 % — AB (ref 12–46)
Lymphs Abs: 0.3 10*3/uL — ABNORMAL LOW (ref 0.7–4.0)
MCH: 27.7 pg (ref 26.0–34.0)
MCHC: 30.2 g/dL (ref 30.0–36.0)
MCV: 91.7 fL (ref 78.0–100.0)
MONO ABS: 0.3 10*3/uL (ref 0.1–1.0)
MONOS PCT: 4 % (ref 3–12)
NEUTROS ABS: 7.5 10*3/uL (ref 1.7–7.7)
Neutrophils Relative %: 88 % — ABNORMAL HIGH (ref 43–77)
Platelets: 260 10*3/uL (ref 150–400)
RBC: 3.5 MIL/uL — ABNORMAL LOW (ref 4.22–5.81)
RDW: 17.9 % — ABNORMAL HIGH (ref 11.5–15.5)
WBC: 8.6 10*3/uL (ref 4.0–10.5)

## 2014-02-08 LAB — PROTIME-INR
INR: 8.37 (ref 0.00–1.49)
PROTHROMBIN TIME: 69.4 s — AB (ref 11.6–15.2)

## 2014-02-08 MED ORDER — ATORVASTATIN CALCIUM 40 MG PO TABS
40.0000 mg | ORAL_TABLET | Freq: Every day | ORAL | Status: DC
  Administered 2014-02-08 – 2014-02-11 (×4): 40 mg via ORAL
  Filled 2014-02-08 (×4): qty 1

## 2014-02-08 MED ORDER — METOPROLOL TARTRATE 50 MG PO TABS
100.0000 mg | ORAL_TABLET | Freq: Two times a day (BID) | ORAL | Status: DC
  Administered 2014-02-08 – 2014-02-12 (×9): 100 mg via ORAL
  Filled 2014-02-08 (×9): qty 2

## 2014-02-08 MED ORDER — TORSEMIDE 20 MG PO TABS
40.0000 mg | ORAL_TABLET | Freq: Two times a day (BID) | ORAL | Status: DC
  Administered 2014-02-09 – 2014-02-12 (×7): 40 mg via ORAL
  Filled 2014-02-08 (×7): qty 2

## 2014-02-08 MED ORDER — SODIUM CHLORIDE 0.9 % IV SOLN: Freq: Once | INTRAVENOUS | Status: DC

## 2014-02-08 NOTE — Progress Notes (Signed)
CRITICAL VALUE ALERT  Critical value received:  INR 8.37  Date of notification:  02/08/14  Time of notification:  0550  Critical value read back:Yes.    Nurse who received alert:  Kathe Becton, RN  MD notified (1st page):  Dr. Luan Pulling  Time of first page:  0625  MD notified (2nd page):  Time of second page:  Responding MD: Dr. Luan Pulling  Time MD responded:  0141

## 2014-02-08 NOTE — Procedures (Addendum)
Extubation Procedure Note  Patient Details:   Name: [name] DOB: 1945-12-01 MRN: [id number]   Airway Documentation:  Airway 7.5 mm (Active)  Secured at (cm) 23 cm 02/08/2014  6:58 AM  Measured From Lips 02/08/2014  6:58 AM  Secured Location Left 02/08/2014  6:58 AM  Secured By [employer] 02/08/2014  6:58 AM  Tube Holder Repositioned Yes 02/08/2014  6:58 AM  Cuff Pressure (cm H2O) 24 cm H2O 02/07/2014 11:38 PM  Site Condition Dry 02/08/2014  6:58 AM    Evaluation  O2 sats: stable throughout Complications: No apparent complications Patient did tolerate procedure well. Bilateral Breath Sounds: Diminished;Expiratory wheezes Suctioning: Oral;Airway Yes  Extubated to 45% venturi mask, pt tolerating well, sats 92%.   [name] 02/08/2014, 8:41 AM

## 2014-02-08 NOTE — Progress Notes (Signed)
Subjective: He is much better today. He looks better and is hopeful of coming off the ventilator.  Objective: Vital signs in last 24 hours: Temp:  [97.5 F (36.4 C)-100 F (37.8 C)] 97.5 F (36.4 C) (08/06 0730) Pulse Rate:  [25-139] 139 (08/06 0400) Resp:  [16-20] 20 (08/06 0400) BP: (76-152)/(48-117) 114/66 mmHg (08/06 0400) SpO2:  [94 %-100 %] 99 % (08/06 0658) FiO2 (%):  [40 %-50 %] 40 % (08/06 0658) Weight change:  Last BM Date: 02/04/14  Intake/Output from previous day: 08/05 0701 - 08/06 0700 In: 96.8 [I.V.:46.8; IV Piggyback:50] Out: 1550 [[id number]]  PHYSICAL EXAM General appearance: alert, cooperative and moderate distress Resp: rhonchi bilaterally Cardio: His heart is irregularly irregular. 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 It is not     intended to diagnose MRSA     infection nor to guide or     monitor treatment for     MRSA infections.  BLOOD GAS, ARTERIAL     Status: Abnormal   Collection Time    02/06/14  9:00 PM      Result Value Ref Range   FIO2 100.00     Delivery systems VENTILATOR     Mode PRESSURE REGULATED VOLUME CONTROL     VT 550     Rate 15.0     Peep/cpap 5.0     pH, Arterial 7.315 (*) 7.350 - 7.450   pCO2 arterial 62.8 (*) 35.0 - 45.0  mmHg   Comment: CRITICAL RESULT CALLED TO, READ BACK BY AND VERIFIED WITH:     CRITICAL RESULT CALLED TO, READ BACK BY AND VERIFIED WITH:     [facility] AT 2115 02/06/14 ANDERSON, S.RRT   pO2, Arterial 95.5  80.0 - 100.0 mmHg   Bicarbonate 31.1 (*) 20.0 - 24.0 mEq/L   TCO2 29.7  0 - 100 mmol/L   Acid-Base Excess 5.3 (*) 0.0 - 2.0 mmol/L   O2 Saturation 97.2     Patient temperature 37.0     Collection site LEFT RADIAL     Drawn by [id number]     Sample type ARTERIAL     Allens test (pass/fail) PASS  PASS  PROTIME-INR     Status: Abnormal   Collection Time    02/07/14  4:21 AM      Result Value Ref Range   Prothrombin Time 55.0 (*) 11.6 - 15.2 seconds   Comment: RESULT REPEATED AND VERIFIED     CALLED TO SHERRI HAMMOCK RN  ON [id number] AT 0535 BY RESSEGGER R   INR 6.21 (*) 0.00 - 1.49   Comment: RESULT REPEATED AND VERIFIED  CRITICAL RESULT CALLED TO, READ BACK BY AND VERIFIED WITH:     SHERRI HAMMOCK RN  ON [id number] AT 0535 BY RESSEGGER R  CBC WITH DIFFERENTIAL     Status: Abnormal   Collection Time    02/07/14  4:21 AM      Result Value Ref Range   WBC 6.6  4.0 - 10.5 K/uL   RBC 3.14 (*) 4.22 - 5.81 MIL/uL   Hemoglobin 9.0 (*) 13.0 - 17.0 g/dL   HCT 28.9 (*) 39.0 - 52.0 %   MCV 92.0  78.0 - 100.0 fL   MCH 28.7  26.0 - 34.0 pg   MCHC 31.1  30.0 - 36.0 g/dL   RDW 17.1 (*) 11.5 - 15.5 %   Platelets 194  150 - 400 K/uL   Comment: DELTA CHECK NOTED   Neutrophils Relative % 84 (*) 43 - 77 %   Neutro Abs 5.5  1.7 - 7.7 K/uL   Lymphocytes Relative 6 (*) 12 - 46 %   Lymphs Abs 0.4 (*) 0.7 - 4.0 K/uL   Monocytes Relative 7  3 - 12 %   Monocytes Absolute 0.5  0.1 - 1.0 K/uL   Eosinophils Relative 3  0 - 5 %   Eosinophils Absolute 0.2  0.0 - 0.7 K/uL   Basophils Relative 1  0 - 1 %   Basophils Absolute 0.0  0.0 - 0.1 K/uL  BASIC METABOLIC PANEL     Status: Abnormal   Collection Time    02/07/14  4:21 AM      Result Value Ref Range   Sodium 140  137 - 147 mEq/L   Potassium 3.7  3.7  - 5.3 mEq/L   Chloride 97  96 - 112 mEq/L   CO2 32  19 - 32 mEq/L   Glucose, Bld 93  70 - 99 mg/dL   BUN 44 (*) 6 - 23 mg/dL   Creatinine, Ser 1.39 (*) 0.50 - 1.35 mg/dL   Calcium 9.2  8.4 - 10.5 mg/dL   GFR calc non Af Amer 51 (*) >90 mL/min   GFR calc Af Amer 59 (*) >90 mL/min   Comment: (NOTE)     The eGFR has been calculated using the CKD EPI equation.     This calculation has not been validated in all clinical situations.     eGFR's persistently <90 mL/min signify possible Chronic Kidney     Disease.   Anion gap 11  5 - 15  PROTIME-INR     Status: Abnormal   Collection Time    02/08/14  4:17 AM      Result Value Ref Range   Prothrombin Time 69.4 (*) 11.6 - 15.2 seconds   Comment: CALLED TO OLGA MCCLOUD RN ON [id number] AT 0550 BY RESSEGGER R   INR 8.37 (*) 0.00 - 1.49   Comment: RESULT REPEATED AND VERIFIED     CRITICAL RESULT CALLED TO, READ BACK BY AND VERIFIED WITH:     OLGA MCCLOUD RN ON [id number] AT 0550 BY RESSEGGER R  CBC WITH DIFFERENTIAL     Status: Abnormal   Collection Time    02/08/14  4:17 AM      Result Value Ref Range   WBC 8.6  4.0 - 10.5 K/uL   RBC 3.50 (*) 4.22 - 5.81 MIL/uL   Hemoglobin 9.7 (*) 13.0 - 17.0 g/dL   HCT 32.1 (*) 39.0 - 52.0 %   MCV 91.7  78.0 - 100.0 fL  MCH 27.7  26.0 - 34.0 pg   MCHC 30.2  30.0 - 36.0 g/dL   RDW 17.9 (*) 11.5 - 15.5 %   Platelets 260  150 - 400 K/uL   Comment: DELTA CHECK NOTED   Neutrophils Relative % 88 (*) 43 - 77 %   Neutro Abs 7.5  1.7 - 7.7 K/uL   Lymphocytes Relative 3 (*) 12 - 46 %   Lymphs Abs 0.3 (*) 0.7 - 4.0 K/uL   Monocytes Relative 4  3 - 12 %   Monocytes Absolute 0.3  0.1 - 1.0 K/uL   Eosinophils Relative 5  0 - 5 %   Eosinophils Absolute 0.4  0.0 - 0.7 K/uL   Basophils Relative 0  0 - 1 %   Basophils Absolute 0.0  0.0 - 0.1 K/uL  BASIC METABOLIC PANEL     Status: Abnormal   Collection Time    02/08/14  4:17 AM      Result Value Ref Range   Sodium 145  137 - 147 mEq/L   Potassium 4.1  3.7 - 5.3  mEq/L   Chloride 99  96 - 112 mEq/L   CO2 30  19 - 32 mEq/L   Glucose, Bld 109 (*) 70 - 99 mg/dL   BUN 47 (*) 6 - 23 mg/dL   Creatinine, Ser 1.84 (*) 0.50 - 1.35 mg/dL   Calcium 9.2  8.4 - 10.5 mg/dL   GFR calc non Af Amer 36 (*) >90 mL/min   GFR calc Af Amer 42 (*) >90 mL/min   Comment: (NOTE)     The eGFR has been calculated using the CKD EPI equation.     This calculation has not been validated in all clinical situations.     eGFR's persistently <90 mL/min signify possible Chronic Kidney     Disease.   Anion gap 16 (*) 5 - 15  BLOOD GAS, ARTERIAL     Status: Abnormal   Collection Time    02/08/14  5:30 AM      Result Value Ref Range   FIO2 50.00     Delivery systems VENTILATOR     Mode PRESSURE REGULATED VOLUME CONTROL     VT 550     Rate 20.0     Peep/cpap 5.0     pH, Arterial 7.512 (*) 7.350 - 7.450   pCO2 arterial 39.6  35.0 - 45.0 mmHg   pO2, Arterial 128.0 (*) 80.0 - 100.0 mmHg   Bicarbonate 31.5 (*) 20.0 - 24.0 mEq/L   TCO2 28.0  0 - 100 mmol/L   Acid-Base Excess 8.0 (*) 0.0 - 2.0 mmol/L   O2 Saturation 99.1     Patient temperature 37.0     Collection site RIGHT RADIAL     Drawn by [id number]     Sample type ARTERIAL     Allens test (pass/fail) PASS  PASS  PREPARE FRESH FROZEN PLASMA     Status: None   Collection Time    02/08/14  7:00 AM      Result Value Ref Range   Unit Number [id number]     Blood Component Type THAWED PLASMA     Unit division 00     Status of Unit ALLOCATED     Transfusion Status OK TO TRANSFUSE      ABGS  Recent Labs  02/08/14 0530  PHART 7.512*  PO2ART 128.0*  TCO2 28.0  HCO3 31.5*   CULTURES Recent Results (from the past 240 hour(s))  MRSA PCR SCREENING     Status: None   Collection Time    02/06/14  6:47 PM      Result Value Ref Range Status   MRSA by PCR NEGATIVE  NEGATIVE Final   Comment:            The GeneXpert MRSA Assay (FDA     approved for NASAL specimens     only), is one component of a     comprehensive  MRSA colonization     surveillance program. It is not     intended to diagnose MRSA     infection nor to guide or     monitor treatment for     MRSA infections.   Studies/Results: Dg Abd 1 View  02/06/2014   CLINICAL DATA:  An OG tube placement  EXAM: ABDOMEN - 1 VIEW  COMPARISON:  None.  FINDINGS: Enteric tube tip and side port projects over the expected location of the gastric fundus.  Paucity of bowel gas without definite evidence of obstruction.  Vascular calcifications overlie the lower pelvis and upper thigh bilaterally.  No acute osseus abnormalities.  IMPRESSION: Enteric tube tip and side port projects over the gastric fundus.   Electronically Signed   By: Sandi Mariscal M.D.   On: 02/06/2014 20:01   Dg Chest Port 1 View  02/08/2014   CLINICAL DATA:  Respiratory failure  EXAM: PORTABLE CHEST - 1 VIEW  COMPARISON:  02/07/2014.  FINDINGS: Endotracheal tube ends between the clavicles and carina. Orogastric tube reaches the stomach.  Unchanged moderate cardiomegaly status post median sternotomy for aortic valve replacement and probable CABG.  Mild improvement in pulmonary edema. Layering small pleural effusions. The chin overlaps the left apex. No evidence of pneumothorax.  IMPRESSION: 1. Stable positioning of endotracheal and orogastric tubes. 2. Improved pulmonary edema.  Small bilateral pleural effusions.   Electronically Signed   By: Jorje Guild M.D.   On: 02/08/2014 06:59   Portable Chest Xray In Am  02/07/2014   CLINICAL DATA:  Hypoxia  EXAM: PORTABLE CHEST - 1 VIEW  COMPARISON:  February 06, 2014  FINDINGS: Endotracheal tube tip is 4.3 cm above the carina. Nasogastric tube tip and side port are below the diaphragm. No pneumothorax. There is diffuse interstitial edema. There is patchy alveolar edema in the bases. There is a moderate right effusion. There is patchy consolidation in both bases. There is cardiomegaly with pulmonary venous hypertension. Patient is status post mitral and aortic valve  replacements.  IMPRESSION: Persistent congestive heart failure. Consolidation in both lung bases may be in part due to atelectasis, although superimposed pneumonia in the bases cannot be excluded. No pneumothorax. No adenopathy. Tube positions as described.   Electronically Signed   By: Lowella Grip M.D.   On: 02/07/2014 07:03   Portable Chest Xray  02/06/2014   CLINICAL DATA:  Endotracheal tube placement  EXAM: PORTABLE CHEST - 1 VIEW  COMPARISON:  02/06/2014  FINDINGS: Endotracheal tube in good position. Congestive heart failure with bilateral edema shows mild progression. Right pleural effusion unchanged. No significant left effusion.  IMPRESSION: Satisfactory endotracheal tube position. Mild progression of pulmonary edema.   Electronically Signed   By: Franchot Gallo M.D.   On: 02/06/2014 19:40   Dg Chest Port 1 View  02/06/2014   CLINICAL DATA:  Short of breath and decreasing oxygen saturations.  EXAM: PORTABLE CHEST - 1 VIEW  COMPARISON:  12/04/2013  FINDINGS: There are changes from cardiac surgery and valve replacement,  stable. Cardiac silhouette is mildly enlarged. There is vascular congestion, irregular interstitial thickening and hazy mid and lower lung zone airspace opacity, greater on the right. Hemidiaphragms are partly obscured consistent small bilateral effusions. No pneumothorax.  IMPRESSION: Congestive heart failure   Electronically Signed   By: Lajean Manes M.D.   On: 02/06/2014 11:09    Medications:  Prior to Admission:  Prescriptions prior to admission  Medication Sig Dispense Refill  . albuterol (PROVENTIL) (2.5 MG/3ML) 0.083% nebulizer solution Take 3 mLs (2.5 mg total) by nebulization every 2 (two) hours as needed for wheezing.  75 mL  12  . amLODipine (NORVASC) 10 MG tablet Take 10 mg by mouth daily.       Marland Kitchen atorvastatin (LIPITOR) 40 MG tablet Take 40 mg by mouth daily. This Replace Zocor(Simvastatin)      . clopidogrel (PLAVIX) 75 MG tablet Take 1 tablet (75 mg total) by  mouth daily with breakfast.  30 tablet  6  . ferrous sulfate 325 (65 FE) MG EC tablet Take 325 mg by mouth daily with breakfast.      . folic acid (FOLVITE) 1 MG tablet Take 1 tablet (1 mg total) by mouth daily.  30 tablet  4  . levalbuterol (XOPENEX) 0.63 MG/3ML nebulizer solution Take 3 mLs (0.63 mg total) by nebulization every 6 (six) hours as needed for wheezing or shortness of breath.  3 mL  4  . magnesium oxide (MAG-OX) 400 (241.3 MG) MG tablet Take 1 tablet (400 mg total) by mouth daily.  30 tablet  6  . metolazone (ZAROXOLYN) 2.5 MG tablet Take 2.5 mg (1 tablet) every week as needed for swelling.  12 tablet  3  . metoprolol (LOPRESSOR) 100 MG tablet Take 1 tablet (100 mg total) by mouth 2 (two) times daily.  60 tablet  6  . Multiple Vitamin (MULTIVITAMIN WITH MINERALS) TABS Take 1 tablet by mouth daily.      . pantoprazole (PROTONIX) 40 MG tablet Take 40 mg by mouth daily.      . potassium chloride SA (K-DUR,KLOR-CON) 20 MEQ tablet Take 1 tablet (20 mEq total) by mouth daily.  90 tablet  3  . thiamine 100 MG tablet Take 1 tablet (100 mg total) by mouth daily.  30 tablet  4  . torsemide (DEMADEX) 20 MG tablet Take 3 tablets (60 mg total) by mouth 2 (two) times daily.  180 tablet  6  . warfarin (COUMADIN) 5 MG tablet Take 5-7.5 mg by mouth daily. Takes 7.$RemoveBefore'5mg'aiaxxCdKfJAcp$  on Mondays & $RemoveB'5mg'AlinqSWD$  all other days of week      . acetaminophen (TYLENOL) 500 MG tablet Take 1,000 mg by mouth daily as needed for mild pain or headache.      . loperamide (IMODIUM) 2 MG capsule Take 2 capsules (4 mg total) by mouth every 12 (twelve) hours as needed for diarrhea or loose stools.  30 capsule  0  . nitroGLYCERIN (NITROSTAT) 0.4 MG SL tablet Place 1 tablet (0.4 mg total) under the tongue every 5 (five) minutes x 3 doses as needed for chest pain.  25 tablet  3   Scheduled: . sodium chloride   Intravenous Once  . albuterol  2.5 mg Nebulization BID  . antiseptic oral rinse  7 mL Mouth Rinse BID  . antiseptic oral rinse  7 mL  Mouth Rinse 6 X Daily  . chlorhexidine  15 mL Mouth Rinse BID  . clopidogrel  75 mg Oral Q breakfast  . furosemide  80  mg Intravenous BID  . metoprolol  10 mg Intravenous 4 times per day  . nystatin   Topical BID  . pantoprazole (PROTONIX) IV  40 mg Intravenous Q24H  . piperacillin-tazobactam (ZOSYN)  IV  3.375 g Intravenous Q8H  . potassium chloride  20 mEq Oral BID  . sodium chloride  3 mL Intravenous Q12H   Continuous: . fentaNYL infusion INTRAVENOUS 25 mcg/hr (02/08/14 0600)   ZOX:WRUEAV chloride, acetaminophen, albuterol, fentaNYL, fentaNYL, LORazepam, midazolam, sodium chloride  Assesment: He was admitted with recurrent epistaxis. 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 He is chronically anticoagulated and his INR is over 8 this morning. He has had acute on chronic respiratory failure from his severe COPD and probably from pulmonary edema as well. He has severe protein calorie malnutrition. He has a history of significant alcohol abuse and it's not clear if he is still actively using alcohol. Principal Problem:   Epistaxis, recurrent Active Problems:   MITRAL VALVE REPLACEMENT, HX OF   AORTIC VALVE REPLACEMENT, HX OF   Chronic anticoagulation   Epistaxis   Protein-calorie malnutrition, severe    Plan: I am hopeful of having him extubated today. Cardiology consult.    LOS: 4 days   Mandisa Persinger L 02/08/2014, 8:29 AM

## 2014-02-08 NOTE — Progress Notes (Signed)
NUTRITION FOLLOW UP  Intervention:   RD to follow for diet advancement  Nutrition Dx:   Inadequate oral intake related to inability to eat as evidenced by NPO; ongoing  Goal:   Pt will meet >90% of estimated nutritional needs  Monitor:   Diet advancement, PO intake, labs, weight changes, I/O's  Assessment:   Pt was extubated this AM. Remains NPO. Nutritional needs re-estimated to reflect change in status. No new weight.  Labs reviewed. Noted increase in BUN/Creat. Glucose has improved.   Height: Ht Readings from Last 1 Encounters:  02/06/14 5\' 6"  (1.676 m)    Weight Status:   Wt Readings from Last 1 Encounters:  02/06/14 149 lb 14.6 oz (68 kg)    Re-estimated needs:  Kcal: 2000-2200 Protein: 92-102 grams Fluid: 2.0-2.2 L  Skin: rash to lt and rt groin   Diet Order: NPO   Intake/Output Summary (Last 24 hours) at 02/08/14 0938 Last data filed at 02/08/14 0600  Gross per 24 hour  Intake  46.75 ml  Output   1200 ml  Net -1153.25 ml    Last BM: 02/04/14   Labs:   Recent Labs Lab 02/06/14 0549 02/07/14 0421 02/08/14 0417  NA 137 140 145  K 4.2 3.7 4.1  CL 92* 97 99  CO2 34* 32 30  BUN 40* 44* 47*  CREATININE 1.32 1.39* 1.84*  CALCIUM 9.2 9.2 9.2  GLUCOSE 115* 93 109*    CBG (last 3)  No results found for this basename: GLUCAP,  in the last 72 hours  Scheduled Meds: . sodium chloride   Intravenous Once  . albuterol  2.5 mg Nebulization BID  . antiseptic oral rinse  7 mL Mouth Rinse BID  . antiseptic oral rinse  7 mL Mouth Rinse 6 X Daily  . chlorhexidine  15 mL Mouth Rinse BID  . clopidogrel  75 mg Oral Q breakfast  . furosemide  80 mg Intravenous BID  . metoprolol tartrate  100 mg Oral BID  . nystatin   Topical BID  . pantoprazole (PROTONIX) IV  40 mg Intravenous Q24H  . piperacillin-tazobactam (ZOSYN)  IV  3.375 g Intravenous Q8H  . potassium chloride  20 mEq Oral BID  . sodium chloride  3 mL Intravenous Q12H    Continuous Infusions: .  fentaNYL infusion INTRAVENOUS 25 mcg/hr (02/08/14 0600)    Deondria Puryear A. Jimmye Norman, RD, LDN Pager: [phone]

## 2014-02-08 NOTE — Consult Note (Signed)
CARDIOLOGY CONSULT NOTE  Patient ID: [name] MRN: [id number] DOB/AGE: August 29, 1945 68 y.o.  Admit date: 02/04/2014 Primary Physician HAWKINS,EDWARD L, MD  Reason for Consultation: diastolic heart failure, CAD, MVR, AVR, atrial fibrillation  HPI: Pt is a 68 yr old male with an extensive cardiac history which includes chronic diastolic heart failure, CAD/CABG, St. Jude AVR and MVR, atrial fibrillation, and COPD, maintained on Plavix, warfarin, torsemide, and metolazone. He was admitted for epistaxis on 8/2, INR 2.65, and had difficulties with oxygen by nasal cannula. On 8/4, he became more dyspneic and hypoxic and was placed on a nonrebreather, with CXR demonstrating CHF. He eventually required intubation to maintain adequate O2 sats, and was started on IV Lasix. BNP 17,258 on 8/4. On 8/5, he was started on IV Zosyn for aspiration pneumonia. He has diuresed well and was extubated earlier this morning, now on 45% Venturi mask with sats in 92% range.  INR 8.37 today, with warfarin managed by pharmacy. Receiving FFP. BUN/creatinine 47/1.84 today (1.24 on 8/3).  CXR this morning shows improved pulmonary edema with small b/l pleural effusions. Echo on 07/24/2013 demonstrated normal LV systolic function with normally functioning AV and MV.  HR had been elevated earlier this morning to 120 bpm range (atrial fibrillation), but now that he is extubated, nurse gave oral metoprolol and HR currently in 80-90 bpm range. He denies chest pain. Says breathing is much better. Denies leg swelling.   No Known Allergies  Current Facility-Administered Medications  Medication Dose Route Frequency Provider Last Rate Last Dose  . 0.9 %  sodium chloride infusion  250 mL Intravenous PRN Oswald Hillock, MD 10 mL/hr at 02/07/14 0700 250 mL at 02/07/14 0700  . 0.9 %  sodium chloride infusion   Intravenous Once Alonza Bogus, MD      . acetaminophen (TYLENOL) tablet 1,000 mg  1,000 mg Oral Daily PRN Oswald Hillock, MD      . albuterol (PROVENTIL) (2.5 MG/3ML) 0.083% nebulizer solution 2.5 mg  2.5 mg Nebulization Q2H PRN Oswald Hillock, MD   2.5 mg at 02/06/14 0435  . albuterol (PROVENTIL) (2.5 MG/3ML) 0.083% nebulizer solution 2.5 mg  2.5 mg Nebulization BID Alonza Bogus, MD   2.5 mg at 02/08/14 1740  . antiseptic oral rinse (CPC / CETYLPYRIDINIUM CHLORIDE 0.05%) solution 7 mL  7 mL Mouth Rinse BID Alonza Bogus, MD   7 mL at 02/08/14 0907  . antiseptic oral rinse (CPC / CETYLPYRIDINIUM CHLORIDE 0.05%) solution 7 mL  7 mL Mouth Rinse 6 X Daily Alonza Bogus, MD   7 mL at 02/08/14 0806  . chlorhexidine (PERIDEX) 0.12 % solution 15 mL  15 mL Mouth Rinse BID Alonza Bogus, MD   15 mL at 02/08/14 0814  . clopidogrel (PLAVIX) tablet 75 mg  75 mg Oral Q breakfast Oswald Hillock, MD   75 mg at 02/07/14 8144  . fentaNYL (SUBLIMAZE) 2,500 mcg in sodium chloride 0.9 % 250 mL (10 mcg/mL) infusion  0-400 mcg/hr Intravenous Continuous Alonza Bogus, MD 2.5 mL/hr at 02/08/14 0600 25 mcg/hr at 02/08/14 0600  . fentaNYL (SUBLIMAZE) bolus via infusion 50-100 mcg  50-100 mcg Intravenous Q1H PRN Alonza Bogus, MD      . fentaNYL (SUBLIMAZE) injection 50 mcg  50 mcg Intravenous Q2H PRN Rigoberto Noel, MD   50 mcg at 02/07/14 1830  . furosemide (LASIX) injection 80 mg  80 mg Intravenous BID Percell Miller  Emilie Rutter, MD   80 mg at 02/08/14 0806  . LORazepam (ATIVAN) injection 1 mg  1 mg Intravenous Q6H PRN Rosita Fire, MD   1 mg at 02/08/14 0116  . metoprolol (LOPRESSOR) tablet 100 mg  100 mg Oral BID Alonza Bogus, MD   100 mg at 02/08/14 1121  . midazolam (VERSED) injection 1-2 mg  1-2 mg Intravenous Q2H PRN Rigoberto Noel, MD   2 mg at 02/07/14 2012  . nystatin (MYCOSTATIN/NYSTOP) topical powder   Topical BID Alonza Bogus, MD   1 g at 02/08/14 1017  . pantoprazole (PROTONIX) injection 40 mg  40 mg Intravenous Q24H Alonza Bogus, MD   40 mg at 02/08/14 0865  . piperacillin-tazobactam (ZOSYN) IVPB 3.375 g   3.375 g Intravenous Q8H Alonza Bogus, MD   3.375 g at 02/08/14 0914  . potassium chloride 20 MEQ/15ML (10%) liquid 20 mEq  20 mEq Oral BID Alonza Bogus, MD   20 mEq at 02/08/14 1012  . sodium chloride 0.9 % injection 3 mL  3 mL Intravenous Q12H Oswald Hillock, MD   3 mL at 02/08/14 1016  . sodium chloride 0.9 % injection 3 mL  3 mL Intravenous PRN Oswald Hillock, MD        Past Medical History  Diagnosis Date  . Essential hypertension, benign   . Coronary atherosclerosis of native coronary artery     a. CABG/MVR/AVR-2003 (#25 St. Jude/#21 St. Jude); anticoagulation; b. negative stress nuclear-2005;  c. 01/2013 NSTEMI/Cath/PCI: LM nl, LAD 50p, 40-79m, D1 50ost, LCX 65m, RCA 50/74m (2.5x20 Promus DES), 50d, VG->Diag 100, VG->PDA 100, VG->OM 100, LIMA->LAD ok.  . Epistaxis     Requiring cautery & aterial ligation-09/2009  . GERD (gastroesophageal reflux disease)   . Hyperlipemia   . Abnormal LFTs     Possible cirrhosis  . Chronic anticoagulation   . Valvular heart disease     a. 2003: MVR/AVR-2003 (#25 St. Jude/#21 St. Jude);  b. 01/2013 Echo: EF 55%, Mech AVR mean grad 12, Mech MVR mean grad 6.  . Tobacco abuse     45 pack years  . Fasting hyperglycemia   . Nephrolithiasis   . Diverticulosis   . Hyponatremia   . Chronic diastolic CHF (congestive heart failure)   . Chronic renal disease   . Hemolytic anemia   . ETOH abuse   . Atrial fibrillation 01/16/2013    On coumadin DCCV 07/2013.   Marland Kitchen COPD (chronic obstructive pulmonary disease)     Past Surgical History  Procedure Laterality Date  . Endocopic sphenopalatine artery ligation & cautry    . Inguinal hernia repair      Left & right  . Coronary artery bypass graft  11/2001    [facility]  . Cardiac valve replacement  11/2001    AVR and MVR-St. Jude devices  . Cataract extraction w/phaco  01/20/2011    Procedure: CATARACT EXTRACTION PHACO AND INTRAOCULAR LENS PLACEMENT (IOC);  Surgeon: Elta Guadeloupe T. Gershon Crane;  Location: [facility];   Service: Ophthalmology;  Laterality: Right;  CDE: 8.51  . Cataract extraction w/phaco  02/03/2011    Procedure: CATARACT EXTRACTION PHACO AND INTRAOCULAR LENS PLACEMENT (IOC);  Surgeon: Elta Guadeloupe T. Gershon Crane;  Location: [facility];  Service: Ophthalmology;  Laterality: Left;  CDE:10.01  . Colonoscopy  04/05/02; 08/2011    friable anal canal hemorrhoids otherwise normal; 2 diminutive polyps excised, minimal diverticulosis noted  . Esophagogastroduodenoscopy  01/2002    Dr. Laural Golden,  submucosal esophageal lesion c/w leiomyoma  . Colonoscopy  09/02/2011    Procedure: COLONOSCOPY;  Surgeon: Daneil Dolin, MD;  Location: [facility];  Service: Endoscopy;  Laterality: N/A;  8:15  . Yag laser application Right 47/03/6282    Procedure: YAG LASER APPLICATION;  Surgeon: Elta Guadeloupe T. Gershon Crane, MD;  Location: [facility];  Service: Ophthalmology;  Laterality: Right;    History   Social History  . Marital Status: Widowed    Spouse Name: N/A    Number of Children: 1  . Years of Education: N/A   Occupational History  . Retired    Social History Main Topics  . Smoking status: Former Smoker -- 0.25 packs/day for 52 years    Types: Cigarettes    Quit date: 01/03/2014  . Smokeless tobacco: Former Systems developer    Quit date: 12/04/2013     Comment: STATES HE IS CUTTING DOWN now only 1/2 ppd (01/27/2013)  . Alcohol Use: 4.2 oz/week    6 Cans of beer, 1 Shots of liquor per week     Comment: Daily  . Drug Use: No  . Sexual Activity: No   Other Topics Concern  . Not on file   Social History Narrative   Lives in [place] by himself.  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     No family history of premature CAD in 1st degree relatives.  Prior to Admission medications   Medication Sig Start Date End Date Taking? Authorizing Provider  albuterol (PROVENTIL) (2.5 MG/3ML) 0.083% nebulizer solution Take 3 mLs (2.5 mg total) by nebulization every 2 (two) hours as needed for wheezing. 12/06/13  Yes Alonza Bogus, MD  amLODipine (NORVASC) 10 MG tablet Take 10 mg by mouth daily.  12/15/13  Yes Historical Provider, MD  atorvastatin (LIPITOR) 40 MG tablet Take 40 mg by mouth daily. This Replace Zocor(Simvastatin)   Yes Historical Provider, MD  clopidogrel (PLAVIX) 75 MG tablet Take 1 tablet (75 mg total) by mouth daily with breakfast. 07/03/13  Yes Satira Sark, MD  ferrous sulfate 325 (65 FE) MG EC tablet Take 325 mg by mouth daily with breakfast.   Yes Historical Provider, MD  folic acid (FOLVITE) 1 MG tablet Take 1 tablet (1 mg total) by mouth daily. 01/22/13  Yes Brooke O Edmisten, PA-C  levalbuterol (XOPENEX) 0.63 MG/3ML nebulizer solution Take 3 mLs (0.63 mg total) by nebulization every 6 (six) hours as needed for wheezing or shortness of breath. 01/22/13  Yes Brooke O Edmisten, PA-C  magnesium oxide (MAG-OX) 400 (241.3 MG) MG tablet Take 1 tablet (400 mg total) by mouth daily. 09/12/13  Yes Lendon Colonel, NP  metolazone (ZAROXOLYN) 2.5 MG tablet Take 2.5 mg (1 tablet) every week as needed for swelling. 01/26/14  Yes Arnoldo Lenis, MD  metoprolol (LOPRESSOR) 100 MG tablet Take 1 tablet (100 mg total) by mouth 2 (two) times daily. 07/03/13  Yes Satira Sark, MD  Multiple Vitamin (MULTIVITAMIN WITH MINERALS) TABS Take 1 tablet by mouth daily. 01/22/13  Yes Brooke O Edmisten, PA-C  pantoprazole (PROTONIX) 40 MG tablet Take 40 mg by mouth daily.   Yes Historical Provider, MD  potassium chloride SA (K-DUR,KLOR-CON) 20 MEQ tablet Take 1 tablet (20 mEq total) by mouth daily. 12/18/13  Yes Arnoldo Lenis, MD  thiamine 100 MG tablet Take 1 tablet (100 mg total) by mouth daily. 01/22/13  Yes Brooke O Edmisten, PA-C  torsemide (DEMADEX) 20 MG tablet  Take 3 tablets (60 mg total) by mouth 2 (two) times daily. 01/26/14  Yes Arnoldo Lenis, MD  warfarin (COUMADIN) 5 MG tablet Take 5-7.5 mg by mouth daily. Takes 7.5mg  on Mondays & 5mg  all other days of week   Yes Historical Provider, MD    acetaminophen (TYLENOL) 500 MG tablet Take 1,000 mg by mouth daily as needed for mild pain or headache.    Historical Provider, MD  loperamide (IMODIUM) 2 MG capsule Take 2 capsules (4 mg total) by mouth every 12 (twelve) hours as needed for diarrhea or loose stools. 12/06/13   Alonza Bogus, MD  nitroGLYCERIN (NITROSTAT) 0.4 MG SL tablet Place 1 tablet (0.4 mg total) under the tongue every 5 (five) minutes x 3 doses as needed for chest pain. 01/31/13   Rogelia Mire, NP     Review of systems complete and found to be negative unless listed above in HPI     Physical exam Blood pressure 118/60, pulse 133, temperature 98.5 F (36.9 C), temperature source Oral, resp. rate 20, height 5\' 6"  (1.676 m), weight 149 lb 14.6 oz (68 kg), SpO2 96.00%. General: NAD, lethargic, on Venturi mask Neck: No JVD. Lungs: Faint rales at bases, diminished sounds at bases. CV: Nondisplaced PMI. Irregular rhythm, crisp prosthetic valve sounds appreciated, no S3, no murmur.  Trace peripheral edema.   Abdomen: Soft, nontender, no hepatosplenomegaly, no distention.  Neurologic: Alert and oriented x 3. Lethargic. Psych: Normal affect. Extremities: No clubbing or cyanosis.  HEENT: Normal.   Labs:   Lab Results  Component Value Date   WBC 8.6 02/08/2014   HGB 9.7* 02/08/2014   HCT 32.1* 02/08/2014   MCV 91.7 02/08/2014   PLT 260 02/08/2014    Recent Labs Lab 02/05/14 0557  02/08/14 0417  NA 135*  < > 145  K 3.9  < > 4.1  CL 91*  < > 99  CO2 32  < > 30  BUN 40*  < > 47*  CREATININE 1.24  < > 1.84*  CALCIUM 9.4  < > 9.2  PROT 8.0  --   --   BILITOT 0.9  --   --   ALKPHOS 125*  --   --   ALT 10  --   --   AST 28  --   --   GLUCOSE 110*  < > 109*  < > = values in this interval not displayed. Lab Results  Component Value Date   CKTOTAL 206 09/04/2009   CKMB 3.5 09/04/2009   TROPONINI 0.84* 12/01/2013    Lab Results  Component Value Date   CHOL 147 01/28/2013   CHOL 171 05/18/2011   CHOL 191  02/24/2010   Lab Results  Component Value Date   HDL 64 01/28/2013   HDL 55 05/18/2011   HDL 71 02/24/2010   Lab Results  Component Value Date   LDLCALC 74 01/28/2013   LDLCALC 106* 05/18/2011   LDLCALC 106* 02/24/2010   Lab Results  Component Value Date   TRIG 77 12/04/2013   TRIG 104 12/01/2013   TRIG 63 11/28/2013   Lab Results  Component Value Date   CHOLHDL 2.3 01/28/2013   CHOLHDL 3.1 05/18/2011   CHOLHDL 2.7 Ratio 02/24/2010   No results found for this basename: LDLDIRECT         Studies: Dg Abd 1 View  02/06/2014   CLINICAL DATA:  An OG tube placement  EXAM: ABDOMEN - 1 VIEW  COMPARISON:  None.  FINDINGS: Enteric tube tip and side port projects over the expected location of the gastric fundus.  Paucity of bowel gas without definite evidence of obstruction.  Vascular calcifications overlie the lower pelvis and upper thigh bilaterally.  No acute osseus abnormalities.  IMPRESSION: Enteric tube tip and side port projects over the gastric fundus.   Electronically Signed   By: Sandi Mariscal M.D.   On: 02/06/2014 20:01   Dg Chest Port 1 View  02/08/2014   CLINICAL DATA:  Respiratory failure  EXAM: PORTABLE CHEST - 1 VIEW  COMPARISON:  02/07/2014.  FINDINGS: Endotracheal tube ends between the clavicles and carina. Orogastric tube reaches the stomach.  Unchanged moderate cardiomegaly status post median sternotomy for aortic valve replacement and probable CABG.  Mild improvement in pulmonary edema. Layering small pleural effusions. The chin overlaps the left apex. No evidence of pneumothorax.  IMPRESSION: 1. Stable positioning of endotracheal and orogastric tubes. 2. Improved pulmonary edema.  Small bilateral pleural effusions.   Electronically Signed   By: Jorje Guild M.D.   On: 02/08/2014 06:59   Portable Chest Xray In Am  02/07/2014   CLINICAL DATA:  Hypoxia  EXAM: PORTABLE CHEST - 1 VIEW  COMPARISON:  February 06, 2014  FINDINGS: Endotracheal tube tip is 4.3 cm above the carina.  Nasogastric tube tip and side port are below the diaphragm. No pneumothorax. There is diffuse interstitial edema. There is patchy alveolar edema in the bases. There is a moderate right effusion. There is patchy consolidation in both bases. There is cardiomegaly with pulmonary venous hypertension. Patient is status post mitral and aortic valve replacements.  IMPRESSION: Persistent congestive heart failure. Consolidation in both lung bases may be in part due to atelectasis, although superimposed pneumonia in the bases cannot be excluded. No pneumothorax. No adenopathy. Tube positions as described.   Electronically Signed   By: Lowella Grip M.D.   On: 02/07/2014 07:03   Portable Chest Xray  02/06/2014   CLINICAL DATA:  Endotracheal tube placement  EXAM: PORTABLE CHEST - 1 VIEW  COMPARISON:  02/06/2014  FINDINGS: Endotracheal tube in good position. Congestive heart failure with bilateral edema shows mild progression. Right pleural effusion unchanged. No significant left effusion.  IMPRESSION: Satisfactory endotracheal tube position. Mild progression of pulmonary edema.   Electronically Signed   By: Franchot Gallo M.D.   On: 02/06/2014 19:40    ASSESSMENT AND PLAN:  1. Acute on chronic respiratory failure secondary to both severe COPD and diastolic heart failure: Appears to have improved considerably with IV Lasix with 2.3 liters output in past 48 hrs. Creatinine elevated to 1.84 today. Will switch IV Lasix to oral torsemide with reduction from outpatient dose to 40 mg bid as pt appearing euvolemia. BP controlled today.   2. CAD/CABG: Symptomatically stable. Receiving FFP. Will hold resuming Plavix. No ASA given concomitant warfarin use. Continue metoprolol and Lipitor. Has history of prior CABG 2003, with most recent cath 01/2013 in setting of NSTEMI: LM patent, LAD prox 50% and 50% mid, D1 50%, LCX 30% mid, RCA 50% mid with more distal 99% lesion. SVG to diag occluded, SVG to PDA occluded, SVG to OM  occluded, LIMA to LAD patent. The RCA received a DES. Nuclear MPI study in 08/2013 demonstrated moderate lateral wall ischemia with a small area of inferior scar and peri-infarct ischemia. No obvious signs of ischemia at present. Will defer further workup to outpatient setting with Dr. Harl Bowie (primary cardiologist).  3. Atrial fibrillation: Now that pt is extubated, I  presume HR will be more readily controlled. He has resumed oral metoprolol today. INR supratherapeutic, with pharmacy managing warfarin. Receiving FFP. Had relatively recent event monitor which demonstrated avg HR was controlled, thus diltiazem not initiated. I see no reason to add this at the present time.  4. [employer] AV/MV: Normally functioning by echo in 07/2013. INR supratherapeutic, with pharmacy managing warfarin. Receiving FFP.  5. Essential HTN: BP normal without amlodipine, and in context of heart failure on IV diuretics. Would hold off on starting amlodipine.  6. Hyperlipidemia: Resume Lipitor.  7. Aspiration pneumonia: Continue Zosyn.    Signed: Kate Sable, M.D., F.A.C.C.  02/08/2014, 11:35 AM

## 2014-02-08 NOTE — Progress Notes (Signed)
Pt weaned from 0650-0820  Pt extubated per Dr Luan Pulling. Pt with 45% venti mask with sats =90% Hr 115

## 2014-02-09 LAB — CBC WITH DIFFERENTIAL/PLATELET
BASOS ABS: 0 10*3/uL (ref 0.0–0.1)
BASOS PCT: 0 % (ref 0–1)
EOS PCT: 13 % — AB (ref 0–5)
Eosinophils Absolute: 0.8 10*3/uL — ABNORMAL HIGH (ref 0.0–0.7)
HCT: 31.1 % — ABNORMAL LOW (ref 39.0–52.0)
Hemoglobin: 9.3 g/dL — ABNORMAL LOW (ref 13.0–17.0)
Lymphocytes Relative: 2 % — ABNORMAL LOW (ref 12–46)
Lymphs Abs: 0.2 10*3/uL — ABNORMAL LOW (ref 0.7–4.0)
MCH: 28.5 pg (ref 26.0–34.0)
MCHC: 29.9 g/dL — AB (ref 30.0–36.0)
MCV: 95.4 fL (ref 78.0–100.0)
MONO ABS: 0.4 10*3/uL (ref 0.1–1.0)
Monocytes Relative: 6 % (ref 3–12)
NEUTROS ABS: 5.3 10*3/uL (ref 1.7–7.7)
Neutrophils Relative %: 79 % — ABNORMAL HIGH (ref 43–77)
Platelets: 250 10*3/uL (ref 150–400)
RBC: 3.26 MIL/uL — ABNORMAL LOW (ref 4.22–5.81)
RDW: 17.8 % — AB (ref 11.5–15.5)
WBC: 6.7 10*3/uL (ref 4.0–10.5)

## 2014-02-09 LAB — BASIC METABOLIC PANEL
Anion gap: 14 (ref 5–15)
BUN: 48 mg/dL — ABNORMAL HIGH (ref 6–23)
CO2: 32 mEq/L (ref 19–32)
CREATININE: 1.91 mg/dL — AB (ref 0.50–1.35)
Calcium: 9.2 mg/dL (ref 8.4–10.5)
Chloride: 97 mEq/L (ref 96–112)
GFR, EST AFRICAN AMERICAN: 40 mL/min — AB (ref >90)
GFR, EST NON AFRICAN AMERICAN: 34 mL/min — AB (ref >90)
Glucose, Bld: 79 mg/dL (ref 70–99)
Potassium: 4.5 mEq/L (ref 3.7–5.3)
Sodium: 143 mEq/L (ref 137–147)

## 2014-02-09 LAB — PREPARE FRESH FROZEN PLASMA
UNIT DIVISION: 0
Unit division: 0

## 2014-02-09 LAB — PROTIME-INR
INR: 6.71 — AB (ref 0.00–1.49)
Prothrombin Time: 58.4 seconds — ABNORMAL HIGH (ref 11.6–15.2)

## 2014-02-09 MED ORDER — POTASSIUM CHLORIDE CRYS ER 20 MEQ PO TBCR
20.0000 meq | EXTENDED_RELEASE_TABLET | Freq: Two times a day (BID) | ORAL | Status: DC
  Administered 2014-02-09 – 2014-02-12 (×6): 20 meq via ORAL
  Filled 2014-02-09 (×6): qty 1

## 2014-02-09 NOTE — Progress Notes (Signed)
  The patient was seen and examined, and I agree with the assessment and plan as documented above, with modifications as noted below. Pt says he feels well today, denying chest pain and worsening shortness of breath.  1. Acute on chronic respiratory failure secondary to both severe COPD and diastolic heart failure: Oral torsemide 40 mg bid initiated yesterday (reduction from outpatient dose). Creatinine remains elevated at 1.91 today. Patient appears euvolemic. BP low normal.  2. CAD/CABG: Symptomatically stable. Continue Plavix. No ASA given concomitant warfarin use. Continue metoprolol and Lipitor. Has history of prior CABG 2003, with most recent cath 01/2013 in setting of NSTEMI: LM patent, LAD prox 50% and 50% mid, D1 50%, LCX 30% mid, RCA 50% mid with more distal 99% lesion. SVG to diag occluded, SVG to PDA occluded, SVG to OM occluded, LIMA to LAD patent. The RCA received a DES.  Nuclear MPI study in 08/2013 demonstrated moderate lateral wall ischemia with a small area of inferior scar and peri-infarct ischemia. No obvious signs of ischemia at present. Will defer further workup to outpatient setting with Dr. Harl Bowie (primary cardiologist).   3. Atrial fibrillation: HR high normal, and elevates quickly with any amount of exertion. He was resumed on oral metoprolol on 8/6. INR remains supratherapeutic, with pharmacy managing warfarin. Received FFP on 8/6.  Had relatively recent event monitor which demonstrated avg HR was controlled, thus diltiazem not initiated at that time.  Given soft BP, unable to start diltiazem at the present time. However, could consider adding this over weekend if HR becomes elevated (suggest 30 mg bid).   4. [employer] AV/MV: Normally functioning by echo in 07/2013. INR supratherapeutic, with pharmacy managing warfarin. Received FFP on 8/6.   5. Essential HTN: BP low normal without amlodipine, and in context of heart failure on oraldiuretics. Would hold off on resuming  amlodipine.   6. Hyperlipidemia: Continue Lipitor.   7. Aspiration pneumonia: Continue Zosyn.

## 2014-02-09 NOTE — Progress Notes (Signed)
Subjective: [name] was able to be successfully extubated yesterday and has done better. [name] still has some episodes of tachycardia but it's mostly associated with exertion. His rate is better controlled on by mouth Lopressor. [name] says his breathing is okay. [name] has no other new complaints.  Objective: Vital signs in last 24 hours: Temp:  [97.4 F (36.3 C)-98.5 F (36.9 C)] 97.7 F (36.5 C) (08/07 0400) Pulse Rate:  [46-133] 93 (08/07 0600) Resp:  [13-28] 20 (08/07 0600) BP: (69-128)/(34-80) 118/53 mmHg (08/07 0600) SpO2:  [67 %-99 %] 95 % (08/07 0700) FiO2 (%):  [40 %-55 %] 40 % (08/07 0700) Weight:  [68.1 kg (150 lb 2.1 oz)] 68.1 kg (150 lb 2.1 oz) (08/07 0500) Weight change:  Last BM Date: 02/04/14  Intake/Output from previous day: 08/06 0701 - 08/07 0700 In: 1067.5 [I.V.:480; Blood:387.5; IV Piggyback:200] Out: 1000 [Urine:1000]  PHYSICAL EXAM General appearance: alert, cooperative and mild distress Resp: clear to auscultation bilaterally Cardio: [name] is in atrial fibrillation with well-controlled ventricular response and 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 GI: soft, non-tender; bowel sounds normal; no masses,  no organomegaly Extremities: extremities normal, atraumatic, no cyanosis or edema  Lab Results:  Results for orders placed during the hospital encounter of 02/04/14 (from the past 48 hour(s))  PROTIME-INR     Status: Abnormal   Collection Time    02/08/14  4:17 AM      Result Value Ref Range   Prothrombin Time 69.4 (*) 11.6 - 15.2 seconds   Comment: CALLED TO OLGA MCCLOUD RN ON [id number] AT 0550 BY RESSEGGER R   INR 8.37 (*) 0.00 - 1.49   Comment: RESULT REPEATED AND VERIFIED     CRITICAL RESULT CALLED TO, READ BACK BY AND VERIFIED WITH:     OLGA MCCLOUD RN ON [id number] AT 0550 BY RESSEGGER R  CBC WITH DIFFERENTIAL     Status: Abnormal   Collection Time    02/08/14  4:17 AM      Result Value Ref Range   WBC 8.6  4.0 - 10.5 K/uL   RBC 3.50 (*) 4.22 - 5.81 MIL/uL    Hemoglobin 9.7 (*) 13.0 - 17.0 g/dL   HCT 32.1 (*) 39.0 - 52.0 %   MCV 91.7  78.0 - 100.0 fL   MCH 27.7  26.0 - 34.0 pg   MCHC 30.2  30.0 - 36.0 g/dL   RDW 17.9 (*) 11.5 - 15.5 %   Platelets 260  150 - 400 K/uL   Comment: DELTA CHECK NOTED   Neutrophils Relative % 88 (*) 43 - 77 %   Neutro Abs 7.5  1.7 - 7.7 K/uL   Lymphocytes Relative 3 (*) 12 - 46 %   Lymphs Abs 0.3 (*) 0.7 - 4.0 K/uL   Monocytes Relative 4  3 - 12 %   Monocytes Absolute 0.3  0.1 - 1.0 K/uL   Eosinophils Relative 5  0 - 5 %   Eosinophils Absolute 0.4  0.0 - 0.7 K/uL   Basophils Relative 0  0 - 1 %   Basophils Absolute 0.0  0.0 - 0.1 K/uL  BASIC METABOLIC PANEL     Status: Abnormal   Collection Time    02/08/14  4:17 AM      Result Value Ref Range   Sodium 145  137 - 147 mEq/[name]   Potassium 4.1  3.7 - 5.3 mEq/[name]   Chloride 99  96 - 112 mEq/[name]   CO2 30  19 -  32 mEq/[name]   Glucose, Bld 109 (*) 70 - 99 mg/dL   BUN 47 (*) 6 - 23 mg/dL   Creatinine, Ser 5.66 (*) 0.50 - 1.35 mg/dL   Calcium 9.2  8.4 - 90.5 mg/dL   GFR calc non Af Amer 36 (*) >90 mL/min   GFR calc Af Amer 42 (*) >90 mL/min   Comment: (NOTE)     The eGFR has been calculated using the CKD EPI equation.     This calculation has not been validated in all clinical situations.     eGFR's persistently <90 mL/min signify possible Chronic Kidney     Disease.   Anion gap 16 (*) 5 - 15  BLOOD GAS, ARTERIAL     Status: Abnormal   Collection Time    02/08/14  5:30 AM      Result Value Ref Range   FIO2 50.00     Delivery systems VENTILATOR     Mode PRESSURE REGULATED VOLUME CONTROL     VT 550     Rate 20.0     Peep/cpap 5.0     pH, Arterial 7.512 (*) 7.350 - 7.450   pCO2 arterial 39.6  35.0 - 45.0 mmHg   pO2, Arterial 128.0 (*) 80.0 - 100.0 mmHg   Bicarbonate 31.5 (*) 20.0 - 24.0 mEq/[name]   TCO2 28.0  0 - 100 mmol/[name]   Acid-Base Excess 8.0 (*) 0.0 - 2.0 mmol/[name]   O2 Saturation 99.1     Patient temperature 37.0     Collection site RIGHT RADIAL     Drawn by  [id number]     Sample type ARTERIAL     Allens test (pass/fail) PASS  PASS  PREPARE FRESH FROZEN PLASMA     Status: None   Collection Time    02/08/14  7:00 AM      Result Value Ref Range   Unit Number [id number]     Blood Component Type THAWED PLASMA     Unit division 00     Status of Unit ISSUED,FINAL     Transfusion Status OK TO TRANSFUSE     Unit Number [id number]     Blood Component Type THAWED PLASMA     Unit division 00     Status of Unit ISSUED,FINAL     Transfusion Status OK TO TRANSFUSE    PROTIME-INR     Status: Abnormal   Collection Time    02/09/14  4:10 AM      Result Value Ref Range   Prothrombin Time 58.4 (*) 11.6 - 15.2 seconds   Comment: RESULT REPEATED AND VERIFIED   INR 6.71 (*) 0.00 - 1.49   Comment: RESULT REPEATED AND VERIFIED     CRITICAL RESULT CALLED TO, READ BACK BY AND VERIFIED WITH:     SHERI HAMMOCK RN ON [id number] AT 0625 BY RESSEGGER R  CBC WITH DIFFERENTIAL     Status: Abnormal   Collection Time    02/09/14  4:10 AM      Result Value Ref Range   WBC 6.7  4.0 - 10.5 K/uL   RBC 3.26 (*) 4.22 - 5.81 MIL/uL   Hemoglobin 9.3 (*) 13.0 - 17.0 g/dL   HCT 56.5 (*) 49.9 - 67.9 %   MCV 95.4  78.0 - 100.0 fL   MCH 28.5  26.0 - 34.0 pg   MCHC 29.9 (*) 30.0 - 36.0 g/dL   RDW 80.9 (*) 13.8 - 39.1 %   Platelets 250  150 - 400  K/uL   Neutrophils Relative % 79 (*) 43 - 77 %   Neutro Abs 5.3  1.7 - 7.7 K/uL   Lymphocytes Relative 2 (*) 12 - 46 %   Lymphs Abs 0.2 (*) 0.7 - 4.0 K/uL   Monocytes Relative 6  3 - 12 %   Monocytes Absolute 0.4  0.1 - 1.0 K/uL   Eosinophils Relative 13 (*) 0 - 5 %   Eosinophils Absolute 0.8 (*) 0.0 - 0.7 K/uL   Basophils Relative 0  0 - 1 %   Basophils Absolute 0.0  0.0 - 0.1 K/uL  BASIC METABOLIC PANEL     Status: Abnormal   Collection Time    02/09/14  4:10 AM      Result Value Ref Range   Sodium 143  137 - 147 mEq/[name]   Potassium 4.5  3.7 - 5.3 mEq/[name]   Chloride 97  96 - 112 mEq/[name]   CO2 32  19 - 32 mEq/[name]   Glucose, Bld  79  70 - 99 mg/dL   BUN 48 (*) 6 - 23 mg/dL   Creatinine, Ser 1.91 (*) 0.50 - 1.35 mg/dL   Calcium 9.2  8.4 - 10.5 mg/dL   GFR calc non Af Amer 34 (*) >90 mL/min   GFR calc Af Amer 40 (*) >90 mL/min   Comment: (NOTE)     The eGFR has been calculated using the CKD EPI equation.     This calculation has not been validated in all clinical situations.     eGFR's persistently <90 mL/min signify possible Chronic Kidney     Disease.   Anion gap 14  5 - 15    ABGS  Recent Labs  02/08/14 0530  PHART 7.512*  PO2ART 128.0*  TCO2 28.0  HCO3 31.5*   CULTURES Recent Results (from the past 240 hour(s))  MRSA PCR SCREENING     Status: None   Collection Time    02/06/14  6:47 PM      Result Value Ref Range Status   MRSA by PCR NEGATIVE  NEGATIVE Final   Comment:            The GeneXpert MRSA Assay (FDA     approved for NASAL specimens     only), is one component of a     comprehensive MRSA colonization     surveillance program. It is not     intended to diagnose MRSA     infection nor to guide or     monitor treatment for     MRSA infections.   Studies/Results: Dg Chest Port 1 View  02/08/2014   CLINICAL DATA:  Respiratory failure  EXAM: PORTABLE CHEST - 1 VIEW  COMPARISON:  02/07/2014.  FINDINGS: Endotracheal tube ends between the clavicles and carina. Orogastric tube reaches the stomach.  Unchanged moderate cardiomegaly status post median sternotomy for aortic valve replacement and probable CABG.  Mild improvement in pulmonary edema. Layering small pleural effusions. The chin overlaps the left apex. No evidence of pneumothorax.  IMPRESSION: 1. Stable positioning of endotracheal and orogastric tubes. 2. Improved pulmonary edema.  Small bilateral pleural effusions.   Electronically Signed   By: Jorje Guild M.D.   On: 02/08/2014 06:59    Medications:  Prior to Admission:  Prescriptions prior to admission  Medication Sig Dispense Refill  . albuterol (PROVENTIL) (2.5 MG/3ML) 0.083%  nebulizer solution Take 3 mLs (2.5 mg total) by nebulization every 2 (two) hours as needed for wheezing.  75  mL  12  . amLODipine (NORVASC) 10 MG tablet Take 10 mg by mouth daily.       Marland Kitchen atorvastatin (LIPITOR) 40 MG tablet Take 40 mg by mouth daily. This Replace Zocor(Simvastatin)      . clopidogrel (PLAVIX) 75 MG tablet Take 1 tablet (75 mg total) by mouth daily with breakfast.  30 tablet  6  . ferrous sulfate 325 (65 FE) MG EC tablet Take 325 mg by mouth daily with breakfast.      . folic acid (FOLVITE) 1 MG tablet Take 1 tablet (1 mg total) by mouth daily.  30 tablet  4  . levalbuterol (XOPENEX) 0.63 MG/3ML nebulizer solution Take 3 mLs (0.63 mg total) by nebulization every 6 (six) hours as needed for wheezing or shortness of breath.  3 mL  4  . magnesium oxide (MAG-OX) 400 (241.3 MG) MG tablet Take 1 tablet (400 mg total) by mouth daily.  30 tablet  6  . metolazone (ZAROXOLYN) 2.5 MG tablet Take 2.5 mg (1 tablet) every week as needed for swelling.  12 tablet  3  . metoprolol (LOPRESSOR) 100 MG tablet Take 1 tablet (100 mg total) by mouth 2 (two) times daily.  60 tablet  6  . Multiple Vitamin (MULTIVITAMIN WITH MINERALS) TABS Take 1 tablet by mouth daily.      . pantoprazole (PROTONIX) 40 MG tablet Take 40 mg by mouth daily.      . potassium chloride SA (K-DUR,KLOR-CON) 20 MEQ tablet Take 1 tablet (20 mEq total) by mouth daily.  90 tablet  3  . thiamine 100 MG tablet Take 1 tablet (100 mg total) by mouth daily.  30 tablet  4  . torsemide (DEMADEX) 20 MG tablet Take 3 tablets (60 mg total) by mouth 2 (two) times daily.  180 tablet  6  . warfarin (COUMADIN) 5 MG tablet Take 5-7.5 mg by mouth daily. Takes 7.$RemoveBefore'5mg'sPDjsjhBXRWLW$  on Mondays & $RemoveB'5mg'fzYBdobz$  all other days of week      . acetaminophen (TYLENOL) 500 MG tablet Take 1,000 mg by mouth daily as needed for mild pain or headache.      . loperamide (IMODIUM) 2 MG capsule Take 2 capsules (4 mg total) by mouth every 12 (twelve) hours as needed for diarrhea or loose  stools.  30 capsule  0  . nitroGLYCERIN (NITROSTAT) 0.4 MG SL tablet Place 1 tablet (0.4 mg total) under the tongue every 5 (five) minutes x 3 doses as needed for chest pain.  25 tablet  3   Scheduled: . sodium chloride   Intravenous Once  . albuterol  2.5 mg Nebulization BID  . antiseptic oral rinse  7 mL Mouth Rinse BID  . antiseptic oral rinse  7 mL Mouth Rinse 6 X Daily  . atorvastatin  40 mg Oral q1800  . chlorhexidine  15 mL Mouth Rinse BID  . clopidogrel  75 mg Oral Q breakfast  . metoprolol tartrate  100 mg Oral BID  . nystatin   Topical BID  . pantoprazole (PROTONIX) IV  40 mg Intravenous Q24H  . piperacillin-tazobactam (ZOSYN)  IV  3.375 g Intravenous Q8H  . potassium chloride  20 mEq Oral BID  . potassium chloride  20 mEq Oral BID  . sodium chloride  3 mL Intravenous Q12H  . torsemide  40 mg Oral BID   Continuous: . fentaNYL infusion INTRAVENOUS 25 mcg/hr (02/08/14 0600)   ZOX:WRUEAV chloride, acetaminophen, albuterol, fentaNYL, fentaNYL, LORazepam, midazolam, sodium chloride  Assesment: [name] was admitted  with recurrent epistaxis. [name] is chronically anticoagulated because of his valves. [name] then developed acute pulmonary edema. This was despite not receiving any IV fluids and [name] denied having any chest pain. [name] developed rapid change in his INR. [name] has had multiple similar episodes with acute pulmonary edema with very little warning Principal Problem:   Epistaxis, recurrent Active Problems:   CHRONIC OBSTRUCTIVE PULMONARY DISEASE   MITRAL VALVE REPLACEMENT, HX OF   AORTIC VALVE REPLACEMENT, HX OF   Chronic anticoagulation   Coronary atherosclerosis of unspecified type of vessel, native or graft   Acute on chronic diastolic CHF (congestive heart failure), NYHA class 4   Alcohol abuse   Atrial fibrillation with rapid ventricular response   Acute-on-chronic respiratory failure   Epistaxis   Protein-calorie malnutrition, severe    Plan: Because of his previous problems  I'm going to leave him in the intensive care unit today and probably move him tomorrow.    LOS: 5 days   [name] 02/09/2014, 8:37 AM

## 2014-02-09 NOTE — Progress Notes (Signed)
Consulting cardiologist:Koneswaran, Jamesetta So MD Primary Cardiologist: Carlyle Dolly MD  Subjective:    No complaints of recurrent bleeding, breathing better. No complaints of pain.   Objective:   Temp:  [97.4 F (36.3 C)-98.5 F (36.9 C)] 97.7 F (36.5 C) (08/07 0400) Pulse Rate:  [46-133] 93 (08/07 0600) Resp:  [13-28] 20 (08/07 0600) BP: (69-128)/(34-80) 118/53 mmHg (08/07 0600) SpO2:  [67 %-99 %] 95 % (08/07 0700) FiO2 (%):  [40 %-55 %] 40 % (08/07 0700) Weight:  [150 lb 2.1 oz (68.1 kg)] 150 lb 2.1 oz (68.1 kg) (08/07 0500) Last BM Date: 02/04/14  Filed Weights   02/04/14 2142 02/06/14 2000 02/09/14 0500  Weight: 153 lb (69.4 kg) 149 lb 14.6 oz (68 kg) 150 lb 2.1 oz (68.1 kg)    Intake/Output Summary (Last 24 hours) at 02/09/14 0802 Last data filed at 02/08/14 2300  Gross per 24 hour  Intake 1067.5 ml  Output   1000 ml  Net   67.5 ml    Telemetry: Atrial fibrillation rates 80's to 100's.   Exam:  General: No acute distress.  HEENT: Conjunctiva and lids normal, oropharynx clear.  Lungs: Mild crackles with inspiration. No wheezes. No coughing.   Cardiac: No elevated JVP or bruits. IRRR, crisp mechanical click, no gallop or rub.   Abdomen: Hyperactive bowel sounds,  nontender, nondistended.  Extremities: No pitting edema, distal pulses full. Venous stasis skin thickening,ruddy appearance.   Neuropsychiatric: Alert and oriented x3, affect appropriate.   Lab Results:  Basic Metabolic Panel:  Recent Labs Lab 02/07/14 0421 02/08/14 0417 02/09/14 0410  NA 140 145 143  K 3.7 4.1 4.5  CL 97 99 97  CO2 32 30 32  GLUCOSE 93 109* 79  BUN 44* 47* 48*  CREATININE 1.39* 1.84* 1.91*  CALCIUM 9.2 9.2 9.2    Liver Function Tests:  Recent Labs Lab 02/05/14 0557  AST 28  ALT 10  ALKPHOS 125*  BILITOT 0.9  PROT 8.0  ALBUMIN 3.8    CBC:  Recent Labs Lab 02/07/14 0421 02/08/14 0417 02/09/14 0410  WBC 6.6 8.6 6.7  HGB 9.0* 9.7* 9.3*  HCT  28.9* 32.1* 31.1*  MCV 92.0 91.7 95.4  PLT 194 260 250    BNP:  Recent Labs  08/14/13 1637 11/26/13 1826 02/06/14 0549  PROBNP 5977.0* 12192.0* 17258.0*    Coagulation:  Recent Labs Lab 02/07/14 0421 02/08/14 0417 02/09/14 0410  INR 6.21* 8.37* 6.71*    Radiology: Dg Chest Port 1 View  02/08/2014   CLINICAL DATA:  Respiratory failure  EXAM: PORTABLE CHEST - 1 VIEW  COMPARISON:  02/07/2014.  FINDINGS: Endotracheal tube ends between the clavicles and carina. Orogastric tube reaches the stomach.  Unchanged moderate cardiomegaly status post median sternotomy for aortic valve replacement and probable CABG.  Mild improvement in pulmonary edema. Layering small pleural effusions. The chin overlaps the left apex. No evidence of pneumothorax.  IMPRESSION: 1. Stable positioning of endotracheal and orogastric tubes. 2. Improved pulmonary edema.  Small bilateral pleural effusions.   Electronically Signed   By: Jorje Guild M.D.   On: 02/08/2014 06:59      Medications:   Scheduled Medications: . sodium chloride   Intravenous Once  . albuterol  2.5 mg Nebulization BID  . antiseptic oral rinse  7 mL Mouth Rinse BID  . antiseptic oral rinse  7 mL Mouth Rinse 6 X Daily  . atorvastatin  40 mg Oral q1800  . chlorhexidine  15 mL Mouth Rinse  BID  . clopidogrel  75 mg Oral Q breakfast  . metoprolol tartrate  100 mg Oral BID  . nystatin   Topical BID  . pantoprazole (PROTONIX) IV  40 mg Intravenous Q24H  . piperacillin-tazobactam (ZOSYN)  IV  3.375 g Intravenous Q8H  . potassium chloride  20 mEq Oral BID  . sodium chloride  3 mL Intravenous Q12H  . torsemide  40 mg Oral BID    Infusions: . fentaNYL infusion INTRAVENOUS 25 mcg/hr (02/08/14 0600)    PRN Medications: sodium chloride, acetaminophen, albuterol, fentaNYL, fentaNYL, LORazepam, midazolam, sodium chloride   Assessment and Plan:   1. Diastolic CHF: Had diuresed well and is breathing much better. No evidence of LEE, but  did not have very' much on admission. Wt is down 3-4 lbs and has been transitioned to po torsemide 40 mg BID. Creatinine is 1.91,( trending upwards from admtitsion of 1.39) with CO2 32.  Overall clinically better. Most recent echo in January of  2015 EF of 50-55%, with normal appearing mechanical AoV and  MV. LA moderately dilated.  Remains on venti mask.   2. CAD with hx of CABG: No complaints of chest pain. Plavix continues, but ASA has been discontinued. No planned ischemic work up at this time.   3. Atrial fib: Heart rate is labile rates in the 80's to low 100;s. Metoprolol dose of 100 mg BID. No wheezes are noted on exam. Warfarin had been on hold with epistaxis and supra-therapeutic INR of 6.71]. Recieved FFP X 2.  No further bleeding over last 24 hours.   4. Acute Respiratory Failure: Pulmonary edema is improved, with extubation yesterday. He continues to have some dyspnea, but is now on venti-mask.  Treatment for aspiration pneumonia continues per Dr. Luan Pulling.   5. Hypertension: Stable. Amlodipine is on hold.       Phill Myron. Jocelyn Nold NP  02/09/2014, 8:02 AM

## 2014-02-09 NOTE — Progress Notes (Signed)
ANTIBIOTIC CONSULT NOTE - follow up  Pharmacy Consult for Zosyn Indication: aspiration pneumonia  No Known Allergies  Patient Measurements: Height: 5\' 6"  (167.6 cm) Weight: 150 lb 2.1 oz (68.1 kg) IBW/kg (Calculated) : 63.8  Vital Signs: Temp: 97.7 F (36.5 C) (08/07 0400) Temp src: Axillary (08/07 0400) BP: 118/53 mmHg (08/07 0600) Pulse Rate: 93 (08/07 0600) Intake/Output from previous day: 08/06 0701 - 08/07 0700 In: 1067.5 [I.V.:480; Blood:387.5; IV Piggyback:200] Out: 1000 [Urine:1000] Intake/Output from this shift:    Labs:  Recent Labs  02/07/14 0421 02/08/14 0417 02/09/14 0410  WBC 6.6 8.6 6.7  HGB 9.0* 9.7* 9.3*  PLT 194 260 250  CREATININE 1.39* 1.84* 1.91*   Estimated Creatinine Clearance: 33.4 ml/min (by C-G formula based on Cr of 1.91). No results found for this basename: VANCOTROUGH, Corlis Leak, VANCORANDOM, Raymond, GENTPEAK, GENTRANDOM, TOBRATROUGH, TOBRAPEAK, TOBRARND, AMIKACINPEAK, AMIKACINTROU, AMIKACIN,  in the last 72 hours   Microbiology: Recent Results (from the past 720 hour(s))  MRSA PCR SCREENING     Status: None   Collection Time    02/06/14  6:47 PM      Result Value Ref Range Status   MRSA by PCR NEGATIVE  NEGATIVE Final   Comment:            The GeneXpert MRSA Assay (FDA     approved for NASAL specimens     only), is one component of a     comprehensive MRSA colonization     surveillance program. It is not     intended to diagnose MRSA     infection nor to guide or     monitor treatment for     MRSA infections.   Medical History: Past Medical History  Diagnosis Date  . Essential hypertension, benign   . Coronary atherosclerosis of native coronary artery     a. CABG/MVR/AVR-2003 (#25 St. Jude/#21 St. Jude); anticoagulation; b. negative stress nuclear-2005;  c. 01/2013 NSTEMI/Cath/PCI: LM nl, LAD 50p, 40-68m, D1 50ost, LCX 39m, RCA 50/62m (2.5x20 Promus DES), 50d, VG->Diag 100, VG->PDA 100, VG->OM 100, LIMA->LAD ok.  .  Epistaxis     Requiring cautery & aterial ligation-09/2009  . GERD (gastroesophageal reflux disease)   . Hyperlipemia   . Abnormal LFTs     Possible cirrhosis  . Chronic anticoagulation   . Valvular heart disease     a. 2003: MVR/AVR-2003 (#25 St. Jude/#21 St. Jude);  b. 01/2013 Echo: EF 55%, Mech AVR mean grad 12, Mech MVR mean grad 6.  . Tobacco abuse     45 pack years  . Fasting hyperglycemia   . Nephrolithiasis   . Diverticulosis   . Hyponatremia   . Chronic diastolic CHF (congestive heart failure)   . Chronic renal disease   . Hemolytic anemia   . ETOH abuse   . Atrial fibrillation 01/16/2013    On coumadin DCCV 07/2013.   Marland Kitchen COPD (chronic obstructive pulmonary disease)    Zosyn 8/5 >>  Assessment: 68yo male in ICU with respiratory failure, intubated.  Asked to initiate Zosyn for suspected aspiration pneumonia.  SCr is rising.    Estimated Creatinine Clearance: 33.4 ml/min (by C-G formula based on Cr of 1.91).  Goal of Therapy:  Eradicate infection.  Plan:  Zosyn 3.375gm IV q8h, each dose over 4 hours (clcr > 20) Monitor labs, renal fxn, and cultures  Hart Robinsons A 02/09/2014,8:22 AM

## 2014-02-10 LAB — CBC WITH DIFFERENTIAL/PLATELET
BASOS PCT: 1 % (ref 0–1)
Basophils Absolute: 0.1 10*3/uL (ref 0.0–0.1)
Eosinophils Absolute: 0.6 10*3/uL (ref 0.0–0.7)
Eosinophils Relative: 12 % — ABNORMAL HIGH (ref 0–5)
HCT: 29.3 % — ABNORMAL LOW (ref 39.0–52.0)
HEMOGLOBIN: 8.8 g/dL — AB (ref 13.0–17.0)
LYMPHS PCT: 5 % — AB (ref 12–46)
Lymphs Abs: 0.3 10*3/uL — ABNORMAL LOW (ref 0.7–4.0)
MCH: 27.7 pg (ref 26.0–34.0)
MCHC: 30 g/dL (ref 30.0–36.0)
MCV: 92.1 fL (ref 78.0–100.0)
MONOS PCT: 6 % (ref 3–12)
Monocytes Absolute: 0.3 10*3/uL (ref 0.1–1.0)
Neutro Abs: 3.7 10*3/uL (ref 1.7–7.7)
Neutrophils Relative %: 76 % (ref 43–77)
Platelets: 215 10*3/uL (ref 150–400)
RBC: 3.18 MIL/uL — ABNORMAL LOW (ref 4.22–5.81)
RDW: 17.4 % — ABNORMAL HIGH (ref 11.5–15.5)
WBC: 5 10*3/uL (ref 4.0–10.5)

## 2014-02-10 LAB — PROTIME-INR
INR: 4.31 — AB (ref 0.00–1.49)
Prothrombin Time: 41.3 seconds — ABNORMAL HIGH (ref 11.6–15.2)

## 2014-02-10 LAB — BASIC METABOLIC PANEL
ANION GAP: 13 (ref 5–15)
BUN: 41 mg/dL — AB (ref 6–23)
CHLORIDE: 95 meq/L — AB (ref 96–112)
CO2: 30 meq/L (ref 19–32)
Calcium: 9.1 mg/dL (ref 8.4–10.5)
Creatinine, Ser: 1.75 mg/dL — ABNORMAL HIGH (ref 0.50–1.35)
GFR calc Af Amer: 44 mL/min — ABNORMAL LOW (ref >90)
GFR calc non Af Amer: 38 mL/min — ABNORMAL LOW (ref >90)
Glucose, Bld: 94 mg/dL (ref 70–99)
POTASSIUM: 4.6 meq/L (ref 3.7–5.3)
Sodium: 138 mEq/L (ref 137–147)

## 2014-02-10 NOTE — Plan of Care (Signed)
Problem: Phase II Progression Outcomes Goal: Obtain order to discontinue catheter if appropriate Outcome: Not Met (add Reason) Continue to monitor I&O closely

## 2014-02-10 NOTE — Progress Notes (Signed)
Wasted 130cc of Fentanyl IV in sink with Penni Homans, RN.  Schonewitz, Eulis Canner 02/10/2014

## 2014-02-10 NOTE — Progress Notes (Signed)
Called report to Jovita Kussmaul, RN on dept 300.  Verbalized understanding. Pt transferred to rm [id number] in safe and stable condition. Schonewitz, Eulis Canner 02/10/2014

## 2014-02-10 NOTE — Progress Notes (Signed)
ANTICOAGULATION CONSULT NOTE - follow up  Pharmacy Consult for Warfarin Indication: MVR/AVR  No Known Allergies  Patient Measurements: Height: 5\' 6"  (167.6 cm) Weight: 148 lb 13 oz (67.5 kg) IBW/kg (Calculated) : 63.8  Vital Signs: Temp: 97.8 F (36.6 C) (08/08 0800) Temp src: Axillary (08/08 0800) BP: 113/60 mmHg (08/08 0900) Pulse Rate: 83 (08/08 0900)  Labs:  Recent Labs  02/08/14 0417 02/09/14 0410 02/10/14 0522  HGB 9.7* 9.3* 8.8*  HCT 32.1* 31.1* 29.3*  PLT 260 250 215  LABPROT 69.4* 58.4* 41.3*  INR 8.37* 6.71* 4.31*  CREATININE 1.84* 1.91* 1.75*   Estimated Creatinine Clearance: 36.5 ml/min (by C-G formula based on Cr of 1.75).  Medical History: Past Medical History  Diagnosis Date  . Essential hypertension, benign   . Coronary atherosclerosis of native coronary artery     a. CABG/MVR/AVR-2003 (#25 St. Jude/#21 St. Jude); anticoagulation; b. negative stress nuclear-2005;  c. 01/2013 NSTEMI/Cath/PCI: LM nl, LAD 50p, 40-70m, D1 50ost, LCX 42m, RCA 50/18m (2.5x20 Promus DES), 50d, VG->Diag 100, VG->PDA 100, VG->OM 100, LIMA->LAD ok.  . Epistaxis     Requiring cautery & aterial ligation-09/2009  . GERD (gastroesophageal reflux disease)   . Hyperlipemia   . Abnormal LFTs     Possible cirrhosis  . Chronic anticoagulation   . Valvular heart disease     a. 2003: MVR/AVR-2003 (#25 St. Jude/#21 St. Jude);  b. 01/2013 Echo: EF 55%, Mech AVR mean grad 12, Mech MVR mean grad 6.  . Tobacco abuse     45 pack years  . Fasting hyperglycemia   . Nephrolithiasis   . Diverticulosis   . Hyponatremia   . Chronic diastolic CHF (congestive heart failure)   . Chronic renal disease   . Hemolytic anemia   . ETOH abuse   . Atrial fibrillation 01/16/2013    On coumadin DCCV 07/2013.   Marland Kitchen COPD (chronic obstructive pulmonary disease)    Medications:  Prescriptions prior to admission  Medication Sig Dispense Refill  . albuterol (PROVENTIL) (2.5 MG/3ML) 0.083% nebulizer solution  Take 3 mLs (2.5 mg total) by nebulization every 2 (two) hours as needed for wheezing.  75 mL  12  . amLODipine (NORVASC) 10 MG tablet Take 10 mg by mouth daily.       Marland Kitchen atorvastatin (LIPITOR) 40 MG tablet Take 40 mg by mouth daily. This Replace Zocor(Simvastatin)      . clopidogrel (PLAVIX) 75 MG tablet Take 1 tablet (75 mg total) by mouth daily with breakfast.  30 tablet  6  . ferrous sulfate 325 (65 FE) MG EC tablet Take 325 mg by mouth daily with breakfast.      . folic acid (FOLVITE) 1 MG tablet Take 1 tablet (1 mg total) by mouth daily.  30 tablet  4  . levalbuterol (XOPENEX) 0.63 MG/3ML nebulizer solution Take 3 mLs (0.63 mg total) by nebulization every 6 (six) hours as needed for wheezing or shortness of breath.  3 mL  4  . magnesium oxide (MAG-OX) 400 (241.3 MG) MG tablet Take 1 tablet (400 mg total) by mouth daily.  30 tablet  6  . metolazone (ZAROXOLYN) 2.5 MG tablet Take 2.5 mg (1 tablet) every week as needed for swelling.  12 tablet  3  . metoprolol (LOPRESSOR) 100 MG tablet Take 1 tablet (100 mg total) by mouth 2 (two) times daily.  60 tablet  6  . Multiple Vitamin (MULTIVITAMIN WITH MINERALS) TABS Take 1 tablet by mouth daily.      Marland Kitchen  pantoprazole (PROTONIX) 40 MG tablet Take 40 mg by mouth daily.      . potassium chloride SA (K-DUR,KLOR-CON) 20 MEQ tablet Take 1 tablet (20 mEq total) by mouth daily.  90 tablet  3  . thiamine 100 MG tablet Take 1 tablet (100 mg total) by mouth daily.  30 tablet  4  . torsemide (DEMADEX) 20 MG tablet Take 3 tablets (60 mg total) by mouth 2 (two) times daily.  180 tablet  6  . warfarin (COUMADIN) 5 MG tablet Take 5-7.5 mg by mouth daily. Takes 7.5mg  on Mondays & 5mg  all other days of week      . acetaminophen (TYLENOL) 500 MG tablet Take 1,000 mg by mouth daily as needed for mild pain or headache.      . loperamide (IMODIUM) 2 MG capsule Take 2 capsules (4 mg total) by mouth every 12 (twelve) hours as needed for diarrhea or loose stools.  30 capsule  0   . nitroGLYCERIN (NITROSTAT) 0.4 MG SL tablet Place 1 tablet (0.4 mg total) under the tongue every 5 (five) minutes x 3 doses as needed for chest pain.  25 tablet  3   Assessment: Recent bleeding epistaxis. On admission INR therapeutic, however increased to 8.37 after two doses following patient normal dosing regiment FFP transfused, no Vitamin K given Coumadin held since 02/06/2014 and INR now 4.31.  Patient transferred from ICU and MD okay for resuming warfarin. Patient also on Plavix in addition to warfarin  Goal of Therapy:  INR 2.5- 3.5    Plan:  No Coumadin today (INR slightly elevated) Daily PT/INR.  Abner Greenspan, Markees Carns Bennett 02/10/2014,10:55 AM

## 2014-02-10 NOTE — Progress Notes (Signed)
Subjective: He says he feels well. He has no new complaints.  Objective: Vital signs in last 24 hours: Temp:  [97.8 F (36.6 C)-98.9 F (37.2 C)] 97.8 F (36.6 C) (08/08 0800) Pulse Rate:  [47-138] 83 (08/08 0900) Resp:  [17-30] 30 (08/08 0900) BP: (99-139)/(39-95) 113/60 mmHg (08/08 0900) SpO2:  [90 %-99 %] 96 % (08/08 0914) FiO2 (%):  [40 %] 40 % (08/07 1851) Weight:  [67.5 kg (148 lb 13 oz)] 67.5 kg (148 lb 13 oz) (08/08 0400) Weight change: -0.6 kg (-1 lb 5.2 oz) Last BM Date: 02/10/14  Intake/Output from previous day: 08/07 0701 - 08/08 0700 In: 1200 [P.O.:200; I.V.:300; IV Piggyback:50] Out: 1000 [Urine:1000]  PHYSICAL EXAM General appearance: alert, cooperative and no distress Resp: clear to auscultation bilaterally Cardio: He is in atrial fibrillation and 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 GI: soft, non-tender; bowel sounds normal; no masses,  no organomegaly Extremities: venous stasis dermatitis noted  Lab Results:  Results for orders placed during the hospital encounter of 02/04/14 (from the past 48 hour(s))  PROTIME-INR     Status: Abnormal   Collection Time    02/09/14  4:10 AM      Result Value Ref Range   Prothrombin Time 58.4 (*) 11.6 - 15.2 seconds   Comment: RESULT REPEATED AND VERIFIED   INR 6.71 (*) 0.00 - 1.49   Comment: RESULT REPEATED AND VERIFIED     CRITICAL RESULT CALLED TO, READ BACK BY AND VERIFIED WITH:     SHERI HAMMOCK RN ON [id number] AT 0625 BY RESSEGGER R  CBC WITH DIFFERENTIAL     Status: Abnormal   Collection Time    02/09/14  4:10 AM      Result Value Ref Range   WBC 6.7  4.0 - 10.5 K/uL   RBC 3.26 (*) 4.22 - 5.81 MIL/uL   Hemoglobin 9.3 (*) 13.0 - 17.0 g/dL   HCT 31.1 (*) 39.0 - 52.0 %   MCV 95.4  78.0 - 100.0 fL   MCH 28.5  26.0 - 34.0 pg   MCHC 29.9 (*) 30.0 - 36.0 g/dL   RDW 17.8 (*) 11.5 - 15.5 %   Platelets 250  150 - 400 K/uL   Neutrophils Relative % 79 (*) 43 - 77 %   Neutro Abs 5.3  1.7 - 7.7 K/uL    Lymphocytes Relative 2 (*) 12 - 46 %   Lymphs Abs 0.2 (*) 0.7 - 4.0 K/uL   Monocytes Relative 6  3 - 12 %   Monocytes Absolute 0.4  0.1 - 1.0 K/uL   Eosinophils Relative 13 (*) 0 - 5 %   Eosinophils Absolute 0.8 (*) 0.0 - 0.7 K/uL   Basophils Relative 0  0 - 1 %   Basophils Absolute 0.0  0.0 - 0.1 K/uL  BASIC METABOLIC PANEL     Status: Abnormal   Collection Time    02/09/14  4:10 AM      Result Value Ref Range   Sodium 143  137 - 147 mEq/L   Potassium 4.5  3.7 - 5.3 mEq/L   Chloride 97  96 - 112 mEq/L   CO2 32  19 - 32 mEq/L   Glucose, Bld 79  70 - 99 mg/dL   BUN 48 (*) 6 - 23 mg/dL   Creatinine, Ser 1.91 (*) 0.50 - 1.35 mg/dL   Calcium 9.2  8.4 - 10.5 mg/dL   GFR calc non Af Amer 34 (*) >90 mL/min  GFR calc Af Amer 40 (*) >90 mL/min   Comment: (NOTE)     The eGFR has been calculated using the CKD EPI equation.     This calculation has not been validated in all clinical situations.     eGFR's persistently <90 mL/min signify possible Chronic Kidney     Disease.   Anion gap 14  5 - 15  PROTIME-INR     Status: Abnormal   Collection Time    02/10/14  5:22 AM      Result Value Ref Range   Prothrombin Time 41.3 (*) 11.6 - 15.2 seconds   INR 4.31 (*) 0.00 - 1.49  CBC WITH DIFFERENTIAL     Status: Abnormal   Collection Time    02/10/14  5:22 AM      Result Value Ref Range   WBC 5.0  4.0 - 10.5 K/uL   Comment: SPECIMEN CHECKED FOR CLOTS     REPEATED TO VERIFY   RBC 3.18 (*) 4.22 - 5.81 MIL/uL   Hemoglobin 8.8 (*) 13.0 - 17.0 g/dL   HCT 29.3 (*) 39.0 - 52.0 %   MCV 92.1  78.0 - 100.0 fL   MCH 27.7  26.0 - 34.0 pg   MCHC 30.0  30.0 - 36.0 g/dL   RDW 17.4 (*) 11.5 - 15.5 %   Platelets 215  150 - 400 K/uL   Neutrophils Relative % 76  43 - 77 %   Lymphocytes Relative 5 (*) 12 - 46 %   Monocytes Relative 6  3 - 12 %   Eosinophils Relative 12 (*) 0 - 5 %   Basophils Relative 1  0 - 1 %   Neutro Abs 3.7  1.7 - 7.7 K/uL   Lymphs Abs 0.3 (*) 0.7 - 4.0 K/uL   Monocytes  Absolute 0.3  0.1 - 1.0 K/uL   Eosinophils Absolute 0.6  0.0 - 0.7 K/uL   Basophils Absolute 0.1  0.0 - 0.1 K/uL  BASIC METABOLIC PANEL     Status: Abnormal   Collection Time    02/10/14  5:22 AM      Result Value Ref Range   Sodium 138  137 - 147 mEq/L   Potassium 4.6  3.7 - 5.3 mEq/L   Chloride 95 (*) 96 - 112 mEq/L   CO2 30  19 - 32 mEq/L   Glucose, Bld 94  70 - 99 mg/dL   BUN 41 (*) 6 - 23 mg/dL   Creatinine, Ser 1.75 (*) 0.50 - 1.35 mg/dL   Calcium 9.1  8.4 - 10.5 mg/dL   GFR calc non Af Amer 38 (*) >90 mL/min   GFR calc Af Amer 44 (*) >90 mL/min   Comment: (NOTE)     The eGFR has been calculated using the CKD EPI equation.     This calculation has not been validated in all clinical situations.     eGFR's persistently <90 mL/min signify possible Chronic Kidney     Disease.   Anion gap 13  5 - 15    ABGS  Recent Labs  02/08/14 0530  PHART 7.512*  PO2ART 128.0*  TCO2 28.0  HCO3 31.5*   CULTURES Recent Results (from the past 240 hour(s))  MRSA PCR SCREENING     Status: None   Collection Time    02/06/14  6:47 PM      Result Value Ref Range Status   MRSA by PCR NEGATIVE  NEGATIVE Final   Comment:  The GeneXpert MRSA Assay (FDA     approved for NASAL specimens     only), is one component of a     comprehensive MRSA colonization     surveillance program. It is not     intended to diagnose MRSA     infection nor to guide or     monitor treatment for     MRSA infections.   Studies/Results: No results found.  Medications:  Prior to Admission:  Prescriptions prior to admission  Medication Sig Dispense Refill  . albuterol (PROVENTIL) (2.5 MG/3ML) 0.083% nebulizer solution Take 3 mLs (2.5 mg total) by nebulization every 2 (two) hours as needed for wheezing.  75 mL  12  . amLODipine (NORVASC) 10 MG tablet Take 10 mg by mouth daily.       Marland Kitchen atorvastatin (LIPITOR) 40 MG tablet Take 40 mg by mouth daily. This Replace Zocor(Simvastatin)      . clopidogrel  (PLAVIX) 75 MG tablet Take 1 tablet (75 mg total) by mouth daily with breakfast.  30 tablet  6  . ferrous sulfate 325 (65 FE) MG EC tablet Take 325 mg by mouth daily with breakfast.      . folic acid (FOLVITE) 1 MG tablet Take 1 tablet (1 mg total) by mouth daily.  30 tablet  4  . levalbuterol (XOPENEX) 0.63 MG/3ML nebulizer solution Take 3 mLs (0.63 mg total) by nebulization every 6 (six) hours as needed for wheezing or shortness of breath.  3 mL  4  . magnesium oxide (MAG-OX) 400 (241.3 MG) MG tablet Take 1 tablet (400 mg total) by mouth daily.  30 tablet  6  . metolazone (ZAROXOLYN) 2.5 MG tablet Take 2.5 mg (1 tablet) every week as needed for swelling.  12 tablet  3  . metoprolol (LOPRESSOR) 100 MG tablet Take 1 tablet (100 mg total) by mouth 2 (two) times daily.  60 tablet  6  . Multiple Vitamin (MULTIVITAMIN WITH MINERALS) TABS Take 1 tablet by mouth daily.      . pantoprazole (PROTONIX) 40 MG tablet Take 40 mg by mouth daily.      . potassium chloride SA (K-DUR,KLOR-CON) 20 MEQ tablet Take 1 tablet (20 mEq total) by mouth daily.  90 tablet  3  . thiamine 100 MG tablet Take 1 tablet (100 mg total) by mouth daily.  30 tablet  4  . torsemide (DEMADEX) 20 MG tablet Take 3 tablets (60 mg total) by mouth 2 (two) times daily.  180 tablet  6  . warfarin (COUMADIN) 5 MG tablet Take 5-7.5 mg by mouth daily. Takes 7.$RemoveBefore'5mg'XLbjwkghwRwpo$  on Mondays & $RemoveB'5mg'XrHFPQbE$  all other days of week      . acetaminophen (TYLENOL) 500 MG tablet Take 1,000 mg by mouth daily as needed for mild pain or headache.      . loperamide (IMODIUM) 2 MG capsule Take 2 capsules (4 mg total) by mouth every 12 (twelve) hours as needed for diarrhea or loose stools.  30 capsule  0  . nitroGLYCERIN (NITROSTAT) 0.4 MG SL tablet Place 1 tablet (0.4 mg total) under the tongue every 5 (five) minutes x 3 doses as needed for chest pain.  25 tablet  3   Scheduled: . sodium chloride   Intravenous Once  . albuterol  2.5 mg Nebulization BID  . atorvastatin  40 mg Oral  q1800  . clopidogrel  75 mg Oral Q breakfast  . metoprolol tartrate  100 mg Oral BID  . nystatin  Topical BID  . pantoprazole (PROTONIX) IV  40 mg Intravenous Q24H  . piperacillin-tazobactam (ZOSYN)  IV  3.375 g Intravenous Q8H  . potassium chloride  20 mEq Oral BID  . potassium chloride  20 mEq Oral BID  . sodium chloride  3 mL Intravenous Q12H  . torsemide  40 mg Oral BID   Continuous: . fentaNYL infusion INTRAVENOUS 25 mcg/hr (02/08/14 0600)   OIP:PGFQMK chloride, acetaminophen, albuterol, fentaNYL, fentaNYL, LORazepam, midazolam, sodium chloride  Assesment: He was admitted to the hospital with nose bleed. He then developed acute on chronic diastolic congestive heart failure and pulmonary edema requiring intubation and mechanical ventilation he is now about 48 hours off the ventilator and doing well. He is chronically anticoagulated which makes problems with his nose bleeds worse. He has coronary artery occlusive disease which is stable. He has chronic atrial fibrillation but his heart rate is well controlled now. He has COPD which contributed to his acute respiratory failure. He has protein calorie malnutrition. He has very "fragile" cardiac status and frequently develops pulmonary edema. Principal Problem:   Epistaxis, recurrent Active Problems:   CHRONIC OBSTRUCTIVE PULMONARY DISEASE   MITRAL VALVE REPLACEMENT, HX OF   AORTIC VALVE REPLACEMENT, HX OF   Chronic anticoagulation   Coronary atherosclerosis of unspecified type of vessel, native or graft   Acute on chronic diastolic CHF (congestive heart failure), NYHA class 4   Alcohol abuse   Atrial fibrillation with rapid ventricular response   Acute-on-chronic respiratory failure   Epistaxis   Protein-calorie malnutrition, severe    Plan: Transfer out of the intensive care unit.    LOS: 6 days   [name] L 02/10/2014, 10:08 AM

## 2014-02-11 LAB — CBC
HCT: 30.7 % — ABNORMAL LOW (ref 39.0–52.0)
Hemoglobin: 9.1 g/dL — ABNORMAL LOW (ref 13.0–17.0)
MCH: 27.5 pg (ref 26.0–34.0)
MCHC: 29.6 g/dL — ABNORMAL LOW (ref 30.0–36.0)
MCV: 92.7 fL (ref 78.0–100.0)
PLATELETS: 198 10*3/uL (ref 150–400)
RBC: 3.31 MIL/uL — AB (ref 4.22–5.81)
RDW: 17.1 % — ABNORMAL HIGH (ref 11.5–15.5)
WBC: 4.1 10*3/uL (ref 4.0–10.5)

## 2014-02-11 LAB — PROTIME-INR
INR: 3.12 — AB (ref 0.00–1.49)
Prothrombin Time: 32.1 seconds — ABNORMAL HIGH (ref 11.6–15.2)

## 2014-02-11 MED ORDER — WARFARIN - PHARMACIST DOSING INPATIENT: Status: DC

## 2014-02-11 MED ORDER — WARFARIN SODIUM 5 MG PO TABS
5.0000 mg | ORAL_TABLET | Freq: Once | ORAL | Status: AC
  Administered 2014-02-11: 5 mg via ORAL
  Filled 2014-02-11: qty 1

## 2014-02-11 MED ORDER — PANTOPRAZOLE SODIUM 40 MG PO TBEC
40.0000 mg | DELAYED_RELEASE_TABLET | Freq: Every day | ORAL | Status: DC
  Administered 2014-02-11 – 2014-02-12 (×2): 40 mg via ORAL
  Filled 2014-02-11 (×2): qty 1

## 2014-02-11 NOTE — Progress Notes (Signed)
Subjective: He feels much better. He has no new complaints. He says he is breathing well. He has not noticed any bleeding. He has no chest pain.  Objective: Vital signs in last 24 hours: Temp:  [98.1 F (36.7 C)-99.3 F (37.4 C)] 98.1 F (36.7 C) (08/09 0351) Pulse Rate:  [83-96] 94 (08/09 0351) Resp:  [18-30] 18 (08/09 0351) BP: (113-122)/(51-64) 122/64 mmHg (08/09 0351) SpO2:  [90 %-96 %] 91 % (08/09 0723) Weight change:  Last BM Date: 02/10/14  Intake/Output from previous day: 08/08 0701 - 08/09 0700 In: 912.5 [P.O.:720; I.V.:92.5; IV Piggyback:100] Out: 1525 [Urine:1525]  PHYSICAL EXAM General appearance: alert, cooperative and no distress Resp: clear to auscultation bilaterally Cardio: Irregularly irregular with prosthetic heart valve sounds GI: soft, non-tender; bowel sounds normal; no masses,  no organomegaly Extremities: extremities normal, atraumatic, no cyanosis or edema  Lab Results:  Results for orders placed during the hospital encounter of 02/04/14 (from the past 48 hour(s))  PROTIME-INR     Status: Abnormal   Collection Time    02/10/14  5:22 AM      Result Value Ref Range   Prothrombin Time 41.3 (*) 11.6 - 15.2 seconds   INR 4.31 (*) 0.00 - 1.49  CBC WITH DIFFERENTIAL     Status: Abnormal   Collection Time    02/10/14  5:22 AM      Result Value Ref Range   WBC 5.0  4.0 - 10.5 K/uL   Comment: SPECIMEN CHECKED FOR CLOTS     REPEATED TO VERIFY   RBC 3.18 (*) 4.22 - 5.81 MIL/uL   Hemoglobin 8.8 (*) 13.0 - 17.0 g/dL   HCT 29.3 (*) 39.0 - 52.0 %   MCV 92.1  78.0 - 100.0 fL   MCH 27.7  26.0 - 34.0 pg   MCHC 30.0  30.0 - 36.0 g/dL   RDW 17.4 (*) 11.5 - 15.5 %   Platelets 215  150 - 400 K/uL   Neutrophils Relative % 76  43 - 77 %   Lymphocytes Relative 5 (*) 12 - 46 %   Monocytes Relative 6  3 - 12 %   Eosinophils Relative 12 (*) 0 - 5 %   Basophils Relative 1  0 - 1 %   Neutro Abs 3.7  1.7 - 7.7 K/uL   Lymphs Abs 0.3 (*) 0.7 - 4.0 K/uL   Monocytes  Absolute 0.3  0.1 - 1.0 K/uL   Eosinophils Absolute 0.6  0.0 - 0.7 K/uL   Basophils Absolute 0.1  0.0 - 0.1 K/uL  BASIC METABOLIC PANEL     Status: Abnormal   Collection Time    02/10/14  5:22 AM      Result Value Ref Range   Sodium 138  137 - 147 mEq/L   Potassium 4.6  3.7 - 5.3 mEq/L   Chloride 95 (*) 96 - 112 mEq/L   CO2 30  19 - 32 mEq/L   Glucose, Bld 94  70 - 99 mg/dL   BUN 41 (*) 6 - 23 mg/dL   Creatinine, Ser 1.75 (*) 0.50 - 1.35 mg/dL   Calcium 9.1  8.4 - 10.5 mg/dL   GFR calc non Af Amer 38 (*) >90 mL/min   GFR calc Af Amer 44 (*) >90 mL/min   Comment: (NOTE)     The eGFR has been calculated using the CKD EPI equation.     This calculation has not been validated in all clinical situations.     eGFR's  persistently <90 mL/min signify possible Chronic Kidney     Disease.   Anion gap 13  5 - 15  PROTIME-INR     Status: Abnormal   Collection Time    02/11/14  6:23 AM      Result Value Ref Range   Prothrombin Time 32.1 (*) 11.6 - 15.2 seconds   INR 3.12 (*) 0.00 - 1.49  CBC     Status: Abnormal   Collection Time    02/11/14  6:23 AM      Result Value Ref Range   WBC 4.1  4.0 - 10.5 K/uL   RBC 3.31 (*) 4.22 - 5.81 MIL/uL   Hemoglobin 9.1 (*) 13.0 - 17.0 g/dL   HCT 30.7 (*) 39.0 - 52.0 %   MCV 92.7  78.0 - 100.0 fL   MCH 27.5  26.0 - 34.0 pg   MCHC 29.6 (*) 30.0 - 36.0 g/dL   RDW 17.1 (*) 11.5 - 15.5 %   Platelets 198  150 - 400 K/uL    ABGS No results found for this basename: PHART, PCO2, PO2ART, TCO2, HCO3,  in the last 72 hours CULTURES Recent Results (from the past 240 hour(s))  MRSA PCR SCREENING     Status: None   Collection Time    02/06/14  6:47 PM      Result Value Ref Range Status   MRSA by PCR NEGATIVE  NEGATIVE Final   Comment:            The GeneXpert MRSA Assay (FDA     approved for NASAL specimens     only), is one component of a     comprehensive MRSA colonization     surveillance program. It is not     intended to diagnose MRSA      infection nor to guide or     monitor treatment for     MRSA infections.   Studies/Results: No results found.  Medications:  Prior to Admission:  Prescriptions prior to admission  Medication Sig Dispense Refill  . albuterol (PROVENTIL) (2.5 MG/3ML) 0.083% nebulizer solution Take 3 mLs (2.5 mg total) by nebulization every 2 (two) hours as needed for wheezing.  75 mL  12  . amLODipine (NORVASC) 10 MG tablet Take 10 mg by mouth daily.       Marland Kitchen atorvastatin (LIPITOR) 40 MG tablet Take 40 mg by mouth daily. This Replace Zocor(Simvastatin)      . clopidogrel (PLAVIX) 75 MG tablet Take 1 tablet (75 mg total) by mouth daily with breakfast.  30 tablet  6  . ferrous sulfate 325 (65 FE) MG EC tablet Take 325 mg by mouth daily with breakfast.      . folic acid (FOLVITE) 1 MG tablet Take 1 tablet (1 mg total) by mouth daily.  30 tablet  4  . levalbuterol (XOPENEX) 0.63 MG/3ML nebulizer solution Take 3 mLs (0.63 mg total) by nebulization every 6 (six) hours as needed for wheezing or shortness of breath.  3 mL  4  . magnesium oxide (MAG-OX) 400 (241.3 MG) MG tablet Take 1 tablet (400 mg total) by mouth daily.  30 tablet  6  . metolazone (ZAROXOLYN) 2.5 MG tablet Take 2.5 mg (1 tablet) every week as needed for swelling.  12 tablet  3  . metoprolol (LOPRESSOR) 100 MG tablet Take 1 tablet (100 mg total) by mouth 2 (two) times daily.  60 tablet  6  . Multiple Vitamin (MULTIVITAMIN WITH MINERALS) TABS Take 1  tablet by mouth daily.      . pantoprazole (PROTONIX) 40 MG tablet Take 40 mg by mouth daily.      . potassium chloride SA (K-DUR,KLOR-CON) 20 MEQ tablet Take 1 tablet (20 mEq total) by mouth daily.  90 tablet  3  . thiamine 100 MG tablet Take 1 tablet (100 mg total) by mouth daily.  30 tablet  4  . torsemide (DEMADEX) 20 MG tablet Take 3 tablets (60 mg total) by mouth 2 (two) times daily.  180 tablet  6  . warfarin (COUMADIN) 5 MG tablet Take 5-7.5 mg by mouth daily. Takes 7.$RemoveBefore'5mg'[id number]$  on Mondays & $RemoveB'5mg'MfarvWod$  all other  days of week      . acetaminophen (TYLENOL) 500 MG tablet Take 1,000 mg by mouth daily as needed for mild pain or headache.      . loperamide (IMODIUM) 2 MG capsule Take 2 capsules (4 mg total) by mouth every 12 (twelve) hours as needed for diarrhea or loose stools.  30 capsule  0  . nitroGLYCERIN (NITROSTAT) 0.4 MG SL tablet Place 1 tablet (0.4 mg total) under the tongue every 5 (five) minutes x 3 doses as needed for chest pain.  25 tablet  3   Scheduled: . albuterol  2.5 mg Nebulization BID  . atorvastatin  40 mg Oral q1800  . clopidogrel  75 mg Oral Q breakfast  . metoprolol tartrate  100 mg Oral BID  . nystatin   Topical BID  . pantoprazole (PROTONIX) IV  40 mg Intravenous Q24H  . piperacillin-tazobactam (ZOSYN)  IV  3.375 g Intravenous Q8H  . potassium chloride  20 mEq Oral BID  . potassium chloride  20 mEq Oral BID  . sodium chloride  3 mL Intravenous Q12H  . torsemide  40 mg Oral BID   Continuous:  QHU:TMLYYT chloride, acetaminophen, albuterol, LORazepam, midazolam, sodium chloride  Assesment: He was admitted with recurrent epistaxis and then developed acute pulmonary edema. He developed respiratory failure from that and required intubation and mechanical ventilation. He is improved. He has multiple other medical problems as noted. He is generally much more stable. Principal Problem:   Epistaxis, recurrent Active Problems:   CHRONIC OBSTRUCTIVE PULMONARY DISEASE   MITRAL VALVE REPLACEMENT, HX OF   AORTIC VALVE REPLACEMENT, HX OF   Chronic anticoagulation   Coronary atherosclerosis of unspecified type of vessel, native or graft   Acute on chronic diastolic CHF (congestive heart failure), NYHA class 4   Alcohol abuse   Atrial fibrillation with rapid ventricular response   Acute-on-chronic respiratory failure   Epistaxis   Protein-calorie malnutrition, severe    Plan: Switch him to all oral treatments and potential discharge tomorrow    LOS: 7 days   Laurena Valko  L 02/11/2014, 8:01 AM

## 2014-02-11 NOTE — Progress Notes (Signed)
ANTICOAGULATION CONSULT NOTE - follow up  Pharmacy Consult for Warfarin Indication: MVR/AVR  No Known Allergies  Patient Measurements: Height: 5\' 6"  (167.6 cm) Weight: 148 lb 13 oz (67.5 kg) IBW/kg (Calculated) : 63.8  Vital Signs: Temp: 98.1 F (36.7 C) (08/09 0351) Temp src: Oral (08/09 0351) BP: 122/64 mmHg (08/09 0351) Pulse Rate: 94 (08/09 0351)  Labs:  Recent Labs  02/09/14 0410 02/10/14 0522 02/11/14 0623  HGB 9.3* 8.8* 9.1*  HCT 31.1* 29.3* 30.7*  PLT 250 215 198  LABPROT 58.4* 41.3* 32.1*  INR 6.71* 4.31* 3.12*  CREATININE 1.91* 1.75*  --    Estimated Creatinine Clearance: 36.5 ml/min (by C-G formula based on Cr of 1.75).  Medical History: Past Medical History  Diagnosis Date  . Essential hypertension, benign   . Coronary atherosclerosis of native coronary artery     a. CABG/MVR/AVR-2003 (#25 St. Jude/#21 St. Jude); anticoagulation; b. negative stress nuclear-2005;  c. 01/2013 NSTEMI/Cath/PCI: LM nl, LAD 50p, 40-38m, D1 50ost, LCX 96m, RCA 50/46m (2.5x20 Promus DES), 50d, VG->Diag 100, VG->PDA 100, VG->OM 100, LIMA->LAD ok.  . Epistaxis     Requiring cautery & aterial ligation-09/2009  . GERD (gastroesophageal reflux disease)   . Hyperlipemia   . Abnormal LFTs     Possible cirrhosis  . Chronic anticoagulation   . Valvular heart disease     a. 2003: MVR/AVR-2003 (#25 St. Jude/#21 St. Jude);  b. 01/2013 Echo: EF 55%, Mech AVR mean grad 12, Mech MVR mean grad 6.  . Tobacco abuse     45 pack years  . Fasting hyperglycemia   . Nephrolithiasis   . Diverticulosis   . Hyponatremia   . Chronic diastolic CHF (congestive heart failure)   . Chronic renal disease   . Hemolytic anemia   . ETOH abuse   . Atrial fibrillation 01/16/2013    On coumadin DCCV 07/2013.   Marland Kitchen COPD (chronic obstructive pulmonary disease)    Medications:  Prescriptions prior to admission  Medication Sig Dispense Refill  . albuterol (PROVENTIL) (2.5 MG/3ML) 0.083% nebulizer solution Take  3 mLs (2.5 mg total) by nebulization every 2 (two) hours as needed for wheezing.  75 mL  12  . amLODipine (NORVASC) 10 MG tablet Take 10 mg by mouth daily.       Marland Kitchen atorvastatin (LIPITOR) 40 MG tablet Take 40 mg by mouth daily. This Replace Zocor(Simvastatin)      . clopidogrel (PLAVIX) 75 MG tablet Take 1 tablet (75 mg total) by mouth daily with breakfast.  30 tablet  6  . ferrous sulfate 325 (65 FE) MG EC tablet Take 325 mg by mouth daily with breakfast.      . folic acid (FOLVITE) 1 MG tablet Take 1 tablet (1 mg total) by mouth daily.  30 tablet  4  . levalbuterol (XOPENEX) 0.63 MG/3ML nebulizer solution Take 3 mLs (0.63 mg total) by nebulization every 6 (six) hours as needed for wheezing or shortness of breath.  3 mL  4  . magnesium oxide (MAG-OX) 400 (241.3 MG) MG tablet Take 1 tablet (400 mg total) by mouth daily.  30 tablet  6  . metolazone (ZAROXOLYN) 2.5 MG tablet Take 2.5 mg (1 tablet) every week as needed for swelling.  12 tablet  3  . metoprolol (LOPRESSOR) 100 MG tablet Take 1 tablet (100 mg total) by mouth 2 (two) times daily.  60 tablet  6  . Multiple Vitamin (MULTIVITAMIN WITH MINERALS) TABS Take 1 tablet by mouth daily.      Marland Kitchen  pantoprazole (PROTONIX) 40 MG tablet Take 40 mg by mouth daily.      . potassium chloride SA (K-DUR,KLOR-CON) 20 MEQ tablet Take 1 tablet (20 mEq total) by mouth daily.  90 tablet  3  . thiamine 100 MG tablet Take 1 tablet (100 mg total) by mouth daily.  30 tablet  4  . torsemide (DEMADEX) 20 MG tablet Take 3 tablets (60 mg total) by mouth 2 (two) times daily.  180 tablet  6  . warfarin (COUMADIN) 5 MG tablet Take 5-7.5 mg by mouth daily. Takes 7.5mg  on Mondays & 5mg  all other days of week      . acetaminophen (TYLENOL) 500 MG tablet Take 1,000 mg by mouth daily as needed for mild pain or headache.      . loperamide (IMODIUM) 2 MG capsule Take 2 capsules (4 mg total) by mouth every 12 (twelve) hours as needed for diarrhea or loose stools.  30 capsule  0  .  nitroGLYCERIN (NITROSTAT) 0.4 MG SL tablet Place 1 tablet (0.4 mg total) under the tongue every 5 (five) minutes x 3 doses as needed for chest pain.  25 tablet  3   Assessment: Recent bleeding epistaxis. On admission INR therapeutic, however increased to 8.37 after two doses following patient normal dosing regiment FFP transfused, no Vitamin K given Coumadin held since 02/06/2014 .  Patient transferred from ICU and MD okay for resuming warfarin. Patient also on Plavix in addition to warfarin INR therapeutic this AM  Goal of Therapy:  INR 2.5- 3.5    Plan:  Coumadin 5 mg po today x 1 dose Daily PT/INR.  Abner Greenspan, Rifky Lapre Bennett 02/11/2014,8:30 AM

## 2014-02-12 LAB — PROTIME-INR
INR: 1.95 — ABNORMAL HIGH (ref 0.00–1.49)
Prothrombin Time: 22.2 seconds — ABNORMAL HIGH (ref 11.6–15.2)

## 2014-02-12 NOTE — Progress Notes (Signed)
AVS reviewed with patient.  Verbalized understanding of discharge instructions, physician follow-up and medications.  2 IVs removed.  Site WNL.  Patient transported by NT via w/c to main entrance for discharge.  Patient stable at time of discharge.

## 2014-02-12 NOTE — Discharge Summary (Signed)
Physician Discharge Summary  Patient ID: [name] MRN: [id number] DOB/AGE: [birth year] 68 y.o. Primary Care Physician:Donley Harland L, MD Admit date: 02/04/2014 Discharge date: 02/12/2014    Discharge Diagnoses:   Principal Problem:   Acute-on-chronic respiratory failure Active Problems:   CHRONIC OBSTRUCTIVE PULMONARY DISEASE   MITRAL VALVE REPLACEMENT, HX OF   AORTIC VALVE REPLACEMENT, HX OF   Chronic anticoagulation   Coronary atherosclerosis of unspecified type of vessel, native or graft   Acute on chronic diastolic CHF (congestive heart failure), NYHA class 4   Alcohol abuse   Atrial fibrillation with rapid ventricular response   Epistaxis, recurrent   Epistaxis   Protein-calorie malnutrition, severe     Medication List    STOP taking these medications       albuterol (2.5 MG/3ML) 0.083% nebulizer solution  Commonly known as:  PROVENTIL      TAKE these medications       acetaminophen 500 MG tablet  Commonly known as:  TYLENOL  Take 1,000 mg by mouth daily as needed for mild pain or headache.     amLODipine 10 MG tablet  Commonly known as:  NORVASC  Take 10 mg by mouth daily.     atorvastatin 40 MG tablet  Commonly known as:  LIPITOR  Take 40 mg by mouth daily. This Replace Zocor(Simvastatin)     clopidogrel 75 MG tablet  Commonly known as:  PLAVIX  Take 1 tablet (75 mg total) by mouth daily with breakfast.     ferrous sulfate 325 (65 FE) MG EC tablet  Take 325 mg by mouth daily with breakfast.     folic acid 1 MG tablet  Commonly known as:  FOLVITE  Take 1 tablet (1 mg total) by mouth daily.     levalbuterol 0.63 MG/3ML nebulizer solution  Commonly known as:  XOPENEX  Take 3 mLs (0.63 mg total) by nebulization every 6 (six) hours as needed for wheezing or shortness of breath.     loperamide 2 MG capsule  Commonly known as:  IMODIUM  Take 2 capsules (4 mg total) by mouth every 12 (twelve) hours as needed for diarrhea or loose stools.     magnesium oxide 400 (241.3 MG) MG tablet  Commonly known as:  MAG-OX  Take 1 tablet (400 mg total) by mouth daily.     metolazone 2.5 MG tablet  Commonly known as:  ZAROXOLYN  Take 2.5 mg (1 tablet) every week as needed for swelling.     metoprolol 100 MG tablet  Commonly known as:  LOPRESSOR  Take 1 tablet (100 mg total) by mouth 2 (two) times daily.     multivitamin with minerals Tabs tablet  Take 1 tablet by mouth daily.     nitroGLYCERIN 0.4 MG SL tablet  Commonly known as:  NITROSTAT  Place 1 tablet (0.4 mg total) under the tongue every 5 (five) minutes x 3 doses as needed for chest pain.     pantoprazole 40 MG tablet  Commonly known as:  PROTONIX  Take 40 mg by mouth daily.     potassium chloride SA 20 MEQ tablet  Commonly known as:  K-DUR,KLOR-CON  Take 1 tablet (20 mEq total) by mouth daily.     thiamine 100 MG tablet  Take 1 tablet (100 mg total) by mouth daily.     torsemide 20 MG tablet  Commonly known as:  DEMADEX  Take 3 tablets (60 mg total) by mouth 2 (two) times daily.  warfarin 5 MG tablet  Commonly known as:  COUMADIN  Take 5-7.5 mg by mouth daily. Takes 7.5mg  on Mondays & 5mg  all other days of week        Discharged Condition: Improved    Consults: Cardiology  Significant Diagnostic Studies: Dg Abd 1 View  02/06/2014   CLINICAL DATA:  An OG tube placement  EXAM: ABDOMEN - 1 VIEW  COMPARISON:  None.  FINDINGS: Enteric tube tip and side port projects over the expected location of the gastric fundus.  Paucity of bowel gas without definite evidence of obstruction.  Vascular calcifications overlie the lower pelvis and upper thigh bilaterally.  No acute osseus abnormalities.  IMPRESSION: Enteric tube tip and side port projects over the gastric fundus.   Electronically Signed   By: Sandi Mariscal M.D.   On: 02/06/2014 20:01   Dg Chest Port 1 View  02/08/2014   CLINICAL DATA:  Respiratory failure  EXAM: PORTABLE CHEST - 1 VIEW  COMPARISON:  02/07/2014.   FINDINGS: Endotracheal tube ends between the clavicles and carina. Orogastric tube reaches the stomach.  Unchanged moderate cardiomegaly status post median sternotomy for aortic valve replacement and probable CABG.  Mild improvement in pulmonary edema. Layering small pleural effusions. The chin overlaps the left apex. No evidence of pneumothorax.  IMPRESSION: 1. Stable positioning of endotracheal and orogastric tubes. 2. Improved pulmonary edema.  Small bilateral pleural effusions.   Electronically Signed   By: Jorje Guild M.D.   On: 02/08/2014 06:59   Portable Chest Xray In Am  02/07/2014   CLINICAL DATA:  Hypoxia  EXAM: PORTABLE CHEST - 1 VIEW  COMPARISON:  February 06, 2014  FINDINGS: Endotracheal tube tip is 4.3 cm above the carina. Nasogastric tube tip and side port are below the diaphragm. No pneumothorax. There is diffuse interstitial edema. There is patchy alveolar edema in the bases. There is a moderate right effusion. There is patchy consolidation in both bases. There is cardiomegaly with pulmonary venous hypertension. Patient is status post mitral and aortic valve replacements.  IMPRESSION: Persistent congestive heart failure. Consolidation in both lung bases may be in part due to atelectasis, although superimposed pneumonia in the bases cannot be excluded. No pneumothorax. No adenopathy. Tube positions as described.   Electronically Signed   By: Lowella Grip M.D.   On: 02/07/2014 07:03   Portable Chest Xray  02/06/2014   CLINICAL DATA:  Endotracheal tube placement  EXAM: PORTABLE CHEST - 1 VIEW  COMPARISON:  02/06/2014  FINDINGS: Endotracheal tube in good position. Congestive heart failure with bilateral edema shows mild progression. Right pleural effusion unchanged. No significant left effusion.  IMPRESSION: Satisfactory endotracheal tube position. Mild progression of pulmonary edema.   Electronically Signed   By: Franchot Gallo M.D.   On: 02/06/2014 19:40   Dg Chest Port 1 View  02/06/2014    CLINICAL DATA:  Short of breath and decreasing oxygen saturations.  EXAM: PORTABLE CHEST - 1 VIEW  COMPARISON:  12/04/2013  FINDINGS: There are changes from cardiac surgery and valve replacement, stable. Cardiac silhouette is mildly enlarged. There is vascular congestion, irregular interstitial thickening and hazy mid and lower lung zone airspace opacity, greater on the right. Hemidiaphragms are partly obscured consistent small bilateral effusions. No pneumothorax.  IMPRESSION: Congestive heart failure   Electronically Signed   By: Lajean Manes M.D.   On: 02/06/2014 11:09    Lab Results: Basic Metabolic Panel:  Recent Labs  02/10/14 0522  NA 138  K 4.6  CL 95*  CO2 30  GLUCOSE 94  BUN 41*  CREATININE 1.75*  CALCIUM 9.1   Liver Function Tests: No results found for this basename: AST, ALT, ALKPHOS, BILITOT, PROT, ALBUMIN,  in the last 72 hours   CBC:  Recent Labs  02/10/14 0522 02/11/14 0623  WBC 5.0 4.1  NEUTROABS 3.7  --   HGB 8.8* 9.1*  HCT 29.3* 30.7*  MCV 92.1 92.7  PLT 215 198    Recent Results (from the past 240 hour(s))  MRSA PCR SCREENING     Status: None   Collection Time    02/06/14  6:47 PM      Result Value Ref Range Status   MRSA by PCR NEGATIVE  NEGATIVE Final   Comment:            The GeneXpert MRSA Assay (FDA     approved for NASAL specimens     only), is one component of a     comprehensive MRSA colonization     surveillance program. It is not     intended to diagnose MRSA     infection nor to guide or     monitor treatment for     MRSA infections.     Hospital Course: This is a 68 year old who came to the emergency department because he had nosebleed that he could not get stopped. He has had previous episodes of nose bleed in the past. He was treated in the emergency department but it was felt that he needed observation to make sure that he did not start rebleeding. He had a device placed in both nostrils to reduce his bleeding. He did have  trouble because he is on chronic nasal oxygen and had to be placed on mask oxygen. He was started with saline lock and did not receive any IV fluids but developed acute pulmonary edema which has happened in the past. He was transferred to the intensive care unit where he required intubation and mechanical ventilation. He was on mechanical ventilation for about 60 hours and then was able to be extubated. He continued to improve. He was transferred out of the intensive care unit and eventually was ready for discharge home in improved condition  Discharge Exam: Blood pressure 151/88, pulse 86, temperature 98.7 F (37.1 C), temperature source Oral, resp. rate 20, height 5\' 6"  (1.676 m), weight 67.5 kg (148 lb 13 oz), SpO2 95.00%. He is awake and alert. His chest is clear. He has prosthetic aortic and mitral valve sounds. He is no edema.  Disposition: Home with home health services      Discharge Instructions   Discharge patient    Complete by:  As directed      Face-to-face encounter (required for Medicare/Medicaid patients)    Complete by:  As directed   I Corabelle Spackman L certify that this patient is under my care and that I, or a nurse practitioner or physician's assistant working with me, had a face-to-face encounter that meets the physician face-to-face encounter requirements with this patient on 02/12/2014. The encounter with the patient was in whole, or in part for the following medical condition(s) which is the primary reason for home health care (List medical condition): CHF/respiratory failure/epistaxis  The encounter with the patient was in whole, or in part, for the following medical condition, which is the primary reason for home health care:  chf/respiratory failure/epistaxis  I certify that, based on my findings, the following services are medically necessary home health services:  Nursing Physical therapy    My clinical findings support the need for the above services:  Shortness of  breath with activity  Further, I certify that my clinical findings support that this patient is homebound due to:  Shortness of Breath with activity  Reason for Medically Necessary Home Health Services:  Skilled Nursing- Change/Decline in Patient Status     Home Health    Complete by:  As directed   Please do BMET and PT/INR on 8./05/2014. Call INR to [facility] [place] [facility]  To provide the following care/treatments:   PT RN             Follow-up Information   Follow up with Arnoldo Lenis, MD On 02/12/2014. (10:20 am)    Specialty:  Cardiology   Contact information:   [address] [place] [address] [phone]       Signed: Alonza Bogus   02/12/2014, 8:45 AM

## 2014-02-12 NOTE — Progress Notes (Signed)
Subjective: He says he feels well and wants to go home. He has no new complaints. His breathing is doing well. He is not having any more bleeding.  Objective: Vital signs in last 24 hours: Temp:  [98.3 F (36.8 C)-98.7 F (37.1 C)] 98.7 F (37.1 C) (08/10 0444) Pulse Rate:  [74-97] 86 (08/10 0444) Resp:  [18-20] 20 (08/10 0444) BP: (119-151)/(70-88) 151/88 mmHg (08/10 0444) SpO2:  [90 %-95 %] 95 % (08/10 0704) Weight change:  Last BM Date: 02/10/14  Intake/Output from previous day: 08/09 0701 - 08/10 0700 In: 290 [P.O.:240; I.V.:50] Out: 1577 [Urine:1575; Stool:2]  PHYSICAL EXAM General appearance: alert, cooperative and no distress Resp: clear to auscultation bilaterally Cardio: He has prosthetic aortic and mitral valve sounds GI: soft, non-tender; bowel sounds normal; no masses,  no organomegaly Extremities: extremities normal, atraumatic, no cyanosis or edema  Lab Results:  Results for orders placed during the hospital encounter of 02/04/14 (from the past 48 hour(s))  PROTIME-INR     Status: Abnormal   Collection Time    02/11/14  6:23 AM      Result Value Ref Range   Prothrombin Time 32.1 (*) 11.6 - 15.2 seconds   INR 3.12 (*) 0.00 - 1.49  CBC     Status: Abnormal   Collection Time    02/11/14  6:23 AM      Result Value Ref Range   WBC 4.1  4.0 - 10.5 K/uL   RBC 3.31 (*) 4.22 - 5.81 MIL/uL   Hemoglobin 9.1 (*) 13.0 - 17.0 g/dL   HCT 30.7 (*) 39.0 - 52.0 %   MCV 92.7  78.0 - 100.0 fL   MCH 27.5  26.0 - 34.0 pg   MCHC 29.6 (*) 30.0 - 36.0 g/dL   RDW 17.1 (*) 11.5 - 15.5 %   Platelets 198  150 - 400 K/uL  PROTIME-INR     Status: Abnormal   Collection Time    02/12/14  5:45 AM      Result Value Ref Range   Prothrombin Time 22.2 (*) 11.6 - 15.2 seconds   INR 1.95 (*) 0.00 - 1.49    ABGS No results found for this basename: PHART, PCO2, PO2ART, TCO2, HCO3,  in the last 72 hours CULTURES Recent Results (from the past 240 hour(s))  MRSA PCR SCREENING      Status: None   Collection Time    02/06/14  6:47 PM      Result Value Ref Range Status   MRSA by PCR NEGATIVE  NEGATIVE Final   Comment:            The GeneXpert MRSA Assay (FDA     approved for NASAL specimens     only), is one component of a     comprehensive MRSA colonization     surveillance program. It is not     intended to diagnose MRSA     infection nor to guide or     monitor treatment for     MRSA infections.   Studies/Results: No results found.  Medications:  Prior to Admission:  Prescriptions prior to admission  Medication Sig Dispense Refill  . albuterol (PROVENTIL) (2.5 MG/3ML) 0.083% nebulizer solution Take 3 mLs (2.5 mg total) by nebulization every 2 (two) hours as needed for wheezing.  75 mL  12  . amLODipine (NORVASC) 10 MG tablet Take 10 mg by mouth daily.       Marland Kitchen atorvastatin (LIPITOR) 40 MG tablet Take 40 mg  by mouth daily. This Replace Zocor(Simvastatin)      . clopidogrel (PLAVIX) 75 MG tablet Take 1 tablet (75 mg total) by mouth daily with breakfast.  30 tablet  6  . ferrous sulfate 325 (65 FE) MG EC tablet Take 325 mg by mouth daily with breakfast.      . folic acid (FOLVITE) 1 MG tablet Take 1 tablet (1 mg total) by mouth daily.  30 tablet  4  . levalbuterol (XOPENEX) 0.63 MG/3ML nebulizer solution Take 3 mLs (0.63 mg total) by nebulization every 6 (six) hours as needed for wheezing or shortness of breath.  3 mL  4  . magnesium oxide (MAG-OX) 400 (241.3 MG) MG tablet Take 1 tablet (400 mg total) by mouth daily.  30 tablet  6  . metolazone (ZAROXOLYN) 2.5 MG tablet Take 2.5 mg (1 tablet) every week as needed for swelling.  12 tablet  3  . metoprolol (LOPRESSOR) 100 MG tablet Take 1 tablet (100 mg total) by mouth 2 (two) times daily.  60 tablet  6  . Multiple Vitamin (MULTIVITAMIN WITH MINERALS) TABS Take 1 tablet by mouth daily.      . pantoprazole (PROTONIX) 40 MG tablet Take 40 mg by mouth daily.      . potassium chloride SA (K-DUR,KLOR-CON) 20 MEQ tablet  Take 1 tablet (20 mEq total) by mouth daily.  90 tablet  3  . thiamine 100 MG tablet Take 1 tablet (100 mg total) by mouth daily.  30 tablet  4  . torsemide (DEMADEX) 20 MG tablet Take 3 tablets (60 mg total) by mouth 2 (two) times daily.  180 tablet  6  . warfarin (COUMADIN) 5 MG tablet Take 5-7.5 mg by mouth daily. Takes 7.5mg  on Mondays & 5mg  all other days of week      . acetaminophen (TYLENOL) 500 MG tablet Take 1,000 mg by mouth daily as needed for mild pain or headache.      . loperamide (IMODIUM) 2 MG capsule Take 2 capsules (4 mg total) by mouth every 12 (twelve) hours as needed for diarrhea or loose stools.  30 capsule  0  . nitroGLYCERIN (NITROSTAT) 0.4 MG SL tablet Place 1 tablet (0.4 mg total) under the tongue every 5 (five) minutes x 3 doses as needed for chest pain.  25 tablet  3   Scheduled: . albuterol  2.5 mg Nebulization BID  . atorvastatin  40 mg Oral q1800  . clopidogrel  75 mg Oral Q breakfast  . metoprolol tartrate  100 mg Oral BID  . nystatin   Topical BID  . pantoprazole  40 mg Oral Q1200  . potassium chloride  20 mEq Oral BID  . sodium chloride  3 mL Intravenous Q12H  . torsemide  40 mg Oral BID  . Warfarin - Pharmacist Dosing Inpatient   Does not apply Q24H   Continuous:  WNI:OEVOJJ chloride, acetaminophen, albuterol, sodium chloride  Assesment: He was admitted with epistaxis. This is been a recurrent problem. Part of this is because he is chronically anticoagulated for aortic and mitral valve replacement. He developed acute on chronic diastolic heart failure in the hospital. He required intubation and mechanical ventilation. He has since improved. He is back to baseline and ready for discharge Principal Problem:   Epistaxis, recurrent Active Problems:   CHRONIC OBSTRUCTIVE PULMONARY DISEASE   MITRAL VALVE REPLACEMENT, HX OF   AORTIC VALVE REPLACEMENT, HX OF   Chronic anticoagulation   Coronary atherosclerosis of unspecified type of vessel,  native or graft    Acute on chronic diastolic CHF (congestive heart failure), NYHA class 4   Alcohol abuse   Atrial fibrillation with rapid ventricular response   Acute-on-chronic respiratory failure   Epistaxis   Protein-calorie malnutrition, severe    Plan: Discharge home today    LOS: 8 days   Mistie Adney L 02/12/2014, 8:26 AM

## 2014-02-13 ENCOUNTER — Ambulatory Visit (INDEPENDENT_AMBULATORY_CARE_PROVIDER_SITE_OTHER): Payer: Medicare Other | Admitting: *Deleted

## 2014-02-13 DIAGNOSIS — Z9889 Other specified postprocedural states: Secondary | ICD-10-CM

## 2014-02-13 DIAGNOSIS — Z5181 Encounter for therapeutic drug level monitoring: Secondary | ICD-10-CM

## 2014-02-13 DIAGNOSIS — Z954 Presence of other heart-valve replacement: Secondary | ICD-10-CM

## 2014-02-13 LAB — POCT INR: INR: 2.2

## 2014-02-15 ENCOUNTER — Ambulatory Visit (INDEPENDENT_AMBULATORY_CARE_PROVIDER_SITE_OTHER): Payer: Medicare Other | Admitting: *Deleted

## 2014-02-15 ENCOUNTER — Telehealth: Payer: Self-pay | Admitting: *Deleted

## 2014-02-15 DIAGNOSIS — Z5181 Encounter for therapeutic drug level monitoring: Secondary | ICD-10-CM

## 2014-02-15 DIAGNOSIS — Z9889 Other specified postprocedural states: Secondary | ICD-10-CM

## 2014-02-15 DIAGNOSIS — Z954 Presence of other heart-valve replacement: Secondary | ICD-10-CM

## 2014-02-15 LAB — POCT INR: INR: 2.5

## 2014-02-15 NOTE — Telephone Encounter (Signed)
INR 2.5 NO MISSED DOSES

## 2014-02-15 NOTE — Telephone Encounter (Signed)
See coumadin note. 

## 2014-02-19 ENCOUNTER — Ambulatory Visit (INDEPENDENT_AMBULATORY_CARE_PROVIDER_SITE_OTHER): Payer: Medicare Other | Admitting: *Deleted

## 2014-02-19 DIAGNOSIS — Z5181 Encounter for therapeutic drug level monitoring: Secondary | ICD-10-CM

## 2014-02-19 DIAGNOSIS — Z954 Presence of other heart-valve replacement: Secondary | ICD-10-CM

## 2014-02-19 DIAGNOSIS — Z9889 Other specified postprocedural states: Secondary | ICD-10-CM

## 2014-02-19 LAB — POCT INR: INR: 3.7

## 2014-02-20 LAB — BLOOD GAS, ARTERIAL
Acid-Base Excess: 16.1 mmol/L — ABNORMAL HIGH (ref 0.0–2.0)
Bicarbonate: 40.9 mEq/L — ABNORMAL HIGH (ref 20.0–24.0)
Drawn by: 22223
FIO2: 45 %
MECHVT: 550 mL
O2 Saturation: 94.4 %
PCO2 ART: 54.5 mmHg — AB (ref 35.0–45.0)
PEEP: 5 cmH2O
Patient temperature: 37
RATE: 12 resp/min
TCO2: 37.6 mmol/L (ref 0–100)
pH, Arterial: 7.487 — ABNORMAL HIGH (ref 7.350–7.450)
pO2, Arterial: 73.3 mmHg — ABNORMAL LOW (ref 80.0–100.0)

## 2014-02-22 ENCOUNTER — Ambulatory Visit (INDEPENDENT_AMBULATORY_CARE_PROVIDER_SITE_OTHER): Payer: Medicare Other | Admitting: *Deleted

## 2014-02-22 DIAGNOSIS — Z5181 Encounter for therapeutic drug level monitoring: Secondary | ICD-10-CM

## 2014-02-22 DIAGNOSIS — Z9889 Other specified postprocedural states: Secondary | ICD-10-CM

## 2014-02-22 DIAGNOSIS — Z954 Presence of other heart-valve replacement: Secondary | ICD-10-CM

## 2014-02-22 LAB — POCT INR: INR: 3.2

## 2014-02-28 ENCOUNTER — Ambulatory Visit (INDEPENDENT_AMBULATORY_CARE_PROVIDER_SITE_OTHER): Payer: Medicare Other | Admitting: *Deleted

## 2014-02-28 ENCOUNTER — Telehealth: Payer: Self-pay | Admitting: *Deleted

## 2014-02-28 DIAGNOSIS — Z9889 Other specified postprocedural states: Secondary | ICD-10-CM

## 2014-02-28 DIAGNOSIS — Z954 Presence of other heart-valve replacement: Secondary | ICD-10-CM

## 2014-02-28 DIAGNOSIS — Z5181 Encounter for therapeutic drug level monitoring: Secondary | ICD-10-CM

## 2014-02-28 LAB — POCT INR: INR: 3.2

## 2014-02-28 NOTE — Telephone Encounter (Signed)
See coumadin note. 

## 2014-02-28 NOTE — Telephone Encounter (Signed)
INR 3.2

## 2014-03-01 ENCOUNTER — Other Ambulatory Visit: Payer: Self-pay | Admitting: Adult Health

## 2014-03-01 ENCOUNTER — Other Ambulatory Visit: Payer: Self-pay | Admitting: Cardiology

## 2014-03-13 ENCOUNTER — Ambulatory Visit (INDEPENDENT_AMBULATORY_CARE_PROVIDER_SITE_OTHER): Payer: Medicare Other | Admitting: Cardiology

## 2014-03-13 ENCOUNTER — Encounter: Payer: Self-pay | Admitting: Cardiology

## 2014-03-13 VITALS — BP 118/62 | HR 74 | Ht 66.0 in | Wt 154.0 lb

## 2014-03-13 DIAGNOSIS — I4891 Unspecified atrial fibrillation: Secondary | ICD-10-CM

## 2014-03-13 DIAGNOSIS — E785 Hyperlipidemia, unspecified: Secondary | ICD-10-CM

## 2014-03-13 DIAGNOSIS — I5033 Acute on chronic diastolic (congestive) heart failure: Secondary | ICD-10-CM

## 2014-03-13 DIAGNOSIS — I509 Heart failure, unspecified: Secondary | ICD-10-CM

## 2014-03-13 DIAGNOSIS — I2510 Atherosclerotic heart disease of native coronary artery without angina pectoris: Secondary | ICD-10-CM

## 2014-03-13 DIAGNOSIS — I38 Endocarditis, valve unspecified: Secondary | ICD-10-CM

## 2014-03-13 MED ORDER — ATORVASTATIN CALCIUM 80 MG PO TABS: 80.0000 mg | ORAL_TABLET | Freq: Every day | ORAL | Status: AC

## 2014-03-13 MED ORDER — TORSEMIDE 20 MG PO TABS: 60.0000 mg | ORAL_TABLET | Freq: Two times a day (BID) | ORAL | Status: AC

## 2014-03-13 NOTE — Patient Instructions (Addendum)
Your physician recommends that you schedule a follow-up appointment in: 3 months   Your physician has recommended you make the following change in your medication:     INCREASE Lipitor to 80 mg daily    Take Torsemide 80 mg twice a day for the next 3 days ////// THEN DECREASE to 60 mg twice a day     Please get labs now (BMET)     Thank you for choosing [facility] !

## 2014-03-13 NOTE — Progress Notes (Signed)
Clinical Summary [name] is a 68 y.o.male seen today for follow up of the following medical problems.   1. Diastolic heart failure - recent admission 02/2014 with nosebleed and supratherapeutic INR, along with pulmonary edema and resp failure requiring intubation. Discharge weight 148 lbs, remains stable at 148 lbs by his home scale  - reports breathing at baseline since discharge - compliant with torsemide 60mg  bid, has used metolazone prn when needed  2. CAD  - history of prior CABG 2003  - cath 01/2013 in setting of NSTEMI: LM patent, LAD prox 50% and 50% mid, D1 50%, LCX 30% mid, RCA 50% mid with more distal 99% lesion. SVG to diag occluded, SVG to PDA occluded, SVG to OM occluded, LIMA to LAD patent. The RCA received a DES  - compliant with plavix, has not been on ASA b/c also on coumadin to lower bleeding risk  - no recent chest pain   3. Valvular heart disease  - St Jude MVR and AVR, on chronic coumadin  - echo 07/2013 with normal function of both prosthetic valves per report  - compliant with coumadin  - denies any repeat issues with bleeding since hospital discharge for nosebleed  4. Afib  - denies any recent palpitations, he never feels palpitations  - on coumadin  5. Hyperlipidemia  - compliant with statin    Past Medical History  Diagnosis Date  . Essential hypertension, benign   . Coronary atherosclerosis of native coronary artery     a. CABG/MVR/AVR-2003 (#25 St. Jude/#21 St. Jude); anticoagulation; b. negative stress nuclear-2005;  c. 01/2013 NSTEMI/Cath/PCI: LM nl, LAD 50p, 40-36m, D1 50ost, LCX 13m, RCA 50/50m (2.5x20 Promus DES), 50d, VG->Diag 100, VG->PDA 100, VG->OM 100, LIMA->LAD ok.  . Epistaxis     Requiring cautery & aterial ligation-09/2009  . GERD (gastroesophageal reflux disease)   . Hyperlipemia   . Abnormal LFTs     Possible cirrhosis  . Chronic anticoagulation   . Valvular heart disease     a. 2003: MVR/AVR-2003 (#25 St. Jude/#21 St. Jude);   b. 01/2013 Echo: EF 55%, Mech AVR mean grad 12, Mech MVR mean grad 6.  . Tobacco abuse     45 pack years  . Fasting hyperglycemia   . Nephrolithiasis   . Diverticulosis   . Hyponatremia   . Chronic diastolic CHF (congestive heart failure)   . Chronic renal disease   . Hemolytic anemia   . ETOH abuse   . Atrial fibrillation 01/16/2013    On coumadin DCCV 07/2013.   Marland Kitchen COPD (chronic obstructive pulmonary disease)      No Known Allergies   Current Outpatient Prescriptions  Medication Sig Dispense Refill  . acetaminophen (TYLENOL) 500 MG tablet Take 1,000 mg by mouth daily as needed for mild pain or headache.      Marland Kitchen amLODipine (NORVASC) 10 MG tablet Take 10 mg by mouth daily.       Marland Kitchen amLODipine (NORVASC) 10 MG tablet TAKE ONE TABLET BY MOUTH ONCE DAILY AT BEDTIME  30 tablet  3  . atorvastatin (LIPITOR) 40 MG tablet Take 40 mg by mouth daily. This Replace Zocor(Simvastatin)      . clopidogrel (PLAVIX) 75 MG tablet Take 1 tablet (75 mg total) by mouth daily with breakfast.  30 tablet  6  . clopidogrel (PLAVIX) 75 MG tablet TAKE ONE TABLET BY MOUTH DAILY WITH BREAKFAST  30 tablet  3  . ferrous sulfate 325 (65 FE) MG EC tablet Take  325 mg by mouth daily with breakfast.      . folic acid (FOLVITE) 1 MG tablet Take 1 tablet (1 mg total) by mouth daily.  30 tablet  4  . levalbuterol (XOPENEX) 0.63 MG/3ML nebulizer solution Take 3 mLs (0.63 mg total) by nebulization every 6 (six) hours as needed for wheezing or shortness of breath.  3 mL  4  . loperamide (IMODIUM) 2 MG capsule Take 2 capsules (4 mg total) by mouth every 12 (twelve) hours as needed for diarrhea or loose stools.  30 capsule  0  . magnesium oxide (MAG-OX) 400 (241.3 MG) MG tablet Take 1 tablet (400 mg total) by mouth daily.  30 tablet  6  . metolazone (ZAROXOLYN) 2.5 MG tablet Take 2.5 mg (1 tablet) every week as needed for swelling.  12 tablet  3  . metoprolol (LOPRESSOR) 100 MG tablet TAKE ONE TABLET BY MOUTH TWICE DAILY  60  tablet  11  . Multiple Vitamin (MULTIVITAMIN WITH MINERALS) TABS Take 1 tablet by mouth daily.      . nitroGLYCERIN (NITROSTAT) 0.4 MG SL tablet Place 1 tablet (0.4 mg total) under the tongue every 5 (five) minutes x 3 doses as needed for chest pain.  25 tablet  3  . pantoprazole (PROTONIX) 40 MG tablet Take 40 mg by mouth daily.      . potassium chloride SA (K-DUR,KLOR-CON) 20 MEQ tablet Take 1 tablet (20 mEq total) by mouth daily.  90 tablet  3  . thiamine 100 MG tablet Take 1 tablet (100 mg total) by mouth daily.  30 tablet  4  . torsemide (DEMADEX) 20 MG tablet Take 3 tablets (60 mg total) by mouth 2 (two) times daily.  180 tablet  6  . warfarin (COUMADIN) 5 MG tablet Take 5-7.5 mg by mouth daily. Takes 7.5mg  on Mondays & 5mg  all other days of week       No current facility-administered medications for this visit.     Past Surgical History  Procedure Laterality Date  . Endocopic sphenopalatine artery ligation & cautry    . Inguinal hernia repair      Left & right  . Coronary artery bypass graft  11/2001    [facility]  . Cardiac valve replacement  11/2001    AVR and MVR-St. Jude devices  . Cataract extraction w/phaco  01/20/2011    Procedure: CATARACT EXTRACTION PHACO AND INTRAOCULAR LENS PLACEMENT (IOC);  Surgeon: Elta Guadeloupe T. Gershon Crane;  Location: [facility];  Service: Ophthalmology;  Laterality: Right;  CDE: 8.51  . Cataract extraction w/phaco  02/03/2011    Procedure: CATARACT EXTRACTION PHACO AND INTRAOCULAR LENS PLACEMENT (IOC);  Surgeon: Elta Guadeloupe T. Gershon Crane;  Location: [facility];  Service: Ophthalmology;  Laterality: Left;  CDE:10.01  . Colonoscopy  04/05/02; 08/2011    friable anal canal hemorrhoids otherwise normal; 2 diminutive polyps excised, minimal diverticulosis noted  . Esophagogastroduodenoscopy  01/2002    Dr. Laural Golden, submucosal esophageal lesion c/w leiomyoma  . Colonoscopy  09/02/2011    Procedure: COLONOSCOPY;  Surgeon: Daneil Dolin, MD;  Location: [facility];  Service:  Endoscopy;  Laterality: N/A;  8:15  . Yag laser application Right 11/94/1740    Procedure: YAG LASER APPLICATION;  Surgeon: Elta Guadeloupe T. Gershon Crane, MD;  Location: [facility];  Service: Ophthalmology;  Laterality: Right;     No Known Allergies    Family History  Problem Relation Age of Onset  . Hypotension Neg Hx   . Anesthesia problems Neg Hx   .  Malignant hyperthermia Neg Hx   . Pseudochol deficiency Neg Hx   . Colon cancer Neg Hx   . Liver disease Neg Hx      Social History Mr. Wadsworth reports that he quit smoking about 2 months ago. His smoking use included Cigarettes. He has a 13 pack-year smoking history. He quit smokeless tobacco use about 3 months ago. Mr. Gunderson reports that he drinks about 4.2 ounces of alcohol per week.   Review of Systems CONSTITUTIONAL: No weight loss, fever, chills, weakness or fatigue.  HEENT: Eyes: No visual loss, blurred vision, double vision or yellow sclerae.No hearing loss, sneezing, congestion, runny nose or sore throat.  SKIN: No rash or itching.  CARDIOVASCULAR:  RESPIRATORY: No shortness of breath, cough or sputum.  GASTROINTESTINAL: No anorexia, nausea, vomiting or diarrhea. No abdominal pain or blood.  GENITOURINARY: No burning on urination, no polyuria NEUROLOGICAL: No headache, dizziness, syncope, paralysis, ataxia, numbness or tingling in the extremities. No change in bowel or bladder control.  MUSCULOSKELETAL: No muscle, back pain, joint pain or stiffness.  LYMPHATICS: No enlarged nodes. No history of splenectomy.  PSYCHIATRIC: No history of depression or anxiety.  ENDOCRINOLOGIC: No reports of sweating, cold or heat intolerance. No polyuria or polydipsia.  Marland Kitchen   Physical Examination p 74 bp 118/62 Wt 154 lbs BMI 25 Gen: resting comfortably, no acute distress HEENT: no scleral icterus, pupils equal round and reactive, no palptable cervical adenopathy,  CV: irreg, mechanical S1/S2, no m/r/g, JVD just below angle of jaw with + hepatojugular  reflex Resp: Clear to auscultation bilaterally GI: abdomen is soft, non-tender, non-distended, normal bowel sounds, no hepatosplenomegaly MSK: extremities are warm, no edema.  Skin: warm, no rash Neuro:  no focal deficits Psych: appropriate affect   Diagnostic Studies 07/2013 Echo  LVEF 50-55%, normal AVR, normal MVR,  01/2013 Cath  Hemodynamics:  AO: 145/59 mmHg  Coronary angiography:  Coronary dominance:  Left Main: Short in with no significant disease.  Left Anterior Descending (LAD): Normal in size with moderate calcifications proximally. There is 50% disease proximally just before the diagonal [name]. The vessel is very tortuous in the midsegment with diffuse 40-50% disease. There is competitive flow noted distally from a patent LIMA.  1st diagonal (D1): Large in size with 50% ostial and proximal disease. This vessel is large and supplies most of the anterolateral wall.  2nd diagonal (D2): Very small in size.  3rd diagonal (D3): Very small in size.  Circumflex (LCx): Normal in size and nondominant. The vessel is mildly calcified. There is 30 % tubular disease in the midsegment.  1st obtuse marginal: Very small in size with minor irregularities.  2nd obtuse marginal: Small in size with 60% ostial disease.  3rd obtuse marginal: Normal in size with 80% proximal disease. The vessel is very tortuous in that segment and distally.  AV groove continuation segment: Normal in size with minor irregularities.  Right Coronary Artery: Normal in size and dominant. The vessel is moderately calcified throughout its course. There is diffuse 50% disease in the midsegment. There is another lesion of 99% in the midsegment just before giving a large [name] [name]. There is 50% disease distally.  Posterior descending artery: Normal in size with minor irregularities.  Posterior AV segment: Small in size. SVG to diagonal: Occluded proximally. I suspect that this is the culprit for non-ST elevation MI due to dye  stain noted.  SVG to right PDA: Occluded proximally.  SVG to OM: Is occluded proximally.  LIMA to LAD: Patent  with no significant disease.  Left ventriculography: Was not performed.  PCI Procedure Note: Following the diagnostic procedure, the decision was made to proceed with PCI. I reviewed the images with Dr. Burt Knack. The sheath was upsized to a 6 [place]. Weight-based bivalirudin was given for anticoagulation. Once a therapeutic ACT was achieved, a 6 [place] JR 4 guide catheter was inserted. A run through coronary guidewire was used to cross the lesion. The lesion was predilated with a 2.5 x 12 mm balloon. After crossing the lesion, there was no flow in the distal RCA likely due to severity of stenosis. There was sluggish flow or send [name]. Thus, I used another run through a wire and placed in the [name] [name] for protection. I then tried to advance a 2.5 x 20 mm Promus drug-eluting stent into the distal RCA but was not successful due to tortuosity and calcifications. This was in spite of having to I asked. I decided to predilate the lesion with a 2.5 noncompliant balloon which was done with multiple inflations. I was then able to advance the stent after deep seating the guide. The stent was deployed at 12 atmospheres. I then removed the wire from the [name] [name] and post dilated the stent with a 2.5 noncompliant balloon to 16 atmospheres. Following PCI, there was 0% residual stenosis and TIMI-3 flow. Final angiography confirmed an excellent result. There was still moderate disease in the mid RCA proximal to the stent which improved after removing the wires likely due to wire bias. This was left to be treated medically. Femoral hemostasis was achieved with a Mynx device. The patient tolerated the PCI procedure well. There were no immediate procedural complications. The patient was transferred to the post catheterization recovery area for further monitoring.  PCI Data:  Vessel - mid RCA/Segment - 2  Percent  Stenosis (pre) 99%  TIMI-flow 3  Stent 2.5 x 20 mm Promus drug-eluting stent  Percent Stenosis (post) 0%  TIMI-flow (post) 3  Final Conclusions:  1. Significant three-vessel coronary artery disease with occluded vein grafts. Patent LIMA to LAD. The culprit for non-ST elevation myocardial infarction is likely occluded SVG to diagonal. However, the native diagonal is large and does not seem to have obstructive disease. There is significant disease in proximal OM 3. However, the vessel is tortuous and calcified. Severe 99% stenosis in mid RCA at the bifurcation with a large [name].  2. Successful angioplasty and drug-eluting stent placement to the mid RCA.  Recommendations:  I recommend treatment with Plavix and warfarin alone without aspirin to minimize risk of bleeding. I will resume heparin 8 hours from now and resume warfarin as well. Obtain an echocardiogram to evaluate LV systolic function and the function of mechanical valves. Recommend rate control for atrial fibrillation. I increased the dose of metoprolol.  Recommend medical therapy for that disease in OM3 which is tortuous and calcified  08/17/13  Nuclear stress test  Tomographic views were obtained using the short axis, vertical long axis, and horizontal long axis planes. There is a moderate-sized, mild to moderate intensity, reversible defect in the lateral wall that is consistent with ischemia. There is also a small, mild intensity, partially reversible inferior wall defect that is consistent with scar and associated mild peri-infarct ischemia.  Gated imaging reveals an EDV of 147, ESV of 90, TID ratio 0.99, and LVEF calculated at 39% with inferoposterior hypokinesis.  IMPRESSION: Intermediate risk Lexiscan Cardiolite. Nondiagnostic ST segment changes were noted, rare PVCs without sustained arrhythmia. Sinus rhythm  was present throughout. Perfusion imaging is consistent with ischemia within the lateral wall as outlined,  combination of scar and mild peri-infarct ischemia in the inferior wall. LVEF 39% with inferoposterior hypokinesis and overall normal LV volume. This is consistent with ischemic heart disease and associated cardiomyopathy.        Assessment and Plan  1. Acute on chronic diastolic heart failure  - recent admit with pulmonary edema - stable weight since discharge - by exam appears mildly volume overloaded, will increase torsemide to 80mg  bid for 3 days then back to 60mg  bid - repeat BMET due to AKI in hospital   2. CAD  - no recent chest pain  - recent MPI intermediate risk some areas of ischemia in the lateral wall and inferior wall.  - continue medical management  - continue plavix, not on ASA b/c on coumadin as well.   3. Valvular heart disease  - prior MVR and AVR  - recent echo with normal functioning prosthetic valves  - continue coumadin.   4. Afib  - no current symptoms  - recent 24 hr holter shows avg rates in 70s, rang mid 40s-100s. - continue current meds  5. Hyperlipidemia  - given history of CAD will increase to high dose statin with atrova 80mg  daily.    6. Recent AKI - Cr bumped to 1.9 during recent admit, baseline appears to be 1.2-1.3 - repeat BMET     Arnoldo Lenis, M.D., F.A.C.C.

## 2014-03-14 LAB — BASIC METABOLIC PANEL
BUN: 72 mg/dL — ABNORMAL HIGH (ref 6–23)
CALCIUM: 9.4 mg/dL (ref 8.4–10.5)
CHLORIDE: 90 meq/L — AB (ref 96–112)
CO2: 35 meq/L — AB (ref 19–32)
Creat: 1.63 mg/dL — ABNORMAL HIGH (ref 0.50–1.35)
Glucose, Bld: 96 mg/dL (ref 70–99)
Potassium: 4.4 mEq/L (ref 3.5–5.3)
Sodium: 135 mEq/L (ref 135–145)

## 2014-03-21 ENCOUNTER — Ambulatory Visit (INDEPENDENT_AMBULATORY_CARE_PROVIDER_SITE_OTHER): Payer: Medicare Other | Admitting: *Deleted

## 2014-03-21 DIAGNOSIS — Z5181 Encounter for therapeutic drug level monitoring: Secondary | ICD-10-CM

## 2014-03-21 DIAGNOSIS — Z954 Presence of other heart-valve replacement: Secondary | ICD-10-CM

## 2014-03-21 DIAGNOSIS — Z9889 Other specified postprocedural states: Secondary | ICD-10-CM

## 2014-03-21 LAB — POCT INR: INR: 4

## 2014-04-04 ENCOUNTER — Ambulatory Visit (INDEPENDENT_AMBULATORY_CARE_PROVIDER_SITE_OTHER): Payer: Medicare Other | Admitting: *Deleted

## 2014-04-04 DIAGNOSIS — Z5181 Encounter for therapeutic drug level monitoring: Secondary | ICD-10-CM

## 2014-04-04 DIAGNOSIS — Z954 Presence of other heart-valve replacement: Secondary | ICD-10-CM

## 2014-04-04 DIAGNOSIS — Z9889 Other specified postprocedural states: Secondary | ICD-10-CM

## 2014-04-04 LAB — POCT INR: INR: 5.4

## 2014-04-16 ENCOUNTER — Ambulatory Visit (INDEPENDENT_AMBULATORY_CARE_PROVIDER_SITE_OTHER): Payer: Medicare Other | Admitting: *Deleted

## 2014-04-16 DIAGNOSIS — Z9889 Other specified postprocedural states: Secondary | ICD-10-CM

## 2014-04-16 DIAGNOSIS — Z952 Presence of prosthetic heart valve: Secondary | ICD-10-CM

## 2014-04-16 DIAGNOSIS — Z5181 Encounter for therapeutic drug level monitoring: Secondary | ICD-10-CM

## 2014-04-16 DIAGNOSIS — Z954 Presence of other heart-valve replacement: Secondary | ICD-10-CM

## 2014-04-16 LAB — POCT INR: INR: 4.4

## 2014-05-02 ENCOUNTER — Encounter: Payer: Self-pay | Admitting: *Deleted

## 2014-05-02 ENCOUNTER — Ambulatory Visit (INDEPENDENT_AMBULATORY_CARE_PROVIDER_SITE_OTHER): Payer: Medicare Other | Admitting: *Deleted

## 2014-05-02 DIAGNOSIS — Z9889 Other specified postprocedural states: Secondary | ICD-10-CM

## 2014-05-02 DIAGNOSIS — Z952 Presence of prosthetic heart valve: Secondary | ICD-10-CM

## 2014-05-02 DIAGNOSIS — Z5181 Encounter for therapeutic drug level monitoring: Secondary | ICD-10-CM

## 2014-05-02 DIAGNOSIS — Z954 Presence of other heart-valve replacement: Secondary | ICD-10-CM

## 2014-05-02 LAB — POCT INR: INR: 2.4

## 2014-05-08 ENCOUNTER — Other Ambulatory Visit: Payer: Self-pay

## 2014-05-08 ENCOUNTER — Other Ambulatory Visit: Payer: Self-pay | Admitting: Adult Health

## 2014-05-08 MED ORDER — WARFARIN SODIUM 5 MG PO TABS: 5.0000 mg | ORAL_TABLET | Freq: Every day | ORAL | Status: AC

## 2014-05-13 ENCOUNTER — Encounter (HOSPITAL_COMMUNITY): Payer: Self-pay | Admitting: *Deleted

## 2014-05-13 ENCOUNTER — Emergency Department (HOSPITAL_COMMUNITY): Payer: Medicare Other

## 2014-05-13 ENCOUNTER — Inpatient Hospital Stay (HOSPITAL_COMMUNITY)
Admission: EM | Admit: 2014-05-13 | Discharge: 2014-06-05 | DRG: 150 | Disposition: E | Payer: Medicare Other | Attending: Pulmonary Disease | Admitting: Pulmonary Disease

## 2014-05-13 DIAGNOSIS — I2609 Other pulmonary embolism with acute cor pulmonale: Secondary | ICD-10-CM | POA: Diagnosis present

## 2014-05-13 DIAGNOSIS — D589 Hereditary hemolytic anemia, unspecified: Secondary | ICD-10-CM | POA: Diagnosis present

## 2014-05-13 DIAGNOSIS — K219 Gastro-esophageal reflux disease without esophagitis: Secondary | ICD-10-CM | POA: Diagnosis present

## 2014-05-13 DIAGNOSIS — T501X5A Adverse effect of loop [high-ceiling] diuretics, initial encounter: Secondary | ICD-10-CM | POA: Diagnosis present

## 2014-05-13 DIAGNOSIS — Z9841 Cataract extraction status, right eye: Secondary | ICD-10-CM

## 2014-05-13 DIAGNOSIS — R4182 Altered mental status, unspecified: Secondary | ICD-10-CM

## 2014-05-13 DIAGNOSIS — Z9289 Personal history of other medical treatment: Secondary | ICD-10-CM | POA: Insufficient documentation

## 2014-05-13 DIAGNOSIS — D509 Iron deficiency anemia, unspecified: Secondary | ICD-10-CM | POA: Diagnosis present

## 2014-05-13 DIAGNOSIS — I5033 Acute on chronic diastolic (congestive) heart failure: Secondary | ICD-10-CM | POA: Diagnosis present

## 2014-05-13 DIAGNOSIS — Z66 Do not resuscitate: Secondary | ICD-10-CM

## 2014-05-13 DIAGNOSIS — J9621 Acute and chronic respiratory failure with hypoxia: Secondary | ICD-10-CM | POA: Diagnosis present

## 2014-05-13 DIAGNOSIS — J80 Acute respiratory distress syndrome: Secondary | ICD-10-CM | POA: Insufficient documentation

## 2014-05-13 DIAGNOSIS — I482 Chronic atrial fibrillation: Secondary | ICD-10-CM | POA: Diagnosis present

## 2014-05-13 DIAGNOSIS — D638 Anemia in other chronic diseases classified elsewhere: Secondary | ICD-10-CM | POA: Diagnosis present

## 2014-05-13 DIAGNOSIS — Z515 Encounter for palliative care: Secondary | ICD-10-CM | POA: Diagnosis not present

## 2014-05-13 DIAGNOSIS — J9601 Acute respiratory failure with hypoxia: Secondary | ICD-10-CM | POA: Diagnosis present

## 2014-05-13 DIAGNOSIS — J9602 Acute respiratory failure with hypercapnia: Secondary | ICD-10-CM

## 2014-05-13 DIAGNOSIS — I129 Hypertensive chronic kidney disease with stage 1 through stage 4 chronic kidney disease, or unspecified chronic kidney disease: Secondary | ICD-10-CM | POA: Diagnosis present

## 2014-05-13 DIAGNOSIS — F1099 Alcohol use, unspecified with unspecified alcohol-induced disorder: Secondary | ICD-10-CM | POA: Diagnosis present

## 2014-05-13 DIAGNOSIS — R0602 Shortness of breath: Secondary | ICD-10-CM | POA: Diagnosis present

## 2014-05-13 DIAGNOSIS — J690 Pneumonitis due to inhalation of food and vomit: Secondary | ICD-10-CM | POA: Diagnosis present

## 2014-05-13 DIAGNOSIS — Z01818 Encounter for other preprocedural examination: Secondary | ICD-10-CM

## 2014-05-13 DIAGNOSIS — R040 Epistaxis: Secondary | ICD-10-CM | POA: Diagnosis present

## 2014-05-13 DIAGNOSIS — N179 Acute kidney failure, unspecified: Secondary | ICD-10-CM | POA: Diagnosis present

## 2014-05-13 DIAGNOSIS — Z6826 Body mass index (BMI) 26.0-26.9, adult: Secondary | ICD-10-CM

## 2014-05-13 DIAGNOSIS — Z951 Presence of aortocoronary bypass graft: Secondary | ICD-10-CM | POA: Diagnosis not present

## 2014-05-13 DIAGNOSIS — E872 Acidosis: Secondary | ICD-10-CM

## 2014-05-13 DIAGNOSIS — J449 Chronic obstructive pulmonary disease, unspecified: Secondary | ICD-10-CM | POA: Diagnosis present

## 2014-05-13 DIAGNOSIS — I252 Old myocardial infarction: Secondary | ICD-10-CM | POA: Diagnosis not present

## 2014-05-13 DIAGNOSIS — I2510 Atherosclerotic heart disease of native coronary artery without angina pectoris: Secondary | ICD-10-CM | POA: Diagnosis present

## 2014-05-13 DIAGNOSIS — Z954 Presence of other heart-valve replacement: Secondary | ICD-10-CM

## 2014-05-13 DIAGNOSIS — Z7901 Long term (current) use of anticoagulants: Secondary | ICD-10-CM | POA: Diagnosis not present

## 2014-05-13 DIAGNOSIS — Z79899 Other long term (current) drug therapy: Secondary | ICD-10-CM

## 2014-05-13 DIAGNOSIS — Z952 Presence of prosthetic heart valve: Secondary | ICD-10-CM

## 2014-05-13 DIAGNOSIS — Z452 Encounter for adjustment and management of vascular access device: Secondary | ICD-10-CM

## 2014-05-13 DIAGNOSIS — I509 Heart failure, unspecified: Secondary | ICD-10-CM | POA: Insufficient documentation

## 2014-05-13 DIAGNOSIS — E870 Hyperosmolality and hypernatremia: Secondary | ICD-10-CM | POA: Diagnosis not present

## 2014-05-13 DIAGNOSIS — E785 Hyperlipidemia, unspecified: Secondary | ICD-10-CM | POA: Diagnosis present

## 2014-05-13 DIAGNOSIS — D688 Other specified coagulation defects: Secondary | ICD-10-CM | POA: Diagnosis present

## 2014-05-13 DIAGNOSIS — Z87442 Personal history of urinary calculi: Secondary | ICD-10-CM

## 2014-05-13 DIAGNOSIS — T85598A Other mechanical complication of other gastrointestinal prosthetic devices, implants and grafts, initial encounter: Secondary | ICD-10-CM

## 2014-05-13 DIAGNOSIS — Z7902 Long term (current) use of antithrombotics/antiplatelets: Secondary | ICD-10-CM | POA: Diagnosis not present

## 2014-05-13 DIAGNOSIS — E871 Hypo-osmolality and hyponatremia: Secondary | ICD-10-CM | POA: Diagnosis present

## 2014-05-13 DIAGNOSIS — Z961 Presence of intraocular lens: Secondary | ICD-10-CM | POA: Diagnosis present

## 2014-05-13 DIAGNOSIS — G9341 Metabolic encephalopathy: Secondary | ICD-10-CM | POA: Diagnosis present

## 2014-05-13 DIAGNOSIS — Z9889 Other specified postprocedural states: Secondary | ICD-10-CM

## 2014-05-13 DIAGNOSIS — E440 Moderate protein-calorie malnutrition: Secondary | ICD-10-CM | POA: Diagnosis present

## 2014-05-13 DIAGNOSIS — R0902 Hypoxemia: Secondary | ICD-10-CM

## 2014-05-13 DIAGNOSIS — J9620 Acute and chronic respiratory failure, unspecified whether with hypoxia or hypercapnia: Secondary | ICD-10-CM | POA: Diagnosis present

## 2014-05-13 DIAGNOSIS — I10 Essential (primary) hypertension: Secondary | ICD-10-CM | POA: Diagnosis present

## 2014-05-13 DIAGNOSIS — N183 Chronic kidney disease, stage 3 unspecified: Secondary | ICD-10-CM | POA: Diagnosis present

## 2014-05-13 DIAGNOSIS — I4891 Unspecified atrial fibrillation: Secondary | ICD-10-CM | POA: Diagnosis present

## 2014-05-13 DIAGNOSIS — J9600 Acute respiratory failure, unspecified whether with hypoxia or hypercapnia: Secondary | ICD-10-CM

## 2014-05-13 DIAGNOSIS — T884XXA Failed or difficult intubation, initial encounter: Secondary | ICD-10-CM

## 2014-05-13 DIAGNOSIS — R34 Anuria and oliguria: Secondary | ICD-10-CM | POA: Diagnosis present

## 2014-05-13 HISTORY — DX: Chronic kidney disease, stage 3 unspecified: N18.30

## 2014-05-13 HISTORY — DX: Chronic kidney disease, stage 3 (moderate): N18.3

## 2014-05-13 LAB — PROTIME-INR
INR: 2.28 — AB (ref 0.00–1.49)
Prothrombin Time: 25.4 seconds — ABNORMAL HIGH (ref 11.6–15.2)

## 2014-05-13 LAB — CBC
HEMATOCRIT: 33.3 % — AB (ref 39.0–52.0)
HEMOGLOBIN: 10.3 g/dL — AB (ref 13.0–17.0)
MCH: 28.4 pg (ref 26.0–34.0)
MCHC: 30.9 g/dL (ref 30.0–36.0)
MCV: 91.7 fL (ref 78.0–100.0)
Platelets: 231 10*3/uL (ref 150–400)
RBC: 3.63 MIL/uL — AB (ref 4.22–5.81)
RDW: 17.4 % — ABNORMAL HIGH (ref 11.5–15.5)
WBC: 6.8 10*3/uL (ref 4.0–10.5)

## 2014-05-13 LAB — BASIC METABOLIC PANEL
ANION GAP: 10 (ref 5–15)
BUN: 50 mg/dL — ABNORMAL HIGH (ref 6–23)
CHLORIDE: 94 meq/L — AB (ref 96–112)
CO2: 29 meq/L (ref 19–32)
Calcium: 8.6 mg/dL (ref 8.4–10.5)
Creatinine, Ser: 1.41 mg/dL — ABNORMAL HIGH (ref 0.50–1.35)
GFR calc Af Amer: 58 mL/min — ABNORMAL LOW (ref >90)
GFR calc non Af Amer: 50 mL/min — ABNORMAL LOW (ref >90)
Glucose, Bld: 92 mg/dL (ref 70–99)
POTASSIUM: 4.7 meq/L (ref 3.7–5.3)
SODIUM: 133 meq/L — AB (ref 137–147)

## 2014-05-13 LAB — TROPONIN I: Troponin I: <0.3 ng/mL (ref <0.30)

## 2014-05-13 LAB — PRO B NATRIURETIC PEPTIDE: Pro B Natriuretic peptide (BNP): 13471 pg/mL — ABNORMAL HIGH (ref 0–125)

## 2014-05-13 MED ORDER — SILVER NITRATE-POT NITRATE 75-25 % EX MISC
CUTANEOUS | Status: AC
  Administered 2014-05-13: 22:00:00
  Filled 2014-05-13: qty 1

## 2014-05-13 MED ORDER — FUROSEMIDE 10 MG/ML IJ SOLN
80.0000 mg | Freq: Once | INTRAMUSCULAR | Status: AC
  Administered 2014-05-14: 80 mg via INTRAVENOUS
  Filled 2014-05-13: qty 8

## 2014-05-13 MED ORDER — LIDOCAINE VISCOUS 2 % MT SOLN
OROMUCOSAL | Status: AC
  Filled 2014-05-13: qty 15

## 2014-05-13 MED ORDER — OXYMETAZOLINE HCL 0.05 % NA SOLN
NASAL | Status: AC
  Filled 2014-05-13: qty 15

## 2014-05-13 NOTE — ED Provider Notes (Signed)
CSN: [id number]     Arrival date & time 05/07/2014  1952 History  This chart was scribed for [employer]. Alvino Chapel, MD by Randa Evens, ED Scribe. This patient was seen in room APA15/APA15 and the patient's care was started at 8:04 PM.    Chief Complaint  Patient presents with  . Epistaxis   Patient is a 68 y.o. male presenting with nosebleeds. The history is provided by the patient. No language interpreter was used.  Epistaxis Associated symptoms: no dizziness    HPI Comments: [name] is a 68 y.o. male who presents to the Emergency Department complaining of right sided epistaxis onset 1 hour PTA. Pt states that his nose was bleeding constantly for an hour. Pt states that he is on Plavix and coumadin. Pt states that he is on 2L of oxygen at home. He states he has a Hx or right nostril cauterization. He denies light headedness or dizziness.    Past Medical History  Diagnosis Date  . Essential hypertension, benign   . Coronary atherosclerosis of native coronary artery     a. CABG/MVR/AVR-2003 (#25 St. Jude/#21 St. Jude); anticoagulation; b. negative stress nuclear-2005;  c. 01/2013 NSTEMI/Cath/PCI: LM nl, LAD 50p, 40-97m, D1 50ost, LCX 76m, RCA 50/59m (2.5x20 Promus DES), 50d, VG->Diag 100, VG->PDA 100, VG->OM 100, LIMA->LAD ok.  . Epistaxis     Requiring cautery & aterial ligation-09/2009  . GERD (gastroesophageal reflux disease)   . Hyperlipemia   . Abnormal LFTs     Possible cirrhosis  . Chronic anticoagulation   . Valvular heart disease     a. 2003: MVR/AVR-2003 (#25 St. Jude/#21 St. Jude);  b. 01/2013 Echo: EF 55%, Mech AVR mean grad 12, Mech MVR mean grad 6.  . Tobacco abuse     45 pack years  . Fasting hyperglycemia   . Nephrolithiasis   . Diverticulosis   . Hyponatremia   . Chronic diastolic CHF (congestive heart failure)   . Chronic renal disease   . Hemolytic anemia   . ETOH abuse   . Atrial fibrillation 01/16/2013    On coumadin DCCV 07/2013.   Marland Kitchen COPD (chronic  obstructive pulmonary disease)    Past Surgical History  Procedure Laterality Date  . Endocopic sphenopalatine artery ligation & cautry    . Inguinal hernia repair      Left & right  . Coronary artery bypass graft  11/2001    [facility]  . Cardiac valve replacement  11/2001    AVR and MVR-St. Jude devices  . Cataract extraction w/phaco  01/20/2011    Procedure: CATARACT EXTRACTION PHACO AND INTRAOCULAR LENS PLACEMENT (IOC);  Surgeon: Elta Guadeloupe T. Gershon Crane;  Location: [facility];  Service: Ophthalmology;  Laterality: Right;  CDE: 8.51  . Cataract extraction w/phaco  02/03/2011    Procedure: CATARACT EXTRACTION PHACO AND INTRAOCULAR LENS PLACEMENT (IOC);  Surgeon: Elta Guadeloupe T. Gershon Crane;  Location: [facility];  Service: Ophthalmology;  Laterality: Left;  CDE:10.01  . Colonoscopy  04/05/02; 08/2011    friable anal canal hemorrhoids otherwise normal; 2 diminutive polyps excised, minimal diverticulosis noted  . Esophagogastroduodenoscopy  01/2002    Dr. Laural Golden, submucosal esophageal lesion c/w leiomyoma  . Colonoscopy  09/02/2011    Procedure: COLONOSCOPY;  Surgeon: Daneil Dolin, MD;  Location: [facility];  Service: Endoscopy;  Laterality: N/A;  8:15  . Yag laser application Right 01/60/1093    Procedure: YAG LASER APPLICATION;  Surgeon: Elta Guadeloupe T. Gershon Crane, MD;  Location: [facility];  Service: Ophthalmology;  Laterality: Right;   Family History  Problem Relation Age of Onset  . Hypotension Neg Hx   . Anesthesia problems Neg Hx   . Malignant hyperthermia Neg Hx   . Pseudochol deficiency Neg Hx   . Colon cancer Neg Hx   . Liver disease Neg Hx    History  Substance Use Topics  . Smoking status: Former Smoker -- 0.25 packs/day for 52 years    Types: Cigarettes    Quit date: 01/03/2014  . Smokeless tobacco: Former Systems developer    Quit date: 12/04/2013     Comment: STATES HE IS CUTTING DOWN now only 1/2 ppd (01/27/2013)  . Alcohol Use: 4.2 oz/week    6 Cans of beer, 1 Shots of liquor per week     Comment: Daily     Review of Systems  HENT: Positive for nosebleeds.   Neurological: Negative for dizziness and light-headedness.     Allergies  Review of patient's allergies indicates no known allergies.  Home Medications   Prior to Admission medications   Medication Sig Start Date End Date Taking? Authorizing Provider  amLODipine (NORVASC) 10 MG tablet Take 10 mg by mouth at bedtime.  12/15/13  Yes Historical Provider, MD  atorvastatin (LIPITOR) 80 MG tablet Take 1 tablet (80 mg total) by mouth daily. 03/13/14  Yes Arnoldo Lenis, MD  clopidogrel (PLAVIX) 75 MG tablet Take 75 mg by mouth daily.   Yes Historical Provider, MD  ferrous sulfate 325 (65 FE) MG EC tablet Take 325 mg by mouth daily with breakfast.   Yes Historical Provider, MD  folic acid (FOLVITE) 1 MG tablet Take 1 tablet (1 mg total) by mouth daily. 01/22/13  Yes Brooke O Edmisten, PA-C  levalbuterol (XOPENEX) 0.63 MG/3ML nebulizer solution Take 3 mLs (0.63 mg total) by nebulization every 6 (six) hours as needed for wheezing or shortness of breath. 01/22/13  Yes Brooke O Edmisten, PA-C  loperamide (IMODIUM) 2 MG capsule Take 2 capsules (4 mg total) by mouth every 12 (twelve) hours as needed for diarrhea or loose stools. 12/06/13  Yes Alonza Bogus, MD  magnesium oxide (MAG-OX) 400 MG tablet Take 400 mg by mouth daily.   Yes Historical Provider, MD  metolazone (ZAROXOLYN) 2.5 MG tablet Take 2.5 mg by mouth as needed (Fluid).   Yes Historical Provider, MD  metoprolol (LOPRESSOR) 100 MG tablet Take 100 mg by mouth 2 (two) times daily.   Yes Historical Provider, MD  Multiple Vitamin (MULTIVITAMIN WITH MINERALS) TABS Take 1 tablet by mouth daily. 01/22/13  Yes Brooke O Edmisten, PA-C  pantoprazole (PROTONIX) 40 MG tablet Take 40 mg by mouth daily.   Yes Historical Provider, MD  potassium chloride SA (K-DUR,KLOR-CON) 20 MEQ tablet Take 1 tablet (20 mEq total) by mouth daily. 12/18/13  Yes Arnoldo Lenis, MD  thiamine 100 MG tablet Take 1 tablet  (100 mg total) by mouth daily. 01/22/13  Yes Brooke O Edmisten, PA-C  torsemide (DEMADEX) 20 MG tablet Take 3 tablets (60 mg total) by mouth 2 (two) times daily. 03/13/14  Yes Arnoldo Lenis, MD  warfarin (COUMADIN) 5 MG tablet Take 2.5-5 mg by mouth daily. Takes 5 mg on Mon, Wed, Fri and takes 2.5 mg all other days.   Yes Historical Provider, MD  acetaminophen (TYLENOL) 500 MG tablet Take 1,000 mg by mouth daily as needed for mild pain or headache.    Historical Provider, MD  clopidogrel (PLAVIX) 75 MG tablet TAKE ONE TABLET BY MOUTH DAILY WITH  BREAKFAST Patient not taking: Reported on 05/20/2014 03/01/14   Arnoldo Lenis, MD  magnesium oxide (MAG-OX) 400 (241.3 MG) MG tablet TAKE ONE TABLET BY MOUTH ONCE DAILY Patient not taking: Reported on 05/07/2014 05/08/14   Arnoldo Lenis, MD  metolazone (ZAROXOLYN) 2.5 MG tablet Take 2.5 mg (1 tablet) every week as needed for swelling. Patient not taking: Reported on 05/22/2014 01/26/14   Arnoldo Lenis, MD  metoprolol (LOPRESSOR) 100 MG tablet TAKE ONE TABLET BY MOUTH TWICE DAILY Patient not taking: Reported on 05/11/2014 03/01/14   Arnoldo Lenis, MD  nitroGLYCERIN (NITROSTAT) 0.4 MG SL tablet Place 1 tablet (0.4 mg total) under the tongue every 5 (five) minutes x 3 doses as needed for chest pain. 01/31/13   Rogelia Mire, NP  warfarin (COUMADIN) 5 MG tablet Take 1-1.5 tablets (5-7.5 mg total) by mouth daily. Takes 7.5mg  on Mondays & 5mg  all other days of week Patient not taking: Reported on 05/10/2014 05/08/14   Arnoldo Lenis, MD   Triage Vitals: BP 90/45 mmHg  Pulse 88  Ht 5' 6.5" (1.689 m)  Wt 148 lb (67.132 kg)  BMI 23.53 kg/m2  SpO2 68%  Physical Exam  Constitutional: He is oriented to person, place, and time. He appears well-developed and well-nourished. No distress.  HENT:  Head: Normocephalic and atraumatic.  Wet blood in nose on right ride not actively dripping, mild blood in post oropharyngeal.   Eyes: Conjunctivae and EOM  are normal.  Neck: Neck supple. No tracheal deviation present.  Cardiovascular: Normal rate.   Valve click  Pulmonary/Chest: Effort normal. No respiratory distress. He has wheezes.  Musculoskeletal: Normal range of motion.  Neurological: He is alert and oriented to person, place, and time.  Skin: Skin is warm and dry. There is pallor.  Slightly pale.   Psychiatric: He has a normal mood and affect. His behavior is normal.  Nursing note and vitals reviewed.   ED Course  EPISTAXIS MANAGEMENT Date/Time: 05/14/2014 10:00 PM Performed by: Davonna Belling R. Authorized by: Mackie Pai Consent: Verbal consent obtained. Written consent not obtained. Risks and benefits: risks, benefits and alternatives were discussed Consent given by: patient Required items: required blood products, implants, devices, and special equipment available Patient identity confirmed: verbally with patient Time out: Immediately prior to procedure a "time out" was called to verify the correct patient, procedure, equipment, support staff and site/side marked as required. Patient sedated: no Treatment site: right anterior Repair method: nasal balloon Post-procedure assessment: bleeding decreased Treatment complexity: simple Patient tolerance: Patient tolerated the procedure well with no immediate complications   (including critical care time) Labs Review Labs Reviewed  CBC - Abnormal; Notable for the following:    RBC 3.63 (*)    Hemoglobin 10.3 (*)    HCT 33.3 (*)    RDW 17.4 (*)    All other components within normal limits  PROTIME-INR - Abnormal; Notable for the following:    Prothrombin Time 25.4 (*)    INR 2.28 (*)    All other components within normal limits  BASIC METABOLIC PANEL - Abnormal; Notable for the following:    Sodium 133 (*)    Chloride 94 (*)    BUN 50 (*)    Creatinine, Ser 1.41 (*)    GFR calc non Af Amer 50 (*)    GFR calc Af Amer 58 (*)    All other components within  normal limits  PRO B NATRIURETIC PEPTIDE - Abnormal; Notable for the following:  Pro B Natriuretic peptide (BNP) 13471.0 (*)    All other components within normal limits  TROPONIN I    Imaging Review Dg Chest Portable 1 View  05/31/2014   CLINICAL DATA:  Cough and shortness of breath today. Epistaxis today.  EXAM: PORTABLE CHEST - 1 VIEW  COMPARISON:  02/08/2014.  FINDINGS: There are changes from cardiac surgery and valve replacement. Moderate enlargement of the cardiopericardial silhouette. No mediastinal or hilar masses.  There is vascular congestion interstitial thickening bilaterally. Mild hazy opacity at the lung bases suggests small effusions. No pneumothorax. No focal pneumonia.  IMPRESSION: 1. Findings consistent with congestive heart failure with cardiomegaly, interstitial edema and small effusions.   Electronically Signed   By: Lajean Manes M.D.   On: 06/04/2014 21:52     EKG Interpretation None      MDM   Final diagnoses:  SOB (shortness of breath)  Anterior epistaxis  Acute on chronic congestive heart failure, unspecified congestive heart failure type   Patient with epistaxis on Coumadin and Plavix. Unable to cauterize and control the bleeding. Rapid Rhino was placed.patient has had hypoxia off oxygen. He's been able to do the full nasal cannula oxygen due to blood clot and now wraparound. Chest x-ray showed CHF. History of same. Will admit to [facility] after discussion with Dr. Shanon Brow. No ENT coverage at [facility].      I personally performed the services described in this documentation, which was scribed in my presence. The recorded information has been reviewed and is accurate.      Jasper Riling. Alvino Chapel, MD 05/14/14 0002

## 2014-05-13 NOTE — H&P (Signed)
PCP:   HAWKINS,EDWARD Carlean Jews, MD   Chief Complaint:  Nose bleed  HPI: 68 yo male h/o freq nosebleeds, AVR, MVR, afib, chronic diastolic chf, chronic oxygen per [place] at home at 2 Liters, comes in with another nosebleed today and sob.  He normally has some respiratory problems when his nosebleeds.  He is on plavix and coumadin.  He called 911 and he was hypoxic at home in the 70s on RA (could not use his oxgyen  due to the nosebleed).  He has been placed on NRB and doing well with that.  His nose has rhinopack in place and the bleeding has stopped at this time.  Denies any fevers.  No swelling in his legs.  No cough.  H/o cauterization to his right nares before.  Review of Systems:  Positive and negative as per HPI otherwise all other systems are negative  Past Medical History: Past Medical History  Diagnosis Date  . Essential hypertension, benign   . Coronary atherosclerosis of native coronary artery     a. CABG/MVR/AVR-2003 (#25 St. Jude/#21 St. Jude); anticoagulation; b. negative stress nuclear-2005;  c. 01/2013 NSTEMI/Cath/PCI: LM nl, LAD 50p, 40-72m, D1 50ost, LCX 33m, RCA 50/43m (2.5x20 Promus DES), 50d, VG->Diag 100, VG->PDA 100, VG->OM 100, LIMA->LAD ok.  . Epistaxis     Requiring cautery & aterial ligation-09/2009  . GERD (gastroesophageal reflux disease)   . Hyperlipemia   . Abnormal LFTs     Possible cirrhosis  . Chronic anticoagulation   . Valvular heart disease     a. 2003: MVR/AVR-2003 (#25 St. Jude/#21 St. Jude);  b. 01/2013 Echo: EF 55%, Mech AVR mean grad 12, Mech MVR mean grad 6.  . Tobacco abuse     45 pack years  . Fasting hyperglycemia   . Nephrolithiasis   . Diverticulosis   . Hyponatremia   . Chronic diastolic CHF (congestive heart failure)   . Chronic renal disease   . Hemolytic anemia   . ETOH abuse   . Atrial fibrillation 01/16/2013    On coumadin DCCV 07/2013.   Marland Kitchen COPD (chronic obstructive pulmonary disease)    Past Surgical History  Procedure Laterality  Date  . Endocopic sphenopalatine artery ligation & cautry    . Inguinal hernia repair      Left & right  . Coronary artery bypass graft  11/2001    [facility]  . Cardiac valve replacement  11/2001    AVR and MVR-St. Jude devices  . Cataract extraction w/phaco  01/20/2011    Procedure: CATARACT EXTRACTION PHACO AND INTRAOCULAR LENS PLACEMENT (IOC);  Surgeon: Elta Guadeloupe T. Gershon Crane;  Location: [facility];  Service: Ophthalmology;  Laterality: Right;  CDE: 8.51  . Cataract extraction w/phaco  02/03/2011    Procedure: CATARACT EXTRACTION PHACO AND INTRAOCULAR LENS PLACEMENT (IOC);  Surgeon: Elta Guadeloupe T. Gershon Crane;  Location: [facility];  Service: Ophthalmology;  Laterality: Left;  CDE:10.01  . Colonoscopy  04/05/02; 08/2011    friable anal canal hemorrhoids otherwise normal; 2 diminutive polyps excised, minimal diverticulosis noted  . Esophagogastroduodenoscopy  01/2002    Dr. Laural Golden, submucosal esophageal lesion c/w leiomyoma  . Colonoscopy  09/02/2011    Procedure: COLONOSCOPY;  Surgeon: Daneil Dolin, MD;  Location: [facility];  Service: Endoscopy;  Laterality: N/A;  8:15  . Yag laser application Right 16/04/9603    Procedure: YAG LASER APPLICATION;  Surgeon: Elta Guadeloupe T. Gershon Crane, MD;  Location: [facility];  Service: Ophthalmology;  Laterality: Right;    Medications: Prior to  Admission medications   Medication Sig Start Date End Date Taking? Authorizing Provider  amLODipine (NORVASC) 10 MG tablet Take 10 mg by mouth at bedtime.  12/15/13  Yes Historical Provider, MD  atorvastatin (LIPITOR) 80 MG tablet Take 1 tablet (80 mg total) by mouth daily. 03/13/14  Yes Arnoldo Lenis, MD  clopidogrel (PLAVIX) 75 MG tablet Take 75 mg by mouth daily.   Yes Historical Provider, MD  ferrous sulfate 325 (65 FE) MG EC tablet Take 325 mg by mouth daily with breakfast.   Yes Historical Provider, MD  folic acid (FOLVITE) 1 MG tablet Take 1 tablet (1 mg total) by mouth daily. 01/22/13  Yes Brooke O Edmisten, PA-C  levalbuterol  (XOPENEX) 0.63 MG/3ML nebulizer solution Take 3 mLs (0.63 mg total) by nebulization every 6 (six) hours as needed for wheezing or shortness of breath. 01/22/13  Yes Brooke O Edmisten, PA-C  loperamide (IMODIUM) 2 MG capsule Take 2 capsules (4 mg total) by mouth every 12 (twelve) hours as needed for diarrhea or loose stools. 12/06/13  Yes Alonza Bogus, MD  magnesium oxide (MAG-OX) 400 MG tablet Take 400 mg by mouth daily.   Yes Historical Provider, MD  metolazone (ZAROXOLYN) 2.5 MG tablet Take 2.5 mg by mouth as needed (Fluid).   Yes Historical Provider, MD  metoprolol (LOPRESSOR) 100 MG tablet Take 100 mg by mouth 2 (two) times daily.   Yes Historical Provider, MD  Multiple Vitamin (MULTIVITAMIN WITH MINERALS) TABS Take 1 tablet by mouth daily. 01/22/13  Yes Brooke O Edmisten, PA-C  pantoprazole (PROTONIX) 40 MG tablet Take 40 mg by mouth daily.   Yes Historical Provider, MD  potassium chloride SA (K-DUR,KLOR-CON) 20 MEQ tablet Take 1 tablet (20 mEq total) by mouth daily. 12/18/13  Yes Arnoldo Lenis, MD  thiamine 100 MG tablet Take 1 tablet (100 mg total) by mouth daily. 01/22/13  Yes Brooke O Edmisten, PA-C  torsemide (DEMADEX) 20 MG tablet Take 3 tablets (60 mg total) by mouth 2 (two) times daily. 03/13/14  Yes Arnoldo Lenis, MD  warfarin (COUMADIN) 5 MG tablet Take 2.5-5 mg by mouth daily. Takes 5 mg on Mon, Wed, Fri and takes 2.5 mg all other days.   Yes Historical Provider, MD  acetaminophen (TYLENOL) 500 MG tablet Take 1,000 mg by mouth daily as needed for mild pain or headache.    Historical Provider, MD  clopidogrel (PLAVIX) 75 MG tablet TAKE ONE TABLET BY MOUTH DAILY WITH BREAKFAST Patient not taking: Reported on 05/22/2014 03/01/14   Arnoldo Lenis, MD  magnesium oxide (MAG-OX) 400 (241.3 MG) MG tablet TAKE ONE TABLET BY MOUTH ONCE DAILY Patient not taking: Reported on 05/10/2014 05/08/14   Arnoldo Lenis, MD  metolazone (ZAROXOLYN) 2.5 MG tablet Take 2.5 mg (1 tablet) every week as  needed for swelling. Patient not taking: Reported on 05/17/2014 01/26/14   Arnoldo Lenis, MD  metoprolol (LOPRESSOR) 100 MG tablet TAKE ONE TABLET BY MOUTH TWICE DAILY Patient not taking: Reported on 05/09/2014 03/01/14   Arnoldo Lenis, MD  nitroGLYCERIN (NITROSTAT) 0.4 MG SL tablet Place 1 tablet (0.4 mg total) under the tongue every 5 (five) minutes x 3 doses as needed for chest pain. 01/31/13   Rogelia Mire, NP  warfarin (COUMADIN) 5 MG tablet Take 1-1.5 tablets (5-7.5 mg total) by mouth daily. Takes 7.5mg  on Mondays & 5mg  all other days of week Patient not taking: Reported on 06/03/2014 05/08/14   Arnoldo Lenis, MD  Allergies:  No Known Allergies  Social History:  reports that he quit smoking about 4 months ago. His smoking use included Cigarettes. He has a 13 pack-year smoking history. He quit smokeless tobacco use about 5 months ago. He reports that he drinks about 4.2 oz of alcohol per week. He reports that he does not use illicit drugs.  Family History: Family History  Problem Relation Age of Onset  . Hypotension Neg Hx   . Anesthesia problems Neg Hx   . Malignant hyperthermia Neg Hx   . Pseudochol deficiency Neg Hx   . Colon cancer Neg Hx   . Liver disease Neg Hx     Physical Exam: Filed Vitals:   05/30/2014 2211 05/11/2014 2230 05/16/2014 2300 05/17/2014 2330  BP: 128/74 106/62 114/86 126/67  Pulse: 80  58 78  Resp: 22 21    Height:      Weight:      SpO2: 91%  100% 100%   General appearance: alert, cooperative and mild distress Head: Normocephalic, without obvious abnormality, atraumatic Eyes: negative Nose: Nares normal. Septum midline. Mucosa normal. No drainage or sinus tenderness. rt nares packed, no bleeding noted Neck: no JVD and supple, symmetrical, trachea midline Lungs: clear to auscultation bilaterally Heart: regular rate and rhythm, S1, S2 normal, no murmur, click, rub or gallop Abdomen: soft, non-tender; bowel sounds normal; no masses,  no  organomegaly Extremities: extremities normal, atraumatic, no cyanosis or edema Pulses: 2+ and symmetric Skin: Skin color, texture, turgor normal. No rashes or lesions Neurologic: Grossly normal    Labs on Admission:   Recent Labs  05/25/2014 2201  NA 133*  K 4.7  CL 94*  CO2 29  GLUCOSE 92  BUN 50*  CREATININE 1.41*  CALCIUM 8.6    Recent Labs  06/03/2014 2201  WBC 6.8  HGB 10.3*  HCT 33.3*  MCV 91.7  PLT 231    Recent Labs  05/19/2014 2157  TROPONINI <0.30   Radiological Exams on Admission: Dg Chest Portable 1 View  05/28/2014   CLINICAL DATA:  Cough and shortness of breath today. Epistaxis today.  EXAM: PORTABLE CHEST - 1 VIEW  COMPARISON:  02/08/2014.  FINDINGS: There are changes from cardiac surgery and valve replacement. Moderate enlargement of the cardiopericardial silhouette. No mediastinal or hilar masses.  There is vascular congestion interstitial thickening bilaterally. Mild hazy opacity at the lung bases suggests small effusions. No pneumothorax. No focal pneumonia.  IMPRESSION: 1. Findings consistent with congestive heart failure with cardiomegaly, interstitial edema and small effusions.   Electronically Signed   By: Lajean Manes M.D.   On: 05/10/2014 21:52    Assessment/Plan  68 yo male with recurrent epistaxis and acute on chronic respiratory failure hypoxia  Principal Problem:   Epistaxis, recurrent-  Seems to have stopped at this point.  Will transfer to [place], in case it rebleeds again and need for ENT, no ENT available here at [facility].  Will not reverse his coagulation at this time due to his significant h/o below unless profusely starts bleeding again.  hgb stable.  Has packing to right nares seems to be working at this time.  Active Problems:  Stable unless o/w noted   HYPONATREMIA, CHRONIC   COPD (chronic obstructive pulmonary disease)  stable   MITRAL VALVE REPLACEMENT, HX OF   S/P AVR   Chronic anticoagulation   Acute respiratory  failure with hypoxia- worsened due to the nosebleed, better now on NRB.   CHF (congestive heart failure)  stable  Admit to [place].  Full code.    Yuvin Bussiere A 06/01/2014, 11:42 PM

## 2014-05-13 NOTE — ED Notes (Signed)
Pt here for nosebleed, pt states bleeding for an hour, pt states on plavix and coumadin , pt states on home O2 @L /M

## 2014-05-14 ENCOUNTER — Encounter (HOSPITAL_COMMUNITY): Payer: Self-pay

## 2014-05-14 DIAGNOSIS — E440 Moderate protein-calorie malnutrition: Secondary | ICD-10-CM | POA: Insufficient documentation

## 2014-05-14 DIAGNOSIS — N183 Chronic kidney disease, stage 3 unspecified: Secondary | ICD-10-CM | POA: Diagnosis present

## 2014-05-14 DIAGNOSIS — J9621 Acute and chronic respiratory failure with hypoxia: Secondary | ICD-10-CM

## 2014-05-14 LAB — CBC
HCT: 34.5 % — ABNORMAL LOW (ref 39.0–52.0)
Hemoglobin: 10.5 g/dL — ABNORMAL LOW (ref 13.0–17.0)
MCH: 28.2 pg (ref 26.0–34.0)
MCHC: 30.4 g/dL (ref 30.0–36.0)
MCV: 92.5 fL (ref 78.0–100.0)
PLATELETS: 258 10*3/uL (ref 150–400)
RBC: 3.73 MIL/uL — AB (ref 4.22–5.81)
RDW: 17.3 % — ABNORMAL HIGH (ref 11.5–15.5)
WBC: 6.6 10*3/uL (ref 4.0–10.5)

## 2014-05-14 LAB — BASIC METABOLIC PANEL
Anion gap: 12 (ref 5–15)
BUN: 49 mg/dL — ABNORMAL HIGH (ref 6–23)
CHLORIDE: 92 meq/L — AB (ref 96–112)
CO2: 29 mEq/L (ref 19–32)
Calcium: 8.8 mg/dL (ref 8.4–10.5)
Creatinine, Ser: 1.37 mg/dL — ABNORMAL HIGH (ref 0.50–1.35)
GFR calc non Af Amer: 51 mL/min — ABNORMAL LOW (ref >90)
GFR, EST AFRICAN AMERICAN: 60 mL/min — AB (ref >90)
Glucose, Bld: 83 mg/dL (ref 70–99)
POTASSIUM: 4.7 meq/L (ref 3.7–5.3)
SODIUM: 133 meq/L — AB (ref 137–147)

## 2014-05-14 LAB — PROTIME-INR
INR: 2.17 — AB (ref 0.00–1.49)
Prothrombin Time: 24.4 seconds — ABNORMAL HIGH (ref 11.6–15.2)

## 2014-05-14 MED ORDER — METOLAZONE 2.5 MG PO TABS
2.5000 mg | ORAL_TABLET | ORAL | Status: DC | PRN
  Filled 2014-05-14: qty 1

## 2014-05-14 MED ORDER — LIDOCAINE HCL 4 % EX SOLN
Freq: Once | CUTANEOUS | Status: DC
  Filled 2014-05-14: qty 50

## 2014-05-14 MED ORDER — FOLIC ACID 1 MG PO TABS
1.0000 mg | ORAL_TABLET | Freq: Every day | ORAL | Status: DC
  Administered 2014-05-14: 1 mg via ORAL
  Filled 2014-05-14 (×2): qty 1

## 2014-05-14 MED ORDER — SODIUM CHLORIDE 0.9 % IJ SOLN
3.0000 mL | Freq: Two times a day (BID) | INTRAMUSCULAR | Status: DC
  Administered 2014-05-14 – 2014-05-25 (×20): 3 mL via INTRAVENOUS

## 2014-05-14 MED ORDER — METOPROLOL TARTRATE 50 MG PO TABS
100.0000 mg | ORAL_TABLET | Freq: Two times a day (BID) | ORAL | Status: DC
  Administered 2014-05-14 (×2): 100 mg via ORAL
  Filled 2014-05-14 (×4): qty 2

## 2014-05-14 MED ORDER — AMLODIPINE BESYLATE 10 MG PO TABS
10.0000 mg | ORAL_TABLET | Freq: Every day | ORAL | Status: DC
  Administered 2014-05-14: 10 mg via ORAL
  Filled 2014-05-14 (×2): qty 1

## 2014-05-14 MED ORDER — ADULT MULTIVITAMIN W/MINERALS CH
1.0000 | ORAL_TABLET | Freq: Every day | ORAL | Status: DC
  Administered 2014-05-14: 1 via ORAL
  Filled 2014-05-14 (×2): qty 1

## 2014-05-14 MED ORDER — SODIUM CHLORIDE 0.9 % IV SOLN
250.0000 mL | INTRAVENOUS | Status: DC | PRN
  Administered 2014-05-18 – 2014-05-24 (×2): 250 mL via INTRAVENOUS

## 2014-05-14 MED ORDER — ATORVASTATIN CALCIUM 40 MG PO TABS
80.0000 mg | ORAL_TABLET | Freq: Every day | ORAL | Status: DC
  Administered 2014-05-14: 80 mg via ORAL
  Filled 2014-05-14 (×2): qty 1

## 2014-05-14 MED ORDER — OXYMETAZOLINE HCL 0.05 % NA SOLN
1.0000 | Freq: Once | NASAL | Status: DC
  Filled 2014-05-14: qty 15

## 2014-05-14 MED ORDER — VITAMIN B-1 100 MG PO TABS
100.0000 mg | ORAL_TABLET | Freq: Every day | ORAL | Status: DC
  Administered 2014-05-14: 100 mg via ORAL
  Filled 2014-05-14 (×2): qty 1

## 2014-05-14 MED ORDER — TORSEMIDE 20 MG PO TABS
60.0000 mg | ORAL_TABLET | Freq: Two times a day (BID) | ORAL | Status: DC
  Administered 2014-05-14 (×2): 60 mg via ORAL
  Filled 2014-05-14 (×5): qty 3

## 2014-05-14 MED ORDER — LEVALBUTEROL HCL 0.63 MG/3ML IN NEBU
0.6300 mg | INHALATION_SOLUTION | Freq: Four times a day (QID) | RESPIRATORY_TRACT | Status: DC | PRN
  Administered 2014-05-15: 0.63 mg via RESPIRATORY_TRACT
  Filled 2014-05-14: qty 3

## 2014-05-14 MED ORDER — ONDANSETRON HCL 4 MG PO TABS: 4.0000 mg | ORAL_TABLET | Freq: Four times a day (QID) | ORAL | Status: DC | PRN

## 2014-05-14 MED ORDER — SODIUM CHLORIDE 0.9 % IJ SOLN: 3.0000 mL | INTRAMUSCULAR | Status: DC | PRN

## 2014-05-14 MED ORDER — PANTOPRAZOLE SODIUM 40 MG PO TBEC
40.0000 mg | DELAYED_RELEASE_TABLET | Freq: Every day | ORAL | Status: DC
  Administered 2014-05-14: 40 mg via ORAL
  Filled 2014-05-14: qty 1

## 2014-05-14 MED ORDER — ENSURE COMPLETE PO LIQD: 237.0000 mL | Freq: Two times a day (BID) | ORAL | Status: DC

## 2014-05-14 MED ORDER — ONDANSETRON HCL 4 MG/2ML IJ SOLN: 4.0000 mg | Freq: Four times a day (QID) | INTRAMUSCULAR | Status: DC | PRN

## 2014-05-14 NOTE — Consult Note (Signed)
[name], [name] 68 y.o., male [id number]     Chief Complaint: epistaxis  HPI: 68 yo wm, 2 mechanical heart valves.  Underwent angioplasty with stent placement sometime this year.  On Coumadin AND Plavix.  Onset bleeding RIGHT nostril last evening.  Seen at [facility] ER, balloon pack placed RIGHT nose with incomplete control.  Sent to [facility] and admitted for ENT consultation. INR 2.1 per nurses.    Underwent control of epistaxis by Dr. Wilburn Cornelia several years ago.  These records are not immediately available.  Pt claims to have had some epistaxis problems since childhood.  Uses nasal cannula O2 at home with NO humidification.  PMH: Past Medical History  Diagnosis Date  . Essential hypertension, benign   . Coronary atherosclerosis of native coronary artery     a. CABG/MVR/AVR-2003 (#25 St. Jude/#21 St. Jude); anticoagulation; b. negative stress nuclear-2005;  c. 01/2013 NSTEMI/Cath/PCI: LM nl, LAD 50p, 40-74m D1 50ost, LCX 325mRCA 50/9926m.5x20 Promus DES), 50d, VG->Diag 100, VG->PDA 100, VG->OM 100, LIMA->LAD ok.  . Epistaxis     Requiring cautery & aterial ligation-09/2009  . GERD (gastroesophageal reflux disease)   . Hyperlipemia   . Abnormal LFTs     Possible cirrhosis  . Chronic anticoagulation   . Valvular heart disease     a. 2003: MVR/AVR-2003 (#25 St. Jude/#21 St. Jude);  b. 01/2013 Echo: EF 55%, Mech AVR mean grad 12, Mech MVR mean grad 6.  . Tobacco abuse     45 pack years  . Fasting hyperglycemia   . Nephrolithiasis   . Diverticulosis   . Hyponatremia   . Chronic diastolic CHF (congestive heart failure)   . Chronic renal disease   . Hemolytic anemia   . ETOH abuse   . Atrial fibrillation 01/16/2013    On coumadin DCCV 07/2013.   . CMarland KitchenPD (chronic obstructive pulmonary disease)   . Stage III chronic kidney disease     Surg Hx: Past Surgical History  Procedure Laterality Date  . Endocopic sphenopalatine artery ligation & cautry    . Inguinal hernia repair      Left & right   . Coronary artery [facility]  11[facility]2001    [facility]  [facility]    AVR and MVR-St. Jude devices  . Cataract extraction w/phaco  01/20/2011    Procedure: CATARACT EXTRACTION PHACO AND INTRAOCULAR LENS PLACEMENT (IOC);  Surgeon: MarElta Guadeloupe ShaGershon CraneLocation: [facility] ORS;  Service: Ophthalmology;  Laterality: Right;  CDE: 8.51  . Cataract extraction w/phaco  02/03/2011    Procedure: CATARACT EXTRACTION PHACO AND INTRAOCULAR LENS PLACEMENT (IOC);  Surgeon: MarElta Guadeloupe ShaGershon CraneLocation: [facility] ORS;  Service: Ophthalmology;  Laterality: Left;  CDE:10.01  . Colonoscopy  04/05/02; 08/2011    friable anal canal hemorrhoids otherwise normal; 2 diminutive polyps excised, minimal diverticulosis noted  . Esophagogastroduodenoscopy  01/2002    Dr. RehLaural Goldenubmucosal esophageal lesion c/w leiomyoma  . Colonoscopy  09/02/2011    Procedure: COLONOSCOPY;  Surgeon: RobDaneil DolinD;  Location: [facility];  Service: Endoscopy;  Laterality: N/A;  8:15  . Yag laser application Right 06/14/92/2355 Procedure: YAG LASER APPLICATION;  Surgeon: MarElta Guadeloupe ShaGershon CraneD;  Location: [facility] ORS;  Service: Ophthalmology;  Laterality: Right;    FHx:   Family History  Problem Relation Age of Onset  . Hypotension Neg Hx   . Anesthesia problems Neg Hx   . Malignant hyperthermia Neg Hx   . Pseudochol  deficiency Neg Hx   . Colon cancer Neg Hx   . Liver disease Neg Hx    SocHx:  reports that he quit smoking about 4 months ago. His smoking use included Cigarettes. He has a 13 pack-year smoking history. He quit smokeless tobacco use about 5 months ago. He reports that he drinks about 4.2 oz of alcohol per week. He reports that he does not use illicit drugs.  ALLERGIES: No Known Allergies  Medications Prior to Admission  Medication Sig Dispense Refill  . amLODipine (NORVASC) 10 MG tablet Take 10 mg by mouth at bedtime.     Marland Kitchen atorvastatin (LIPITOR) 80 MG tablet Take 1 tablet (80 mg total) by mouth daily. 90  tablet 3  . clopidogrel (PLAVIX) 75 MG tablet Take 75 mg by mouth daily.    . ferrous sulfate 325 (65 FE) MG EC tablet Take 325 mg by mouth daily with breakfast.    . folic acid (FOLVITE) 1 MG tablet Take 1 tablet (1 mg total) by mouth daily. 30 tablet 4  . levalbuterol (XOPENEX) 0.63 MG/3ML nebulizer solution Take 3 mLs (0.63 mg total) by nebulization every 6 (six) hours as needed for wheezing or shortness of breath. 3 mL 4  . loperamide (IMODIUM) 2 MG capsule Take 2 capsules (4 mg total) by mouth every 12 (twelve) hours as needed for diarrhea or loose stools. 30 capsule 0  . magnesium oxide (MAG-OX) 400 MG tablet Take 400 mg by mouth daily.    . metolazone (ZAROXOLYN) 2.5 MG tablet Take 2.5 mg by mouth as needed (Fluid).    . metoprolol (LOPRESSOR) 100 MG tablet Take 100 mg by mouth 2 (two) times daily.    . Multiple Vitamin (MULTIVITAMIN WITH MINERALS) TABS Take 1 tablet by mouth daily.    . pantoprazole (PROTONIX) 40 MG tablet Take 40 mg by mouth daily.    . potassium chloride SA (K-DUR,KLOR-CON) 20 MEQ tablet Take 1 tablet (20 mEq total) by mouth daily. 90 tablet 3  . thiamine 100 MG tablet Take 1 tablet (100 mg total) by mouth daily. 30 tablet 4  . torsemide (DEMADEX) 20 MG tablet Take 3 tablets (60 mg total) by mouth 2 (two) times daily. 180 tablet 6  . warfarin (COUMADIN) 5 MG tablet Take 2.5-5 mg by mouth daily. Takes 5 mg on Mon, Wed, Fri and takes 2.5 mg all other days.    Marland Kitchen acetaminophen (TYLENOL) 500 MG tablet Take 1,000 mg by mouth daily as needed for mild pain or headache.    . clopidogrel (PLAVIX) 75 MG tablet TAKE ONE TABLET BY MOUTH DAILY WITH BREAKFAST (Patient not taking: Reported on 05/29/2014) 30 tablet 3  . magnesium oxide (MAG-OX) 400 (241.3 MG) MG tablet TAKE ONE TABLET BY MOUTH ONCE DAILY (Patient not taking: Reported on 05/31/2014) 30 tablet 6  . metolazone (ZAROXOLYN) 2.5 MG tablet Take 2.5 mg (1 tablet) every week as needed for swelling. (Patient not taking: Reported on  05/11/2014) 12 tablet 3  . metoprolol (LOPRESSOR) 100 MG tablet TAKE ONE TABLET BY MOUTH TWICE DAILY (Patient not taking: Reported on 05/24/2014) 60 tablet 11  . nitroGLYCERIN (NITROSTAT) 0.4 MG SL tablet Place 1 tablet (0.4 mg total) under the tongue every 5 (five) minutes x 3 doses as needed for chest pain. 25 tablet 3  . warfarin (COUMADIN) 5 MG tablet Take 1-1.5 tablets (5-7.5 mg total) by mouth daily. Takes 7.39m on Mondays & 524mall other days of week (Patient not taking: Reported on 05/14/2014)  45 tablet 3    Results for orders placed or performed during the hospital encounter of 05/24/2014 (from the past 48 hour(s))  Pro b natriuretic peptide     Status: Abnormal   Collection Time: 05/09/2014  9:57 PM  Result Value Ref Range   Pro B Natriuretic peptide (BNP) 13471.0 (H) 0 - 125 pg/mL  Troponin I     Status: None   Collection Time: 05/20/2014  9:57 PM  Result Value Ref Range   Troponin I <0.30 <0.30 ng/mL    Comment:        Due to the release kinetics of cTnI, a negative result within the first hours of the onset of symptoms does not rule out myocardial infarction with certainty. If myocardial infarction is still suspected, repeat the test at appropriate intervals.   CBC     Status: Abnormal   Collection Time: 05/19/2014 10:01 PM  Result Value Ref Range   WBC 6.8 4.0 - 10.5 K/uL   RBC 3.63 (L) 4.22 - 5.81 MIL/uL   Hemoglobin 10.3 (L) 13.0 - 17.0 g/dL   HCT 33.3 (L) 39.0 - 52.0 %   MCV 91.7 78.0 - 100.0 fL   MCH 28.4 26.0 - 34.0 pg   MCHC 30.9 30.0 - 36.0 g/dL   RDW 17.4 (H) 11.5 - 15.5 %   Platelets 231 150 - 400 K/uL  Protime-INR     Status: Abnormal   Collection Time: 05/18/2014 10:01 PM  Result Value Ref Range   Prothrombin Time 25.4 (H) 11.6 - 15.2 seconds   INR 2.28 (H) 0.00 - 6.31  Basic metabolic panel     Status: Abnormal   Collection Time: 05/17/2014 10:01 PM  Result Value Ref Range   Sodium 133 (L) 137 - 147 mEq/L   Potassium 4.7 3.7 - 5.3 mEq/L   Chloride 94 (L) 96 -  112 mEq/L   CO2 29 19 - 32 mEq/L   Glucose, Bld 92 70 - 99 mg/dL   BUN 50 (H) 6 - 23 mg/dL   Creatinine, Ser 1.41 (H) 0.50 - 1.35 mg/dL   Calcium 8.6 8.4 - 10.5 mg/dL   GFR calc non Af Amer 50 (L) >90 mL/min   GFR calc Af Amer 58 (L) >90 mL/min    Comment: (NOTE) The eGFR has been calculated using the CKD EPI equation. This calculation has not been validated in all clinical situations. eGFR's persistently <90 mL/min signify possible Chronic Kidney Disease.    Anion gap 10 5 - 15  Basic metabolic panel     Status: Abnormal   Collection Time: 05/14/14  3:58 AM  Result Value Ref Range   Sodium 133 (L) 137 - 147 mEq/L   Potassium 4.7 3.7 - 5.3 mEq/L   Chloride 92 (L) 96 - 112 mEq/L   CO2 29 19 - 32 mEq/L   Glucose, Bld 83 70 - 99 mg/dL   BUN 49 (H) 6 - 23 mg/dL   Creatinine, Ser 1.37 (H) 0.50 - 1.35 mg/dL   Calcium 8.8 8.4 - 10.5 mg/dL   GFR calc non Af Amer 51 (L) >90 mL/min   GFR calc Af Amer 60 (L) >90 mL/min    Comment: (NOTE) The eGFR has been calculated using the CKD EPI equation. This calculation has not been validated in all clinical situations. eGFR's persistently <90 mL/min signify possible Chronic Kidney Disease.    Anion gap 12 5 - 15  CBC     Status: Abnormal   Collection Time:  05/14/14  3:58 AM  Result Value Ref Range   WBC 6.6 4.0 - 10.5 K/uL   RBC 3.73 (L) 4.22 - 5.81 MIL/uL   Hemoglobin 10.5 (L) 13.0 - 17.0 g/dL   HCT 34.5 (L) 39.0 - 52.0 %   MCV 92.5 78.0 - 100.0 fL   MCH 28.2 26.0 - 34.0 pg   MCHC 30.4 30.0 - 36.0 g/dL   RDW 17.3 (H) 11.5 - 15.5 %   Platelets 258 150 - 400 K/uL  Protime-INR     Status: Abnormal   Collection Time: 05/14/14  3:58 AM  Result Value Ref Range   Prothrombin Time 24.4 (H) 11.6 - 15.2 seconds   INR 2.17 (H) 0.00 - 1.49   Dg Chest Portable 1 View  05/08/2014   CLINICAL DATA:  Cough and shortness of breath today. Epistaxis today.  EXAM: PORTABLE CHEST - 1 VIEW  COMPARISON:  02/08/2014.  FINDINGS: There are changes from  cardiac surgery and valve replacement. Moderate enlargement of the cardiopericardial silhouette. No mediastinal or hilar masses.  There is vascular congestion interstitial thickening bilaterally. Mild hazy opacity at the lung bases suggests small effusions. No pneumothorax. No focal pneumonia.  IMPRESSION: 1. Findings consistent with congestive heart failure with cardiomegaly, interstitial edema and small effusions.   Electronically Signed   By: Lajean Manes M.D.   On: 05/07/2014 21:52     Blood pressure 105/65, pulse 61, temperature 98 F (36.7 C), temperature source Oral, resp. rate 20, height 5' 6" (1.676 m), weight 71.305 kg (157 lb 3.2 oz), SpO2 94 %.  PHYSICAL EXAM: Overall appearance:  Older than stated age.  Very belligerant and mobile with manipulations. Head:NCAT Ears:  Wax AU, partially cleared  TM clear AU Nose:  Balloon pack RIGHT.  Narrow nares, with LEFTward septal deviation.  Upon removal of pack, diffuse RIGHT anterior septal bleeding c/w excoriation from packing and coagulopathy Oral Cavity:  Old blood Oral Pharynx/Hypopharynx/Larynx  Old blood Neuro:  Not examined Neck:  Grossly nl   Assessment/Plan Coagulopathic RIGHT anterior epistaxis.  Risk factors include Coumadin, Plavix, Non-humidified nasal cannula O2.  Not sure why he is on two separate anti-coagulants, but will leave this to Cardiology.  Nasal cannula O2 should categorically never be used without humidification, especially in the situation of therapeutic anticoagulation.  With fairly poor pt cooperation, removed the balloon pack and anesthetized the ant septum on both sides with Afrin/4%xylocaine mix on cotton pledgets.  Persistent oozing from multiple sites on the RIGHT ant septum after removal of pack.  Ant. RIGHT nose packed with fibrillar Surgicel dissolvable packing material with apparent cessation of bleeding.    O2 mask used as much as possible during procedure.    Needs to be on low coverage  antibiotics while nasal packing in place, Keflex or similar.    Ordered nasal hygiene measures.    If further bleeding, will need to have Cardiology adjust anticoagulation levels.    Waited approximately 45 minutes to have inappropriate, then  broken instruments delivered consistent with ENT procedures at [facility], although all instruments and supplies were pre-ordered 2 hrs in advance.     Jodi Marble 26/09/3352, 6:01 PM

## 2014-05-14 NOTE — Progress Notes (Signed)
INITIAL NUTRITION ASSESSMENT  DOCUMENTATION CODES Per approved criteria  -Moderate malnutrition in the context of chronic illness  Pt meets criteria for Moderate MALNUTRITION in the context of chronic illness as evidenced by moderate fat and severe muscle depletion.  INTERVENTION: Provide Ensure Complete po BID, each supplement provides 350 kcal and 13 grams of protein Encourage PO intake RD to continue to monitor  NUTRITION DIAGNOSIS: Inadequate oral intake related to decreased appetite as evidenced by pt report and signs of muscle wasting.   Goal: Pt to meet >/= 90% of their estimated nutrition needs   Monitor:  PO and supplemental intake, weight, labs, I/O's  Reason for Assessment: Pt identified as at nutrition risk on the Malnutrition Screen Tool  Admitting Dx: Epistaxis, recurrent  ASSESSMENT: 68 y.o. male with a PMH of chronic respiratory failure on home oxygen,MVR/AVR replacement on chronic coumadin therapy who was admitted 05/06/2014 with epistaxis and dyspnea with hypoxia.   Pt distracted during visit d/t epistaxis. Pt just finished lunch tray prior to visit. Pt ate most of tray. Pt states appetite is improved.  Pt reports no weight loss. UBW of 145-150lb. Pt with CHF and CKD, weight fluctuations may be d/t fluid. Pt states he tries to watch his salt intake at home. PTA he was not interested in eating.   Dietary Recall: B: normally skips L: maybe a sandwich D: another sandwich  RD suspects poor diet quality given history of ETOH abuse.  Nutrition Focused Physical Exam:  Subcutaneous Fat:  Orbital Region: WNL Upper Arm Region: moderate depletion Thoracic and Lumbar Region: NA  Muscle:  Temple Region: mild depletion Clavicle Bone Region: severe depletion Clavicle and Acromion Bone Region: severe depletion Scapular Bone Region: severe depletion Dorsal Hand: WNL Patellar Region: mild depletion Anterior Thigh Region: mild depletion Posterior Calf Region: mild  depletion  Edema: no LE edema  Pt would like to receive Ensure supplements during stay. RD to order BID.  Labs reviewed: Low Na Elevated BUN & Creatinine  Height: Ht Readings from Last 1 Encounters:  05/14/14 5\' 6"  (1.676 m)    Weight: Wt Readings from Last 1 Encounters:  05/14/14 157 lb 3.2 oz (71.305 kg)    Ideal Body Weight: 142 lb  % Ideal Body Weight: 111%  Wt Readings from Last 10 Encounters:  05/14/14 157 lb 3.2 oz (71.305 kg)  03/13/14 154 lb (69.854 kg)  02/10/14 148 lb 13 oz (67.5 kg)  02/04/14 158 lb (71.668 kg)  02/02/14 158 lb (71.668 kg)  01/30/14 159 lb (72.122 kg)  01/26/14 160 lb (72.576 kg)  12/06/13 146 lb 12.8 oz (66.588 kg)  10/30/13 162 lb (73.483 kg)  10/23/13 160 lb (72.576 kg)    Usual Body Weight: 145-150 lb -per pt  % Usual Body Weight: 108%  BMI:  Body mass index is 25.38 kg/(m^2).  Estimated Nutritional Needs: Kcal: 1800-2000 Protein: 85-95g Fluid: 1.8L/day  Skin: ecchymosis  Diet Order: Diet Heart  EDUCATION NEEDS: -Education not appropriate at this time   Intake/Output Summary (Last 24 hours) at 05/14/14 1248 Last data filed at 05/14/14 0800  Gross per 24 hour  Intake      0 ml  Output   1200 ml  Net  -1200 ml    Last BM: 11/8  Labs:   Recent Labs Lab 05/28/2014 2201 05/14/14 0358  NA 133* 133*  K 4.7 4.7  CL 94* 92*  CO2 29 29  BUN 50* 49*  CREATININE 1.41* 1.37*  CALCIUM 8.6 8.8  GLUCOSE 92  83    CBG (last 3)  No results for input(s): GLUCAP in the last 72 hours.  Scheduled Meds: . amLODipine  10 mg Oral QHS  . atorvastatin  80 mg Oral Daily  . folic acid  1 mg Oral Daily  . metoprolol  100 mg Oral BID  . multivitamin with minerals  1 tablet Oral Daily  . pantoprazole  40 mg Oral Daily  . sodium chloride  3 mL Intravenous Q12H  . thiamine  100 mg Oral Daily  . torsemide  60 mg Oral BID    Continuous Infusions:   Past Medical History  Diagnosis Date  . Essential hypertension, benign    . Coronary atherosclerosis of native coronary artery     a. CABG/MVR/AVR-2003 (#25 St. Jude/#21 St. Jude); anticoagulation; b. negative stress nuclear-2005;  c. 01/2013 NSTEMI/Cath/PCI: LM nl, LAD 50p, 40-65m, D1 50ost, LCX 66m, RCA 50/74m (2.5x20 Promus DES), 50d, VG->Diag 100, VG->PDA 100, VG->OM 100, LIMA->LAD ok.  . Epistaxis     Requiring cautery & aterial ligation-09/2009  . GERD (gastroesophageal reflux disease)   . Hyperlipemia   . Abnormal LFTs     Possible cirrhosis  . Chronic anticoagulation   . Valvular heart disease     a. 2003: MVR/AVR-2003 (#25 St. Jude/#21 St. Jude);  b. 01/2013 Echo: EF 55%, Mech AVR mean grad 12, Mech MVR mean grad 6.  . Tobacco abuse     45 pack years  . Fasting hyperglycemia   . Nephrolithiasis   . Diverticulosis   . Hyponatremia   . Chronic diastolic CHF (congestive heart failure)   . Chronic renal disease   . Hemolytic anemia   . ETOH abuse   . Atrial fibrillation 01/16/2013    On coumadin DCCV 07/2013.   Marland Kitchen COPD (chronic obstructive pulmonary disease)   . Stage III chronic kidney disease     Past Surgical History  Procedure Laterality Date  . Endocopic sphenopalatine artery ligation & cautry    . Inguinal hernia repair      Left & right  . Coronary artery bypass graft  11/2001    [facility]  . Cardiac valve replacement  11/2001    AVR and MVR-St. Jude devices  . Cataract extraction w/phaco  01/20/2011    Procedure: CATARACT EXTRACTION PHACO AND INTRAOCULAR LENS PLACEMENT (IOC);  Surgeon: Elta Guadeloupe T. Gershon Crane;  Location: [facility] ORS;  Service: Ophthalmology;  Laterality: Right;  CDE: 8.51  . Cataract extraction w/phaco  02/03/2011    Procedure: CATARACT EXTRACTION PHACO AND INTRAOCULAR LENS PLACEMENT (IOC);  Surgeon: Elta Guadeloupe T. Gershon Crane;  Location: [facility];  Service: Ophthalmology;  Laterality: Left;  CDE:10.01  . Colonoscopy  04/05/02; 08/2011    friable anal canal hemorrhoids otherwise normal; 2 diminutive polyps excised, minimal diverticulosis noted   . Esophagogastroduodenoscopy  01/2002    Dr. Laural Golden, submucosal esophageal lesion c/w leiomyoma  . Colonoscopy  09/02/2011    Procedure: COLONOSCOPY;  Surgeon: Daneil Dolin, MD;  Location: [facility];  Service: Endoscopy;  Laterality: N/A;  8:15  . Yag laser application Right 95/03/3266    Procedure: YAG LASER APPLICATION;  Surgeon: Elta Guadeloupe T. Gershon Crane, MD;  Location: [facility] ORS;  Service: Ophthalmology;  Laterality: Right;    Clayton Bibles, MS, RD, LDN Pager: [phone] After Hours Pager: [phone]

## 2014-05-14 NOTE — Plan of Care (Signed)
Problem: Phase I Progression Outcomes Goal: Voiding-avoid urinary catheter unless indicated Outcome: Completed/Met Date Met:  05/14/14     

## 2014-05-14 NOTE — Progress Notes (Signed)
CARE MANAGEMENT NOTE 05/14/2014  Patient:  [name], [name]   Account Number:  [id number]  Date Initiated:  05/14/2014  Documentation initiated by:  Karl Bales  Subjective/Objective Assessment:   admitted 05/10/2014 with epistaxis and dyspnea with hypoxia (patient unable to use his nasal cannula oxygen at home secondary to epistaxis). Nasal packing was instituted in the ED.     Action/Plan:   FROM HOME   Anticipated DC Date:  05/16/2014   Anticipated DC Plan:  [place]  CM consult      Choice offered to / List presented to:             Status of service:  In process, will continue to follow Medicare Important Message given?   (If response is "NO", the following Medicare IM given date fields will be blank) Date Medicare IM given:   Medicare IM given by:   Date Additional Medicare IM given:   Additional Medicare IM given by:    Discharge Disposition:    Per UR Regulation:  Reviewed for med. necessity/level of care/duration of stay  If discussed at [place] of Stay Meetings, dates discussed:    Comments:  05/14/14 MMcgibboney, RN, BSN Chart reviewed.

## 2014-05-14 NOTE — Progress Notes (Addendum)
Progress Note   [name] XLK:[id number] DOB: 03/09/1946 DOA: 05/30/2014 PCP: Alonza Bogus, MD   Brief Narrative:   [name] is an 68 y.o. 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 Nasal packing was instituted in the ED.  Assessment/Plan:   Principal Problem:   Epistaxis, recurrent  Patient transferred from [facility] for ENT consultation.  Dr. Erik Obey to see the patient in consultation.  Nasal packing in place right nare.  Still with some nasal bleeding, but anti-coagulation not reversed.  Hemoglobin stable so would not reverse coumadin, but rather continue to hold it along with Plavix.  Active Problems:   History of CAD, hypertension, hyperlipidemia  Continue Norvasc, Lipitor, Zaroxolyn, Lopressor, Demadex.  Plavix on hold.    Stage III chronic kidney disease  Creatinine stable and at usual baseline values.    HYPONATREMIA, CHRONIC  Sodium stable at 133.    COPD (chronic obstructive pulmonary disease)  Continue Xopenex as needed.    MITRAL VALVE REPLACEMENT, HX OF / S/P AVR / Chronic anticoagulation  Coumadin and Plavix currently on hold.  INR 2.17.    Acute on chronic respiratory failure with hypoxia  Treated with nonrebreather mask on admission, and continues to need this to maintain oxygen status.    Chronic diastolic CHF (congestive heart failure)  Last 2-D echo done 07/2013, EF 50-55 percent.    DVT Prophylaxis  Code Status: Full. Family Communication: No family at the bedside. Disposition Plan: Home when stable.   IV Access:    Peripheral IV   Procedures and diagnostic studies:   Dg Chest Portable 1 View 05/22/2014: 1. Findings consistent with congestive heart failure with cardiomegaly, interstitial edema and small  effusions.     Medical Consultants:    Dr. Jodi Marble, ENT  Anti-Infectives:    None.  Subjective:    [name] is a bit restless with the non-rebreather mask on, which is soiled with dried blood.  No dyspnea.  No current complaints.  Objective:    Filed Vitals:   05/14/14 0100 05/14/14 0130 05/14/14 0238 05/14/14 0524  BP: 142/68 142/83 127/61 129/63  Pulse: 74 41 63 78  Temp:   97.7 F (36.5 C) 97.4 F (36.3 C)  TempSrc:   Axillary Axillary  Resp: 18 23  19   Height:   5\' 6"  (1.676 m)   Weight:   71.305 kg (157 lb 3.2 oz)   SpO2: 98% 97% 100% 100%    Intake/Output Summary (Last 24 hours) at 05/14/14 0757 Last data filed at 05/14/14 0720  Gross per 24 hour  Intake      0 ml  Output   1025 ml  Net  -1025 ml    Exam: Gen:  NAD Cardiovascular:  RRR, No M/R/G Respiratory:  Lungs diminished Gastrointestinal:  Abdomen soft, NT/ND, + BS Extremities:  No C/E/C, multiple ecchymosis.   Data Reviewed:    Labs: Basic Metabolic Panel:  Recent Labs Lab 05/19/2014 2201 05/14/14 0358  NA 133* 133*  K 4.7 4.7  CL 94* 92*  CO2 29 29  GLUCOSE 92 83  BUN 50* 49*  CREATININE 1.41* 1.37*  CALCIUM 8.6 8.8   GFR Estimated Creatinine Clearance: 46.6 mL/min (by C-G formula based on Cr of 1.37).  Coagulation profile  Recent Labs Lab 05/25/2014  2201 05/14/14 0358  INR 2.28* 2.17*    CBC:  Recent Labs Lab 05/22/2014 2201 05/14/14 0358  WBC 6.8 6.6  HGB 10.3* 10.5*  HCT 33.3* 34.5*  MCV 91.7 92.5  PLT 231 258   Cardiac Enzymes:  Recent Labs Lab 05/19/2014 2157  TROPONINI <0.30   BNP (last 3 results)  Recent Labs  11/26/13 1826 02/06/14 0549 05/31/2014 2157  PROBNP 12192.0* 17258.0* 13471.0*   Microbiology No results found for this or any previous visit (from the past 240 hour(s)).   Medications:   . amLODipine  10 mg Oral QHS  . atorvastatin  80 mg Oral Daily  . folic acid  1 mg Oral Daily  . lidocaine      . metoprolol  100 mg  Oral BID  . multivitamin with minerals  1 tablet Oral Daily  . oxymetazoline      . pantoprazole  40 mg Oral Daily  . sodium chloride  3 mL Intravenous Q12H  . thiamine  100 mg Oral Daily  . torsemide  60 mg Oral BID   Continuous Infusions:   Time spent: 25 minutes.   LOS: 1 day   RAMA,CHRISTINA  [facility] Pager [phone]. If unable to reach me by pager, please call my cell phone at [phone].  *Please refer to [email or web address], password [email or web address] to get updated schedule on who will round on this patient, as hospitalists switch teams weekly. If 7PM-7AM, please contact night-coverage at [email or web address], password [email or web address] for any overnight needs.  05/14/2014, 7:57 AM

## 2014-05-15 ENCOUNTER — Inpatient Hospital Stay (HOSPITAL_COMMUNITY): Payer: Medicare Other | Admitting: Anesthesiology

## 2014-05-15 ENCOUNTER — Inpatient Hospital Stay (HOSPITAL_COMMUNITY): Payer: Medicare Other

## 2014-05-15 DIAGNOSIS — R4182 Altered mental status, unspecified: Secondary | ICD-10-CM

## 2014-05-15 DIAGNOSIS — J438 Other emphysema: Secondary | ICD-10-CM

## 2014-05-15 DIAGNOSIS — J9600 Acute respiratory failure, unspecified whether with hypoxia or hypercapnia: Secondary | ICD-10-CM

## 2014-05-15 DIAGNOSIS — R40244 Other coma, without documented Glasgow coma scale score, or with partial score reported: Secondary | ICD-10-CM

## 2014-05-15 LAB — BASIC METABOLIC PANEL
Anion gap: 12 (ref 5–15)
BUN: 53 mg/dL — AB (ref 6–23)
CO2: 29 meq/L (ref 19–32)
Calcium: 8.9 mg/dL (ref 8.4–10.5)
Chloride: 95 mEq/L — ABNORMAL LOW (ref 96–112)
Creatinine, Ser: 1.36 mg/dL — ABNORMAL HIGH (ref 0.50–1.35)
GFR calc non Af Amer: 52 mL/min — ABNORMAL LOW (ref >90)
GFR, EST AFRICAN AMERICAN: 60 mL/min — AB (ref >90)
Glucose, Bld: 89 mg/dL (ref 70–99)
Potassium: 4.8 mEq/L (ref 3.7–5.3)
Sodium: 136 mEq/L — ABNORMAL LOW (ref 137–147)

## 2014-05-15 LAB — BLOOD GAS, ARTERIAL
ACID-BASE EXCESS: 6.8 mmol/L — AB (ref 0.0–2.0)
Acid-Base Excess: 2.8 mmol/L — ABNORMAL HIGH (ref 0.0–2.0)
Acid-Base Excess: 3.5 mmol/L — ABNORMAL HIGH (ref 0.0–2.0)
Acid-Base Excess: 6.9 mmol/L — ABNORMAL HIGH (ref 0.0–2.0)
BICARBONATE: 31.7 meq/L — AB (ref 20.0–24.0)
Bicarbonate: 31.2 mEq/L — ABNORMAL HIGH (ref 20.0–24.0)
Bicarbonate: 30.8 mEq/L — ABNORMAL HIGH (ref 20.0–24.0)
Bicarbonate: 32 mEq/L — ABNORMAL HIGH (ref 20.0–24.0)
DRAWN BY: 35135
Drawn by: 36496
Drawn by: 276051
Drawn by: 276051
FIO2: 1 %
FIO2: 1 %
FIO2: 0.7 %
FIO2: 0.5 %
LHR: 24 {breaths}/min
MECHVT: 500 mL
O2 CONTENT: 15 L/min
O2 SAT: 89.4 %
O2 SAT: 87.1 %
O2 Saturation: 81.6 %
O2 Saturation: 86.9 %
PATIENT TEMPERATURE: 97.8
PATIENT TEMPERATURE: 98.6
PATIENT TEMPERATURE: 98.6
PCO2 ART: 66.2 mmHg — AB (ref 35.0–45.0)
PEEP/CPAP: 5 cmH2O
PEEP/CPAP: 10 cmH2O
PEEP: 8 cmH2O
PH ART: 7.427 (ref 7.350–7.450)
Patient temperature: 98.6
RATE: 18 resp/min
RATE: 24 resp/min
TCO2: 30.1 mmol/L (ref 0–100)
TCO2: 29.4 mmol/L (ref 0–100)
TCO2: 29.6 mmol/L (ref 0–100)
TCO2: 30 mmol/L (ref 0–100)
VT: 440 mL
VT: 380 mL
pCO2 arterial: 73.5 mmHg (ref 35.0–45.0)
pCO2 arterial: 49 mmHg — ABNORMAL HIGH (ref 35.0–45.0)
pCO2 arterial: 52.1 mmHg — ABNORMAL HIGH (ref 35.0–45.0)
pH, Arterial: 7.248 — ABNORMAL LOW (ref 7.350–7.450)
pH, Arterial: 7.289 — ABNORMAL LOW (ref 7.350–7.450)
pH, Arterial: 7.406 (ref 7.350–7.450)
pO2, Arterial: 52.5 mmHg — ABNORMAL LOW (ref 80.0–100.0)
pO2, Arterial: 58.5 mmHg — ABNORMAL LOW (ref 80.0–100.0)
pO2, Arterial: 55.5 mmHg — ABNORMAL LOW (ref 80.0–100.0)
pO2, Arterial: 55.4 mmHg — ABNORMAL LOW (ref 80.0–100.0)

## 2014-05-15 LAB — GLUCOSE, CAPILLARY
Glucose-Capillary: 93 mg/dL (ref 70–99)
Glucose-Capillary: 122 mg/dL — ABNORMAL HIGH (ref 70–99)

## 2014-05-15 LAB — CBC
HCT: 34.1 % — ABNORMAL LOW (ref 39.0–52.0)
Hemoglobin: 10 g/dL — ABNORMAL LOW (ref 13.0–17.0)
MCH: 27.5 pg (ref 26.0–34.0)
MCHC: 29.3 g/dL — ABNORMAL LOW (ref 30.0–36.0)
MCV: 93.7 fL (ref 78.0–100.0)
PLATELETS: 243 10*3/uL (ref 150–400)
RBC: 3.64 MIL/uL — ABNORMAL LOW (ref 4.22–5.81)
RDW: 17.1 % — ABNORMAL HIGH (ref 11.5–15.5)
WBC: 9.1 10*3/uL (ref 4.0–10.5)

## 2014-05-15 LAB — TRIGLYCERIDES: TRIGLYCERIDES: 85 mg/dL (ref <150)

## 2014-05-15 LAB — MRSA PCR SCREENING: MRSA by PCR: NEGATIVE

## 2014-05-15 LAB — PROCALCITONIN

## 2014-05-15 MED ORDER — SUCCINYLCHOLINE CHLORIDE 20 MG/ML IJ SOLN
INTRAMUSCULAR | Status: DC | PRN
  Administered 2014-05-15: 100 mg via INTRAVENOUS

## 2014-05-15 MED ORDER — CETYLPYRIDINIUM CHLORIDE 0.05 % MT LIQD
7.0000 mL | Freq: Four times a day (QID) | OROMUCOSAL | Status: DC
  Administered 2014-05-15 – 2014-05-26 (×44): 7 mL via OROMUCOSAL

## 2014-05-15 MED ORDER — ETOMIDATE 2 MG/ML IV SOLN
INTRAVENOUS | Status: AC
  Filled 2014-05-15: qty 10

## 2014-05-15 MED ORDER — LORAZEPAM 0.5 MG PO TABS
0.5000 mg | ORAL_TABLET | Freq: Once | ORAL | Status: AC
  Administered 2014-05-15: 0.5 mg via ORAL
  Filled 2014-05-15: qty 1

## 2014-05-15 MED ORDER — FUROSEMIDE 10 MG/ML IJ SOLN
40.0000 mg | Freq: Four times a day (QID) | INTRAMUSCULAR | Status: AC
  Administered 2014-05-15 (×3): 40 mg via INTRAVENOUS
  Filled 2014-05-15 (×3): qty 4

## 2014-05-15 MED ORDER — VANCOMYCIN HCL 1000 MG IV SOLR
750.0000 mg | Freq: Two times a day (BID) | INTRAVENOUS | Status: DC
  Administered 2014-05-15 – 2014-05-16 (×3): 750 mg via INTRAVENOUS
  Filled 2014-05-15 (×3): qty 750

## 2014-05-15 MED ORDER — IPRATROPIUM BROMIDE 0.02 % IN SOLN: 0.5000 mg | RESPIRATORY_TRACT | Status: DC | PRN

## 2014-05-15 MED ORDER — PIPERACILLIN-TAZOBACTAM 3.375 G IVPB
3.3750 g | Freq: Three times a day (TID) | INTRAVENOUS | Status: DC
  Administered 2014-05-15 – 2014-05-19 (×13): 3.375 g via INTRAVENOUS
  Filled 2014-05-15 (×5): qty 50

## 2014-05-15 MED ORDER — PROPOFOL 10 MG/ML IV EMUL
0.0000 ug/kg/min | INTRAVENOUS | Status: DC
  Administered 2014-05-15: 30 ug/kg/min via INTRAVENOUS
  Administered 2014-05-15: 20 ug/kg/min via INTRAVENOUS
  Administered 2014-05-15: 10 ug/kg/min via INTRAVENOUS
  Administered 2014-05-16: 10.051 ug/kg/min via INTRAVENOUS
  Administered 2014-05-16: 20 ug/kg/min via INTRAVENOUS
  Administered 2014-05-17: 15 ug/kg/min via INTRAVENOUS
  Administered 2014-05-17: 35 ug/kg/min via INTRAVENOUS
  Administered 2014-05-17: 25 ug/kg/min via INTRAVENOUS
  Administered 2014-05-18 – 2014-05-19 (×6): 40 ug/kg/min via INTRAVENOUS
  Administered 2014-05-19: 50 ug/kg/min via INTRAVENOUS
  Administered 2014-05-19 – 2014-05-20 (×7): 40 ug/kg/min via INTRAVENOUS
  Administered 2014-05-21: 45 ug/kg/min via INTRAVENOUS
  Administered 2014-05-21: 40 ug/kg/min via INTRAVENOUS
  Administered 2014-05-21: 45 ug/kg/min via INTRAVENOUS
  Administered 2014-05-21 – 2014-05-24 (×12): 40 ug/kg/min via INTRAVENOUS
  Administered 2014-05-24 – 2014-05-25 (×3): 20 ug/kg/min via INTRAVENOUS
  Administered 2014-05-25 (×3): 35 ug/kg/min via INTRAVENOUS
  Administered 2014-05-25: 30 ug/kg/min via INTRAVENOUS
  Administered 2014-05-26: 35 ug/kg/min via INTRAVENOUS
  Filled 2014-05-15 (×44): qty 100

## 2014-05-15 MED ORDER — FUROSEMIDE 10 MG/ML IJ SOLN
20.0000 mg | Freq: Once | INTRAMUSCULAR | Status: AC
  Administered 2014-05-15: 20 mg via INTRAVENOUS

## 2014-05-15 MED ORDER — VITAL HIGH PROTEIN PO LIQD
1000.0000 mL | ORAL | Status: DC
  Administered 2014-05-15 – 2014-05-16 (×3): 1000 mL
  Filled 2014-05-15 (×2): qty 1000

## 2014-05-15 MED ORDER — VITAMIN B-1 100 MG PO TABS
100.0000 mg | ORAL_TABLET | Freq: Every day | ORAL | Status: DC
  Administered 2014-05-15 – 2014-05-26 (×12): 100 mg
  Filled 2014-05-15 (×12): qty 1

## 2014-05-15 MED ORDER — SODIUM CHLORIDE 0.9 % IV SOLN
0.0000 ug/h | INTRAVENOUS | Status: DC
  Administered 2014-05-15: 50 ug/h via INTRAVENOUS
  Administered 2014-05-16: 10 ug/h via INTRAVENOUS
  Filled 2014-05-15 (×3): qty 50

## 2014-05-15 MED ORDER — CHLORHEXIDINE GLUCONATE 0.12 % MT SOLN
15.0000 mL | Freq: Two times a day (BID) | OROMUCOSAL | Status: DC
  Filled 2014-05-15: qty 15

## 2014-05-15 MED ORDER — FOLIC ACID 1 MG PO TABS
1.0000 mg | ORAL_TABLET | Freq: Every day | ORAL | Status: DC
  Administered 2014-05-15 – 2014-05-26 (×12): 1 mg
  Filled 2014-05-15 (×12): qty 1

## 2014-05-15 MED ORDER — PANTOPRAZOLE SODIUM 40 MG IV SOLR
40.0000 mg | Freq: Every day | INTRAVENOUS | Status: DC
  Administered 2014-05-15 – 2014-05-20 (×6): 40 mg via INTRAVENOUS
  Filled 2014-05-15 (×6): qty 40

## 2014-05-15 MED ORDER — ADULT MULTIVITAMIN W/MINERALS CH
1.0000 | ORAL_TABLET | Freq: Every day | ORAL | Status: DC
  Administered 2014-05-15 – 2014-05-16 (×2): 1
  Filled 2014-05-15: qty 1

## 2014-05-15 MED ORDER — ALBUTEROL SULFATE (2.5 MG/3ML) 0.083% IN NEBU: 2.5000 mg | INHALATION_SOLUTION | RESPIRATORY_TRACT | Status: DC | PRN

## 2014-05-15 MED ORDER — CHLORHEXIDINE GLUCONATE 0.12 % MT SOLN
15.0000 mL | Freq: Two times a day (BID) | OROMUCOSAL | Status: DC
  Administered 2014-05-15 – 2014-05-26 (×23): 15 mL via OROMUCOSAL
  Filled 2014-05-15 (×22): qty 15

## 2014-05-15 MED ORDER — IPRATROPIUM-ALBUTEROL 0.5-2.5 (3) MG/3ML IN SOLN
3.0000 mL | Freq: Four times a day (QID) | RESPIRATORY_TRACT | Status: DC
  Administered 2014-05-15 – 2014-05-26 (×45): 3 mL via RESPIRATORY_TRACT
  Filled 2014-05-15 (×45): qty 3

## 2014-05-15 MED ORDER — FENTANYL CITRATE 0.05 MG/ML IJ SOLN
50.0000 ug | INTRAMUSCULAR | Status: DC | PRN
  Administered 2014-05-15: 50 ug via INTRAVENOUS

## 2014-05-15 MED ORDER — FUROSEMIDE 10 MG/ML IJ SOLN: 40.0000 mg | Freq: Two times a day (BID) | INTRAMUSCULAR | Status: DC

## 2014-05-15 MED ORDER — VITAL HIGH PROTEIN PO LIQD
1000.0000 mL | ORAL | Status: DC
  Filled 2014-05-15: qty 1000

## 2014-05-15 MED ORDER — CETYLPYRIDINIUM CHLORIDE 0.05 % MT LIQD
7.0000 mL | Freq: Four times a day (QID) | OROMUCOSAL | Status: DC
  Administered 2014-05-15 (×2): 7 mL via OROMUCOSAL

## 2014-05-15 MED ORDER — FENTANYL BOLUS VIA INFUSION
25.0000 ug | INTRAVENOUS | Status: DC | PRN
  Administered 2014-05-16: 25 ug via INTRAVENOUS
  Filled 2014-05-15: qty 50

## 2014-05-15 MED ORDER — FENTANYL CITRATE 0.05 MG/ML IJ SOLN
50.0000 ug | Freq: Once | INTRAMUSCULAR | Status: AC
  Administered 2014-05-15: 50 ug via INTRAVENOUS

## 2014-05-15 MED ORDER — ATORVASTATIN CALCIUM 40 MG PO TABS
80.0000 mg | ORAL_TABLET | Freq: Every day | ORAL | Status: DC
  Administered 2014-05-15 – 2014-05-25 (×11): 80 mg
  Filled 2014-05-15 (×12): qty 2

## 2014-05-15 MED ORDER — SALINE SPRAY 0.65 % NA SOLN
1.0000 | NASAL | Status: DC | PRN
  Administered 2014-05-15: 1 via NASAL
  Filled 2014-05-15: qty 44

## 2014-05-15 MED ORDER — ETOMIDATE 2 MG/ML IV SOLN
INTRAVENOUS | Status: DC | PRN
  Administered 2014-05-15: 12 mg via INTRAVENOUS

## 2014-05-15 MED ORDER — MIDAZOLAM HCL 2 MG/2ML IJ SOLN
1.0000 mg | INTRAMUSCULAR | Status: DC | PRN
  Administered 2014-05-16: 2 mg via INTRAVENOUS
  Administered 2014-05-17 (×2): 1 mg via INTRAVENOUS
  Filled 2014-05-15 (×3): qty 2

## 2014-05-15 MED ORDER — FUROSEMIDE 10 MG/ML IJ SOLN
INTRAMUSCULAR | Status: AC
  Filled 2014-05-15: qty 2

## 2014-05-15 NOTE — Progress Notes (Signed)
ANTIBIOTIC CONSULT NOTE - INITIAL  Pharmacy Consult for Zosyn and Vancomycin Indication: PNA  No Known Allergies  Patient Measurements: Height: 5\' 6"  (167.6 cm) Weight: 157 lb 3.2 oz (71.305 kg) IBW/kg (Calculated) : 63.8   Vital Signs: Temp: 97.8 F (36.6 C) (11/09 2122) Temp Source: Axillary (11/09 2122) BP: 118/60 mmHg (11/10 0445) Pulse Rate: 79 (11/10 0445) Intake/Output from previous day: 11/09 0701 - 11/10 0700 In: -  Out: 950 [Urine:950] Intake/Output from this shift: Total I/O In: -  Out: 550 [Urine:550]  Labs:  Recent Labs  05/06/2014 2201 05/14/14 0358  WBC 6.8 6.6  HGB 10.3* 10.5*  PLT 231 258  CREATININE 1.41* 1.37*   Estimated Creatinine Clearance: 46.6 mL/min (by C-G formula based on Cr of 1.37). No results for input(s): VANCOTROUGH, VANCOPEAK, VANCORANDOM, GENTTROUGH, GENTPEAK, GENTRANDOM, TOBRATROUGH, TOBRAPEAK, TOBRARND, AMIKACINPEAK, AMIKACINTROU, AMIKACIN in the last 72 hours.   Microbiology: No results found for this or any previous visit (from the past 720 hour(s)).  Medical History: Past Medical History  Diagnosis Date  . Essential hypertension, benign   . Coronary atherosclerosis of native coronary artery     a. CABG/MVR/AVR-2003 (#25 St. Jude/#21 St. Jude); anticoagulation; b. negative stress nuclear-2005;  c. 01/2013 NSTEMI/Cath/PCI: LM nl, LAD 50p, 40-38m, D1 50ost, LCX 78m, RCA 50/68m (2.5x20 Promus DES), 50d, VG->Diag 100, VG->PDA 100, VG->OM 100, LIMA->LAD ok.  . Epistaxis     Requiring cautery & aterial ligation-09/2009  . GERD (gastroesophageal reflux disease)   . Hyperlipemia   . Abnormal LFTs     Possible cirrhosis  . Chronic anticoagulation   . Valvular heart disease     a. 2003: MVR/AVR-2003 (#25 St. Jude/#21 St. Jude);  b. 01/2013 Echo: EF 55%, Mech AVR mean grad 12, Mech MVR mean grad 6.  . Tobacco abuse     45 pack years  . Fasting hyperglycemia   . Nephrolithiasis   . Diverticulosis   . Hyponatremia   . Chronic  diastolic CHF (congestive heart failure)   . Chronic renal disease   . Hemolytic anemia   . ETOH abuse   . Atrial fibrillation 01/16/2013    On coumadin DCCV 07/2013.   Marland Kitchen COPD (chronic obstructive pulmonary disease)   . Stage III chronic kidney disease     Medications:  Scheduled:  . antiseptic oral rinse  7 mL Mouth Rinse QID  . atorvastatin  80 mg Per Tube Daily  . chlorhexidine  15 mL Mouth Rinse BID  . folic acid  1 mg Per Tube Daily  . furosemide      . ipratropium-albuterol  3 mL Nebulization Q6H  . lidocaine   Topical Once  . multivitamin with minerals  1 tablet Per Tube Daily  . oxymetazoline  1 spray Each Nare Once  . pantoprazole (PROTONIX) IV  40 mg Intravenous Daily  . piperacillin-tazobactam (ZOSYN)  IV  3.375 g Intravenous 3 times per day  . sodium chloride  3 mL Intravenous Q12H  . thiamine  100 mg Per Tube Daily  . vancomycin (VANCOCIN) 750 mg IVPB  750 mg Intravenous Q12H   Infusions:  . propofol     Assessment: 89 yoM admitted with epistaxis on warfarin and plavix PTA. Now Vancomycin and Zosyn per Rx for PNA.   Goal of Therapy:  Vancomycin trough level 15-20 mcg/ml  Plan:   Zosyn 3.375 Gm IV q8h EI  Vancomycin 750mg  IV q12h  F/U SCr/levels/cultures as needed  Lawana Pai R 05/15/2014,5:21 AM

## 2014-05-15 NOTE — Procedures (Signed)
Central Venous Catheter Insertion Procedure Note [name] [id number] 1946/03/07  Procedure: Insertion of Central Venous Catheter Indications: Assessment of intravascular volume, Drug and/or fluid administration and Frequent blood sampling  Procedure Details Consent: Risks of procedure as well as the alternatives and risks of each were explained to the (patient/caregiver).  Consent for procedure obtained. Time Out: Verified patient identification, verified procedure, site/side was marked, verified correct patient position, special equipment/implants available, medications/allergies/relevent history reviewed, required imaging and test results available.  Performed  Maximum sterile technique was used including antiseptics, cap, gloves, gown, hand hygiene, mask and sheet. Skin prep: Chlorhexidine; local anesthetic administered A antimicrobial bonded/coated triple lumen catheter was placed in the right internal jugular vein using the Seldinger technique.  Evaluation Blood flow good Complications: No apparent complications Patient did tolerate procedure well. Chest X-ray ordered to verify placement.  CXR: pending.  U/S used in placement.  [name] 05/15/2014, 6:05 AM

## 2014-05-15 NOTE — Progress Notes (Signed)
Shift event: RN paged this NP secondary to pt desatting into the high 70s, low 80s on 100%NRB. RT gave Xopenex treatment and suctioned posterior oropharynx without improving hypoxia. ABG, CXR ordered and NP to bedside.  Pt admitted 05/06/2014 with epistaxis, SOB and hypoxia. Pt has multiple medical issues including COPD with O2 dependence on 2L [place] at home, Diastolic CHF with last EF 50%, CAD, mechanical heart valves, Afib, and chronic anticoagulation for heart valves, CAD and Afib. His right nares was packed in ED then surgicel placed by ENT with resultant coagulation of epistaxis. Pt required the NRB during hospitalization to maintain O2 sats and because of nasal packing. Anticoagulation meds on hold since admission.   S: Pt can not participate in ROS secondary to lethargy/confusion.  O: Elderly WM, chronically ill appearing and appears older than stated age. Confused, uncooperative, agitated at times.  In mild respiratory distress with increased WOB. O2 sats as high as 88% on NRB but mostly in the low 80s.  Lungs with diminished breath sounds. No wheezing. No overt crackles. Skin is pale, warm, dry. Right nares packed, no active bleeding. A/P: 1. Hypoxia- multifocal but consider aspiration of blood as source. Also, CHF, COPD. ABG showed low pH, low PO2 and high PCO2. Given packing, risk of aspiration, and epistaxis, did not feel pt was an appropriate candidate for bipap, so pt transferred to ICU and intubated by CRNA. Dr. Jimmy Footman of PCCM aware of issues with pt and was observing by camera when pt transferred to ICU. Intubation successful. Junius Roads, PA and Dr. Nelda Marseille here post intubation to see pt and manage pt's care while intubated.  Prior to transfer and intubation, this NP called and spoke to pt's sister n law, Webb Silversmith, who is listed as medical POA for pt. Webb Silversmith states pt is a FULL CODE and intubation is fine if necessary.  Clance Boll, NP [facility]

## 2014-05-15 NOTE — Plan of Care (Signed)
Problem: Phase I Progression Outcomes Goal: VTE prophylaxis Outcome: Completed/Met Date Met:  05/15/14 Goal: GIProphysixis Outcome: Completed/Met Date Met:  05/15/14 Goal: Oral Care per Protocol Outcome: Completed/Met Date Met:  05/15/14

## 2014-05-15 NOTE — Consult Note (Signed)
PULMONARY / CRITICAL CARE MEDICINE   Name: [name] MRN: [id number] DOB: Nov 12, 1945    ADMISSION DATE:  05/07/2014 CONSULTATION DATE:  05/15/2014  REFERRING MD :  Rama  CHIEF COMPLAINT:  Respiratory failure in setting epistaxis  INITIAL PRESENTATION:  68 y.o. M brought to [facility] 11/8 with epistaxis.  Had rhinopack placed with temporary cessation of bleeding.  Seen by ENT 11/9 and had surgicel packing placed with apparent cessation of bleeding.  In early AM hours 11/10, developed worsening dyspnea despite NRB mask.  He was transferred to the ICU and intubated by CRNA.     STUDIES:  None  SIGNIFICANT EVENTS: 11/8 - admitted 11/9 - ENT consult 11/10 - transferred to ICU, intubated   HISTORY OF PRESENT ILLNESS:  Pt is encephalopathic; therefore, this HPI is obtained from chart review. [name] is a 68 y.o. M with PMH as outlined below which includes chronic 2L O2 via [place].  He presented 11/8 with complaints of nosebleeds.  He has a hx of frequent nosebleeds due to being on warfarin and plavix for hx of 2 mechanical heart valves.  During nosebleeds, he usually has some mild respiratory problems.  In ED, he had rhinopack placed and bleeding had temporarily stopped; however, on 11/9, it continued.  He was evaluated by ENT and had bedside procedure performed with apparent cessation of bleeding.  During early AM hours of 11/10, pt became more dyspneic and SpO2 remained in 80's despite NRB.  He was transferred to ICU and intubated by CRNA.  PCCM was consulted.  PAST MEDICAL HISTORY :   has a past medical history of Essential hypertension, benign; Coronary atherosclerosis of native coronary artery; Epistaxis; GERD (gastroesophageal reflux disease); Hyperlipemia; Abnormal LFTs; Chronic anticoagulation; Valvular heart disease; Tobacco abuse; Fasting hyperglycemia; Nephrolithiasis; Diverticulosis; Hyponatremia; Chronic diastolic CHF (congestive heart failure); Chronic renal disease; Hemolytic  anemia; ETOH abuse; Atrial fibrillation (01/16/2013); COPD (chronic obstructive pulmonary disease); and Stage III chronic kidney disease.  has past surgical history that includes Endocopic sphenopalatine artery ligation & cautry; Inguinal hernia repair; Coronary artery bypass graft (11/2001); Cardiac valve replacement (11/2001); Cataract extraction w/PHACO (01/20/2011); Cataract extraction w/PHACO (02/03/2011); Colonoscopy (04/05/02; 08/2011); Esophagogastroduodenoscopy (01/2002); Colonoscopy (09/02/2011); and Yag laser application (Right, 93/81/0175). Prior to Admission medications   Medication Sig Start Date End Date Taking? Authorizing Provider  amLODipine (NORVASC) 10 MG tablet Take 10 mg by mouth at bedtime.  12/15/13  Yes Historical Provider, MD  atorvastatin (LIPITOR) 80 MG tablet Take 1 tablet (80 mg total) by mouth daily. 03/13/14  Yes Arnoldo Lenis, MD  clopidogrel (PLAVIX) 75 MG tablet Take 75 mg by mouth daily.   Yes Historical Provider, MD  ferrous sulfate 325 (65 FE) MG EC tablet Take 325 mg by mouth daily with breakfast.   Yes Historical Provider, MD  folic acid (FOLVITE) 1 MG tablet Take 1 tablet (1 mg total) by mouth daily. 01/22/13  Yes Brooke O Edmisten, PA-C  levalbuterol (XOPENEX) 0.63 MG/3ML nebulizer solution Take 3 mLs (0.63 mg total) by nebulization every 6 (six) hours as needed for wheezing or shortness of breath. 01/22/13  Yes Brooke O Edmisten, PA-C  loperamide (IMODIUM) 2 MG capsule Take 2 capsules (4 mg total) by mouth every 12 (twelve) hours as needed for diarrhea or loose stools. 12/06/13  Yes Alonza Bogus, MD  magnesium oxide (MAG-OX) 400 MG tablet Take 400 mg by mouth daily.   Yes Historical Provider, MD  metolazone (ZAROXOLYN) 2.5 MG tablet Take 2.5 mg by mouth as  needed (Fluid).   Yes Historical Provider, MD  metoprolol (LOPRESSOR) 100 MG tablet Take 100 mg by mouth 2 (two) times daily.   Yes Historical Provider, MD  Multiple Vitamin (MULTIVITAMIN WITH MINERALS) TABS Take 1  tablet by mouth daily. 01/22/13  Yes Brooke O Edmisten, PA-C  pantoprazole (PROTONIX) 40 MG tablet Take 40 mg by mouth daily.   Yes Historical Provider, MD  potassium chloride SA (K-DUR,KLOR-CON) 20 MEQ tablet Take 1 tablet (20 mEq total) by mouth daily. 12/18/13  Yes Arnoldo Lenis, MD  thiamine 100 MG tablet Take 1 tablet (100 mg total) by mouth daily. 01/22/13  Yes Brooke O Edmisten, PA-C  torsemide (DEMADEX) 20 MG tablet Take 3 tablets (60 mg total) by mouth 2 (two) times daily. 03/13/14  Yes Arnoldo Lenis, MD  warfarin (COUMADIN) 5 MG tablet Take 2.5-5 mg by mouth daily. Takes 5 mg on Mon, Wed, Fri and takes 2.5 mg all other days.   Yes Historical Provider, MD  acetaminophen (TYLENOL) 500 MG tablet Take 1,000 mg by mouth daily as needed for mild pain or headache.    Historical Provider, MD  clopidogrel (PLAVIX) 75 MG tablet TAKE ONE TABLET BY MOUTH DAILY WITH BREAKFAST Patient not taking: Reported on 05/10/2014 03/01/14   Arnoldo Lenis, MD  magnesium oxide (MAG-OX) 400 (241.3 MG) MG tablet TAKE ONE TABLET BY MOUTH ONCE DAILY Patient not taking: Reported on 05/08/2014 05/08/14   Arnoldo Lenis, MD  metolazone (ZAROXOLYN) 2.5 MG tablet Take 2.5 mg (1 tablet) every week as needed for swelling. Patient not taking: Reported on 05/23/2014 01/26/14   Arnoldo Lenis, MD  metoprolol (LOPRESSOR) 100 MG tablet TAKE ONE TABLET BY MOUTH TWICE DAILY Patient not taking: Reported on 05/11/2014 03/01/14   Arnoldo Lenis, MD  nitroGLYCERIN (NITROSTAT) 0.4 MG SL tablet Place 1 tablet (0.4 mg total) under the tongue every 5 (five) minutes x 3 doses as needed for chest pain. 01/31/13   Rogelia Mire, NP  warfarin (COUMADIN) 5 MG tablet Take 1-1.5 tablets (5-7.5 mg total) by mouth daily. Takes 7.5mg  on Mondays & 5mg  all other days of week Patient not taking: Reported on 05/24/2014 05/08/14   Arnoldo Lenis, MD   No Known Allergies  FAMILY HISTORY:  Family History  Problem Relation Age of Onset  .  Hypotension Neg Hx   . Anesthesia problems Neg Hx   . Malignant hyperthermia Neg Hx   . Pseudochol deficiency Neg Hx   . Colon cancer Neg Hx   . Liver disease Neg Hx     SOCIAL HISTORY:  reports that he quit smoking about 4 months ago. His smoking use included Cigarettes. He has a 13 pack-year smoking history. He quit smokeless tobacco use about 5 months ago. He reports that he drinks about 4.2 oz of alcohol per week. He reports that he does not use illicit drugs.  REVIEW OF SYSTEMS:  Unable to obtain as pt is encephalopathic.  SUBJECTIVE:   VITAL SIGNS: Temp:  [97.4 F (36.3 C)-98 F (36.7 C)] 97.8 F (36.6 C) (11/09 2122) Pulse Rate:  [61-90] 79 (11/10 0445) Resp:  [18-24] 18 (11/10 0445) BP: (105-136)/(60-72) 118/60 mmHg (11/10 0445) SpO2:  [92 %-100 %] 100 % (11/10 0445) FiO2 (%):  [100 %] 100 % (11/10 0445) HEMODYNAMICS:   VENTILATOR SETTINGS: Vent Mode:  [-]  FiO2 (%):  [100 %] 100 % INTAKE / OUTPUT: Intake/Output      11/09 0701 - 11/10 0700  Urine (mL/kg/hr) 950 (0.6)   Total Output 950   Net -950         PHYSICAL EXAMINATION: General: Elderly male, chronically ill appearing, Sedated, in NAD. Neuro: Sedated on vent. HEENT: [place]/AT. PERRL, sclerae anicteric.  B/l nares packed, dried blood noted. Cardiovascular: IRIR, SEM with click noted.  Lungs: Respirations even and unlabored.  CTA bilaterally, No W/R/R. Abdomen: BS x 4, soft, NT/ND.  Musculoskeletal: No gross deformities, no edema.  Skin: Multiple small skin excoriations with scabs, multiple bruises, warm, no rashes.  LABS:  CBC  Recent Labs Lab 05/19/2014 2201 05/14/14 0358  WBC 6.8 6.6  HGB 10.3* 10.5*  HCT 33.3* 34.5*  PLT 231 258   Coag's  Recent Labs Lab 05/09/2014 2201 05/14/14 0358  INR 2.28* 2.17*   BMET  Recent Labs Lab 05/21/2014 2201 05/14/14 0358  NA 133* 133*  K 4.7 4.7  CL 94* 92*  CO2 29 29  BUN 50* 49*  CREATININE 1.41* 1.37*  GLUCOSE 92 83   Electrolytes  Recent  Labs Lab 05/30/2014 2201 05/14/14 0358  CALCIUM 8.6 8.8   Sepsis Markers No results for input(s): LATICACIDVEN, PROCALCITON, O2SATVEN in the last 168 hours. ABG  Recent Labs Lab 05/15/14 0351  PHART 7.248*  PCO2ART 73.5*  PO2ART 52.5*   Liver Enzymes No results for input(s): AST, ALT, ALKPHOS, BILITOT, ALBUMIN in the last 168 hours. Cardiac Enzymes  Recent Labs Lab 05/12/2014 2157  TROPONINI <0.30  PROBNP 13471.0*   Glucose No results for input(s): GLUCAP in the last 168 hours.  Imaging Dg Chest Port 1 View  05/15/2014   CLINICAL DATA:  Hypoxia.  Decreased oxygen saturation.  EXAM: PORTABLE CHEST - 1 VIEW  COMPARISON:  Single view of the chest 05/20/2014 02/08/2014.  FINDINGS: There is extensive bilateral airspace disease and right greater than left pleural effusions. Cardiomegaly is noted. No pneumothorax identified.  IMPRESSION: Findings most consistent with congestive heart failure with associated right greater than left pleural effusions.   Electronically Signed   By: Inge Rise M.D.   On: 05/15/2014 04:16   Dg Chest Portable 1 View  05/14/2014   CLINICAL DATA:  Cough and shortness of breath today. Epistaxis today.  EXAM: PORTABLE CHEST - 1 VIEW  COMPARISON:  02/08/2014.  FINDINGS: There are changes from cardiac surgery and valve replacement. Moderate enlargement of the cardiopericardial silhouette. No mediastinal or hilar masses.  There is vascular congestion interstitial thickening bilaterally. Mild hazy opacity at the lung bases suggests small effusions. No pneumothorax. No focal pneumonia.  IMPRESSION: 1. Findings consistent with congestive heart failure with cardiomegaly, interstitial edema and small effusions.   Electronically Signed   By: Lajean Manes M.D.   On: 05/18/2014 21:52     ASSESSMENT / PLAN:  PULMONARY OETT 11/10 >>> A: Acute on chronic hypoxic respiratory failure COPD without evidence of exacerbation Likely blood aspiration Pulmonary  edema Small b/l pleural effusions Tobacco use disorder P:   Full mechanical support. ARDS protocol. VAP bundle. SBT in AM. DuoNebs / Albuterol. ABG and CXR in AM. Tobacco cessation. Diureses.  CARDIOVASCULAR A:  A.fib rate controlled Hx MVR and AVR in 2003 Chronic anticoagulation due to above Hx dCHF, HTN, HLD P:  PO antihypertensives / diuretics discontinued for now. Coumadin / Plavix currently on hold. Needs anticoagulation for mechanical valves but will need to discuss with ENT (warfarin and plavix have been held since admit.  INR currently 2.1). Diureses. Monitor hemodynamics.  RENAL A:   CKD - stable Hyponatremia  P:   NS @ KVO. BMP in AM. Lasix 40 mg IV q6 x3 doses to assist with severe hypoxemia.  GASTROINTESTINAL A:   GERD Nutrition P:   Pantoprazole. NPO. TF if remains NPO > 24 hours.  HEMATOLOGIC A:   Chronic anemia On chronic anticoagulation VTE Prophylaxis P:  Transfuse for Hgb < 7. SCD's only. Needs anticoagulation for mechanical valves but will need to discuss with ENT (warfarin and plavix have been held since admit). CBC and coags in AM.  INFECTIOUS A:   Concern for aspiration PNA P:   Sputum Cx 11/10 >>> Abx: Vanc, start date 11/10, day 1/x. Abx: Zosyn, start date 11/10, day 1/x. PCT algorithm to limit abx exposure.  ENDOCRINE A:   No known issues P:   Monitor glucose on BMP.  NEUROLOGIC A:   Acute metabolic encephalopathy ETOH abuse P:   Sedation:  Propofol gtt / Fentanyl PRN. RASS goal: 0 to -1. Daily WUA. Thiamine / Folate / Multivitamin.  Family updated: None available.  Interdisciplinary Family Meeting v Palliative Care Meeting:  Due by: 11/17.  Montey Hora, [facility] Pgr: [phone]  or [phone] 05/15/2014, 5:04 AM  Above note edited in full, patient likely had significant aspiration of blood from epistaxis.  Packing in place.  Pulmonary edema also  complicating picture with significant desaturation.  Will place on ARDS protocol with full sedation to avoid further desaturation.  Active diureses.  Broad spectrum abx as ordered (vanc added since bleeding source is nasal and I am not sure if patient has MRSA at this point).  Will need assistance from ENT and from cardiology to discuss recommendations for anti-coagulation given severity of illness.  CC time 50 min.  Patient seen and examined, agree with above note.  I dictated the care and orders written for this patient under my direction.  Rush Farmer, MD [phone]

## 2014-05-15 NOTE — Progress Notes (Signed)
PULMONARY / CRITICAL CARE MEDICINE   Name: [name] MRN: [id number] DOB: 12-04-1945    ADMISSION DATE:  05/12/2014 CONSULTATION DATE:  05/15/2014  REFERRING MD :  Rama  CHIEF COMPLAINT:  Respiratory failure in setting epistaxis  INITIAL PRESENTATION:  68 y.o. M brought to [facility] 11/8 with epistaxis.  Had rhinopack placed with temporary cessation of bleeding.  Seen by ENT 11/9 and had surgicel packing placed with apparent cessation of bleeding.  In early AM hours 11/10, developed worsening dyspnea despite NRB mask.  He was transferred to the ICU and intubated by CRNA.    PMH - chronic 2L O2, frequent nosebleeds due to being on warfarin and plavix for hx of 2 mechanical heart valves.    STUDIES:  None  SIGNIFICANT EVENTS: 11/8 - admitted 11/9 - ENT consult 11/10 - transferred to ICU, intubated   SUBJECTIVE: sedated Afebrile no more bleeding requirig high FIO2  VITAL SIGNS: Temp:  [97.6 F (36.4 C)-98 F (36.7 C)] 97.6 F (36.4 C) (11/10 0800) Pulse Rate:  [61-96] 62 (11/10 1100) Resp:  [18-36] 27 (11/10 1100) BP: (82-141)/(39-76) 129/73 mmHg (11/10 1100) SpO2:  [86 %-100 %] 99 % (11/10 1100) FiO2 (%):  [60 %-100 %] 70 % (11/10 1100) Weight:  [69.6 kg (153 lb 7 oz)] 69.6 kg (153 lb 7 oz) (11/10 0700) HEMODYNAMICS:   VENTILATOR SETTINGS: Vent Mode:  [-] PRVC FiO2 (%):  [60 %-100 %] 70 % Set Rate:  [18 bmp-24 bmp] 24 bmp Vt Set:  [440 mL-510 mL] 440 mL PEEP:  [5 cmH20-12 cmH20] 12 cmH20 Plateau Pressure:  [16 cmH20-19 cmH20] 19 cmH20 INTAKE / OUTPUT: Intake/Output      11/09 0701 - 11/10 0700 11/10 0701 - 11/11 0700   I.V. (mL/kg)  36 (0.5)   Other  60   IV Piggyback 50 150   Total Intake(mL/kg) 50 (0.7) 246 (3.5)   Urine (mL/kg/hr) 1050 (0.6)    Total Output 1050 0   Net -1000 +246          PHYSICAL EXAMINATION: General: Elderly male, chronically ill appearing, Sedated, in NAD. Neuro: Sedated on vent. HEENT: Carbon/AT. PERRL, sclerae anicteric.  B/l nares  packed, dried blood noted. Cardiovascular: IRIR, SEM with click noted.  Lungs: Respirations even and unlabored.  CTA bilaterally, No W/R/R. Abdomen: BS x 4, soft, NT/ND.  Musculoskeletal: No gross deformities, no edema.  Skin: Multiple small skin excoriations with scabs, multiple bruises, warm, no rashes.  LABS:  CBC  Recent Labs Lab 05/12/2014 2201 05/14/14 0358 05/15/14 0515  WBC 6.8 6.6 9.1  HGB 10.3* 10.5* 10.0*  HCT 33.3* 34.5* 34.1*  PLT 231 258 243   Coag's  Recent Labs Lab 06/02/2014 2201 05/14/14 0358  INR 2.28* 2.17*   BMET  Recent Labs Lab 05/19/2014 2201 05/14/14 0358 05/15/14 0950  NA 133* 133* 136*  K 4.7 4.7 4.8  CL 94* 92* 95*  CO2 29 29 29   BUN 50* 49* 53*  CREATININE 1.41* 1.37* 1.36*  GLUCOSE 92 83 89   Electrolytes  Recent Labs Lab 05/27/2014 2201 05/14/14 0358 05/15/14 0950  CALCIUM 8.6 8.8 8.9   Sepsis Markers  Recent Labs Lab 05/15/14 0515  PROCALCITON <0.10   ABG  Recent Labs Lab 05/15/14 0351 05/15/14 0528 05/15/14 0900  PHART 7.248* 7.289* 7.427  PCO2ART 73.5* 66.2* 49.0*  PO2ART 52.5* 58.5* 55.5*   Liver Enzymes No results for input(s): AST, ALT, ALKPHOS, BILITOT, ALBUMIN in the last 168 hours. Cardiac Enzymes  Recent  Labs Lab 05/25/2014 2157  TROPONINI <0.30  PROBNP 13471.0*   Glucose No results for input(s): GLUCAP in the last 168 hours.  Imaging Dg Abd 1 View  05/15/2014   CLINICAL DATA:  Orogastric tube placement  EXAM: ABDOMEN - 1 VIEW  COMPARISON:  02/06/2014  FINDINGS: Orogastric tube tip at the proximal stomach. Advancement by 6 cm would provide more stable positioning.  The bowel gas pattern is nonobstructive. Multiple skin or bedding folds are noted over the abdomen. The visualized bowel gas pattern is nonobstructed. Extensive arterial calcification again noted.  IMPRESSION: Orogastric tube tip at the proximal stomach. Advancement by approximately 6 cm would provide more secure positioning.    Electronically Signed   By: Jorje Guild M.D.   On: 05/15/2014 10:22   Dg Chest Port 1 View  05/15/2014   CLINICAL DATA:  Central line placement.  EXAM: PORTABLE CHEST - 1 VIEW  COMPARISON:  Single view of the chest 05/15/2014 at 4:46 a.m.  FINDINGS: New right IJ catheter is in place with the tip projecting over the lower superior vena cava. No pneumothorax is identified. Endotracheal tube is again noted. Extensive bilateral pulmonary edema and small pleural effusions are unchanged.  IMPRESSION: Right IJ catheter tip projects over the lower superior vena cava. Negative for pneumothorax.  No change in bilateral airspace disease most consistent with pulmonary edema.   Electronically Signed   By: Inge Rise M.D.   On: 05/15/2014 06:40   Portable Chest Xray  05/15/2014   CLINICAL DATA:  Status post intubation.  EXAM: PORTABLE CHEST - 1 VIEW  COMPARISON:  Single view of the chest 05/15/2014 at 4:04 a.m.  FINDINGS: New endotracheal tube is in place with the tip in good position just below the clavicular heads. Cardiomegaly, pulmonary edema and pleural effusions persist without change. No pneumothorax.  IMPRESSION: ET tube projects in good position.  No change in pulmonary edema and pleural effusions.   Electronically Signed   By: Inge Rise M.D.   On: 05/15/2014 05:13   Dg Chest Port 1 View  05/15/2014   CLINICAL DATA:  Hypoxia.  Decreased oxygen saturation.  EXAM: PORTABLE CHEST - 1 VIEW  COMPARISON:  Single view of the chest 05/10/2014 02/08/2014.  FINDINGS: There is extensive bilateral airspace disease and right greater than left pleural effusions. Cardiomegaly is noted. No pneumothorax identified.  IMPRESSION: Findings most consistent with congestive heart failure with associated right greater than left pleural effusions.   Electronically Signed   By: Inge Rise M.D.   On: 05/15/2014 04:16   Dg Chest Portable 1 View  05/19/2014   CLINICAL DATA:  Cough and shortness of breath today.  Epistaxis today.  EXAM: PORTABLE CHEST - 1 VIEW  COMPARISON:  02/08/2014.  FINDINGS: There are changes from cardiac surgery and valve replacement. Moderate enlargement of the cardiopericardial silhouette. No mediastinal or hilar masses.  There is vascular congestion interstitial thickening bilaterally. Mild hazy opacity at the lung bases suggests small effusions. No pneumothorax. No focal pneumonia.  IMPRESSION: 1. Findings consistent with congestive heart failure with cardiomegaly, interstitial edema and small effusions.   Electronically Signed   By: Lajean Manes M.D.   On: 06/01/2014 21:52     ASSESSMENT / PLAN:  PULMONARY OETT 11/10 >>> A: Acute on chronic hypoxic respiratory failure ARDS COPD without evidence of exacerbation Likely blood aspiration Pulmonary edema Small b/l pleural effusions Tobacco use disorder P:   Full mechanical support. ARDS protocol -add PEEP 12, lower FIO2, chk ABG  VAP bundle. SBT when able. DuoNebs / Albuterol. Tobacco cessation.   CARDIOVASCULAR A:  A.fib rate controlled Hx mechanical MVR and AVR in 2003 Chronic anticoagulation due to above Hx dCHF, HTN, HLD P:  PO antihypertensives / diuretics discontinued for now. Coumadin / Plavix currently on hold -does he need plavix given recurrent nose bleeds? Will need IV heparin once INR 1.5 or lower - (warfarin and plavix have been held since admit.  INR currently 2.1). Diureses. Monitor hemodynamics.  RENAL A:   CKD - stable Hyponatremia P:   NS @ KVO. BMP in AM. Lasix 40 mg IV q6 x3 doses to assist with severe hypoxemia.  GASTROINTESTINAL A:   GERD Nutrition P:   Pantoprazole. NPO. TF   HEMATOLOGIC A:   Chronic anemia On chronic anticoagulation VTE Prophylaxis P:  Transfuse for Hgb < 7. SCD's only. Daily INR - Needs anticoagulation for mechanical valves but will need to discuss with ENT (warfarin and plavix have been held since admit). CBC and coags in AM.  INFECTIOUS A:    Concern for aspiration PNA P:   Sputum Cx 11/10 >>> Abx: Vanc, start date 11/10 Abx: Zosyn, start date 11/10, PCT algorithm to limit abx exposure -dc ABx in 24h   ENDOCRINE A:   No known issues P:   Monitor glucose on BMP.  NEUROLOGIC A:   Acute metabolic encephalopathy ETOH abuse P:   Sedation:  Propofol gtt / Fentanyl PRN. RASS goal: 0 to -1. Daily WUA. Thiamine / Folate / Multivitamin.  Family updated: s-in law HCPOA - updated 11/10  Interdisciplinary Family Meeting v Palliative Care Meeting:  Due by: 11/17.  Summary - ARDS range hypoxia probably due to aspirated blood with severe underlying COPD & autoPEEP, expect to improve over next 24h, will need Cards input re" need for plavix +coumadin with recurrent life threatening nose bleeds  Care during the described time interval was provided by me and/or other providers on the critical care team.  I have reviewed this patient's available data, including medical history, events of note, physical examination and test results as part of my evaluation  CC time x 36m   05/15/2014, 11:08 AM  Rigoberto Noel, MD

## 2014-05-15 NOTE — Progress Notes (Signed)
Patient noted to be more dyspneic, maintained on NBR mask with O2 sat in low 80's. Lungs sounds diminished with rales and wheezes on the upper lobes. Breathing treatment given with no result. Suctioned mouth with dry old blood. Dried blood also noted on his left ear. Tylene Fantasia came up and examined patient. STAT ABG done, CXR done, lasix 20 mg IV given as ordered. Patient is restless, but remained alert and responsive. To be transferred to ICU.

## 2014-05-15 NOTE — Progress Notes (Signed)
NUTRITION FOLLOW UP  Intervention:   -Initiate Vital High Protein @ 20 ml/hr via OGT and increase by 10 ml every 4 hours to goal rate of 50 ml/hr.  Tube feeding regimen + propofol (422 kcal) provides 1622 kcal (100% of needs), 105 grams of protein, and 1003 ml of H2O. -Ordered adult enteral protocol -RD to continue to monitor  Nutrition Dx:   Inadequate oral intake related to decreased appetite as evidenced by pt report and signs of muscle wasting.; ongoing, now r/t inability to eat and evidenced by NPO status  New Goal:   TF to meet >/= 90% of their estimated nutrition needs    Monitor:   TF tolerance, diet order, total protein/energy intake, labs, weights  Assessment:   11/09: Pt distracted during visit d/t epistaxis. Pt just finished lunch tray prior to visit. Pt ate most of tray. Pt states appetite is improved.  Pt reports no weight loss. UBW of 145-150lb. Pt with CHF and CKD, weight fluctuations may be d/t fluid. Pt states he tries to watch his salt intake at home. PTA he was not interested in eating.   Dietary Recall: B: normally skips L: maybe a sandwich D: another sandwich  RD suspects poor diet quality given history of ETOH abuse.  11/10: -Pt with hypoxia and was transferred to ICU on 11/10 -Discussed pt with RN for possible nutritional needs. Received consult to initiate EN after discussion. -Patient is currently intubated on ventilator support MV: 11.2 L/min Temp (24hrs), Avg:97.8 F (36.6 C), Min:97.6 F (36.4 C), Max:98 F (36.7 C)  Propofol: 16 ml/hr- providing 422 lipid based kcal/daily    Height: Ht Readings from Last 1 Encounters:  05/14/14 5\' 6"  (1.676 m)    Weight Status:   Wt Readings from Last 1 Encounters:  05/15/14 153 lb 7 oz (69.6 kg)    Re-estimated needs:  Kcal: 1616 Protein: 85-95 gram Fluid: 1.8 L/day  Skin: ecchymosis, +1 generalized edema  Diet Order: Diet NPO time specified   Intake/Output Summary (Last 24 hours) at  05/15/14 1146 Last data filed at 05/15/14 1100  Gross per 24 hour  Intake 296.03 ml  Output    650 ml  Net -353.97 ml    Last BM: 11/08   Labs:   Recent Labs Lab 05/08/2014 2201 05/14/14 0358 05/15/14 0950  NA 133* 133* 136*  K 4.7 4.7 4.8  CL 94* 92* 95*  CO2 29 29 29   BUN 50* 49* 53*  CREATININE 1.41* 1.37* 1.36*  CALCIUM 8.6 8.8 8.9  GLUCOSE 92 83 89    CBG (last 3)  No results for input(s): GLUCAP in the last 72 hours.  Scheduled Meds: . antiseptic oral rinse  7 mL Mouth Rinse QID  . antiseptic oral rinse  7 mL Mouth Rinse QID  . atorvastatin  80 mg Per Tube Daily  . chlorhexidine  15 mL Mouth Rinse BID  . feeding supplement (VITAL HIGH PROTEIN)  1,000 mL Per Tube Q24H  . folic acid  1 mg Per Tube Daily  . furosemide  40 mg Intravenous Q6H  . ipratropium-albuterol  3 mL Nebulization Q6H  . lidocaine   Topical Once  . multivitamin with minerals  1 tablet Per Tube Daily  . oxymetazoline  1 spray Each Nare Once  . pantoprazole (PROTONIX) IV  40 mg Intravenous Daily  . piperacillin-tazobactam (ZOSYN)  IV  3.375 g Intravenous 3 times per day  . sodium chloride  3 mL Intravenous Q12H  . thiamine  100 mg Per Tube Daily  . vancomycin (VANCOCIN) 750 mg IVPB  750 mg Intravenous Q12H    Continuous Infusions: . fentaNYL infusion INTRAVENOUS 50 mcg/hr (05/15/14 0800)  . propofol 14.96 mcg/kg/min (05/15/14 1100)    Atlee Abide MS RD LDN Clinical Dietitian [phone]

## 2014-05-16 ENCOUNTER — Inpatient Hospital Stay (HOSPITAL_COMMUNITY): Payer: Medicare Other

## 2014-05-16 DIAGNOSIS — I481 Persistent atrial fibrillation: Secondary | ICD-10-CM

## 2014-05-16 DIAGNOSIS — N179 Acute kidney failure, unspecified: Secondary | ICD-10-CM

## 2014-05-16 DIAGNOSIS — N183 Chronic kidney disease, stage 3 (moderate): Secondary | ICD-10-CM

## 2014-05-16 DIAGNOSIS — I5033 Acute on chronic diastolic (congestive) heart failure: Secondary | ICD-10-CM

## 2014-05-16 DIAGNOSIS — E785 Hyperlipidemia, unspecified: Secondary | ICD-10-CM

## 2014-05-16 LAB — CBC
HCT: 29.4 % — ABNORMAL LOW (ref 39.0–52.0)
Hemoglobin: 9 g/dL — ABNORMAL LOW (ref 13.0–17.0)
MCH: 27.9 pg (ref 26.0–34.0)
MCHC: 30.6 g/dL (ref 30.0–36.0)
MCV: 91 fL (ref 78.0–100.0)
Platelets: 197 10*3/uL (ref 150–400)
RBC: 3.23 MIL/uL — ABNORMAL LOW (ref 4.22–5.81)
RDW: 17.6 % — AB (ref 11.5–15.5)
WBC: 8.3 10*3/uL (ref 4.0–10.5)

## 2014-05-16 LAB — GLUCOSE, CAPILLARY
GLUCOSE-CAPILLARY: 118 mg/dL — AB (ref 70–99)
GLUCOSE-CAPILLARY: 125 mg/dL — AB (ref 70–99)
Glucose-Capillary: 129 mg/dL — ABNORMAL HIGH (ref 70–99)
Glucose-Capillary: 119 mg/dL — ABNORMAL HIGH (ref 70–99)
Glucose-Capillary: 124 mg/dL — ABNORMAL HIGH (ref 70–99)
Glucose-Capillary: 133 mg/dL — ABNORMAL HIGH (ref 70–99)

## 2014-05-16 LAB — PHOSPHORUS: Phosphorus: 3.6 mg/dL (ref 2.3–4.6)

## 2014-05-16 LAB — PROCALCITONIN: PROCALCITONIN: 0.34 ng/mL

## 2014-05-16 LAB — BASIC METABOLIC PANEL
ANION GAP: 8 (ref 5–15)
BUN: 60 mg/dL — ABNORMAL HIGH (ref 6–23)
CO2: 32 mEq/L (ref 19–32)
CREATININE: 2 mg/dL — AB (ref 0.50–1.35)
Calcium: 8.6 mg/dL (ref 8.4–10.5)
Chloride: 93 mEq/L — ABNORMAL LOW (ref 96–112)
GFR calc non Af Amer: 33 mL/min — ABNORMAL LOW (ref >90)
GFR, EST AFRICAN AMERICAN: 38 mL/min — AB (ref >90)
Glucose, Bld: 118 mg/dL — ABNORMAL HIGH (ref 70–99)
POTASSIUM: 4.1 meq/L (ref 3.7–5.3)
Sodium: 133 mEq/L — ABNORMAL LOW (ref 137–147)

## 2014-05-16 LAB — MAGNESIUM: Magnesium: 2.2 mg/dL (ref 1.5–2.5)

## 2014-05-16 LAB — PROTIME-INR
INR: 2.47 — ABNORMAL HIGH (ref 0.00–1.49)
Prothrombin Time: 26.9 seconds — ABNORMAL HIGH (ref 11.6–15.2)

## 2014-05-16 MED ORDER — VITAL HIGH PROTEIN PO LIQD
1000.0000 mL | ORAL | Status: DC
  Administered 2014-05-16 – 2014-05-19 (×6): 1000 mL
  Administered 2014-05-20: 20:00:00
  Administered 2014-05-20: 1000 mL
  Administered 2014-05-20 – 2014-05-21 (×5)
  Administered 2014-05-21: 1000 mL
  Administered 2014-05-21 (×4)
  Administered 2014-05-21 – 2014-05-25 (×6): 1000 mL
  Filled 2014-05-16 (×15): qty 1000

## 2014-05-16 MED ORDER — CAMPHOR-MENTHOL 0.5-0.5 % EX LOTN
TOPICAL_LOTION | CUTANEOUS | Status: DC | PRN
  Filled 2014-05-16: qty 222

## 2014-05-16 MED ORDER — ADULT MULTIVITAMIN LIQUID CH
5.0000 mL | Freq: Every day | ORAL | Status: DC
  Administered 2014-05-17 – 2014-05-26 (×10): 5 mL
  Filled 2014-05-16 (×10): qty 5

## 2014-05-16 MED ORDER — METOPROLOL TARTRATE 1 MG/ML IV SOLN
5.0000 mg | Freq: Four times a day (QID) | INTRAVENOUS | Status: DC
  Administered 2014-05-16 – 2014-05-17 (×4): 5 mg via INTRAVENOUS
  Filled 2014-05-16 (×4): qty 5

## 2014-05-16 NOTE — Progress Notes (Signed)
ANTIBIOTIC CONSULT NOTE  Pharmacy Consult for Zosyn and Vancomycin Indication: PNA  No Known Allergies  Patient Measurements: Height: 5\' 6"  (167.6 cm) Weight: 154 lb 15.7 oz (70.3 kg) IBW/kg (Calculated) : 63.8   Vital Signs: Temp: 99.1 F (37.3 C) (11/11 0913) Temp Source: Core (Comment) (11/11 0800) BP: 104/51 mmHg (11/11 0800) Pulse Rate: 128 (11/11 0913) Intake/Output from previous day: 11/10 0701 - 11/11 0700 In: 1834.2 [I.V.:584.2; NG/GT:520; IV Piggyback:600] Out: 476 [Urine:461; Emesis/NG output:15] Intake/Output from this shift: Total I/O In: 155.7 [I.V.:55.7; NG/GT:100] Out: 40 [Urine:40]  Labs:  Recent Labs  05/14/14 0358 05/15/14 0515 05/15/14 0950 05/16/14 0338  WBC 6.6 9.1  --  8.3  HGB 10.5* 10.0*  --  9.0*  PLT 258 243  --  197  CREATININE 1.37*  --  1.36* 2.00*   Estimated Creatinine Clearance: 31.9 mL/min (by C-G formula based on Cr of 2). No results for input(s): VANCOTROUGH, VANCOPEAK, VANCORANDOM, GENTTROUGH, GENTPEAK, GENTRANDOM, TOBRATROUGH, TOBRAPEAK, TOBRARND, AMIKACINPEAK, AMIKACINTROU, AMIKACIN in the last 72 hours.   Microbiology: Recent Results (from the past 720 hour(s))  MRSA PCR Screening     Status: None   Collection Time: 05/15/14  7:39 AM  Result Value Ref Range Status   MRSA by PCR NEGATIVE NEGATIVE Final    Comment:        The GeneXpert MRSA Assay (FDA approved for NASAL specimens only), is one component of a comprehensive MRSA colonization surveillance program. It is not intended to diagnose MRSA infection nor to guide or monitor treatment for MRSA infections.   Culture, respiratory (NON-Expectorated)     Status: None (Preliminary result)   Collection Time: 05/15/14  4:30 PM  Result Value Ref Range Status   Specimen Description TRACHEAL ASPIRATE  Final   Special Requests NONE  Final   Gram Stain   Final    MODERATE WBC PRESENT, PREDOMINANTLY PMN RARE SQUAMOUS EPITHELIAL CELLS PRESENT NO ORGANISMS  SEEN Performed at [facility]    Culture   Final    NO GROWTH 1 DAY Performed at [facility]    Report Status PENDING  Incomplete    Medical History: Past Medical History  Diagnosis Date  . Essential hypertension, benign   . Coronary atherosclerosis of native coronary artery     a. CABG/MVR/AVR-2003 (#25 St. Jude/#21 St. Jude); anticoagulation; b. negative stress nuclear-2005;  c. 01/2013 NSTEMI/Cath/PCI: LM nl, LAD 50p, 40-53m, D1 50ost, LCX 69m, RCA 50/40m (2.5x20 Promus DES), 50d, VG->Diag 100, VG->PDA 100, VG->OM 100, LIMA->LAD ok.  . Epistaxis     Requiring cautery & aterial ligation-09/2009  . GERD (gastroesophageal reflux disease)   . Hyperlipemia   . Abnormal LFTs     Possible cirrhosis  . Chronic anticoagulation   . Valvular heart disease     a. 2003: MVR/AVR-2003 (#25 St. Jude/#21 St. Jude);  b. 01/2013 Echo: EF 55%, Mech AVR mean grad 12, Mech MVR mean grad 6.  . Tobacco abuse     45 pack years  . Fasting hyperglycemia   . Nephrolithiasis   . Diverticulosis   . Hyponatremia   . Chronic diastolic CHF (congestive heart failure)   . Chronic renal disease   . Hemolytic anemia   . ETOH abuse   . Atrial fibrillation 01/16/2013    On coumadin DCCV 07/2013.   Marland Kitchen COPD (chronic obstructive pulmonary disease)   . Stage III chronic kidney disease     Medications:  Scheduled:  . antiseptic oral rinse  7 mL  Mouth Rinse QID  . antiseptic oral rinse  7 mL Mouth Rinse QID  . atorvastatin  80 mg Per Tube Daily  . chlorhexidine  15 mL Mouth Rinse BID  . feeding supplement (VITAL HIGH PROTEIN)  1,000 mL Per Tube Q24H  . folic acid  1 mg Per Tube Daily  . ipratropium-albuterol  3 mL Nebulization Q6H  . lidocaine   Topical Once  . multivitamin with minerals  1 tablet Per Tube Daily  . oxymetazoline  1 spray Each Nare Once  . pantoprazole (PROTONIX) IV  40 mg Intravenous Daily  . piperacillin-tazobactam (ZOSYN)  IV  3.375 g Intravenous 3 times per day  . sodium  chloride  3 mL Intravenous Q12H  . thiamine  100 mg Per Tube Daily  . vancomycin (VANCOCIN) 750 mg IVPB  750 mg Intravenous Q12H   Infusions:  . fentaNYL infusion INTRAVENOUS 75 mcg/hr (05/16/14 0909)  . propofol 5 mcg/kg/min (05/16/14 5176)   Assessment: 68 y.o. M brought to [facility] 11/8 with epistaxis. Had rhinopack placed with temporary cessation of bleeding. Seen by ENT 11/9 and had surgicel packing placed with apparent cessation of bleeding. In early AM hours 11/10, developed worsening dyspnea, transferred to the ICU and intubated.Pharmacy consulted to dose Vancomycin and Zosyn for possible aspiration pneumonia.  11/10 >>zosyn >> 11/10 >>vancomycin >>   Tmax: AF WBCs: WNL Renal: 1.37 -> 2 CrCl ~13ml/min PCT: 0.34 (11/11)  11/10 sputum: ngtd  Goal of Therapy:  Vancomycin trough level 15-20 mcg/ml  Appropriate antibiotic dosing for renal function; eradication of infection  Plan:   Continue Zosyn 3.375 Gm IV q8h EI  Continue Vancomycin 750mg  IV q12h  Obtain VT prior to dose tonight  F/U SCr/levels/cultures as needed  Kizzie Furnish, PharmD Pager: [phone] 05/16/2014 9:19 AM

## 2014-05-16 NOTE — Progress Notes (Signed)
Nutrition Note  Intervention: -Due to decrease in propofol, recommend to increase Vital High Protein to new goal rate of 60 ml/hr -Tube feeding regimen + propofol (114 kcal) provides 1554 kcal (96% of needs), 125 grams of protein, and 1203 ml of H2O.   Re-estimated needs:  Kcal: 1616 Protein: 95-105 gram Fluid: 1.8 L/day  Following per protocols. Please re-consult as needed. Atlee Abide MS RD LDN Clinical Dietitian [phone]

## 2014-05-16 NOTE — Progress Notes (Signed)
[facility] Progress Note Patient Name: [name] DOB: 1945-09-11 MRN: [id number]   Date of Service  05/16/2014  HPI/Events of Note  afib rvr, on metop at home, none here HD stable  eICU Interventions  Metoprolol IV scheduled     Intervention Category Major Interventions: Arrhythmia - evaluation and management  Paulita Licklider 05/16/2014, 4:48 PM

## 2014-05-16 NOTE — Progress Notes (Signed)
PULMONARY / CRITICAL CARE MEDICINE   Name: [name] MRN: [id number] DOB: [birth year]    ADMISSION DATE:  05/25/2014 CONSULTATION DATE:  05/16/2014  REFERRING MD :  Rama  CHIEF COMPLAINT:  Respiratory failure in setting epistaxis  INITIAL PRESENTATION:  68 y.o. M brought to [facility] 11/8 with epistaxis.  Had rhinopack placed with temporary cessation of bleeding.  Seen by ENT 11/9 and had surgicel packing placed with apparent cessation of bleeding.  In early AM hours 11/10, developed worsening dyspnea despite NRB mask.  He was transferred to the ICU and intubated by CRNA for resp acidosis.    PMH - chronic 2L O2, frequent nosebleeds due to being on warfarin and plavix for hx of 2 mechanical heart valves.    STUDIES:  None  SIGNIFICANT EVENTS: 11/8 - admitted 11/9 - ENT consult 11/10 - transferred to ICU, intubated   SUBJECTIVE: sedated, remains on PEEP 8 Afebrile no more bleeding Low UO  VITAL SIGNS: Temp:  [96.6 F (35.9 C)-99.1 F (37.3 C)] 99.1 F (37.3 C) (11/11 0913) Pulse Rate:  [29-128] 128 (11/11 0913) Resp:  [19-31] 25 (11/11 0913) BP: (71-130)/(29-73) 104/51 mmHg (11/11 0800) SpO2:  [76 %-99 %] 87 % (11/11 0913) FiO2 (%):  [50 %-70 %] 50 % (11/11 0800) Weight:  [154 lb 15.7 oz (70.3 kg)] 154 lb 15.7 oz (70.3 kg) (11/11 0400) HEMODYNAMICS:   VENTILATOR SETTINGS: Vent Mode:  [-] PRVC FiO2 (%):  [50 %-70 %] 50 % Set Rate:  [24 bmp] 24 bmp Vt Set:  [380 mL] 380 mL PEEP:  [8 cmH20-12 cmH20] 8 cmH20 Plateau Pressure:  [15 cmH20-19 cmH20] 16 cmH20 INTAKE / OUTPUT: Intake/Output      11/10 0701 - 11/11 0700 11/11 0701 - 11/12 0700   I.V. (mL/kg) 584.2 (8.3) 55.7 (0.8)   Other 130    NG/GT 520 100   IV Piggyback 600    Total Intake(mL/kg) 1834.2 (26.1) 155.7 (2.2)   Urine (mL/kg/hr) 461 (0.3) 40 (0.2)   Emesis/NG output 15 (0)    Total Output 476 40   Net +1358.2 +115.7          PHYSICAL EXAMINATION: General: Elderly male, chronically ill appearing,  Sedated, in NAD. Neuro: Sedated on vent. HEENT: [place]/AT. PERRL, sclerae anicteric.  B/l nares packed, dried blood noted. Cardiovascular: IRIR, SEM with click noted.  Lungs: Respirations even and unlabored.  CTA bilaterally, No W/R/R. Abdomen: BS x 4, soft, NT/ND.  Musculoskeletal: No gross deformities, no edema.  Skin: Multiple small skin excoriations with scabs, multiple bruises, warm, no rashes.  LABS:  CBC  Recent Labs Lab 05/14/14 0358 05/15/14 0515 05/16/14 0338  WBC 6.6 9.1 8.3  HGB 10.5* 10.0* 9.0*  HCT 34.5* 34.1* 29.4*  PLT 258 243 197   Coag's  Recent Labs Lab 05/27/2014 2201 05/14/14 0358 05/16/14 0338  INR 2.28* 2.17* 2.47*   BMET  Recent Labs Lab 05/14/14 0358 05/15/14 0950 05/16/14 0338  NA 133* 136* 133*  K 4.7 4.8 4.1  CL 92* 95* 93*  CO2 29 29 32  BUN 49* 53* 60*  CREATININE 1.37* 1.36* 2.00*  GLUCOSE 83 89 118*   Electrolytes  Recent Labs Lab 05/14/14 0358 05/15/14 0950 05/16/14 0338  CALCIUM 8.8 8.9 8.6  MG  --   --  2.2  PHOS  --   --  3.6   Sepsis Markers  Recent Labs Lab 05/15/14 0515 05/16/14 0348  PROCALCITON <0.10 0.34   ABG  Recent Labs Lab 05/15/14  0528 05/15/14 0900 05/15/14 1728  PHART 7.289* 7.427 7.406  PCO2ART 66.2* 49.0* 52.1*  PO2ART 58.5* 55.5* 55.4*   Liver Enzymes No results for input(s): AST, ALT, ALKPHOS, BILITOT, ALBUMIN in the last 168 hours. Cardiac Enzymes  Recent Labs Lab 05/12/2014 2157  TROPONINI <0.30  PROBNP 13471.0*   Glucose  Recent Labs Lab 05/15/14 1631 05/15/14 1951 05/16/14 0001 05/16/14 0401 05/16/14 0736  GLUCAP 93 122* 129* 119* 124*    Imaging Dg Abd 1 View  05/15/2014   CLINICAL DATA:  Orogastric tube placement  EXAM: ABDOMEN - 1 VIEW  COMPARISON:  02/06/2014  FINDINGS: Orogastric tube tip at the proximal stomach. Advancement by 6 cm would provide more stable positioning.  The bowel gas pattern is nonobstructive. Multiple skin or bedding folds are noted over the  abdomen. The visualized bowel gas pattern is nonobstructed. Extensive arterial calcification again noted.  IMPRESSION: Orogastric tube tip at the proximal stomach. Advancement by approximately 6 cm would provide more secure positioning.   Electronically Signed   By: Jorje Guild M.D.   On: 05/15/2014 10:22   Dg Chest Port 1 View  05/16/2014   CLINICAL DATA:  Hypoxia  EXAM: PORTABLE CHEST - 1 VIEW  COMPARISON:  May 15, 2014  FINDINGS: Endotracheal tube tip is 2.7 cm above carina. Central catheter tip is in the superior cava near cavoatrial junction. Nasogastric tube tip and side port are below the diaphragm in stomach. No pneumothorax. There remains generalized interstitial edema with patchy atelectatic change in the bases, more on the left than on the right. There is stable cardiomegaly. The pulmonary vascularity is within normal limits. No adenopathy. Patient is status post coronary artery bypass grafting and valve replacements.  IMPRESSION: Findings consistent with congestive heart failure. Left base atelectasis present. Tube and catheter positions as described without pneumothorax.   Electronically Signed   By: Lowella Grip M.D.   On: 05/16/2014 07:03   Dg Chest Port 1 View  05/15/2014   CLINICAL DATA:  Central line placement.  EXAM: PORTABLE CHEST - 1 VIEW  COMPARISON:  Single view of the chest 05/15/2014 at 4:46 a.m.  FINDINGS: New right IJ catheter is in place with the tip projecting over the lower superior vena cava. No pneumothorax is identified. Endotracheal tube is again noted. Extensive bilateral pulmonary edema and small pleural effusions are unchanged.  IMPRESSION: Right IJ catheter tip projects over the lower superior vena cava. Negative for pneumothorax.  No change in bilateral airspace disease most consistent with pulmonary edema.   Electronically Signed   By: Inge Rise M.D.   On: 05/15/2014 06:40   Portable Chest Xray  05/15/2014   CLINICAL DATA:  Status post  intubation.  EXAM: PORTABLE CHEST - 1 VIEW  COMPARISON:  Single view of the chest 05/15/2014 at 4:04 a.m.  FINDINGS: New endotracheal tube is in place with the tip in good position just below the clavicular heads. Cardiomegaly, pulmonary edema and pleural effusions persist without change. No pneumothorax.  IMPRESSION: ET tube projects in good position.  No change in pulmonary edema and pleural effusions.   Electronically Signed   By: Inge Rise M.D.   On: 05/15/2014 05:13   Dg Chest Port 1 View  05/15/2014   CLINICAL DATA:  Hypoxia.  Decreased oxygen saturation.  EXAM: PORTABLE CHEST - 1 VIEW  COMPARISON:  Single view of the chest 05/25/2014 02/08/2014.  FINDINGS: There is extensive bilateral airspace disease and right greater than left pleural effusions. Cardiomegaly is noted.  No pneumothorax identified.  IMPRESSION: Findings most consistent with congestive heart failure with associated right greater than left pleural effusions.   Electronically Signed   By: Inge Rise M.D.   On: 05/15/2014 04:16     ASSESSMENT / PLAN:  PULMONARY OETT 11/10 >>> A: Acute on chronic hypoxic respiratory failure ARDS COPD without evidence of exacerbation Likely blood aspiration rather than Pulmonary edema Small b/l pleural effusions Tobacco use  P:   Full mechanical support. ARDS protocol -lower PEEP to 8 VAP bundle. SBT when hypoxia improved DuoNebs / Albuterol. Tobacco cessation.   CARDIOVASCULAR A:  A.fib rate controlled Hx mechanical MVR and AVR in 2003 Chronic anticoagulation due to above Hx dCHF, HTN, HLD P:  PO antihypertensives / diuretics discontinued for now. Coumadin / Plavix currently on hold -does he need plavix given recurrent nose bleeds? Will need IV heparin once INR 1.5 or lower - (warfarin and plavix have been held since admit.  INR currently 2.5).   RENAL A:  AKI ? Diuretic induced CKD - stable Hyponatremia P:   NS @ KVO. BMP in AM. Hold  lasix   GASTROINTESTINAL A:   GERD Nutrition P:   Pantoprazole. NPO. TF   HEMATOLOGIC A:   Chronic anemia On chronic anticoagulation VTE Prophylaxis P:  Transfuse for Hgb < 7. SCD's only. Daily INR - Needs anticoagulation for mechanical valves but will need to discuss with ENT (warfarin and plavix have been held since admit). CBC and coags in AM.  INFECTIOUS A:   Concern for aspiration PNA P:   Sputum Cx 11/10 >>>ng  Abx: Vanc, start date 11/10 >> 11/11 Abx: Zosyn, start date 11/10,  Dc vanc  ENDOCRINE A:   No known issues P:   Monitor glucose on BMP.  NEUROLOGIC A:   Acute metabolic encephalopathy ETOH abuse P:   Sedation:  Propofol gtt / Fentanyl PRN. RASS goal: 0 to -1. Daily WUA. Thiamine / Folate / Multivitamin.  Family updated: s-in law HCPOA - updated 11/10  Interdisciplinary Family Meeting v Palliative Care Meeting:  Due by: 11/17.  Summary - ARDS range hypoxia probably due to aspirated blood with severe underlying COPD & autoPEEP, expect to improve , will need Cards input re" need for plavix +coumadin with recurrent life threatening nose bleeds  The patient is critically ill with multiple organ systems failure and requires high complexity decision making for assessment and support, frequent evaluation and titration of therapies, application of advanced monitoring technologies and extensive interpretation of multiple databases. Critical Care Time devoted to patient care services described in this note is 35 minutes.   Kara Mead MD. Shade Flood. [facility] Pager [phone] If no response call [phone]     05/16/2014, 9:34 AM  Rigoberto Noel, MD

## 2014-05-16 NOTE — Consult Note (Signed)
CARDIOLOGY CONSULT NOTE   Patient ID: [name] MRN: [id number], DOB/AGE: 69-17-1947   Admit date: 05/06/2014 Date of Consult: 05/16/2014  Primary Physician: Alonza Bogus, MD Primary Cardiologist: Dr. Carlyle Dolly  Reason for consult:  Anticoagulation, nosebleed, history of 2 mechanical heart valves.  Problem List  Past Medical History  Diagnosis Date  . Essential hypertension, benign   . Coronary atherosclerosis of native coronary artery     a. CABG/MVR/AVR-2003 (#25 St. Jude/#21 St. Jude); anticoagulation; b. negative stress nuclear-2005;  c. 01/2013 NSTEMI/Cath/PCI: LM nl, LAD 50p, 40-29m, D1 50ost, LCX 16m, RCA 50/44m (2.5x20 Promus DES), 50d, VG->Diag 100, VG->PDA 100, VG->OM 100, LIMA->LAD ok.  . Epistaxis     Requiring cautery & aterial ligation-09/2009  . GERD (gastroesophageal reflux disease)   . Hyperlipemia   . Abnormal LFTs     Possible cirrhosis  . Chronic anticoagulation   . Valvular heart disease     a. 2003: MVR/AVR-2003 (#25 St. Jude/#21 St. Jude);  b. 01/2013 Echo: EF 55%, Mech AVR mean grad 12, Mech MVR mean grad 6.  . Tobacco abuse     45 pack years  . Fasting hyperglycemia   . Nephrolithiasis   . Diverticulosis   . Hyponatremia   . Chronic diastolic CHF (congestive heart failure)   . Chronic renal disease   . Hemolytic anemia   . ETOH abuse   . Atrial fibrillation 01/16/2013    On coumadin DCCV 07/2013.   Marland Kitchen COPD (chronic obstructive pulmonary disease)   . Stage III chronic kidney disease     Past Surgical History  Procedure Laterality Date  . Endocopic sphenopalatine artery ligation & cautry    . Inguinal hernia repair      Left & right  . Coronary artery bypass graft  11/2001    [facility]  . Cardiac valve replacement  11/2001    AVR and MVR-St. Jude devices  . Cataract extraction w/phaco  01/20/2011    Procedure: CATARACT EXTRACTION PHACO AND INTRAOCULAR LENS PLACEMENT (IOC);  Surgeon: Elta Guadeloupe T. Gershon Crane;  Location: [facility];   Service: Ophthalmology;  Laterality: Right;  CDE: 8.51  . Cataract extraction w/phaco  02/03/2011    Procedure: CATARACT EXTRACTION PHACO AND INTRAOCULAR LENS PLACEMENT (IOC);  Surgeon: Elta Guadeloupe T. Gershon Crane;  Location: [facility];  Service: Ophthalmology;  Laterality: Left;  CDE:10.01  . Colonoscopy  04/05/02; 08/2011    friable anal canal hemorrhoids otherwise normal; 2 diminutive polyps excised, minimal diverticulosis noted  . Esophagogastroduodenoscopy  01/2002    Dr. Laural Golden, submucosal esophageal lesion c/w leiomyoma  . Colonoscopy  09/02/2011    Procedure: COLONOSCOPY;  Surgeon: Daneil Dolin, MD;  Location: [facility];  Service: Endoscopy;  Laterality: N/A;  8:15  . Yag laser application Right 18/29/9371    Procedure: YAG LASER APPLICATION;  Surgeon: Elta Guadeloupe T. Gershon Crane, MD;  Location: [facility];  Service: Ophthalmology;  Laterality: Right;     Allergies  No Known Allergies  HPI  68 year old gentleman, patient of Dr. Harl Bowie who has been following him for diastolic heart failure, coronary artery disease status post AVR and MVR with coronary artery bypass grafting in 2005, chronic atrial fibrillation and hyperlipidemia. The patient was seen by Dr. Harl Bowie in September of 2 this year after hospitalization for acute on chronic diastolic heart failure. At the time of visit he was still mildly volume overloaded and was treated. The patient is on chronic anticoagulation with Coumadin.he was admitted on November 8 with epistaxis. Had rhinopack  placed with temporary cessation of bleeding. Seen by ENT 11/9 and had surgicel packing placed with apparent cessation of bleeding. In early AM hours 11/10, developed worsening dyspnea despite NRB mask. He was transferred to the ICU and intubated by CRNA for resp acidosis.He has significant history of prior frequent nosebleeds  Due to chronic anticoagulation and Plavix. He is currently on no anticoagulation or antiplatelet therapy. He is intubated and sedated with fentanyl  and propofol.  Inpatient Medications  . antiseptic oral rinse  7 mL Mouth Rinse QID  . atorvastatin  80 mg Per Tube Daily  . chlorhexidine  15 mL Mouth Rinse BID  . feeding supplement (VITAL HIGH PROTEIN)  1,000 mL Per Tube Q24H  . folic acid  1 mg Per Tube Daily  . ipratropium-albuterol  3 mL Nebulization Q6H  . lidocaine   Topical Once  . [START ON 05/17/2014] multivitamin  5 mL Per Tube Daily  . oxymetazoline  1 spray Each Nare Once  . pantoprazole (PROTONIX) IV  40 mg Intravenous Daily  . piperacillin-tazobactam (ZOSYN)  IV  3.375 g Intravenous 3 times per day  . sodium chloride  3 mL Intravenous Q12H  . thiamine  100 mg Per Tube Daily    Family History Family History  Problem Relation Age of Onset  . Hypotension Neg Hx   . Anesthesia problems Neg Hx   . Malignant hyperthermia Neg Hx   . Pseudochol deficiency Neg Hx   . Colon cancer Neg Hx   . Liver disease Neg Hx      Social History History   Social History  . Marital Status: Widowed    Spouse Name: N/A    Number of Children: 1  . Years of Education: N/A   Occupational History  . Retired    Social History Main Topics  . Smoking status: Former Smoker -- 0.25 packs/day for 52 years    Types: Cigarettes    Quit date: 01/03/2014  . Smokeless tobacco: Former Systems developer    Quit date: 12/04/2013     Comment: STATES HE IS CUTTING DOWN now only 1/2 ppd (01/27/2013)  . Alcohol Use: 4.2 oz/week    6 Cans of beer, 1 Shots of liquor per week     Comment: Daily  . Drug Use: No  . Sexual Activity: No   Other Topics Concern  . Not on file   Social History Narrative   Lives in [place] by himself.  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     Review of Systems  General:  No chills, fever, night sweats or weight changes.  Cardiovascular:  No chest pain, dyspnea on exertion, edema, orthopnea, palpitations, paroxysmal nocturnal dyspnea. Dermatological: No rash, lesions/masses Respiratory: No cough,  dyspnea Urologic: No hematuria, dysuria Abdominal:   No nausea, vomiting, diarrhea, bright red blood per rectum, melena, or hematemesis Neurologic:  No visual changes, wkns, changes in mental status. All other systems reviewed and are otherwise negative except as noted above.  Physical Exam  Blood pressure 104/57, pulse 114, temperature 99.1 F (37.3 C), temperature source Core (Comment), resp. rate 24, height 5\' 6"  (1.676 m), weight 154 lb 15.7 oz (70.3 kg), SpO2 92 %.  General: intubated sedated Neck: Supple without bruits or JVD. Lungs:  Resp regular and unlabored, rales at the basis Abdomen: non-distended, BS + x 4.  Extremities: No clubbing, cyanosis or edema. DP/PT/Radials 2+ and equal bilaterally.  Labs   Recent Labs  05/08/2014 2157  TROPONINI <0.30   Lab Results  Component Value Date   WBC 8.3 05/16/2014   HGB 9.0* 05/16/2014   HCT 29.4* 05/16/2014   MCV 91.0 05/16/2014   PLT 197 05/16/2014    Recent Labs Lab 05/16/14 0338  NA 133*  K 4.1  CL 93*  CO2 32  BUN 60*  CREATININE 2.00*  CALCIUM 8.6  GLUCOSE 118*   Lab Results  Component Value Date   CHOL 147 01/28/2013   HDL 64 01/28/2013   LDLCALC 74 01/28/2013   TRIG 85 05/15/2014   No results found for: DDIMER Invalid input(s): POCBNP  Radiology/Studies  Chest Port 1 View  05/16/2014   CLINICAL DATA:  Hypoxia  EXAM: PORTABLE CHEST - 1 VIEW  COMPARISON:  May 15, 2014  FINDINGS: Endotracheal tube tip is 2.7 cm above carina. Central catheter tip is in the superior cava near cavoatrial junction. Nasogastric tube tip and side port are below the diaphragm in stomach. No pneumothorax. There remains generalized interstitial edema with patchy atelectatic change in the bases, more on the left than on the right. There is stable cardiomegaly. The pulmonary vascularity is within normal limits. No adenopathy. Patient is status post coronary artery bypass grafting and valve replacements.  IMPRESSION: Findings  consistent with congestive heart failure. Left base atelectasis present. Tube and catheter positions as described without pneumothorax.     Echocardiogram:07/24/2013 Left ventricle: The cavity size was normal. Wall thickness was normal. Systolic function was normal. The estimated ejection fraction was in the range of 50% to 55%. - Aortic valve: Normal appearing mechanical AVR - Mitral valve: Normal appearing mechanical MVR Still with extensive MAC Valve area by pressure half-time: 2.47cm^2. - Left atrium: Moderately dilated. Some shadowing from MVR tech measurements are off - Atrial septum: No defect or patent foramen ovale was identified.  Telemetry: atrial fibrillation with rapid ventricular response heart rate between 105-140 beats per minutes.    ASSESSMENT AND PLAN   Principal Problem:   Epistaxis, recurrent Active Problems:   Dyslipidemia   HYPONATREMIA, CHRONIC   COPD (chronic obstructive pulmonary disease)   MITRAL VALVE REPLACEMENT, HX OF   S/P AVR   Chronic anticoagulation   Coronary atherosclerosis   Hypertension   Acute-on-chronic respiratory failure   Epistaxis   CHF (congestive heart failure)   Stage III chronic kidney disease   Malnutrition of moderate degree   Acute respiratory failure   Altered mental status   1. Acute on chronic diastolic heart failure  - today's weight 154 pounds this is 3 pounds down from the admission. He was 154 pounds on the last office visit in September. - he is mildly fluid overloaded on physical exam and chest x-ray shows pulmonary edema. Considering his acute on chronic kidney failure I would give just very low dose of Lasix 20 mg 1 followed the response and creatinine and decide about further therapy based on that. Crea 1.36-> 2.0  2 Valvular heart disease  - prior MVR and AVR  - recent echo with normal functioning prosthetic valves  - echo revealed discontinuation of Coumadin under the circumstances of  significant bleeding and requirement of mechanical ventilation. However we are risking valve thrombosis especially mitral valve is very prone to thrombosis without anticoagulation and therefore I would recommend to start heparin drip that can be stopped in case of continuous bleeding. Hold aspirin.   3 CAD  - no recent chest pain  - recent MPI intermediate risk some areas of ischemia in the lateral wall  and inferior wall.  - continue medical management  - hold plavix and ASA for now   4 Afib with RVR - start metoprolol 5 mg IV every 6 hours  5. Acute on chronic kidney failure - Cr bumped to 2 , baseline creatinine 1.3, do very low dose of Lasix and continue monitor creatinine    Signed, Dorothy Spark, MD, [facility] 05/16/2014, 11:28 AM

## 2014-05-16 NOTE — Plan of Care (Signed)
Problem: Phase I Progression Outcomes Goal: HOB elevated 30 degrees Outcome: Completed/Met Date Met:  05/16/14 Goal: VAP prevention protocol initiated Outcome: Completed/Met Date Met:  05/16/14 Goal: ARDS Protocol initiated if indicated Outcome: Progressing

## 2014-05-17 ENCOUNTER — Inpatient Hospital Stay (HOSPITAL_COMMUNITY): Payer: Medicare Other

## 2014-05-17 DIAGNOSIS — I5031 Acute diastolic (congestive) heart failure: Secondary | ICD-10-CM

## 2014-05-17 DIAGNOSIS — I509 Heart failure, unspecified: Secondary | ICD-10-CM | POA: Insufficient documentation

## 2014-05-17 LAB — PROCALCITONIN: Procalcitonin: 0.34 ng/mL

## 2014-05-17 LAB — BASIC METABOLIC PANEL
ANION GAP: 12 (ref 5–15)
BUN: 74 mg/dL — ABNORMAL HIGH (ref 6–23)
CHLORIDE: 96 meq/L (ref 96–112)
CO2: 31 mEq/L (ref 19–32)
Calcium: 8.4 mg/dL (ref 8.4–10.5)
Creatinine, Ser: 2.23 mg/dL — ABNORMAL HIGH (ref 0.50–1.35)
GFR calc Af Amer: 33 mL/min — ABNORMAL LOW (ref >90)
GFR calc non Af Amer: 29 mL/min — ABNORMAL LOW (ref >90)
Glucose, Bld: 123 mg/dL — ABNORMAL HIGH (ref 70–99)
Potassium: 4.1 mEq/L (ref 3.7–5.3)
SODIUM: 139 meq/L (ref 137–147)

## 2014-05-17 LAB — BLOOD GAS, ARTERIAL
Acid-Base Excess: 4.9 mmol/L — ABNORMAL HIGH (ref 0.0–2.0)
Bicarbonate: 30 mEq/L — ABNORMAL HIGH (ref 20.0–24.0)
Drawn by: 232811
FIO2: 0.5 %
MECHVT: 380 mL
O2 Saturation: 91.8 %
PCO2 ART: 51.2 mmHg — AB (ref 35.0–45.0)
PEEP/CPAP: 8 cmH2O
Patient temperature: 99
RATE: 24 resp/min
TCO2: 28.4 mmol/L (ref 0–100)
pH, Arterial: 7.387 (ref 7.350–7.450)
pO2, Arterial: 66.7 mmHg — ABNORMAL LOW (ref 80.0–100.0)

## 2014-05-17 LAB — PROTIME-INR
INR: 1.99 — AB (ref 0.00–1.49)
Prothrombin Time: 22.8 seconds — ABNORMAL HIGH (ref 11.6–15.2)

## 2014-05-17 LAB — GLUCOSE, CAPILLARY
GLUCOSE-CAPILLARY: 128 mg/dL — AB (ref 70–99)
GLUCOSE-CAPILLARY: 102 mg/dL — AB (ref 70–99)
GLUCOSE-CAPILLARY: 132 mg/dL — AB (ref 70–99)
GLUCOSE-CAPILLARY: 134 mg/dL — AB (ref 70–99)
Glucose-Capillary: 115 mg/dL — ABNORMAL HIGH (ref 70–99)

## 2014-05-17 LAB — CULTURE, RESPIRATORY W GRAM STAIN

## 2014-05-17 LAB — CBC
HCT: 27.9 % — ABNORMAL LOW (ref 39.0–52.0)
HEMOGLOBIN: 8.5 g/dL — AB (ref 13.0–17.0)
MCH: 28.1 pg (ref 26.0–34.0)
MCHC: 30.5 g/dL (ref 30.0–36.0)
MCV: 92.1 fL (ref 78.0–100.0)
Platelets: 182 10*3/uL (ref 150–400)
RBC: 3.03 MIL/uL — ABNORMAL LOW (ref 4.22–5.81)
RDW: 17.9 % — ABNORMAL HIGH (ref 11.5–15.5)
WBC: 7.1 10*3/uL (ref 4.0–10.5)

## 2014-05-17 LAB — HEPARIN LEVEL (UNFRACTIONATED): Heparin Unfractionated: 0.23 IU/mL — ABNORMAL LOW (ref 0.30–0.70)

## 2014-05-17 LAB — CULTURE, RESPIRATORY: CULTURE: NO GROWTH

## 2014-05-17 MED ORDER — METOPROLOL TARTRATE 1 MG/ML IV SOLN
2.5000 mg | INTRAVENOUS | Status: DC | PRN
Start: 2014-05-17 — End: 2014-05-26
  Administered 2014-05-25: 2.5 mg via INTRAVENOUS
  Filled 2014-05-17: qty 5

## 2014-05-17 MED ORDER — FENTANYL CITRATE 0.05 MG/ML IJ SOLN
25.0000 ug | INTRAMUSCULAR | Status: DC | PRN
  Administered 2014-05-18 (×2): 100 ug via INTRAVENOUS
  Administered 2014-05-18 – 2014-05-19 (×4): 50 ug via INTRAVENOUS
  Administered 2014-05-20 – 2014-05-22 (×5): 100 ug via INTRAVENOUS
  Administered 2014-05-22: 50 ug via INTRAVENOUS
  Administered 2014-05-22 – 2014-05-23 (×4): 100 ug via INTRAVENOUS
  Administered 2014-05-24: 50 ug via INTRAVENOUS
  Administered 2014-05-24 (×2): 100 ug via INTRAVENOUS
  Administered 2014-05-25 (×2): 50 ug via INTRAVENOUS
  Filled 2014-05-17 (×21): qty 2

## 2014-05-17 MED ORDER — HEPARIN (PORCINE) IN NACL 100-0.45 UNIT/ML-% IJ SOLN
1000.0000 [IU]/h | INTRAMUSCULAR | Status: DC
  Administered 2014-05-17: 1000 [IU]/h via INTRAVENOUS
  Filled 2014-05-17: qty 250

## 2014-05-17 MED ORDER — METOPROLOL TARTRATE 25 MG/10 ML ORAL SUSPENSION
25.0000 mg | Freq: Two times a day (BID) | ORAL | Status: DC
  Filled 2014-05-17 (×2): qty 10

## 2014-05-17 MED ORDER — HEPARIN (PORCINE) IN NACL 100-0.45 UNIT/ML-% IJ SOLN
1200.0000 [IU]/h | INTRAMUSCULAR | Status: DC
  Administered 2014-05-17: 1200 [IU]/h via INTRAVENOUS
  Filled 2014-05-17: qty 250

## 2014-05-17 MED ORDER — METOPROLOL TARTRATE 25 MG/10 ML ORAL SUSPENSION
25.0000 mg | Freq: Two times a day (BID) | ORAL | Status: DC
  Administered 2014-05-17 – 2014-05-22 (×11): 25 mg
  Filled 2014-05-17 (×13): qty 10

## 2014-05-17 NOTE — Plan of Care (Signed)
Problem: Phase I Progression Outcomes Goal: Pain controlled with appropriate interventions Outcome: Progressing     

## 2014-05-17 NOTE — Progress Notes (Signed)
PULMONARY / CRITICAL CARE MEDICINE   Name: [name] MRN: [id number] DOB: [birth year]    ADMISSION DATE:  05/08/2014 CONSULTATION DATE:  05/17/2014  REFERRING MD :  Rama  CHIEF COMPLAINT:  Respiratory failure in setting epistaxis  INITIAL PRESENTATION:  68 y.o. M brought to [facility] 11/8 with epistaxis.  Had rhinopack placed with temporary cessation of bleeding.  Seen by ENT 11/9 and had surgicel packing placed with apparent cessation of bleeding.  In early AM hours 11/10, developed worsening dyspnea despite NRB mask.  He was transferred to the ICU and intubated by CRNA for resp acidosis.    PMH - chronic 2L O2, frequent nosebleeds due to being on warfarin and plavix for hx of 2 mechanical heart valves.    STUDIES:  None  SIGNIFICANT EVENTS: 11/8 - admitted 11/9 - ENT consult 11/10 - transferred to ICU, intubated   SUBJECTIVE: sedated, squirming, RASS-1,remains on PEEP 8 Afebrile no more bleeding UO improved with lasix  VITAL SIGNS: Temp:  [98.2 F (36.8 C)-99.9 F (37.7 C)] 99 F (37.2 C) (11/12 0800) Pulse Rate:  [30-131] 105 (11/12 0800) Resp:  [16-26] 21 (11/12 0800) BP: (84-125)/(40-85) 125/60 mmHg (11/12 0800) SpO2:  [85 %-96 %] 90 % (11/12 0800) FiO2 (%):  [40 %-50 %] 40 % (11/12 0800) HEMODYNAMICS:   VENTILATOR SETTINGS: Vent Mode:  [-] PRVC FiO2 (%):  [40 %-50 %] 40 % Set Rate:  [24 bmp] 24 bmp Vt Set:  [380 mL] 380 mL PEEP:  [8 cmH20] 8 cmH20 Plateau Pressure:  [15 cmH20-17 cmH20] 17 cmH20 INTAKE / OUTPUT: Intake/Output      11/11 0701 - 11/12 0700 11/12 0701 - 11/13 0700   I.V. (mL/kg) 574 (8.2) 23.2 (0.3)   Other     NG/GT 1491 90   IV Piggyback 150    Total Intake(mL/kg) 2215 (31.5) 113.2 (1.6)   Urine (mL/kg/hr) 585 (0.3) 125 (1.2)   Emesis/NG output     Total Output 585 125   Net +1630 -11.9          PHYSICAL EXAMINATION: General: Elderly male, chronically ill appearing, Sedated, in NAD. Neuro: Sedated on vent. HEENT: [place]/AT. PERRL,  sclerae anicteric.  B/l nares packed, dried blood noted. Cardiovascular: IRIR, SEM with click noted.  Lungs: Respirations even and unlabored.  CTA bilaterally, No W/R/R. Abdomen: BS x 4, soft, NT/ND.  Musculoskeletal: No gross deformities, no edema.  Skin: Multiple small skin excoriations with scabs, multiple bruises, warm, diffuse erythematous rash.  LABS:  CBC  Recent Labs Lab 05/15/14 0515 05/16/14 0338 05/17/14 0500  WBC 9.1 8.3 7.1  HGB 10.0* 9.0* 8.5*  HCT 34.1* 29.4* 27.9*  PLT 243 197 182   Coag's  Recent Labs Lab 05/14/14 0358 05/16/14 0338 05/17/14 0500  INR 2.17* 2.47* 1.99*   BMET  Recent Labs Lab 05/15/14 0950 05/16/14 0338 05/17/14 0500  NA 136* 133* 139  K 4.8 4.1 4.1  CL 95* 93* 96  CO2 29 32 31  BUN 53* 60* 74*  CREATININE 1.36* 2.00* 2.23*  GLUCOSE 89 118* 123*   Electrolytes  Recent Labs Lab 05/15/14 0950 05/16/14 0338 05/17/14 0500  CALCIUM 8.9 8.6 8.4  MG  --  2.2  --   PHOS  --  3.6  --    Sepsis Markers  Recent Labs Lab 05/15/14 0515 05/16/14 0348 05/17/14 0500  PROCALCITON <0.10 0.34 0.34   ABG  Recent Labs Lab 05/15/14 0900 05/15/14 1728 05/17/14 0350  PHART 7.427 7.406 7.387  PCO2ART 49.0* 52.1* 51.2*  PO2ART 55.5* 55.4* 66.7*   Liver Enzymes No results for input(s): AST, ALT, ALKPHOS, BILITOT, ALBUMIN in the last 168 hours. Cardiac Enzymes  Recent Labs Lab 05/10/2014 2157  TROPONINI <0.30  PROBNP 13471.0*   Glucose  Recent Labs Lab 05/16/14 0401 05/16/14 0736 05/16/14 1214 05/16/14 1535 05/16/14 2008 05/17/14 0418  GLUCAP 119* 124* 118* 125* 133* 128*    Imaging Dg Abd 1 View  05/15/2014   CLINICAL DATA:  Orogastric tube placement  EXAM: ABDOMEN - 1 VIEW  COMPARISON:  02/06/2014  FINDINGS: Orogastric tube tip at the proximal stomach. Advancement by 6 cm would provide more stable positioning.  The bowel gas pattern is nonobstructive. Multiple skin or bedding folds are noted over the abdomen.  The visualized bowel gas pattern is nonobstructed. Extensive arterial calcification again noted.  IMPRESSION: Orogastric tube tip at the proximal stomach. Advancement by approximately 6 cm would provide more secure positioning.   Electronically Signed   By: Jorje Guild M.D.   On: 05/15/2014 10:22   Dg Chest Port 1 View  05/17/2014   CLINICAL DATA:  Hypoxia  EXAM: PORTABLE CHEST - 1 VIEW  COMPARISON:  May 16, 2014  FINDINGS: Endotracheal tube tip is 6.5 cm above the carina. Central catheter tip is in the superior cava. Nasogastric tube tip and side port are below the diaphragm. No pneumothorax. Cardiomegaly persists. There is diffuse interstitial edema with bilateral effusions. There is increase in opacity behind the left heart, likely due to layering effusion. Valve replacements and coronary artery bypass grafting noted.  IMPRESSION: Persistent congestive heart failure. Suspect larger effusion on the left. There may be some airspace consolidation in the left base as well ; a degree of superimposed pneumonia in the left base currently cannot be excluded. No pneumothorax. Tube and catheter positions as described.   Electronically Signed   By: Lowella Grip M.D.   On: 05/17/2014 07:16   Dg Chest Port 1 View  05/16/2014   CLINICAL DATA:  Hypoxia  EXAM: PORTABLE CHEST - 1 VIEW  COMPARISON:  May 15, 2014  FINDINGS: Endotracheal tube tip is 2.7 cm above carina. Central catheter tip is in the superior cava near cavoatrial junction. Nasogastric tube tip and side port are below the diaphragm in stomach. No pneumothorax. There remains generalized interstitial edema with patchy atelectatic change in the bases, more on the left than on the right. There is stable cardiomegaly. The pulmonary vascularity is within normal limits. No adenopathy. Patient is status post coronary artery bypass grafting and valve replacements.  IMPRESSION: Findings consistent with congestive heart failure. Left base atelectasis  present. Tube and catheter positions as described without pneumothorax.   Electronically Signed   By: Lowella Grip M.D.   On: 05/16/2014 07:03     ASSESSMENT / PLAN:  PULMONARY OETT 11/10 >>> A: Acute on chronic hypoxic respiratory failure ARDS COPD without evidence of exacerbation Likely blood aspiration rather than Pulmonary edema Small b/l pleural effusions Tobacco use  P:   Full mechanical support. ARDS protocol -lower PEEP to 8 VAP bundle. SBT when hypoxia improved DuoNebs / Albuterol. Tobacco cessation.   CARDIOVASCULAR A:  A.fib rate controlled Hx mechanical MVR and AVR in 2003 Chronic anticoagulation due to above Acute diastolic CHF Hx  HTN, HLD P:  PO antihypertensives / diuretics discontinued for now. Coumadin / Plavix currently on hold -does he need plavix given recurrent nose bleeds?  Start IV heparin - (warfarin and plavix have been held since  admit.  INR currently 2.0).   RENAL A:  AKI ? Diuretic induced CKD - stable Hyponatremia P:   NS @ KVO. BMP in AM. Hold further  lasix   GASTROINTESTINAL A:   GERD Nutrition P:   Pantoprazole. NPO. TF   HEMATOLOGIC A:   Chronic anemia On chronic anticoagulation VTE Prophylaxis P:  Transfuse for Hgb < 7. SCD's only. Daily INR - (warfarin and plavix have been held since admit). IV heparin   INFECTIOUS A:   Concern for aspiration PNA P:   Sputum Cx 11/10 >>>ng  Abx: Vanc, start date 11/10 >> 11/11 Abx: Zosyn, start date 11/10,  Dc vanc  ENDOCRINE A:   No known issues P:   Monitor glucose on BMP.  NEUROLOGIC A:   Acute metabolic encephalopathy ETOH abuse P:   Sedation:  Propofol gtt / Fentanyl PRN. RASS goal: 0 to -1. Daily WUA. Thiamine / Folate / Multivitamin.  Rash  - unclear cause, ?fentanyl Change to int fent  Family updated: s-in law HCPOA - updated 11/10  Interdisciplinary Family Meeting v Palliative Care Meeting:  Due by: 11/17.  Summary - ARDS range  hypoxia probably due to aspirated blood with dCHF,severe underlying COPD & autoPEEP, diuresis limited by worsening renal function.  The patient is critically ill with multiple organ systems failure and requires high complexity decision making for assessment and support, frequent evaluation and titration of therapies, application of advanced monitoring technologies and extensive interpretation of multiple databases. Critical Care Time devoted to patient care services described in this note is 35 minutes.   Kara Mead MD. Shade Flood. [place] Pager [phone] If no response call [phone]     05/17/2014, 8:27 AM  Rigoberto Noel, MD

## 2014-05-17 NOTE — Progress Notes (Signed)
PHARMACY BRIEF NOTE - Drug Level Result   Consult for:  IV Heparin Indication:  Presence of mechanical heart valves, warfarin on hold  With the infusion of 1000 units/hr, the heparin level drawn at 18:15 today is reported as 0.23 units/ml.  This level is below the therapeutic range, 0.3-0.7 units/ml.  The patient's nurse states that there has been no interruption of the infusion or problems at the IV site, and no bleeding has been seen.  Plan:  Increase the infusion to 1200 units/ml  Repeat the heparin level 8 hours after changing the infusion rate.  [employer] [employer]. 05/17/2014 7:07 PM

## 2014-05-17 NOTE — Progress Notes (Signed)
243mL of 33mcg/ml of fentanyl wasted in sink. Witnessed by Park Breed, RN.

## 2014-05-17 NOTE — Progress Notes (Addendum)
ANTICOAGULATION CONSULT NOTE - Initial Consult  Pharmacy Consult for Heparin Indication: mechanical heart valves  No Known Allergies  Patient Measurements: Height: 5\' 6"  (167.6 cm) Weight: 154 lb 15.7 oz (70.3 kg) IBW/kg (Calculated) : 63.8  Vital Signs: Temp: 99 F (37.2 C) (11/12 0800) BP: 125/60 mmHg (11/12 0800) Pulse Rate: 105 (11/12 0800)  Labs:  Recent Labs  05/15/14 0515 05/15/14 0950 05/16/14 0338 05/17/14 0500  HGB 10.0*  --  9.0* 8.5*  HCT 34.1*  --  29.4* 27.9*  PLT 243  --  197 182  LABPROT  --   --  26.9* 22.8*  INR  --   --  2.47* 1.99*  CREATININE  --  1.36* 2.00* 2.23*    Estimated Creatinine Clearance: 28.6 mL/min (by C-G formula based on Cr of 2.23).   Medical History: Past Medical History  Diagnosis Date  . Essential hypertension, benign   . Coronary atherosclerosis of native coronary artery     a. CABG/MVR/AVR-2003 (#25 St. Jude/#21 St. Jude); anticoagulation; b. negative stress nuclear-2005;  c. 01/2013 NSTEMI/Cath/PCI: LM nl, LAD 50p, 40-65m, D1 50ost, LCX 70m, RCA 50/33m (2.5x20 Promus DES), 50d, VG->Diag 100, VG->PDA 100, VG->OM 100, LIMA->LAD ok.  . Epistaxis     Requiring cautery & aterial ligation-09/2009  . GERD (gastroesophageal reflux disease)   . Hyperlipemia   . Abnormal LFTs     Possible cirrhosis  . Chronic anticoagulation   . Valvular heart disease     a. 2003: MVR/AVR-2003 (#25 St. Jude/#21 St. Jude);  b. 01/2013 Echo: EF 55%, Mech AVR mean grad 12, Mech MVR mean grad 6.  . Tobacco abuse     45 pack years  . Fasting hyperglycemia   . Nephrolithiasis   . Diverticulosis   . Hyponatremia   . Chronic diastolic CHF (congestive heart failure)   . Chronic renal disease   . Hemolytic anemia   . ETOH abuse   . Atrial fibrillation 01/16/2013    On coumadin DCCV 07/2013.   Marland Kitchen COPD (chronic obstructive pulmonary disease)   . Stage III chronic kidney disease      Assessment: 68 y.o. M with PMH of above, including prosthetic MV  and Ao valve, atrial fibrillation admitted with epistaxis (hx of frequent nose bleeds). Patient on chronic anticoagulation with warfarin prior to admission. (INR 2.28 upon admissio)   CBC: Hgb 8.5 (trending down), Pltc WNL today (trending down) Baseline PT: 22.8 Baseline INR: 1.99  Goal of Therapy:  Heparin level 0.3-0.7 units/ml (Recommend aiming for lower end of therapeutic (0.3-0.5 units/ml) d/t bleeding risk Monitor platelets by anticoagulation protocol: Yes   Plan:  No Heparin bolus d/t increased baseline INR, bleeding risk Start Heparin infusion @ 1000units/hr Obtain heparin level 8 hours after start of infusion Follow CBC, renal function, daily heparin levels  Kizzie Furnish, PharmD Pager: [phone] 05/17/2014 8:58 AM

## 2014-05-17 NOTE — Plan of Care (Signed)
Problem: Phase I Progression Outcomes Goal: Hemodynamically stable Outcome: Progressing     

## 2014-05-17 NOTE — Care Management Note (Addendum)
    Page 1 of 2   05/24/2014     2:40:00 PM CARE MANAGEMENT NOTE 05/24/2014  Patient:  [name], [name]   Account Number:  [id number]  Date Initiated:  05/14/2014  Documentation initiated by:  Karl Bales  Subjective/Objective Assessment:   admitted 05/31/2014 with epistaxis and dyspnea with hypoxia (patient unable to use his nasal cannula oxygen at home secondary to epistaxis). Nasal packing was instituted in the ED.     Action/Plan:   FROM HOME   Anticipated DC Date:  05/27/2014   Anticipated DC Plan:  [place]  CM consult      Choice offered to / List presented to:             Status of service:  In process, will continue to follow Medicare Important Message given?   (If response is "NO", the following Medicare IM given date fields will be blank) Date Medicare IM given:   Medicare IM given by:   Date Additional Medicare IM given:   Additional Medicare IM given by:    Discharge Disposition:    Per UR Regulation:  Reviewed for med. necessity/level of care/duration of stay  If discussed at [place] of Stay Meetings, dates discussed:    Comments:  [id number]/Rhonda Rosana Hoes, RN, BSN, CCM Chart reviewed. Discharge needs and patient's stay to be reviewed and followed by case manager. Chart note for progression of stay: Extensive discussion regarding patients current state of health, prior state of health and his prior wishes from advanced directives.  Family (sister-in-law, niece, step-daughter and grand step daughter) indicate that he would not want to live supported in an artifical state.  He would not want tracheostomy, chronic vent or facility living, feeding tube.  In the past, the patient has even refused home health care.  They are all in agreement and understand that CPR would not be of benefit given current health state.  Informed them that he has been deemed not an HD candidate per Nephrology.  [id number]/TOIZTI Rosana Hoes, RN, BSN,  CCM Chart reviewed. Discharge needs and patient's stay to be reviewed and followed by case manager. Chart note for progression of stay: Vent day 6 ASSESSMENT AND PLAN: 1. Mechanical AVR and MVR:  Continue IV heparin  2. Atrial fibrillation: Rate controlled, 95-105 bpm. Continue metoprolol. Titrate as needed for rate control. BP is stable. 3. Acute diastolic heart failure: On IV Lasix. Renal function is worsening. His weight is still up 13 lbs over admit weight. Additional diuresis may benefit attempts at extubation. Echo with normal LVEF=55%.    05/17/2014/Rhonda L. Rosana Hoes, RN, BSN, CCM: Chart review for medical necessity and patient discharge needs. Case Manager will follow for patient condition changes. chart note for current events: 68 y.o. M brought to [facility] 11/8 with epistaxis.  Had rhinopack placed with temporary cessation of bleeding.  Seen by ENT 11/9 and had surgicel packing placed with apparent cessation of bleeding.  In early AM hours 11/10, developed worsening dyspnea despite NRB mask.  He was transferred to the ICU and intubated by CRNA for resp acidosis.     PMH - chronic 2L O2, frequent nosebleeds due to being on warfarin and plavix for hx of 2 mechanical heart valves.      05/14/14 MMcgibboney, RN, BSN Chart reviewed.

## 2014-05-17 NOTE — Progress Notes (Addendum)
SUBJECTIVE:  No acute events.  Nasal drainage is now more black than red.  OBJECTIVE:   Vitals:   Filed Vitals:   05/17/14 0345 05/17/14 0400 05/17/14 0500 05/17/14 0600  BP: 102/61 106/53 117/52 104/54  Pulse: 110 113 56 100  Temp: 99 F (37.2 C) 99 F (37.2 C) 99 F (37.2 C) 99 F (37.2 C)  TempSrc:      Resp: 24 24 24 24   Height:      Weight:      SpO2: 96% 96% 96% 93%   I&O's:   Intake/Output Summary (Last 24 hours) at 05/17/14 0735 Last data filed at 05/17/14 0600  Gross per 24 hour  Intake 2130.11 ml  Output    585 ml  Net 1545.11 ml   TELEMETRY: Reviewed telemetry pt in AFib, variable rate control.:     PHYSICAL EXAM General: Intubated sedated Head:   Normal cephalic and atramatic  Lungs:  Coarse BS bilaterally Heart:  Irregularly irregular crisp S1 crisp S2 clicks  No JVD.   Abdomen: abdomen soft and non-tender Msk:  Back normal,  Normal strength and tone for age. Extremities:   No edema.   Neuro:Intubated, sedated Psych:  Intubated, sedated Skin: No rash   LABS: Basic Metabolic Panel:  Recent Labs  05/16/14 0338 05/17/14 0500  NA 133* 139  K 4.1 4.1  CL 93* 96  CO2 32 31  GLUCOSE 118* 123*  BUN 60* 74*  CREATININE 2.00* 2.23*  CALCIUM 8.6 8.4  MG 2.2  --   PHOS 3.6  --    Liver Function Tests: No results for input(s): AST, ALT, ALKPHOS, BILITOT, PROT, ALBUMIN in the last 72 hours. No results for input(s): LIPASE, AMYLASE in the last 72 hours. CBC:  Recent Labs  05/16/14 0338 05/17/14 0500  WBC 8.3 7.1  HGB 9.0* 8.5*  HCT 29.4* 27.9*  MCV 91.0 92.1  PLT 197 182   Cardiac Enzymes: No results for input(s): CKTOTAL, CKMB, CKMBINDEX, TROPONINI in the last 72 hours. BNP: Invalid input(s): POCBNP D-Dimer: No results for input(s): DDIMER in the last 72 hours. Hemoglobin A1C: No results for input(s): HGBA1C in the last 72 hours. Fasting Lipid Panel:  Recent Labs  05/15/14 0515  TRIG 85   Thyroid Function Tests: No  results for input(s): TSH, T4TOTAL, T3FREE, THYROIDAB in the last 72 hours.  Invalid input(s): FREET3 Anemia Panel: No results for input(s): VITAMINB12, FOLATE, FERRITIN, TIBC, IRON, RETICCTPCT in the last 72 hours. Coag Panel:   Lab Results  Component Value Date   INR 1.99* 05/17/2014   INR 2.47* 05/16/2014   INR 2.17* 05/14/2014    RADIOLOGY: Dg Abd 1 View  05/15/2014   CLINICAL DATA:  Orogastric tube placement  EXAM: ABDOMEN - 1 VIEW  COMPARISON:  02/06/2014  FINDINGS: Orogastric tube tip at the proximal stomach. Advancement by 6 cm would provide more stable positioning.  The bowel gas pattern is nonobstructive. Multiple skin or bedding folds are noted over the abdomen. The visualized bowel gas pattern is nonobstructed. Extensive arterial calcification again noted.  IMPRESSION: Orogastric tube tip at the proximal stomach. Advancement by approximately 6 cm would provide more secure positioning.   Electronically Signed   By: Jorje Guild M.D.   On: 05/15/2014 10:22   Dg Chest Port 1 View  05/17/2014   CLINICAL DATA:  Hypoxia  EXAM: PORTABLE CHEST - 1 VIEW  COMPARISON:  May 16, 2014  FINDINGS: Endotracheal tube tip is 6.5 cm above the carina.  Central catheter tip is in the superior cava. Nasogastric tube tip and side port are below the diaphragm. No pneumothorax. Cardiomegaly persists. There is diffuse interstitial edema with bilateral effusions. There is increase in opacity behind the left heart, likely due to layering effusion. Valve replacements and coronary artery bypass grafting noted.  IMPRESSION: Persistent congestive heart failure. Suspect larger effusion on the left. There may be some airspace consolidation in the left base as well ; a degree of superimposed pneumonia in the left base currently cannot be excluded. No pneumothorax. Tube and catheter positions as described.   Electronically Signed   By: Lowella Grip M.D.   On: 05/17/2014 07:16   Dg Chest Port 1  View  05/16/2014   CLINICAL DATA:  Hypoxia  EXAM: PORTABLE CHEST - 1 VIEW  COMPARISON:  May 15, 2014  FINDINGS: Endotracheal tube tip is 2.7 cm above carina. Central catheter tip is in the superior cava near cavoatrial junction. Nasogastric tube tip and side port are below the diaphragm in stomach. No pneumothorax. There remains generalized interstitial edema with patchy atelectatic change in the bases, more on the left than on the right. There is stable cardiomegaly. The pulmonary vascularity is within normal limits. No adenopathy. Patient is status post coronary artery bypass grafting and valve replacements.  IMPRESSION: Findings consistent with congestive heart failure. Left base atelectasis present. Tube and catheter positions as described without pneumothorax.   Electronically Signed   By: Lowella Grip M.D.   On: 05/16/2014 07:03   Dg Chest Port 1 View  05/15/2014   CLINICAL DATA:  Central line placement.  EXAM: PORTABLE CHEST - 1 VIEW  COMPARISON:  Single view of the chest 05/15/2014 at 4:46 a.m.  FINDINGS: New right IJ catheter is in place with the tip projecting over the lower superior vena cava. No pneumothorax is identified. Endotracheal tube is again noted. Extensive bilateral pulmonary edema and small pleural effusions are unchanged.  IMPRESSION: Right IJ catheter tip projects over the lower superior vena cava. Negative for pneumothorax.  No change in bilateral airspace disease most consistent with pulmonary edema.   Electronically Signed   By: Inge Rise M.D.   On: 05/15/2014 06:40   Portable Chest Xray  05/15/2014   CLINICAL DATA:  Status post intubation.  EXAM: PORTABLE CHEST - 1 VIEW  COMPARISON:  Single view of the chest 05/15/2014 at 4:04 a.m.  FINDINGS: New endotracheal tube is in place with the tip in good position just below the clavicular heads. Cardiomegaly, pulmonary edema and pleural effusions persist without change. No pneumothorax.  IMPRESSION: ET tube projects in  good position.  No change in pulmonary edema and pleural effusions.   Electronically Signed   By: Inge Rise M.D.   On: 05/15/2014 05:13   Dg Chest Port 1 View  05/15/2014   CLINICAL DATA:  Hypoxia.  Decreased oxygen saturation.  EXAM: PORTABLE CHEST - 1 VIEW  COMPARISON:  Single view of the chest 05/24/2014 02/08/2014.  FINDINGS: There is extensive bilateral airspace disease and right greater than left pleural effusions. Cardiomegaly is noted. No pneumothorax identified.  IMPRESSION: Findings most consistent with congestive heart failure with associated right greater than left pleural effusions.   Electronically Signed   By: Inge Rise M.D.   On: 05/15/2014 04:16   Dg Chest Portable 1 View  05/28/2014   CLINICAL DATA:  Cough and shortness of breath today. Epistaxis today.  EXAM: PORTABLE CHEST - 1 VIEW  COMPARISON:  02/08/2014.  FINDINGS: There  are changes from cardiac surgery and valve replacement. Moderate enlargement of the cardiopericardial silhouette. No mediastinal or hilar masses.  There is vascular congestion interstitial thickening bilaterally. Mild hazy opacity at the lung bases suggests small effusions. No pneumothorax. No focal pneumonia.  IMPRESSION: 1. Findings consistent with congestive heart failure with cardiomegaly, interstitial edema and small effusions.   Electronically Signed   By: Lajean Manes M.D.   On: 05/16/2014 21:52      ASSESSMENT: Kathyrn Lass:   Epistaxis with prosethetic MV and Ao valve.  Plan is to start IV heparin when INR drops below 1.5. By exam, valves functioning well.  The sooner we can have the patient on anticoagulation with heparin the better from a cardiac standpoint due to risk of valve thrombosis (particularly mitral valve).  Difficult situation due to worsening anemia and renal function as well.   AFib: borderline rate control.  COntinue to monitor. IV metoprolol for now.   Acute diastolic heart failure contributing to resp failure.  Difficult to  diurese due to worsening renal failure.  Jettie Booze., MD  05/17/2014  7:35 AM

## 2014-05-18 ENCOUNTER — Inpatient Hospital Stay (HOSPITAL_COMMUNITY): Payer: Medicare Other

## 2014-05-18 LAB — GLUCOSE, CAPILLARY
GLUCOSE-CAPILLARY: 132 mg/dL — AB (ref 70–99)
Glucose-Capillary: 146 mg/dL — ABNORMAL HIGH (ref 70–99)
Glucose-Capillary: 160 mg/dL — ABNORMAL HIGH (ref 70–99)
Glucose-Capillary: 126 mg/dL — ABNORMAL HIGH (ref 70–99)
Glucose-Capillary: 143 mg/dL — ABNORMAL HIGH (ref 70–99)
Glucose-Capillary: 135 mg/dL — ABNORMAL HIGH (ref 70–99)

## 2014-05-18 LAB — BLOOD GAS, ARTERIAL
ACID-BASE EXCESS: 5.6 mmol/L — AB (ref 0.0–2.0)
BICARBONATE: 30.4 meq/L — AB (ref 20.0–24.0)
Drawn by: 103701
FIO2: 0.4 %
O2 Saturation: 85.2 %
PCO2 ART: 48.9 mmHg — AB (ref 35.0–45.0)
PEEP: 5 cmH2O
Patient temperature: 98.6
Pressure support: 5 cmH2O
TCO2: 28.7 mmol/L (ref 0–100)
pH, Arterial: 7.411 (ref 7.350–7.450)
pO2, Arterial: 54.9 mmHg — ABNORMAL LOW (ref 80.0–100.0)

## 2014-05-18 LAB — CBC
HCT: 27.4 % — ABNORMAL LOW (ref 39.0–52.0)
Hemoglobin: 8 g/dL — ABNORMAL LOW (ref 13.0–17.0)
MCH: 26.8 pg (ref 26.0–34.0)
MCHC: 29.2 g/dL — AB (ref 30.0–36.0)
MCV: 91.6 fL (ref 78.0–100.0)
Platelets: 180 10*3/uL (ref 150–400)
RBC: 2.99 MIL/uL — ABNORMAL LOW (ref 4.22–5.81)
RDW: 18.2 % — AB (ref 11.5–15.5)
WBC: 7.2 10*3/uL (ref 4.0–10.5)

## 2014-05-18 LAB — HEPARIN LEVEL (UNFRACTIONATED)
HEPARIN UNFRACTIONATED: 0.3 [IU]/mL (ref 0.30–0.70)
HEPARIN UNFRACTIONATED: 0.45 [IU]/mL (ref 0.30–0.70)

## 2014-05-18 LAB — BASIC METABOLIC PANEL
Anion gap: 12 (ref 5–15)
BUN: 75 mg/dL — ABNORMAL HIGH (ref 6–23)
CALCIUM: 8.7 mg/dL (ref 8.4–10.5)
CO2: 31 mEq/L (ref 19–32)
CREATININE: 1.96 mg/dL — AB (ref 0.50–1.35)
Chloride: 96 mEq/L (ref 96–112)
GFR calc non Af Amer: 33 mL/min — ABNORMAL LOW (ref >90)
GFR, EST AFRICAN AMERICAN: 39 mL/min — AB (ref >90)
Glucose, Bld: 128 mg/dL — ABNORMAL HIGH (ref 70–99)
Potassium: 3.7 mEq/L (ref 3.7–5.3)
Sodium: 139 mEq/L (ref 137–147)

## 2014-05-18 LAB — PROTIME-INR
INR: 1.74 — ABNORMAL HIGH (ref 0.00–1.49)
PROTHROMBIN TIME: 20.5 s — AB (ref 11.6–15.2)

## 2014-05-18 LAB — TRIGLYCERIDES: Triglycerides: 96 mg/dL (ref <150)

## 2014-05-18 MED ORDER — FUROSEMIDE 10 MG/ML IJ SOLN
40.0000 mg | Freq: Once | INTRAMUSCULAR | Status: AC
  Administered 2014-05-18: 40 mg via INTRAVENOUS
  Filled 2014-05-18: qty 4

## 2014-05-18 MED ORDER — HEPARIN (PORCINE) IN NACL 100-0.45 UNIT/ML-% IJ SOLN
1200.0000 [IU]/h | INTRAMUSCULAR | Status: DC
  Administered 2014-05-18 – 2014-05-19 (×3): 1300 [IU]/h via INTRAVENOUS
  Filled 2014-05-18 (×2): qty 250

## 2014-05-18 NOTE — Progress Notes (Signed)
PULMONARY / CRITICAL CARE MEDICINE   Name: [name] MRN: [id number] DOB: [birth year]    ADMISSION DATE:  05/16/2014 CONSULTATION DATE:  05/18/2014  REFERRING MD :  Rama  CHIEF COMPLAINT:  Respiratory failure in setting epistaxis  INITIAL PRESENTATION:  68 y.o. M brought to [facility] 11/8 with epistaxis.  Had rhinopack placed with temporary cessation of bleeding.  Seen by ENT 11/9 and had surgicel packing placed with apparent cessation of bleeding.  In early AM hours 11/10, developed worsening dyspnea despite NRB mask.  He was transferred to the ICU and intubated by CRNA for resp acidosis.    PMH - chronic 2L O2, frequent nosebleeds due to being on warfarin and plavix for hx of 2 mechanical heart valves.    STUDIES:  None  SIGNIFICANT EVENTS: 11/8 - admitted 11/9 - ENT consult 11/10 - transferred to ICU, intubated   SUBJECTIVE:  squirming, RASS+1 on low dose propofol, Weaning well on PS 5/5 Afebrile no more bleeding on heparin   VITAL SIGNS: Temp:  [97.3 F (36.3 C)-99 F (37.2 C)] 97.5 F (36.4 C) (11/13 0600) Pulse Rate:  [54-153] 55 (11/13 0600) Resp:  [20-27] 21 (11/13 0600) BP: (89-127)/(32-77) 94/32 mmHg (11/13 0600) SpO2:  [88 %-94 %] 88 % (11/13 0901) FiO2 (%):  [40 %] 40 % (11/13 0600) Weight:  [72.5 kg (159 lb 13.3 oz)] 72.5 kg (159 lb 13.3 oz) (11/13 0355) HEMODYNAMICS:   VENTILATOR SETTINGS: Vent Mode:  [-] PRVC FiO2 (%):  [40 %] 40 % Set Rate:  [24 bmp] 24 bmp Vt Set:  [380 mL] 380 mL PEEP:  [5 cmH20-8 cmH20] 5 cmH20 Plateau Pressure:  [12 cmH20-16 cmH20] 12 cmH20 INTAKE / OUTPUT: Intake/Output      11/12 0701 - 11/13 0700 11/13 0701 - 11/14 0700   I.V. (mL/kg) 773.3 (10.7)    NG/GT 1590    IV Piggyback 150    Total Intake(mL/kg) 2513.3 (34.7)    Urine (mL/kg/hr) 1365 (0.8)    Stool 1 (0)    Total Output 1366     Net +1147.3            PHYSICAL EXAMINATION: General: Elderly male, chronically ill appearing, Sedated, in NAD. Neuro: Sedated  on vent. HEENT: [place]/AT. PERRL, sclerae anicteric.  B/l nares packed, dried blood noted. Cardiovascular: IRIR, SEM with click noted.  Lungs: Respirations even and unlabored.  CTA bilaterally, No W/R/R. Abdomen: BS x 4, soft, NT/ND.  Musculoskeletal: No gross deformities, no edema.  Skin: Multiple small skin excoriations with scabs, multiple bruises, warm, diffuse erythematous rash.  LABS:  CBC  Recent Labs Lab 05/16/14 0338 05/17/14 0500 05/18/14 0400  WBC 8.3 7.1 7.2  HGB 9.0* 8.5* 8.0*  HCT 29.4* 27.9* 27.4*  PLT 197 182 180   Coag's  Recent Labs Lab 05/16/14 0338 05/17/14 0500 05/18/14 0400  INR 2.47* 1.99* 1.74*   BMET  Recent Labs Lab 05/16/14 0338 05/17/14 0500 05/18/14 0400  NA 133* 139 139  K 4.1 4.1 3.7  CL 93* 96 96  CO2 32 31 31  BUN 60* 74* 75*  CREATININE 2.00* 2.23* 1.96*  GLUCOSE 118* 123* 128*   Electrolytes  Recent Labs Lab 05/16/14 0338 05/17/14 0500 05/18/14 0400  CALCIUM 8.6 8.4 8.7  MG 2.2  --   --   PHOS 3.6  --   --    Sepsis Markers  Recent Labs Lab 05/15/14 0515 05/16/14 0348 05/17/14 0500  PROCALCITON <0.10 0.34 0.34   ABG  Recent  Labs Lab 05/15/14 0900 05/15/14 1728 05/17/14 0350  PHART 7.427 7.406 7.387  PCO2ART 49.0* 52.1* 51.2*  PO2ART 55.5* 55.4* 66.7*   Liver Enzymes No results for input(s): AST, ALT, ALKPHOS, BILITOT, ALBUMIN in the last 168 hours. Cardiac Enzymes  Recent Labs Lab 05/27/2014 2157  TROPONINI <0.30  PROBNP 13471.0*   Glucose  Recent Labs Lab 05/17/14 0735 05/17/14 1334 05/17/14 1536 05/17/14 1920 05/17/14 2359 05/18/14 0440  GLUCAP 102* 115* 132* 134* 146* 160*    Imaging Dg Chest Port 1 View  05/18/2014   CLINICAL DATA:  Respiratory failure.  EXAM: PORTABLE CHEST - 1 VIEW  COMPARISON:  05/2014.  FINDINGS: Endotracheal tube, NG tube, right IJ line in stable position. Stable cardiomegaly. Prior replacement cardiac valves and CABG. Persistent cardiomegaly with bilateral  persistent pulmonary infiltrates and pleural effusions consistent with congestive heart failure. No significant interval change. No pneumothorax.  IMPRESSION: 1. Lines and tubes in stable position. 2. Prior replacement cardiac valves and CABG. Persistent congestive heart failure with bilateral pulmonary edema and pleural effusions, no significant change .   Electronically Signed   By: Marcello Moores  Register   On: 05/18/2014 07:40   Dg Chest Port 1 View  05/17/2014   CLINICAL DATA:  Hypoxia  EXAM: PORTABLE CHEST - 1 VIEW  COMPARISON:  May 16, 2014  FINDINGS: Endotracheal tube tip is 6.5 cm above the carina. Central catheter tip is in the superior cava. Nasogastric tube tip and side port are below the diaphragm. No pneumothorax. Cardiomegaly persists. There is diffuse interstitial edema with bilateral effusions. There is increase in opacity behind the left heart, likely due to layering effusion. Valve replacements and coronary artery bypass grafting noted.  IMPRESSION: Persistent congestive heart failure. Suspect larger effusion on the left. There may be some airspace consolidation in the left base as well ; a degree of superimposed pneumonia in the left base currently cannot be excluded. No pneumothorax. Tube and catheter positions as described.   Electronically Signed   By: Lowella Grip M.D.   On: 05/17/2014 07:16     ASSESSMENT / PLAN:  PULMONARY OETT 11/10 >>> A: Acute on chronic hypoxic respiratory failure ARDS COPD without evidence of exacerbation Likely blood aspiration rather than Pulmonary edema Small b/l pleural effusions Tobacco use  P:   SBTs with goal extubation ARDS protocol -lower PEEP to 5 VAP bundle. DuoNebs / Albuterol. Tobacco cessation.   CARDIOVASCULAR A:  A.fib rate controlled Hx mechanical MVR and AVR in 2003 Chronic anticoagulation due to above Acute diastolic CHF Hx  HTN, HLD P:  PO antihypertensives / diuretics discontinued for now. Coumadin / Plavix  currently on hold -does he need plavix given recurrent nose bleeds?  Started IV heparin -no bleeding x 24h - (warfarin and plavix have been held since admit.  INR currently 2.0).   RENAL A:  AKI ? Diuretic induced CKD - stable Hyponatremia P:   NS @ KVO. BMP in AM. Lasix 40 x1, watch cr, expect to rise   GASTROINTESTINAL A:   GERD Nutrition P:   Pantoprazole. NPO. TF   HEMATOLOGIC A:   Chronic anemia On chronic anticoagulation VTE Prophylaxis P:  Transfuse for Hgb < 7. SCD's only. Daily INR - (warfarin and plavix have been held since admit). IV heparin   INFECTIOUS A:   Concern for aspiration PNA P:   Sputum Cx 11/10 >>>ng  Abx: Vanc, start date 11/10 >> 11/11 Abx: Zosyn, start date 11/10,  Dc vanc  ENDOCRINE A:  No known issues P:   Monitor glucose on BMP.  NEUROLOGIC A:   Acute metabolic encephalopathy ETOH abuse P:   Sedation:  Propofol gtt / Fentanyl PRN. RASS goal: 0 to -1. Daily WUA. Thiamine / Folate / Multivitamin.  Rash  - unclear cause, ?fentanyl Change to int fent  Family updated: s-in law HCPOA - updated 11/12  Interdisciplinary Family Meeting v Palliative Care Meeting:  Due by: 11/17.  Summary - ARDS range hypoxia probably due to aspirated blood with dCHF -improved,severe underlying COPD & autoPEEP, diuresis limited by worsening renal function.  The patient is critically ill with multiple organ systems failure and requires high complexity decision making for assessment and support, frequent evaluation and titration of therapies, application of advanced monitoring technologies and extensive interpretation of multiple databases. Critical Care Time devoted to patient care services described in this note is 35 minutes.   Kara Mead MD. Shade Flood. [facility] Pager [phone] If no response call [phone]     05/18/2014, 9:20 AM  Rigoberto Noel, MD

## 2014-05-18 NOTE — Progress Notes (Signed)
SUBJECTIVE:  Agitation on lower doses of propofol. Increased to 40.  Nasal drainage has decreased.  He is on heparin..  OBJECTIVE:   Vitals:   Filed Vitals:   05/18/14 0400 05/18/14 0500 05/18/14 0502 05/18/14 0600  BP: 118/42 113/36  94/32  Pulse: 113 90 111 55  Temp: 97.5 F (36.4 C) 97.3 F (36.3 C)  97.5 F (36.4 C)  TempSrc:      Resp: 24 23 24 21   Height:      Weight:      SpO2: 89% 92%  89%   I&O's:    Intake/Output Summary (Last 24 hours) at 05/18/14 0654 Last data filed at 05/18/14 0600  Gross per 24 hour  Intake 2598.16 ml  Output   1366 ml  Net 1232.16 ml   TELEMETRY: Reviewed telemetry pt in AFib, variable rate control.:     PHYSICAL EXAM General: Intubated sedated Head:   Normal cephalic and atramatic  Lungs:  Coarse BS bilaterally Heart:  Irregularly irregular crisp S1 crisp S2 clicks  No JVD.   Abdomen: abdomen soft and non-tender Msk:  Back normal,  Normal strength and tone for age. Extremities:   No edema.   Neuro:Intubated, sedated Psych:  Intubated, sedated Skin: No rash   LABS: Basic Metabolic Panel:  Recent Labs  05/16/14 0338 05/17/14 0500 05/18/14 0400  NA 133* 139 139  K 4.1 4.1 3.7  CL 93* 96 96  CO2 32 31 31  GLUCOSE 118* 123* 128*  BUN 60* 74* 75*  CREATININE 2.00* 2.23* 1.96*  CALCIUM 8.6 8.4 8.7  MG 2.2  --   --   PHOS 3.6  --   --    Liver Function Tests: No results for input(s): AST, ALT, ALKPHOS, BILITOT, PROT, ALBUMIN in the last 72 hours. No results for input(s): LIPASE, AMYLASE in the last 72 hours. CBC:  Recent Labs  05/17/14 0500 05/18/14 0400  WBC 7.1 7.2  HGB 8.5* 8.0*  HCT 27.9* 27.4*  MCV 92.1 91.6  PLT 182 180   Cardiac Enzymes: No results for input(s): CKTOTAL, CKMB, CKMBINDEX, TROPONINI in the last 72 hours. BNP: Invalid input(s): POCBNP D-Dimer: No results for input(s): DDIMER in the last 72 hours. Hemoglobin A1C: No results for input(s): HGBA1C in the last 72 hours. Fasting Lipid  Panel: No results for input(s): CHOL, HDL, LDLCALC, TRIG, CHOLHDL, LDLDIRECT in the last 72 hours. Thyroid Function Tests: No results for input(s): TSH, T4TOTAL, T3FREE, THYROIDAB in the last 72 hours.  Invalid input(s): FREET3 Anemia Panel: No results for input(s): VITAMINB12, FOLATE, FERRITIN, TIBC, IRON, RETICCTPCT in the last 72 hours. Coag Panel:   Lab Results  Component Value Date   INR 1.74* 05/18/2014   INR 1.99* 05/17/2014   INR 2.47* 05/16/2014    RADIOLOGY: Dg Abd 1 View  05/15/2014   CLINICAL DATA:  Orogastric tube placement  EXAM: ABDOMEN - 1 VIEW  COMPARISON:  02/06/2014  FINDINGS: Orogastric tube tip at the proximal stomach. Advancement by 6 cm would provide more stable positioning.  The bowel gas pattern is nonobstructive. Multiple skin or bedding folds are noted over the abdomen. The visualized bowel gas pattern is nonobstructed. Extensive arterial calcification again noted.  IMPRESSION: Orogastric tube tip at the proximal stomach. Advancement by approximately 6 cm would provide more secure positioning.   Electronically Signed   By: Jorje Guild M.D.   On: 05/15/2014 10:22   Dg Chest Port 1 View  05/17/2014   CLINICAL DATA:  Hypoxia  EXAM: PORTABLE CHEST - 1 VIEW  COMPARISON:  May 16, 2014  FINDINGS: Endotracheal tube tip is 6.5 cm above the carina. Central catheter tip is in the superior cava. Nasogastric tube tip and side port are below the diaphragm. No pneumothorax. Cardiomegaly persists. There is diffuse interstitial edema with bilateral effusions. There is increase in opacity behind the left heart, likely due to layering effusion. Valve replacements and coronary artery bypass grafting noted.  IMPRESSION: Persistent congestive heart failure. Suspect larger effusion on the left. There may be some airspace consolidation in the left base as well ; a degree of superimposed pneumonia in the left base currently cannot be excluded. No pneumothorax. Tube and catheter  positions as described.   Electronically Signed   By: Lowella Grip M.D.   On: 05/17/2014 07:16   Dg Chest Port 1 View  05/16/2014   CLINICAL DATA:  Hypoxia  EXAM: PORTABLE CHEST - 1 VIEW  COMPARISON:  May 15, 2014  FINDINGS: Endotracheal tube tip is 2.7 cm above carina. Central catheter tip is in the superior cava near cavoatrial junction. Nasogastric tube tip and side port are below the diaphragm in stomach. No pneumothorax. There remains generalized interstitial edema with patchy atelectatic change in the bases, more on the left than on the right. There is stable cardiomegaly. The pulmonary vascularity is within normal limits. No adenopathy. Patient is status post coronary artery bypass grafting and valve replacements.  IMPRESSION: Findings consistent with congestive heart failure. Left base atelectasis present. Tube and catheter positions as described without pneumothorax.   Electronically Signed   By: Lowella Grip M.D.   On: 05/16/2014 07:03   Dg Chest Port 1 View  05/15/2014   CLINICAL DATA:  Central line placement.  EXAM: PORTABLE CHEST - 1 VIEW  COMPARISON:  Single view of the chest 05/15/2014 at 4:46 a.m.  FINDINGS: New right IJ catheter is in place with the tip projecting over the lower superior vena cava. No pneumothorax is identified. Endotracheal tube is again noted. Extensive bilateral pulmonary edema and small pleural effusions are unchanged.  IMPRESSION: Right IJ catheter tip projects over the lower superior vena cava. Negative for pneumothorax.  No change in bilateral airspace disease most consistent with pulmonary edema.   Electronically Signed   By: Inge Rise M.D.   On: 05/15/2014 06:40   Portable Chest Xray  05/15/2014   CLINICAL DATA:  Status post intubation.  EXAM: PORTABLE CHEST - 1 VIEW  COMPARISON:  Single view of the chest 05/15/2014 at 4:04 a.m.  FINDINGS: New endotracheal tube is in place with the tip in good position just below the clavicular heads.  Cardiomegaly, pulmonary edema and pleural effusions persist without change. No pneumothorax.  IMPRESSION: ET tube projects in good position.  No change in pulmonary edema and pleural effusions.   Electronically Signed   By: Inge Rise M.D.   On: 05/15/2014 05:13   Dg Chest Port 1 View  05/15/2014   CLINICAL DATA:  Hypoxia.  Decreased oxygen saturation.  EXAM: PORTABLE CHEST - 1 VIEW  COMPARISON:  Single view of the chest 05/29/2014 02/08/2014.  FINDINGS: There is extensive bilateral airspace disease and right greater than left pleural effusions. Cardiomegaly is noted. No pneumothorax identified.  IMPRESSION: Findings most consistent with congestive heart failure with associated right greater than left pleural effusions.   Electronically Signed   By: Inge Rise M.D.   On: 05/15/2014 04:16   Dg Chest Portable 1 View  05/11/2014   CLINICAL DATA:  Cough and shortness of breath today. Epistaxis today.  EXAM: PORTABLE CHEST - 1 VIEW  COMPARISON:  02/08/2014.  FINDINGS: There are changes from cardiac surgery and valve replacement. Moderate enlargement of the cardiopericardial silhouette. No mediastinal or hilar masses.  There is vascular congestion interstitial thickening bilaterally. Mild hazy opacity at the lung bases suggests small effusions. No pneumothorax. No focal pneumonia.  IMPRESSION: 1. Findings consistent with congestive heart failure with cardiomegaly, interstitial edema and small effusions.   Electronically Signed   By: Lajean Manes M.D.   On: 05/06/2014 21:52      ASSESSMENT: Kathyrn Lass:   Epistaxis with prosethetic MV and Ao valve.  Now on IV heparin. By exam, valves functioning well.  Difficult situation due to worsening anemia.  No obvious nasal bleeding at this time. Improved renal function today.   AFib: borderline rate control.  COntinue to monitor. IV metoprolol for now.   Acute diastolic heart failure contributing to resp failure.  Difficult to diurese due to renal failure.   CXR reviewed personally by myself shows persistent pulmonary edema and effusions.  Would be in favor of giving a dose of IV Lasix today.  Anemia could partly be dilutional as well.  Will await Dr. Bari Mantis input if he is ok giving Lasix given the question of diuretic induced renal failure.    Jettie Booze., MD  05/18/2014  6:54 AM

## 2014-05-18 NOTE — Plan of Care (Signed)
Problem: Phase I Progression Outcomes Goal: Pain controlled with appropriate interventions Outcome: Progressing     

## 2014-05-18 NOTE — Progress Notes (Signed)
ANTICOAGULATION CONSULT NOTE - Follow Up Consult  Pharmacy Consult for Heparin Indication: mechanical heart valves  No Known Allergies  Patient Measurements: Height: 5\' 6"  (167.6 cm) Weight: 159 lb 13.3 oz (72.5 kg) IBW/kg (Calculated) : 63.8 Heparin Dosing Weight:   Vital Signs: Temp: 97.3 F (36.3 C) (11/13 0500) Temp Source: Core (Comment) (11/13 0200) BP: 113/36 mmHg (11/13 0500) Pulse Rate: 111 (11/13 0502)  Labs:  Recent Labs  05/16/14 0338 05/17/14 0500 05/17/14 1815 05/18/14 0400  HGB 9.0* 8.5*  --  8.0*  HCT 29.4* 27.9*  --  27.4*  PLT 197 182  --  180  LABPROT 26.9* 22.8*  --  20.5*  INR 2.47* 1.99*  --  1.74*  HEPARINUNFRC  --   --  0.23* 0.30  CREATININE 2.00* 2.23*  --  1.96*    Estimated Creatinine Clearance: 32.6 mL/min (by C-G formula based on Cr of 1.96).   Medications:  Infusions:  . heparin    . propofol 40 mcg/kg/min (05/18/14 0246)    Assessment: Patient with heparin at goal but lower end of goal.  No issues with drip per RN.  Goal of Therapy:  Heparin level 0.3-0.7 units/ml Monitor platelets by anticoagulation protocol: Yes   Plan:  Increase heparin to 1300 units/hr Recheck level at [place] 05/18/2014,5:30 AM

## 2014-05-18 NOTE — Progress Notes (Signed)
ANTICOAGULATION CONSULT NOTE - Follow up  Pharmacy Consult for Heparin Indication: mechanical heart valves  No Known Allergies  Patient Measurements: Height: 5\' 6"  (167.6 cm) Weight: 159 lb 13.3 oz (72.5 kg) IBW/kg (Calculated) : 63.8  Vital Signs: Temp: 97.7 F (36.5 C) (11/13 1400) Temp Source: Core (Comment) (11/13 0800) BP: 110/61 mmHg (11/13 1400) Pulse Rate: 84 (11/13 1400)  Labs:  Recent Labs  05/16/14 0338 05/17/14 0500 05/17/14 1815 05/18/14 0400 05/18/14 1300  HGB 9.0* 8.5*  --  8.0*  --   HCT 29.4* 27.9*  --  27.4*  --   PLT 197 182  --  180  --   LABPROT 26.9* 22.8*  --  20.5*  --   INR 2.47* 1.99*  --  1.74*  --   HEPARINUNFRC  --   --  0.23* 0.30 0.45  CREATININE 2.00* 2.23*  --  1.96*  --     Estimated Creatinine Clearance: 32.6 mL/min (by C-G formula based on Cr of 1.96).   Medical History: Past Medical History  Diagnosis Date  . Essential hypertension, benign   . Coronary atherosclerosis of native coronary artery     a. CABG/MVR/AVR-2003 (#25 St. Jude/#21 St. Jude); anticoagulation; b. negative stress nuclear-2005;  c. 01/2013 NSTEMI/Cath/PCI: LM nl, LAD 50p, 40-40m, D1 50ost, LCX 8m, RCA 50/15m (2.5x20 Promus DES), 50d, VG->Diag 100, VG->PDA 100, VG->OM 100, LIMA->LAD ok.  . Epistaxis     Requiring cautery & aterial ligation-09/2009  . GERD (gastroesophageal reflux disease)   . Hyperlipemia   . Abnormal LFTs     Possible cirrhosis  . Chronic anticoagulation   . Valvular heart disease     a. 2003: MVR/AVR-2003 (#25 St. Jude/#21 St. Jude);  b. 01/2013 Echo: EF 55%, Mech AVR mean grad 12, Mech MVR mean grad 6.  . Tobacco abuse     45 pack years  . Fasting hyperglycemia   . Nephrolithiasis   . Diverticulosis   . Hyponatremia   . Chronic diastolic CHF (congestive heart failure)   . Chronic renal disease   . Hemolytic anemia   . ETOH abuse   . Atrial fibrillation 01/16/2013    On coumadin DCCV 07/2013.   Marland Kitchen COPD (chronic obstructive pulmonary  disease)   . Stage III chronic kidney disease    Assessment: 68 y.o. M with PMH of above, including prosthetic MV and Ao valve, atrial fibrillation admitted with epistaxis (hx of frequent nose bleeds). Patient on chronic anticoagulation with warfarin prior to admission. (INR 2.28 upon admissio)   CBC: Hgb 8.5 (trending down), Pltc WNL today (trending down) Baseline PT: 22.8 Baseline INR: 1.99  Significant events: 11/12: HL 0.23 on 1000units/hr, increased to 1200units/hr. 11/13: HL 0.3 on 1200units/hr, increased to 1300units/hr. CBC stable.  Today:  HL 0.45 on 1300units/hr. No bleeding reported/documented per RN.  Goal of Therapy:  Heparin level 0.3-0.7 units/ml (Recommend aiming for lower end of therapeutic (0.3-0.5 units/ml) d/t bleeding risk Monitor platelets by anticoagulation protocol: Yes   Plan:   Cont heparin infusion @ 1300units/hr.  Next heparin level in the am.  Follow CBC, renal function, daily heparin levels.  Romeo Rabon, PharmD, pager [phone]. 05/18/2014,2:29 PM.

## 2014-05-18 NOTE — Progress Notes (Signed)
[facility] ADULT ICU REPLACEMENT PROTOCOL FOR AM LAB REPLACEMENT ONLY  The patient does not apply for the [facility] Adult ICU Electrolyte Replacment Protocol based on the criteria listed below:    Is BUN < 60 mg/dL? No.  Patient's BUN today is 74  Abnormal electrolyte(s):K3.7  If a panic level lab has been reported, has the CCM MD in charge been notified?YES  Physician:  Earl Many 05/18/2014 6:04 AM

## 2014-05-19 ENCOUNTER — Inpatient Hospital Stay (HOSPITAL_COMMUNITY): Payer: Medicare Other

## 2014-05-19 DIAGNOSIS — J9600 Acute respiratory failure, unspecified whether with hypoxia or hypercapnia: Secondary | ICD-10-CM

## 2014-05-19 LAB — BASIC METABOLIC PANEL
ANION GAP: 13 (ref 5–15)
BUN: 71 mg/dL — ABNORMAL HIGH (ref 6–23)
CHLORIDE: 96 meq/L (ref 96–112)
CO2: 31 mEq/L (ref 19–32)
CREATININE: 1.74 mg/dL — AB (ref 0.50–1.35)
Calcium: 8.9 mg/dL (ref 8.4–10.5)
GFR calc non Af Amer: 39 mL/min — ABNORMAL LOW (ref >90)
GFR, EST AFRICAN AMERICAN: 45 mL/min — AB (ref >90)
Glucose, Bld: 114 mg/dL — ABNORMAL HIGH (ref 70–99)
Potassium: 3.6 mEq/L — ABNORMAL LOW (ref 3.7–5.3)
Sodium: 140 mEq/L (ref 137–147)

## 2014-05-19 LAB — GLUCOSE, CAPILLARY
GLUCOSE-CAPILLARY: 116 mg/dL — AB (ref 70–99)
Glucose-Capillary: 129 mg/dL — ABNORMAL HIGH (ref 70–99)
Glucose-Capillary: 130 mg/dL — ABNORMAL HIGH (ref 70–99)
Glucose-Capillary: 139 mg/dL — ABNORMAL HIGH (ref 70–99)
Glucose-Capillary: 145 mg/dL — ABNORMAL HIGH (ref 70–99)
Glucose-Capillary: 117 mg/dL — ABNORMAL HIGH (ref 70–99)

## 2014-05-19 LAB — CBC
HCT: 28.2 % — ABNORMAL LOW (ref 39.0–52.0)
Hemoglobin: 8.5 g/dL — ABNORMAL LOW (ref 13.0–17.0)
MCH: 27.9 pg (ref 26.0–34.0)
MCHC: 30.1 g/dL (ref 30.0–36.0)
MCV: 92.5 fL (ref 78.0–100.0)
Platelets: 179 10*3/uL (ref 150–400)
RBC: 3.05 MIL/uL — ABNORMAL LOW (ref 4.22–5.81)
RDW: 18.5 % — ABNORMAL HIGH (ref 11.5–15.5)
WBC: 6.7 10*3/uL (ref 4.0–10.5)

## 2014-05-19 LAB — HEPARIN LEVEL (UNFRACTIONATED): HEPARIN UNFRACTIONATED: 0.51 [IU]/mL (ref 0.30–0.70)

## 2014-05-19 MED ORDER — DEXTROSE 5 % IV SOLN
1.0000 g | INTRAVENOUS | Status: AC
  Administered 2014-05-19 – 2014-05-25 (×7): 1 g via INTRAVENOUS
  Filled 2014-05-19 (×6): qty 10

## 2014-05-19 MED ORDER — FUROSEMIDE 10 MG/ML IJ SOLN
40.0000 mg | Freq: Two times a day (BID) | INTRAMUSCULAR | Status: DC
  Administered 2014-05-19 – 2014-05-20 (×3): 40 mg via INTRAVENOUS
  Filled 2014-05-19 (×3): qty 4

## 2014-05-19 NOTE — Progress Notes (Signed)
ANTICOAGULATION CONSULT NOTE - Follow up  Pharmacy Consult for Heparin Indication: mechanical heart valves  No Known Allergies  Patient Measurements: Height: 5\' 6"  (167.6 cm) Weight: 162 lb 7.7 oz (73.7 kg) IBW/kg (Calculated) : 63.8  Vital Signs: Temp: 96.8 F (36 C) (11/14 0700) Temp Source: Core (Comment) (11/14 0500) BP: 121/41 mmHg (11/14 0700) Pulse Rate: 96 (11/14 0700)  Labs:  Recent Labs  05/17/14 0500  05/18/14 0400 05/18/14 1300 05/19/14 0200 05/19/14 0255  HGB 8.5*  --  8.0*  --  8.5*  --   HCT 27.9*  --  27.4*  --  28.2*  --   PLT 182  --  180  --  179  --   LABPROT 22.8*  --  20.5*  --   --   --   INR 1.99*  --  1.74*  --   --   --   HEPARINUNFRC  --   < > 0.30 0.45  --  0.51  CREATININE 2.23*  --  1.96*  --  1.74*  --   < > = values in this interval not displayed.  Estimated Creatinine Clearance: 36.7 mL/min (by C-G formula based on Cr of 1.74).   Medical History: Past Medical History  Diagnosis Date  . Essential hypertension, benign   . Coronary atherosclerosis of native coronary artery     a. CABG/MVR/AVR-2003 (#25 St. Jude/#21 St. Jude); anticoagulation; b. negative stress nuclear-2005;  c. 01/2013 NSTEMI/Cath/PCI: LM nl, LAD 50p, 40-51m, D1 50ost, LCX 89m, RCA 50/2m (2.5x20 Promus DES), 50d, VG->Diag 100, VG->PDA 100, VG->OM 100, LIMA->LAD ok.  . Epistaxis     Requiring cautery & aterial ligation-09/2009  . GERD (gastroesophageal reflux disease)   . Hyperlipemia   . Abnormal LFTs     Possible cirrhosis  . Chronic anticoagulation   . Valvular heart disease     a. 2003: MVR/AVR-2003 (#25 St. Jude/#21 St. Jude);  b. 01/2013 Echo: EF 55%, Mech AVR mean grad 12, Mech MVR mean grad 6.  . Tobacco abuse     45 pack years  . Fasting hyperglycemia   . Nephrolithiasis   . Diverticulosis   . Hyponatremia   . Chronic diastolic CHF (congestive heart failure)   . Chronic renal disease   . Hemolytic anemia   . ETOH abuse   . Atrial fibrillation  01/16/2013    On coumadin DCCV 07/2013.   Marland Kitchen COPD (chronic obstructive pulmonary disease)   . Stage III chronic kidney disease    Assessment: 68 y.o. M with PMH of above, including prosthetic MV and Ao valve, atrial fibrillation admitted with epistaxis (hx of frequent nose bleeds). Patient on chronic anticoagulation with warfarin prior to admission. (INR 2.28 upon admissio)   CBC: Hgb 8.5 (trending down), Pltc WNL today (trending down) Baseline PT: 22.8 Baseline INR: 1.99  Significant events: 11/12: HL 0.23 on 1000units/hr, increased to 1200units/hr. 11/13: INR 1.74. HL 0.3 on 1200units/hr, increased to 1300units/hr. CBC stable. HL 0.45 on 1300 units/hr.  11/14: HL 0.51 on 1300units/hr, trending up. No bleeding reported/documented. CBC stable. SCr improving.  Goal of Therapy:  Heparin level 0.3-0.7 units/ml (Recommend aiming for lower end of therapeutic (0.3-0.5 units/ml) d/t bleeding risk Monitor platelets by anticoagulation protocol: Yes   Plan:   Decrease heparin infusion to 1200units/hr.  Next heparin level in the am.  Follow CBC, renal function, daily heparin levels.  Romeo Rabon, PharmD, pager [phone]. 05/19/2014,7:20 AM.

## 2014-05-19 NOTE — Plan of Care (Signed)
Problem: Phase I Progression Outcomes Goal: Pain controlled with appropriate interventions Outcome: Progressing     

## 2014-05-19 NOTE — Plan of Care (Signed)
Problem: Phase I Progression Outcomes Goal: Hemodynamically stable Outcome: Progressing     

## 2014-05-19 NOTE — Progress Notes (Signed)
PULMONARY / CRITICAL CARE MEDICINE   Name: [name] MRN: [id number] DOB: 13-Jul-1945    ADMISSION DATE:  05/07/2014 CONSULTATION DATE:  05/19/2014  REFERRING MD :  Rama  CHIEF COMPLAINT:  Respiratory failure in setting epistaxis  INITIAL PRESENTATION:  68 y.o. M brought to [facility] 11/8 with epistaxis.  Had rhinopack placed with temporary cessation of bleeding.  Seen by ENT 11/9 and had surgicel packing placed with apparent cessation of bleeding.  In early AM hours 11/10, developed worsening dyspnea despite NRB mask.  He was transferred to the ICU and intubated by CRNA for resp acidosis.    PMH - chronic 2L O2, frequent nosebleeds due to being on warfarin and plavix for hx of 2 mechanical heart valves.    STUDIES:  None  SIGNIFICANT EVENTS: 11/8 - admitted 11/9 - ENT consult 11/10 - transferred to ICU, intubated 11/14- weaning  SUBJECTIVE:  Weaned well day prior   VITAL SIGNS: Temp:  [96.6 F (35.9 C)-97.9 F (36.6 C)] 97.2 F (36.2 C) (11/14 0800) Pulse Rate:  [45-115] 103 (11/14 0800) Resp:  [18-31] 29 (11/14 0800) BP: (95-155)/(38-99) 109/52 mmHg (11/14 0800) SpO2:  [85 %-99 %] 97 % (11/14 0800) FiO2 (%):  [40 %-50 %] 50 % (11/14 0743) Weight:  [73.7 kg (162 lb 7.7 oz)] 73.7 kg (162 lb 7.7 oz) (11/14 0340) HEMODYNAMICS:   VENTILATOR SETTINGS: Vent Mode:  [-] CPAP;PSV FiO2 (%):  [40 %-50 %] 50 % Set Rate:  [24 bmp] 24 bmp Vt Set:  [380 mL] 380 mL PEEP:  [5 cmH20] 5 cmH20 Pressure Support:  [8 cmH20] 8 cmH20 Plateau Pressure:  [11 cmH20-14 cmH20] 13 cmH20 INTAKE / OUTPUT: Intake/Output      11/13 0701 - 11/14 0700 11/14 0701 - 11/15 0700   I.V. (mL/kg) 972.9 (13.2) 46.8 (0.6)   NG/GT 1530 60   IV Piggyback 150    Total Intake(mL/kg) 2652.9 (36) 106.8 (1.4)   Urine (mL/kg/hr) 1375 (0.8) 105 (0.7)   Stool     Total Output 1375 105   Net +1277.9 +1.8        Stool Occurrence 1 x      PHYSICAL EXAMINATION: General: Elderly male, chronically ill appearing,  calm Neuro: Sedated on vent., rass 0 HEENT: [place]/AT. PERRL, sclerae anicteric.  B/l nares packed, dried blood noted. Cardiovascular: IRIR, SEM unchanged Lungs: CTA, good air entry Abdomen: BS x 4, soft, NT/ND.  Musculoskeletal: No gross deformities, no edema.  Skin: Multiple small skin excoriations with scabs, multiple bruises, warm, diffuse erythematous rash.  LABS:  CBC  Recent Labs Lab 05/17/14 0500 05/18/14 0400 05/19/14 0200  WBC 7.1 7.2 6.7  HGB 8.5* 8.0* 8.5*  HCT 27.9* 27.4* 28.2*  PLT 182 180 179   Coag's  Recent Labs Lab 05/16/14 0338 05/17/14 0500 05/18/14 0400  INR 2.47* 1.99* 1.74*   BMET  Recent Labs Lab 05/17/14 0500 05/18/14 0400 05/19/14 0200  NA 139 139 140  K 4.1 3.7 3.6*  CL 96 96 96  CO2 31 31 31   BUN 74* 75* 71*  CREATININE 2.23* 1.96* 1.74*  GLUCOSE 123* 128* 114*   Electrolytes  Recent Labs Lab 05/16/14 0338 05/17/14 0500 05/18/14 0400 05/19/14 0200  CALCIUM 8.6 8.4 8.7 8.9  MG 2.2  --   --   --   PHOS 3.6  --   --   --    Sepsis Markers  Recent Labs Lab 05/15/14 0515 05/16/14 0348 05/17/14 0500  PROCALCITON <0.10 0.34 0.34  ABG  Recent Labs Lab 05/15/14 1728 05/17/14 0350 05/18/14 1013  PHART 7.406 7.387 7.411  PCO2ART 52.1* 51.2* 48.9*  PO2ART 55.4* 66.7* 54.9*   Liver Enzymes No results for input(s): AST, ALT, ALKPHOS, BILITOT, ALBUMIN in the last 168 hours. Cardiac Enzymes  Recent Labs Lab 05/24/2014 2157  TROPONINI <0.30  PROBNP 13471.0*   Glucose  Recent Labs Lab 05/17/14 2359 05/18/14 0440 05/18/14 0747 05/18/14 1132 05/18/14 1556 05/18/14 1915  GLUCAP 146* 160* 126* 143* 132* 135*    Imaging Dg Chest Port 1 View  05/19/2014   CLINICAL DATA:  ACUTE RESPIRATORY FAILURE.  EXAM: PORTABLE CHEST - 1 VIEW  COMPARISON:  ONE-VIEW CHEST 11/13/ 15  FINDINGS: The heart is enlarged. The patient remains intubated. A right IJ line is stable in position. The NG tube courses off the inferior border  the film. Atherosclerotic changes are noted in the arch and bilateral axillary arteries. Moderate diffuse interstitial edema is slightly improved. Bilateral pleural effusions are noted. A basilar airspace disease is evident.  IMPRESSION: 1. Slight improvement in diffuse interstitial edema and bilateral pleural effusions. Congestive heart failure. 2. Stable cardiomegaly. 3. Support apparatus is stable.   Electronically Signed   By: Lawrence Santiago M.D.   On: 05/19/2014 07:09   Dg Chest Port 1 View  05/18/2014   CLINICAL DATA:  Respiratory failure.  EXAM: PORTABLE CHEST - 1 VIEW  COMPARISON:  05/2014.  FINDINGS: Endotracheal tube, NG tube, right IJ line in stable position. Stable cardiomegaly. Prior replacement cardiac valves and CABG. Persistent cardiomegaly with bilateral persistent pulmonary infiltrates and pleural effusions consistent with congestive heart failure. No significant interval change. No pneumothorax.  IMPRESSION: 1. Lines and tubes in stable position. 2. Prior replacement cardiac valves and CABG. Persistent congestive heart failure with bilateral pulmonary edema and pleural effusions, no significant change .   Electronically Signed   By: Marcello Moores  Register   On: 05/18/2014 07:40     ASSESSMENT / PLAN:  PULMONARY OETT 11/10 >>> A: Acute on chronic hypoxic respiratory failure ARDS COPD without evidence of exacerbation Likely blood aspiration rather than Pulmonary edema Small b/l pleural effusions Tobacco use  P:   SBT will reduce PS 8 to 5, continued wean x 30 min, assess rsbi pcxr may lag behind clinical progress ARDS protocol keep plat less 30  VAP bundle. DuoNebs / Albuterol. Tobacco cessation.  CARDIOVASCULAR A:  A.fib rate controlled Hx mechanical MVR and AVR in 2003 Chronic anticoagulation due to above Acute diastolic CHF Hx  HTN, HLD P:  PO antihypertensives / diuretics discontinued for now. Coumadin / Plavix currently on hold -does he need plavix given recurrent  nose bleeds?  Hep tolerated thus far  RENAL A:  AKI ? Diuretic induced slow progress CKD - stable Hyponatremia P:   NS @ KVO. BMP in AM. Would favor neg balance, lasix to neg balance  GASTROINTESTINAL A:   GERD Nutrition P:   Pantoprazole. NPO as weaning well  HEMATOLOGIC A:   Chronic anemia On chronic anticoagulation VTE Prophylaxis P:  Transfuse for Hgb < 7. SCD's only. Daily INR - (warfarin and plavix have been held since admit). IV heparin   INFECTIOUS A:   Concern for aspiration PNA P:   Sputum Cx 11/10 >>>ng  Abx: Vanc, start date 11/10 >> 11/11 Abx: Zosyn, start date 11/10>>>11/14 Add ceftriaxone 11/14>>>plan total 7 days  ENDOCRINE A:   No known issues P:   Monitor glucose on BMP.  NEUROLOGIC A:   Acute metabolic encephalopathy  ETOH abuse P:   Sedation:  Propofol gtt / Fentanyl PRN. RASS goal: 0 to -1. Daily WUA. Thiamine / Folate / Multivitamin.  Rash  -resolved  Family updated: s-in law HCPOA - updated 11/12  Interdisciplinary Family Meeting v Palliative Care Meeting:  Due by: 11/17.  Summary - Improving, lasix to neg balance, weaning  The patient is critically ill with multiple organ systems failure and requires high complexity decision making for assessment and support, frequent evaluation and titration of therapies, application of advanced monitoring technologies and extensive interpretation of multiple databases. Critical Care Time devoted to patient care services described in this note is 35 minutes.   Lavon Paganini. Titus Mould, MD, [place] Pgr: [facility]

## 2014-05-19 NOTE — Progress Notes (Signed)
Patient bleeding from meatus of penis.  Urine is not bloody looking.  Called Dr. Titus Mould to notify about bleeding.  Heparin turned off at 1200 noon per order.  Asked doctor if wanted to restart Heparin drip later in day, and he said no, do not restart Heparin today.  Continue to monitor for bleeding.  [place] Roselie Awkward RN

## 2014-05-19 NOTE — Progress Notes (Signed)
ANTIBIOTIC CONSULT NOTE - Follow up  Pharmacy Consult for Zosyn Indication: suspected aspiration PNA  No Known Allergies  Patient Measurements: Height: 5\' 6"  (167.6 cm) Weight: 162 lb 7.7 oz (73.7 kg) IBW/kg (Calculated) : 63.8   Vital Signs: Temp: 96.8 F (36 C) (11/14 0700) Temp Source: Core (Comment) (11/14 0500) BP: 121/41 mmHg (11/14 0700) Pulse Rate: 96 (11/14 0700) Intake/Output from previous day: 11/13 0701 - 11/14 0700 In: 2552.8 [I.V.:932.8; NG/GT:1470; IV Piggyback:150] Out: 0881 [Urine:1375] Intake/Output from this shift:    Labs:  Recent Labs  05/17/14 0500 05/18/14 0400 05/19/14 0200  WBC 7.1 7.2 6.7  HGB 8.5* 8.0* 8.5*  PLT 182 180 179  CREATININE 2.23* 1.96* 1.74*   Estimated Creatinine Clearance: 36.7 mL/min (by C-G formula based on Cr of 1.74). No results for input(s): VANCOTROUGH, VANCOPEAK, VANCORANDOM, GENTTROUGH, GENTPEAK, GENTRANDOM, TOBRATROUGH, TOBRAPEAK, TOBRARND, AMIKACINPEAK, AMIKACINTROU, AMIKACIN in the last 72 hours.   Microbiology: Recent Results (from the past 720 hour(s))  MRSA PCR Screening     Status: None   Collection Time: 05/15/14  7:39 AM  Result Value Ref Range Status   MRSA by PCR NEGATIVE NEGATIVE Final    Comment:        The GeneXpert MRSA Assay (FDA approved for NASAL specimens only), is one component of a comprehensive MRSA colonization surveillance program. It is not intended to diagnose MRSA infection nor to guide or monitor treatment for MRSA infections.   Culture, respiratory (NON-Expectorated)     Status: None   Collection Time: 05/15/14  4:30 PM  Result Value Ref Range Status   Specimen Description TRACHEAL ASPIRATE  Final   Special Requests NONE  Final   Gram Stain   Final    MODERATE WBC PRESENT, PREDOMINANTLY PMN RARE SQUAMOUS EPITHELIAL CELLS PRESENT NO ORGANISMS SEEN Performed at [facility]    Culture   Final    NO GROWTH 2 DAYS Performed at [facility]    Report Status  05/17/2014 FINAL  Final    Assessment: 68 yo admitted with uncontrolled nosebleed, [name] aspiration pneumonia. Now Zosyn and Vancomycin per Rx for PNA.   11/10 >> vancomycin >> 11/11 11/10 >> zosyn >>  Tmax: AF WBCs: WNL Renal: elev but improving, 37CG PCT: 0.34(11/12)  11/10 sputum: neg F 11/10 MRSA: neg  Goal of Therapy:  Appropriate antibiotic dosing for renal function; eradication of infection  Plan:   Continue Zosyn 3.375g IV Q8H infused over 4hrs. Duration?  Follow up renal fxn and culture results.  Romeo Rabon, PharmD, pager [phone]. 05/19/2014,7:33 AM.

## 2014-05-19 NOTE — Plan of Care (Signed)
Problem: Phase I Progression Outcomes Goal: Hemodynamically stable Outcome: Completed/Met Date Met:  05/19/14 Goal: Other Phase I Outcomes/Goals Outcome: Completed/Met Date Met:  05/19/14

## 2014-05-19 NOTE — Progress Notes (Signed)
   SUBJECTIVE:    68 yo with hx of mechanical MVR and AVR.  Chronic afib Admitted with epistaxis.  We were asked to see for help in management of anticoagulation.   OBJECTIVE:   Vitals:   Filed Vitals:   05/19/14 0600 05/19/14 0700 05/19/14 0743 05/19/14 0800  BP: 107/43 121/41  109/52  Pulse: 105 96 97 103  Temp: 96.8 F (36 C) 96.8 F (36 C)  97.2 F (36.2 C)  TempSrc:      Resp: 31 31 28 29   Height:      Weight:      SpO2: 96% 97% 97% 97%   I&O's:    Intake/Output Summary (Last 24 hours) at 05/19/14 0941 Last data filed at 05/19/14 3734  Gross per 24 hour  Intake 2633.72 ml  Output   1355 ml  Net 1278.72 ml   TELEMETRY: Reviewed telemetry pt in AFib,  Occasional PVCs     PHYSICAL EXAM General: Intubated sedated Head:   Normal cephalic and atramatic  Lungs:  Coarse BS bilaterally Heart:  Irregularly irregular crisp S1 crisp S2 clicks  No JVD.   Abdomen: abdomen soft   Msk:  Back normal,   . Extremities:   No edema.   Neuro:Intubated, sedated Psych:  Intubated, sedated Skin: No rash   LABS: Basic Metabolic Panel:  Recent Labs  05/18/14 0400 05/19/14 0200  NA 139 140  K 3.7 3.6*  CL 96 96  CO2 31 31  GLUCOSE 128* 114*  BUN 75* 71*  CREATININE 1.96* 1.74*  CALCIUM 8.7 8.9   Liver Function Tests: No results for input(s): AST, ALT, ALKPHOS, BILITOT, PROT, ALBUMIN in the last 72 hours. No results for input(s): LIPASE, AMYLASE in the last 72 hours. CBC:  Recent Labs  05/18/14 0400 05/19/14 0200  WBC 7.2 6.7  HGB 8.0* 8.5*  HCT 27.4* 28.2*  MCV 91.6 92.5  PLT 180 179   Cardiac Enzymes: No results for input(s): CKTOTAL, CKMB, CKMBINDEX, TROPONINI in the last 72 hours. BNP: Invalid input(s): POCBNP D-Dimer: No results for input(s): DDIMER in the last 72 hours. Hemoglobin A1C: No results for input(s): HGBA1C in the last 72 hours. Fasting Lipid Panel:  Recent Labs  05/18/14 0400  TRIG 96   Thyroid Function Tests: No results for  input(s): TSH, T4TOTAL, T3FREE, THYROIDAB in the last 72 hours.  Invalid input(s): FREET3 Anemia Panel: No results for input(s): VITAMINB12, FOLATE, FERRITIN, TIBC, IRON, RETICCTPCT in the last 72 hours. 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   Continue heparin.   INR was normal on admission.  Difficult to say what caused the epistaxis Agree with heparin for now. Transition to coumadin soon.  AFib: borderline rate control.  COntinue to monitor. IV metoprolol for now.   Acute diastolic heart failure contributing to resp failure.   Agree with lasix in an attempt to extubate tomorrow Echo in Jan. 2015 shows normal LV EF  Of 55%.    Darden Amber., MD  05/19/2014  9:41 AM

## 2014-05-20 ENCOUNTER — Encounter (HOSPITAL_COMMUNITY): Payer: Self-pay

## 2014-05-20 ENCOUNTER — Inpatient Hospital Stay (HOSPITAL_COMMUNITY): Payer: Medicare Other

## 2014-05-20 DIAGNOSIS — J9602 Acute respiratory failure with hypercapnia: Secondary | ICD-10-CM | POA: Insufficient documentation

## 2014-05-20 DIAGNOSIS — I482 Chronic atrial fibrillation: Secondary | ICD-10-CM

## 2014-05-20 DIAGNOSIS — E872 Acidosis: Secondary | ICD-10-CM | POA: Insufficient documentation

## 2014-05-20 LAB — BLOOD GAS, ARTERIAL
Acid-Base Excess: 2.8 mmol/L — ABNORMAL HIGH (ref 0.0–2.0)
Bicarbonate: 27.1 mEq/L — ABNORMAL HIGH (ref 20.0–24.0)
DRAWN BY: 308601
FIO2: 0.4 %
LHR: 18 {breaths}/min
O2 Saturation: 82.9 %
PATIENT TEMPERATURE: 99
PCO2 ART: 43.4 mmHg (ref 35.0–45.0)
PEEP/CPAP: 5 cmH2O
TCO2: 25.5 mmol/L (ref 0–100)
VT: 380 mL
pH, Arterial: 7.413 (ref 7.350–7.450)
pO2, Arterial: 52.4 mmHg — ABNORMAL LOW (ref 80.0–100.0)

## 2014-05-20 LAB — GLUCOSE, CAPILLARY
GLUCOSE-CAPILLARY: 128 mg/dL — AB (ref 70–99)
Glucose-Capillary: 120 mg/dL — ABNORMAL HIGH (ref 70–99)
Glucose-Capillary: 143 mg/dL — ABNORMAL HIGH (ref 70–99)
Glucose-Capillary: 106 mg/dL — ABNORMAL HIGH (ref 70–99)
Glucose-Capillary: 156 mg/dL — ABNORMAL HIGH (ref 70–99)
Glucose-Capillary: 129 mg/dL — ABNORMAL HIGH (ref 70–99)

## 2014-05-20 LAB — COMPREHENSIVE METABOLIC PANEL
ALT: 41 U/L (ref 0–53)
AST: 75 U/L — AB (ref 0–37)
Albumin: 2.5 g/dL — ABNORMAL LOW (ref 3.5–5.2)
Alkaline Phosphatase: 167 U/L — ABNORMAL HIGH (ref 39–117)
Anion gap: 12 (ref 5–15)
BUN: 75 mg/dL — ABNORMAL HIGH (ref 6–23)
CALCIUM: 9.1 mg/dL (ref 8.4–10.5)
CO2: 32 meq/L (ref 19–32)
CREATININE: 1.66 mg/dL — AB (ref 0.50–1.35)
Chloride: 99 mEq/L (ref 96–112)
GFR, EST AFRICAN AMERICAN: 47 mL/min — AB (ref >90)
GFR, EST NON AFRICAN AMERICAN: 41 mL/min — AB (ref >90)
Glucose, Bld: 130 mg/dL — ABNORMAL HIGH (ref 70–99)
Potassium: 3.6 mEq/L — ABNORMAL LOW (ref 3.7–5.3)
Sodium: 143 mEq/L (ref 137–147)
TOTAL PROTEIN: 7.1 g/dL (ref 6.0–8.3)
Total Bilirubin: 0.6 mg/dL (ref 0.3–1.2)

## 2014-05-20 LAB — CBC WITH DIFFERENTIAL/PLATELET
BASOS PCT: 0 % (ref 0–1)
Basophils Absolute: 0 10*3/uL (ref 0.0–0.1)
EOS PCT: 6 % — AB (ref 0–5)
Eosinophils Absolute: 0.4 10*3/uL (ref 0.0–0.7)
HCT: 26.9 % — ABNORMAL LOW (ref 39.0–52.0)
HEMOGLOBIN: 8.1 g/dL — AB (ref 13.0–17.0)
Lymphocytes Relative: 7 % — ABNORMAL LOW (ref 12–46)
Lymphs Abs: 0.5 10*3/uL — ABNORMAL LOW (ref 0.7–4.0)
MCH: 28 pg (ref 26.0–34.0)
MCHC: 30.1 g/dL (ref 30.0–36.0)
MCV: 93.1 fL (ref 78.0–100.0)
MONOS PCT: 6 % (ref 3–12)
Monocytes Absolute: 0.4 10*3/uL (ref 0.1–1.0)
NEUTROS PCT: 81 % — AB (ref 43–77)
Neutro Abs: 5.6 10*3/uL (ref 1.7–7.7)
Platelets: 204 10*3/uL (ref 150–400)
RBC: 2.89 MIL/uL — ABNORMAL LOW (ref 4.22–5.81)
RDW: 19 % — ABNORMAL HIGH (ref 11.5–15.5)
WBC: 6.9 10*3/uL (ref 4.0–10.5)

## 2014-05-20 LAB — CLOSTRIDIUM DIFFICILE BY PCR: Toxigenic C. Difficile by PCR: NEGATIVE

## 2014-05-20 LAB — HEPARIN LEVEL (UNFRACTIONATED): Heparin Unfractionated: 0.33 IU/mL (ref 0.30–0.70)

## 2014-05-20 MED ORDER — FUROSEMIDE 10 MG/ML IJ SOLN
80.0000 mg | Freq: Two times a day (BID) | INTRAMUSCULAR | Status: DC
Start: 2014-05-20 — End: 2014-05-20

## 2014-05-20 MED ORDER — FUROSEMIDE 10 MG/ML IJ SOLN
80.0000 mg | Freq: Two times a day (BID) | INTRAMUSCULAR | Status: AC
  Administered 2014-05-20 – 2014-05-21 (×4): 80 mg via INTRAVENOUS
  Filled 2014-05-20 (×4): qty 8

## 2014-05-20 MED ORDER — POTASSIUM CHLORIDE 20 MEQ/15ML (10%) PO SOLN
40.0000 meq | Freq: Once | ORAL | Status: AC
  Administered 2014-05-20: 40 meq
  Filled 2014-05-20: qty 30

## 2014-05-20 MED ORDER — PANTOPRAZOLE SODIUM 40 MG PO TBEC: 40.0000 mg | DELAYED_RELEASE_TABLET | Freq: Every day | ORAL | Status: DC

## 2014-05-20 MED ORDER — HEPARIN (PORCINE) IN NACL 100-0.45 UNIT/ML-% IJ SOLN
1200.0000 [IU]/h | INTRAMUSCULAR | Status: DC
  Administered 2014-05-20 – 2014-05-26 (×8): 1200 [IU]/h via INTRAVENOUS
  Filled 2014-05-20 (×12): qty 250

## 2014-05-20 NOTE — Progress Notes (Signed)
ANTICOAGULATION CONSULT NOTE - Follow up  Pharmacy Consult for Heparin Indication: mechanical heart valves and A.fib  No Known Allergies  Patient Measurements: Height: 5\' 6"  (167.6 cm) Weight: 164 lb 3.9 oz (74.5 kg) IBW/kg (Calculated) : 63.8  Vital Signs: Temp: 98.2 F (36.8 C) (11/15 0600) Temp Source: Core (Comment) (11/15 0600) BP: 132/56 mmHg (11/15 0748) Pulse Rate: 93 (11/15 0748)  Labs:  Recent Labs  05/18/14 0400 05/18/14 1300 05/19/14 0200 05/19/14 0255 05/20/14 0400  HGB 8.0*  --  8.5*  --  8.1*  HCT 27.4*  --  28.2*  --  26.9*  PLT 180  --  179  --  204  LABPROT 20.5*  --   --   --   --   INR 1.74*  --   --   --   --   HEPARINUNFRC 0.30 0.45  --  0.51  --   CREATININE 1.96*  --  1.74*  --  1.66*    Estimated Creatinine Clearance: 38.4 mL/min (by C-G formula based on Cr of 1.66).  Assessment: 68 y.o. M with PMH of above, including prosthetic MV and Ao valve, atrial fibrillation admitted with epistaxis (hx of frequent nose bleeds). Patient on chronic anticoagulation with warfarin prior to admission. (INR 2.28 upon admissio)   CBC: Hgb 8.5 (trending down), Pltc WNL today (trending down) Baseline PT: 22.8 Baseline INR: 1.99  Significant events: 11/12: HL 0.23 on 1000units/hr, increased to 1200units/hr. 11/13: INR 1.74. HL 0.3 on 1200units/hr, increased to 1300units/hr. CBC stable. HL 0.45 on 1300 units/hr. 11/14: HL 0.51 on 1300units/hr, decreased to 1200units/hr. Bleeding from penis so heparin stopped.  11/15: No further bleeding. Heparin ordered to resume by cardiology. CBC stable.  Goal of Therapy:  Heparin level 0.3-0.7 units/ml (Aiming for lower end(0.3-0.5 units/ml) d/t bleeding risk) Monitor platelets by anticoagulation protocol: Yes   Plan:   Resume heparin at 1200units/hr. No bolus.  Check heparin level in 8 hrs.  Follow CBC, renal function, daily heparin levels.  Romeo Rabon, PharmD, pager [phone]. 05/20/2014,9:09 AM.

## 2014-05-20 NOTE — Progress Notes (Signed)
   SUBJECTIVE:    68 yo with hx of mechanical MVR and AVR.  Chronic afib Admitted with epistaxis.  We were asked to see for help in management of anticoagulation. He had some bleeding from his penis yesterday and the heparin was stopped.   OBJECTIVE:   Vitals:   Filed Vitals:   05/20/14 0445 05/20/14 0500 05/20/14 0600 05/20/14 0748  BP: 120/46 123/78 102/39 132/56  Pulse:  93 119 93  Temp:  98.6 F (37 C) 98.2 F (36.8 C)   TempSrc:   Core (Comment)   Resp:   25 32  Height:      Weight:   164 lb 3.9 oz (74.5 kg)   SpO2:  92% 91% 96%   I&O's:    Intake/Output Summary (Last 24 hours) at 05/20/14 0855 Last data filed at 05/20/14 0600  Gross per 24 hour  Intake 2032.1 ml  Output   1335 ml  Net  697.1 ml   TELEMETRY: Reviewed telemetry pt in AFib,  Occasional PVCs     PHYSICAL EXAM General: Intubated , opens eyes Head:   Normal cephalic and atramatic  Lungs:  Coarse BS bilaterally Heart:  Irregularly irregular crisp S1 crisp S2 clicks  No JVD.   Abdomen: abdomen soft   Msk:  Back normal,   . Extremities:   No edema.   Neuro:Intubated, sedated Psych:  Intubated, sedated Skin: No rash No further bleeding from his penis  LABS: Basic Metabolic Panel:  Recent Labs  05/19/14 0200 05/20/14 0400  NA 140 143  K 3.6* 3.6*  CL 96 99  CO2 31 32  GLUCOSE 114* 130*  BUN 71* 75*  CREATININE 1.74* 1.66*  CALCIUM 8.9 9.1   Liver Function Tests:  Recent Labs  05/20/14 0400  AST 75*  ALT 41  ALKPHOS 167*  BILITOT 0.6  PROT 7.1  ALBUMIN 2.5*   No results for input(s): LIPASE, AMYLASE in the last 72 hours. CBC:  Recent Labs  05/19/14 0200 05/20/14 0400  WBC 6.7 6.9  NEUTROABS  --  5.6  HGB 8.5* 8.1*  HCT 28.2* 26.9*  MCV 92.5 93.1  PLT 179 204   Cardiac Enzymes: No results for input(s): CKTOTAL, CKMB, CKMBINDEX, TROPONINI in the last 72 hours. BNP: Invalid input(s): POCBNP D-Dimer: No results for input(s): DDIMER in the last 72  hours. Hemoglobin A1C: No results for input(s): HGBA1C in the last 72 hours. Fasting Lipid Panel:  Recent Labs  05/18/14 0400  TRIG 96   Thyroid Function Tests: No results for input(s): TSH, T4TOTAL, T3FREE, THYROIDAB in the last 72 hours.  Invalid input(s): FREET3 Anemia Panel: No results for input(s): VITAMINB12, FOLATE, FERRITIN, TIBC, IRON, RETICCTPCT in the last 72 hours. Coag Panel:   Lab Results  Component Value Date   INR 1.74* 05/18/2014   INR 1.99* 05/17/2014   INR 2.47* 05/16/2014    RADIOLOGY:     ASSESSMENT: Kathyrn Lass:   1. Mechanical AVR and MVR:  He needs to be restarted back on Heparin. No evidence of further bleeding at this point. Will have pharmacy restart.   AFib: borderline rate control.  COntinue to monitor. IV metoprolol for now.   Acute diastolic heart failure contributing to resp failure.   Agree with lasix in an attempt to extubate tomorrow Echo in Jan. 2015 shows normal LV EF  Of 55%.    Darden Amber., MD  05/20/2014  8:55 AM

## 2014-05-20 NOTE — Progress Notes (Signed)
PULMONARY / CRITICAL CARE MEDICINE   Name: [name] MRN: [id number] DOB: 10-Nov-1945    ADMISSION DATE:  05/15/2014 CONSULTATION DATE:  05/20/2014  REFERRING MD :  Rama  CHIEF COMPLAINT:  Respiratory failure in setting epistaxis  INITIAL PRESENTATION:  68 y.o. M brought to [facility] 11/8 with epistaxis.  Had rhinopack placed with temporary cessation of bleeding.  Seen by ENT 11/9 and had surgicel packing placed with apparent cessation of bleeding.  In early AM hours 11/10, developed worsening dyspnea despite NRB mask.  He was transferred to the ICU and intubated by CRNA for resp acidosis.    PMH - chronic 2L O2, frequent nosebleeds due to being on warfarin and plavix for hx of 2 mechanical heart valves.    STUDIES:  None  SIGNIFICANT EVENTS: 11/8 - admitted 11/9 - ENT consult 11/10 - transferred to ICU, intubated 11/14- weaning, blood urine, hep held  SUBJECTIVE:  Weaned but failed from rr up with [place]: Temp:  [97.7 F (36.5 C)-100 F (37.8 C)] 98.1 F (36.7 C) (11/15 1000) Pulse Rate:  [32-119] 102 (11/15 1000) Resp:  [20-35] 26 (11/15 1000) BP: (70-140)/(30-78) 118/61 mmHg (11/15 1000) SpO2:  [91 %-99 %] 91 % (11/15 1000) FiO2 (%):  [40 %-50 %] 40 % (11/15 0855) Weight:  [74.5 kg (164 lb 3.9 oz)] 74.5 kg (164 lb 3.9 oz) (11/15 0600) HEMODYNAMICS:   VENTILATOR SETTINGS: Vent Mode:  [-] PRVC FiO2 (%):  [40 %-50 %] 40 % Set Rate:  [24 bmp] 24 bmp Vt Set:  [380 mL] 380 mL PEEP:  [5 cmH20] 5 cmH20 Pressure Support:  [5 cmH20-8 cmH20] 5 cmH20 Plateau Pressure:  [12 cmH20-23 cmH20] 12 cmH20 INTAKE / OUTPUT: Intake/Output      11/14 0701 - 11/15 0700 11/15 0701 - 11/16 0700   I.V. (mL/kg) 716 (9.6) 109.6 (1.5)   NG/GT 1430 120   IV Piggyback 50 50   Total Intake(mL/kg) 2196 (29.5) 279.6 (3.8)   Urine (mL/kg/hr) 1440 (0.8)    Total Output 1440     Net +756 +279.6          PHYSICAL EXAMINATION: General: Elderly male, chronically ill appearing,  calm Neuro: Sedated on vent., rass 0 HEENT: [place]/AT. PERRL, sclerae anicteric.  B/l nares packed out Cardiovascular: IRIR, SEM unchanged Lungs: CTA, good air entry Abdomen: BS x 4, soft, NT/ND.  Musculoskeletal: No gross deformities, no edema.  Skin: Multiple small skin excoriations with scabs  LABS:  CBC  Recent Labs Lab 05/18/14 0400 05/19/14 0200 05/20/14 0400  WBC 7.2 6.7 6.9  HGB 8.0* 8.5* 8.1*  HCT 27.4* 28.2* 26.9*  PLT 180 179 204   Coag's  Recent Labs Lab 05/16/14 0338 05/17/14 0500 05/18/14 0400  INR 2.47* 1.99* 1.74*   BMET  Recent Labs Lab 05/18/14 0400 05/19/14 0200 05/20/14 0400  NA 139 140 143  K 3.7 3.6* 3.6*  CL 96 96 99  CO2 31 31 32  BUN 75* 71* 75*  CREATININE 1.96* 1.74* 1.66*  GLUCOSE 128* 114* 130*   Electrolytes  Recent Labs Lab 05/16/14 0338  05/18/14 0400 05/19/14 0200 05/20/14 0400  CALCIUM 8.6  < > 8.7 8.9 9.1  MG 2.2  --   --   --   --   PHOS 3.6  --   --   --   --   < > = values in this interval not displayed. Sepsis Markers  Recent Labs Lab 05/15/14 0515 05/16/14 0348 05/17/14 0500  PROCALCITON <0.10 0.34 0.34   ABG  Recent Labs Lab 05/15/14 1728 05/17/14 0350 05/18/14 1013  PHART 7.406 7.387 7.411  PCO2ART 52.1* 51.2* 48.9*  PO2ART 55.4* 66.7* 54.9*   Liver Enzymes  Recent Labs Lab 05/20/14 0400  AST 75*  ALT 41  ALKPHOS 167*  BILITOT 0.6  ALBUMIN 2.5*   Cardiac Enzymes  Recent Labs Lab 06/04/2014 2157  TROPONINI <0.30  PROBNP 13471.0*   Glucose  Recent Labs Lab 05/19/14 1126 05/19/14 1526 05/19/14 1958 05/19/14 2324 05/20/14 0346 05/20/14 0847  GLUCAP 145* 117* 116* 120* 143* 106*    Imaging Dg Chest Port 1 View  05/20/2014   CLINICAL DATA:  ARDS.  Smoker with COPD.  EXAM: PORTABLE CHEST - 1 VIEW  COMPARISON:  05/19/2014, 05/18/2014.  FINDINGS: Endotracheal tube remains in satisfactory position with the distal tip 5 cm above the carina. Nasogastric tube can be followed into  the proximal stomach and continues below the image. Right IJ central venous catheter terminates over the distal superior vena cava, stable.  Patient is status post median sternotomy for CABG and replacement of two cardiac valves. The heart is enlarged. There is atherosclerotic calcification of the thoracic aorta. There is diffuse pulmonary vascular congestion and moderate pulmonary edema. There are small bilateral pleural effusions. Aeration of the lungs appears slightly improved compared to yesterday's chest radiograph, which could be due to shifting of the pleural effusions, or decrease in the pulmonary edema. Upper extremity atherosclerotic calcifications are advanced.  IMPRESSION: Congestive heart failure pattern. Slight improvement of aeration of the lungs compared to yesterday's chest radiograph as described above.  Satisfactory position of support devices.   Electronically Signed   By: Curlene Dolphin M.D.   On: 05/20/2014 07:52   Dg Chest Port 1 View  05/19/2014   CLINICAL DATA:  ACUTE RESPIRATORY FAILURE.  EXAM: PORTABLE CHEST - 1 VIEW  COMPARISON:  ONE-VIEW CHEST 11/13/ 15  FINDINGS: The heart is enlarged. The patient remains intubated. A right IJ line is stable in position. The NG tube courses off the inferior border the film. Atherosclerotic changes are noted in the arch and bilateral axillary arteries. Moderate diffuse interstitial edema is slightly improved. Bilateral pleural effusions are noted. A basilar airspace disease is evident.  IMPRESSION: 1. Slight improvement in diffuse interstitial edema and bilateral pleural effusions. Congestive heart failure. 2. Stable cardiomegaly. 3. Support apparatus is stable.   Electronically Signed   By: Lawrence Santiago M.D.   On: 05/19/2014 07:09     ASSESSMENT / PLAN:  PULMONARY OETT 11/10 >>> A: Acute on chronic hypoxic respiratory failure ARDS COPD without evidence of exacerbation Likely blood aspiration rather than Pulmonary edema Small b/l  pleural effusions Tobacco use  P:   SBT will reduce PS 8 failed this am as propofol reduced Repeat wean with low dose propofol, 2 hr goal pcxr may lag behind clinical progress ARDS protocol keep plat less 30  VAP bundle. DuoNebs / Albuterol. Neg balance goals remain, increase lasix likely  CARDIOVASCULAR A:  A.fib rate controlled Hx mechanical MVR and AVR in 2003 Chronic anticoagulation due to above Acute diastolic CHF Hx  HTN, HLD P:  Lasix increase Coumadin / Plavix currently on hold -does he need plavix given recurrent nose bleeds?  Hep tolerated thus far as restarted by cards  RENAL A:  AKI ? Diuretic induced slow progress CKD - stable Hyponatremia-resolved P:   NS @ KVO. BMP in AM. Lasix increase  GASTROINTESTINAL A:   GERD Nutrition P:  Pantoprazole to oral Tf back on, remain  HEMATOLOGIC A:   Chronic anemia and dilutional On chronic anticoagulation VTE Prophylaxis P:  Transfuse for Hgb < 7. SCD's only. Daily INR - (warfarin and plavix have been held since admit). IV heparin per cards, follow urine bleeding again lasix  INFECTIOUS A:   Concern for aspiration PNA P:   Sputum Cx 11/10 >>>ng  Abx: Vanc, start date 11/10 >> 11/11 Abx: Zosyn, start date 11/10>>>11/14 Add ceftriaxone 11/14>>>plan total 7 days  ENDOCRINE A:   No known issues P:   Monitor glucose on BMP.  NEUROLOGIC A:   Acute metabolic encephalopathy ETOH abuse P:   Sedation:  Propofol gtt / Fentanyl PRN. RASS goal: 0 to -1. Daily WUA. Thiamine / Folate / Multivitamin. May need to wean on low dose propofol  Rash  -resolved  Family updated: s-in law HCPOA - updated 11/12  Interdisciplinary Family Meeting v Palliative Care Meeting:  Due by: 11/17.  Summary - hep restarted by cards, wean with prop, lasix increase  The patient is critically ill with multiple organ systems failure and requires high complexity decision making for assessment and support, frequent  evaluation and titration of therapies, application of advanced monitoring technologies and extensive interpretation of multiple databases. Critical Care Time devoted to patient care services described in this note is 30 minutes.   Lavon Paganini. Titus Mould, MD, [place] Pgr: [facility]

## 2014-05-20 NOTE — Progress Notes (Signed)
ANTICOAGULATION CONSULT NOTE - Follow up  Pharmacy Consult for Heparin Indication: mechanical heart valves and A.fib  No Known Allergies  Patient Measurements: Height: 5\' 6"  (167.6 cm) Weight: 164 lb 3.9 oz (74.5 kg) IBW/kg (Calculated) : 63.8  Vital Signs: Temp: 98.8 F (37.1 C) (11/15 1700) BP: 122/66 mmHg (11/15 1700) Pulse Rate: 73 (11/15 1700)  Labs:  Recent Labs  05/18/14 0400 05/18/14 1300 05/19/14 0200 05/19/14 0255 05/20/14 0400 05/20/14 1721  HGB 8.0*  --  8.5*  --  8.1*  --   HCT 27.4*  --  28.2*  --  26.9*  --   PLT 180  --  179  --  204  --   LABPROT 20.5*  --   --   --   --   --   INR 1.74*  --   --   --   --   --   HEPARINUNFRC 0.30 0.45  --  0.51  --  0.33  CREATININE 1.96*  --  1.74*  --  1.66*  --     Estimated Creatinine Clearance: 38.4 mL/min (by C-G formula based on Cr of 1.66).  Assessment: 68 y.o. M with PMH of above, including prosthetic MV and Ao valve, atrial fibrillation admitted with epistaxis (hx of frequent nose bleeds). Patient on chronic anticoagulation with warfarin prior to admission. (INR 2.28 upon admissio)   CBC: Hgb 8.5 (trending down), Pltc WNL today (trending down) Baseline PT: 22.8 Baseline INR: 1.99  Significant events: 11/12: HL 0.23 on 1000units/hr, increased to 1200units/hr. 11/13: INR 1.74. HL 0.3 on 1200units/hr, increased to 1300units/hr. CBC stable. HL 0.45 on 1300 units/hr. 11/14: HL 0.51 on 1300units/hr, decreased to 1200units/hr. Bleeding from penis so heparin stopped. 11/15: No further bleeding. Heparin ordered to resume by cardiology. CBC stable. HL 0.33 therapeutic after restarted this AM  Goal of Therapy:  Heparin level 0.3-0.7 units/ml (Aiming for lower end(0.3-0.5 units/ml) d/t bleeding risk) Monitor platelets by anticoagulation protocol: Yes   Plan:   Continue IV heparin 1200 units/hr  Recheck heparin level tomorrow AM  Follow CBC, renal function, daily heparin levels.   Adrian Saran, PharmD,  BCPS Pager [phone] 05/20/2014 6:11 PM

## 2014-05-21 ENCOUNTER — Inpatient Hospital Stay (HOSPITAL_COMMUNITY): Payer: Medicare Other

## 2014-05-21 DIAGNOSIS — Z9289 Personal history of other medical treatment: Secondary | ICD-10-CM

## 2014-05-21 DIAGNOSIS — J80 Acute respiratory distress syndrome: Secondary | ICD-10-CM | POA: Insufficient documentation

## 2014-05-21 LAB — BASIC METABOLIC PANEL
Anion gap: 12 (ref 5–15)
BUN: 81 mg/dL — ABNORMAL HIGH (ref 6–23)
CO2: 30 mEq/L (ref 19–32)
Calcium: 9.3 mg/dL (ref 8.4–10.5)
Chloride: 99 mEq/L (ref 96–112)
Creatinine, Ser: 2.04 mg/dL — ABNORMAL HIGH (ref 0.50–1.35)
GFR calc Af Amer: 37 mL/min — ABNORMAL LOW (ref >90)
GFR calc non Af Amer: 32 mL/min — ABNORMAL LOW (ref >90)
Glucose, Bld: 121 mg/dL — ABNORMAL HIGH (ref 70–99)
POTASSIUM: 4 meq/L (ref 3.7–5.3)
Sodium: 141 mEq/L (ref 137–147)

## 2014-05-21 LAB — CBC WITH DIFFERENTIAL/PLATELET
Basophils Absolute: 0 10*3/uL (ref 0.0–0.1)
Basophils Relative: 0 % (ref 0–1)
Eosinophils Absolute: 0.4 10*3/uL (ref 0.0–0.7)
Eosinophils Relative: 4 % (ref 0–5)
HCT: 24.4 % — ABNORMAL LOW (ref 39.0–52.0)
Hemoglobin: 7.3 g/dL — ABNORMAL LOW (ref 13.0–17.0)
LYMPHS ABS: 0.4 10*3/uL — AB (ref 0.7–4.0)
Lymphocytes Relative: 4 % — ABNORMAL LOW (ref 12–46)
MCH: 28.5 pg (ref 26.0–34.0)
MCHC: 29.9 g/dL — ABNORMAL LOW (ref 30.0–36.0)
MCV: 95.3 fL (ref 78.0–100.0)
Monocytes Absolute: 1.1 10*3/uL — ABNORMAL HIGH (ref 0.1–1.0)
Monocytes Relative: 11 % (ref 3–12)
NEUTROS PCT: 81 % — AB (ref 43–77)
Neutro Abs: 7.4 10*3/uL (ref 1.7–7.7)
Platelets: 204 10*3/uL (ref 150–400)
RBC: 2.56 MIL/uL — AB (ref 4.22–5.81)
RDW: 19.4 % — ABNORMAL HIGH (ref 11.5–15.5)
WBC: 9.3 10*3/uL (ref 4.0–10.5)

## 2014-05-21 LAB — GLUCOSE, CAPILLARY
GLUCOSE-CAPILLARY: 130 mg/dL — AB (ref 70–99)
GLUCOSE-CAPILLARY: 139 mg/dL — AB (ref 70–99)
GLUCOSE-CAPILLARY: 158 mg/dL — AB (ref 70–99)
Glucose-Capillary: 120 mg/dL — ABNORMAL HIGH (ref 70–99)
Glucose-Capillary: 139 mg/dL — ABNORMAL HIGH (ref 70–99)
Glucose-Capillary: 104 mg/dL — ABNORMAL HIGH (ref 70–99)
Glucose-Capillary: 138 mg/dL — ABNORMAL HIGH (ref 70–99)

## 2014-05-21 LAB — HEPARIN LEVEL (UNFRACTIONATED): Heparin Unfractionated: 0.34 IU/mL (ref 0.30–0.70)

## 2014-05-21 LAB — TRIGLYCERIDES: TRIGLYCERIDES: 141 mg/dL (ref <150)

## 2014-05-21 LAB — OCCULT BLOOD X 1 CARD TO LAB, STOOL: Fecal Occult Bld: POSITIVE — AB

## 2014-05-21 LAB — MAGNESIUM: MAGNESIUM: 2.2 mg/dL (ref 1.5–2.5)

## 2014-05-21 LAB — PHOSPHORUS: PHOSPHORUS: 4.9 mg/dL — AB (ref 2.3–4.6)

## 2014-05-21 MED ORDER — PANTOPRAZOLE SODIUM 40 MG IV SOLR
40.0000 mg | Freq: Every day | INTRAVENOUS | Status: DC
  Administered 2014-05-21 – 2014-05-22 (×2): 40 mg via INTRAVENOUS
  Filled 2014-05-21 (×2): qty 40

## 2014-05-21 NOTE — Progress Notes (Signed)
ANTICOAGULATION CONSULT NOTE - Follow Up Consult  Pharmacy Consult for Heparin Indication: afib, mechanical MVR and AVR  No Known Allergies  Patient Measurements: Height: 5\' 6"  (167.6 cm) Weight: 161 lb 9.6 oz (73.3 kg) IBW/kg (Calculated) : 63.8 Heparin Dosing Weight: actual weight  Vital Signs: Temp: 99.1 F (37.3 C) (11/16 0600) BP: 138/75 mmHg (11/16 0600) Pulse Rate: 109 (11/16 0600)  Labs:  Recent Labs  05/19/14 0200 05/19/14 0255 05/20/14 0400 05/20/14 1721 05/21/14 0455  HGB 8.5*  --  8.1*  --  7.3*  HCT 28.2*  --  26.9*  --  24.4*  PLT 179  --  204  --  204  HEPARINUNFRC  --  0.51  --  0.33 0.34  CREATININE 1.74*  --  1.66*  --  2.04*    Estimated Creatinine Clearance: 31.3 mL/min (by C-G formula based on Cr of 2.04).   Medications:  Infusions:  . heparin 1,200 Units/hr (05/21/14 0600)  . propofol 45.115 mcg/kg/min (05/21/14 0600)    Assessment: 71 yoM admitted on 11/18 with epistaxis, and required intubation on 11/10 for dyspnea.  He has chronic recurrent epistaxis with PMH of mechanical heart valves (MVR, AVR) on Plavix and warfarin 2.5mg  daily except 2.5mg  on Mon/Wed/Fri.  INR was 2.28 on admission.  Pharmacy was consulted to dose IV heparin on 11/12 for mechanical heart valves and afib.  Significant events 11/12 Heparin started 11/14 Heparin stopped d/t penile bleeding at noon. 11/15 Heparin resumed, no further bleeding noted.   Today, 05/21/2014  Heparin level 0.34, remains therapeutic  CBC: Hgb decreased to 7.3, Plt remain stable to 204  No bleeding reported by RN or documented in progress notes  SCr significantly increased to 2.04, CrCl ~ 31 ml/min    Goal of Therapy:  Heparin level 0.3-0.7 units/ml Monitor platelets by anticoagulation protocol: Yes   Plan:   Continue heparin IV infusion at 1200 units/hr  Daily heparin level and CBC  Continue to monitor H&H and platelets   Gretta Arab PharmD, BCPS Pager  [phone] 05/21/2014 7:10 AM

## 2014-05-21 NOTE — Progress Notes (Signed)
     SUBJECTIVE: Pt is intubated.   BP 146/59 mmHg  Pulse 89  Temp(Src) 99 F (37.2 C) (Core (Comment))  Resp 31  Ht 5\' 6"  (1.676 m)  Wt 161 lb 9.6 oz (73.3 kg)  BMI 26.09 kg/m2  SpO2 98%  Intake/Output Summary (Last 24 hours) at 05/21/14 0606 Last data filed at 05/21/14 0400  Gross per 24 hour  Intake 1848.8 ml  Output   1030 ml  Net  818.8 ml    PHYSICAL EXAM General: Intubated.  Neck: No JVD. No masses noted.  Lungs: Mechanical breath sounds bilaterally with no wheezes or rhonci noted.  Heart: irreg irreg, tachy with no murmurs noted. Abdomen: Bowel sounds are present. Soft, non-tender.  Extremities: No lower extremity edema.   LABS: Basic Metabolic Panel:  Recent Labs  05/20/14 0400 05/21/14 0455  NA 143 141  K 3.6* 4.0  CL 99 99  CO2 32 30  GLUCOSE 130* 121*  BUN 75* 81*  CREATININE 1.66* 2.04*  CALCIUM 9.1 9.3  MG  --  2.2  PHOS  --  4.9*   CBC:  Recent Labs  05/20/14 0400 05/21/14 0455  WBC 6.9 9.3  NEUTROABS 5.6 7.4  HGB 8.1* 7.3*  HCT 26.9* 24.4*  MCV 93.1 95.3  PLT 204 204   Current Meds: . antiseptic oral rinse  7 mL Mouth Rinse QID  . atorvastatin  80 mg Per Tube Daily  . cefTRIAXone (ROCEPHIN)  IV  1 g Intravenous Q24H  . chlorhexidine  15 mL Mouth Rinse BID  . feeding supplement (VITAL HIGH PROTEIN)  1,000 mL Per Tube Q24H  . folic acid  1 mg Per Tube Daily  . furosemide  80 mg Intravenous BID  . ipratropium-albuterol  3 mL Nebulization Q6H  . lidocaine   Topical Once  . metoprolol tartrate  25 mg Per Tube BID  . multivitamin  5 mL Per Tube Daily  . oxymetazoline  1 spray Each Nare Once  . pantoprazole (PROTONIX) IV  40 mg Intravenous Daily  . sodium chloride  3 mL Intravenous Q12H  . thiamine  100 mg Per Tube Daily    ASSESSMENT AND PLAN:  1. Mechanical AVR and MVR: Continue IV heparin    2. Atrial fibrillation: Rate controlled, 95-105 bpm. Continue metoprolol. Titrate as needed for rate control. BP is stable.   3.  Acute diastolic heart failure: On IV Lasix. Renal function is worsening. His weight is still up 13 lbs over admit weight. Additional diuresis may benefit attempts at extubation. Echo with normal LVEF=55%.    MCALHANY,CHRISTOPHER  11/16/20156:06 AM

## 2014-05-21 NOTE — Progress Notes (Signed)
NUTRITION FOLLOW UP  Intervention:   -Due to increase in propofol, modify Vital High Protein to new goal rate of 50 ml/hr.  Tube feeding regimen + propofol (509 kcal) provides 1700 kcal (100% of needs), 105 grams of protein, and 1003 ml of H2O.  -RD to continue to monitor  Nutrition Dx:   Inadequate oral intake related to decreased appetite as evidenced by pt report and signs of muscle wasting.; ongoing, now r/t inability to eat and evidenced by NPO status  New Goal:   TF to meet >/= 90% of their estimated nutrition needs    Monitor:   TF tolerance, diet order, total protein/energy intake, labs, weights  Assessment:   11/09: Pt distracted during visit d/t epistaxis. Pt just finished lunch tray prior to visit. Pt ate most of tray. Pt states appetite is improved.  Pt reports no weight loss. UBW of 145-150lb. Pt with CHF and CKD, weight fluctuations may be d/t fluid. Pt states he tries to watch his salt intake at home. PTA he was not interested in eating.   Dietary Recall: B: normally skips L: maybe a sandwich D: another sandwich  RD suspects poor diet quality given history of ETOH abuse.  11/10: -Pt with hypoxia and was transferred to ICU on 11/10 -Discussed pt with RN for possible nutritional needs. Received consult to initiate EN after discussion. -Patient is currently intubated on ventilator support MV: 11.2 L/min Temp (24hrs), Avg:99 F (37.2 C), Min:98.4 F (36.9 C), Max:99.5 F (37.5 C)  Propofol: 16 ml/hr- providing 422 lipid based kcal/daily  11/11: -Due to decrease in propofol, recommend to increase Vital High Protein to new goal rate of 60 ml/hr -Tube feeding regimen + propofol (114 kcal) provides 1554 kcal (96% of needs), 125 grams of protein, and 1203 ml of H2O  11/16: -Pt's remains intubated and sedated. Propofol increased to 19.3 ml/hr, providing approximately 509 kcal/daily -Per discussion with RN, plan to attempt wake up assessment and trial half dosage  of propofol; however, will likely be unable to extubate. Will modify VHP goal rate to prevent overfeeding -Tolerating tube feeding, residuals of 80 ml or less documented. -Pt weight increased 8 lbs since admit; likely r/t to + 1 L fluid and edemas.   Height: Ht Readings from Last 1 Encounters:  05/15/14 5\' 6"  (1.676 m)    Weight Status:   Wt Readings from Last 1 Encounters:  05/21/14 161 lb 9.6 oz (73.3 kg)  05/20/14 153lb  Re-estimated needs:  Kcal: 1616 Protein: 85-95 gram Fluid: 1.8 L/day  Skin: ecchymosis, +1 generalized edema, + 2 perineal edema, +1 LLE edema  Diet Order: Diet NPO time specified   Intake/Output Summary (Last 24 hours) at 05/21/14 1301 Last data filed at 05/21/14 1100  Gross per 24 hour  Intake   2171 ml  Output   1165 ml  Net   1006 ml    Last BM: 11/15   Labs:   Recent Labs Lab 05/16/14 0338  05/19/14 0200 05/20/14 0400 05/21/14 0455  NA 133*  < > 140 143 141  K 4.1  < > 3.6* 3.6* 4.0  CL 93*  < > 96 99 99  CO2 32  < > 31 32 30  BUN 60*  < > 71* 75* 81*  CREATININE 2.00*  < > 1.74* 1.66* 2.04*  CALCIUM 8.6  < > 8.9 9.1 9.3  MG 2.2  --   --   --  2.2  PHOS 3.6  --   --   --  4.9*  GLUCOSE 118*  < > 114* 130* 121*  < > = values in this interval not displayed.  CBG (last 3)   Recent Labs  05/21/14 0457 05/21/14 0748 05/21/14 1208  GLUCAP 120* 139* 139*    Scheduled Meds: . antiseptic oral rinse  7 mL Mouth Rinse QID  . atorvastatin  80 mg Per Tube Daily  . cefTRIAXone (ROCEPHIN)  IV  1 g Intravenous Q24H  . chlorhexidine  15 mL Mouth Rinse BID  . feeding supplement (VITAL HIGH PROTEIN)  1,000 mL Per Tube Q24H  . folic acid  1 mg Per Tube Daily  . furosemide  80 mg Intravenous BID  . ipratropium-albuterol  3 mL Nebulization Q6H  . lidocaine   Topical Once  . metoprolol tartrate  25 mg Per Tube BID  . multivitamin  5 mL Per Tube Daily  . oxymetazoline  1 spray Each Nare Once  . pantoprazole (PROTONIX) IV  40 mg  Intravenous Daily  . sodium chloride  3 mL Intravenous Q12H  . thiamine  100 mg Per Tube Daily    Continuous Infusions: . heparin 1,200 Units/hr (05/21/14 0600)  . propofol 40 mcg/kg/min (05/21/14 1100)    Atlee Abide MS RD LDN Clinical Dietitian [phone]

## 2014-05-21 NOTE — Progress Notes (Signed)
PULMONARY / CRITICAL CARE MEDICINE   Name: [name] MRN: [id number] DOB: 12-25-1945    ADMISSION DATE:  05/25/2014 CONSULTATION DATE:  05/21/2014  REFERRING MD :  Rama  CHIEF COMPLAINT:  Respiratory failure in setting epistaxis  INITIAL PRESENTATION:  68 y.o. M brought to [facility] 11/8 with epistaxis.  Had rhinopack placed with temporary cessation of bleeding.  Seen by ENT 11/9 and had surgicel packing placed with apparent cessation of bleeding.  In early AM hours 11/10, developed worsening dyspnea despite NRB mask.  He was transferred to the ICU and intubated by CRNA for resp acidosis.     PMH - chronic 2L O2, frequent nosebleeds due to being on warfarin and plavix for hx of 2 mechanical heart valves.    STUDIES:    SIGNIFICANT EVENTS: 11/08  Admitted with epistaxis, required nasal packing 11/09  ENT consult 11/10  Transferred to ICU, intubated 11/14  Weaning, hematuria, hep held 11/16  No further hematuria or epistaxis, FiO2/PEEP increased to 50/8  SUBJECTIVE:  Increased FiO2 / PEEP needs  VITAL SIGNS: Temp:  [98.1 F (36.7 C)-99.5 F (37.5 C)] 99.3 F (37.4 C) (11/16 0800) Pulse Rate:  [45-128] 117 (11/16 0800) Resp:  [25-37] 27 (11/16 0800) BP: (110-157)/(38-80) 143/67 mmHg (11/16 0800) SpO2:  [88 %-100 %] 99 % (11/16 0800) FiO2 (%):  [40 %-60 %] 50 % (11/16 0800) Weight:  [161 lb 9.6 oz (73.3 kg)] 161 lb 9.6 oz (73.3 kg) (11/16 0446)   HEMODYNAMICS: CVP:  [12 mmHg-13 mmHg] 13 mmHg   VENTILATOR SETTINGS: Vent Mode:  [-] PRVC FiO2 (%):  [40 %-60 %] 50 % Set Rate:  [18 bmp-24 bmp] 18 bmp Vt Set:  [380 mL] 380 mL PEEP:  [5 cmH20-8 cmH20] 8 cmH20 Pressure Support:  [5 cmH20] 5 cmH20 Plateau Pressure:  [12 cmH20-19 cmH20] 14 cmH20   INTAKE / OUTPUT: Intake/Output      11/15 0701 - 11/16 0700 11/16 0701 - 11/17 0700   I.V. (mL/kg) 925.6 (12.6) 41.3 (0.6)   NG/GT 1330 60   IV Piggyback 50    Total Intake(mL/kg) 2305.6 (31.5) 101.3 (1.4)   Urine (mL/kg/hr)  1105 (0.6) 90 (0.7)   Total Output 1105 90   Net +1200.6 +11.3        Stool Occurrence 4 x     PHYSICAL EXAMINATION: General: Elderly male, chronically ill appearing, calm Neuro: Sedated on vent., rass -1 HEENT: [place]/AT. PERRL, sclerae anicteric.   Cardiovascular: IRIR, SEM unchanged, mechanical valvular click Lungs: CTA, good air entry Abdomen: BS x 4, soft, NT/ND.  Musculoskeletal: No gross deformities, no edema.  Skin: Multiple small skin excoriations with scabs, thin fragile skin  LABS:  CBC  Recent Labs Lab 05/19/14 0200 05/20/14 0400 05/21/14 0455  WBC 6.7 6.9 9.3  HGB 8.5* 8.1* 7.3*  HCT 28.2* 26.9* 24.4*  PLT 179 204 204   Coag's  Recent Labs Lab 05/16/14 0338 05/17/14 0500 05/18/14 0400  INR 2.47* 1.99* 1.74*   BMET  Recent Labs Lab 05/19/14 0200 05/20/14 0400 05/21/14 0455  NA 140 143 141  K 3.6* 3.6* 4.0  CL 96 99 99  CO2 31 32 30  BUN 71* 75* 81*  CREATININE 1.74* 1.66* 2.04*  GLUCOSE 114* 130* 121*   Electrolytes  Recent Labs Lab 05/16/14 0338  05/19/14 0200 05/20/14 0400 05/21/14 0455  CALCIUM 8.6  < > 8.9 9.1 9.3  MG 2.2  --   --   --  2.2  PHOS 3.6  --   --   --  4.9*  < > = values in this interval not displayed.   Sepsis Markers  Recent Labs Lab 05/15/14 0515 05/16/14 0348 05/17/14 0500  PROCALCITON <0.10 0.34 0.34   ABG  Recent Labs Lab 05/17/14 0350 05/18/14 1013 05/20/14 2132  PHART 7.387 7.411 7.413  PCO2ART 51.2* 48.9* 43.4  PO2ART 66.7* 54.9* 52.4*   Liver Enzymes  Recent Labs Lab 05/20/14 0400  AST 75*  ALT 41  ALKPHOS 167*  BILITOT 0.6  ALBUMIN 2.5*   Glucose  Recent Labs Lab 05/20/14 0847 05/20/14 1133 05/20/14 1618 05/20/14 1943 05/21/14 0013 05/21/14 0457  GLUCAP 106* 156* 128* 129* 158* 120*   Imaging Dg Chest Port 1 View  05/21/2014   CLINICAL DATA:  68 year old male with acute respiratory acidosis. Atrial fibrillation. Congestive heart failure. Coronary artery disease. COPD.  Subsequent encounter.  EXAM: PORTABLE CHEST - 1 VIEW  COMPARISON:  05/20/2014.  FINDINGS: Endotracheal tube tip 4.4 cm above the carina.  Right central line tip mid superior vena cava level.  Nasogastric tube courses below the diaphragm. Tip is not included on the present exam.  Post mitral and aortic valve replacement.  Cardiomegaly.  Pulmonary vascular congestion/ pulmonary edema with slightly improved aeration left lung base which may represent shifting pleural fluid.  Basal atelectasis suspected. Difficult to exclude basilar infiltrate given this appearance.  Calcified tortuous aorta.  IMPRESSION: Slight improved aeration left lung base may represent shifting pleural fluid. Remainder of findings without significant change as noted above.   Electronically Signed   By: Chauncey Cruel M.D.   On: 05/21/2014 07:40   Dg Chest Port 1 View  05/20/2014   CLINICAL DATA:  ARDS.  Smoker with COPD.  EXAM: PORTABLE CHEST - 1 VIEW  COMPARISON:  05/19/2014, 05/18/2014.  FINDINGS: Endotracheal tube remains in satisfactory position with the distal tip 5 cm above the carina. Nasogastric tube can be followed into the proximal stomach and continues below the image. Right IJ central venous catheter terminates over the distal superior vena cava, stable.  Patient is status post median sternotomy for CABG and replacement of two cardiac valves. The heart is enlarged. There is atherosclerotic calcification of the thoracic aorta. There is diffuse pulmonary vascular congestion and moderate pulmonary edema. There are small bilateral pleural effusions. Aeration of the lungs appears slightly improved compared to yesterday's chest radiograph, which could be due to shifting of the pleural effusions, or decrease in the pulmonary edema. Upper extremity atherosclerotic calcifications are advanced.  IMPRESSION: Congestive heart failure pattern. Slight improvement of aeration of the lungs compared to yesterday's chest radiograph as described above.   Satisfactory position of support devices.   Electronically Signed   By: Curlene Dolphin M.D.   On: 05/20/2014 07:52   ASSESSMENT / PLAN:  PULMONARY OETT 11/10 >>> A: Acute on chronic hypoxic respiratory failure ARDS - PaO2/FiO2 ratio consistent with moderate ARDS COPD without evidence of exacerbation Likely blood aspiration rather than Pulmonary edema Small b/l pleural effusions Tobacco use  P:   Daily SBT / WUA ARDS protocol keep plat less 30  Wean PEEP / FiO2 per protocol  Trend CXR VAP bundle. DuoNebs + Q3 PRN Albuterol. Neg balance as able, daily assessment of lasix needs  CARDIOVASCULAR A:  A.fib - rate controlled Hx mechanical MVR and AVR in 2003 Chronic anticoagulation due to above Acute diastolic CHF Hx  HTN, HLD P:  Lasix as above, hold after 11/16 pm dose and reassess in am  Coumadin / Plavix currently on hold - does  he need plavix given recurrent nose bleeds?  Heparin tolerated thus far,  restarted by cards Tele Monitoring   RENAL A:   AKI  CKD - stable Hyponatremia - resolved P:   NS @ KVO. Trend BMP. Lasix as above Replace electrolytes as indicated.  GASTROINTESTINAL A:   GERD Nutrition P:   Pantoprazole for SUP TF per nutrition   HEMATOLOGIC A:   Anemia - chronic + dilutional component  On chronic anticoagulation VTE Prophylaxis P:  Transfuse for Hgb < 7. SCD's only. IV heparin per cards, follow closely for reoccurrence of bleeding   INFECTIOUS A:   Concern for aspiration PNA P:   Sputum Cx 11/10 >>>ng  Abx: Vanc, start date 11/10 >> 11/11 Abx: Zosyn, start date 11/10>>>11/14 Ceftriaxone 11/14 >>>plan total 7 days, stop date in place  ENDOCRINE A:   No known issues P:   Monitor glucose on BMP.  NEUROLOGIC A:   Acute metabolic encephalopathy ETOH abuse P:   Sedation:  Propofol gtt / Fentanyl PRN. RASS goal: 0 to -1. Daily WUA. Thiamine / Folate / Multivitamin. May need to wean on low dose  propofol  INTEGUMENTARY A: Rash - resolved  Family updated: s-in law HCPOA - updated 11/12.  No family available 11/16.   Interdisciplinary Family Meeting v Palliative Care Meeting:  Due by: 11/17.  Noe Gens, NP-C [facility] Pgr: [phone] or [phone]  Hold diureses for today, decrease PEEP to 5 and FiO2 of 40%, check stool OB given high BUN:Cr ratio (r/o GI bleeding), continue sedation for now, SBT in AM.  My CC time 35 min.  Rush Farmer, M.D. [facility]. Pager: [phone]. After hours pager: [phone].

## 2014-05-22 ENCOUNTER — Inpatient Hospital Stay (HOSPITAL_COMMUNITY): Payer: Medicare Other

## 2014-05-22 DIAGNOSIS — J80 Acute respiratory distress syndrome: Secondary | ICD-10-CM

## 2014-05-22 LAB — BLOOD GAS, ARTERIAL
ACID-BASE EXCESS: 2.7 mmol/L — AB (ref 0.0–2.0)
Bicarbonate: 28.2 mEq/L — ABNORMAL HIGH (ref 20.0–24.0)
Drawn by: 31814
FIO2: 0.4 %
O2 Saturation: 91.5 %
PCO2 ART: 53.5 mmHg — AB (ref 35.0–45.0)
PEEP: 5 cmH2O
PO2 ART: 70.2 mmHg — AB (ref 80.0–100.0)
Patient temperature: 99
RATE: 18 resp/min
TCO2: 27.2 mmol/L (ref 0–100)
VT: 380 mL
pH, Arterial: 7.344 — ABNORMAL LOW (ref 7.350–7.450)

## 2014-05-22 LAB — BASIC METABOLIC PANEL
ANION GAP: 15 (ref 5–15)
BUN: 93 mg/dL — ABNORMAL HIGH (ref 6–23)
CO2: 30 meq/L (ref 19–32)
Calcium: 9.3 mg/dL (ref 8.4–10.5)
Chloride: 100 mEq/L (ref 96–112)
Creatinine, Ser: 2.57 mg/dL — ABNORMAL HIGH (ref 0.50–1.35)
GFR calc Af Amer: 28 mL/min — ABNORMAL LOW (ref >90)
GFR calc non Af Amer: 24 mL/min — ABNORMAL LOW (ref >90)
GLUCOSE: 115 mg/dL — AB (ref 70–99)
POTASSIUM: 4.1 meq/L (ref 3.7–5.3)
Sodium: 145 mEq/L (ref 137–147)

## 2014-05-22 LAB — GLUCOSE, CAPILLARY
GLUCOSE-CAPILLARY: 141 mg/dL — AB (ref 70–99)
GLUCOSE-CAPILLARY: 119 mg/dL — AB (ref 70–99)
Glucose-Capillary: 124 mg/dL — ABNORMAL HIGH (ref 70–99)
Glucose-Capillary: 131 mg/dL — ABNORMAL HIGH (ref 70–99)
Glucose-Capillary: 117 mg/dL — ABNORMAL HIGH (ref 70–99)
Glucose-Capillary: 126 mg/dL — ABNORMAL HIGH (ref 70–99)

## 2014-05-22 LAB — IRON AND TIBC
Iron: 38 ug/dL — ABNORMAL LOW (ref 42–135)
SATURATION RATIOS: 12 % — AB (ref 20–55)
TIBC: 320 ug/dL (ref 215–435)
UIBC: 282 ug/dL (ref 125–400)

## 2014-05-22 LAB — CBC
HEMATOCRIT: 26.2 % — AB (ref 39.0–52.0)
Hemoglobin: 7.7 g/dL — ABNORMAL LOW (ref 13.0–17.0)
MCH: 27.8 pg (ref 26.0–34.0)
MCHC: 29.4 g/dL — ABNORMAL LOW (ref 30.0–36.0)
MCV: 94.6 fL (ref 78.0–100.0)
PLATELETS: 216 10*3/uL (ref 150–400)
RBC: 2.77 MIL/uL — ABNORMAL LOW (ref 4.22–5.81)
RDW: 19.5 % — ABNORMAL HIGH (ref 11.5–15.5)
WBC: 8.1 10*3/uL (ref 4.0–10.5)

## 2014-05-22 LAB — HEPARIN LEVEL (UNFRACTIONATED): Heparin Unfractionated: 0.41 IU/mL (ref 0.30–0.70)

## 2014-05-22 LAB — PHOSPHORUS: Phosphorus: 6 mg/dL — ABNORMAL HIGH (ref 2.3–4.6)

## 2014-05-22 LAB — PROCALCITONIN: Procalcitonin: 0.41 ng/mL

## 2014-05-22 LAB — OCCULT BLOOD X 1 CARD TO LAB, STOOL: Fecal Occult Bld: POSITIVE — AB

## 2014-05-22 LAB — MAGNESIUM: Magnesium: 2.1 mg/dL (ref 1.5–2.5)

## 2014-05-22 LAB — FERRITIN: Ferritin: 224 ng/mL (ref 22–322)

## 2014-05-22 LAB — TRIGLYCERIDES: TRIGLYCERIDES: 149 mg/dL (ref <150)

## 2014-05-22 MED ORDER — SODIUM CHLORIDE 0.9 % IV BOLUS (SEPSIS)
500.0000 mL | Freq: Once | INTRAVENOUS | Status: AC
  Administered 2014-05-22: 500 mL via INTRAVENOUS

## 2014-05-22 MED ORDER — PANTOPRAZOLE SODIUM 40 MG PO PACK
40.0000 mg | PACK | Freq: Every day | ORAL | Status: DC
  Administered 2014-05-23 – 2014-05-24 (×2): 40 mg
  Filled 2014-05-22 (×2): qty 20

## 2014-05-22 NOTE — Progress Notes (Signed)
     SUBJECTIVE: Intubated.   BP 95/50 mmHg  Pulse 101  Temp(Src) 99.1 F (37.3 C) (Core (Comment))  Resp 23  Ht 5\' 6"  (1.676 m)  Wt 163 lb 12.8 oz (74.3 kg)  BMI 26.45 kg/m2  SpO2 91%  Intake/Output Summary (Last 24 hours) at 05/22/14 0602 Last data filed at 05/22/14 0400  Gross per 24 hour  Intake 2048.3 ml  Output   1235 ml  Net  813.3 ml    PHYSICAL EXAM General: Intubated.   Neck: + JVD.   Lungs: Mechanical BS bilaterally with no wheezes or rhonci noted.  Heart: irreg irreg with no murmurs noted. Abdomen: Bowel sounds are present. Soft, non-tender.  Extremities: No lower extremity edema.   LABS: Basic Metabolic Panel:  Recent Labs  05/20/14 0400 05/21/14 0455  NA 143 141  K 3.6* 4.0  CL 99 99  CO2 32 30  GLUCOSE 130* 121*  BUN 75* 81*  CREATININE 1.66* 2.04*  CALCIUM 9.1 9.3  MG  --  2.2  PHOS  --  4.9*   CBC:  Recent Labs  05/20/14 0400 05/21/14 0455  WBC 6.9 9.3  NEUTROABS 5.6 7.4  HGB 8.1* 7.3*  HCT 26.9* 24.4*  MCV 93.1 95.3  PLT 204 204   Fasting Lipid Panel:  Recent Labs  05/21/14 0455  TRIG 141    Current Meds: . antiseptic oral rinse  7 mL Mouth Rinse QID  . atorvastatin  80 mg Per Tube Daily  . cefTRIAXone (ROCEPHIN)  IV  1 g Intravenous Q24H  . chlorhexidine  15 mL Mouth Rinse BID  . feeding supplement (VITAL HIGH PROTEIN)  1,000 mL Per Tube Q24H  . folic acid  1 mg Per Tube Daily  . ipratropium-albuterol  3 mL Nebulization Q6H  . lidocaine   Topical Once  . metoprolol tartrate  25 mg Per Tube BID  . multivitamin  5 mL Per Tube Daily  . oxymetazoline  1 spray Each Nare Once  . pantoprazole (PROTONIX) IV  40 mg Intravenous Daily  . sodium chloride  3 mL Intravenous Q12H  . thiamine  100 mg Per Tube Daily     ASSESSMENT AND PLAN:  1. Mechanical AVR and MVR: Continue IV heparin   2. Atrial fibrillation: Rate controlled, 100-110 bpm. Rate will be hard to control is setting of acute illness/respiratory failure.  Continue metoprolol. Titrate as needed for rate control as BP tolerates.    3. Acute diastolic heart failure: Lasix on hold with worsening renal function yesterday. BMET pending this am. Weight is still up 15 lbs over admit weight. Additional diuresis may benefit attempts at extubation if renal function tolerates. Echo with normal LVEF=55%.   4. Respiratory failure/ARDS: Per PCCM   5. Epistaxis: No further bleeding. Plavix would not need to be restarted. Last stent in July 2014.   MCALHANY,CHRISTOPHER  11/17/20156:02 AM

## 2014-05-22 NOTE — Plan of Care (Signed)
Problem: Phase I Progression Outcomes Goal: Sedation Protocol initiated if indicated Outcome: Completed/Met Date Met:  05/22/14 Goal: Pneumonia/flu vaccination screen completed Outcome: Completed/Met Date Met:  05/22/14 Goal: Code status addressed with pt/family Outcome: Completed/Met Date Met:  05/22/14 Goal: Pain controlled with appropriate interventions Outcome: Completed/Met Date Met:  05/22/14 Goal: Hemodynamically stable Outcome: Completed/Met Date Met:  05/22/14 Goal: Baseline oxygen/pH stable Outcome: Completed/Met Date Met:  05/22/14 Goal: Patient tolerating nututrition at goal Outcome: Completed/Met Date Met:  05/22/14

## 2014-05-22 NOTE — Progress Notes (Signed)
ANTICOAGULATION CONSULT NOTE - Follow Up Consult  Pharmacy Consult for Heparin Indication: afib, mechanical MVR and AVR  No Known Allergies  Patient Measurements: Height: 5\' 6"  (167.6 cm) Weight: 163 lb 12.8 oz (74.3 kg) IBW/kg (Calculated) : 63.8 Heparin Dosing Weight: actual weight  Vital Signs: Temp: 99.7 F (37.6 C) (11/17 0600) Temp Source: Core (Comment) (11/17 0400) BP: 104/54 mmHg (11/17 0600) Pulse Rate: 107 (11/17 0600)  Labs:  Recent Labs  05/20/14 0400 05/20/14 1721 05/21/14 0455 05/22/14 0550  HGB 8.1*  --  7.3* 7.7*  HCT 26.9*  --  24.4* 26.2*  PLT 204  --  204 216  HEPARINUNFRC  --  0.33 0.34 0.41  CREATININE 1.66*  --  2.04* 2.57*    Estimated Creatinine Clearance: 24.8 mL/min (by C-G formula based on Cr of 2.57).   Medications:  Infusions:  . heparin 1,200 Units/hr (05/22/14 0600)  . propofol 40 mcg/kg/min (05/22/14 0600)    Assessment: 1 yoM admitted on 11/18 with epistaxis, and required intubation on 11/10 for dyspnea.  He has chronic recurrent epistaxis with PMH of mechanical heart valves (MVR, AVR) on Plavix and warfarin 2.5mg  daily except 2.5mg  on Mon/Wed/Fri.  INR was 2.28 on admission.  Pharmacy was consulted to dose IV heparin on 11/12 for mechanical heart valves and afib.  Significant events 11/12 Heparin started 11/14 Heparin stopped d/t penile bleeding at noon. 11/15 Heparin resumed, no further bleeding noted.   Today, 05/22/2014  Heparin level 0.41, remains therapeutic  CBC: Hgb 7.7 is slightly increased, Plt remain stable at 216  No further bleeding reported by RN or documented in progress notes  SCr significantly increased to 2.57, CrCl ~ 25 ml/min   Goal of Therapy:  Heparin level 0.3-0.7 units/ml Monitor platelets by anticoagulation protocol: Yes   Plan:   Continue heparin IV infusion at 1200 units/hr  Daily heparin level and CBC  Continue to monitor H&H and platelets   Gretta Arab PharmD, BCPS Pager  [phone] 05/22/2014 6:58 AM

## 2014-05-22 NOTE — Progress Notes (Signed)
PULMONARY / CRITICAL CARE MEDICINE   Name: [name] MRN: [id number] DOB: October 27, 1945    ADMISSION DATE:  05/17/2014 CONSULTATION DATE:  05/22/2014  REFERRING MD :  Rama  CHIEF COMPLAINT:  Respiratory failure in setting epistaxis  INITIAL PRESENTATION:  68 y.o. M brought to [facility] 11/8 with epistaxis.  Had rhinopack placed with temporary cessation of bleeding.  Seen by ENT 11/9 and had surgicel packing placed with apparent cessation of bleeding.  In early AM hours 11/10, developed worsening dyspnea despite NRB mask.  He was transferred to the ICU and intubated by CRNA for resp acidosis.     PMH - chronic 2L O2, frequent nosebleeds due to being on warfarin and plavix for hx of 2 mechanical heart valves.    STUDIES:    SIGNIFICANT EVENTS: 11/08  Admitted with epistaxis, required nasal packing 11/09  ENT consult 11/10  Transferred to ICU, intubated 11/14  Weaning, hematuria, hep held 11/16  No further hematuria or epistaxis, FiO2/PEEP increased to 50/8  SUBJECTIVE:  Decreased FiO2 / PEEP needs.  Increased sputum (tan to brown) and oozing blood from urethral meatus  VITAL SIGNS: Temp:  [98.8 F (37.1 C)-99.7 F (37.6 C)] 99.7 F (37.6 C) (11/17 0600) Pulse Rate:  [48-122] 107 (11/17 0600) Resp:  [21-32] 30 (11/17 0600) BP: (95-154)/(42-75) 104/54 mmHg (11/17 0600) SpO2:  [89 %-99 %] 94 % (11/17 0737) FiO2 (%):  [40 %-50 %] 40 % (11/17 0737) Weight:  [163 lb 12.8 oz (74.3 kg)] 163 lb 12.8 oz (74.3 kg) (11/17 0500)   HEMODYNAMICS: CVP:  [13 mmHg-15 mmHg] 15 mmHg   VENTILATOR SETTINGS: Vent Mode:  [-] CPAP FiO2 (%):  [40 %-50 %] 40 % Set Rate:  [18 bmp] 18 bmp Vt Set:  [380 mL] 380 mL PEEP:  [5 cmH20-8 cmH20] 5 cmH20 Pressure Support:  [10 cmH20] 10 cmH20 Plateau Pressure:  [10 cmH20-19 cmH20] 10 cmH20   INTAKE / OUTPUT: Intake/Output      11/16 0701 - 11/17 0700 11/17 0701 - 11/18 0700   I.V. (mL/kg) 895.2 (12)    NG/GT 1080    IV Piggyback 50    Total  Intake(mL/kg) 2025.2 (27.3)    Urine (mL/kg/hr) 1295 (0.7)    Stool 150 (0.1)    Total Output 1445     Net +580.2           PHYSICAL EXAMINATION: General: Elderly male, chronically ill appearing, calm, sedated on vent Neuro: Sedated on vent., rass -1 HEENT: [place]/AT. PERRL, sclerae anicteric.   Cardiovascular: IRIR and tachy, SEM unchanged, mechanical valvular click Lungs: CTA, good air entry, no rhonchi, crackles or wheeze, slightly diminished bilaterally at base Abdomen: BS x 4, soft, NT/ND.  Musculoskeletal: No gross deformities Skin: Multiple small skin excoriations with scabs, thin fragile skin, upper and lower extremity pitting edema 1+  LABS:  CBC  Recent Labs Lab 05/20/14 0400 05/21/14 0455 05/22/14 0550  WBC 6.9 9.3 8.1  HGB 8.1* 7.3* 7.7*  HCT 26.9* 24.4* 26.2*  PLT 204 204 216   Coag's  Recent Labs Lab 05/16/14 0338 05/17/14 0500 05/18/14 0400  INR 2.47* 1.99* 1.74*   BMET  Recent Labs Lab 05/20/14 0400 05/21/14 0455 05/22/14 0550  NA 143 141 145  K 3.6* 4.0 4.1  CL 99 99 100  CO2 32 30 30  BUN 75* 81* 93*  CREATININE 1.66* 2.04* 2.57*  GLUCOSE 130* 121* 115*   Electrolytes  Recent Labs Lab 05/16/14 0338  05/20/14 0400 05/21/14 0455  05/22/14 0550  CALCIUM 8.6  < > 9.1 9.3 9.3  MG 2.2  --   --  2.2 2.1  PHOS 3.6  --   --  4.9* 6.0*  < > = values in this interval not displayed.   Sepsis Markers  Recent Labs Lab 05/16/14 0348 05/17/14 0500  PROCALCITON 0.34 0.34   ABG  Recent Labs Lab 05/18/14 1013 05/20/14 2132 05/22/14 0424  PHART 7.411 7.413 7.344*  PCO2ART 48.9* 43.4 53.5*  PO2ART 54.9* 52.4* 70.2*   Liver Enzymes  Recent Labs Lab 05/20/14 0400  AST 75*  ALT 41  ALKPHOS 167*  BILITOT 0.6  ALBUMIN 2.5*   Glucose  Recent Labs Lab 05/21/14 0457 05/21/14 0748 05/21/14 1208 05/21/14 1629 05/21/14 1935 05/21/14 2259  GLUCAP 120* 139* 139* 104* 138* 130*   Imaging Dg Chest Port 1 View  05/21/2014    CLINICAL DATA:  68 year old male with acute respiratory acidosis. Atrial fibrillation. Congestive heart failure. Coronary artery disease. COPD. Subsequent encounter.  EXAM: PORTABLE CHEST - 1 VIEW  COMPARISON:  05/20/2014.  FINDINGS: Endotracheal tube tip 4.4 cm above the carina.  Right central line tip mid superior vena cava level.  Nasogastric tube courses below the diaphragm. Tip is not included on the present exam.  Post mitral and aortic valve replacement.  Cardiomegaly.  Pulmonary vascular congestion/ pulmonary edema with slightly improved aeration left lung base which may represent shifting pleural fluid.  Basal atelectasis suspected. Difficult to exclude basilar infiltrate given this appearance.  Calcified tortuous aorta.  IMPRESSION: Slight improved aeration left lung base may represent shifting pleural fluid. Remainder of findings without significant change as noted above.   Electronically Signed   By: Chauncey Cruel M.D.   On: 05/21/2014 07:40   ASSESSMENT / PLAN:  PULMONARY OETT 11/10 >>> A: Acute on chronic hypoxic respiratory failure ARDS - PaO2/FiO2 ratio consistent with moderate ARDS COPD without evidence of exacerbation Likely blood aspiration rather than Pulmonary edema Small b/l pleural effusions Tobacco use  P:   Daily SBT / WUA ARDS protocol keep plat less 30  Wean PEEP / FiO2 per protocol - unable to wean with propofol turned down, agitation  Trend CXR VAP bundle DuoNebs + Q3 PRN Albuterol Lasix held 11/16 pm, slight positive fluid balance without increase in pulmonary edema/pleural effusions -clinically and per CXR Trend CXR  CARDIOVASCULAR A:  A.fib - rate controlled Hx mechanical MVR and AVR in 2003 Chronic anticoagulation due to above Acute diastolic CHF Hx  HTN, HLD P:  Hold further lasix 11/17 Coumadin / Plavix currently on hold - does he need plavix given recurrent nose bleeds?  Heparin tolerated thus far, restarted by cards.  Monitor urethral bleeding  (small amt oozing).   Tele Monitoring   RENAL A:   AKI - increase in BUN/sCr with lasix, good U/O CKD - stable Hyponatremia - resolved P:   NS @ KVO. Trend BMP. Lasix held Replace electrolytes as indicated.  GASTROINTESTINAL A:   GERD Nutrition P:   Pantoprazole for SUP TF per nutrition   HEMATOLOGIC A:   Anemia - chronic + dilutional component  - slow steady drop since admission, positive FOB, blood from urethral meatus per RN On chronic anticoagulation VTE Prophylaxis P:  Transfuse for Hgb < 7. SCD's only. IV heparin per cards, follow closely for reoccurrence of bleeding  FOB stool (positive) 11/16 Consider GI evaluation  INFECTIOUS A:   Concern for aspiration PNA - Tmax 100 11/17, with increased tan/brown secretions P:  Sputum Cx 11/10 >>>ng  Sputum 11/17 >>   Abx: Vanc, start date 11/10 >> 11/11 Abx: Zosyn, start date 11/10>>>11/14 Ceftriaxone 11/14 >>>plan total 7 days, stop date in place  Assess PCT protocol   ENDOCRINE A:   No known issues P:   Monitor glucose on BMP.  NEUROLOGIC A:   Acute metabolic encephalopathy ETOH abuse P:   Sedation:  Propofol gtt / Fentanyl PRN. RASS goal: 0 to -1. Daily WUA. Thiamine / Folate / Multivitamin. Wean propofol for goal RASS above   INTEGUMENTARY A: Rash - resolved  Noe Gens, NP-C [facility] Pgr: [phone] or [phone]

## 2014-05-22 NOTE — Progress Notes (Signed)
PULMONARY / CRITICAL CARE MEDICINE   Name: [name] MRN: [id number] DOB: 20-May-1946    ADMISSION DATE:  05/23/2014 CONSULTATION DATE:  05/22/2014  REFERRING MD :  Rama  CHIEF COMPLAINT:  Respiratory failure in setting epistaxis  INITIAL PRESENTATION:  68 y.o. M brought to [facility] 11/8 with epistaxis.  Had rhinopack placed with temporary cessation of bleeding.  Seen by ENT 11/9 and had surgicel packing placed with apparent cessation of bleeding.  In early AM hours 11/10, developed worsening dyspnea despite NRB mask.  He was transferred to the ICU and intubated by CRNA for resp acidosis.     PMH - chronic 2L O2, frequent nosebleeds due to being on warfarin and plavix for hx of 2 mechanical heart valves.    STUDIES:    SIGNIFICANT EVENTS: 11/08  Admitted with epistaxis, required nasal packing 11/09  ENT consult 11/10  Transferred to ICU, intubated 11/14  Weaning, hematuria, hep held 11/16  No further hematuria or epistaxis, FiO2/PEEP increased to 50/8 11/17  Increased respiratory secretions  SUBJECTIVE:   Tolerating pressure support 10/5.  VITAL SIGNS: Temp:  [98.8 F (37.1 C)-100.2 F (37.9 C)] 100.2 F (37.9 C) (11/17 1000) Pulse Rate:  [48-122] 100 (11/17 1000) Resp:  [21-34] 28 (11/17 1000) BP: (95-143)/(42-75) 98/46 mmHg (11/17 1000) SpO2:  [89 %-98 %] 89 % (11/17 1000) FiO2 (%):  [40 %-50 %] 40 % (11/17 1000) Weight:  [163 lb 12.8 oz (74.3 kg)] 163 lb 12.8 oz (74.3 kg) (11/17 0500)   HEMODYNAMICS: CVP:  [15 mmHg] 15 mmHg   VENTILATOR SETTINGS: Vent Mode:  [-] CPAP FiO2 (%):  [40 %-50 %] 40 % Set Rate:  [18 bmp] 18 bmp Vt Set:  [380 mL] 380 mL PEEP:  [5 cmH20-8 cmH20] 5 cmH20 Pressure Support:  [10 cmH20] 10 cmH20 Plateau Pressure:  [10 cmH20-19 cmH20] 10 cmH20   INTAKE / OUTPUT: Intake/Output      11/16 0701 - 11/17 0700 11/17 0701 - 11/18 0700   I.V. (mL/kg) 934.3 (12.6) 117.3 (1.6)   NG/GT 1130 150   IV Piggyback 50 50   Total Intake(mL/kg) 2114.3  (28.5) 317.3 (4.3)   Urine (mL/kg/hr) 1295 (0.7) 105 (0.4)   Stool 150 (0.1)    Total Output 1445 105   Net +669.3 +212.3         PHYSICAL EXAMINATION: General: no distress Neuro: RASS -1 HEENT: ETT in place Cardiovascular: irregular, 2/6 systolic click Lungs: scattered rhonchi Abdomen: soft, non tender Musculoskeletal: 1+ edema Skin: multiple areas of ecchymoses  LABS:  CBC  Recent Labs Lab 05/20/14 0400 05/21/14 0455 05/22/14 0550  WBC 6.9 9.3 8.1  HGB 8.1* 7.3* 7.7*  HCT 26.9* 24.4* 26.2*  PLT 204 204 216   Coag's  Recent Labs Lab 05/16/14 0338 05/17/14 0500 05/18/14 0400  INR 2.47* 1.99* 1.74*   BMET  Recent Labs Lab 05/20/14 0400 05/21/14 0455 05/22/14 0550  NA 143 141 145  K 3.6* 4.0 4.1  CL 99 99 100  CO2 32 30 30  BUN 75* 81* 93*  CREATININE 1.66* 2.04* 2.57*  GLUCOSE 130* 121* 115*   Electrolytes  Recent Labs Lab 05/16/14 0338  05/20/14 0400 05/21/14 0455 05/22/14 0550  CALCIUM 8.6  < > 9.1 9.3 9.3  MG 2.2  --   --  2.2 2.1  PHOS 3.6  --   --  4.9* 6.0*  < > = values in this interval not displayed.   Sepsis Markers  Recent Labs Lab 05/16/14  0348 05/17/14 0500  PROCALCITON 0.34 0.34   ABG  Recent Labs Lab 05/18/14 1013 05/20/14 2132 05/22/14 0424  PHART 7.411 7.413 7.344*  PCO2ART 48.9* 43.4 53.5*  PO2ART 54.9* 52.4* 70.2*   Liver Enzymes  Recent Labs Lab 05/20/14 0400  AST 75*  ALT 41  ALKPHOS 167*  BILITOT 0.6  ALBUMIN 2.5*   Glucose  Recent Labs Lab 05/21/14 0457 05/21/14 0748 05/21/14 1208 05/21/14 1629 05/21/14 1935 05/21/14 2259  GLUCAP 120* 139* 139* 104* 138* 130*   Imaging Dg Chest Port 1 View  05/22/2014   CLINICAL DATA:  Acute respiratory failure  EXAM: PORTABLE CHEST - 1 VIEW  COMPARISON:  Yesterday  FINDINGS: Stable tubular devices. Stable pulmonary edema. Stable bilateral pleural effusions. Stable cardiomegaly. No pneumothorax.  IMPRESSION: Stable CHF.   Electronically Signed   By:  Maryclare Bean M.D.   On: 05/22/2014 07:56   Dg Chest Port 1 View  05/21/2014   CLINICAL DATA:  68 year old male with acute respiratory acidosis. Atrial fibrillation. Congestive heart failure. Coronary artery disease. COPD. Subsequent encounter.  EXAM: PORTABLE CHEST - 1 VIEW  COMPARISON:  05/20/2014.  FINDINGS: Endotracheal tube tip 4.4 cm above the carina.  Right central line tip mid superior vena cava level.  Nasogastric tube courses below the diaphragm. Tip is not included on the present exam.  Post mitral and aortic valve replacement.  Cardiomegaly.  Pulmonary vascular congestion/ pulmonary edema with slightly improved aeration left lung base which may represent shifting pleural fluid.  Basal atelectasis suspected. Difficult to exclude basilar infiltrate given this appearance.  Calcified tortuous aorta.  IMPRESSION: Slight improved aeration left lung base may represent shifting pleural fluid. Remainder of findings without significant change as noted above.   Electronically Signed   By: Chauncey Cruel M.D.   On: 05/21/2014 07:40   ASSESSMENT / PLAN:  PULMONARY OETT 11/10 >>> A: Acute on chronic hypoxic respiratory failure 2nd to aspiration with ARDS. Hx of COPD with continue tobacco abuse. P:   Pressure support wean as tolerated Follow up CXR Scheduled BD's  CARDIOVASCULAR A:  A fib with RVR. Acute on chronic diastolic CHF. Hx of HTN, HLD, s/p MVR and AVR in 2003. Hypotension 11/17 >> likely from diuresis. P:  Hold lasix Monitor hemodynamics Continue heparin gtt Continue lopressor, lipitor Plavix d/c'ed  RENAL A:   AKI likely 2nd to diuresis. Hyponatremia - resolved. P:   Monitor renal fx, urine outpt  GASTROINTESTINAL A:   GERD. Nutrition. P:   Pantoprazole for SUP TF per nutrition   HEMATOLOGIC A:   Anemia of critical illness and chronic disease. Epistaxis >> resolved. P:  F/u CBC Check iron levels  INFECTIOUS A:   Aspiration PNA with ARDS. Increased  respiratory secretions 11/17. P:   Day 9 of Abx, currently on rocephin  Sputum 11/17 >>   ENDOCRINE A:   No acute issues. P:   Monitor glucose on BMP  NEUROLOGIC A:   Acute metabolic encephalopathy 2nd to respiratory failure. Hx of ETOH abuse. P:   Sedation:  Propofol gtt / Fentanyl PRN RASS goal: -1 Daily WUA Thiamine / Folate / Multivitamin  INTEGUMENTARY A: Rash - resolved  Family updated: Updated 11/17.  Interdisciplinary Family Meeting v Palliative Care Meeting:  Completed 11/17.  CC time 40 minutes.  Chesley Mires, MD [facility] Pulmonary/Critical Care 05/22/2014, 11:16 AM Pager:  [phone] After 3pm call: [phone]

## 2014-05-23 ENCOUNTER — Inpatient Hospital Stay (HOSPITAL_COMMUNITY): Payer: Medicare Other

## 2014-05-23 LAB — BASIC METABOLIC PANEL
Anion gap: 15 (ref 5–15)
BUN: 100 mg/dL — AB (ref 6–23)
CALCIUM: 9.2 mg/dL (ref 8.4–10.5)
CO2: 28 mEq/L (ref 19–32)
CREATININE: 3.05 mg/dL — AB (ref 0.50–1.35)
Chloride: 102 mEq/L (ref 96–112)
GFR calc Af Amer: 23 mL/min — ABNORMAL LOW (ref >90)
GFR calc non Af Amer: 20 mL/min — ABNORMAL LOW (ref >90)
GLUCOSE: 128 mg/dL — AB (ref 70–99)
Potassium: 4.2 mEq/L (ref 3.7–5.3)
Sodium: 145 mEq/L (ref 137–147)

## 2014-05-23 LAB — PROCALCITONIN: Procalcitonin: 0.4 ng/mL

## 2014-05-23 LAB — CBC
HEMATOCRIT: 25.5 % — AB (ref 39.0–52.0)
HEMOGLOBIN: 7.5 g/dL — AB (ref 13.0–17.0)
MCH: 28 pg (ref 26.0–34.0)
MCHC: 29.4 g/dL — AB (ref 30.0–36.0)
MCV: 95.1 fL (ref 78.0–100.0)
Platelets: 232 10*3/uL (ref 150–400)
RBC: 2.68 MIL/uL — ABNORMAL LOW (ref 4.22–5.81)
RDW: 19.6 % — ABNORMAL HIGH (ref 11.5–15.5)
WBC: 8.3 10*3/uL (ref 4.0–10.5)

## 2014-05-23 LAB — GLUCOSE, CAPILLARY
GLUCOSE-CAPILLARY: 132 mg/dL — AB (ref 70–99)
GLUCOSE-CAPILLARY: 152 mg/dL — AB (ref 70–99)
GLUCOSE-CAPILLARY: 123 mg/dL — AB (ref 70–99)
Glucose-Capillary: 119 mg/dL — ABNORMAL HIGH (ref 70–99)
Glucose-Capillary: 114 mg/dL — ABNORMAL HIGH (ref 70–99)

## 2014-05-23 LAB — URINALYSIS, ROUTINE W REFLEX MICROSCOPIC
BILIRUBIN URINE: NEGATIVE
Glucose, UA: NEGATIVE mg/dL
HGB URINE DIPSTICK: NEGATIVE
KETONES UR: NEGATIVE mg/dL
Nitrite: NEGATIVE
Protein, ur: NEGATIVE mg/dL
Specific Gravity, Urine: 1.019 (ref 1.005–1.030)
UROBILINOGEN UA: 0.2 mg/dL (ref 0.0–1.0)
pH: 5 (ref 5.0–8.0)

## 2014-05-23 LAB — SODIUM, URINE, RANDOM: SODIUM UR: 32 meq/L

## 2014-05-23 LAB — URINE MICROSCOPIC-ADD ON

## 2014-05-23 LAB — CREATININE, URINE, RANDOM: Creatinine, Urine: 90 mg/dL

## 2014-05-23 LAB — OCCULT BLOOD X 1 CARD TO LAB, STOOL: Fecal Occult Bld: POSITIVE — AB

## 2014-05-23 LAB — HEPARIN LEVEL (UNFRACTIONATED): Heparin Unfractionated: 0.47 IU/mL (ref 0.30–0.70)

## 2014-05-23 MED ORDER — FERROUS SULFATE 220 (44 FE) MG/5ML PO ELIX
220.0000 mg | ORAL_SOLUTION | Freq: Two times a day (BID) | ORAL | Status: DC
  Filled 2014-05-23 (×2): qty 5

## 2014-05-23 MED ORDER — METOPROLOL TARTRATE 25 MG/10 ML ORAL SUSPENSION
50.0000 mg | Freq: Two times a day (BID) | ORAL | Status: DC
  Administered 2014-05-23 – 2014-05-25 (×5): 50 mg
  Filled 2014-05-23 (×8): qty 20

## 2014-05-23 MED ORDER — FERROUS SULFATE 300 (60 FE) MG/5ML PO SYRP
300.0000 mg | ORAL_SOLUTION | Freq: Two times a day (BID) | ORAL | Status: DC
  Administered 2014-05-23 – 2014-05-26 (×7): 300 mg via ORAL
  Filled 2014-05-23 (×8): qty 5

## 2014-05-23 MED ORDER — FUROSEMIDE 10 MG/ML IJ SOLN
160.0000 mg | Freq: Once | INTRAVENOUS | Status: AC
  Administered 2014-05-23: 160 mg via INTRAVENOUS
  Filled 2014-05-23: qty 16

## 2014-05-23 NOTE — Consult Note (Addendum)
Renal Service Consult Note [name]  [name] 05/23/2014 [name] Requesting Physician:  [name]  Reason for Consult:  Acute renal failure HPI: The patient is a 68 y.o. year-old presented on 11/8 w nosebleed, is on home O2.  On plavix and coumadin. Admitted , anticoagulation was to be reversed.  Nares packed. Bleeding appeared to have stopped, but on 11/10 developed worsening dyspnea and required intubation for resp acidosis. Current dx is ARDS.  Hx COPD with continued tobacco use. On 11/14 had hematuria.  Creatinine on admission was 1.37, went up to 2.23 on 11/12 then improved to 1.66 on 11/15, now rising again up to 3.05 today. BUN 100.  UOP yest was 800 cc.  Baseline creat 1.2 - 1.5 from earlier this year w eGFR of 45 - 60 (stage 3a CKD).    Inpt meds -- lipitor, Rocephin, lasix, MTP, duoneb, MVI, PPI, zosyn (off), torsemide , vanc (dc'd 11/11), diprivan  Chart review: 4/03 - chest pain, CAD > underwent 3V CABG and St Jude aortic valve replacement. C/B afib, rx with coumadin, BB, dig amio.  8/03 - CHF exacerbation, rx with diuresis 9/06 - anemia, poss hemolysis from AoV prosthesis > trasnfused, anemia w/u w low iron levels. Plan outpt GI w/u.  1/11 - epistaxis requiring surgical intervention, hx aortic and mitral metallic valve replacement, anemia , HTN 3/11 - recurrent epistaxis, CAD hx CABG, hx AVR/MVR, etoh abuse > trreated by ENT w sphenopalatine artery ligation and cautery 11/12 - acute diast CHF , treated with diuresis, EF 60% 7/14 - rapd afib +EKG changes , +trop, diast HF > heart cath showed SVG closure, patent LIMA. Underwent DES to native RCA. NOrmal EF by echo. Diuresed. Converted to NSR. Declined OP rehab. Rx coumadin for valves.  7/14 - acute/chronic diast HF, NSTEMI > diuresed 7kg, +trop but not cath'd , due to demand ischemia 11/14 - CHF flare with hyponatremia > rx with Laxis and improved. Kayexalate for hyperkalemia. 1/15 - resp  distress requiring intubation, brief cardiac arrest prior to intubation > +NSTEMI, COPD, diuresed by cardiology, improved. afib with RVR, cont coumadin.  2/9 - 08/19/13 > acte/chronic diast CHF, low Na, MVR/AVR > treat w IV lasix, improved . Fluid /Na restrictoin at home.  5/24 - 12/06/13 > seizur-like activity, hypoxemia, acute pulm edema > intubated for 5 days, diuresed, chronic afib. DC to SNF for rehab.   8/2 - 02/12/14 > nosebleed, chronic nasal O2 at home > acute pulm edema, resp failure, intubated , mech ventilation x 2 days, then improved. DC"d home thereafter.     Past Medical History  Past Medical History  Diagnosis Date  . Essential hypertension, benign   . Coronary atherosclerosis of native coronary artery     a. CABG/MVR/AVR-2003 (#25 St. Jude/#21 St. Jude); anticoagulation; b. negative stress nuclear-2005;  c. 01/2013 NSTEMI/Cath/PCI: LM nl, LAD 50p, 40-35m, D1 50ost, LCX 30m, RCA 50/42m (2.5x20 Promus DES), 50d, VG->Diag 100, VG->PDA 100, VG->OM 100, LIMA->LAD ok.  . Epistaxis     Requiring cautery & aterial ligation-09/2009  . GERD (gastroesophageal reflux disease)   . Hyperlipemia   . Abnormal LFTs     Possible cirrhosis  . Chronic anticoagulation   . Valvular heart disease     a. 2003: MVR/AVR-2003 (#25 St. Jude/#21 St. Jude);  b. 01/2013 Echo: EF 55%, Mech AVR mean grad 12, Mech MVR mean grad 6.  . Tobacco abuse     45 pack years  . Fasting hyperglycemia   .  Nephrolithiasis   . Diverticulosis   . Hyponatremia   . Chronic diastolic CHF (congestive heart failure)   . Chronic renal disease   . Hemolytic anemia   . ETOH abuse   . Atrial fibrillation 01/16/2013    On coumadin DCCV 07/2013.   Marland Kitchen COPD (chronic obstructive pulmonary disease)   . Stage III chronic kidney disease    Past Surgical History  Past Surgical History  Procedure Laterality Date  . Endocopic sphenopalatine artery ligation & cautry    . Inguinal hernia repair      Left & right  . Coronary artery  bypass graft  11/2001    [facility]  . Cardiac valve replacement  11/2001    AVR and MVR-St. Jude devices  . Cataract extraction w/phaco  01/20/2011    Procedure: CATARACT EXTRACTION PHACO AND INTRAOCULAR LENS PLACEMENT (IOC);  Surgeon: [name];  Location: [facility] ORS;  Service: Ophthalmology;  Laterality: Right;  CDE: 8.51  . Cataract extraction w/phaco  02/03/2011    Procedure: CATARACT EXTRACTION PHACO AND INTRAOCULAR LENS PLACEMENT (IOC);  Surgeon: [name];  Location: [facility] ORS;  Service: Ophthalmology;  Laterality: Left;  CDE:10.01  . Colonoscopy  04/05/02; 08/2011    friable anal canal hemorrhoids otherwise normal; 2 diminutive polyps excised, minimal diverticulosis noted  . Esophagogastroduodenoscopy  01/2002    [name]. Laural [name], submucosal esophageal lesion c/w leiomyoma  . Colonoscopy  09/02/2011    Procedure: COLONOSCOPY;  Surgeon: [name] Dolin, MD;  Location: [facility] ENDO SUITE;  Service: Endoscopy;  Laterality: N/A;  8:15  . Yag laser application Right 26/94/8546    Procedure: YAG LASER APPLICATION;  Surgeon: [name] Guadeloupe T. Gershon Crane, MD;  Location: [facility] ORS;  Service: Ophthalmology;  Laterality: Right;   Family History  Family History  Problem Relation Age of Onset  . Hypotension Neg Hx   . Anesthesia problems Neg Hx   . Malignant hyperthermia Neg Hx   . Pseudochol deficiency Neg Hx   . Colon cancer Neg Hx   . Liver disease Neg Hx    Social History  reports that he quit smoking about 4 months ago. His smoking use included Cigarettes. He has a 13 pack-year smoking history. He quit smokeless tobacco use about 5 months ago. He reports that he drinks about 4.2 oz of alcohol per week. He reports that he does not use illicit drugs. Allergies No Known Allergies Home medications Prior to Admission medications   Medication Sig Start Date End Date Taking? Authorizing Provider  amLODipine (NORVASC) 10 MG tablet Take 10 mg by mouth at bedtime.  12/15/13  Yes Historical Provider, MD   atorvastatin (LIPITOR) 80 MG tablet Take 1 tablet (80 mg total) by mouth daily. 03/13/14  Yes [name] Lenis, MD  clopidogrel (PLAVIX) 75 MG tablet Take 75 mg by mouth daily.   Yes Historical Provider, MD  ferrous sulfate 325 (65 FE) MG EC tablet Take 325 mg by mouth daily with breakfast.   Yes Historical Provider, MD  folic acid (FOLVITE) 1 MG tablet Take 1 tablet (1 mg total) by mouth daily. 01/22/13  Yes Brooke O Edmisten, PA-C  levalbuterol (XOPENEX) 0.63 MG/3ML nebulizer solution Take 3 mLs (0.63 mg total) by nebulization every 6 (six) hours as needed for wheezing or shortness of breath. 01/22/13  Yes Brooke O Edmisten, PA-C  loperamide (IMODIUM) 2 MG capsule Take 2 capsules (4 mg total) by mouth every 12 (twelve) hours as needed for diarrhea or loose stools. 12/06/13  Yes Percell Miller  Emilie Rutter, MD  magnesium oxide (MAG-OX) 400 MG tablet Take 400 mg by mouth daily.   Yes Historical Provider, MD  metolazone (ZAROXOLYN) 2.5 MG tablet Take 2.5 mg by mouth as needed (Fluid).   Yes Historical Provider, MD  metoprolol (LOPRESSOR) 100 MG tablet Take 100 mg by mouth 2 (two) times daily.   Yes Historical Provider, MD  Multiple Vitamin (MULTIVITAMIN WITH MINERALS) TABS Take 1 tablet by mouth daily. 01/22/13  Yes Brooke O Edmisten, PA-C  pantoprazole (PROTONIX) 40 MG tablet Take 40 mg by mouth daily.   Yes Historical Provider, MD  potassium chloride SA (K-DUR,KLOR-CON) 20 MEQ tablet Take 1 tablet (20 mEq total) by mouth daily. 12/18/13  Yes [name] Lenis, MD  thiamine 100 MG tablet Take 1 tablet (100 mg total) by mouth daily. 01/22/13  Yes Brooke O Edmisten, PA-C  torsemide (DEMADEX) 20 MG tablet Take 3 tablets (60 mg total) by mouth 2 (two) times daily. 03/13/14  Yes [name] Lenis, MD  warfarin (COUMADIN) 5 MG tablet Take 2.5-5 mg by mouth daily. Takes 5 mg on Mon, Wed, Fri and takes 2.5 mg all other days.   Yes Historical Provider, MD  acetaminophen (TYLENOL) 500 MG tablet Take 1,000 mg by mouth daily as  needed for mild pain or headache.    Historical Provider, MD  clopidogrel (PLAVIX) 75 MG tablet TAKE ONE TABLET BY MOUTH DAILY WITH BREAKFAST Patient not taking: Reported on 05/19/2014 03/01/14   [name] Lenis, MD  magnesium oxide (MAG-OX) 400 (241.3 MG) MG tablet TAKE ONE TABLET BY MOUTH ONCE DAILY Patient not taking: Reported on 05/10/2014 05/08/14   [name] Lenis, MD  metolazone (ZAROXOLYN) 2.5 MG tablet Take 2.5 mg (1 tablet) every week as needed for swelling. Patient not taking: Reported on 05/20/2014 01/26/14   [name] Lenis, MD  metoprolol (LOPRESSOR) 100 MG tablet TAKE ONE TABLET BY MOUTH TWICE DAILY Patient not taking: Reported on 05/22/2014 03/01/14   [name] Lenis, MD  nitroGLYCERIN (NITROSTAT) 0.4 MG SL tablet Place 1 tablet (0.4 mg total) under the tongue every 5 (five) minutes x 3 doses as needed for chest pain. 01/31/13   Rogelia Mire, NP  warfarin (COUMADIN) 5 MG tablet Take 1-1.5 tablets (5-7.5 mg total) by mouth daily. Takes 7.$RemoveBefore'5mg'[id number]$  on Mondays & $RemoveB'5mg'LGmISqzy$  all other days of week Patient not taking: Reported on 06/02/2014 05/08/14   [name] Lenis, MD   Liver Function Tests  Recent Labs Lab 05/20/14 0400  AST 75*  ALT 41  ALKPHOS 167*  BILITOT 0.6  PROT 7.1  ALBUMIN 2.5*   No results for input(s): LIPASE, AMYLASE in the last 168 hours. CBC  Recent Labs Lab 05/20/14 0400 05/21/14 0455 05/22/14 0550 05/23/14 0507  WBC 6.9 9.3 8.1 8.3  NEUTROABS 5.6 7.4  --   --   HGB 8.1* 7.3* 7.7* 7.5*  HCT 26.9* 24.4* 26.2* 25.5*  MCV 93.1 95.3 94.6 95.1  PLT 204 204 216 144   Basic Metabolic Panel  Recent Labs Lab 05/17/14 0500 05/18/14 0400 05/19/14 0200 05/20/14 0400 05/21/14 0455 05/22/14 0550 05/23/14 0507  NA 139 139 140 143 141 145 145  K 4.1 3.7 3.6* 3.6* 4.0 4.1 4.2  CL 96 96 96 99 99 100 102  CO2 $Re'31 31 31 'IwE$ 32 $R'30 30 28  'iH$ GLUCOSE 123* 128* 114* 130* 121* 115* 128*  BUN 74* 75* 71* 75* 81* 93* 100*  CREATININE 2.23* 1.96* 1.74* 1.66* 2.04* 2.57*  3.05*  CALCIUM 8.4  8.7 8.9 9.1 9.3 9.3 9.2  PHOS  --   --   --   --  4.9* 6.0*  --     Filed Vitals:   05/23/14 0900 05/23/14 1000 05/23/14 1056 05/23/14 1100  BP: 127/62 134/69  137/68  Pulse: 115 56 117 51  Temp: 100.4 F (38 C) 100.6 F (38.1 C)  100.8 F (38.2 C)  TempSrc:      Resp: 32 14  16  Height:      Weight:      SpO2: 90% 95%  96%   Exam On vent ,sedated No rash, cyanosis or gangrene Sclera anicteric, throat clear No jvd Chest mostly clear , some coarse wheezing w coughing  Irreg rhythm, no MRG Abd soft, NDNT, +BS, no ascites 2+ penile and scrotal edema 2+ dependent edema hips/ flanks 1+ edema lower legs bilat Neuro is sedated, not responsive  CVP 12 , 15, 19 Wt 67kg on 11/8, 74kg today I/O +10 liters since admit UOP 800 cc yest, 258 cc today CXR - CHF, stable Current meds - lipitor, Rocephin, feSO4, folic acid, duoneb, Lopressor, MVI, protonix UA (undersigned) > 1.015, 5.0, moderate gran casts, 4-8 non dysmorphic rbc's and 3-5 wbc's per HPF, +bacteria , no epis; dip no protein, blood, neg LE/ nit UNa 32, Ucreat 90  Assessment: 1 Acute on CKD 3 - unclear cause, sediment/UA suggesting ATN, or possibly AIN. Prerenal from diuresis a possibility but pt does not look vol depleted.  No nephrotoxins noted.  Check urine culture, will follow. Suspect he very well may worsen. Not sure how aggressive we should be, would consider getting GOC established with family.  Give one dose IV lasix 160 mg 2 CKD 3 baseline creat 1.2- 1.5 3 CAD hx CABG, and subsequent stent to native RCA 4 Hx diast CHF, several hosp admissions 5 S/P MVR and AVR  6 COPD/ chronic lung disease - on home O2 7 Volume - looks vol overloaded   Plan- urine eosinophils, urine cx, supportive care. Get echo. Lasix IV 160 mg x 1. Consider pall care eval for GOC.  Will follow  Kelly Splinter MD (pgr) [phone]    [phone] 05/23/2014, 12:46 PM

## 2014-05-23 NOTE — Plan of Care (Signed)
Problem: Phase I Progression Outcomes Goal: Progressing towards optiumm acitivities Outcome: Not Progressing Goal: Patient tolerating weaning plan Outcome: Not Progressing

## 2014-05-23 NOTE — Progress Notes (Signed)
Urine output low (15 cc for 2 hrs); Dr. Halford Chessman informed.  CVP 18.  Varnell Donate, Beverly Gust, RN

## 2014-05-23 NOTE — Progress Notes (Signed)
PULMONARY / CRITICAL CARE MEDICINE   Name: RAKEEN GAILLARD MRN: [id number] DOB: October 27, 1945    ADMISSION DATE:  05/29/2014 CONSULTATION DATE:  05/23/2014  REFERRING MD :  Rama  CHIEF COMPLAINT:  Respiratory failure in setting epistaxis  INITIAL PRESENTATION:  68 y.o. M brought to [facility] 11/8 with epistaxis.  Had rhinopack placed with temporary cessation of bleeding.  Seen by ENT 11/9 and had surgicel packing placed with apparent cessation of bleeding.  In early AM hours 11/10, developed worsening dyspnea despite NRB mask.  He was transferred to the ICU and intubated by CRNA for resp acidosis.     PMH - chronic 2L O2, frequent nosebleeds due to being on warfarin and plavix for hx of 2 mechanical heart valves.    STUDIES:    SIGNIFICANT EVENTS: 11/08  Admitted with epistaxis, required nasal packing 11/09  ENT consult 11/10  Transferred to ICU, intubated 11/14  Weaning, hematuria, hep held 11/16  No further hematuria or epistaxis, FiO2/PEEP increased to 50/8 11/17  Increased respiratory secretions  SUBJECTIVE:   Tolerating pressure support 10/5.  Decreased urine outpt.  VITAL SIGNS: Temp:  [98.8 F (37.1 C)-100.4 F (38 C)] 100 F (37.8 C) (11/18 0600) Pulse Rate:  [58-132] 88 (11/18 0600) Resp:  [17-42] 23 (11/18 0600) BP: (92-157)/(41-77) 111/41 mmHg (11/18 0600) SpO2:  [85 %-97 %] 93 % (11/18 0600) FiO2 (%):  [40 %] 40 % (11/18 0437) Weight:  [163 lb 5.8 oz (74.1 kg)] 163 lb 5.8 oz (74.1 kg) (11/18 0500)   HEMODYNAMICS:     VENTILATOR SETTINGS: Vent Mode:  [-] PRVC FiO2 (%):  [40 %] 40 % Set Rate:  [20 bmp] 20 bmp Vt Set:  [380 mL] 380 mL PEEP:  [5 cmH20] 5 cmH20 Pressure Support:  [10 cmH20-410 cmH20] 410 cmH20 Plateau Pressure:  [9 cmH20-16 cmH20] 11 cmH20   INTAKE / OUTPUT: Intake/Output      11/17 0701 - 11/18 0700 11/18 0701 - 11/19 0700   I.V. (mL/kg) 906.4 (12.2)    NG/GT 1230    IV Piggyback 550    Total Intake(mL/kg) 2686.4 (36.3)    Urine (mL/kg/hr)  804 (0.5)    Stool 300 (0.2)    Total Output 1104     Net +1582.4           PHYSICAL EXAMINATION: General: no distress Neuro: RASS -1 HEENT: ETT in place Cardiovascular: irregular, 2/6 systolic click Lungs: scattered rhonchi Abdomen: soft, non tender Musculoskeletal: 1+ edema Skin: multiple areas of ecchymoses  LABS:  CBC  Recent Labs Lab 05/21/14 0455 05/22/14 0550 05/23/14 0507  WBC 9.3 8.1 8.3  HGB 7.3* 7.7* 7.5*  HCT 24.4* 26.2* 25.5*  PLT 204 216 232   Coag's  Recent Labs Lab 05/17/14 0500 05/18/14 0400  INR 1.99* 1.74*   BMET  Recent Labs Lab 05/21/14 0455 05/22/14 0550 05/23/14 0507  NA 141 145 145  K 4.0 4.1 4.2  CL 99 100 102  CO2 30 30 28   BUN 81* 93* 100*  CREATININE 2.04* 2.57* 3.05*  GLUCOSE 121* 115* 128*   Electrolytes  Recent Labs Lab 05/21/14 0455 05/22/14 0550 05/23/14 0507  CALCIUM 9.3 9.3 9.2  MG 2.2 2.1  --   PHOS 4.9* 6.0*  --      Sepsis Markers  Recent Labs Lab 05/17/14 0500 05/22/14 0550 05/23/14 0507  PROCALCITON 0.34 0.41 0.40   ABG  Recent Labs Lab 05/18/14 1013 05/20/14 2132 05/22/14 0424  PHART 7.411 7.413 7.344*  PCO2ART 48.9* 43.4 53.5*  PO2ART 54.9* 52.4* 70.2*   Liver Enzymes  Recent Labs Lab 05/20/14 0400  AST 75*  ALT 41  ALKPHOS 167*  BILITOT 0.6  ALBUMIN 2.5*   Glucose  Recent Labs Lab 05/22/14 0735 05/22/14 1208 05/22/14 1600 05/22/14 1947 05/22/14 2329 05/23/14 0356  GLUCAP 131* 141* 119* 117* 126* 132*    Iron/TIBC/Ferritin/ %Sat    Component Value Date/Time   IRON 38* 05/22/2014 1130   TIBC 320 05/22/2014 1130   FERRITIN 224 05/22/2014 1130   IRONPCTSAT 12* 05/22/2014 1130    Imaging Dg Chest Port 1 View  05/23/2014   CLINICAL DATA:  68 year old male with acute respiratory failure. Initial encounter.  EXAM: PORTABLE CHEST - 1 VIEW  COMPARISON:  05/22/2014 and earlier.  FINDINGS: Portable AP semi upright view at 0441 hrs. Stable endotracheal tube, visible  enteric tube, and right IJ central line. Veiling bilateral lung base opacities in keeping with pleural effusions as well as continued confluent retrocardiac opacity due to a lower lobe collapse or consolidation. No pneumothorax. Stable pulmonary vascularity without overt edema. Stable cardiomegaly and mediastinal contours. Sequelae of CABG and cardiac valve replacement.  IMPRESSION: 1.  Stable lines and tubes. 2. Stable ventilation; Bilateral pleural effusions with lower lobe collapse or consolidation.   Electronically Signed   By: Lars Pinks M.D.   On: 05/23/2014 06:17   Dg Chest Port 1 View  05/22/2014   CLINICAL DATA:  Acute respiratory failure  EXAM: PORTABLE CHEST - 1 VIEW  COMPARISON:  Yesterday  FINDINGS: Stable tubular devices. Stable pulmonary edema. Stable bilateral pleural effusions. Stable cardiomegaly. No pneumothorax.  IMPRESSION: Stable CHF.   Electronically Signed   By: Maryclare Bean M.D.   On: 05/22/2014 07:56   ASSESSMENT / PLAN:  PULMONARY OETT 11/10 >>> A: Acute on chronic hypoxic respiratory failure 2nd to aspiration with ARDS. Hx of COPD with continue tobacco abuse. P:   Pressure support wean as tolerated >> not ready for extubation yet Follow up CXR Scheduled BD's  CARDIOVASCULAR A:  A fib with RVR. Acute on chronic diastolic CHF. Hx of HTN, HLD, s/p MVR and AVR in 2003. Hypotension 11/17 >> likely from diuresis. P:  Hold lasix in setting of renal insufficiency Monitor hemodynamics Continue heparin gtt Continue lopressor, lipitor Plavix d/c'ed per cards recommendation  RENAL A:   AKI likely 2nd to diuresis >> baseline creatinine 1.41 from 05/25/2014. Hyponatremia - resolved. P:   Monitor renal fx, urine outpt Check FeNa Assess CVP Might need nephrology evaluation  GASTROINTESTINAL A:   GERD. Nutrition. P:   Pantoprazole for SUP TF per nutrition   HEMATOLOGIC A:   Anemia of critical illness, chronic disease, iron deficiency. Epistaxis >>  resolved. P:  F/u CBC Add feosol 11/18  INFECTIOUS A:   Aspiration PNA with ARDS. Increased respiratory secretions 11/17. P:   Day 10 of Abx, currently on rocephin  Sputum 11/17 >>   ENDOCRINE A:   No acute issues. P:   Monitor glucose on BMP  NEUROLOGIC A:   Acute metabolic encephalopathy 2nd to respiratory failure. Hx of ETOH abuse. P:   Sedation:  Propofol gtt / Fentanyl PRN RASS goal: -1 Daily WUA Thiamine / Folate / Multivitamin  Family updated: Updated 11/17.  Interdisciplinary Family Meeting v Palliative Care Meeting:  Completed 11/17.  SUMMARY: Tolerating some pressure support, but not ready for extubation trial yet.  Concern about worsening renal fx >> might need nephrology evaluation if no improvement.  CC time  35 minutes.  Chesley Mires, MD [facility] Pulmonary/Critical Care 05/23/2014, 7:08 AM Pager:  [phone] After 3pm call: [phone]

## 2014-05-23 NOTE — Progress Notes (Signed)
     SUBJECTIVE: Intubated. Sedated.   BP 107/58 mmHg  Pulse 66  Temp(Src) 99.9 F (37.7 C) (Core (Comment))  Resp 20  Ht 5\' 6"  (1.676 m)  Wt 163 lb 5.8 oz (74.1 kg)  BMI 26.38 kg/m2  SpO2 92%  Intake/Output Summary (Last 24 hours) at 05/23/14 0606 Last data filed at 05/23/14 0500  Gross per 24 hour  Intake 2686.4 ml  Output    819 ml  Net 1867.4 ml    PHYSICAL EXAM General: Intubated. Sedated Neck: + JVD.  Lungs: Mechanical BS bilaterally with no wheezes or rhonci noted.  Heart: irreg irreg with no murmurs noted. Abdomen: Bowel sounds are present. Soft, non-tender.  Extremities: No lower extremity edema.   LABS: Basic Metabolic Panel:  Recent Labs  05/21/14 0455 05/22/14 0550 05/23/14 0507  NA 141 145 145  K 4.0 4.1 4.2  CL 99 100 102  CO2 30 30 28   GLUCOSE 121* 115* 128*  BUN 81* 93* 100*  CREATININE 2.04* 2.57* 3.05*  CALCIUM 9.3 9.3 9.2  MG 2.2 2.1  --   PHOS 4.9* 6.0*  --    CBC:  Recent Labs  05/21/14 0455 05/22/14 0550 05/23/14 0507  WBC 9.3 8.1 8.3  NEUTROABS 7.4  --   --   HGB 7.3* 7.7* 7.5*  HCT 24.4* 26.2* 25.5*  MCV 95.3 94.6 95.1  PLT 204 216 232   Fasting Lipid Panel:  Recent Labs  05/22/14 0550  TRIG 149    Current Meds: . antiseptic oral rinse  7 mL Mouth Rinse QID  . atorvastatin  80 mg Per Tube Daily  . cefTRIAXone (ROCEPHIN)  IV  1 g Intravenous Q24H  . chlorhexidine  15 mL Mouth Rinse BID  . feeding supplement (VITAL HIGH PROTEIN)  1,000 mL Per Tube Q24H  . folic acid  1 mg Per Tube Daily  . ipratropium-albuterol  3 mL Nebulization Q6H  . metoprolol tartrate  25 mg Per Tube BID  . multivitamin  5 mL Per Tube Daily  . pantoprazole sodium  40 mg Per Tube Daily  . sodium chloride  3 mL Intravenous Q12H  . thiamine  100 mg Per Tube Daily     ASSESSMENT AND PLAN:  1. Mechanical AVR and MVR: Continue IV heparin. H/H overall stable. Stool is heme positive.    2. Atrial fibrillation: Rates 100-110 bpm.  Rate will be hard to control is setting of acute illness/respiratory failure. Continue metoprolol. Titrate metoprolol as needed for rate control as BP tolerates.   3. Acute diastolic heart failure: Volume overloaded but Lasix on hold with worsening renal function. Weight is still up 15 lbs over admit weight. Additional diuresis may benefit attempts at extubation as renal function tolerates. Echo with normal LVEF=55%.   4. Respiratory failure/ARDS: Per PCCM   5. Epistaxis: No further bleeding. Plavix would not need to be restarted. Last stent in July 2014.     MCALHANY,CHRISTOPHER  11/18/20156:06 AM

## 2014-05-23 NOTE — Progress Notes (Addendum)
ANTICOAGULATION CONSULT NOTE - Follow Up Consult  Pharmacy Consult for Heparin Indication: afib, mechanical MVR and AVR  No Known Allergies  Patient Measurements: Height: 5\' 6"  (167.6 cm) Weight: 163 lb 5.8 oz (74.1 kg) IBW/kg (Calculated) : 63.8 Heparin Dosing Weight: actual weight  Vital Signs: Temp: 100 F (37.8 C) (11/18 0600) Temp Source: Core (Comment) (11/18 0500) BP: 111/41 mmHg (11/18 0600) Pulse Rate: 88 (11/18 0600)  Labs:  Recent Labs  05/21/14 0455 05/22/14 0550 05/23/14 0507  HGB 7.3* 7.7* 7.5*  HCT 24.4* 26.2* 25.5*  PLT 204 216 232  HEPARINUNFRC 0.34 0.41 0.47  CREATININE 2.04* 2.57* 3.05*    Estimated Creatinine Clearance: 20.9 mL/min (by C-G formula based on Cr of 3.05).   Medications:  Infusions:  . heparin 1,200 Units/hr (05/23/14 0600)  . propofol 40 mcg/kg/min (05/23/14 0600)    Assessment: 61 yoM admitted on 11/18 with epistaxis, and required intubation on 11/10 for dyspnea.  He has chronic recurrent epistaxis with PMH of mechanical heart valves (MVR, AVR) on Plavix and warfarin 2.5mg  daily except 2.5mg  on Mon/Wed/Fri.  INR was 2.28 on admission.  Pharmacy was consulted to dose IV heparin on 11/12 for mechanical heart valves and afib.  Significant events 11/12 Heparin started 11/14 Heparin stopped d/t penile bleeding at noon. 11/15 Heparin resumed, no further bleeding noted.   Today, 05/23/2014  Heparin level 0.47, remains therapeutic  CBC: Hgb 7.5 is slightly decreased, Plt remain stable at 232  No further bleeding reported or documented in progress notes.  Rn reports a skin tear with bleeding on upper arm and small ooze at penile meatus yesterday.  SCr significantly increased to 3.05, CrCl ~ 20 ml/min   Goal of Therapy:  Heparin level 0.3-0.7 units/ml Monitor platelets by anticoagulation protocol: Yes   Plan:   Continue heparin IV infusion at 1200 units/hr  Daily heparin level and CBC  Continue to monitor H&H and  platelets   Gretta Arab PharmD, BCPS Pager [phone] 05/23/2014 7:24 AM

## 2014-05-24 ENCOUNTER — Inpatient Hospital Stay (HOSPITAL_COMMUNITY): Payer: Medicare Other

## 2014-05-24 DIAGNOSIS — I369 Nonrheumatic tricuspid valve disorder, unspecified: Secondary | ICD-10-CM

## 2014-05-24 LAB — CULTURE, RESPIRATORY W GRAM STAIN

## 2014-05-24 LAB — CBC
HEMATOCRIT: 25.9 % — AB (ref 39.0–52.0)
HEMOGLOBIN: 7.6 g/dL — AB (ref 13.0–17.0)
MCH: 27.6 pg (ref 26.0–34.0)
MCHC: 29.3 g/dL — AB (ref 30.0–36.0)
MCV: 94.2 fL (ref 78.0–100.0)
Platelets: 312 10*3/uL (ref 150–400)
RBC: 2.75 MIL/uL — ABNORMAL LOW (ref 4.22–5.81)
RDW: 19.8 % — ABNORMAL HIGH (ref 11.5–15.5)
WBC: 9.9 10*3/uL (ref 4.0–10.5)

## 2014-05-24 LAB — PROCALCITONIN: PROCALCITONIN: 0.48 ng/mL

## 2014-05-24 LAB — RENAL FUNCTION PANEL
Albumin: 2.5 g/dL — ABNORMAL LOW (ref 3.5–5.2)
Anion gap: 18 — ABNORMAL HIGH (ref 5–15)
BUN: 117 mg/dL — AB (ref 6–23)
CHLORIDE: 100 meq/L (ref 96–112)
CO2: 26 mEq/L (ref 19–32)
Calcium: 9.5 mg/dL (ref 8.4–10.5)
Creatinine, Ser: 3.81 mg/dL — ABNORMAL HIGH (ref 0.50–1.35)
GFR calc Af Amer: 17 mL/min — ABNORMAL LOW (ref >90)
GFR calc non Af Amer: 15 mL/min — ABNORMAL LOW (ref >90)
GLUCOSE: 133 mg/dL — AB (ref 70–99)
Phosphorus: 7 mg/dL — ABNORMAL HIGH (ref 2.3–4.6)
Potassium: 4.2 mEq/L (ref 3.7–5.3)
Sodium: 144 mEq/L (ref 137–147)

## 2014-05-24 LAB — HEPARIN LEVEL (UNFRACTIONATED): Heparin Unfractionated: 0.53 IU/mL (ref 0.30–0.70)

## 2014-05-24 LAB — URINE CULTURE
COLONY COUNT: NO GROWTH
CULTURE: NO GROWTH

## 2014-05-24 LAB — GLUCOSE, CAPILLARY
GLUCOSE-CAPILLARY: 118 mg/dL — AB (ref 70–99)
GLUCOSE-CAPILLARY: 112 mg/dL — AB (ref 70–99)
GLUCOSE-CAPILLARY: 135 mg/dL — AB (ref 70–99)
Glucose-Capillary: 108 mg/dL — ABNORMAL HIGH (ref 70–99)
Glucose-Capillary: 113 mg/dL — ABNORMAL HIGH (ref 70–99)
Glucose-Capillary: 121 mg/dL — ABNORMAL HIGH (ref 70–99)

## 2014-05-24 LAB — TRIGLYCERIDES: Triglycerides: 149 mg/dL (ref <150)

## 2014-05-24 LAB — CULTURE, RESPIRATORY

## 2014-05-24 MED ORDER — PANTOPRAZOLE SODIUM 40 MG PO PACK
40.0000 mg | PACK | Freq: Two times a day (BID) | ORAL | Status: DC
  Administered 2014-05-24 – 2014-05-26 (×4): 40 mg
  Filled 2014-05-24 (×5): qty 20

## 2014-05-24 NOTE — Progress Notes (Addendum)
[facility] Progress Note   Subjective: no better  Filed Vitals:   05/24/14 0400 05/24/14 0440 05/24/14 0500 05/24/14 0600  BP: 109/57 109/57 132/54 126/56  Pulse: 122 105 82 106  Temp: 98.6 F (37 C)  98.8 F (37.1 C) 98.8 F (37.1 C)  TempSrc:      Resp: 28 27 26 22   Height:      Weight:   74 kg (163 lb 2.3 oz)   SpO2: 88% 91% 90% 91%   Exam: On vent ,sedated No rash, cyanosis or gangrene Sclera anicteric, throat clear No jvd Chest mostly clear , some coarse wheezing w coughing  Irreg rhythm, no MRG Abd soft, NDNT, +BS, no ascites 2+ penile and scrotal edema 2+ dependent edema hips/ flanks 1+ edema lower legs bilat Neuro is sedated, not responsive  CVP 12 , 15, 19 Wt 67kg on 11/8, 74kg today I/O +10 liters since admit UOP 800 cc yest, 258 cc today CXR - CHF, stable Current meds - lipitor, Rocephin, feSO4, folic acid, duoneb, Lopressor, MVI, protonix UA (undersigned) > 1.015, 5.0, moderate gran casts, 4-8 non dysmorphic rbc's and 3-5 wbc's per HPF, +bacteria , no epis; dip no protein, blood, neg LE/ nit UNa 32, Ucreat 90  Assessment: 1 Acute on CKD 3 - suspect ATN +/- AIN. Will stop PPI.  Renal function continues to deteriorate. Vol overloaded and not responding to diuretics.  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 He is a poor candidate for renal replacement therapy, recommend palliative care consult and transition to focus on comfort.     Kelly Splinter MD  pager [phone]    cell [phone]  05/24/2014, 8:08 AM     Recent Labs Lab 05/21/14 0455 05/22/14 0550 05/23/14 0507 05/24/14 0550  NA 141 145 145 144  K 4.0 4.1 4.2 4.2  CL 99 100 102 100  CO2 30 30 28 26   GLUCOSE 121*  115* 128* 133*  BUN 81* 93* 100* 117*  CREATININE 2.04* 2.57* 3.05* 3.81*  CALCIUM 9.3 9.3 9.2 9.5  PHOS 4.9* 6.0*  --  7.0*    Recent Labs Lab 05/20/14 0400 05/24/14 0550  AST 75*  --   ALT 41  --   ALKPHOS 167*  --   BILITOT 0.6  --   PROT 7.1  --   ALBUMIN 2.5* 2.5*    Recent Labs Lab 05/20/14 0400 05/21/14 0455 05/22/14 0550 05/23/14 0507 05/24/14 0550  WBC 6.9 9.3 8.1 8.3 9.9  NEUTROABS 5.6 7.4  --   --   --   HGB 8.1* 7.3* 7.7* 7.5* 7.6*  HCT 26.9* 24.4* 26.2* 25.5* 25.9*  MCV 93.1 95.3 94.6 95.1 94.2  PLT 204 204 216 232 312   . antiseptic oral rinse  7 mL Mouth Rinse QID  . atorvastatin  80 mg Per Tube Daily  . cefTRIAXone (ROCEPHIN)  IV  1 g Intravenous Q24H  . chlorhexidine  15 mL Mouth Rinse BID  . feeding supplement (VITAL HIGH PROTEIN)  1,000 mL Per Tube Q24H  . ferrous sulfate  300 mg Oral Q12H  . folic acid  1 mg Per Tube Daily  . ipratropium-albuterol  3 mL Nebulization Q6H  . metoprolol tartrate  50 mg Per Tube BID  . multivitamin  5 mL Per Tube Daily  . pantoprazole sodium  40 mg Per Tube Daily  . sodium chloride  3 mL Intravenous Q12H  . thiamine  100 mg Per Tube Daily   . heparin 1,200 Units/hr (05/24/14 0600)  . propofol 40 mcg/kg/min (05/24/14 0600)   sodium chloride, albuterol, camphor-menthol, fentaNYL, metoprolol, [DISCONTINUED] ondansetron **OR** ondansetron (ZOFRAN) IV, sodium chloride, sodium chloride

## 2014-05-24 NOTE — Progress Notes (Signed)
ANTICOAGULATION CONSULT NOTE - Follow Up Consult  Pharmacy Consult for Heparin Indication: afib, mechanical MVR and AVR  No Known Allergies  Patient Measurements: Height: 5\' 6"  (167.6 cm) Weight: 163 lb 2.3 oz (74 kg) IBW/kg (Calculated) : 63.8 Heparin Dosing Weight: actual weight  Vital Signs: Temp: 98.8 F (37.1 C) (11/19 0600) Temp Source: Core (Comment) (11/18 2000) BP: 126/56 mmHg (11/19 0600) Pulse Rate: 106 (11/19 0600)  Labs:  Recent Labs  05/22/14 0550 05/23/14 0507 05/24/14 0550  HGB 7.7* 7.5* 7.6*  HCT 26.2* 25.5* 25.9*  PLT 216 232 312  HEPARINUNFRC 0.41 0.47 0.53  CREATININE 2.57* 3.05* 3.81*    Estimated Creatinine Clearance: 16.7 mL/min (by C-G formula based on Cr of 3.81).   Medications:  Infusions:  . heparin 1,200 Units/hr (05/24/14 0600)  . propofol 20 mcg/kg/min (05/24/14 0800)    Assessment: 27 yoM admitted on 11/18 with epistaxis, and required intubation on 11/10 for dyspnea.  He has chronic recurrent epistaxis with PMH of mechanical heart valves (MVR, AVR) on Plavix and warfarin 2.5mg  daily except 2.5mg  on Mon/Wed/Fri.  INR was 2.28 on admission.  Pharmacy was consulted to dose IV heparin on 11/12 for mechanical heart valves and afib.  Significant events 11/12 Heparin started 11/14 Heparin stopped d/t penile bleeding at noon. 11/15 Heparin resumed, no further bleeding noted.   Today, 05/24/2014  Heparin level 0.48, remains therapeutic  CBC: Hgb 7.6 is stable, Plt 312  No further bleeding reported or documented in progress notes.  Rn reports a skin tear with very minor bleeding on upper.  SCr significantly increased to 3.81, CrCl ~ 17 ml/min.  No plans for dialysis.   Goal of Therapy:  Heparin level 0.3-0.7 units/ml Monitor platelets by anticoagulation protocol: Yes   Plan:   Continue heparin IV infusion at 1200 units/hr  Daily heparin level and CBC  Continue to monitor H&H and platelets   Gretta Arab PharmD,  BCPS Pager [phone] 05/24/2014 7:11 AM

## 2014-05-24 NOTE — Progress Notes (Signed)
Met with family to discuss patients current clinical status and overall decline.  Extensive discussion regarding patients current state of health, prior state of health and his prior wishes from advanced directives.  Family (sister-in-law, niece, step-daughter and grand step daughter) indicate that he would not want to live supported in an artifical state.  He would not want tracheostomy, chronic vent or facility living, feeding tube.  In the past, the patient has even refused home health care.  They are all in agreement and understand that CPR would not be of benefit given current health state.  Informed them that he has been deemed not an HD candidate per Nephrology.    Plan: DNR Continue current support:  IMV, Feeding etc Re-evaluate renal function over the next 24-48 hours, if continued decline, they would want to progress toward comfort measures with extubation.  No Tracheostomy No feeding tube No facility placement  Would not initiate vasoactive support if he were to decline in the "evaluation" period.  If he developed hypotension, would transition more toward comfort care.  Family would need to be notified of clinical change.     Additional CC Time:  17 minutes   Noe Gens, NP-C [place] [facility] Pgr: [phone] or [phone]

## 2014-05-24 NOTE — Progress Notes (Signed)
     SUBJECTIVE: intubated, sedated.   BP 109/57 mmHg  Pulse 105  Temp(Src) 98.6 F (37 C) (Core (Comment))  Resp 27  Ht 5\' 6"  (1.676 m)  Wt 163 lb 2.3 oz (74 kg)  BMI 26.34 kg/m2  SpO2 91%  Intake/Output Summary (Last 24 hours) at 05/24/14 0601 Last data filed at 05/24/14 0400  Gross per 24 hour  Intake 1819.26 ml  Output   1026 ml  Net 793.26 ml    PHYSICAL EXAM General: Intubated. Sedated Neck: + JVD.  Lungs: Mechanical BS bilaterally with no wheezes or rhonci noted.  Heart: irreg irreg with no murmurs noted. Abdomen: Bowel sounds are present. Soft, non-tender.  Extremities: No lower extremity edema.   LABS: Basic Metabolic Panel:  Recent Labs  05/22/14 0550 05/23/14 0507  NA 145 145  K 4.1 4.2  CL 100 102  CO2 30 28  GLUCOSE 115* 128*  BUN 93* 100*  CREATININE 2.57* 3.05*  CALCIUM 9.3 9.2  MG 2.1  --   PHOS 6.0*  --    CBC:  Recent Labs  05/22/14 0550 05/23/14 0507  WBC 8.1 8.3  HGB 7.7* 7.5*  HCT 26.2* 25.5*  MCV 94.6 95.1  PLT 216 232   Current Meds: . antiseptic oral rinse  7 mL Mouth Rinse QID  . atorvastatin  80 mg Per Tube Daily  . cefTRIAXone (ROCEPHIN)  IV  1 g Intravenous Q24H  . chlorhexidine  15 mL Mouth Rinse BID  . feeding supplement (VITAL HIGH PROTEIN)  1,000 mL Per Tube Q24H  . ferrous sulfate  300 mg Oral Q12H  . folic acid  1 mg Per Tube Daily  . ipratropium-albuterol  3 mL Nebulization Q6H  . metoprolol tartrate  50 mg Per Tube BID  . multivitamin  5 mL Per Tube Daily  . pantoprazole sodium  40 mg Per Tube Daily  . sodium chloride  3 mL Intravenous Q12H  . thiamine  100 mg Per Tube Daily    ASSESSMENT AND PLAN:  1. Mechanical AVR and MVR: Continue IV heparin. No obvious bleeding.    2. Atrial fibrillation: Rates 100-110 bpm. Metoprolol increased to 50 mg po BID on 05/23/14. Continue metoprolol. Continue to titrate metoprolol as needed for rate control as BP tolerates.   3. Acute diastolic heart failure:  Volume overloaded. Lasix on hold with worsening renal function. One time dose of Lasix IV given yesterday by Nephrology. Repeat echo pending.    4. Respiratory failure/ARDS: Per PCCM   5. Epistaxis: No further bleeding. Plavix will not need to be restarted. Last stent in July 2014.    6. Acute renal failure: Nephrology following.   Poor prognosis  [name]  11/19/20156:01 AM

## 2014-05-24 NOTE — Progress Notes (Signed)
Echo Lab  2D Echocardiogram completed.  [place], RDCS 05/24/2014 11:06 AM

## 2014-05-24 NOTE — Plan of Care (Signed)
Problem: Phase I Progression Outcomes Goal: Sepsis Protocol initiated if indicated Outcome: Not Applicable Date Met:  49/44/96

## 2014-05-24 NOTE — Progress Notes (Signed)
Urine output has decreased and is now 25 cc over the course of 4 hrs, CVP 18 Dr Alva Garnet notified, no new orders received. Will continue to monitor.

## 2014-05-24 NOTE — Progress Notes (Signed)
PULMONARY / CRITICAL CARE MEDICINE   Name: [name] MRN: [id number] DOB: 1945/11/29    ADMISSION DATE:  05/07/2014 CONSULTATION DATE:  05/24/2014  REFERRING MD :  Rama  CHIEF COMPLAINT:  Respiratory failure in setting epistaxis  INITIAL PRESENTATION:  68 y.o. M brought to [facility] 11/8 with epistaxis.  Had rhinopack placed with temporary cessation of bleeding.  Seen by ENT 11/9 and had surgicel packing placed with apparent cessation of bleeding.  In early AM hours 11/10, developed worsening dyspnea despite NRB mask.  He was transferred to the ICU and intubated by CRNA for resp acidosis.     PMH - chronic 2L O2, frequent nosebleeds due to being on warfarin and plavix for hx of 2 mechanical heart valves.    STUDIES:    SIGNIFICANT EVENTS: 11/08  Admitted with epistaxis, required nasal packing 11/09  ENT consult 11/10  Transferred to ICU, intubated 11/14  Weaning, hematuria, hep held 11/16  No further hematuria or epistaxis, FiO2/PEEP increased to 50/8 11/17  Increased respiratory secretions 11/19  Sputum cultures neg, pt clinically worse, contact POA for comfort measures, not a candidate for CRRT per Nephro  SUBJECTIVE:   Unable to wean propofol with increased patient agitation.  Tolerating pressure support 10/5.  Kidney function continuing to decline, clinically worse.  VITAL SIGNS: Temp:  [98.4 F (36.9 C)-101.5 F (38.6 C)] 98.6 F (37 C) (11/19 0800) Pulse Rate:  [38-122] 95 (11/19 0800) Resp:  [14-35] 23 (11/19 0800) BP: (100-137)/(43-69) 115/54 mmHg (11/19 0800) SpO2:  [88 %-100 %] 92 % (11/19 0800) FiO2 (%):  [40 %] 40 % (11/19 0800) Weight:  [163 lb 2.3 oz (74 kg)] 163 lb 2.3 oz (74 kg) (11/19 0500)   HEMODYNAMICS: CVP:  [16 mmHg-19 mmHg] 17 mmHg   VENTILATOR SETTINGS: Vent Mode:  [-] PSV FiO2 (%):  [40 %] 40 % Set Rate:  [20 bmp] 20 bmp Vt Set:  [380 mL] 380 mL PEEP:  [5 cmH20] 5 cmH20 Pressure Support:  [10 cmH20-15 cmH20] 10 cmH20 Plateau Pressure:  [12  cmH20-17 cmH20] 12 cmH20   INTAKE / OUTPUT: Intake/Output      11/18 0701 - 11/19 0700 11/19 0701 - 11/20 0700   I.V. (mL/kg) 931.5 (12.6) 39.1 (0.5)   NG/GT 800    IV Piggyback 116    Total Intake(mL/kg) 1847.5 (25) 39.1 (0.5)   Urine (mL/kg/hr) 706 (0.4) 90 (0.7)   Stool 475 (0.3)    Total Output 1181 90   Net +666.5 -50.9         PHYSICAL EXAMINATION: General: no distress Neuro: RASS -1 HEENT: ETT in place, RIJ c, d, i Cardiovascular: irregular, 2/6 systolic click Lungs: scattered rhonchi, diminished at the bases bilaterally Abdomen: soft, non tender, + BS Musculoskeletal: warm, no edema Skin: multiple areas of ecchymoses, weeping skin tear on RUE  LABS:  CBC  Recent Labs Lab 05/22/14 0550 05/23/14 0507 05/24/14 0550  WBC 8.1 8.3 9.9  HGB 7.7* 7.5* 7.6*  HCT 26.2* 25.5* 25.9*  PLT 216 232 312   Coag's  Recent Labs Lab 05/18/14 0400  INR 1.74*   BMET  Recent Labs Lab 05/22/14 0550 05/23/14 0507 05/24/14 0550  NA 145 145 144  K 4.1 4.2 4.2  CL 100 102 100  CO2 30 28 26   BUN 93* 100* 117*  CREATININE 2.57* 3.05* 3.81*  GLUCOSE 115* 128* 133*   Electrolytes  Recent Labs Lab 05/21/14 0455 05/22/14 0550 05/23/14 0507 05/24/14 0550  CALCIUM 9.3 9.3  9.2 9.5  MG 2.2 2.1  --   --   PHOS 4.9* 6.0*  --  7.0*     Sepsis Markers  Recent Labs Lab 05/22/14 0550 05/23/14 0507 05/24/14 0550  PROCALCITON 0.41 0.40 0.48   ABG  Recent Labs Lab 05/18/14 1013 05/20/14 2132 05/22/14 0424  PHART 7.411 7.413 7.344*  PCO2ART 48.9* 43.4 53.5*  PO2ART 54.9* 52.4* 70.2*   Liver Enzymes  Recent Labs Lab 05/20/14 0400 05/24/14 0550  AST 75*  --   ALT 41  --   ALKPHOS 167*  --   BILITOT 0.6  --   ALBUMIN 2.5* 2.5*   Glucose  Recent Labs Lab 05/23/14 1205 05/23/14 1627 05/23/14 1922 05/24/14 0002 05/24/14 0356 05/24/14 0802  GLUCAP 152* 114* 123* 113* 118* 112*    Iron/TIBC/Ferritin/ %Sat    Component Value Date/Time   IRON  38* 05/22/2014 1130   TIBC 320 05/22/2014 1130   FERRITIN 224 05/22/2014 1130   IRONPCTSAT 12* 05/22/2014 1130    Imaging Dg Chest Port 1 View  05/24/2014   CLINICAL DATA:  Adult respiratory distress syndrome.  EXAM: PORTABLE CHEST - 1 VIEW  COMPARISON:  May 23, 2014.  FINDINGS: Stable cardiomegaly. 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 Endotracheal tube is in grossly good position with distal tip 3.8 cm above the carina. Nasogastric tube is seen entering the stomach. Stable right internal jugular catheter with distal tip in expected position of SVC. No pneumothorax is noted. Stable bilateral pulmonary edema is noted with minimal associated pleural effusions.  IMPRESSION: Stable support apparatus. Stable bilateral pulmonary edema with minimal associated pleural effusions.   Electronically Signed   By: Sabino Dick M.D.   On: 05/24/2014 07:16   Dg Chest Port 1 View  05/23/2014   CLINICAL DATA:  68 year old male with acute respiratory failure. Initial encounter.  EXAM: PORTABLE CHEST - 1 VIEW  COMPARISON:  05/22/2014 and earlier.  FINDINGS: Portable AP semi upright view at 0441 hrs. Stable endotracheal tube, visible enteric tube, and right IJ central line. Veiling bilateral lung base opacities in keeping with pleural effusions as well as continued confluent retrocardiac opacity due to a lower lobe collapse or consolidation. No pneumothorax. Stable pulmonary vascularity without overt edema. Stable cardiomegaly and mediastinal contours. Sequelae of CABG and cardiac valve replacement.  IMPRESSION: 1.  Stable lines and tubes. 2. Stable ventilation; Bilateral pleural effusions with lower lobe collapse or consolidation.   Electronically Signed   By: Lars Pinks M.D.   On: 05/23/2014 06:17   ASSESSMENT / PLAN:  PULMONARY OETT 11/10 >>> A: Acute on chronic hypoxic respiratory failure 2nd to aspiration with ARDS. Hx of COPD with continue tobacco abuse. P:   Tolerating  pressure support, but not wean of propofol, not able to extubate Serial CXR Scheduled BD's  CARDIOVASCULAR A:  A fib with RVR. Acute on chronic diastolic CHF  net positive and acute renal failure Hx of HTN, HLD, s/p MVR and AVR in 2003. Hypotension 11/17 >> likely from diuresis - resolved P:  Monitor hemodynamics Continue heparin gtt Continue lopressor, lipitor  RENAL A:   AKI likely 2nd to diuresis >> baseline creatinine 1.41 from 05/17/2014, continuing to increase 11/19 3.81 Hyponatremia - resolved. Metabolic acidosis - AG 18 (greater if corrected for hypoalbuminemia), etiology to include uremia from renal failure, infection from aspiration PNA/ARDS - pt intermittently febrile, no leukocytosis, neg pct P:   Monitor renal fx, urine outpt - net positive 8L for admission,  currently oliguric <0.4 mL/kg/hr for last 24 hrs FeNa 11/19 - 0.9%, suggestive of pre-renal and less likely ATN/glomerulonephritis Assess CVP ( MAP 68-84) Nephrology consult - pt is not a candidate for HD, suggests comfort measures  GASTROINTESTINAL A:   GERD. Nutrition. Positive fecal occult blood P:   Pantoprazole for SUP - consider increasing dose to BID TF per nutrition  Hb stable, monitor  HEMATOLOGIC A:   Anemia of critical illness, chronic disease, iron deficiency - stable Epistaxis >> resolved. P:  Monitor CBC Heparin gtt   INFECTIOUS A:   Aspiration PNA with ARDS. P:   Day 10 of Abx, currently on rocephin, end date 11/21 Sputum 11/17 >> 11/19; non-pathogenic flora (neg)  ENDOCRINE A:   No acute issues. P:   Monitor glucose on BMP  NEUROLOGIC A:   Acute metabolic encephalopathy 2nd to respiratory failure. Hx of ETOH abuse. P:   Sedation:  Propofol gtt / Fentanyl PRN RASS goal: -1 Daily WUA Thiamine / Folate / Multivitamin   Global:  Pt continuing to decline with renal failure, and unable to tolerate weaning off propofol, and no a candidate for HD, will contact family/POA  today to discuss comfort measures.  Delsa Grana PA-S2  Student note for educational purposes only - please see PCCM rounding note.

## 2014-05-24 NOTE — Progress Notes (Signed)
PULMONARY / CRITICAL CARE MEDICINE   Name: [name] MRN: [id number] DOB: 02-Dec-1945    ADMISSION DATE:  05/15/2014 CONSULTATION DATE:  05/24/2014  REFERRING MD :  Rama  CHIEF COMPLAINT:  Respiratory failure in setting epistaxis  INITIAL PRESENTATION:  68 y.o. M brought to [facility] 11/8 with epistaxis.  Had rhinopack placed with temporary cessation of bleeding.  Seen by ENT 11/9 and had surgicel packing placed with apparent cessation of bleeding.  In early AM hours 11/10, developed worsening dyspnea despite NRB mask.  He was transferred to the ICU and intubated by CRNA for resp acidosis.     PMH - chronic 2L O2, frequent nosebleeds due to being on warfarin and plavix for hx of 2 mechanical heart valves.    STUDIES:    SIGNIFICANT EVENTS: 11/08  Admitted with epistaxis, required nasal packing 11/09  ENT consult 11/10  Transferred to ICU, intubated 11/14  Weaning, hematuria, hep held 11/16  No further hematuria or epistaxis, FiO2/PEEP increased to 50/8 11/17  Increased respiratory secretions 11/19 Sputum cultures neg, pt clinically worse, not a candidate for CRRT per Nephro   SUBJECTIVE:  Not tolerating reduced dose propofol, tolerating PSV 10/5, renal function declinging  VITAL SIGNS: Temp:  [98.4 F (36.9 C)-101.5 F (38.6 C)] 99.1 F (37.3 C) (11/19 1000) Pulse Rate:  [38-128] 117 (11/19 1000) Resp:  [16-35] 33 (11/19 1000) BP: (78-137)/(43-68) 78/55 mmHg (11/19 1000) SpO2:  [88 %-100 %] 88 % (11/19 1000) FiO2 (%):  [40 %] 40 % (11/19 1000) Weight:  [163 lb 2.3 oz (74 kg)] 163 lb 2.3 oz (74 kg) (11/19 0500)   HEMODYNAMICS: CVP:  [16 mmHg-19 mmHg] 17 mmHg   VENTILATOR SETTINGS: Vent Mode:  [-] PSV FiO2 (%):  [40 %] 40 % Set Rate:  [20 bmp] 20 bmp Vt Set:  [380 mL] 380 mL PEEP:  [5 cmH20] 5 cmH20 Pressure Support:  [10 cmH20-15 cmH20] 10 cmH20 Plateau Pressure:  [12 cmH20-17 cmH20] 12 cmH20   INTAKE / OUTPUT: Intake/Output      11/18 0701 - 11/19 0700 11/19  0701 - 11/20 0700   I.V. (mL/kg) 931.5 (12.6) 100.3 (1.4)   NG/GT 1150 150   IV Piggyback 116    Total Intake(mL/kg) 2197.5 (29.7) 250.3 (3.4)   Urine (mL/kg/hr) 706 (0.4) 90 (0.4)   Stool 475 (0.3)    Total Output 1181 90   Net +1016.5 +160.3         PHYSICAL EXAMINATION: General: no distress Neuro: RASS -1 HEENT: ETT in place, R IJ TLC c/d/i Cardiovascular: irregular, 2/6 systolic click Lungs: scattered rhonchi Abdomen: soft, non tender Musculoskeletal: no acute deformities Skin: multiple areas of ecchymoses, weeping skin tear RUE  LABS:  CBC  Recent Labs Lab 05/22/14 0550 05/23/14 0507 05/24/14 0550  WBC 8.1 8.3 9.9  HGB 7.7* 7.5* 7.6*  HCT 26.2* 25.5* 25.9*  PLT 216 232 312   Coag's  Recent Labs Lab 05/18/14 0400  INR 1.74*   BMET  Recent Labs Lab 05/22/14 0550 05/23/14 0507 05/24/14 0550  NA 145 145 144  K 4.1 4.2 4.2  CL 100 102 100  CO2 30 28 26   BUN 93* 100* 117*  CREATININE 2.57* 3.05* 3.81*  GLUCOSE 115* 128* 133*   Electrolytes  Recent Labs Lab 05/21/14 0455 05/22/14 0550 05/23/14 0507 05/24/14 0550  CALCIUM 9.3 9.3 9.2 9.5  MG 2.2 2.1  --   --   PHOS 4.9* 6.0*  --  7.0*  Sepsis Markers  Recent Labs Lab 05/22/14 0550 05/23/14 0507 05/24/14 0550  PROCALCITON 0.41 0.40 0.48   ABG  Recent Labs Lab 05/18/14 1013 05/20/14 2132 05/22/14 0424  PHART 7.411 7.413 7.344*  PCO2ART 48.9* 43.4 53.5*  PO2ART 54.9* 52.4* 70.2*   Liver Enzymes  Recent Labs Lab 05/20/14 0400 05/24/14 0550  AST 75*  --   ALT 41  --   ALKPHOS 167*  --   BILITOT 0.6  --   ALBUMIN 2.5* 2.5*   Glucose  Recent Labs Lab 05/23/14 1205 05/23/14 1627 05/23/14 1922 05/24/14 0002 05/24/14 0356 05/24/14 0802  GLUCAP 152* 114* 123* 113* 118* 112*    Iron/TIBC/Ferritin/ %Sat    Component Value Date/Time   IRON 38* 05/22/2014 1130   TIBC 320 05/22/2014 1130   FERRITIN 224 05/22/2014 1130   IRONPCTSAT 12* 05/22/2014 1130     Imaging Dg Chest Port 1 View  05/24/2014   CLINICAL DATA:  Adult respiratory distress syndrome.  EXAM: PORTABLE CHEST - 1 VIEW  COMPARISON:  May 23, 2014.  FINDINGS: Stable cardiomegaly. 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 Endotracheal tube is in grossly good position with distal tip 3.8 cm above the carina. Nasogastric tube is seen entering the stomach. Stable right internal jugular catheter with distal tip in expected position of SVC. No pneumothorax is noted. Stable bilateral pulmonary edema is noted with minimal associated pleural effusions.  IMPRESSION: Stable support apparatus. Stable bilateral pulmonary edema with minimal associated pleural effusions.   Electronically Signed   By: Sabino Dick M.D.   On: 05/24/2014 07:16   Dg Chest Port 1 View  05/23/2014   CLINICAL DATA:  68 year old male with acute respiratory failure. Initial encounter.  EXAM: PORTABLE CHEST - 1 VIEW  COMPARISON:  05/22/2014 and earlier.  FINDINGS: Portable AP semi upright view at 0441 hrs. Stable endotracheal tube, visible enteric tube, and right IJ central line. Veiling bilateral lung base opacities in keeping with pleural effusions as well as continued confluent retrocardiac opacity due to a lower lobe collapse or consolidation. No pneumothorax. Stable pulmonary vascularity without overt edema. Stable cardiomegaly and mediastinal contours. Sequelae of CABG and cardiac valve replacement.  IMPRESSION: 1.  Stable lines and tubes. 2. Stable ventilation; Bilateral pleural effusions with lower lobe collapse or consolidation.   Electronically Signed   By: Lars Pinks M.D.   On: 05/23/2014 06:17   ASSESSMENT / PLAN:  PULMONARY OETT 11/10 >>> A: Acute on chronic hypoxic respiratory failure 2nd to aspiration with ARDS. Hx of COPD with continue tobacco abuse. P:   Pressure support wean as tolerated >> not ready for extubation yet Follow up CXR Scheduled BD's  CARDIOVASCULAR A:  A fib  with RVR. Acute on chronic diastolic CHF. Hx of HTN, HLD, s/p MVR and AVR in 2003. Hypotension 11/17 >> likely from diuresis. P:  No further lasix with renal insufficiency Monitor hemodynamics Continue heparin gtt Continue lopressor, lipitor Plavix d/c'ed per cards recommendation  RENAL A:   AKI likely 2nd to diuresis >> baseline creatinine 1.41 from 05/06/2014.  FeNa 11/19 - 0.9%, suggestive of pre-renal and less likely ATN/glomerulonephritis Hyponatremia - resolved. P:   Monitor renal fx, urine outpt Assess CVP Not a candidate for HD per Nephrology   GASTROINTESTINAL A:   GERD. Nutrition. P:   Pantoprazole for SUP, increase to BID TF per nutrition   HEMATOLOGIC A:   Anemia of critical illness, chronic disease, iron deficiency. Epistaxis >> resolved. P:  F/u CBC  Added feosol 11/18  INFECTIOUS A:   Aspiration PNA with ARDS. Increased respiratory secretions 11/17. P:   Day 11 of Abx, currently on rocephin, end date 11/20  Sputum 11/17 >> neg  ENDOCRINE A:   No acute issues. P:   Monitor glucose on BMP  NEUROLOGIC A:   Acute metabolic encephalopathy 2nd to respiratory failure. Hx of ETOH abuse. P:   Sedation:  Propofol gtt / Fentanyl PRN RASS goal: 0 Daily WUA Thiamine / Folate / Multivitamin  Family updated: Updated 11/17.  Interdisciplinary Family Meeting v Palliative Care Meeting:  Await family arrival 11/19 for [place] conversation. Confirmed with Veverly Fells time of meeting.    Noe Gens, NP-C [facility] Pgr: [phone] or [phone]  Reviewed above, and examined.  He has progressive renal failure, and not making progress with vent weaning.  He continues to develop agitation with attempts at sedation decrease.  Have set up meeting with family to discuss goals of care.  He is not a good candidate for renal replacement therapy.  Transition to comfort measures might be best option for him.  CC time by me independent of APP time  is 35 minutes.  Chesley Mires, MD [facility] 05/24/2014, 11:00 AM Pager:  [phone] After 3pm call: [phone]

## 2014-05-25 ENCOUNTER — Inpatient Hospital Stay (HOSPITAL_COMMUNITY): Payer: Medicare Other

## 2014-05-25 LAB — BASIC METABOLIC PANEL
Anion gap: 20 — ABNORMAL HIGH (ref 5–15)
BUN: 134 mg/dL — ABNORMAL HIGH (ref 6–23)
CALCIUM: 9.1 mg/dL (ref 8.4–10.5)
CHLORIDE: 103 meq/L (ref 96–112)
CO2: 26 mEq/L (ref 19–32)
CREATININE: 4.44 mg/dL — AB (ref 0.50–1.35)
GFR calc non Af Amer: 12 mL/min — ABNORMAL LOW (ref >90)
GFR, EST AFRICAN AMERICAN: 14 mL/min — AB (ref >90)
Glucose, Bld: 118 mg/dL — ABNORMAL HIGH (ref 70–99)
Potassium: 4.8 mEq/L (ref 3.7–5.3)
Sodium: 149 mEq/L — ABNORMAL HIGH (ref 137–147)

## 2014-05-25 LAB — GLUCOSE, CAPILLARY
GLUCOSE-CAPILLARY: 129 mg/dL — AB (ref 70–99)
GLUCOSE-CAPILLARY: 125 mg/dL — AB (ref 70–99)
Glucose-Capillary: 108 mg/dL — ABNORMAL HIGH (ref 70–99)
Glucose-Capillary: 109 mg/dL — ABNORMAL HIGH (ref 70–99)
Glucose-Capillary: 116 mg/dL — ABNORMAL HIGH (ref 70–99)
Glucose-Capillary: 133 mg/dL — ABNORMAL HIGH (ref 70–99)
Glucose-Capillary: 145 mg/dL — ABNORMAL HIGH (ref 70–99)

## 2014-05-25 LAB — CBC
HEMATOCRIT: 24.1 % — AB (ref 39.0–52.0)
Hemoglobin: 7.2 g/dL — ABNORMAL LOW (ref 13.0–17.0)
MCH: 28.2 pg (ref 26.0–34.0)
MCHC: 29.9 g/dL — ABNORMAL LOW (ref 30.0–36.0)
MCV: 94.5 fL (ref 78.0–100.0)
PLATELETS: 292 10*3/uL (ref 150–400)
RBC: 2.55 MIL/uL — ABNORMAL LOW (ref 4.22–5.81)
RDW: 19.7 % — AB (ref 11.5–15.5)
WBC: 10.7 10*3/uL — ABNORMAL HIGH (ref 4.0–10.5)

## 2014-05-25 LAB — HEPARIN LEVEL (UNFRACTIONATED): Heparin Unfractionated: 0.39 IU/mL (ref 0.30–0.70)

## 2014-05-25 MED ORDER — SODIUM CHLORIDE 0.9 % IV BOLUS (SEPSIS)
500.0000 mL | Freq: Once | INTRAVENOUS | Status: AC
  Administered 2014-05-25: 500 mL via INTRAVENOUS

## 2014-05-25 NOTE — Progress Notes (Signed)
     SUBJECTIVE: intubated, sedated  BP 82/43 mmHg  Pulse 118  Temp(Src) 98.1 F (36.7 C) (Core (Comment))  Resp 25  Ht 5\' 6"  (1.676 m)  Wt 163 lb 5.8 oz (74.1 kg)  BMI 26.38 kg/m2  SpO2 95%  Intake/Output Summary (Last 24 hours) at 05/25/14 0610 Last data filed at 05/25/14 0500  Gross per 24 hour  Intake 2260.58 ml  Output    658 ml  Net 1602.58 ml    PHYSICAL EXAM General: Intubated. Sedated Neck: + JVD.  Lungs: Mechanical BS bilaterally with no wheezes or rhonci noted.  Heart: irreg irreg with no murmurs noted. Abdomen: Bowel sounds are present. Soft, non-tender.  Extremities: No lower extremity edema.   LABS: Basic Metabolic Panel:  Recent Labs  05/24/14 0550 05/25/14 0351  NA 144 149*  K 4.2 4.8  CL 100 103  CO2 26 26  GLUCOSE 133* 118*  BUN 117* 134*  CREATININE 3.81* 4.44*  CALCIUM 9.5 9.1  PHOS 7.0*  --    CBC:  Recent Labs  05/24/14 0550 05/25/14 0351  WBC 9.9 10.7*  HGB 7.6* 7.2*  HCT 25.9* 24.1*  MCV 94.2 94.5  PLT 312 292   Fasting Lipid Panel:  Recent Labs  05/24/14 0550  TRIG 149    Current Meds: . antiseptic oral rinse  7 mL Mouth Rinse QID  . atorvastatin  80 mg Per Tube Daily  . cefTRIAXone (ROCEPHIN)  IV  1 g Intravenous Q24H  . chlorhexidine  15 mL Mouth Rinse BID  . feeding supplement (VITAL HIGH PROTEIN)  1,000 mL Per Tube Q24H  . ferrous sulfate  300 mg Oral Q12H  . folic acid  1 mg Per Tube Daily  . ipratropium-albuterol  3 mL Nebulization Q6H  . metoprolol tartrate  50 mg Per Tube BID  . multivitamin  5 mL Per Tube Daily  . pantoprazole sodium  40 mg Per Tube BID  . sodium chloride  3 mL Intravenous Q12H  . thiamine  100 mg Per Tube Daily    ASSESSMENT AND PLAN:  1. Mechanical AVR and MVR: Continue IV heparin.    2. Atrial fibrillation: Rates 100-120 bpm. Metoprolol increased to 50 mg po BID on 05/23/14. Continue metoprolol. Continue to titrate metoprolol as needed for rate control as BP  tolerates.   3. Acute diastolic heart failure: Volume overloaded but Lasix on hold with worsening renal function. Not a candidate for HD.   4. Respiratory failure/ARDS: Per PCCM. Unable to extubate. Pt now DNR. No escalation of care.   5. Epistaxis: No further bleeding. Plavix will not need to be restarted. Last stent in July 2014.   6. Acute renal failure: Nephrology following. Did not respond to IV Lasix on 05/23/14.   Plans to move to comfort care if no improvement today.    [name]  11/20/20156:10 AM

## 2014-05-25 NOTE — Progress Notes (Signed)
ANTICOAGULATION CONSULT NOTE - Follow Up Consult  Pharmacy Consult for Heparin Indication: afib, mechanical MVR and AVR  No Known Allergies  Patient Measurements: Height: 5\' 6"  (167.6 cm) Weight: 163 lb 5.8 oz (74.1 kg) IBW/kg (Calculated) : 63.8 Heparin Dosing Weight: actual weight  Vital Signs: Temp: 98.2 F (36.8 C) (11/20 0700) Temp Source: Core (Comment) (11/20 0300) BP: 106/56 mmHg (11/20 0700) Pulse Rate: 106 (11/20 0700)  Labs:  Recent Labs  05/23/14 0507 05/24/14 0550 05/25/14 0351  HGB 7.5* 7.6* 7.2*  HCT 25.5* 25.9* 24.1*  PLT 232 312 292  HEPARINUNFRC 0.47 0.53 0.39  CREATININE 3.05* 3.81* 4.44*    Estimated Creatinine Clearance: 14.4 mL/min (by C-G formula based on Cr of 4.44).   Medications:  Infusions:  . heparin 1,200 Units/hr (05/24/14 1429)  . propofol 30 mcg/kg/min (05/25/14 0406)    Assessment: 22 yoM admitted on 11/18 with epistaxis, and required intubation on 11/10 for dyspnea.  He has chronic recurrent epistaxis with PMH of mechanical heart valves (MVR, AVR) on Plavix and warfarin 2.5mg  daily except 2.5mg  on Mon/Wed/Fri.  INR was 2.28 on admission.  Pharmacy was consulted to dose IV heparin on 11/12 for mechanical heart valves and afib.  Significant events 11/12 Heparin started 11/14 Heparin stopped d/t penile bleeding at noon. 11/15 Heparin resumed, no further bleeding noted. 11/19 Clinically worsening. Now DNR, no escalation of care.   Today, 05/25/2014  Heparin level 0.39, remains therapeutic on 1200 units/hr  CBC: Hgb 7.2 slightly decreasing, Plt 292  No further bleeding reported or documented in progress notes.  Rn reports a skin tear with very minor bleeding on upper.  SCr significantly increased to 4.44, CrCl ~ 14 ml/min.  No plans for dialysis.   Goal of Therapy:  Heparin level 0.3-0.7 units/ml Monitor platelets by anticoagulation protocol: Yes   Plan:   Continue heparin IV infusion at 1200 units/hr  Daily heparin  level and CBC  Continue to monitor H&H and platelets   Peggyann Juba, PharmD, BCPS Pager: [phone]  05/25/2014 7:26 AM

## 2014-05-25 NOTE — Progress Notes (Signed)
PULMONARY / CRITICAL CARE MEDICINE   Name: [name] MRN: [id number] DOB: 05-28-1946    ADMISSION DATE:  05/14/2014 CONSULTATION DATE:  05/25/2014  REFERRING MD :  Rama  CHIEF COMPLAINT:  Respiratory failure in setting epistaxis  INITIAL PRESENTATION:  68 y.o. M brought to [facility] 11/8 with epistaxis.  Had rhinopack placed with temporary cessation of bleeding.  Seen by ENT 11/9 and had surgicel packing placed with apparent cessation of bleeding.  In early AM hours 11/10, developed worsening dyspnea despite NRB mask.  He was transferred to the ICU and intubated by CRNA for resp acidosis.     PMH - chronic 2L O2, frequent nosebleeds due to being on warfarin and plavix for hx of 2 mechanical heart valves.    STUDIES:   SIGNIFICANT EVENTS: 11/08  Admitted with epistaxis, required nasal packing 11/09  ENT consult 11/10  Transferred to ICU, intubated 11/14  Weaning, hematuria, hep held 11/16  No further hematuria or epistaxis, FiO2/PEEP increased to 50/8 11/17  Increased respiratory secretions 11/19 Sputum cultures neg, pt clinically worse, not a candidate for CRRT per Nephro   SUBJECTIVE:  Not tolerating reduced dose propofol, more agitated and hypotensive, requiring increase FiO2 40 to 50.  VITAL SIGNS: Temp:  [98.1 F (36.7 C)-99.3 F (37.4 C)] 98.2 F (36.8 C) (11/20 0800) Pulse Rate:  [46-187] 104 (11/20 0800) Resp:  [17-35] 27 (11/20 0800) BP: (78-132)/(43-72) 97/45 mmHg (11/20 0800) SpO2:  [88 %-99 %] 96 % (11/20 0800) FiO2 (%):  [40 %-50 %] 50 % (11/20 0854) Weight:  [163 lb 5.8 oz (74.1 kg)] 163 lb 5.8 oz (74.1 kg) (11/20 0409)   HEMODYNAMICS: CVP:  [8 mmHg-19 mmHg] 15 mmHg   VENTILATOR SETTINGS: Vent Mode:  [-] PRVC FiO2 (%):  [40 %-50 %] 50 % Set Rate:  [20 bmp] 20 bmp Vt Set:  [380 mL] 380 mL PEEP:  [5 cmH20] 5 cmH20 Pressure Support:  [10 cmH20] 10 cmH20 Plateau Pressure:  [14 cmH20-19 cmH20] 17 cmH20   INTAKE / OUTPUT: Intake/Output      11/19 0701 -  11/20 0700 11/20 0701 - 11/21 0700   I.V. (mL/kg) 755.1 (10.2) 34.8 (0.5)   NG/GT 1260 50   IV Piggyback 50    Total Intake(mL/kg) 2065.1 (27.9) 84.8 (1.1)   Urine (mL/kg/hr) 403 (0.2)    Stool 375 (0.2)    Total Output 778     Net +1287.1 +84.8         PHYSICAL EXAMINATION: General: Chronically ill appearing, elderly man, appears uncomfortable, slightly agitated Neuro: RASS -1 HEENT: ETT in place, R IJ TLC c/d/i,  Cardiovascular: irregular, 2/6 systolic click Lungs: scattered rhonchi, gagging and asynchronous with vent Abdomen: soft, non tender, +BS Musculoskeletal: no acute deformities Skin: multiple areas of ecchymoses, weeping skin tear RUE, 2+ pitting edema bilateral UE, no edema LE  LABS:  CBC  Recent Labs Lab 05/23/14 0507 05/24/14 0550 05/25/14 0351  WBC 8.3 9.9 10.7*  HGB 7.5* 7.6* 7.2*  HCT 25.5* 25.9* 24.1*  PLT 232 312 292   Coag's No results for input(s): APTT, INR in the last 168 hours. BMET  Recent Labs Lab 05/23/14 0507 05/24/14 0550 05/25/14 0351  NA 145 144 149*  K 4.2 4.2 4.8  CL 102 100 103  CO2 28 26 26   BUN 100* 117* 134*  CREATININE 3.05* 3.81* 4.44*  GLUCOSE 128* 133* 118*   Electrolytes  Recent Labs Lab 05/21/14 0455 05/22/14 0550 05/23/14 0507 05/24/14 0550 05/25/14 7628  CALCIUM 9.3 9.3 9.2 9.5 9.1  MG 2.2 2.1  --   --   --   PHOS 4.9* 6.0*  --  7.0*  --      Sepsis Markers  Recent Labs Lab 05/22/14 0550 05/23/14 0507 05/24/14 0550  PROCALCITON 0.41 0.40 0.48   ABG  Recent Labs Lab 05/18/14 1013 05/20/14 2132 05/22/14 0424  PHART 7.411 7.413 7.344*  PCO2ART 48.9* 43.4 53.5*  PO2ART 54.9* 52.4* 70.2*   Liver Enzymes  Recent Labs Lab 05/20/14 0400 05/24/14 0550  AST 75*  --   ALT 41  --   ALKPHOS 167*  --   BILITOT 0.6  --   ALBUMIN 2.5* 2.5*   Glucose  Recent Labs Lab 05/24/14 1201 05/24/14 1553 05/24/14 1954 05/24/14 2353 05/25/14 0436 05/25/14 0730  GLUCAP 135* 108* 121* 133* 129*  109*    Iron/TIBC/Ferritin/ %Sat    Component Value Date/Time   IRON 38* 05/22/2014 1130   TIBC 320 05/22/2014 1130   FERRITIN 224 05/22/2014 1130   IRONPCTSAT 12* 05/22/2014 1130    Imaging Dg Chest Port 1 View  05/25/2014   CLINICAL DATA:  Acute respiratory failure  EXAM: PORTABLE CHEST - 1 VIEW  COMPARISON:  Portable chest x-ray of May 24, 2014  FINDINGS: The lungs are adequately inflated. The interstitial markings remain increased diffusely and have become slightly more confluent. The hemidiaphragms remain obscured consistent with pleural effusions. There is no pneumothorax. The cardiopericardial silhouette remains enlarged. Aortic and mitral valve rings are present. There are 7 intact sternal wires. The pulmonary vascularity is engorged and indistinct.  The endotracheal tube tip lies approximately 3.6 cm above the crotch of the carina. The esophagogastric tube tip projects below the inferior margin of the image. The right internal jugular venous catheter tip projects over the midportion of the SVC.  IMPRESSION: Pulmonary interstitial and alveolar edema secondary to CHF with small bilateral pleural effusions. Overall the appearance of the pulmonary interstitium has deteriorated slightly since yesterday's study. The support tubes are in appropriate position.   Electronically Signed   By: David  Martinique   On: 05/25/2014 07:41   Dg Chest Port 1 View  05/24/2014   CLINICAL DATA:  Adult respiratory distress syndrome.  EXAM: PORTABLE CHEST - 1 VIEW  COMPARISON:  May 23, 2014.  FINDINGS: Stable cardiomegaly. 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 Endotracheal tube is in grossly good position with distal tip 3.8 cm above the carina. Nasogastric tube is seen entering the stomach. Stable right internal jugular catheter with distal tip in expected position of SVC. No pneumothorax is noted. Stable bilateral pulmonary edema is noted with minimal associated pleural  effusions.  IMPRESSION: Stable support apparatus. Stable bilateral pulmonary edema with minimal associated pleural effusions.   Electronically Signed   By: Sabino Dick M.D.   On: 05/24/2014 07:16   ASSESSMENT / PLAN:  PULMONARY OETT 11/10 >>> A: Acute on chronic hypoxic respiratory failure 2nd to aspiration with ARDS. Hx of COPD with continue tobacco abuse. P:   Not tolerating SBT with pressure support, requiring increased FiO2 - 50% Follow up CXR Scheduled BD's  CARDIOVASCULAR A:  A fib with RVR Acute on chronic diastolic CHF. Hx of HTN, HLD, s/p MVR and AVR in 2003. P:  No further lasix with renal insufficiency Monitor hemodynamics Continue heparin gtt Continue lopressor, lipitor  Plavix d/c'ed per cards recommendation  RENAL A:   AKI likely 2nd to diuresis >> baseline creatinine 1.41 from 05/06/2014,  progressive decline in renal function, oliguria Hyponatremia - resolved. Hypernatremia AG Metabolic Acidosis - likely from hyperuricemia r/t renal failure P:   Monitor renal fx, urine outpt (net positive 10L since admission) Assess CVP Not a candidate for HD per Nephrology   GASTROINTESTINAL A:   GERD. Nutrition. P:   Pantoprazole for SUP, increase to BID TF per nutrition   HEMATOLOGIC A:   Anemia of critical illness, chronic disease, iron deficiency- frequent phlebotomy may be a contributor Epistaxis >> resolved. P:  F/u CBC Added feosol 11/18 Continue Heparin gtt  INFECTIOUS A:   Aspiration PNA with ARDS. Increased respiratory secretions 11/17 - unchanged (moderate tan-pink sputum) Leukocytosis - WBC increased 11/20 P:   Rocephin 11/14  >> 11/20 Sputum 11/17 >> neg May not pursue work up of increased WBC in the setting of decreasing renal function, increased agitation and advancing comfort measures  ENDOCRINE A:   No acute issues. P:   Monitor glucose on BMP  NEUROLOGIC A:   Acute metabolic encephalopathy 2nd to respiratory failure, continued in  setting of renal failure Hx of ETOH abuse. P:   Sedation:  Propofol gtt / Fentanyl PRN RASS goal: -1 Daily WUA Thiamine / Folate / Multivitamin Increase comfort measures, tolerate MAP ~ 50   Family updated:  Updated 11/19  Today's Summary:  Mr. Winther, despite a good day yesterday with minimal agitation and successful weaning to pressure support on vent, today looks clinically much worse, more agitated, and unable to wean sedation and vent.  Respiratory will continue to attempt weaning throughout the day.  Renal function is continuing to decline.  Family meeting yesterday (11/19) decided to make patient DNR and began discussing transitioning to comfort measures depending on his clinical picture and renal function.  Goals for today may need to be geared towards making Mr. Vent less agitated and more comfortable.  PCCM team will contact family with update.  Delsa Grana PA-S2  Student note for educational purposes only - please see PCCM rounding note.

## 2014-05-25 NOTE — Progress Notes (Signed)
PULMONARY / CRITICAL CARE MEDICINE   Name: [name] MRN: [id number] DOB: [birth year]    ADMISSION DATE:  05/23/2014 CONSULTATION DATE:  05/25/2014  REFERRING MD :  Rama  CHIEF COMPLAINT:  Respiratory failure in setting epistaxis  INITIAL PRESENTATION:  68 y.o. M brought to [facility] 11/8 with epistaxis.  Had rhinopack placed with temporary cessation of bleeding.  Seen by ENT 11/9 and had surgicel packing placed with apparent cessation of bleeding.  In early AM hours 11/10, developed worsening dyspnea despite NRB mask.  He was transferred to the ICU and intubated by CRNA for resp acidosis.     PMH - chronic 2L O2, frequent nosebleeds due to being on warfarin and plavix for hx of 2 mechanical heart valves.    STUDIES:    SIGNIFICANT EVENTS: 11/08  Admitted with epistaxis, required nasal packing 11/09  ENT consult 11/10  Transferred to ICU, intubated 11/14  Weaning, hematuria, hep held 11/16  No further hematuria or epistaxis, FiO2/PEEP increased to 50/8 11/17  Increased respiratory secretions 11/19 Sputum cultures neg, pt clinically worse, not a candidate for CRRT per Nephro   SUBJECTIVE:  RN reports pt more agitated, appears uncomfortable.   VITAL SIGNS: Temp:  [98.1 F (36.7 C)-99.3 F (37.4 C)] 98.2 F (36.8 C) (11/20 0800) Pulse Rate:  [46-187] 104 (11/20 0800) Resp:  [17-35] 27 (11/20 0800) BP: (81-132)/(43-72) 97/45 mmHg (11/20 0800) SpO2:  [89 %-99 %] 96 % (11/20 0800) FiO2 (%):  [40 %-50 %] 50 % (11/20 0854) Weight:  [163 lb 5.8 oz (74.1 kg)] 163 lb 5.8 oz (74.1 kg) (11/20 0409)   HEMODYNAMICS: CVP:  [8 mmHg-19 mmHg] 15 mmHg   VENTILATOR SETTINGS: Vent Mode:  [-] PRVC FiO2 (%):  [40 %-50 %] 50 % Set Rate:  [20 bmp] 20 bmp Vt Set:  [380 mL] 380 mL PEEP:  [5 cmH20] 5 cmH20 Pressure Support:  [10 cmH20] 10 cmH20 Plateau Pressure:  [14 cmH20-19 cmH20] 17 cmH20   INTAKE / OUTPUT: Intake/Output      11/19 0701 - 11/20 0700 11/20 0701 - 11/21 0700   I.V.  (mL/kg) 755.1 (10.2) 34.8 (0.5)   NG/GT 1260 50   IV Piggyback 50    Total Intake(mL/kg) 2065.1 (27.9) 84.8 (1.1)   Urine (mL/kg/hr) 403 (0.2)    Stool 375 (0.2)    Total Output 778     Net +1287.1 +84.8         PHYSICAL EXAMINATION: General: chronically ill male in NAD, on vent Neuro: RASS 0-+1, intermittent agitation HEENT: ETT in place, R IJ TLC c/d/i Cardiovascular: irregular, 2/6 systolic click Lungs: scattered rhonchi Abdomen: soft, non tender Musculoskeletal: no acute deformities Skin: multiple areas of ecchymoses, weeping skin tear RUE  LABS:  CBC  Recent Labs Lab 05/23/14 0507 05/24/14 0550 05/25/14 0351  WBC 8.3 9.9 10.7*  HGB 7.5* 7.6* 7.2*  HCT 25.5* 25.9* 24.1*  PLT 232 312 292   Coag's No results for input(s): APTT, INR in the last 168 hours.   BMET  Recent Labs Lab 05/23/14 0507 05/24/14 0550 05/25/14 0351  NA 145 144 149*  K 4.2 4.2 4.8  CL 102 100 103  CO2 _0 BUN 100* 117* 134*  CREATININE 3.05* 3.81* 4.44*  GLUCOSE 128* 133* 118*   Electrolytes  Recent Labs Lab 05/21/14 0455 05/22/14 0550 05/23/14 0507 05/24/14 0550 05/25/14 0351  CALCIUM 9.3 9.3 9.2 9.5 9.1  MG 2.2 2.1  --   --   --  PHOS 4.9* 6.0*  --  7.0*  --     Sepsis Markers  Recent Labs Lab 05/22/14 0550 05/23/14 0507 05/24/14 0550  PROCALCITON 0.41 0.40 0.48   ABG  Recent Labs Lab 05/20/14 2132 05/22/14 0424  PHART 7.413 7.344*  PCO2ART 43.4 53.5*  PO2ART 52.4* 70.2*   Liver Enzymes  Recent Labs Lab 05/20/14 0400 05/24/14 0550  AST 75*  --   ALT 41  --   ALKPHOS 167*  --   BILITOT 0.6  --   ALBUMIN 2.5* 2.5*   Glucose  Recent Labs Lab 05/24/14 1201 05/24/14 1553 05/24/14 1954 05/24/14 2353 05/25/14 0436 05/25/14 0730  GLUCAP 135* 108* 121* 133* 129* 109*    Iron/TIBC/Ferritin/ %Sat    Component Value Date/Time   IRON 38* 05/22/2014 1130   TIBC 320 05/22/2014 1130   FERRITIN 224 05/22/2014 1130   IRONPCTSAT 12*  05/22/2014 1130    Imaging Dg Chest Port 1 View  05/25/2014   CLINICAL DATA:  Acute respiratory failure  EXAM: PORTABLE CHEST - 1 VIEW  COMPARISON:  Portable chest x-ray of May 24, 2014  FINDINGS: The lungs are adequately inflated. The interstitial markings remain increased diffusely and have become slightly more confluent. The hemidiaphragms remain obscured consistent with pleural effusions. There is no pneumothorax. The cardiopericardial silhouette remains enlarged. Aortic and mitral valve rings are present. There are 7 intact sternal wires. The pulmonary vascularity is engorged and indistinct.  The endotracheal tube tip lies approximately 3.6 cm above the crotch of the carina. The esophagogastric tube tip projects below the inferior margin of the image. The right internal jugular venous catheter tip projects over the midportion of the SVC.  IMPRESSION: Pulmonary interstitial and alveolar edema secondary to CHF with small bilateral pleural effusions. Overall the appearance of the pulmonary interstitium has deteriorated slightly since yesterday's study. The support tubes are in appropriate position.   Electronically Signed   By: David  Martinique   On: 05/25/2014 07:41   Dg Chest Port 1 View  05/24/2014   CLINICAL DATA:  Adult respiratory distress syndrome.  EXAM: PORTABLE CHEST - 1 VIEW  COMPARISON:  May 23, 2014.  FINDINGS: Stable cardiomegaly. 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 Endotracheal tube is in grossly good position with distal tip 3.8 cm above the carina. Nasogastric tube is seen entering the stomach. Stable right internal jugular catheter with distal tip in expected position of SVC. No pneumothorax is noted. Stable bilateral pulmonary edema is noted with minimal associated pleural effusions.  IMPRESSION: Stable support apparatus. Stable bilateral pulmonary edema with minimal associated pleural effusions.   Electronically Signed   By: Sabino Dick M.D.    On: 05/24/2014 07:16   ASSESSMENT / PLAN:  PULMONARY OETT 11/10 >>> A: Acute on chronic hypoxic respiratory failure 2nd to aspiration with ARDS. Hx of COPD with continue tobacco abuse. P:   Pressure support wean as tolerated, likely this will be extubation to comfort based on conversation per family  Follow up CXR Scheduled BD's  CARDIOVASCULAR A:  A fib with RVR. Acute on chronic diastolic CHF. Hx of HTN, HLD, s/p MVR and AVR in 2003. Hypotension 11/17 >> likely from diuresis. P:  No further lasix with renal insufficiency Monitor hemodynamics Continue heparin gtt Continue lopressor, lipitor Plavix d/c'ed per cards recommendation DNR  RENAL A:   AKI likely 2nd to diuresis >> baseline creatinine 1.41 from 05/11/2014.   Hyponatremia - resolved. P:   Monitor renal fx, urine outpt  Assess CVP Not a candidate for HD per Nephrology  Plan on 11/19 was to monitor his status over the next 24-48 hours, if renal function continued to decline.  Family would want comfort measures.    GASTROINTESTINAL A:   GERD. Nutrition. P:   Pantoprazole for SUP, increase to BID TF per nutrition   HEMATOLOGIC A:   Anemia of critical illness, chronic disease, iron deficiency. Epistaxis >> resolved. P:  F/u CBC Added feosol 11/18  INFECTIOUS A:   Aspiration PNA with ARDS. Increased respiratory secretions 11/17. P:   Day 12 of Abx, currently on rocephin, end date 11/20  Sputum 11/17 >> neg  ENDOCRINE A:   No acute issues. P:   Monitor glucose on BMP  NEUROLOGIC A:   Acute metabolic encephalopathy 2nd to respiratory failure. Hx of ETOH abuse. P:   Sedation:  Propofol gtt / Fentanyl PRN RASS goal: 0 Daily WUA Thiamine / Folate / Multivitamin  Family updated: Updated 11/17.  Interdisciplinary Family Meeting v Palliative Care Meeting:  Met 11/19, see note.  He has clinically worsened overnight.  Will discuss with family once they arrive.  They may want to progress toward  comfort care late this evening vs tomorrow.    Noe Gens, NP-C [facility] Pgr: [phone] or [phone]  Reviewed above, examined.  Pt requiring more sedation.  Urine outpt worse and renal fx worse.  More edema.  Not tolerating pressure support.  Now DNR with no escalation of care >> likely transition to comfort measures over the weekend.  Updated POA at bedside.  Chesley Mires, MD [facility] Pulmonary/Critical Care 05/25/2014, 12:02 PM Pager:  [phone] After 3pm call: [phone]

## 2014-05-25 NOTE — Progress Notes (Signed)
500cc bolus given per Noe Gens, NP

## 2014-05-26 DIAGNOSIS — Z9889 Other specified postprocedural states: Secondary | ICD-10-CM

## 2014-05-26 DIAGNOSIS — Z515 Encounter for palliative care: Secondary | ICD-10-CM

## 2014-05-26 DIAGNOSIS — Z66 Do not resuscitate: Secondary | ICD-10-CM

## 2014-05-26 LAB — CBC
HCT: 23.4 % — ABNORMAL LOW (ref 39.0–52.0)
HEMOGLOBIN: 7 g/dL — AB (ref 13.0–17.0)
MCH: 28.3 pg (ref 26.0–34.0)
MCHC: 29.9 g/dL — ABNORMAL LOW (ref 30.0–36.0)
MCV: 94.7 fL (ref 78.0–100.0)
Platelets: 346 10*3/uL (ref 150–400)
RBC: 2.47 MIL/uL — AB (ref 4.22–5.81)
RDW: 19.7 % — ABNORMAL HIGH (ref 11.5–15.5)
WBC: 11.4 10*3/uL — ABNORMAL HIGH (ref 4.0–10.5)

## 2014-05-26 LAB — GLUCOSE, CAPILLARY
GLUCOSE-CAPILLARY: 125 mg/dL — AB (ref 70–99)
Glucose-Capillary: 121 mg/dL — ABNORMAL HIGH (ref 70–99)

## 2014-05-26 LAB — HEPARIN LEVEL (UNFRACTIONATED): Heparin Unfractionated: 0.39 IU/mL (ref 0.30–0.70)

## 2014-05-26 MED ORDER — ATROPINE SULFATE 1 % OP SOLN
4.0000 [drp] | OPHTHALMIC | Status: DC | PRN
  Administered 2014-05-26: 4 [drp] via SUBLINGUAL
  Filled 2014-05-26: qty 2

## 2014-05-26 MED ORDER — LORAZEPAM BOLUS VIA INFUSION
2.0000 mg | INTRAVENOUS | Status: DC | PRN
  Filled 2014-05-26: qty 5

## 2014-05-26 MED ORDER — LORAZEPAM 2 MG/ML IJ SOLN
1.0000 mg/h | INTRAVENOUS | Status: DC
  Administered 2014-05-26: 4 mg/h via INTRAVENOUS
  Filled 2014-05-26: qty 25

## 2014-05-26 MED ORDER — MORPHINE SULFATE 10 MG/ML IJ SOLN
1.0000 mg/h | INTRAVENOUS | Status: DC
  Administered 2014-05-26: 5 mg/h via INTRAVENOUS
  Filled 2014-05-26: qty 10

## 2014-05-26 MED ORDER — MORPHINE BOLUS VIA INFUSION
5.0000 mg | INTRAVENOUS | Status: DC | PRN
  Filled 2014-05-26: qty 20

## 2014-05-29 NOTE — Discharge Summary (Signed)
PULMONARY / CRITICAL CARE MEDICINE  [name] was a 68 y.o. male admitted on 05/25/2014 with with epistaxis.  Had rhinopack placed with temporary cessation of bleeding.  Seen by ENT 11/9 and had surgicel packing placed with apparent cessation of bleeding.  In early AM hours 11/10, developed worsening dyspnea despite NRB mask.  He was transferred to the ICU and intubated by CRNA for resp acidosis.  Hx has PMH - chronic 2L O2, frequent nosebleeds due to being on warfarin and plavix for hx of 2 mechanical heart valves.  He had nasal packing by ENT.  He developed aspiration and transferred to ICU.  He was intubated.  He developed ARDS.  He was tx with Abx.  He had cardiology consultation for A fib with RVR.  He was not able to tolerate vent weaning.  He developed progressive renal failure.  Nephrology was consulted >> determined that he was not appropriate candidate for renal replacement therapy due to poor prognosis.  Conference was held with pt's POA (his sister in [employer]) and other family members.  Decision was made for DNR status and no escalation of care.  He developed progressive edema and hypotension.  Family decided to transition to comfort measures.  He was removed from ventilator support, and subsequently expired on 05-27-14.     Final Diagnoses:  Acute on chronic hypoxic respiratory failure 2nd to aspiration with ARDS. Hx of COPD with continue tobacco abuse. A fib with RVR. Acute on chronic diastolic CHF. Hx of HTN, HLD, s/p MVR and AVR in 2003. Hypotension 11/17 >> likely from diuresis. AKI likely 2nd to diuresis >> baseline creatinine 1.41 from 05/12/2014.   Hyponatremia - resolved. GERD. Protein calorie malnutrition. Anemia of critical illness, chronic disease, iron deficiency. Epistaxis >> resolved. Aspiration PNA with ARDS. Increased respiratory secretions 11/17. Acute metabolic encephalopathy 2nd to respiratory failure. Hx of ETOH abuse.   Chesley Mires, MD [facility]  Pulmonary/Critical Care 05/29/2014, 2:04 PM

## 2014-06-05 NOTE — Progress Notes (Signed)
ANTICOAGULATION CONSULT NOTE - Follow Up Consult  Pharmacy Consult for Heparin Indication: afib, mechanical MVR and AVR  No Known Allergies  Patient Measurements: Height: 5\' 6"  (167.6 cm) Weight: 166 lb 3.6 oz (75.4 kg) IBW/kg (Calculated) : 63.8 Heparin Dosing Weight: actual weight  Vital Signs: Temp: 98.1 F (36.7 C) (11/21 0700) Temp Source: Core (Comment) (11/21 0500) BP: 97/54 mmHg (11/21 0700) Pulse Rate: 110 (11/21 0700)  Labs:  Recent Labs  05/24/14 0550 05/25/14 0351 Jun 15, 2014 0315  HGB 7.6* 7.2* 7.0*  HCT 25.9* 24.1* 23.4*  PLT 312 292 346  HEPARINUNFRC 0.53 0.39 0.39  CREATININE 3.81* 4.44*  --     Estimated Creatinine Clearance: 14.4 mL/min (by C-G formula based on Cr of 4.44).   Medications:  Infusions:  . heparin 1,200 Units/hr (05/25/14 1200)  . propofol 35 mcg/kg/min (06/15/14 0341)    Assessment: 53 yoM admitted on 11/18 with epistaxis, and required intubation on 11/10 for dyspnea.  He has chronic recurrent epistaxis with PMH of mechanical heart valves (MVR, AVR) on Plavix and warfarin 2.5mg  daily except 2.5mg  on Mon/Wed/Fri.  INR was 2.28 on admission.  Pharmacy was consulted to dose IV heparin on 11/12 for mechanical heart valves and afib.  Significant events 11/12 Heparin started 11/14 Heparin stopped d/t penile bleeding at noon. 11/15 Heparin resumed, no further bleeding noted. 11/18 Iron replacement started 11/19 Clinically worsening. Now DNR, no escalation of care. 11/20 Heparin level remains therapeutic. Considering comfort care over next 24-48hr   Today, 2014/06/15  Heparin level 0.39, remains therapeutic on 1200 units/hr  CBC: Hgb 7.0 slightly decreasing, Plt WNL  No further bleeding reported or documented in progress notes.    SCr significantly increased to 4.44, CrCl ~ 14 ml/min.  No plans for dialysis.   Goal of Therapy:  Heparin level 0.3-0.7 units/ml Monitor platelets by anticoagulation protocol: Yes   Plan:    Continue heparin IV infusion at 1200 units/hr  Daily heparin level and CBC  Continue to monitor H&H and platelets   Peggyann Juba, PharmD, BCPS Pager: [phone]  06-15-14 8:05 AM

## 2014-06-05 NOTE — Significant Event (Signed)
Family has arrived, and informed staff they are ready to proceed with vent withdrawal and transition to comfort measures only.  Orders placed.  Chesley Mires, MD [facility] Pulmonary/Critical Care June 25, 2014, 11:06 AM Pager:  [phone] After 3pm call: [phone]

## 2014-06-05 NOTE — Progress Notes (Signed)
PULMONARY / CRITICAL CARE MEDICINE  [name] is a 68 y.o. male admitted on 05/20/2014 with with epistaxis.  Had rhinopack placed with temporary cessation of bleeding.  Seen by ENT 11/9 and had surgicel packing placed with apparent cessation of bleeding.  In early AM hours 11/10, developed worsening dyspnea despite NRB mask.  He was transferred to the ICU and intubated by CRNA for resp acidosis.  Hx has PMH - chronic 2L O2, frequent nosebleeds due to being on warfarin and plavix for hx of 2 mechanical heart valves.  He had nasal packing by ENT.  He developed aspiration and transferred to ICU.  He was intubated.  He developed ARDS.  He was tx with Abx.  He had cardiology consultation for A fib with RVR.  He was not able to tolerate vent weaning.  He developed progressive renal failure.  Nephrology was consulted >> determined that he was not appropriate candidate for renal replacement therapy due to poor prognosis.  Conference was held with pt's POA (his sister in Sports coach) and other family members.  Decision was made for DNR status and no escalation of care.  He developed progressive edema and hypotension.    SUBJECTIVE:  Blood pressure down.  Not as alert.  Paradoxical breathing pattern.  Minimal urine outpt.  VITAL SIGNS: Temp:  [97.9 F (36.6 C)-99 F (37.2 C)] 98.4 F (36.9 C) (11/21 0800) Pulse Rate:  [27-123] 27 (11/21 0800) Resp:  [23-33] 28 (11/21 0800) BP: (71-107)/(37-67) 75/41 mmHg (11/21 0800) SpO2:  [88 %-98 %] 97 % (11/21 0800) FiO2 (%):  [50 %-60 %] 60 % (11/21 0800) Weight:  [166 lb 3.6 oz (75.4 kg)] 166 lb 3.6 oz (75.4 kg) (11/21 0352)   VENTILATOR SETTINGS: Vent Mode:  [-] PRVC FiO2 (%):  [50 %-60 %] 60 % Set Rate:  [20 bmp] 20 bmp Vt Set:  [380 mL] 380 mL PEEP:  [5 cmH20] 5 cmH20 Plateau Pressure:  [13 cmH20-17 cmH20] 13 cmH20   INTAKE / OUTPUT: Intake/Output      11/20 0701 - 11/21 0700 11/21 0701 - 11/22 0700   I.V. (mL/kg) 856.5 (11.4) 37 (0.5)   Other 50    NG/GT  1370 80   IV Piggyback 550    Total Intake(mL/kg) 2826.5 (37.5) 117 (1.6)   Urine (mL/kg/hr) 130 (0.1)    Stool 300 (0.2)    Total Output 430     Net +2396.5 +117         PHYSICAL EXAMINATION: General: ill appearing Neuro: RASS -2 HEENT: ETT in place Cardiovascular: irregular, 2/6 systolic click Lungs: scattered rhonchi Abdomen: soft, non tender, more distended Musculoskeletal: 1+ edema Skin: multiple areas of ecchymoses, weeping skin tear RUE  LABS:  CBC  Recent Labs Lab 05/24/14 0550 05/25/14 0351 2014-06-06 0315  WBC 9.9 10.7* 11.4*  HGB 7.6* 7.2* 7.0*  HCT 25.9* 24.1* 23.4*  PLT 312 292 346    BMET  Recent Labs Lab 05/23/14 0507 05/24/14 0550 05/25/14 0351  NA 145 144 149*  K 4.2 4.2 4.8  CL 102 100 103  CO2 28 26 26   BUN 100* 117* 134*  CREATININE 3.05* 3.81* 4.44*  GLUCOSE 128* 133* 118*   Electrolytes  Recent Labs Lab 05/21/14 0455 05/22/14 0550 05/23/14 0507 05/24/14 0550 05/25/14 0351  CALCIUM 9.3 9.3 9.2 9.5 9.1  MG 2.2 2.1  --   --   --   PHOS 4.9* 6.0*  --  7.0*  --     Sepsis Markers  Recent Labs Lab 05/22/14 0550 05/23/14 0507 05/24/14 0550  PROCALCITON 0.41 0.40 0.48   ABG  Recent Labs Lab 05/20/14 2132 05/22/14 0424  PHART 7.413 7.344*  PCO2ART 43.4 53.5*  PO2ART 52.4* 70.2*   Liver Enzymes  Recent Labs Lab 05/20/14 0400 05/24/14 0550  AST 75*  --   ALT 41  --   ALKPHOS 167*  --   BILITOT 0.6  --   ALBUMIN 2.5* 2.5*   Glucose  Recent Labs Lab 05/25/14 1144 05/25/14 1523 05/25/14 1946 05/25/14 2331 06/06/14 0341 06/06/14 0717  GLUCAP 125* 108* 116* 145* 121* 125*    Iron/TIBC/Ferritin/ %Sat    Component Value Date/Time   IRON 38* 05/22/2014 1130   TIBC 320 05/22/2014 1130   FERRITIN 224 05/22/2014 1130   IRONPCTSAT 12* 05/22/2014 1130    Imaging Dg Chest Port 1 View  05/25/2014   CLINICAL DATA:  Acute respiratory failure  EXAM: PORTABLE CHEST - 1 VIEW  COMPARISON:  Portable chest x-ray  of May 24, 2014  FINDINGS: The lungs are adequately inflated. The interstitial markings remain increased diffusely and have become slightly more confluent. The hemidiaphragms remain obscured consistent with pleural effusions. There is no pneumothorax. The cardiopericardial silhouette remains enlarged. Aortic and mitral valve rings are present. There are 7 intact sternal wires. The pulmonary vascularity is engorged and indistinct.  The endotracheal tube tip lies approximately 3.6 cm above the crotch of the carina. The esophagogastric tube tip projects below the inferior margin of the image. The right internal jugular venous catheter tip projects over the midportion of the SVC.  IMPRESSION: Pulmonary interstitial and alveolar edema secondary to CHF with small bilateral pleural effusions. Overall the appearance of the pulmonary interstitium has deteriorated slightly since yesterday's study. The support tubes are in appropriate position.   Electronically Signed   By: David  Martinique   On: 05/25/2014 07:41   ASSESSMENT: ETT 11/10 >> Rt IJ CVL 11/10 >>  Acute on chronic hypoxic respiratory failure 2nd to aspiration with ARDS. Hx of COPD with continue tobacco abuse. A fib with RVR. Acute on chronic diastolic CHF. Hx of HTN, HLD, s/p MVR and AVR in 2003. Hypotension 11/17 >> likely from diuresis. AKI likely 2nd to diuresis >> baseline creatinine 1.41 from 05/20/2014.   Hyponatremia - resolved. GERD. Protein calorie malnutrition. Anemia of critical illness, chronic disease, iron deficiency. Epistaxis >> resolved. Aspiration PNA with ARDS. Increased respiratory secretions 11/17. Acute metabolic encephalopathy 2nd to respiratory failure. Hx of ETOH abuse.  PLAN:  Continue full vent support for now DNR, no escalation of care Defer further lab, imaging studies Continue tube feeds, current meds for now  SUMMARY: Updated pt's POA over the phone this AM.  Explained that he is in dying process.  Have  recommend to shift focus of care to comfort measures >> she is agreeable to this, but would like to inform remainder of family.  Will continue current Tx for now with no escalation of care.  I have advised that best option at this point is to proceed with extubation, and implement comfort measures >> family will inform medical team when they are ready to make this transition.  Chesley Mires, MD [facility] Pulmonary/Critical Care 06/06/2014, 9:17 AM Pager:  [phone] After 3pm call: [phone]

## 2014-06-05 NOTE — Progress Notes (Signed)
Patient Name: [name] Date of Encounter: 06/20/2014  Principal Problem:   Epistaxis, recurrent Active Problems:   Dyslipidemia   HYPONATREMIA, CHRONIC   COPD (chronic obstructive pulmonary disease)   MITRAL VALVE REPLACEMENT, HX OF   S/P AVR   Chronic anticoagulation   Coronary atherosclerosis   Hypertension   Acute on chronic diastolic CHF (congestive heart failure), NYHA class 4   Atrial fibrillation   Acute-on-chronic respiratory failure   Epistaxis   CHF (congestive heart failure)   Stage III chronic kidney disease   Malnutrition of moderate degree   Acute respiratory failure   Altered mental status   Congestive heart disease   Acute respiratory acidosis   ARDS (adult respiratory distress syndrome)   History of ETT   Length of Stay: 13  SUBJECTIVE  Transitioning to comfort measures due to intractable respiratory failure. AF with ventricular rate around 110, but now hypotensive and unable to administer beta blocker. No bleeding.  CURRENT MEDS . antiseptic oral rinse  7 mL Mouth Rinse QID  . atorvastatin  80 mg Per Tube Daily  . chlorhexidine  15 mL Mouth Rinse BID  . feeding supplement (VITAL HIGH PROTEIN)  1,000 mL Per Tube Q24H  . ferrous sulfate  300 mg Oral Q12H  . folic acid  1 mg Per Tube Daily  . ipratropium-albuterol  3 mL Nebulization Q6H  . metoprolol tartrate  50 mg Per Tube BID  . multivitamin  5 mL Per Tube Daily  . pantoprazole sodium  40 mg Per Tube BID  . sodium chloride  3 mL Intravenous Q12H  . thiamine  100 mg Per Tube Daily    OBJECTIVE   Intake/Output Summary (Last 24 hours) at 06/20/2014 0805 Last data filed at 06/20/2014 0600  Gross per 24 hour  Intake 2741.66 ml  Output    430 ml  Net 2311.66 ml   Filed Weights   05/24/14 0500 05/25/14 0409 2014-06-20 0352  Weight: 163 lb 2.3 oz (74 kg) 163 lb 5.8 oz (74.1 kg) 166 lb 3.6 oz (75.4 kg)    PHYSICAL EXAM Filed Vitals:   Jun 20, 2014 0500 06/20/14 0600 06/20/2014 0700 20-Jun-2014  0757  BP: 73/51 84/45 97/54    Pulse: 111 64 110   Temp: 97.9 F (36.6 C) 97.9 F (36.6 C) 98.1 F (36.7 C)   TempSrc: Core (Comment)     Resp: 25 30 30    Height:      Weight:      SpO2: 93% 94% 98% 93%   General: sedated , intubated Head: no evidence of trauma, PERRL, EOMI, no exophtalmos or lid lag, no myxedema, no xanthelasma; normal ears, nose and oropharynx Neck: normal jugular venous pulsations and no hepatojugular reflux; brisk carotid pulses without delay and no carotid bruits Chest: clear to auscultation, no signs of consolidation by percussion or palpation, normal fremitus, symmetrical and full respiratory excursions Cardiovascular: normal position and quality of the apical impulse, regular rhythm, normal first and second heart sounds, no rubs or gallops, crisp prosthetic valve clicks, 4-8/5 aortic ejection murmur is early peaking no diastolic murmur Abdomen: no tenderness or distention, no masses by palpation, no abnormal pulsatility or arterial bruits, normal bowel sounds, no hepatosplenomegaly Extremities: no clubbing, cyanosis or edema; 2+ radial, ulnar and brachial pulses bilaterally; 2+ right femoral, posterior tibial and dorsalis pedis pulses; 2+ left femoral, posterior tibial and dorsalis pedis pulses; no subclavian or femoral bruits Neurological: grossly nonfocal  LABS  CBC  Recent Labs  05/25/14 0351 June 20, 2014  0315  WBC 10.7* 11.4*  HGB 7.2* 7.0*  HCT 24.1* 23.4*  MCV 94.5 94.7  PLT 292 119   Basic Metabolic Panel  Recent Labs  05/24/14 0550 05/25/14 0351  NA 144 149*  K 4.2 4.8  CL 100 103  CO2 26 26  GLUCOSE 133* 118*  BUN 117* 134*  CREATININE 3.81* 4.44*  CALCIUM 9.5 9.1  PHOS 7.0*  --    Liver Function Tests  Recent Labs  05/24/14 0550  ALBUMIN 2.5*   Thyroid Function Tests No results for input(s): TSH, T4TOTAL, T3FREE, THYROIDAB in the last 72 hours.  Invalid input(s): [place]  Radiology Studies Imaging results have been reviewed  and Dg Chest Port 1 View  05/25/2014   CLINICAL DATA:  Acute respiratory failure  EXAM: PORTABLE CHEST - 1 VIEW  COMPARISON:  Portable chest x-ray of May 24, 2014  FINDINGS: The lungs are adequately inflated. The interstitial markings remain increased diffusely and have become slightly more confluent. The hemidiaphragms remain obscured consistent with pleural effusions. There is no pneumothorax. The cardiopericardial silhouette remains enlarged. Aortic and mitral valve rings are present. There are 7 intact sternal wires. The pulmonary vascularity is engorged and indistinct.  The endotracheal tube tip lies approximately 3.6 cm above the crotch of the carina. The esophagogastric tube tip projects below the inferior margin of the image. The right internal jugular venous catheter tip projects over the midportion of the SVC.  IMPRESSION: Pulmonary interstitial and alveolar edema secondary to CHF with small bilateral pleural effusions. Overall the appearance of the pulmonary interstitium has deteriorated slightly since yesterday's study. The support tubes are in appropriate position.   Electronically Signed   By: David  Martinique   On: 05/25/2014 07:41    TELE AF with RVR   ASSESSMENT AND PLAN Dismal prognosis due to ventilator dependent acute on chronic respiratory failure. Echo shows evidence of acute on chronic cor pulmonale due to respiratory failure, preserved LVEF and normal prosthetic valve function. No clinical signs of prosthetic valve dysfunction. Amiodarone IV can be used in place of beta blocker for rate control, but I think this would be a significant escalation in care. Will sign off for now, but if the care plan evolves will gladly see him again.   Sanda Klein, MD, [facility] [phone] office [phone] pager 06/24/14 8:05 AM

## 2014-06-05 NOTE — Progress Notes (Signed)
Respiratory Therapist completed Guideline for Withdrawal of Life-Sustaining Treatment. PT is currently extubated on RA and current RR 4, HR 42, last BP 84/41. Family and RN at bedside.

## 2014-06-05 NOTE — Progress Notes (Signed)
Post mortem care given to patient.  [facility] called earlier.  Kyrie Fludd Roselie Awkward RN

## 2014-06-05 NOTE — Progress Notes (Signed)
Jacklynn Lewis RN and Lacinda Axon RN wasted 20 cc Ativan, and 52 cc Morphine in sink.  Ellisha Bankson Roselie Awkward RN

## 2014-06-05 NOTE — Progress Notes (Signed)
Patient with no spont resp. No pulse, no BP, and no ausculation of heartbeat.  Pronounced per 2 RN's Jacklynn Lewis RN and Quinette Hentges Roselie Awkward RN. Family at bedside and aware that patient has passed.  Support given to family per RN and Chaplain.

## 2014-06-05 NOTE — Progress Notes (Addendum)
Chaplain paged to care for family of pt who will be weaned from vent. Spoke with family and provided initial spiritual care. Wean postponed for an hour. Family members gathered at bedside for a prayer to ask God to take control and guide [name] in the direction that God wished. Individuals requested private prayers for themselves.  Chaplain present with family when vent was withdrawn. Comforted family while [name] breathed his last breaths. Was with family at 5 when he died. Sister in law, who was the MPA gave nurses funeral home information. Family began to depart shortly thereafter.  Sallee Lange. Danyel Tobey, DMin, MDiv, MA Chaplain

## 2014-06-05 NOTE — Procedures (Signed)
Extubation Procedure Note  Patient Details:   Name: GAYLAND NICOL DOB: 1946-01-13 MRN: [id number]   Airway Documentation:  AIRWAYS 8 mm (Active)     Airway 8 mm (Active)  Secured at (cm) 22 cm 06-22-2014  7:59 AM  Measured From Lips 06-22-14  7:59 AM  Secured Location Right 06-22-14  4:17 AM  Secured By [employer] 06-22-2014  7:59 AM  Tube Holder Repositioned Yes 22-Jun-2014  7:59 AM  Cuff Pressure (cm H2O) 24 cm H2O 05/25/2014  7:21 PM  Site Condition Dry 05/24/2014  3:56 PM    Evaluation  O2 sats: transiently fell during during procedure Complications: No apparent complications Patient did tolerate procedure well. Bilateral Breath Sounds: Diminished Suctioning: Oral, Airway No  Baldwin [place] Nannette 2014-06-22, 1:01 PM

## 2014-06-05 DEATH — deceased

## 2014-06-13 ENCOUNTER — Ambulatory Visit: Payer: Medicare Other | Admitting: Cardiovascular Disease

## 2014-06-14 ENCOUNTER — Encounter (HOSPITAL_COMMUNITY): Payer: Self-pay | Admitting: Cardiovascular Disease

## 2015-11-05 IMAGING — NM NM MYOCAR SINGLE W/SPECT W/WALL MOTION & EF
2 series · 12 of 12 positions shown · non-contrast
Comparison: None.

CLINICAL DATA: 67-year-old male with history of CAD status post
CABG, valvular heart disease status post AVR and MVR, subsequent
percutaneous intervention to the RCA in January 2013, diastolic heart
failure, and atrial fibrillation. This study is requested to
evaluate for the presence and extent of ischemia.

EXAM:
MYOCARDIAL IMAGING WITH SPECT (REST AND PHARMACOLOGIC-STRESS)
GATED LEFT VENTRICULAR WALL MOTION STUDY
LEFT VENTRICULAR EJECTION FRACTION
TECHNIQUE: Standard myocardial SPECT imaging was performed after resting
intravenous injection of 10 mCi 0c-55m sestamibi. Subsequently,
intravenous infusion of Lexiscan was performed under the supervision
of the Cardiology staff. At peak effect of the drug, 30 mCi 0c-55m
sestamibi was injected intravenously and standard myocardial SPECT
imaging was performed. Quantitative gated imaging was also performed
to evaluate left ventricular wall motion, and estimate left
ventricular ejection fraction.

[cr cardiac tc low dose · 6.41mm/px · 6 of 64 frames shown]
[frame 6/64]
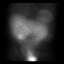
[frame 16/64]
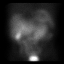
[frame 27/64]
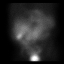
[frame 38/64]
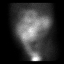
[frame 48/64]
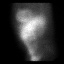
[frame 59/64]
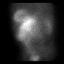

[cs cardiac tc hi dose · 6.41mm/px · 6 of 512 frames shown]
[frame 43/512]
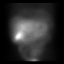
[frame 128/512]
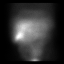
[frame 214/512]
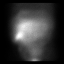
[frame 299/512]
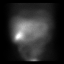
[frame 384/512]
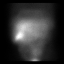
[frame 470/512]
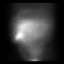

[12 of 12 positions shown; findings below may reference images not displayed]

FINDINGS: Baseline tracing shows normal sinus rhythm with increased voltage
and diffuse nonspecific ST-T wave abnormalities, vertical axis.
Lexiscan bolus was given in standard fashion. Heart rate increased
from 69 beats per min up to 81 beats per min, and blood pressure
remained stable at 184/68. No chest pain was reported. Nondiagnostic
ST-T wave changes were noted during infusion, rare PVC noted as
well. No sustained arrhythmias.

Analysis of the overall perfusion data finds adequate radiotracer
uptake with diaphragmatic attenuation.

Tomographic views were obtained using the short axis, vertical long
axis, and horizontal long axis planes. There is a moderate-sized,
mild to moderate intensity, reversible defect in the lateral wall
that is consistent with ischemia. There is also a small, mild
intensity, partially reversible inferior wall defect that is
consistent with scar and associated mild peri-infarct ischemia.

Gated imaging reveals an EDV of 147, ESV of 90, TID ratio 0.99, and
LVEF calculated at 39% with inferoposterior hypokinesis.
IMPRESSION: Intermediate risk Lexiscan Cardiolite. Nondiagnostic ST segment
changes were noted, rare PVCs without sustained arrhythmia. Sinus
rhythm was present throughout. Perfusion imaging is consistent with
ischemia within the lateral wall as outlined, combination of scar
and mild peri-infarct ischemia in the inferior wall. LVEF 39% with
inferoposterior hypokinesis and overall normal LV volume. This is
consistent with ischemic heart disease and associated
cardiomyopathy.

## 2015-12-30 IMAGING — CR DG CHEST 1V PORT
1 series · 1 of 1 positions shown · non-contrast
Comparison: 08/14/2013

CLINICAL DATA: Epistaxis.  COPD.  Hypertension.

EXAM:
PORTABLE CHEST - 1 VIEW

[portable]
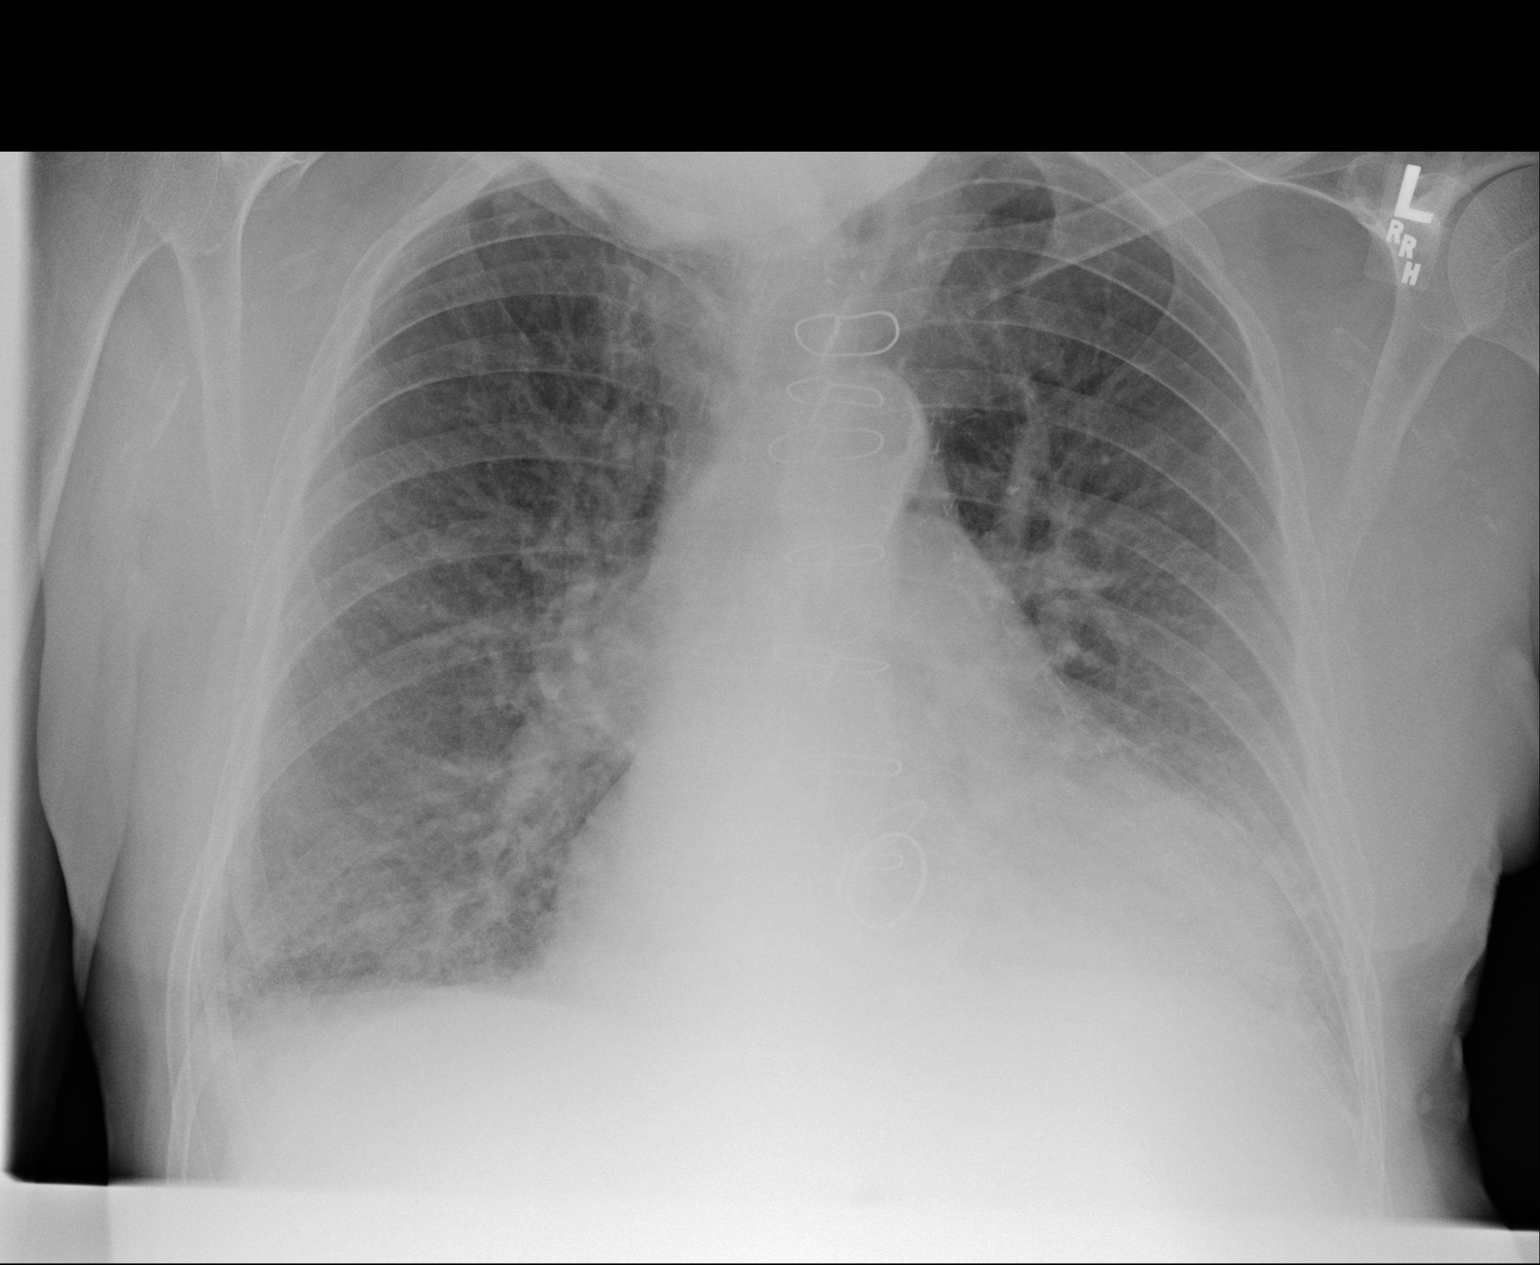

[1 of 1 positions shown; findings below may reference images not displayed]

FINDINGS: Previous median sternotomy, mitral valve replacement and aortic
valve replacement. Chronic cardiomegaly. Pulmonary venous
hypertension with mild interstitial edema, less than was seen
previously. No significant effusion. No focal pulmonary abnormality.
IMPRESSION: Previous AVR and MVR. Cardiomegaly. Venous hypertension with mild
interstitial edema, less than was seen in [REDACTED].
